# Patient Record
Sex: Male | Born: 1950 | Race: White | Hispanic: No | Marital: Married
Health system: Midwestern US, Community
[De-identification: ages and names within clinical notes are randomized; demographics above are authoritative.]

## PROBLEM LIST (undated history)

## (undated) DIAGNOSIS — R369 Urethral discharge, unspecified: Secondary | ICD-10-CM

## (undated) DIAGNOSIS — K529 Noninfective gastroenteritis and colitis, unspecified: Secondary | ICD-10-CM

## (undated) DIAGNOSIS — I509 Heart failure, unspecified: Secondary | ICD-10-CM

## (undated) DIAGNOSIS — C801 Malignant (primary) neoplasm, unspecified: Secondary | ICD-10-CM

## (undated) DIAGNOSIS — E119 Type 2 diabetes mellitus without complications: Secondary | ICD-10-CM

## (undated) DIAGNOSIS — L03031 Cellulitis of right toe: Secondary | ICD-10-CM

## (undated) DIAGNOSIS — C140 Malignant neoplasm of pharynx, unspecified: Secondary | ICD-10-CM

## (undated) DIAGNOSIS — I4892 Unspecified atrial flutter: Secondary | ICD-10-CM

## (undated) DIAGNOSIS — R360 Urethral discharge without blood: Secondary | ICD-10-CM

## (undated) HISTORY — PX: CORONARY ARTERY BYPASS GRAFT: SHX141

## (undated) HISTORY — DX: Malignant neoplasm of pharynx, unspecified: C14.0

## (undated) HISTORY — DX: Cellulitis of right toe: L03.031

## (undated) HISTORY — DX: Type 2 diabetes mellitus without complications: E11.9

## (undated) HISTORY — PX: TOOTH EXTRACTION: SUR596

## (undated) HISTORY — DX: Heart failure, unspecified: I50.9

---
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
## 1968-02-01 HISTORY — PX: APPENDECTOMY: SHX54

## 2015-02-01 DIAGNOSIS — I2510 Atherosclerotic heart disease of native coronary artery without angina pectoris: Secondary | ICD-10-CM

## 2015-02-01 HISTORY — DX: Atherosclerotic heart disease of native coronary artery without angina pectoris: I25.10

## 2017-06-13 ENCOUNTER — Ambulatory Visit: Admit: 2017-06-13 | Discharge: 2017-06-13 | Payer: MEDICARE | Attending: Family Medicine | Primary: Family Medicine

## 2017-06-13 DIAGNOSIS — I482 Chronic atrial fibrillation, unspecified: Secondary | ICD-10-CM

## 2017-06-13 NOTE — Progress Notes (Signed)
Elkhorn Wallis Mart, M.D.  Family Medicine  [address] [place] [address]  Office : [phone]  Fax : [phone]    06/13/2017    Subjective:  The patient is a 67 y.o. male  who presents to establish care.    Patient Active Problem List   Diagnosis Code   ??? Controlled type 2 diabetes mellitus without complication, without long-term current use of insulin ([place]) E11.9   ??? Chronic atrial fibrillation (HCC) I48.2   ??? Coronary artery disease involving native coronary artery of native heart without angina pectoris I25.10   ??? History of MI (myocardial infarction) I25.2   ??? History of anemia Z86.2   ??? Essential hypertension I10   ??? Muscle weakness M32.67     67 year old male with past medical history significant for diabetes, atrial fibrillation, coronary artery disease status post stent placement April 27, 2017 presents today to establish care.  Patient was in a inpatient rehab in [place] for approximately 1 month prior to moving to [place].  Patient endorses that he has been compliant on all prescribed medications and denies any adverse reactions.    Diabetes: During inpatient hospitalization records show insulin dependence requiring 16 units of basal and 3 units with meals however, patient reports that this regimen has been discontinued to continue taking glipizide, Jardiance.  Patient does not check blood sugars.  Timing of condition is constant.  Relieved with prescription medications.    Coronary artery disease: Status post 5 stent placements (patient reported) earlier this year in March.  Patient endorses consistently taking Plavix, aspirin, metoprolol.  Patient denies any chest pain, shortness of breath, palpitations.  Atrial fibrillation: Patient endorses consistently taking prescribed amiodarone, metoprolol.  Patient denies any palpitations, shortness of breath.    HTN:  Patient does report compliance with his prescribed medications.   Patient does not check BP at home, average BP is w/in goal.  Denies chest pain, shortness of breath, dyspnea on exertion, or edema; no vision changes or neurologic symptoms reported.    Weakness: Patient reports muscle weakness primarily characterize/described as early fatigue with exertion in lower extremities.  Patient still ambulating without difficulty.  Patient drove himself to office visit day without problems.  Denies any numbness/tingling/deficit or recent injury/fall/trauma.    Past Surgical History:   Procedure Laterality Date   ??? CARDIAC SURG PROCEDURE UNLIST     ??? HX APPENDECTOMY     ??? HX HERNIA REPAIR     ??? HX ORTHOPAEDIC          History reviewed. No pertinent family history.     reports that he has never smoked. He has never used smokeless tobacco. He reports that he drinks alcohol. He reports that he does not use drugs.    No Known Allergies  3/28... In rehab for on emonth s/p 5 bipas        Review of Systems:     Review of Systems   Constitutional: Positive for malaise/fatigue.   Respiratory: Negative for cough and shortness of breath.    Cardiovascular: Negative for chest pain and palpitations.   Gastrointestinal: Negative for diarrhea, nausea and vomiting.   Skin: Negative for rash.        Objective:  Visit Vitals  BP 145/90 (BP 1 Location: Left arm, BP Patient Position: Sitting)   Pulse (!) 56   Temp 95.5 ??F (35.3 ??C) (Oral)   Ht 6\' 2"  (1.88 m)   Wt 231 lb (104.8  kg)   BMI 29.66 kg/m??       Physical Exam:  Physical Exam   Constitutional: He is oriented to person, place, and time. He appears well-developed and well-nourished.   HENT:   Head: Normocephalic and atraumatic.   Eyes: Pupils are equal, round, and reactive to light. EOM are normal.   Neck: Normal range of motion.   Cardiovascular: Normal rate, regular rhythm and normal heart sounds.   Pulmonary/Chest: Effort normal and breath sounds normal. No respiratory distress.    Abdominal: Soft. Bowel sounds are normal. There is no tenderness. There is no rebound and no guarding.   Musculoskeletal: Normal range of motion.   Neurological: He is alert and oriented to person, place, and time. No cranial nerve deficit. He exhibits normal muscle tone. Coordination normal.   Skin: No rash noted.   Psychiatric: He has a normal mood and affect. His behavior is normal. Thought content normal.       ASSESSMENT/PLAN:  Diagnoses and all orders for this visit:    1. Chronic atrial fibrillation (HCC)  -     REFERRAL TO CARDIOLOGY  -     COLLECTION VENOUS BLOOD,VENIPUNCTURE    2. Coronary artery disease involving native coronary artery of native heart without angina pectoris  -     REFERRAL TO CARDIOLOGY  -     LIPID PANEL    3. Essential hypertension  -     METABOLIC PANEL, COMPREHENSIVE  -     LIPID PANEL    4. Controlled type 2 diabetes mellitus without complication, without long-term current use of insulin ([place])  Assessment & Plan:  Will assess diabetic control with A1c today.  Continue current regimen for now.  Concerning patient's reported discontinuation of insulin dosing after discharge from rehab facility.  Key Antihyperglycemic Medications             glipiZIDE (GLUCOTROL) 10 mg tablet (Taking)     empagliflozin (JARDIANCE) 25 mg tablet (Taking) Take  by mouth daily.        Other Key Diabetic Medications             atorvastatin (LIPITOR) 40 mg tablet (Taking) Take  by mouth daily.        No results found for: HBA1C, HBA1CPOC, HBA1CEXT, GLU, CREA, CREAPOC, CREATEXT, MALBUR, MCALPOCT, MCACRPOC, MALBCRRATEXT, MCACR, MCAU, MCAU2, MALBEXT, CHOL, CHOLPOCT, HDL, HDLPOC, LDLC, LDL, LDLCEXT, LDLCPOC, TRIGL, TGLPOCT, HBA1CEXT  Diabetic Foot and Eye Exam HM Status   Topic Date Due   ??? Diabetic Foot Care  05/02/1958   ??? Eye Exam  [birth year]/02/1960       Orders:  -     HEMOGLOBIN A1C W/O EAG  -     METABOLIC PANEL, COMPREHENSIVE  -     LIPID PANEL    5. History of anemia  -     CBC WITH AUTOMATED DIFF     6. History of MI (myocardial infarction)  Assessment & Plan:  Will refer to cardiology for continuing management/monitoring of coronary artery disease, chronic atrial fibrillation.    Orders:  -     REFERRAL TO CARDIOLOGY    7. Muscle weakness  Assessment & Plan:  Will refer to physical therapy for muscle strengthening/stretching/exercise.  Additionally, obtain baseline labs including assessment for anemia (history of), A1c, kidney/elect lites.      Orders:  -     REFERRAL TO PHYSICAL THERAPY      Follow-up and Dispositions    ?? Return in about  3 months (around 09/13/2017).           Youlanda Roys. Wallis Mart, MD

## 2017-06-13 NOTE — Assessment & Plan Note (Signed)
Will assess diabetic control with A1c today.  Continue current regimen for now.  Concerning patient's reported discontinuation of insulin dosing after discharge from rehab facility.  Key Antihyperglycemic Medications             glipiZIDE (GLUCOTROL) 10 mg tablet (Taking)     empagliflozin (JARDIANCE) 25 mg tablet (Taking) Take  by mouth daily.        Other Key Diabetic Medications             atorvastatin (LIPITOR) 40 mg tablet (Taking) Take  by mouth daily.        No results found for: HBA1C, HBA1CPOC, HBA1CEXT, GLU, CREA, CREAPOC, CREATEXT, MALBUR, MCALPOCT, MCACRPOC, MALBCRRATEXT, MCACR, MCAU, MCAU2, MALBEXT, CHOL, CHOLPOCT, HDL, HDLPOC, LDLC, LDL, LDLCEXT, LDLCPOC, TRIGL, TGLPOCT, HBA1CEXT  Diabetic Foot and Eye Exam HM Status   Topic Date Due   ??? Diabetic Foot Care  05/02/1958   ??? Eye Exam  [birth year]/02/1960

## 2017-06-13 NOTE — Assessment & Plan Note (Signed)
Will refer to physical therapy for muscle strengthening/stretching/exercise.  Additionally, obtain baseline labs including assessment for anemia (history of), A1c, kidney/elect lites.

## 2017-06-13 NOTE — Progress Notes (Signed)
Patient notified of the following;  A1C has come back at 7.9.  Will not be making any changes to current diabetic medications as many of your medications are new.  Will reasess in 3 months.  Patients metabolic panel, lipid panel, blood counts look reassuring/good.

## 2017-06-13 NOTE — Telephone Encounter (Signed)
Upon checking the patient out today he stated he forgot to mention to you that he has had some swelling in his right foot.

## 2017-06-13 NOTE — Assessment & Plan Note (Signed)
Will refer to cardiology for continuing management/monitoring of coronary artery disease, chronic atrial fibrillation.

## 2017-06-14 LAB — LIPID PANEL
Cholesterol, total: 119 mg/dL (ref 100–199)
HDL Cholesterol: 35 mg/dL — ABNORMAL LOW (ref >39)
LDL, calculated: 66 mg/dL (ref 0–99)
Triglyceride: 92 mg/dL (ref 0–149)
VLDL, calculated: 18 mg/dL (ref 5–40)

## 2017-06-14 LAB — METABOLIC PANEL, COMPREHENSIVE
A-G Ratio: 1.5 (ref 1.2–2.2)
ALT (SGPT): 39 IU/L (ref 0–44)
AST (SGOT): 23 IU/L (ref 0–40)
Albumin: 4.4 g/dL (ref 3.6–4.8)
Alk. phosphatase: 155 IU/L — ABNORMAL HIGH (ref 39–117)
BUN/Creatinine ratio: 18 (ref 10–24)
BUN: 22 mg/dL (ref 8–27)
Bilirubin, total: 0.5 mg/dL (ref 0.0–1.2)
CO2: 23 mmol/L (ref 20–29)
Calcium: 9.8 mg/dL (ref 8.6–10.2)
Chloride: 100 mmol/L (ref 96–106)
Creatinine: 1.24 mg/dL (ref 0.76–1.27)
GFR est AA: 68 mL/min/{1.73_m2} (ref >59)
GFR est non-AA: 59 mL/min/{1.73_m2} — ABNORMAL LOW (ref >59)
GLOBULIN, TOTAL: 2.9 g/dL (ref 1.5–4.5)
Glucose: 115 mg/dL — ABNORMAL HIGH (ref 65–99)
Potassium: 4.5 mmol/L (ref 3.5–5.2)
Protein, total: 7.3 g/dL (ref 6.0–8.5)
Sodium: 142 mmol/L (ref 134–144)

## 2017-06-14 LAB — HEMOGLOBIN A1C W/O EAG: Hemoglobin A1c: 7.9 % — ABNORMAL HIGH (ref 4.8–5.6)

## 2017-06-14 LAB — CBC WITH AUTOMATED DIFF
ABS. BASOPHILS: 0 10*3/uL (ref 0.0–0.2)
ABS. EOSINOPHILS: 0.3 10*3/uL (ref 0.0–0.4)
ABS. IMM. GRANS.: 0 10*3/uL (ref 0.0–0.1)
ABS. MONOCYTES: 0.5 10*3/uL (ref 0.1–0.9)
ABS. NEUTROPHILS: 3.5 10*3/uL (ref 1.4–7.0)
Abs Lymphocytes: 0.9 10*3/uL (ref 0.7–3.1)
BASOPHILS: 1 %
EOSINOPHILS: 6 %
HCT: 42.7 % (ref 37.5–51.0)
HGB: 13.7 g/dL (ref 13.0–17.7)
IMMATURE GRANULOCYTES: 0 %
Lymphocytes: 17 %
MCH: 28.4 pg (ref 26.6–33.0)
MCHC: 32.1 g/dL (ref 31.5–35.7)
MCV: 88 fL (ref 79–97)
MONOCYTES: 9 %
NEUTROPHILS: 67 %
PLATELET: 250 10*3/uL (ref 150–379)
RBC: 4.83 x10E6/uL (ref 4.14–5.80)
RDW: 14.5 % (ref 12.3–15.4)
WBC: 5.2 10*3/uL (ref 3.4–10.8)

## 2017-06-14 NOTE — Telephone Encounter (Signed)
Spoke with pt and he stated that the foot swelling had resolved and he no longer needed to discuss the issue. Pt was notified that if the swelling came back to call and let us know so we could schedule him an apt. Pt voiced understanding and was appreciative of call

## 2017-06-20 ENCOUNTER — Ambulatory Visit
Admit: 2017-06-20 | Discharge: 2017-06-20 | Payer: MEDICARE | Attending: Cardiovascular Disease | Primary: Family Medicine

## 2017-06-20 ENCOUNTER — Ambulatory Visit: Attending: Cardiovascular Disease | Primary: Family Medicine

## 2017-06-20 DIAGNOSIS — I252 Old myocardial infarction: Secondary | ICD-10-CM

## 2017-06-20 MED ORDER — SILDENAFIL 20 MG TAB
20 mg | ORAL_TABLET | Freq: Every day | ORAL | 11 refills | Status: AC | PRN
Start: 2017-06-20 — End: ?

## 2017-06-20 NOTE — Progress Notes (Signed)
[facility]  [address], [address]  [place] [address]  PHONE: [phone]    NAME: Jermaine Romero  DOB: 01-12-1951   HPI  Jermaine Romero is a 67 y.o. male seen for a follow up visit regarding   Referral / Consult (Chronic Atrial Fribrillation [facility]))     He is here in f/u on his CAD.  He has a history of CAD with NSTEMI earlier this year. He ended up with an NSTEMI in [place] and ended up with CABG at [facility] .  He has been doing pretty well since his CABG. He has been doing some walking for exercise.  He has been limited by finances and apparently has an outstanding traffic ticket in [place]   dates back to just prior to his surgery .         Past Medical History, Past Surgical History, Family history, Social History, and Medications were all reviewed with the patient today and updated as necessary.     Outpatient Medications Marked as Taking for the 06/20/17 encounter (Office Visit) with Fenton Foy, MD   Medication Sig Dispense Refill   ??? insulin aspart U-100 (NOVOLOG) 100 unit/mL (3 mL) inpn by SubCUTAneous route.     ??? insulin detemir U-100 (LEVEMIR FLEXTOUCH) 100 unit/mL (3 mL) inpn by SubCUTAneous route.     ??? sildenafil, antihypertensive, (REVATIO) 20 mg tablet Take 1 Tab by mouth daily as needed (ed). 1-5 tabs for erectile dysfunction 30 Tab 11   ??? glipiZIDE (GLUCOTROL) 10 mg tablet Take 10 mg by mouth daily.  0   ??? atorvastatin (LIPITOR) 40 mg tablet Take  by mouth daily.     ??? clopidogrel (PLAVIX) 75 mg tab Take 75 mg by mouth nightly.     ??? ondansetron hcl (ZOFRAN) 4 mg tablet Take 4 mg by mouth every eight (8) hours as needed for Nausea.     ??? [DISCONTINUED] metoprolol tartrate (LOPRESSOR) 25 mg tablet Take  by mouth two (2) times a day.       Patient Active Problem List    Diagnosis   ??? Tongue cancer ([place])   ??? Paroxysmal atrial fibrillation (HCC)   ??? Coronary artery disease involving native coronary artery of native heart without angina pectoris    ??? History of MI (myocardial infarction)   ??? History of anemia   ??? Essential hypertension   ??? Muscle weakness     No Known Allergies  Past Medical History:   Diagnosis Date   ??? CAD (coronary artery disease)    ??? 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 CARDIAC SURG PROCEDURE UNLIST     ??? HX APPENDECTOMY     ??? HX HERNIA REPAIR     ??? HX ORTHOPAEDIC       No family history on file.   Social History     Tobacco Use   ??? Smoking status: Never Smoker   ??? Smokeless tobacco: Never Used   Substance Use Topics   ??? Alcohol use: Yes     Frequency: Monthly or less     Drinks per session: 1 or 2     Binge frequency: Never   moio        Review of Systems   Constitutional: Negative for weight loss.   HENT: Positive for hearing loss.    Eyes: Negative for blurred vision.   Respiratory: Negative  for shortness of breath.    Cardiovascular: Negative for chest pain and palpitations.   Gastrointestinal: Negative for abdominal pain.   Genitourinary:        Erectile dysfunction     Neurological: Negative for loss of consciousness.             Wt Readings from Last 3 Encounters:   06/20/17 235 lb (106.6 kg)   06/13/17 231 lb (104.8 kg)     BP Readings from Last 3 Encounters:   06/20/17 140/84   06/13/17 145/90       Visit Vitals  BP 140/84   Pulse 63   Ht _0  (1.88 m)   Wt 235 lb (106.6 kg) Comment: with shoes   BMI 30.17 kg/m??         Physical Exam   Constitutional: He appears well-developed and well-nourished.   HENT:   Head: Normocephalic.   Neck: No JVD present. Carotid bruit is not present.   Cardiovascular: Normal rate, regular rhythm and normal heart sounds.   Pulmonary/Chest: Effort normal and breath sounds normal.   Abdominal: Soft.   Musculoskeletal: He exhibits no edema.   Neurological: He is alert.   Skin: Skin is warm and dry.   Psychiatric: He has a normal mood and affect.   Atrial  Rhythm  -First degree A-V block   P:QRS - 1:1, Abnormal P axis, H Rate 63   PRi = 262   -Left axis.    -  Nonspecific T-abnormality.    Low voltage -possible pulmonary disease.     Medical problems and test results were reviewed with the patient today.     No results found for any visits on 06/20/17.      Lab Results   Component Value Date/Time    Hemoglobin A1c 7.9 (H) 06/13/2017 09:12 AM       Lab Results   Component Value Date/Time    WBC 5.2 06/13/2017 09:12 AM    HGB 13.7 06/13/2017 09:12 AM    HCT 42.7 06/13/2017 09:12 AM    PLATELET 250 06/13/2017 09:12 AM    MCV 88 06/13/2017 09:12 AM       Lab Results   Component Value Date/Time    Sodium 142 06/13/2017 09:12 AM    Potassium 4.5 06/13/2017 09:12 AM    Chloride 100 06/13/2017 09:12 AM    CO2 23 06/13/2017 09:12 AM    Glucose 115 (H) 06/13/2017 09:12 AM    BUN 22 06/13/2017 09:12 AM    Creatinine 1.24 06/13/2017 09:12 AM    BUN/Creatinine ratio 18 06/13/2017 09:12 AM    GFR est AA 68 06/13/2017 09:12 AM    GFR est non-AA 59 (L) 06/13/2017 09:12 AM    Calcium 9.8 06/13/2017 09:12 AM       Lab Results   Component Value Date/Time    ALT (SGPT) 39 06/13/2017 09:12 AM    AST (SGOT) 23 06/13/2017 09:12 AM    Alk. phosphatase 155 (H) 06/13/2017 09:12 AM    Bilirubin, total 0.5 06/13/2017 09:12 AM       Lab Results   Component Value Date/Time    Cholesterol, total 119 06/13/2017 09:12 AM    HDL Cholesterol 35 (L) 06/13/2017 09:12 AM    LDL, calculated 66 06/13/2017 09:12 AM    VLDL, calculated 18 06/13/2017 09:12 AM    Triglyceride 92 06/13/2017 09:12 AM         ASSESSMENT and PLAN    Diagnoses and  all orders for this visit:    1. History of MI (myocardial infarction)  Comments:  still very weak from surgery   cannot afford rehab or travel to and from     2. Essential hypertension  -     AMB POC EKG ROUTINE W/ 12 LEADS, INTER & REP  -     ECHO COMPLETE STUDY; Future    3. Coronary artery disease involving native coronary artery of native heart without angina pectoris  -     ECHO COMPLETE STUDY; Future     4. Controlled type 2 diabetes mellitus without complication, without long-term current use of insulin (HCC)    5. Paroxysmal atrial fibrillation (HCC)  Comments:  post op  will stop amiodarone now 2 months out     6. Erectile dysfunction, unspecified erectile dysfunction type  -     sildenafil, antihypertensive, (REVATIO) 20 mg tablet; Take 1 Tab by mouth daily as needed (ed). 1-5 tabs for erectile dysfunction     Thank you very much for your kind referral of this pleasant patient .  Please do not hesitate to call me  [phone] with any questions or suggestions.                Follow-up and Dispositions    ?? Return in about 6 weeks (around 08/01/2017).             AHIJAH DEVERY, MD5/21/2019  4:29 PM

## 2017-06-20 NOTE — Addendum Note (Signed)
Addended by: Alycia Patten on: 06/21/2017 11:08 AM     Modules accepted: Level of Service

## 2017-06-20 NOTE — Addendum Note (Signed)
Addendum  Note by Alycia Patten at 06/20/17 1430                Author: Sueanne Margarita S  Service: --  Author Type: --       Filed: 06/21/17 1108  Encounter Date: 06/20/2017  Status: Signed          Editor: Sueanne Margarita S          Addended by: Alycia Patten on: 06/21/2017 11:08 AM    Modules accepted: Level of Service

## 2017-06-20 NOTE — Progress Notes (Signed)
Progress  Notes by Fenton Foy, MD at 06/20/17 1430                Author: Fenton Foy, MD  Service: --  Author Type: Physician       Filed: 06/20/17 1747  Encounter Date: 06/20/2017  Status: Signed          Editor: Fenton Foy, MD (Physician)                         [facility]   [address], [address]   [place] [address]   PHONE: [phone]      NAME: Jermaine Romero   DOB: 1950/06/30       HPI   Eliyohu Class is a 67 y.o.  male seen for a follow up visit regarding    Referral / Consult (Chronic Atrial Fribrillation [facility]))      He is here in f/u on his CAD.  He has a history of CAD with NSTEMI earlier this year. He ended up with an NSTEMI in [place] and ended up with CABG at [facility] .  He has been doing pretty well  since his CABG. He has been doing some walking for exercise.  He has been limited by finances and apparently has an outstanding traffic ticket in [place]    dates back to just prior to his surgery .            Past Medical History, Past Surgical History, Family history, Social History, and Medications were all reviewed with the patient today and updated as necessary.         Outpatient Medications Marked as Taking for the 06/20/17 encounter (Office Visit) with Fenton Foy, MD          Medication  Sig  Dispense  Refill           ?  insulin aspart U-100 (NOVOLOG) 100 unit/mL (3 mL) inpn  by SubCUTAneous route.         ?  insulin detemir U-100 (LEVEMIR FLEXTOUCH) 100 unit/mL (3 mL) inpn  by SubCUTAneous route.         ?  sildenafil, antihypertensive, (REVATIO) 20 mg tablet  Take 1 Tab by mouth daily as needed (ed). 1-5 tabs for erectile dysfunction  30 Tab  11     ?  glipiZIDE (GLUCOTROL) 10 mg tablet  Take 10 mg by mouth daily.    0     ?  atorvastatin (LIPITOR) 40 mg tablet  Take  by mouth daily.         ?  clopidogrel (PLAVIX) 75 mg tab  Take 75 mg by mouth nightly.         ?  ondansetron hcl (ZOFRAN) 4 mg tablet  Take 4 mg by mouth every eight (8) hours as needed  for Nausea.               ?  [DISCONTINUED] metoprolol tartrate (LOPRESSOR) 25 mg tablet  Take  by mouth two (2) times a day.              Patient Active Problem List          Diagnosis        ?  Tongue cancer ([place])     ?  Paroxysmal atrial fibrillation (HCC)     ?  Coronary artery disease involving native coronary artery of native heart without angina pectoris     ?  History of MI (myocardial infarction)     ?  History of anemia     ?  Essential hypertension        ?  Muscle weakness        No Known Allergies     Past Medical History:        Diagnosis  Date         ?  CAD (coronary artery disease)       ?  Cancer ([place])            Throat and tounge          Past Surgical History:         Procedure  Laterality  Date          ?  CARDIAC SURG PROCEDURE UNLIST         ?  HX APPENDECTOMY         ?  HX HERNIA REPAIR              ?  HX ORTHOPAEDIC            No family history on file.      Social History          Tobacco Use         ?  Smoking status:  Never Smoker     ?  Smokeless tobacco:  Never Used       Substance Use Topics         ?  Alcohol use:  Yes              Frequency:  Monthly or less         Drinks per session:  1 or 2              Binge frequency:  Never     moio            Review of Systems    Constitutional: Negative for weight loss.    HENT: Positive for hearing loss.     Eyes: Negative for blurred vision.    Respiratory: Negative for shortness of breath.     Cardiovascular: Negative for chest pain and palpitations.    Gastrointestinal: Negative for abdominal pain.    Genitourinary:         Erectile dysfunction       Neurological: Negative for loss of consciousness.                       Wt Readings from Last 3 Encounters:        06/20/17  235 lb (106.6 kg)        06/13/17  231 lb (104.8 kg)          BP Readings from Last 3 Encounters:        06/20/17  140/84        06/13/17  145/90           Visit Vitals      BP  140/84     Pulse  63     Ht  '6\' 2"'  (1.88 m)         Wt  235 lb (106.6 kg)  Comment: with  shoes        BMI  30.17 kg/m??              Physical Exam    Constitutional: He appears well-developed and well-nourished.    HENT:    Head: Normocephalic.  Neck: No JVD present. Carotid bruit is not present.    Cardiovascular: Normal rate, regular rhythm and normal heart sounds.    Pulmonary/Chest: Effort normal and breath sounds normal.    Abdominal: Soft.    Musculoskeletal: He exhibits no edema.   Neurological: He is alert.    Skin: Skin is warm and dry.   Psychiatric: He has a normal mood and affect.    Atrial  Rhythm  -First degree A-V block    P:QRS - 1:1, Abnormal P axis, H Rate 63    PRi = 262   -Left axis.     -  Nonspecific T-abnormality.     Low voltage -possible pulmonary disease.       Medical problems and test results were reviewed with the patient today.       No results found for any visits on 06/20/17.           Lab Results         Component  Value  Date/Time            Hemoglobin A1c  7.9 (H)  06/13/2017 09:12 AM             Lab Results         Component  Value  Date/Time            WBC  5.2  06/13/2017 09:12 AM       HGB  13.7  06/13/2017 09:12 AM       HCT  42.7  06/13/2017 09:12 AM       PLATELET  250  06/13/2017 09:12 AM            MCV  88  06/13/2017 09:12 AM             Lab Results         Component  Value  Date/Time            Sodium  142  06/13/2017 09:12 AM       Potassium  4.5  06/13/2017 09:12 AM       Chloride  100  06/13/2017 09:12 AM       CO2  23  06/13/2017 09:12 AM       Glucose  115 (H)  06/13/2017 09:12 AM       BUN  22  06/13/2017 09:12 AM       Creatinine  1.24  06/13/2017 09:12 AM       BUN/Creatinine ratio  18  06/13/2017 09:12 AM       GFR est AA  68  06/13/2017 09:12 AM       GFR est non-AA  59 (L)  06/13/2017 09:12 AM            Calcium  9.8  06/13/2017 09:12 AM             Lab Results         Component  Value  Date/Time            ALT (SGPT)  39  06/13/2017 09:12 AM       AST (SGOT)  23  06/13/2017 09:12 AM       Alk. phosphatase  155 (H)  06/13/2017 09:12 AM             Bilirubin, total  0.5  06/13/2017 09:12 AM             Lab Results  Component  Value  Date/Time            Cholesterol, total  119  06/13/2017 09:12 AM       HDL Cholesterol  35 (L)  06/13/2017 09:12 AM       LDL, calculated  66  06/13/2017 09:12 AM       VLDL, calculated  18  06/13/2017 09:12 AM            Triglyceride  92  06/13/2017 09:12 AM              ASSESSMENT and PLAN      Diagnoses and all orders for this visit:      1. History of MI (myocardial infarction)   Comments:   still very weak from surgery   cannot afford rehab or travel to and from       2. Essential hypertension   -     AMB POC EKG ROUTINE W/ 12 LEADS, INTER & REP   -     ECHO COMPLETE STUDY; Future      3. Coronary artery disease involving native coronary artery of native heart without angina pectoris   -     ECHO COMPLETE STUDY; Future      4. Controlled type 2 diabetes mellitus without complication, without long-term current use of insulin (HCC)      5. Paroxysmal atrial fibrillation (HCC)   Comments:   post op  will stop amiodarone now 2 months out       6. Erectile dysfunction, unspecified erectile dysfunction type   -     sildenafil, antihypertensive, (REVATIO) 20 mg tablet; Take 1 Tab by mouth daily as needed (ed). 1-5 tabs for erectile dysfunction       Thank you very much for your kind referral of this pleasant patient .  Please do not hesitate to call me  [phone] with any questions or suggestions.                               Follow-up and Dispositions      ??  Return in about 6 weeks (around 08/01/2017).                         Fenton Foy, MD   06/20/2017   4:29 PM

## 2017-07-11 ENCOUNTER — Encounter: Payer: MEDICARE | Primary: Family Medicine

## 2017-07-12 MED ORDER — CLOPIDOGREL 75 MG TAB
75 mg | ORAL_TABLET | Freq: Every evening | ORAL | 1 refills | Status: AC
Start: 2017-07-12 — End: ?

## 2017-07-12 MED ORDER — GLIPIZIDE 10 MG TAB
10 mg | ORAL_TABLET | Freq: Every day | ORAL | 1 refills | Status: DC
Start: 2017-07-12 — End: 2017-08-01

## 2017-07-12 MED ORDER — ATORVASTATIN 40 MG TAB
40 mg | ORAL_TABLET | Freq: Every day | ORAL | 1 refills | Status: AC
Start: 2017-07-12 — End: ?

## 2017-07-12 NOTE — Telephone Encounter (Signed)
Informed patient that Dr. Wallis Mart had sent in the prescriptions for the medications requested.

## 2017-07-12 NOTE — Telephone Encounter (Signed)
Patient called requesting refills on the following medications:    Atorvastatin 40mg   Glipizide 10mg   Clopidogrel 75mg .    Patient is newer to Dr. Omer Jack care and has not had these prescriptions written by him previously.    Patient has an appt scheduled with Dr. Wallis Mart on 09/14/2017.

## 2017-07-25 NOTE — Telephone Encounter (Addendum)
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 Pt has appointment to have DOT physical tomorrow.   Please fax to : [phone]

## 2017-07-25 NOTE — Telephone Encounter (Signed)
How is he doing     I need to see him back . He was weak last visit early post surgery  and needed letter saying he could not return to [place] to sit for a parking ticket court case ?

## 2017-07-26 NOTE — Telephone Encounter (Signed)
I called and explained Dr.Cebe's response. He voiced understanding and stated he forgot he had an upcoming appointment on 08/01/17. 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

## 2017-08-01 ENCOUNTER — Ambulatory Visit
Admit: 2017-08-01 | Discharge: 2017-08-01 | Payer: MEDICARE | Attending: Cardiovascular Disease | Primary: Family Medicine

## 2017-08-01 ENCOUNTER — Ambulatory Visit: Attending: Cardiovascular Disease | Primary: Family Medicine

## 2017-08-01 DIAGNOSIS — I2510 Atherosclerotic heart disease of native coronary artery without angina pectoris: Secondary | ICD-10-CM

## 2017-08-01 MED ORDER — RAMIPRIL 10 MG CAP
10 mg | ORAL_CAPSULE | Freq: Every day | ORAL | 11 refills | Status: AC
Start: 2017-08-01 — End: ?

## 2017-08-01 NOTE — Telephone Encounter (Signed)
Pt wanted to come on for appt with dr.cebe.  Said he didn't know anything about echo appt

## 2017-08-01 NOTE — Progress Notes (Signed)
[facility]  [address], [address]  [place] [address]  PHONE: [phone]    NAME: Jermaine Romero  DOB: 01/20/1951   HPI  Jermaine Romero is a 67 y.o. 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 He did not get his echo yet.  He is walking daily and doing a good bit better.  He is joining the [employer].    He is cleared to go back to work   No chest pain or dyspnea.         Past Medical History, Past Surgical History, Family history, Social History, and Medications were all reviewed with the patient today and updated as necessary.     Outpatient Medications Marked as Taking for the 08/01/17 encounter (Office Visit) with Fenton Foy, MD   Medication Sig Dispense Refill   ??? ramipril (ALTACE) 10 mg capsule Take 1 Cap by mouth daily. 30 Cap 11   ??? clopidogrel (PLAVIX) 75 mg tab Take 1 Tab by mouth nightly. 30 Tab 1   ??? atorvastatin (LIPITOR) 40 mg tablet Take 1 Tab by mouth daily. 30 Tab 1   ??? OTHER,NON-FORMULARY, Take 2 Caps by mouth daily. Indications: T5RX Testosterone Booster     ??? OTHER,NON-FORMULARY, Take 2 Caplet by mouth daily. Indications: Extra Emergency planning/management officer Advanced with Kenard Gower     ??? OTHER,NON-FORMULARY, Take 1 Cap by mouth daily. Indications: [employer] Men's Yohimbe 451 Dietary Supplement     ??? OTHER,NON-FORMULARY, Take 2 Caplet by mouth daily. Indications: Nitric Muscle Uptake Maximum Strength Formula     ??? OTHER,NON-FORMULARY, Take 2 Caps by mouth daily. Indications: Perform XT Testosterone Support     ??? OTHER,NON-FORMULARY, Take 2 Caplet by mouth daily. Indications: Super Beta Prostate P3 Advanced Prostate Support Complex     ??? sildenafil, antihypertensive, (REVATIO) 20 mg tablet Take 1 Tab by mouth daily as needed (ed). 1-5 tabs for erectile dysfunction 30 Tab 11     Patient Active Problem List    Diagnosis   ??? Other hyperlipidemia    ??? Tongue cancer ([place])     THROAT CANCER   EXCISION AND RADIATION CHEMO 2017      ??? Hx of CABG     04/18/17  [facility]  Dr Soyla Murphy   CABGx5   atretic LIMA proximal   distal LIMA free graft to LAD via SVG to circumflex pedicle SVG D1 SVG D2 via hood SVG  D1  SVG OM distal SVG LPL   EF50% lateral scar   LAA LIGATION 35MM ATRICLIP      ??? Paroxysmal atrial fibrillation ([place])   ??? Coronary artery disease involving native coronary artery of native heart without angina pectoris   ??? History of MI (myocardial infarction)   ??? History of anemia   ??? Essential hypertension   ??? Muscle weakness     No Known Allergies  Past Medical History:   Diagnosis Date   ??? CAD (coronary artery disease)    ??? Cancer ([place])     Throat and tounge     Past Surgical History:   Procedure Laterality Date   ??? CARDIAC SURG PROCEDURE UNLIST     ??? HX APPENDECTOMY     ??? HX HERNIA REPAIR     ??? HX ORTHOPAEDIC  No family history on file.   Social History     Tobacco Use   ??? Smoking status: Never Smoker   ??? Smokeless tobacco: Never Used   Substance Use Topics   ??? Alcohol use: Yes     Frequency: Monthly or less     Drinks per session: 1 or 2     Binge frequency: Never           ROS          Wt Readings from Last 3 Encounters:   08/01/17 247 lb (112 kg)   06/20/17 235 lb (106.6 kg)   06/13/17 231 lb (104.8 kg)     BP Readings from Last 3 Encounters:   08/01/17 136/90   06/20/17 140/84   06/13/17 145/90       Visit Vitals  BP 136/90   Pulse 72   Ht '6\' 2"'  (1.88 m)   Wt 247 lb (112 kg) Comment: with shoes   BMI 31.71 kg/m??         Physical Exam   Constitutional: He appears well-developed and well-nourished.   HENT:   Head: Normocephalic.   Neck: No JVD present. Carotid bruit is not present.   Cardiovascular: Normal rate, regular rhythm and normal heart sounds.   Pulmonary/Chest: Effort normal and breath sounds normal.   Abdominal: Soft.   Musculoskeletal: He exhibits no edema.   Neurological: He is alert.   Skin: Skin is warm and dry.    Psychiatric: He has a normal mood and affect.       Medical problems and test results were reviewed with the patient today.     No results found for any visits on 08/01/17.      Lab Results   Component Value Date/Time    Hemoglobin A1c 7.9 (H) 06/13/2017 09:12 AM       Lab Results   Component Value Date/Time    WBC 5.2 06/13/2017 09:12 AM    HGB 13.7 06/13/2017 09:12 AM    HCT 42.7 06/13/2017 09:12 AM    PLATELET 250 06/13/2017 09:12 AM    MCV 88 06/13/2017 09:12 AM       Lab Results   Component Value Date/Time    Sodium 142 06/13/2017 09:12 AM    Potassium 4.5 06/13/2017 09:12 AM    Chloride 100 06/13/2017 09:12 AM    CO2 23 06/13/2017 09:12 AM    Glucose 115 (H) 06/13/2017 09:12 AM    BUN 22 06/13/2017 09:12 AM    Creatinine 1.24 06/13/2017 09:12 AM    BUN/Creatinine ratio 18 06/13/2017 09:12 AM    GFR est AA 68 06/13/2017 09:12 AM    GFR est non-AA 59 (L) 06/13/2017 09:12 AM    Calcium 9.8 06/13/2017 09:12 AM       Lab Results   Component Value Date/Time    ALT (SGPT) 39 06/13/2017 09:12 AM    AST (SGOT) 23 06/13/2017 09:12 AM    Alk. phosphatase 155 (H) 06/13/2017 09:12 AM    Bilirubin, total 0.5 06/13/2017 09:12 AM       Lab Results   Component Value Date/Time    Cholesterol, total 119 06/13/2017 09:12 AM    HDL Cholesterol 35 (L) 06/13/2017 09:12 AM    LDL, calculated 66 06/13/2017 09:12 AM    VLDL, calculated 18 06/13/2017 09:12 AM    Triglyceride 92 06/13/2017 09:12 AM         ASSESSMENT and PLAN    Diagnoses and all  orders for this visit:    1. Coronary artery disease involving native coronary artery of native heart without angina pectoris  Comments:  stable with no limitations post CABG.  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    2. Essential hypertension  Comments:  will start ACE -  post op Ramipril   Orders:   -     ramipril (ALTACE) 10 mg capsule; Take 1 Cap by mouth daily.    3. 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                 TARIQ PERNELL, MD7/03/2017  11:42 AM

## 2017-08-01 NOTE — Progress Notes (Signed)
Progress  Notes by Fenton Foy, MD at 08/01/17 1100                Author: Fenton Foy, MD  Service: --  Author Type: Physician       Filed: 08/01/17 1156  Encounter Date: 08/01/2017  Status: Signed          Editor: Fenton Foy, MD (Physician)                         [facility]   [address], [address]   [place] [address]   PHONE: [phone]      NAME: Jermaine Romero   DOB: 09/16/1950       HPI   Jermaine Romero is a 67 y.o.  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 He did not get his echo yet.  He is walking daily and doing a good bit better.  He is joining the [employer].    He is cleared to go back to work    No chest pain or dyspnea.             Past Medical History, Past Surgical History, Family history, Social History, and Medications were all reviewed with the patient today and updated as necessary.         Outpatient Medications Marked as Taking for the 08/01/17 encounter (Office Visit) with Fenton Foy, MD          Medication  Sig  Dispense  Refill           ?  ramipril (ALTACE) 10 mg capsule  Take 1 Cap by mouth daily.  30 Cap  11     ?  clopidogrel (PLAVIX) 75 mg tab  Take 1 Tab by mouth nightly.  30 Tab  1     ?  atorvastatin (LIPITOR) 40 mg tablet  Take 1 Tab by mouth daily.  30 Tab  1     ?  OTHER,NON-FORMULARY,  Take 2 Caps by mouth daily. Indications: T5RX Testosterone Booster         ?  OTHER,NON-FORMULARY,  Take 2 Caplet by mouth daily. Indications: Extra Emergency planning/management officer Advanced with Kenard Gower         ?  OTHER,NON-FORMULARY,  Take 1 Cap by mouth daily. Indications: [employer] Men's Yohimbe 451 Dietary Supplement               ?  OTHER,NON-FORMULARY,  Take 2 Caplet by mouth daily. Indications: Nitric Muscle Uptake Maximum Strength Formula               ?  OTHER,NON-FORMULARY,  Take 2 Caps by mouth daily. Indications: Perform XT  Testosterone Support         ?  OTHER,NON-FORMULARY,  Take 2 Caplet by mouth daily. Indications: Super Beta Prostate P3 Advanced Prostate Support Complex               ?  sildenafil, antihypertensive, (REVATIO) 20 mg tablet  Take 1 Tab by mouth daily as needed (ed). 1-5 tabs for erectile dysfunction  30 Tab  11          Patient Active Problem List          Diagnosis        ?  Other hyperlipidemia     ?  Tongue cancer [facility])             THROAT CANCER   EXCISION AND RADIATION CHEMO 2017            ?  Hx of CABG             04/18/17  [facility]  Dr Soyla Murphy   CABGx5   atretic LIMA proximal   distal LIMA free graft to LAD via SVG to circumflex pedicle SVG D1 SVG D2  via hood SVG  D1  SVG OM distal SVG LPL    EF50% lateral scar   LAA LIGATION 35MM ATRICLIP            ?  Paroxysmal atrial fibrillation ([facility])     ?  Coronary artery disease involving native coronary artery of native heart without angina pectoris     ?  History of MI (myocardial infarction)     ?  History of anemia     ?  Essential hypertension        ?  Muscle weakness        No Known Allergies     Past Medical History:        Diagnosis  Date         ?  CAD (coronary artery disease)       ?  Cancer ([place])            Throat and tounge          Past Surgical History:         Procedure  Laterality  Date          ?  CARDIAC SURG PROCEDURE UNLIST         ?  HX APPENDECTOMY         ?  HX HERNIA REPAIR              ?  HX ORTHOPAEDIC            No family history on file.      Social History          Tobacco Use         ?  Smoking status:  Never Smoker     ?  Smokeless tobacco:  Never Used       Substance Use Topics         ?  Alcohol use:  Yes              Frequency:  Monthly or less         Drinks per session:  1 or 2              Binge frequency:  Never                 ROS                   Wt Readings from Last 3 Encounters:        08/01/17  247 lb (112 kg)     06/20/17  235 lb (106.6 kg)        06/13/17  231 lb (104.8 kg)          BP Readings from Last  3 Encounters:        08/01/17  136/90     06/20/17  140/84        06/13/17  145/90  Visit Vitals      BP  136/90     Pulse  72     Ht  6' 2" (1.88 m)         Wt  247 lb (112 kg)  Comment: with shoes        BMI  31.71 kg/m??              Physical Exam    Constitutional: He appears well-developed and well-nourished.    HENT:    Head: Normocephalic.    Neck: No JVD present. Carotid bruit is not present.    Cardiovascular: Normal rate, regular rhythm and normal heart sounds.    Pulmonary/Chest: Effort normal and breath sounds normal.    Abdominal: Soft.    Musculoskeletal: He exhibits no edema.   Neurological: He is alert.    Skin: Skin is warm and dry.   Psychiatric: He has a normal mood and affect.          Medical problems and test results were reviewed with the patient today.       No results found for any visits on 08/01/17.           Lab Results         Component  Value  Date/Time            Hemoglobin A1c  7.9 (H)  06/13/2017 09:12 AM             Lab Results         Component  Value  Date/Time            WBC  5.2  06/13/2017 09:12 AM       HGB  13.7  06/13/2017 09:12 AM       HCT  42.7  06/13/2017 09:12 AM       PLATELET  250  06/13/2017 09:12 AM            MCV  88  06/13/2017 09:12 AM             Lab Results         Component  Value  Date/Time            Sodium  142  06/13/2017 09:12 AM       Potassium  4.5  06/13/2017 09:12 AM       Chloride  100  06/13/2017 09:12 AM       CO2  23  06/13/2017 09:12 AM       Glucose  115 (H)  06/13/2017 09:12 AM       BUN  22  06/13/2017 09:12 AM       Creatinine  1.24  06/13/2017 09:12 AM       BUN/Creatinine ratio  18  06/13/2017 09:12 AM       GFR est AA  68  06/13/2017 09:12 AM       GFR est non-AA  59 (L)  06/13/2017 09:12 AM            Calcium  9.8  06/13/2017 09:12 AM             Lab Results         Component  Value  Date/Time            ALT (SGPT)  39  06/13/2017 09:12 AM       AST (SGOT)  23  06/13/2017 09:12 AM       Alk. phosphatase  155 (H)  06/13/2017 09:12  AM            Bilirubin, total  0.5  06/13/2017 09:12 AM             Lab Results         Component  Value  Date/Time            Cholesterol, total  119  06/13/2017 09:12 AM       HDL Cholesterol  35 (L)  06/13/2017 09:12 AM       LDL, calculated  66  06/13/2017 09:12 AM       VLDL, calculated  18  06/13/2017 09:12 AM            Triglyceride  92  06/13/2017 09:12 AM              ASSESSMENT and PLAN      Diagnoses and all orders for this visit:      1. Coronary artery disease involving native coronary artery of native heart without angina pectoris   Comments:   stable with no limitations post CABG.  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      2. Essential hypertension   Comments:   will start ACE -  post op Ramipril    Orders:   -     ramipril (ALTACE) 10 mg capsule; Take 1 Cap by mouth daily.      3. 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                            Fenton Foy, MD   08/01/2017   11:42 AM

## 2017-08-08 ENCOUNTER — Ambulatory Visit: Admit: 2017-08-08 | Discharge: 2017-08-08 | Payer: MEDICARE | Attending: Family Medicine | Primary: Family Medicine

## 2017-08-08 ENCOUNTER — Ambulatory Visit: Attending: Family Medicine | Primary: Family Medicine

## 2017-08-08 DIAGNOSIS — I10 Essential (primary) hypertension: Secondary | ICD-10-CM

## 2017-08-08 MED ORDER — EMPAGLIFLOZIN 25 MG TABLET
25 mg | ORAL_TABLET | Freq: Every day | ORAL | 0 refills | Status: AC
Start: 2017-08-08 — End: ?

## 2017-08-08 NOTE — Assessment & Plan Note (Signed)
Uncontrolled.  Unknown compliance this patient is not familiar with current medications.  Will call [facility] pharmacy to reconcile medications.  Additionally, recommended patient take currently prescribed ramipril.  Counseled on salt restriction and diet/exercise.

## 2017-08-08 NOTE — Assessment & Plan Note (Signed)
Will refer to ENT for continued monitoring/evaluation/management.  No alarming findings or historical endorsements noted today.  We will additionally attempt to obtain ENT records

## 2017-08-08 NOTE — Progress Notes (Signed)
[place] Wallis Mart, M.D.  Family Medicine  [address] [place] [address]  Office : [phone]  Fax : [phone]    08/08/2017  Subjective:  The patient is a 67 y.o. male who presents for a problem visit.    Chief Complaint   Patient presents with   ??? Dysphagia     Pt c/o dysphagia, has hx of throat cancer.    ??? Headache     Pt c/o headaches when coughing     Dysphasia: Patient reports over the past several weeks that he has been experiencing dysphagia characterized/described as "food getting stuck".  Patient denies any weight loss, night sweats, throat pain.  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  Timing of patient's concern is intermittent.  Denies any known exacerbating factors.  Has not done or taken anything for relief.    HTN:  Patient does report compliance with his prescribed medications.  Patient does not check BP at home, average BP is not w/in goal.  Denies chest pain, shortness of breath, dyspnea on exertion, or edema; no vision changes or neurologic symptoms reported.  Patient presents today unaware of medications being taken.  Does not know if he has been taking his blood pressure medication.    Otherwise, patient endorses compliant with current medication regimens and denies any adverse reactions.  Patient has no new concerns/complaints today.      Past Medical History:   Diagnosis Date   ??? CAD (coronary artery disease)    ??? Cancer ([place])     Throat and tounge       Social History     Socioeconomic History   ??? Marital status: MARRIED     Spouse name: Not on file   ??? Number of children: Not on file   ??? Years of education: Not on file   ??? Highest education level: Not on file   Occupational History   ??? Not on file   Social Needs   ??? Financial resource strain: Not on file   ??? Food insecurity:     Worry: Not on file     Inability: Not on file   ??? Transportation needs:      Medical: Not on file     Non-medical: Not on file   Tobacco Use   ??? Smoking status: Never Smoker   ??? Smokeless tobacco: Never Used   Substance and Sexual Activity   ??? Alcohol use: Yes     Frequency: Monthly or less     Drinks per session: 1 or 2     Binge frequency: Never   ??? Drug use: Never   ??? Sexual activity: Not on file   Lifestyle   ??? Physical activity:     Days per week: Not on file     Minutes per session: Not on file   ??? Stress: Not on file   Relationships   ??? Social connections:     Talks on phone: Not on file     Gets together: Not on file     Attends religious service: Not on file     Active member of club or organization: Not on file     Attends meetings of clubs or organizations: Not on file     Relationship status: Not on file   ??? Intimate partner violence:     Fear of current  or ex partner: Not on file     Emotionally abused: Not on file     Physically abused: Not on file     Forced sexual activity: Not on file   Other Topics Concern   ??? Not on file   Social History Narrative   ??? Not on file        No Known Allergies    Current Outpatient Medications   Medication Sig Dispense Refill   ??? glipiZIDE (GLUCOTROL) 10 mg tablet Take 10 mg by mouth daily.     ??? empagliflozin (JARDIANCE) 25 mg tablet Take 1 Tab by mouth daily. 30 Tab 0   ??? ramipril (ALTACE) 10 mg capsule Take 1 Cap by mouth daily. 30 Cap 11   ??? clopidogrel (PLAVIX) 75 mg tab Take 1 Tab by mouth nightly. 30 Tab 1   ??? atorvastatin (LIPITOR) 40 mg tablet Take 1 Tab by mouth daily. 30 Tab 1   ??? OTHER,NON-FORMULARY, Take 2 Caps by mouth daily. Indications: T5RX Testosterone Booster     ??? OTHER,NON-FORMULARY, Take 2 Caplet by mouth daily. Indications: Extra Emergency planning/management officer Advanced with Kenard Gower     ??? OTHER,NON-FORMULARY, Take 1 Cap by mouth daily. Indications: [employer] Men's Yohimbe 451 Dietary Supplement     ??? OTHER,NON-FORMULARY, Take 2 Caplet by mouth daily. Indications: Nitric Muscle Uptake Maximum Strength Formula      ??? OTHER,NON-FORMULARY, Take 2 Caps by mouth daily. Indications: Perform XT Testosterone Support     ??? OTHER,NON-FORMULARY, Take 2 Caplet by mouth daily. Indications: Super Beta Prostate P3 Advanced Prostate Support Complex     ??? sildenafil, antihypertensive, (REVATIO) 20 mg tablet Take 1 Tab by mouth daily as needed (ed). 1-5 tabs for erectile dysfunction 30 Tab 11        Review of Systems:       Review of Systems   Constitutional: Negative for malaise/fatigue and weight loss.   Respiratory: Negative for cough and shortness of breath.    Cardiovascular: Negative for chest pain and palpitations.   Gastrointestinal: Negative for abdominal pain, diarrhea, nausea and vomiting.   Skin: Negative for rash.        Objective:  Visit Vitals  BP 161/89 (BP 1 Location: Left arm, BP Patient Position: Sitting)   Pulse 75   Temp 97.1 ??F (36.2 ??C) (Oral)   Ht 6\' 2"  (1.88 m)   Wt 250 lb (113.4 kg)   SpO2 100%   BMI 32.10 kg/m??       Physical Exam:  Physical Exam   Constitutional: He is oriented to person, place, and time. He appears well-developed and well-nourished.   HENT:   Head: Normocephalic and atraumatic.   Eyes: Pupils are equal, round, and reactive to light. EOM are normal.   Neck: Normal range of motion.   Cardiovascular: Normal rate, regular rhythm and normal heart sounds.   Pulmonary/Chest: Effort normal and breath sounds normal. No respiratory distress.   Abdominal: Soft. Bowel sounds are normal. There is no tenderness. There is no rebound and no guarding.   Musculoskeletal: Normal range of motion.   Neurological: He is alert and oriented to person, place, and time.   Skin: No rash noted.   Psychiatric: He has a normal mood and affect. His behavior is normal. Thought content normal.       Assessment/Plan:  Diagnoses and all orders for this visit:    1. Essential hypertension  Assessment & Plan:  Uncontrolled.  Unknown compliance this patient is  not familiar with  current medications.  Will call [facility] pharmacy to reconcile medications.  Additionally, recommended patient take currently prescribed ramipril.  Counseled on salt restriction and diet/exercise.      2. History of throat cancer  Assessment & Plan:  Will refer to ENT for continued monitoring/evaluation/management.  No alarming findings or historical endorsements noted today.  We will additionally attempt to obtain ENT records    Orders:  -     REFERRAL TO ENT-OTOLARYNGOLOGY    3. Tongue cancer ([place])  -     REFERRAL TO ENT-OTOLARYNGOLOGY    4. Type 2 diabetes mellitus without complication, without long-term current use of insulin ([facility])  -     empagliflozin (JARDIANCE) 25 mg tablet; Take 1 Tab by mouth daily.      Follow-up and Dispositions    ?? Return in about 1 week (around 08/15/2017) for med rec (have patient bring in all meds).           Youlanda Roys. Wallis Mart, MD

## 2017-08-08 NOTE — Assessment & Plan Note (Signed)
Uncontrolled.  Unknown compliance this patient is not familiar with current medications.  Will call [facility] pharmacy to reconcile medications.  Additionally, recommended patient take currently prescribed ramipril.  Counseled on salt restriction and diet/exercise.

## 2017-08-08 NOTE — Progress Notes (Signed)
[place] Wallis Mart, M.D.  Family Medicine  [address] [place] [address]  Office : [phone]  Fax : [phone]    08/08/2017  Subjective:  The patient is a 67 y.o. male who presents for a problem visit.    Chief Complaint   Patient presents with   ??? Dysphagia     Pt c/o dysphagia, has hx of throat cancer.    ??? Headache     Pt c/o headaches when coughing     Dysphasia: Patient reports over the past several weeks that he has been experiencing dysphagia characterized/described as "food getting stuck".  Patient denies any weight loss, night sweats, throat pain.  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  Timing of patient's concern is intermittent.  Denies any known exacerbating factors.  Has not done or taken anything for relief.    HTN:  Patient does report compliance with his prescribed medications.  Patient does not check BP at home, average BP is not w/in goal.  Denies chest pain, shortness of breath, dyspnea on exertion, or edema; no vision changes or neurologic symptoms reported.  Patient presents today unaware of medications being taken.  Does not know if he has been taking his blood pressure medication.    Otherwise, patient endorses compliant with current medication regimens and denies any adverse reactions.  Patient has no new concerns/complaints today.      Past Medical History:   Diagnosis Date   ??? CAD (coronary artery disease)    ??? Cancer ([place])     Throat and tounge       Social History     Socioeconomic History   ??? Marital status: MARRIED     Spouse name: Not on file   ??? Number of children: Not on file   ??? Years of education: Not on file   ??? Highest education level: Not on file   Occupational History   ??? Not on file   Social Needs   ??? Financial resource strain: Not on file   ??? Food insecurity:     Worry: Not on file     Inability: Not on file   ??? Transportation needs:     Medical:  Not on file     Non-medical: Not on file   Tobacco Use   ??? Smoking status: Never Smoker   ??? Smokeless tobacco: Never Used   Substance and Sexual Activity   ??? Alcohol use: Yes     Frequency: Monthly or less     Drinks per session: 1 or 2     Binge frequency: Never   ??? Drug use: Never   ??? Sexual activity: Not on file   Lifestyle   ??? Physical activity:     Days per week: Not on file     Minutes per session: Not on file   ??? Stress: Not on file   Relationships   ??? Social connections:     Talks on phone: Not on file     Gets together: Not on file     Attends religious service: Not on file     Active member of club or organization: Not on file     Attends meetings of clubs or organizations: Not on file     Relationship status: Not on file   ??? Intimate partner violence:     Fear of current  or ex partner: Not on file     Emotionally abused: Not on file     Physically abused: Not on file     Forced sexual activity: Not on file   Other Topics Concern   ??? Not on file   Social History Narrative   ??? Not on file        No Known Allergies    Current Outpatient Medications   Medication Sig Dispense Refill   ??? glipiZIDE (GLUCOTROL) 10 mg tablet Take 10 mg by mouth daily.     ??? empagliflozin (JARDIANCE) 25 mg tablet Take 1 Tab by mouth daily. 30 Tab 0   ??? ramipril (ALTACE) 10 mg capsule Take 1 Cap by mouth daily. 30 Cap 11   ??? clopidogrel (PLAVIX) 75 mg tab Take 1 Tab by mouth nightly. 30 Tab 1   ??? atorvastatin (LIPITOR) 40 mg tablet Take 1 Tab by mouth daily. 30 Tab 1   ??? OTHER,NON-FORMULARY, Take 2 Caps by mouth daily. Indications: T5RX Testosterone Booster     ??? OTHER,NON-FORMULARY, Take 2 Caplet by mouth daily. Indications: Extra Emergency planning/management officer Advanced with Kenard Gower     ??? OTHER,NON-FORMULARY, Take 1 Cap by mouth daily. Indications: [employer] Men's Yohimbe 451 Dietary Supplement     ??? OTHER,NON-FORMULARY, Take 2 Caplet by mouth daily. Indications: Nitric Muscle Uptake Maximum Strength Formula     ??? OTHER,NON-FORMULARY, Take 2  Caps by mouth daily. Indications: Perform XT Testosterone Support     ??? OTHER,NON-FORMULARY, Take 2 Caplet by mouth daily. Indications: Super Beta Prostate P3 Advanced Prostate Support Complex     ??? sildenafil, antihypertensive, (REVATIO) 20 mg tablet Take 1 Tab by mouth daily as needed (ed). 1-5 tabs for erectile dysfunction 30 Tab 11        Review of Systems:       Review of Systems   Constitutional: Negative for malaise/fatigue and weight loss.   Respiratory: Negative for cough and shortness of breath.    Cardiovascular: Negative for chest pain and palpitations.   Gastrointestinal: Negative for abdominal pain, diarrhea, nausea and vomiting.   Skin: Negative for rash.        Objective:  Visit Vitals  BP 161/89 (BP 1 Location: Left arm, BP Patient Position: Sitting)   Pulse 75   Temp 97.1 ??F (36.2 ??C) (Oral)   Ht 6\' 2"  (1.88 m)   Wt 250 lb (113.4 kg)   SpO2 100%   BMI 32.10 kg/m??       Physical Exam:  Physical Exam   Constitutional: He is oriented to person, place, and time. He appears well-developed and well-nourished.   HENT:   Head: Normocephalic and atraumatic.   Eyes: Pupils are equal, round, and reactive to light. EOM are normal.   Neck: Normal range of motion.   Cardiovascular: Normal rate, regular rhythm and normal heart sounds.   Pulmonary/Chest: Effort normal and breath sounds normal. No respiratory distress.   Abdominal: Soft. Bowel sounds are normal. There is no tenderness. There is no rebound and no guarding.   Musculoskeletal: Normal range of motion.   Neurological: He is alert and oriented to person, place, and time.   Skin: No rash noted.   Psychiatric: He has a normal mood and affect. His behavior is normal. Thought content normal.       Assessment/Plan:  Diagnoses and all orders for this visit:    1. Essential hypertension  Assessment & Plan:  Uncontrolled.  Unknown compliance this patient is  not familiar with current medications.  Will call [facility] to reconcile medications.   Additionally, recommended patient take currently prescribed ramipril.  Counseled on salt restriction and diet/exercise.      2. History of throat cancer  Assessment & Plan:  Will refer to ENT for continued monitoring/evaluation/management.  No alarming findings or historical endorsements noted today.  We will additionally attempt to obtain ENT records    Orders:  -     REFERRAL TO ENT-OTOLARYNGOLOGY    3. Tongue cancer ([place])  -     REFERRAL TO ENT-OTOLARYNGOLOGY    4. Type 2 diabetes mellitus without complication, without long-term current use of insulin (HCC)  -     empagliflozin (JARDIANCE) 25 mg tablet; Take 1 Tab by mouth daily.      Follow-up and Dispositions    ?? Return in about 1 week (around 08/15/2017) for med rec (have patient bring in all meds).           Youlanda Roys. Wallis Mart, MD

## 2017-08-16 ENCOUNTER — Ambulatory Visit
Admit: 2017-08-16 | Discharge: 2017-08-16 | Payer: MEDICARE | Attending: Otolaryngology/Facial Plastic Surgery | Primary: Family Medicine

## 2017-08-16 ENCOUNTER — Ambulatory Visit: Attending: Otolaryngology/Facial Plastic Surgery | Primary: Family Medicine

## 2017-08-16 DIAGNOSIS — R1310 Dysphagia, unspecified: Secondary | ICD-10-CM

## 2017-08-16 NOTE — Progress Notes (Signed)
HPI:  Jermaine Romero is a 67 y.o. male seen today in initial consultation for New Patient (h/o tongue cancer, treatment in [place] in 2017, having diffculty swallowing , feels like something is stuck in throat, clears throat ,denies sore throat ). He comes in today w/ concern for dysphagia and the sensation of something stuck in his throat. 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 He recently moved up here from [place] and has not seen another ENT physician yet. He describes some thickened phlegm in his throat which requires freq throat clearing and he mainly has dysphagia to thicker solid foods. There is no sore throat but he has to clear his throat frequently. Has not yet had a BSS.      Past Medical History, Past Surgical History, Family history, Social History, and Medications were all reviewed with the patient today and updated as necessary.     No Known Allergies  Patient Active Problem List   Diagnosis Code   ??? Paroxysmal atrial fibrillation (HCC) I48.0   ??? Coronary artery disease involving native coronary artery of native heart without angina pectoris I25.10   ??? History of MI (myocardial infarction) I25.2   ??? History of anemia Z86.2   ??? Essential hypertension I10   ??? Muscle weakness M62.81   ??? Tongue cancer (HCC) C02.9   ??? 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 Other hyperlipidemia E78.49   ??? History of throat cancer Z85.819     Current Outpatient Medications   Medication Sig   ??? glipiZIDE (GLUCOTROL) 10 mg tablet Take 10 mg by mouth daily.   ??? empagliflozin (JARDIANCE) 25 mg tablet Take 1 Tab by mouth daily.   ??? ramipril (ALTACE) 10 mg capsule Take 1 Cap by mouth daily.   ??? clopidogrel (PLAVIX) 75 mg tab Take 1 Tab by mouth nightly.   ??? atorvastatin (LIPITOR) 40 mg tablet Take 1 Tab by mouth daily.   ??? sildenafil, antihypertensive, (REVATIO) 20 mg tablet Take 1 Tab by mouth daily as needed (ed). 1-5 tabs for erectile dysfunction    ??? OTHER,NON-FORMULARY, Take 2 Caps by mouth daily. Indications: T5RX Testosterone Booster   ??? OTHER,NON-FORMULARY, Take 2 Caplet by mouth daily. Indications: Extra Emergency planning/management officer Advanced with Kenard Gower   ??? OTHER,NON-FORMULARY, Take 1 Cap by mouth daily. Indications: [employer] Men's Yohimbe 451 Dietary Supplement   ??? OTHER,NON-FORMULARY, Take 2 Caplet by mouth daily. Indications: Nitric Muscle Uptake Maximum Strength Formula   ??? OTHER,NON-FORMULARY, Take 2 Caps by mouth daily. Indications: Perform XT Testosterone Support   ??? OTHER,NON-FORMULARY, Take 2 Caplet by mouth daily. Indications: Super Beta Prostate P3 Advanced Prostate Support Complex     No current facility-administered medications for this visit.      Past Medical History:   Diagnosis Date   ??? CAD (coronary artery disease)    ??? Cancer ([place])     Throat and tounge     Social History     Tobacco Use   ??? Smoking status: Never Smoker   ??? Smokeless tobacco: Never Used   Substance Use Topics   ??? Alcohol use: Yes     Frequency: Monthly or less     Drinks per session: 1 or 2     Binge frequency: Never     Past Surgical History:   Procedure Laterality Date   ??? CARDIAC SURG PROCEDURE UNLIST     ??? HX APPENDECTOMY     ???  HX HERNIA REPAIR     ??? HX ORTHOPAEDIC       History reviewed. No pertinent family history.     ROS:    Review of Systems   Constitutional: Negative for fever.   HENT: Positive for sore throat and trouble swallowing.    Eyes: Negative for visual disturbance.   Respiratory: Negative for shortness of breath.    Cardiovascular: Negative for chest pain.   Gastrointestinal: Negative for abdominal pain.   Musculoskeletal: Negative for back pain.   Allergic/Immunologic: Negative for environmental allergies.   Neurological: Negative for dizziness.   Hematological: Negative for adenopathy.        PHYSICAL EXAM:    Visit Vitals  BP 140/80 (BP 1 Location: Left arm, BP Patient Position: Sitting)   Ht 6\' 2"  (1.88 m)   Wt 247 lb (112 kg)   BMI 31.71 kg/m??        General: NAD, well-appearing  Neuro: No gross neuro deficits. CN's II-XII intact. No facial weakness.  Eyes: EOMI. Pupils reactive. No periorbital edema/ecchymosis. No nystagmus.  Skin: No facial erythema, rashes or concerning lesions.  Nose: No external deviations or saddling. Intranasally, septum is midline without perforations, nasal mucosa appears healthy with no erythema, mucopurulence, or polyps.  Mouth: Dentures in place- well-fitting. Moist mucus membranes, normal tongue/palate mobility, no concerning mucosal lesions. Oropharynx reveals changes c/w previous RT. Both tonsillar fossa and BOT is soft on palpation.  Ears: Normal appearing auricles, no hematomas. EACs clear with no cerumen impaction, healthy canal skin, TM's intact with no perforations or retraction pockets. No middle ear effusions.  Neck: Firm along L side from previous RT- no overlying skin changes, no palpable masses. No thyromegaly or palpable thyroid nodules. No surgical scars.  Lymphatics: No palpable cervical LAD.  Resp: No audible stridor or wheezing.  Extremities: No clubbing or cyanosis.    PROCEDURE: Flexible laryngoscopy  INDICATIONS: Dysphagia, h/o tongue SCCA  DESCRIPTION: After verbal consent was obtained and a timeout was performed, the flexible scope was advanced down one of the patient's nares into the nasopharynx. The nasal cavity and nasopharynx were clear. The scope was then turned distally down towards the oropharynx. The BOT and vallecula were clear and the epiglottis was normal in appearance. Some changes c/w previous RT along BOT. Both aryepiglottic folds were clear and there was a normal appearing glottis w/ healthy TVCs. No nodules or polyps and the cords were fully mobile. No concerning lesions seen along post-cricoid region or within either piriform sinus. The posterior pharyngeal was clear as well. The scope was then carefully removed. The patient tolerated the procedure well and there were no complications.       ASSESSMENT and PLAN      ICD-10-CM ICD-9-CM    1. Dysphagia, unspecified type R13.10 787.20 LARYNGOSCOPY,FLEX FIBER,DIAGNOSTIC   2. 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 I think most of his dysphagia is related to his previous radiation therapy. We discussed the possibility of a barium swallow to better evaluate his dysphagia, but I think we can hold off for now. RTC in 2 mo for routine cancer surveillance.    Marijo File, MD  08/16/2017

## 2017-08-16 NOTE — Progress Notes (Signed)
HPI:  Jermaine Romero is a 67 y.o. male seen today in initial consultation for New Patient (h/o tongue cancer, treatment in [place] in 2017, having diffculty swallowing , feels like something is stuck in throat, clears throat ,denies sore throat ). He comes in today w/ concern for dysphagia and the sensation of something stuck in his throat. 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 He recently moved up here from [place] and has not seen another ENT physician yet. He describes some thickened phlegm in his throat which requires freq throat clearing and he mainly has dysphagia to thicker solid foods. There is no sore throat but he has to clear his throat frequently. Has not yet had a BSS.      Past Medical History, Past Surgical History, Family history, Social History, and Medications were all reviewed with the patient today and updated as necessary.     No Known Allergies  Patient Active Problem List   Diagnosis Code   ??? Paroxysmal atrial fibrillation (HCC) I48.0   ??? Coronary artery disease involving native coronary artery of native heart without angina pectoris I25.10   ??? History of MI (myocardial infarction) I25.2   ??? History of anemia Z86.2   ??? Essential hypertension I10   ??? Muscle weakness M62.81   ??? Tongue cancer (HCC) C02.9   ??? 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 Other hyperlipidemia E78.49   ??? History of throat cancer Z85.819     Current Outpatient Medications   Medication Sig   ??? glipiZIDE (GLUCOTROL) 10 mg tablet Take 10 mg by mouth daily.   ??? empagliflozin (JARDIANCE) 25 mg tablet Take 1 Tab by mouth daily.   ??? ramipril (ALTACE) 10 mg capsule Take 1 Cap by mouth daily.   ??? clopidogrel (PLAVIX) 75 mg tab Take 1 Tab by mouth nightly.   ??? atorvastatin (LIPITOR) 40 mg tablet Take 1 Tab by mouth daily.   ??? sildenafil, antihypertensive, (REVATIO) 20 mg tablet Take 1 Tab by mouth daily as needed (ed). 1-5 tabs for erectile dysfunction   ??? OTHER,NON-FORMULARY,  Take 2 Caps by mouth daily. Indications: T5RX Testosterone Booster   ??? OTHER,NON-FORMULARY, Take 2 Caplet by mouth daily. Indications: Extra Emergency planning/management officer Advanced with Kenard Gower   ??? OTHER,NON-FORMULARY, Take 1 Cap by mouth daily. Indications: [employer] Men's Yohimbe 451 Dietary Supplement   ??? OTHER,NON-FORMULARY, Take 2 Caplet by mouth daily. Indications: Nitric Muscle Uptake Maximum Strength Formula   ??? OTHER,NON-FORMULARY, Take 2 Caps by mouth daily. Indications: Perform XT Testosterone Support   ??? OTHER,NON-FORMULARY, Take 2 Caplet by mouth daily. Indications: Super Beta Prostate P3 Advanced Prostate Support Complex     No current facility-administered medications for this visit.      Past Medical History:   Diagnosis Date   ??? CAD (coronary artery disease)    ??? Cancer ([place])     Throat and tounge     Social History     Tobacco Use   ??? Smoking status: Never Smoker   ??? Smokeless tobacco: Never Used   Substance Use Topics   ??? Alcohol use: Yes     Frequency: Monthly or less     Drinks per session: 1 or 2     Binge frequency: Never     Past Surgical History:   Procedure Laterality Date   ??? CARDIAC SURG PROCEDURE UNLIST     ??? HX APPENDECTOMY     ???  HX HERNIA REPAIR     ??? HX ORTHOPAEDIC       History reviewed. No pertinent family history.     ROS:    Review of Systems   Constitutional: Negative for fever.   HENT: Positive for sore throat and trouble swallowing.    Eyes: Negative for visual disturbance.   Respiratory: Negative for shortness of breath.    Cardiovascular: Negative for chest pain.   Gastrointestinal: Negative for abdominal pain.   Musculoskeletal: Negative for back pain.   Allergic/Immunologic: Negative for environmental allergies.   Neurological: Negative for dizziness.   Hematological: Negative for adenopathy.        PHYSICAL EXAM:    Visit Vitals  BP 140/80 (BP 1 Location: Left arm, BP Patient Position: Sitting)   Ht 6\' 2"  (1.88 m)   Wt 247 lb (112 kg)   BMI 31.71 kg/m??       General: NAD,  well-appearing  Neuro: No gross neuro deficits. CN's II-XII intact. No facial weakness.  Eyes: EOMI. Pupils reactive. No periorbital edema/ecchymosis. No nystagmus.  Skin: No facial erythema, rashes or concerning lesions.  Nose: No external deviations or saddling. Intranasally, septum is midline without perforations, nasal mucosa appears healthy with no erythema, mucopurulence, or polyps.  Mouth: Dentures in place- well-fitting. Moist mucus membranes, normal tongue/palate mobility, no concerning mucosal lesions. Oropharynx reveals changes c/w previous RT. Both tonsillar fossa and BOT is soft on palpation.  Ears: Normal appearing auricles, no hematomas. EACs clear with no cerumen impaction, healthy canal skin, TM's intact with no perforations or retraction pockets. No middle ear effusions.  Neck: Firm along L side from previous RT- no overlying skin changes, no palpable masses. No thyromegaly or palpable thyroid nodules. No surgical scars.  Lymphatics: No palpable cervical LAD.  Resp: No audible stridor or wheezing.  Extremities: No clubbing or cyanosis.    PROCEDURE: Flexible laryngoscopy  INDICATIONS: Dysphagia, h/o tongue SCCA  DESCRIPTION: After verbal consent was obtained and a timeout was performed, the flexible scope was advanced down one of the patient's nares into the nasopharynx. The nasal cavity and nasopharynx were clear. The scope was then turned distally down towards the oropharynx. The BOT and vallecula were clear and the epiglottis was normal in appearance. Some changes c/w previous RT along BOT. Both aryepiglottic folds were clear and there was a normal appearing glottis w/ healthy TVCs. No nodules or polyps and the cords were fully mobile. No concerning lesions seen along post-cricoid region or within either piriform sinus. The posterior pharyngeal was clear as well. The scope was then carefully removed. The patient tolerated the procedure well and there were no complications.      ASSESSMENT and  PLAN      ICD-10-CM ICD-9-CM    1. Dysphagia, unspecified type R13.10 787.20 LARYNGOSCOPY,FLEX FIBER,DIAGNOSTIC   2. 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 I think most of his dysphagia is related to his previous radiation therapy. We discussed the possibility of a barium swallow to better evaluate his dysphagia, but I think we can hold off for now. RTC in 2 mo for routine cancer surveillance.    Marijo File, MD  08/16/2017

## 2017-09-14 ENCOUNTER — Encounter: Attending: Family Medicine | Primary: Family Medicine

## 2017-09-29 ENCOUNTER — Encounter: Primary: Family Medicine

## 2017-10-10 NOTE — Telephone Encounter (Signed)
Pt called w/ some complaints and is requesting OV w/ Dr Colon Flattery.     Pt reports BLE edema up to knees X3 days. States swelling goes down some at night but not completely.  States that he has gained 15 lb since last OV.  Also reports that when he coughs, he feels a sharp pain in his head that lasts seconds and goes away.  Also reports BS 205 this morning. PCP prescribed Jardiance but he can't afford it. Is currently taking glipizide.     Pt denies SOB. Does not check BP.    Pt scheduled into office 10-12-17 @ 1345/MA (Dr Colon Flattery).

## 2017-10-10 NOTE — Telephone Encounter (Signed)
Patient called stated that he has been experiencing a lot swelling in his legs. He said it's been going on for about 3 days now and will not go down. Please review and follow up. Thanks

## 2017-10-12 ENCOUNTER — Ambulatory Visit
Admit: 2017-10-12 | Discharge: 2017-10-12 | Payer: MEDICARE | Attending: Cardiovascular Disease | Primary: Family Medicine

## 2017-10-12 ENCOUNTER — Ambulatory Visit: Attending: Cardiovascular Disease | Primary: Family Medicine

## 2017-10-12 DIAGNOSIS — R600 Localized edema: Secondary | ICD-10-CM

## 2017-10-12 MED ORDER — FUROSEMIDE 20 MG TAB
20 mg | ORAL_TABLET | Freq: Every day | ORAL | 11 refills | Status: AC | PRN
Start: 2017-10-12 — End: ?

## 2017-10-12 NOTE — Progress Notes (Signed)
[facility]  [address], [address]  [place] [address]  PHONE: [phone]    NAME: Jermaine Romero  DOB: 1950-04-04     HPI  Jermaine Romero is a 67 y.o. male seen for a follow up visit regarding   Follow Up Chronic Condition        He is here in f/u on his CAD post CABG .  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 No chest pain or dyspnea.  He has had trouble with the edema which is mostly venous.   He has stopped statin again.  He has had limited f/ with IM.   He is now working up around [place] and may be moving to [place].     He did not afford Jardiance and is not interested in       Past Medical History, Past Surgical History, Family history, Social History, and Medications were all reviewed with the patient today and updated as necessary.     Outpatient Medications Marked as Taking for the 10/12/17 encounter (Office Visit) with Fenton Foy, MD   Medication Sig Dispense Refill   ??? glipiZIDE (GLUCOTROL) 10 mg tablet Take 10 mg by mouth daily.     ??? OTHER,NON-FORMULARY, Take 2 Caps by mouth daily. Indications: T5RX Testosterone Booster     ??? OTHER,NON-FORMULARY, Take 2 Caplet by mouth daily. Indications: Super Beta Prostate P3 Advanced Prostate Support Complex       Patient Active Problem List    Diagnosis   ??? History of throat cancer   ??? Other hyperlipidemia   ??? Tongue cancer ([place])     THROAT CANCER   EXCISION AND RADIATION CHEMO 2017      ??? Hx of CABG     04/18/17  [facility]  Dr Soyla Murphy   CABGx5   atretic LIMA proximal   distal LIMA free graft to LAD via SVG to circumflex pedicle SVG D1 SVG D2 via hood SVG  D1  SVG OM distal SVG LPL   EF50% lateral scar   LAA LIGATION 35MM ATRICLIP      ??? Paroxysmal atrial fibrillation ([place])   ??? Coronary artery disease involving native coronary artery of native heart without angina pectoris   ??? History of MI (myocardial infarction)   ??? History of anemia   ??? Essential hypertension    ??? Muscle weakness     No Known Allergies  Past Medical History:   Diagnosis Date   ??? CAD (coronary artery disease)    ??? Cancer ([place])     Throat and tounge     Past Surgical History:   Procedure Laterality Date   ??? CARDIAC SURG PROCEDURE UNLIST     ??? HX APPENDECTOMY     ??? HX HERNIA REPAIR     ??? HX ORTHOPAEDIC       No family history on file.   Social History     Tobacco Use   ??? Smoking status: Never Smoker   ??? Smokeless tobacco: Never Used   Substance Use Topics   ??? Alcohol use: Yes     Frequency: Monthly or less     Drinks per session: 1 or 2     Binge frequency: Never           ROS            Wt Readings from Last 3 Encounters:   10/12/17 247 lb (112 kg)  08/16/17 247 lb (112 kg)   08/08/17 250 lb (113.4 kg)     BP Readings from Last 3 Encounters:   10/12/17 120/80   08/16/17 140/80   08/08/17 161/89       Visit Vitals  BP 120/80   Pulse 70   Ht '6\' 2"'  (1.88 m)   Wt 247 lb (112 kg)   BMI 31.71 kg/m??         Physical Exam   Constitutional: He appears well-developed and well-nourished.   HENT:   Head: Normocephalic.   Neck: No JVD present. Carotid bruit is not present.   Cardiovascular: Normal rate, regular rhythm and normal heart sounds.   Pulmonary/Chest: Effort normal and breath sounds normal.   Abdominal: Soft.   Musculoskeletal: He exhibits no edema.   Neurological: He is alert.   Skin: Skin is warm and dry.   Psychiatric: He has a normal mood and affect.       Medical problems and test results were reviewed with the patient today.     No results found for any visits on 10/12/17.      Lab Results   Component Value Date/Time    Hemoglobin A1c 7.9 (H) 06/13/2017 09:12 AM       Lab Results   Component Value Date/Time    WBC 5.2 06/13/2017 09:12 AM    HGB 13.7 06/13/2017 09:12 AM    HCT 42.7 06/13/2017 09:12 AM    PLATELET 250 06/13/2017 09:12 AM    MCV 88 06/13/2017 09:12 AM       Lab Results   Component Value Date/Time    Sodium 142 06/13/2017 09:12 AM    Potassium 4.5 06/13/2017 09:12 AM     Chloride 100 06/13/2017 09:12 AM    CO2 23 06/13/2017 09:12 AM    Glucose 115 (H) 06/13/2017 09:12 AM    BUN 22 06/13/2017 09:12 AM    Creatinine 1.24 06/13/2017 09:12 AM    BUN/Creatinine ratio 18 06/13/2017 09:12 AM    GFR est AA 68 06/13/2017 09:12 AM    GFR est non-AA 59 (L) 06/13/2017 09:12 AM    Calcium 9.8 06/13/2017 09:12 AM       Lab Results   Component Value Date/Time    ALT (SGPT) 39 06/13/2017 09:12 AM    AST (SGOT) 23 06/13/2017 09:12 AM    Alk. phosphatase 155 (H) 06/13/2017 09:12 AM    Bilirubin, total 0.5 06/13/2017 09:12 AM       Lab Results   Component Value Date/Time    Cholesterol, total 119 06/13/2017 09:12 AM    HDL Cholesterol 35 (L) 06/13/2017 09:12 AM    LDL, calculated 66 06/13/2017 09:12 AM    VLDL, calculated 18 06/13/2017 09:12 AM    Triglyceride 92 06/13/2017 09:12 AM         ASSESSMENT and PLAN    Diagnoses and all orders for this visit:    1. Paroxysmal atrial fibrillation [facility])  he is post op CABG in [place] with LAA ligation  . no signs or symptoms of afib .     2. Essential hypertension  blood pressure in reasonable range continue current medications low salt diet and monitor for goal in 120/80 range      3. Hx of CABG   trying to reduce risk but does not want to continue statin or add PCSK9inhibitor .      diet and exercise     4  edema  venous stasis  may try lasix prn   stockings as needed while driving     1/94/17  [facility]  Dr Soyla Murphy   CABGx5   atretic LIMA proximal   distal LIMA free graft to LAD via SVG to circumflex pedicle SVG D1 SVG D2 via hood SVG  D1  SVG OM distal SVG LPL   EF50% lateral scar   LAA LIGATION 35MM ATRICLIP                              Fenton Foy, MD  10/12/2017  1:43 PM

## 2017-10-12 NOTE — Progress Notes (Signed)
Progress  Notes by Fenton Foy, MD at 10/12/17 1345                Author: Fenton Foy, MD  Service: --  Author Type: Physician       Filed: 10/12/17 1405  Encounter Date: 10/12/2017  Status: Signed          Editor: Fenton Foy, MD (Physician)                         [facility]   [address], [address]   [place] [address]   PHONE: [phone]      NAME: Jermaine Romero   DOB: 09/16/1950       HPI   Jermaine Romero is a 67 y.o.  male seen for a follow up visit regarding    Follow Up Chronic Condition         He is here in f/u on his CAD post CABG .  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 No chest  pain or dyspnea.   He has had trouble with the edema which is mostly venous.   He has stopped statin again.  He has had limited f/ with IM.   He is now working up around [place] and may be moving to [place].     He did not afford Jardiance and is not interested in          Past Medical History, Past Surgical History, Family history, Social History, and Medications were all reviewed with the patient today and updated as necessary.         Outpatient Medications Marked as Taking for the 10/12/17 encounter (Office Visit) with Fenton Foy, MD          Medication  Sig  Dispense  Refill           ?  glipiZIDE (GLUCOTROL) 10 mg tablet  Take 10 mg by mouth daily.         ?  OTHER,NON-FORMULARY,  Take 2 Caps by mouth daily. Indications: T5RX Testosterone Booster               ?  OTHER,NON-FORMULARY,  Take 2 Caplet by mouth daily. Indications: Super Beta Prostate P3 Advanced Prostate Support Complex              Patient Active Problem List          Diagnosis        ?  History of throat cancer     ?  Other hyperlipidemia     ?  Tongue cancer [facility])             THROAT CANCER   EXCISION AND RADIATION CHEMO 2017            ?  Hx of CABG             04/18/17  [facility]  Dr Soyla Murphy   CABGx5   atretic LIMA proximal   distal LIMA free graft to LAD via SVG to  circumflex pedicle SVG D1 SVG D2  via hood SVG  D1  SVG OM distal SVG LPL    EF50% lateral scar   LAA LIGATION 35MM ATRICLIP            ?  Paroxysmal atrial fibrillation ([facility])     ?  Coronary artery disease involving native coronary artery of  native heart without angina pectoris     ?  History of MI (myocardial infarction)     ?  History of anemia     ?  Essential hypertension        ?  Muscle weakness        No Known Allergies     Past Medical History:        Diagnosis  Date         ?  CAD (coronary artery disease)       ?  Cancer ([place])            Throat and tounge          Past Surgical History:         Procedure  Laterality  Date          ?  CARDIAC SURG PROCEDURE UNLIST         ?  HX APPENDECTOMY         ?  HX HERNIA REPAIR              ?  HX ORTHOPAEDIC            No family history on file.      Social History          Tobacco Use         ?  Smoking status:  Never Smoker     ?  Smokeless tobacco:  Never Used       Substance Use Topics         ?  Alcohol use:  Yes              Frequency:  Monthly or less         Drinks per session:  1 or 2              Binge frequency:  Never                 ROS                   Wt Readings from Last 3 Encounters:        10/12/17  247 lb (112 kg)     08/16/17  247 lb (112 kg)        08/08/17  250 lb (113.4 kg)          BP Readings from Last 3 Encounters:        10/12/17  120/80     08/16/17  140/80        08/08/17  161/89           Visit Vitals      BP  120/80     Pulse  70     Ht  '6\' 2"'$  (1.88 m)     Wt  247 lb (112 kg)        BMI  31.71 kg/m??              Physical Exam    Constitutional: He appears well-developed and well-nourished.    HENT:    Head: Normocephalic.    Neck: No JVD present. Carotid bruit is not present.    Cardiovascular: Normal rate, regular rhythm and normal heart sounds.    Pulmonary/Chest: Effort normal and breath sounds normal.    Abdominal: Soft.    Musculoskeletal: He exhibits no edema.   Neurological: He is alert.    Skin: Skin is warm and dry.    Psychiatric:  He has a normal mood and affect.          Medical problems and test results were reviewed with the patient today.       No results found for any visits on 10/12/17.           Lab Results         Component  Value  Date/Time            Hemoglobin A1c  7.9 (H)  06/13/2017 09:12 AM             Lab Results         Component  Value  Date/Time            WBC  5.2  06/13/2017 09:12 AM       HGB  13.7  06/13/2017 09:12 AM       HCT  42.7  06/13/2017 09:12 AM       PLATELET  250  06/13/2017 09:12 AM            MCV  88  06/13/2017 09:12 AM             Lab Results         Component  Value  Date/Time            Sodium  142  06/13/2017 09:12 AM       Potassium  4.5  06/13/2017 09:12 AM       Chloride  100  06/13/2017 09:12 AM       CO2  23  06/13/2017 09:12 AM       Glucose  115 (H)  06/13/2017 09:12 AM       BUN  22  06/13/2017 09:12 AM       Creatinine  1.24  06/13/2017 09:12 AM       BUN/Creatinine ratio  18  06/13/2017 09:12 AM       GFR est AA  68  06/13/2017 09:12 AM       GFR est non-AA  59 (L)  06/13/2017 09:12 AM            Calcium  9.8  06/13/2017 09:12 AM             Lab Results         Component  Value  Date/Time            ALT (SGPT)  39  06/13/2017 09:12 AM       AST (SGOT)  23  06/13/2017 09:12 AM       Alk. phosphatase  155 (H)  06/13/2017 09:12 AM            Bilirubin, total  0.5  06/13/2017 09:12 AM             Lab Results         Component  Value  Date/Time            Cholesterol, total  119  06/13/2017 09:12 AM       HDL Cholesterol  35 (L)  06/13/2017 09:12 AM       LDL, calculated  66  06/13/2017 09:12 AM       VLDL, calculated  18  06/13/2017 09:12 AM            Triglyceride  92  06/13/2017 09:12 AM              ASSESSMENT and PLAN      Diagnoses and all  orders for this visit:      1. Paroxysmal atrial fibrillation [facility])  he is post op CABG in [place] with LAA ligation  . no signs or symptoms of afib .       2. Essential hypertension  blood pressure in reasonable range continue current  medications low salt diet and monitor for goal in 120/80 range         3. Hx of CABG   trying to reduce risk but does not want to continue statin or add PCSK9inhibitor .      diet and exercise       4  edema  venous stasis   may try lasix prn   stockings as needed while driving       9/37/90  [facility]  Dr Soyla Murphy   CABGx5   atretic LIMA proximal   distal LIMA free graft to LAD via SVG to circumflex pedicle SVG D1 SVG D2 via hood SVG  D1  SVG OM distal  SVG LPL    EF50% lateral scar   LAA LIGATION 35MM [place], MD   10/12/2017   1:43 PM

## 2017-11-20 ENCOUNTER — Ambulatory Visit
Admit: 2017-11-20 | Discharge: 2017-11-20 | Payer: MEDICARE | Attending: Otolaryngology/Facial Plastic Surgery | Primary: Family Medicine

## 2017-11-20 ENCOUNTER — Ambulatory Visit: Attending: Otolaryngology/Facial Plastic Surgery | Primary: Family Medicine

## 2017-11-20 DIAGNOSIS — C01 Malignant neoplasm of base of tongue: Secondary | ICD-10-CM

## 2017-11-20 NOTE — Progress Notes (Signed)
HPI:  Jermaine Romero is a 67 y.o. male seen in for Follow-up (routine cancer surveillance, h/o tongue cancer, treatment in [place] in 2017, when pt coughs he hurts in back of head, last seen 08/16/17). 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 He recently moved up here from [place] and I saw him initially back in July. Doing well since then. Denies any throat pain or change in swallowing. Still has dry mouth as expected. His only new complaint is a pain along the back of his head which occurs when he coughs- the pain only lasts for a seconds- and it has been going on for past 3 wks.    Past Medical History, Past Surgical History, Family history, Social History, and Medications were all reviewed with the patient today and updated as necessary.     No Known Allergies  Patient Active Problem List   Diagnosis Code   ??? Paroxysmal atrial fibrillation (HCC) I48.0   ??? Coronary artery disease involving native coronary artery of native heart without angina pectoris I25.10   ??? History of MI (myocardial infarction) I25.2   ??? History of anemia Z86.2   ??? Essential hypertension I10   ??? Muscle weakness M62.81   ??? Tongue cancer (HCC) C02.9   ??? 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 Other hyperlipidemia E78.49   ??? History of throat cancer Z85.819     Current Outpatient Medications   Medication Sig   ??? furosemide (LASIX) 20 mg tablet Take 1 Tab by mouth daily as needed for Pain.   ??? glipiZIDE (GLUCOTROL) 10 mg tablet Take 10 mg by mouth daily.   ??? empagliflozin (JARDIANCE) 25 mg tablet Take 1 Tab by mouth daily.   ??? ramipril (ALTACE) 10 mg capsule Take 1 Cap by mouth daily.   ??? clopidogrel (PLAVIX) 75 mg tab Take 1 Tab by mouth nightly.   ??? atorvastatin (LIPITOR) 40 mg tablet Take 1 Tab by mouth daily.   ??? sildenafil, antihypertensive, (REVATIO) 20 mg tablet Take 1 Tab by mouth daily as needed (ed). 1-5 tabs for erectile dysfunction    ??? OTHER,NON-FORMULARY, Take 2 Caps by mouth daily. Indications: T5RX Testosterone Booster   ??? OTHER,NON-FORMULARY, Take 2 Caplet by mouth daily. Indications: Extra Emergency planning/management officer Advanced with Kenard Gower   ??? OTHER,NON-FORMULARY, Take 1 Cap by mouth daily. Indications: [employer] Men's Yoh[employer] 451 Dietary Supplement   ??? OTHER,NON-FORMULARY, Take 2 Caplet by mouth daily. Indications: Nitric Muscle Uptake Maximum Strength Formula   ??? OTHER,NON-FORMULARY, Take 2 Caps by mouth daily. Indications: Perform XT Testosterone Support   ??? OTHER,NON-FORMULARY, Take 2 Caplet by mouth daily. Indications: Super Beta Prostate P3 Advanced Prostate Support Complex     No current facility-administered medications for this visit.      Past Medical History:   Diagnosis Date   ??? CAD (coronary artery disease)    ??? Cancer ([place])     Throat and tounge     Social History     Tobacco Use   ??? Smoking status: Never Smoker   ??? Smokeless tobacco: Never Used   Substance Use Topics   ??? Alcohol use: Yes     Frequency: Monthly or less     Drinks per session: 1 or 2     Binge frequency: Never     Past Surgical History:   Procedure Laterality Date   ??? CARDIAC SURG PROCEDURE UNLIST     ???  HX APPENDECTOMY     ??? HX HERNIA REPAIR     ??? HX ORTHOPAEDIC       History reviewed. No pertinent family history.     ROS:    Review of Systems   Constitutional: Negative for fever.   HENT: Negative for sore throat.    Eyes: Negative for visual disturbance.   Respiratory: Positive for cough. Negative for shortness of breath.    Cardiovascular: Negative for chest pain.   Gastrointestinal: Negative for abdominal pain.   Musculoskeletal: Negative for back pain.   Allergic/Immunologic: Negative for environmental allergies.   Neurological: Negative for dizziness.   Hematological: Negative for adenopathy.        PHYSICAL EXAM:    Visit Vitals  Ht 6\' 2"  (1.88 m)   Wt 262 lb (118.8 kg)   BMI 33.64 kg/m??     General: NAD, well-appearing   Neuro: No gross neuro deficits. CN's II-XII intact. No facial weakness.  Eyes: EOMI. Pupils reactive. No periorbital edema/ecchymosis. No nystagmus.  Skin: No facial erythema, rashes or concerning lesions.  Nose: No external deviations or saddling. Intranasally, septum is midline without perforations, nasal mucosa appears healthy with no erythema, mucopurulence, or polyps.  Mouth: Dentures in place- well-fitting. Moist mucus membranes, normal tongue/palate mobility, no concerning mucosal lesions. Oropharynx reveals changes c/w previous RT. Both tonsillar fossa and BOT is soft on palpation.  Ears: Normal appearing auricles, no hematomas. EACs clear with no cerumen impaction, healthy canal skin, TM's intact with no perforations or retraction pockets. No middle ear effusions.  Neck: Firm along L side from previous RT- no overlying skin changes, no palpable masses. No thyromegaly or palpable thyroid nodules. No surgical scars.  Lymphatics: No palpable cervical LAD.  Resp: No audible stridor or wheezing.  Extremities: No clubbing or cyanosis.  ??  PROCEDURE: Flexible laryngoscopy  INDICATIONS: Dysphagia, h/o tongue SCCA  DESCRIPTION: After verbal consent was obtained and a timeout was performed, the flexible scope was advanced down one of the patient's nares into the nasopharynx. The nasal cavity and nasopharynx were clear. The scope was then turned distally down towards the oropharynx. The BOT and vallecula were clear and the epiglottis was normal in appearance. Some changes c/w previous RT along BOT. Both aryepiglottic folds were clear and there was a normal appearing glottis w/ healthy TVCs. No nodules or polyps and the cords were fully mobile. No concerning lesions seen along post-cricoid region or within either piriform sinus. The posterior pharyngeal was clear as well. The scope was then carefully removed. The patient tolerated the procedure well and there were no complications.      ASSESSMENT and PLAN       ICD-10-CM ICD-9-CM    1. Cancer of base of tongue ([place]) C01 141.0 LARYNGOSCOPY,FLEX FIBER,DIAGNOSTIC     Doing great now over 2 yrs from completion of chemoRT for his L BOT SCCA- NED. RTC in 3 mo for routine cancer surveillance.    Marijo File, MD  11/20/2017

## 2017-11-20 NOTE — Progress Notes (Signed)
HPI:  Jermaine Romero is a 67 y.o. male seen in for Follow-up (routine cancer surveillance, h/o tongue cancer, treatment in [place] in 2017, when pt coughs he hurts in back of head, last seen 08/16/17). 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 He recently moved up here from [place] and I saw him initially back in July. Doing well since then. Denies any throat pain or change in swallowing. Still has dry mouth as expected. His only new complaint is a pain along the back of his head which occurs when he coughs- the pain only lasts for a seconds- and it has been going on for past 3 wks.    Past Medical History, Past Surgical History, Family history, Social History, and Medications were all reviewed with the patient today and updated as necessary.     No Known Allergies  Patient Active Problem List   Diagnosis Code   ??? Paroxysmal atrial fibrillation (HCC) I48.0   ??? Coronary artery disease involving native coronary artery of native heart without angina pectoris I25.10   ??? History of MI (myocardial infarction) I25.2   ??? History of anemia Z86.2   ??? Essential hypertension I10   ??? Muscle weakness M62.81   ??? Tongue cancer (HCC) C02.9   ??? 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 Other hyperlipidemia E78.49   ??? History of throat cancer Z85.819     Current Outpatient Medications   Medication Sig   ??? furosemide (LASIX) 20 mg tablet Take 1 Tab by mouth daily as needed for Pain.   ??? glipiZIDE (GLUCOTROL) 10 mg tablet Take 10 mg by mouth daily.   ??? empagliflozin (JARDIANCE) 25 mg tablet Take 1 Tab by mouth daily.   ??? ramipril (ALTACE) 10 mg capsule Take 1 Cap by mouth daily.   ??? clopidogrel (PLAVIX) 75 mg tab Take 1 Tab by mouth nightly.   ??? atorvastatin (LIPITOR) 40 mg tablet Take 1 Tab by mouth daily.   ??? sildenafil, antihypertensive, (REVATIO) 20 mg tablet Take 1 Tab by mouth daily as needed (ed). 1-5 tabs for erectile dysfunction   ??? OTHER,NON-FORMULARY, Take 2 Caps by mouth daily.  Indications: T5RX Testosterone Booster   ??? OTHER,NON-FORMULARY, Take 2 Caplet by mouth daily. Indications: Extra Emergency planning/management officer Advanced with Kenard Gower   ??? OTHER,NON-FORMULARY, Take 1 Cap by mouth daily. Indications: [employer] Men's Yohimbe 451 Dietary Supplement   ??? OTHER,NON-FORMULARY, Take 2 Caplet by mouth daily. Indications: Nitric Muscle Uptake Maximum Strength Formula   ??? OTHER,NON-FORMULARY, Take 2 Caps by mouth daily. Indications: Perform XT Testosterone Support   ??? OTHER,NON-FORMULARY, Take 2 Caplet by mouth daily. Indications: Super Beta Prostate P3 Advanced Prostate Support Complex     No current facility-administered medications for this visit.      Past Medical History:   Diagnosis Date   ??? CAD (coronary artery disease)    ??? Cancer ([place])     Throat and tounge     Social History     Tobacco Use   ??? Smoking status: Never Smoker   ??? Smokeless tobacco: Never Used   Substance Use Topics   ??? Alcohol use: Yes     Frequency: Monthly or less     Drinks per session: 1 or 2     Binge frequency: Never     Past Surgical History:   Procedure Laterality Date   ??? CARDIAC SURG PROCEDURE UNLIST     ???  HX APPENDECTOMY     ??? HX HERNIA REPAIR     ??? HX ORTHOPAEDIC       History reviewed. No pertinent family history.     ROS:    Review of Systems   Constitutional: Negative for fever.   HENT: Negative for sore throat.    Eyes: Negative for visual disturbance.   Respiratory: Positive for cough. Negative for shortness of breath.    Cardiovascular: Negative for chest pain.   Gastrointestinal: Negative for abdominal pain.   Musculoskeletal: Negative for back pain.   Allergic/Immunologic: Negative for environmental allergies.   Neurological: Negative for dizziness.   Hematological: Negative for adenopathy.        PHYSICAL EXAM:    Visit Vitals  Ht 6\' 2"  (1.88 m)   Wt 262 lb (118.8 kg)   BMI 33.64 kg/m??     General: NAD, well-appearing  Neuro: No gross neuro deficits. CN's II-XII intact. No facial weakness.  Eyes: EOMI. Pupils  reactive. No periorbital edema/ecchymosis. No nystagmus.  Skin: No facial erythema, rashes or concerning lesions.  Nose: No external deviations or saddling. Intranasally, septum is midline without perforations, nasal mucosa appears healthy with no erythema, mucopurulence, or polyps.  Mouth: Dentures in place- well-fitting. Moist mucus membranes, normal tongue/palate mobility, no concerning mucosal lesions. Oropharynx reveals changes c/w previous RT. Both tonsillar fossa and BOT is soft on palpation.  Ears: Normal appearing auricles, no hematomas. EACs clear with no cerumen impaction, healthy canal skin, TM's intact with no perforations or retraction pockets. No middle ear effusions.  Neck: Firm along L side from previous RT- no overlying skin changes, no palpable masses. No thyromegaly or palpable thyroid nodules. No surgical scars.  Lymphatics: No palpable cervical LAD.  Resp: No audible stridor or wheezing.  Extremities: No clubbing or cyanosis.  ??  PROCEDURE: Flexible laryngoscopy  INDICATIONS: Dysphagia, h/o tongue SCCA  DESCRIPTION: After verbal consent was obtained and a timeout was performed, the flexible scope was advanced down one of the patient's nares into the nasopharynx. The nasal cavity and nasopharynx were clear. The scope was then turned distally down towards the oropharynx. The BOT and vallecula were clear and the epiglottis was normal in appearance. Some changes c/w previous RT along BOT. Both aryepiglottic folds were clear and there was a normal appearing glottis w/ healthy TVCs. No nodules or polyps and the cords were fully mobile. No concerning lesions seen along post-cricoid region or within either piriform sinus. The posterior pharyngeal was clear as well. The scope was then carefully removed. The patient tolerated the procedure well and there were no complications.      ASSESSMENT and PLAN      ICD-10-CM ICD-9-CM    1. Cancer of base of tongue ([place]) C01 141.0 LARYNGOSCOPY,FLEX  FIBER,DIAGNOSTIC     Doing great now over 2 yrs from completion of chemoRT for his L BOT SCCA- NED. RTC in 3 mo for routine cancer surveillance.    Marijo File, MD  11/20/2017

## 2018-01-02 MED ORDER — BLOOD SUGAR DIAGNOSTIC TEST STRIPS
ORAL_STRIP | 5 refills | Status: AC
Start: 2018-01-02 — End: ?

## 2018-01-02 NOTE — Telephone Encounter (Signed)
Left VM.

## 2018-01-02 NOTE — Telephone Encounter (Signed)
Pt is calling asking for Dr Colon Flattery  To call in a rx for   One touch ultra 2 test strip and lances.any question please call pt

## 2018-01-02 NOTE — Telephone Encounter (Signed)
Requested Prescriptions     Signed Prescriptions Disp Refills   ??? glucose blood VI test strips (ASCENSIA AUTODISC VI, ONE TOUCH ULTRA TEST VI) strip 30 Strip 5     Sig: by Does Not Apply route See Admin Instructions.     Authorizing Provider: Fenton Foy     Ordering User: Kizzie Fantasia, Matthan Sledge    patient stated he currently does not have a family doctor, would like a month worth sent in until he finds one. I notified patient that the next refill needs to be done at his family doctors. Verbalized understanding.

## 2018-01-09 ENCOUNTER — Telehealth

## 2018-01-09 NOTE — Telephone Encounter (Signed)
Patient called back and said ASAP please   Fax# [phone]

## 2018-01-09 NOTE — Telephone Encounter (Signed)
Needs a referral to a urologist in [place]. Dr Donna Christen [facility]  Phone: [phone]

## 2018-01-09 NOTE — Telephone Encounter (Signed)
OK to refer for GU complaint

## 2018-01-10 NOTE — Telephone Encounter (Signed)
Gave to medicial records to fax referral and records.  01/10/18

## 2018-02-20 ENCOUNTER — Encounter: Attending: Otolaryngology/Facial Plastic Surgery | Primary: Family Medicine

## 2018-09-04 ENCOUNTER — Encounter: Payer: MEDICARE | Attending: Otolaryngology/Facial Plastic Surgery | Primary: Family Medicine

## 2018-09-10 ENCOUNTER — Ambulatory Visit
Admit: 2018-09-10 | Discharge: 2018-09-10 | Payer: MEDICARE | Attending: Otolaryngology/Facial Plastic Surgery | Primary: Family Medicine

## 2018-09-10 ENCOUNTER — Ambulatory Visit: Attending: Otolaryngology/Facial Plastic Surgery | Primary: Family Medicine

## 2018-09-10 DIAGNOSIS — C01 Malignant neoplasm of base of tongue: Secondary | ICD-10-CM

## 2018-09-10 NOTE — Progress Notes (Signed)
HPI:  Jermaine Romero is a 68 y.o. male seen in follow-up for Follow-up (Last visit 11/20/2017.  Swelling in throat for the last week or two.  Hard time swallowing.  Pain on a level 2 scale/  When she turns his head to the right it feels tight.). 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 I last saw him back in Oct '19. Today, he reports some increased swelling and even tightness along L side of neck- esp when turning his head to the side. There is no sore throat but there has been a change in his swallowing as well- mainly w/ solid foods. There is no blood in his saliva or hemoptysis. He has noted a change in his stool w/ melena as well- he is getting set up for a colonoscopy.      Past Medical History, Past Surgical History, Family history, Social History, and Medications were all reviewed with the patient today and updated as necessary.     No Known Allergies  Patient Active Problem List   Diagnosis Code   ??? Paroxysmal atrial fibrillation (HCC) I48.0   ??? Coronary artery disease involving native coronary artery of native heart without angina pectoris I25.10   ??? History of MI (myocardial infarction) I25.2   ??? History of anemia Z86.2   ??? Essential hypertension I10   ??? Muscle weakness M62.81   ??? Tongue cancer (HCC) C02.9   ??? 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 Other hyperlipidemia E78.49   ??? History of throat cancer Z85.819     Current Outpatient Medications   Medication Sig   ??? furosemide (LASIX) 20 mg tablet Take 1 Tab by mouth daily as needed for Pain.   ??? glucose blood VI test strips (ASCENSIA AUTODISC VI, ONE TOUCH ULTRA TEST VI) strip by Does Not Apply route See Admin Instructions.   ??? glipiZIDE (GLUCOTROL) 10 mg tablet Take 10 mg by mouth daily.   ??? empagliflozin (JARDIANCE) 25 mg tablet Take 1 Tab by mouth daily.   ??? ramipril (ALTACE) 10 mg capsule Take 1 Cap by mouth daily.    ??? clopidogrel (PLAVIX) 75 mg tab Take 1 Tab by mouth nightly.   ??? atorvastatin (LIPITOR) 40 mg tablet Take 1 Tab by mouth daily.   ??? OTHER,NON-FORMULARY, Take 2 Caps by mouth daily. Indications: T5RX Testosterone Booster   ??? OTHER,NON-FORMULARY, Take 2 Caplet by mouth daily. Indications: Extra Emergency planning/management officer Advanced with Kenard Gower   ??? OTHER,NON-FORMULARY, Take 1 Cap by mouth daily. Indications: [employer] Men's Yohimbe 451 Dietary Supplement   ??? OTHER,NON-FORMULARY, Take 2 Caplet by mouth daily. Indications: Nitric Muscle Uptake Maximum Strength Formula   ??? OTHER,NON-FORMULARY, Take 2 Caps by mouth daily. Indications: Perform XT Testosterone Support   ??? OTHER,NON-FORMULARY, Take 2 Caplet by mouth daily. Indications: Super Beta Prostate P3 Advanced Prostate Support Complex   ??? sildenafil, antihypertensive, (REVATIO) 20 mg tablet Take 1 Tab by mouth daily as needed (ed). 1-5 tabs for erectile dysfunction     No current facility-administered medications for this visit.      Past Medical History:   Diagnosis Date   ??? CAD (coronary artery disease)    ??? Cancer ([place])     Throat and tounge     Social History     Tobacco Use   ??? Smoking status: Never Smoker   ??? Smokeless  tobacco: Never Used   Substance Use Topics   ??? Alcohol use: Yes     Frequency: Monthly or less     Drinks per session: 1 or 2     Binge frequency: Never     Past Surgical History:   Procedure Laterality Date   ??? CARDIAC SURG PROCEDURE UNLIST     ??? HX APPENDECTOMY     ??? HX HERNIA REPAIR     ??? HX ORTHOPAEDIC       History reviewed. No pertinent family history.     ROS:    Review of Systems   Constitutional: Negative for activity change.   HENT: Negative for postnasal drip and rhinorrhea.         Difficulty Swallowing, Neck tightness, Throat Swelling   Eyes: Negative for discharge.   Respiratory: Negative for apnea.    Cardiovascular: Negative for chest pain.   Gastrointestinal: Negative for abdominal distention.    Endocrine: Negative for cold intolerance.   Genitourinary: Negative for difficulty urinating.   Musculoskeletal: Negative for arthralgias.   Skin: Negative for color change.   Allergic/Immunologic: Negative for environmental allergies.   Neurological: Negative for dizziness.   Hematological: Negative for adenopathy.   Psychiatric/Behavioral: Negative for agitation.        PHYSICAL EXAM:    Visit Vitals  Resp 18   Ht 6\' 2"  (1.88 m)   Wt 262 lb (118.8 kg)   BMI 33.64 kg/m??     General: NAD, well-appearing  Neuro: No gross neuro deficits. CN's II-XII intact. No facial weakness.  Eyes: EOMI. Pupils reactive. No periorbital edema/ecchymosis. No nystagmus.  Skin: No facial erythema, rashes or concerning lesions.  Nose: No external deviations or saddling. Intranasally, septum is midline without perforations, nasal mucosa appears healthy with no erythema, mucopurulence, or polyps.  Mouth:??Dentures in place- well-fitting.??Moist mucus membranes, normal tongue/palate mobility, no concerning mucosal lesions. Oropharynx??reveals changes c/w previous RT. Both tonsillar fossa and BOT is soft on palpation.  Ears: Normal appearing auricles, no hematomas. EACs clear with no cerumen impaction, healthy canal skin, TM's intact with no perforations or retraction pockets. No middle ear effusions.  Neck:??Firm along L side from previous RT- no overlying skin changes, no palpable masses.??No thyromegaly or palpable thyroid nodules. No surgical scars.  Lymphatics: No palpable cervical LAD.  Resp: No audible stridor or wheezing.  Extremities: No clubbing or cyanosis.  ??  PROCEDURE: Flexible laryngoscopy  INDICATIONS:??Dysphagia, h/o tongue SCCA  DESCRIPTION: After verbal consent was obtained and a timeout was performed, the flexible scope was advanced down one of the patient's nares into the nasopharynx. The nasal cavity and nasopharynx were clear. The scope was then turned distally down towards the oropharynx. The BOT and  vallecula were clear and the epiglottis was normal in appearance.??Some changes c/w previous RT along BOT.??Both aryepiglottic folds were clear and there was a normal appearing glottis w/ healthy TVCs. No nodules or polyps and the cords were fully mobile. No concerning lesions seen along post-cricoid region or within either piriform sinus. The posterior pharyngeal was clear as well. The scope was then carefully removed. The patient tolerated the procedure well and there were no complications.  ??    ASSESSMENT and PLAN      ICD-10-CM ICD-9-CM    1. Carcinoma of base of tongue (HCC)  C01 141.0 LARYNGOSCOPY,FLEX FIBER,DIAGNOSTIC      CT NECK SOFT TISSUE W CONT     I did not palpate any obvious masses in his neck but there is  significant fibrosis related to his previous RT and his laryngoscopy was clear w/ no obvious mucosal lesions. I ordered a CT neck today that he will try and get closer to home (he lives in [place]) and I can call him w/ results.     Marijo File, MD  09/10/2018

## 2018-09-10 NOTE — Progress Notes (Signed)
HPI:  Jermaine Romero is a 68 y.o. male seen in follow-up for Follow-up (Last visit 11/20/2017.  Swelling in throat for the last week or two.  Hard time swallowing.  Pain on a level 2 scale/  When she turns his head to the right it feels tight.). 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 I last saw him back in Oct '19. Today, he reports some increased swelling and even tightness along L side of neck- esp when turning his head to the side. There is no sore throat but there has been a change in his swallowing as well- mainly w/ solid foods. There is no blood in his saliva or hemoptysis. He has noted a change in his stool w/ melena as well- he is getting set up for a colonoscopy.      Past Medical History, Past Surgical History, Family history, Social History, and Medications were all reviewed with the patient today and updated as necessary.     No Known Allergies  Patient Active Problem List   Diagnosis Code   ??? Paroxysmal atrial fibrillation (HCC) I48.0   ??? Coronary artery disease involving native coronary artery of native heart without angina pectoris I25.10   ??? History of MI (myocardial infarction) I25.2   ??? History of anemia Z86.2   ??? Essential hypertension I10   ??? Muscle weakness M62.81   ??? Tongue cancer (HCC) C02.9   ??? 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 Other hyperlipidemia E78.49   ??? History of throat cancer Z85.819     Current Outpatient Medications   Medication Sig   ??? furosemide (LASIX) 20 mg tablet Take 1 Tab by mouth daily as needed for Pain.   ??? glucose blood VI test strips (ASCENSIA AUTODISC VI, ONE TOUCH ULTRA TEST VI) strip by Does Not Apply route See Admin Instructions.   ??? glipiZIDE (GLUCOTROL) 10 mg tablet Take 10 mg by mouth daily.   ??? empagliflozin (JARDIANCE) 25 mg tablet Take 1 Tab by mouth daily.   ??? ramipril (ALTACE) 10 mg capsule Take 1 Cap by mouth daily.   ??? clopidogrel (PLAVIX) 75  mg tab Take 1 Tab by mouth nightly.   ??? atorvastatin (LIPITOR) 40 mg tablet Take 1 Tab by mouth daily.   ??? OTHER,NON-FORMULARY, Take 2 Caps by mouth daily. Indications: T5RX Testosterone Booster   ??? OTHER,NON-FORMULARY, Take 2 Caplet by mouth daily. Indications: Extra Emergency planning/management officer Advanced with Kenard Gower   ??? OTHER,NON-FORMULARY, Take 1 Cap by mouth daily. Indications: [employer] Men's Yohimbe 451 Dietary Supplement   ??? OTHER,NON-FORMULARY, Take 2 Caplet by mouth daily. Indications: Nitric Muscle Uptake Maximum Strength Formula   ??? OTHER,NON-FORMULARY, Take 2 Caps by mouth daily. Indications: Perform XT Testosterone Support   ??? OTHER,NON-FORMULARY, Take 2 Caplet by mouth daily. Indications: Super Beta Prostate P3 Advanced Prostate Support Complex   ??? sildenafil, antihypertensive, (REVATIO) 20 mg tablet Take 1 Tab by mouth daily as needed (ed). 1-5 tabs for erectile dysfunction     No current facility-administered medications for this visit.      Past Medical History:   Diagnosis Date   ??? CAD (coronary artery disease)    ??? Cancer ([place])     Throat and tounge     Social History     Tobacco Use   ??? Smoking status: Never Smoker   ??? Smokeless  tobacco: Never Used   Substance Use Topics   ??? Alcohol use: Yes     Frequency: Monthly or less     Drinks per session: 1 or 2     Binge frequency: Never     Past Surgical History:   Procedure Laterality Date   ??? CARDIAC SURG PROCEDURE UNLIST     ??? HX APPENDECTOMY     ??? HX HERNIA REPAIR     ??? HX ORTHOPAEDIC       History reviewed. No pertinent family history.     ROS:    Review of Systems   Constitutional: Negative for activity change.   HENT: Negative for postnasal drip and rhinorrhea.         Difficulty Swallowing, Neck tightness, Throat Swelling   Eyes: Negative for discharge.   Respiratory: Negative for apnea.    Cardiovascular: Negative for chest pain.   Gastrointestinal: Negative for abdominal distention.   Endocrine: Negative for cold intolerance.   Genitourinary: Negative  for difficulty urinating.   Musculoskeletal: Negative for arthralgias.   Skin: Negative for color change.   Allergic/Immunologic: Negative for environmental allergies.   Neurological: Negative for dizziness.   Hematological: Negative for adenopathy.   Psychiatric/Behavioral: Negative for agitation.        PHYSICAL EXAM:    Visit Vitals  Resp 18   Ht 6\' 2"  (1.88 m)   Wt 262 lb (118.8 kg)   BMI 33.64 kg/m??     General: NAD, well-appearing  Neuro: No gross neuro deficits. CN's II-XII intact. No facial weakness.  Eyes: EOMI. Pupils reactive. No periorbital edema/ecchymosis. No nystagmus.  Skin: No facial erythema, rashes or concerning lesions.  Nose: No external deviations or saddling. Intranasally, septum is midline without perforations, nasal mucosa appears healthy with no erythema, mucopurulence, or polyps.  Mouth:??Dentures in place- well-fitting.??Moist mucus membranes, normal tongue/palate mobility, no concerning mucosal lesions. Oropharynx??reveals changes c/w previous RT. Both tonsillar fossa and BOT is soft on palpation.  Ears: Normal appearing auricles, no hematomas. EACs clear with no cerumen impaction, healthy canal skin, TM's intact with no perforations or retraction pockets. No middle ear effusions.  Neck:??Firm along L side from previous RT- no overlying skin changes, no palpable masses.??No thyromegaly or palpable thyroid nodules. No surgical scars.  Lymphatics: No palpable cervical LAD.  Resp: No audible stridor or wheezing.  Extremities: No clubbing or cyanosis.  ??  PROCEDURE: Flexible laryngoscopy  INDICATIONS:??Dysphagia, h/o tongue SCCA  DESCRIPTION: After verbal consent was obtained and a timeout was performed, the flexible scope was advanced down one of the patient's nares into the nasopharynx. The nasal cavity and nasopharynx were clear. The scope was then turned distally down towards the oropharynx. The BOT and vallecula were clear and the epiglottis was normal in appearance.??Some changes c/w previous  RT along BOT.??Both aryepiglottic folds were clear and there was a normal appearing glottis w/ healthy TVCs. No nodules or polyps and the cords were fully mobile. No concerning lesions seen along post-cricoid region or within either piriform sinus. The posterior pharyngeal was clear as well. The scope was then carefully removed. The patient tolerated the procedure well and there were no complications.  ??    ASSESSMENT and PLAN      ICD-10-CM ICD-9-CM    1. Carcinoma of base of tongue (HCC)  C01 141.0 LARYNGOSCOPY,FLEX FIBER,DIAGNOSTIC      CT NECK SOFT TISSUE W CONT     I did not palpate any obvious masses in his neck but there is  significant fibrosis related to his previous RT and his laryngoscopy was clear w/ no obvious mucosal lesions. I ordered a CT neck today that he will try and get closer to home (he lives in [place]) and I can call him w/ results.     Marijo File, MD  09/10/2018

## 2018-12-31 DIAGNOSIS — K625 Hemorrhage of anus and rectum: Secondary | ICD-10-CM | POA: Diagnosis not present

## 2018-12-31 DIAGNOSIS — L03031 Cellulitis of right toe: Secondary | ICD-10-CM | POA: Diagnosis not present

## 2018-12-31 DIAGNOSIS — R197 Diarrhea, unspecified: Secondary | ICD-10-CM | POA: Diagnosis not present

## 2019-01-07 ENCOUNTER — Encounter: Payer: Self-pay | Admitting: Gastroenterology

## 2019-02-05 DIAGNOSIS — H5213 Myopia, bilateral: Secondary | ICD-10-CM | POA: Diagnosis not present

## 2019-02-12 ENCOUNTER — Other Ambulatory Visit (INDEPENDENT_AMBULATORY_CARE_PROVIDER_SITE_OTHER): Payer: Medicare HMO

## 2019-02-12 ENCOUNTER — Other Ambulatory Visit: Payer: Self-pay

## 2019-02-12 ENCOUNTER — Ambulatory Visit (INDEPENDENT_AMBULATORY_CARE_PROVIDER_SITE_OTHER): Payer: Medicare HMO | Admitting: Gastroenterology

## 2019-02-12 ENCOUNTER — Encounter: Payer: Self-pay | Admitting: Gastroenterology

## 2019-02-12 VITALS — BP 132/82 | Temp 98.3°F | Ht 74.0 in | Wt 255.6 lb

## 2019-02-12 DIAGNOSIS — Z01818 Encounter for other preprocedural examination: Secondary | ICD-10-CM | POA: Diagnosis not present

## 2019-02-12 DIAGNOSIS — K921 Melena: Secondary | ICD-10-CM

## 2019-02-12 DIAGNOSIS — K529 Noninfective gastroenteritis and colitis, unspecified: Secondary | ICD-10-CM | POA: Diagnosis not present

## 2019-02-12 LAB — COMPREHENSIVE METABOLIC PANEL
ALT: 27 U/L (ref 0–53)
AST: 27 U/L (ref 0–37)
Albumin: 4.2 g/dL (ref 3.5–5.2)
Alkaline Phosphatase: 122 U/L — ABNORMAL HIGH (ref 39–117)
BUN: 21 mg/dL (ref 6–23)
CO2: 30 mEq/L (ref 19–32)
Calcium: 9.6 mg/dL (ref 8.4–10.5)
Chloride: 100 mEq/L (ref 96–112)
Creatinine, Ser: 1.18 mg/dL (ref 0.40–1.50)
GFR: 60.89 mL/min (ref >60.00)
Glucose, Bld: 257 mg/dL — ABNORMAL HIGH (ref 70–99)
Potassium: 4.1 mEq/L (ref 3.5–5.1)
Sodium: 137 mEq/L (ref 135–145)
Total Bilirubin: 0.8 mg/dL (ref 0.2–1.2)
Total Protein: 7.5 g/dL (ref 6.0–8.3)

## 2019-02-12 LAB — CBC WITH DIFFERENTIAL/PLATELET
Basophils Absolute: 0 10*3/uL (ref 0.0–0.1)
Basophils Relative: 0.9 % (ref 0.0–3.0)
Eosinophils Absolute: 0.3 10*3/uL (ref 0.0–0.7)
Eosinophils Relative: 5.1 % — ABNORMAL HIGH (ref 0.0–5.0)
HCT: 40.5 % (ref 39.0–52.0)
Hemoglobin: 13.1 g/dL (ref 13.0–17.0)
Lymphocytes Relative: 14.3 % (ref 12.0–46.0)
Lymphs Abs: 0.7 10*3/uL (ref 0.7–4.0)
MCHC: 32.5 g/dL (ref 30.0–36.0)
MCV: 83.1 fl (ref 78.0–100.0)
Monocytes Absolute: 0.5 10*3/uL (ref 0.1–1.0)
Monocytes Relative: 10.7 % (ref 3.0–12.0)
Neutro Abs: 3.5 10*3/uL (ref 1.4–7.7)
Neutrophils Relative %: 69 % (ref 43.0–77.0)
Platelets: 214 10*3/uL (ref 150.0–400.0)
RBC: 4.87 Mil/uL (ref 4.22–5.81)
RDW: 14.4 % (ref 11.5–15.5)
WBC: 5 10*3/uL (ref 4.0–10.5)

## 2019-02-12 LAB — FERRITIN: Ferritin: 52 ng/mL (ref 22.0–322.0)

## 2019-02-12 LAB — B12 AND FOLATE PANEL
Folate: 12.6 ng/mL (ref >5.9)
Vitamin B-12: 788 pg/mL (ref 211–911)

## 2019-02-12 LAB — IBC PANEL
Iron: 79 ug/dL (ref 42–165)
Saturation Ratios: 28.2 % (ref 20.0–50.0)
Transferrin: 200 mg/dL — ABNORMAL LOW (ref 212.0–360.0)

## 2019-02-12 MED ORDER — OMEPRAZOLE 40 MG PO CPDR: 40.0000 mg | DELAYED_RELEASE_CAPSULE | Freq: Every day | ORAL | 1 refills | Status: DC

## 2019-02-12 MED ORDER — SUPREP BOWEL PREP KIT 17.5-3.13-1.6 GM/177ML PO SOLN: 1.0000 | ORAL | 0 refills | Status: DC

## 2019-02-12 NOTE — Patient Instructions (Addendum)
If you are age 71 or older, your body mass index should be between 23-30. Your Body mass index is 32.82 kg/m. If this is out of the aforementioned range listed, please consider follow up with your Primary Care Provider.  If you are age 51 or younger, your body mass index should be between 19-25. Your Body mass index is 32.82 kg/m. If this is out of the aformentioned range listed, please consider follow up with your Primary Care Provider.   Your provider has requested that you go to the basement level for lab work before leaving today. Press "B" on the elevator. The lab is located at the first door on the left as you exit the elevator.  You have been scheduled for an endoscopy and colonoscopy. Please follow the written instructions given to you at your visit today. Please pick up your prep supplies at the pharmacy within the next 1-3 days. If you use inhalers (even only as needed), please bring them with you on the day of your procedure.  We have sent the following medications to your pharmacy for you to pick up at your convenience:   START: omeprazole 40mg  one tablet daily.  Due to recent changes in healthcare laws, you may see the results of your imaging and laboratory studies on MyChart before your provider has had a chance to review them.  We understand that in some cases there may be results that are confusing or concerning to you. Not all laboratory results come back in the same time frame and the provider may be waiting for multiple results in order to interpret others.  Please give [place] 48 hours in order for your provider to thoroughly review all the results before contacting the office for clarification of your results.   It was a pleasure to see you today!  Dr. Loletha Carrow

## 2019-02-12 NOTE — Progress Notes (Signed)
[facility] Consult Note:  History: Clarence Andrews. 02/12/2019  Referring provider: [place], [place]  Reason for consult/chief complaint: Rectal Bleeding (with black tarry stools, patient has never had a colonoscopy) and Diarrhea   Subjective  HPI: Visit 12/31/18 with referring - 2 wks bloody diarrhea and mucus.  Hx "colitis".  Rx with cipro/flagyl.  FOBT positive that day.  No labs done. Poor health literacy, no prior primary care or cardiology records.  He says that diarrhea resolved, he thinks it may have been some bad sausage he ate.  However, for the last 2 to 3 weeks he has had black tarry stool nearly every day.  He does not recall having had something like that in the past.  Stools are intermittently loose.  He denies abdominal pain, nausea, vomiting, dysphagia, early satiety anorexia or weight loss. He has not established with primary care since moving to this area from [facility] last year.  Tells me that sometime last year his last PCP told him he was anemic and should take iron.  The above-noted provider was in urgent care he saw in [place].  He had a heart bypass several years ago in [place], and says his last cardiology visit was perhaps 9 months ago.  He does not take aspirin, and says he does not recall being told to do so.  He does not take Advil or other NSAIDs, no Goody powder or BC powder. He has had no prior colonoscopy. Denies exertional chest pain or dyspnea  ROS:  Review of Systems  Constitutional: Negative for appetite change and unexpected weight change.  HENT: Negative for mouth sores and voice change.   Eyes: Negative for pain and redness.  Respiratory: Negative for cough and shortness of breath.   Cardiovascular: Negative for chest pain and palpitations.  Genitourinary: Negative for dysuria and hematuria.  Musculoskeletal: Positive for arthralgias. Negative for myalgias.  Skin: Negative for pallor  and rash.  Neurological: Negative for weakness and headaches.  Hematological: Negative for adenopathy.  Intermittent use of ranitidine over the years for heartburn   Past Medical History: Past Medical History:  Diagnosis Date  . Cancer ([place])   . Cardiac arrhythmia due to congenital heart disease   . CHF (congestive heart failure) ([place])   . Colitis   . Diabetes ([place])   . Paronychia of second toe of right foot   . Throat cancer [facility])    Reports that his heart bypass was done in [place], and his surgery for tongue/throat cancer was done in [place] as well perhaps 3 to 5 years ago.   Past Surgical History: Past Surgical History:  Procedure Laterality Date  . APPENDECTOMY  1970  . CORONARY ARTERY BYPASS GRAFT     5 vessel   . TOOTH EXTRACTION       Family History: Family History  Problem Relation Age of Onset  . Breast cancer Mother   . Colon polyps Father   . Heart disease Father   No known family history of colorectal cancer or gastric cancer  Social History: Social History   Socioeconomic History  . Marital status: Single    Spouse name: Not on file  . Number of children: Not on file  . Years of education: Not on file  . Highest education level: Not on file  Occupational History  . Not on file  Tobacco Use  . Smoking status: Never Smoker  . Smokeless tobacco: Never Used  Substance and Sexual  Activity  . Alcohol use: Not Currently  . Drug use: Never  . Sexual activity: Not on file  Other Topics Concern  . Not on file  Social History Narrative  . Not on file   Social Determinants of Health   Financial Resource Strain:   . Difficulty of Paying Living Expenses: Not on file  Food Insecurity:   . Worried About Charity fundraiser in the Last Year: Not on file  . Ran Out of Food in the Last Year: Not on file  Transportation Needs:   . Lack of Transportation (Medical): Not on file  . Lack of Transportation (Non-Medical): Not on file  Physical Activity:     . Days of Exercise per Week: Not on file  . Minutes of Exercise per Session: Not on file  Stress:   . Feeling of Stress : Not on file  Social Connections:   . Frequency of Communication with Friends and Family: Not on file  . Frequency of Social Gatherings with Friends and Family: Not on file  . Attends Religious Services: Not on file  . Active Member of Clubs or Organizations: Not on file  . Attends Archivist Meetings: Not on file  . Marital Status: Not on file   Lives alone, no family in the area  Allergies: No Known Allergies  Outpatient Meds: Current Outpatient Medications  Medication Sig Dispense Refill  . ferrous sulfate 325 (65 FE) MG EC tablet Take 325 mg by mouth daily with breakfast.    . glipiZIDE (GLUCOTROL) 10 MG tablet Take 10 mg by mouth daily before breakfast.    . Na Sulfate-K Sulfate-Mg Sulf (SUPREP BOWEL PREP KIT) 17.5-3.13-1.6 GM/177ML SOLN Take 1 kit by mouth as directed. 324 mL 0  . omeprazole (PRILOSEC) 40 MG capsule Take 1 capsule (40 mg total) by mouth daily. 30 capsule 1   No current facility-administered medications for this visit.   OTC iron tablet once daily, unknown dose   ___________________________________________________________________ Objective   Exam:  BP 132/82 (BP Location: Left Arm)   Temp 98.3 F (36.8 C)   Ht _0  (1.88 m)   Wt 255 lb 9.6 oz (115.9 kg)   BMI 32.82 kg/m    General: Pleasant, conversational, no acute distress  Eyes: sclera anicteric, no redness  ENT: oral mucosa moist without lesions, no cervical or supraclavicular lymphadenopathy  CV: RRR without murmur, S1/S2, no JVD, + bilateral mild peripheral edema and venous stasis changes  Resp: clear to auscultation bilaterally, normal RR and effort noted  GI: soft, no tenderness, with active bowel sounds. No guarding or palpable organomegaly noted.  Rectus diastasis  Skin; warm and dry, no rash or jaundice noted.  Neuro: awake, alert and oriented x  3. Normal gross motor function and fluent speech Rectal: Scant gritty black stool, heme positive, no palpable internal lesions, no perianal disease seen  Data:  No data for review  Assessment: Encounter Diagnoses  Name Primary?  . Melena Yes  . Chronic diarrhea   . Encounter for other preprocedural examination     Recent description of melena, heme positive on exam.  No chronic medical care or recent work-up. Intermittent diarrhea of unclear cause.  Plan:  Lab today for CBC, CMP, iron studies, B12 and folate.  With those results, we can then make further recommendations on dosing of the iron tablets. He needs an EGD and colonoscopy to evaluate source of GI blood loss.  Procedures were described along with risks and benefits  and he was agreeable.  However, he has no family in the area and does not know if he would have anybody to bring him for the procedure.  We gave him information regarding the [facility] care partner service, and I strongly recommended he use them in order to have his procedures done this month.  In case this is an upper digestive source of GI blood loss, I prescribed omeprazole 40 mg once daily and recommended he start it today.  (Total time 60 minutes, over half spent face-to-face with patient.) Thank you for the courtesy of this consult.  Please call me with any questions or concerns.  Nelida Meuse III  CC: Referring provider noted above

## 2019-03-05 ENCOUNTER — Ambulatory Visit (INDEPENDENT_AMBULATORY_CARE_PROVIDER_SITE_OTHER): Payer: Medicare HMO

## 2019-03-05 ENCOUNTER — Other Ambulatory Visit: Payer: Self-pay | Admitting: Gastroenterology

## 2019-03-05 DIAGNOSIS — Z1159 Encounter for screening for other viral diseases: Secondary | ICD-10-CM | POA: Diagnosis not present

## 2019-03-06 LAB — SARS CORONAVIRUS 2 (TAT 6-24 HRS): SARS Coronavirus 2: NEGATIVE

## 2019-03-07 ENCOUNTER — Encounter: Payer: Self-pay | Admitting: Gastroenterology

## 2019-03-07 ENCOUNTER — Ambulatory Visit: Payer: Medicare HMO | Admitting: Gastroenterology

## 2019-03-07 ENCOUNTER — Ambulatory Visit (AMBULATORY_SURGERY_CENTER): Payer: Medicare HMO | Admitting: Gastroenterology

## 2019-03-07 ENCOUNTER — Other Ambulatory Visit: Payer: Self-pay

## 2019-03-07 VITALS — BP 134/71 | HR 88 | Temp 97.1°F | Resp 14 | Ht 74.0 in | Wt 255.0 lb

## 2019-03-07 DIAGNOSIS — K921 Melena: Secondary | ICD-10-CM | POA: Diagnosis not present

## 2019-03-07 DIAGNOSIS — D125 Benign neoplasm of sigmoid colon: Secondary | ICD-10-CM

## 2019-03-07 DIAGNOSIS — R6889 Other general symptoms and signs: Secondary | ICD-10-CM | POA: Diagnosis not present

## 2019-03-07 DIAGNOSIS — C20 Malignant neoplasm of rectum: Secondary | ICD-10-CM

## 2019-03-07 DIAGNOSIS — E119 Type 2 diabetes mellitus without complications: Secondary | ICD-10-CM | POA: Diagnosis not present

## 2019-03-07 DIAGNOSIS — K219 Gastro-esophageal reflux disease without esophagitis: Secondary | ICD-10-CM | POA: Diagnosis not present

## 2019-03-07 DIAGNOSIS — K625 Hemorrhage of anus and rectum: Secondary | ICD-10-CM | POA: Diagnosis not present

## 2019-03-07 DIAGNOSIS — K529 Noninfective gastroenteritis and colitis, unspecified: Secondary | ICD-10-CM

## 2019-03-07 DIAGNOSIS — K6289 Other specified diseases of anus and rectum: Secondary | ICD-10-CM

## 2019-03-07 MED ORDER — SODIUM CHLORIDE 0.9 % IV SOLN: 500.0000 mL | Freq: Once | INTRAVENOUS | Status: DC

## 2019-03-07 NOTE — Op Note (Signed)
[facility] Patient Name: Clarence Andrews Procedure Date: 03/07/2019 3:03 PM MRN: [id number] Endoscopist: Mallie Mussel L. Loletha Carrow , MD Age: 69 Referring MD:  Date of Birth: Dec 07, 1950 Gender: Male Account #: [id number] Procedure:                Colonoscopy Indications:              Melena, Change in bowel habits Medicines:                Monitored Anesthesia Care Procedure:                Pre-Anesthesia Assessment:                           - Prior to the procedure, a History and Physical                            was performed, and patient medications and                            allergies were reviewed. The patient's tolerance of                            previous anesthesia was also reviewed. The risks                            and benefits of the procedure and the sedation                            options and risks were discussed with the patient.                            All questions were answered, and informed consent                            was obtained. Prior Anticoagulants: The patient has                            taken no previous anticoagulant or antiplatelet                            agents. ASA Grade Assessment: III - A patient with                            severe systemic disease. After reviewing the risks                            and benefits, the patient was deemed in                            satisfactory condition to undergo the procedure.                           After obtaining informed consent, the colonoscope  was passed under direct vision. Throughout the                            procedure, the patient's blood pressure, pulse, and                            oxygen saturations were monitored continuously. The                            Endoscope was introduced through the anus and                            advanced to the the descending colon. The                            colonoscopy was performed with difficulty  due to                            poor prep and obstructing rectal mass. The patient                            tolerated the procedure well. The quality of the                            bowel preparation was poor. The rectum and                            descending colon (extent of insertion) were                            photographed. Scope In: Scope Out: Findings:                 The perianal and digital rectal examinations were                            normal.                           A fungating, infiltrative and ulcerated partially                            (nearly completely) obstructing mass was found in                            the proximal rectum. The mass was circumferential.                            The mass measured approximately six cm in length                            (16-21 cm from anal verge). Area was tattooed with                            an injection of 2  mL of Spot - two injectionsof 0.5                            ml each about 5-7 cm proximal to the mass and two                            more 3-5 cm distal to the mass.                           The adult colonoscope was unable to pass the                            obstructing mass, and the adult EGD scope (20mm                            diameter) was barely able to pass with gentle                            steady scope pressure. This scope could only be                            advanced to the descending colon. Biopsies were                            taken with a cold forceps for histology.                           A 10 mm polyp was found in the sigmoid colon. The                            polyp was pedunculated. It was not removed (as it                            will be included in eventual surgical resection                            specimen). The proximal tattoos are just proximal                            to this polyp. Complications:            No immediate  complications. Estimated Blood Loss:     Estimated blood loss was minimal. 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 Tattooed. Biopsied.                           - One 10 mm polyp in the sigmoid colon. Recommendation:           - Patient has a contact number available for                            emergencies. The signs and symptoms of potential                            delayed complications were discussed with the                            patient. Return to normal activities tomorrow.                            Written discharge instructions were provided to the                            patient.                           - Low fiber diet.                           - Miralax 1 capful (17 grams) in 8 ounces of water                            by mouth twice daily to keep stool soft.                           - Perform a CT scan (computed tomography) of chest                            with contrast, abdomen with contrast and pelvis                            with contrast at appointment to be scheduled. Eura Radabaugh L. Loletha Carrow, MD 03/07/2019 3:55:59 PM This report has been signed electronically.

## 2019-03-07 NOTE — Progress Notes (Signed)
Pt given contrast and instructions for CT, explained to pt to have the paper nearby when they call to schedule the CT, pt state he will get miralax eventhough his check has not come in for ss income.  No samples were available at the office.

## 2019-03-07 NOTE — Progress Notes (Signed)
This patient arrived for his EGD/colonoscopy without a proper care partner.  Had been given [facility] service contact information at recent office visit. He had arranged a Artist through [place], individuals who did not stay and could not be reached after arrival to ensure they would provide comprehensive service bringing him into his home afterwards.  Procedure canceled, and offer made to re-schedule at a later date when adequate care partner available.  He was upset and declined to reschedule, was unhappy about the bowel prep experience and claiming he would not do that again.   Offer left open to him to reschedule if he changes his mind.

## 2019-03-07 NOTE — Progress Notes (Signed)
Called to room to assist during endoscopic procedure.  Patient ID and intended procedure confirmed with present staff. Received instructions for my participation in the procedure from the performing physician.  

## 2019-03-07 NOTE — Progress Notes (Signed)
I spoke with patient upon arrival for his procedure.  Arrived via [place] [facility] transport. Explained to him our policy that a care partner is to be on the premises for the duration of his appointment.  He states that he has no one that can come with him.  He called [employer] to see if they can stay with him for appointment but they tell him they are unable to provide that service.  Pt very upset and states that he had a terrible night with the prep and does not wish to reschedule.  Dr. Loletha Carrow spoke with patient and options were discussed but patient still decided to decline rescheduling procedure.

## 2019-03-07 NOTE — Progress Notes (Signed)
Temp taken by JB VS taken by DT 

## 2019-03-07 NOTE — Patient Instructions (Signed)
Low fiber diet  Start Miralax 1 scoop twice daily in 8 ounces of water  CT scan to be scheduled  YOU HAD AN ENDOSCOPIC PROCEDURE TODAY AT [facility]:   Refer to the procedure report that was given to you for any specific questions about what was found during the examination.  If the procedure report does not answer your questions, please call your gastroenterologist to clarify.  If you requested that your care partner not be given the details of your procedure findings, then the procedure report has been included in a sealed envelope for you to review at your convenience later.  YOU SHOULD EXPECT: Some feelings of bloating in the abdomen. Passage of more gas than usual.  Walking can help get rid of the air that was put into your GI tract during the procedure and reduce the bloating. If you had a lower endoscopy (such as a colonoscopy or flexible sigmoidoscopy) you may notice spotting of blood in your stool or on the toilet paper. If you underwent a bowel prep for your procedure, you may not have a normal bowel movement for a few days.  Please Note:  You might notice some irritation and congestion in your nose or some drainage.  This is from the oxygen used during your procedure.  There is no need for concern and it should clear up in a day or so.  SYMPTOMS TO REPORT IMMEDIATELY:   Following lower endoscopy (colonoscopy or flexible sigmoidoscopy):  Excessive amounts of blood in the stool  Significant tenderness or worsening of abdominal pains  Swelling of the abdomen that is new, acute  Fever of 100F or higher   Following upper endoscopy (EGD)  Vomiting of blood or coffee ground material  New chest pain or pain under the shoulder blades  Painful or persistently difficult swallowing  New shortness of breath  Fever of 100F or higher  Black, tarry-looking stools  For urgent or emergent issues, a gastroenterologist can be reached at any hour by calling [phone]  [phone].   DIET:  We do recommend a small meal at first, but then you may proceed to your regular diet.  Drink plenty of fluids but you should avoid alcoholic beverages for 24 hours.  ACTIVITY:  You should plan to take it easy for the rest of today and you should NOT DRIVE or use heavy machinery until tomorrow (because of the sedation medicines used during the test).    FOLLOW UP: Our staff will call the number listed on your records 48-72 hours following your procedure to check on you and address any questions or concerns that you may have regarding the information given to you following your procedure. If we do not reach you, we will leave a message.  We will attempt to reach you two times.  During this call, we will ask if you have developed any symptoms of COVID 19. If you develop any symptoms (ie: fever, flu-like symptoms, shortness of breath, cough etc.) before then, please call [phone].  If you test positive for Covid 19 in the 2 weeks post procedure, please call and report this information to [place].    If any biopsies were taken you will be contacted by phone or by letter within the next 1-3 weeks.  Please call us at [phone] if you have not heard about the biopsies in 3 weeks.    SIGNATURES/CONFIDENTIALITY: You and/or your care partner have signed paperwork which will be entered into your electronic medical record.  These signatures attest to the fact that that the information above on your After Visit Summary has been reviewed and is understood.  Full responsibility of the confidentiality of this discharge information lies with you and/or your care-partner.

## 2019-03-07 NOTE — Progress Notes (Signed)
Report to PACU, RN, vss, BBS= Clear.  

## 2019-03-08 ENCOUNTER — Other Ambulatory Visit: Payer: Self-pay | Admitting: *Deleted

## 2019-03-08 ENCOUNTER — Telehealth: Payer: Self-pay | Admitting: *Deleted

## 2019-03-08 DIAGNOSIS — K6289 Other specified diseases of anus and rectum: Secondary | ICD-10-CM

## 2019-03-08 DIAGNOSIS — C20 Malignant neoplasm of rectum: Secondary | ICD-10-CM

## 2019-03-08 NOTE — Telephone Encounter (Signed)
-----   Message from Doran Stabler, MD sent at 03/07/2019  5:37 PM EST ----- Malignant-appearing rectal mass found on sigmoidoscopy today. Biopsies sent rush. I am out of the office 2/5, so if there is a pathology call, please ask DOD to address and inform patient.  Patient aware this mass appears likely malignant.  Please arrange CBC, CMP, CEA and CT scan chest,abdomen and pelvis with oral and IV contrast for next week. Patient was given CT oral contrast in [place].  If malignancy confirmed on biopsy, please sent urgent referral to both oncology and general surgery.  The tumor is almost completely obstructing, and will need surgical resection soon.  - HD

## 2019-03-08 NOTE — Telephone Encounter (Signed)
Labs have been placed for the CBC, CMP and CEA. Patient aware and will be in on Monday.   CT chest/abd/pelvis scheduled 03/15/19 at 1:30 pm. Patient aware of instructions which were verbally given over the phone and sent via [facility].   Per Dr. Loletha Carrow, will await biopsy results prior to sending referrals to oncology and general surgery.

## 2019-03-08 NOTE — Telephone Encounter (Signed)
Per pathology report, confirmed adenocarcinoma. States on pathology report, Dr. Loletha Carrow was notified of the results.   Urgent referral sent to [facility] and urgent surgical referral sent to [facility].   Referral coordinator contact via staff message about the urgent referral. Will follow up Monday to ensure the patient has been scheduled.

## 2019-03-11 ENCOUNTER — Telehealth: Payer: Self-pay

## 2019-03-11 NOTE — Telephone Encounter (Signed)
Dr. Loletha Carrow, please see my staff message.   This patient received your call and VM message. Please return call.

## 2019-03-11 NOTE — Telephone Encounter (Addendum)
Patient notified of results. [name] had a lot of question in which I unfortunately could not answer. He expected the results but was still shocked and saddened.    Also, confirmed appt has been scheduled with [facility] on 03/18/19 at 1:45 pm with Dr. Dema Severin.

## 2019-03-11 NOTE — Telephone Encounter (Signed)
Noted  

## 2019-03-11 NOTE — Telephone Encounter (Signed)
Thank you.  I will attempt to reach him as soon as I am able to answer his questions.

## 2019-03-11 NOTE — Telephone Encounter (Signed)
I tried him just now and he did not answer.  As I am currently attending to several urgent matters on inpatients while managing the hospital consult service, it will be difficult for me to continue trying during the day today, and I would like him to have the news.  Please attempt to reach this patient today and give him the biopsy result.  Given the long conversation he and I had after his procedure, he should be expecting this result.  And if insurance authorization will not allow earlier CT scan scheduling, then I certainly understand.  We will do the best we can.

## 2019-03-11 NOTE — Telephone Encounter (Signed)
  Follow up Call-  Call back number 03/07/2019  Post procedure Call Back phone  # [phone]  Permission to leave phone message Yes     Patient questions:  Do you have a fever, pain , or abdominal swelling? No. Pain Score  0 *  Have you tolerated food without any problems? Yes.    Have you been able to return to your normal activities? Yes.    Do you have any questions about your discharge instructions: Diet   No. Medications  No. Follow up visit  No.  Do you have questions or concerns about your Care? No.  Actions: * If pain score is 4 or above: No action needed, pain <4.    1. Have you developed a fever since your procedure? No  2.   Have you had an respiratory symptoms (SOB or cough) since your procedure? No  3.   Have you tested positive for COVID 19 since your procedure? No  4.   Have you had any family members/close contacts diagnosed with the COVID 19 since your procedure? No   If yes to any of these questions please route to Joylene Damondre, RN and Alphonsa Gin, RN.

## 2019-03-11 NOTE — Telephone Encounter (Signed)
Clarence Andrews,  This patient's colon biopsy result came in on Friday, though I was out of the office that day.  It appears that the labs and CT scan have been ordered and consults placed to oncology.  It just was not clear to me whether the patient had been reached by phone to give him the final biopsy results.  I was unable to reach him this morning, and left him a voicemail only to ask him to call you sometime today to confirm biopsy results on the current plan.  I am at the hospital on consult service all week, and just want to make sure this is attended to in a timely fashion.  Also, Dr. Burr Medico of oncology asked if it would be possible to move this patient's CT scan up to earlier day this week.  It is currently scheduled for Friday the 12th.  - HD

## 2019-03-11 NOTE — Op Note (Signed)
[place] Patient Name: Clarence Andrews Procedure Date: 03/07/2019 3:04 PM MRN: [id number] Endoscopist: [place] Loletha Carrow , MD Age: 69 Referring MD:  Date of Birth: 1950-03-02 Gender: Male Account #: [id number] Procedure:                Upper GI endoscopy Indications:              [place] Medicines:                Monitored Anesthesia Care Procedure:                Pre-Anesthesia Assessment:                           - Prior to the procedure, a History and Physical                            was performed, and patient medications and                            allergies were reviewed. The patient's tolerance of                            previous anesthesia was also reviewed. The risks                            and benefits of the procedure and the sedation                            options and risks were discussed with the patient.                            All questions were answered, and informed consent                            was obtained. Prior Anticoagulants: The patient has                            taken no previous anticoagulant or antiplatelet                            agents. ASA Grade Assessment: III - A patient with                            severe systemic disease. After reviewing the risks                            and benefits, the patient was deemed in                            satisfactory condition to undergo the procedure.                           After obtaining informed consent, the endoscope was  passed under direct vision. Throughout the                            procedure, the patient's blood pressure, pulse, and                            oxygen saturations were monitored continuously. The                            Endoscope was introduced through the mouth, and                            advanced to the second part of duodenum. The upper                            GI endoscopy was accomplished without difficulty.                             The patient tolerated the procedure well. Scope In: Scope Out: Findings:                 The esophagus was normal.                           The stomach was normal.                           The cardia and gastric fundus were normal on                            retroflexion.                           The examined duodenum was normal. Complications:            No immediate complications. Estimated Blood Loss:     Estimated blood loss: none. Impression:               - Normal esophagus.                           - Normal stomach.                           - Normal examined duodenum.                           - No specimens collected. Recommendation:           - Patient has a contact number available for                            emergencies. The signs and symptoms of potential                            delayed complications were discussed with the  patient. Return to normal activities tomorrow.                            Written discharge instructions were provided to the                            patient.                           - Resume previous diet.                           - Continue present medications.                           - See the other procedure note for documentation of                            additional recommendations. [name] Loletha Carrow, MD 03/07/2019 3:44:29 PM This report has been signed electronically.

## 2019-03-11 NOTE — Telephone Encounter (Signed)
Patient returning call to [facility] per Dr Loletha Carrow voicemail

## 2019-03-11 NOTE — Telephone Encounter (Signed)
I spoke with him and answered all questions. Understands plans and ready to proceed.

## 2019-03-13 ENCOUNTER — Other Ambulatory Visit (INDEPENDENT_AMBULATORY_CARE_PROVIDER_SITE_OTHER): Payer: Medicare HMO

## 2019-03-13 DIAGNOSIS — K6289 Other specified diseases of anus and rectum: Secondary | ICD-10-CM

## 2019-03-13 DIAGNOSIS — C186 Malignant neoplasm of descending colon: Secondary | ICD-10-CM | POA: Diagnosis not present

## 2019-03-13 LAB — COMPREHENSIVE METABOLIC PANEL
ALT: 26 U/L (ref 0–53)
AST: 28 U/L (ref 0–37)
Albumin: 4.3 g/dL (ref 3.5–5.2)
Alkaline Phosphatase: 119 U/L — ABNORMAL HIGH (ref 39–117)
BUN: 16 mg/dL (ref 6–23)
CO2: 31 mEq/L (ref 19–32)
Calcium: 9.8 mg/dL (ref 8.4–10.5)
Chloride: 97 mEq/L (ref 96–112)
Creatinine, Ser: 1.24 mg/dL (ref 0.40–1.50)
GFR: 57.49 mL/min — ABNORMAL LOW (ref >60.00)
Glucose, Bld: 160 mg/dL — ABNORMAL HIGH (ref 70–99)
Potassium: 3.6 mEq/L (ref 3.5–5.1)
Sodium: 135 mEq/L (ref 135–145)
Total Bilirubin: 0.6 mg/dL (ref 0.2–1.2)
Total Protein: 8.1 g/dL (ref 6.0–8.3)

## 2019-03-13 LAB — CBC WITH DIFFERENTIAL/PLATELET
Basophils Absolute: 0 10*3/uL (ref 0.0–0.1)
Basophils Relative: 0.8 % (ref 0.0–3.0)
Eosinophils Absolute: 0.2 10*3/uL (ref 0.0–0.7)
Eosinophils Relative: 3.6 % (ref 0.0–5.0)
HCT: 41.4 % (ref 39.0–52.0)
Hemoglobin: 13.5 g/dL (ref 13.0–17.0)
Lymphocytes Relative: 14.6 % (ref 12.0–46.0)
Lymphs Abs: 0.9 10*3/uL (ref 0.7–4.0)
MCHC: 32.6 g/dL (ref 30.0–36.0)
MCV: 81.7 fl (ref 78.0–100.0)
Monocytes Absolute: 0.7 10*3/uL (ref 0.1–1.0)
Monocytes Relative: 11.4 % (ref 3.0–12.0)
Neutro Abs: 4.3 10*3/uL (ref 1.4–7.7)
Neutrophils Relative %: 69.6 % (ref 43.0–77.0)
Platelets: 294 10*3/uL (ref 150.0–400.0)
RBC: 5.06 Mil/uL (ref 4.22–5.81)
RDW: 14.2 % (ref 11.5–15.5)
WBC: 6.2 10*3/uL (ref 4.0–10.5)

## 2019-03-14 LAB — CEA: CEA: 783.3 ng/mL — ABNORMAL HIGH

## 2019-03-15 ENCOUNTER — Other Ambulatory Visit: Payer: Self-pay

## 2019-03-15 ENCOUNTER — Ambulatory Visit (HOSPITAL_COMMUNITY)
Admission: RE | Admit: 2019-03-15 | Discharge: 2019-03-15 | Disposition: A | Discharge status: Home / Self Care | Payer: Medicare HMO | Source: Ambulatory Visit | Attending: Gastroenterology | Admitting: Gastroenterology

## 2019-03-15 DIAGNOSIS — C787 Secondary malignant neoplasm of liver and intrahepatic bile duct: Secondary | ICD-10-CM | POA: Insufficient documentation

## 2019-03-15 DIAGNOSIS — K6289 Other specified diseases of anus and rectum: Secondary | ICD-10-CM | POA: Insufficient documentation

## 2019-03-15 DIAGNOSIS — I700 Atherosclerosis of aorta: Secondary | ICD-10-CM | POA: Insufficient documentation

## 2019-03-15 DIAGNOSIS — C20 Malignant neoplasm of rectum: Secondary | ICD-10-CM | POA: Diagnosis not present

## 2019-03-15 DIAGNOSIS — R590 Localized enlarged lymph nodes: Secondary | ICD-10-CM | POA: Insufficient documentation

## 2019-03-15 MED ORDER — IOHEXOL 300 MG/ML  SOLN
100.0000 mL | Freq: Once | INTRAMUSCULAR | Status: AC | PRN
  Administered 2019-03-15: 100 mL via INTRAVENOUS

## 2019-03-15 MED ORDER — SODIUM CHLORIDE (PF) 0.9 % IJ SOLN
INTRAMUSCULAR | Status: AC
  Filled 2019-03-15: qty 50

## 2019-03-18 DIAGNOSIS — C20 Malignant neoplasm of rectum: Secondary | ICD-10-CM | POA: Diagnosis not present

## 2019-03-18 DIAGNOSIS — C787 Secondary malignant neoplasm of liver and intrahepatic bile duct: Secondary | ICD-10-CM | POA: Diagnosis not present

## 2019-03-19 ENCOUNTER — Encounter (HOSPITAL_COMMUNITY): Payer: Self-pay | Admitting: Radiology

## 2019-03-19 ENCOUNTER — Inpatient Hospital Stay: Payer: Medicare HMO | Attending: Oncology | Admitting: Oncology

## 2019-03-19 ENCOUNTER — Other Ambulatory Visit: Payer: Self-pay

## 2019-03-19 VITALS — BP 150/79 | HR 88 | Temp 98.2°F | Resp 17 | Ht 74.0 in | Wt 257.2 lb

## 2019-03-19 DIAGNOSIS — C19 Malignant neoplasm of rectosigmoid junction: Secondary | ICD-10-CM | POA: Diagnosis not present

## 2019-03-19 DIAGNOSIS — R970 Elevated carcinoembryonic antigen [CEA]: Secondary | ICD-10-CM | POA: Diagnosis not present

## 2019-03-19 DIAGNOSIS — Z8042 Family history of malignant neoplasm of prostate: Secondary | ICD-10-CM

## 2019-03-19 DIAGNOSIS — I252 Old myocardial infarction: Secondary | ICD-10-CM

## 2019-03-19 DIAGNOSIS — Z923 Personal history of irradiation: Secondary | ICD-10-CM

## 2019-03-19 DIAGNOSIS — Z85810 Personal history of malignant neoplasm of tongue: Secondary | ICD-10-CM

## 2019-03-19 DIAGNOSIS — Z9221 Personal history of antineoplastic chemotherapy: Secondary | ICD-10-CM

## 2019-03-19 DIAGNOSIS — C787 Secondary malignant neoplasm of liver and intrahepatic bile duct: Secondary | ICD-10-CM | POA: Diagnosis not present

## 2019-03-19 DIAGNOSIS — E119 Type 2 diabetes mellitus without complications: Secondary | ICD-10-CM

## 2019-03-19 DIAGNOSIS — Z803 Family history of malignant neoplasm of breast: Secondary | ICD-10-CM | POA: Diagnosis not present

## 2019-03-19 NOTE — Progress Notes (Signed)
Introduced myself as Teacher, English as a foreign language, he was given my direct phone number, along with information on Symptom Management Clinic, Support Groups and Advanced Directives.

## 2019-03-19 NOTE — Progress Notes (Signed)
[name] Male, 69 y.o., 01/03/1951 MRN:  [id number] Phone:  [phone] (H) PCP:  Patient, No Pcp Per Coverage:  [employer] Medicare/[employer] Medicare Hmo  RE: US Liver Biopsy Received: Today Message Contents  Greggory Keen, MD  Jillyn Hidden; P Ir Procedure Requests  Ok for [place] bx liver mets See CT CAP 03/15/19   TS       Previous Messages   ----- Message -----  From: Garth Bigness D  Sent: 03/19/2019  3:11 PM EST  To: Ir Procedure Requests  Subject: US Liver Biopsy                  Procedure: US Liver Biopsy   Reason:  Malignant neoplasm of sigmoid colon, colon cancer   History: CT in computer   Provider:  Ladell Pier   Provider Contact: [phone]

## 2019-03-19 NOTE — Progress Notes (Addendum)
[facility] New Patient Consult   Requesting MD: Doran Stabler, Md [address] [place],  [place] [address]   Clarence Andrews. 69 y.o.  1950-07-14    Reason for Consult: Colorectal cancer   HPI: Clarence Andrews reports rectal urgency beginning in May 2020.  He saw his physician in [facility] and reports a rectal exam and "scan" were negative.  He developed a few weeks of diarrhea and dark stools and was referred to Dr. Loletha Carrow on 02/12/2019.  He was taken to a colonoscopy on 03/07/2019.  Fungating and partially obstructing mass was found in the proximal rectum.  The mass was noted at 16-21 cm from the anal verge.  The area was tattooed and the mass was biopsied.  The mass was passed with the adult endoscope and was advanced to the descending colon.  A 10 mm polyp was noted in the sigmoid colon.  The polyp was not removed.  An upper endoscopy the same today was unremarkable. The pathology from the rectal biopsy returned as adenocarcinoma.  Clarence Andrews underwent CTs of the chest, abdomen, and pelvis on 03/15/2019.  Numerous hepatic metastases were noted.  Wall thickening of the lower sigmoid colon with surrounding interstitial changes.  Moderate lower rectal wall thickening with luminal narrowing.  Small lymph nodes at the sigmoid mesocolon concerning for metastases.  Small right-sided iliac nodes are suspicious.  A 10.5 mm node between the duodenum and pancreas is felt to be a metastatic lesion.  He saw Dr. Dema Severin to discuss surgical options.  He has been referred to consider systemic therapy.  Past Medical History:  Diagnosis Date  .  Coronary artery disease-myocardial infarction  2017  . Cardiac arrhythmia due to congenital heart disease   . CHF (congestive heart failure) ([place])   . Colitis   . Diabetes ([place])   . Paronychia of second toe of right foot   . 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 APPENDECTOMY  1970  . CORONARY ARTERY BYPASS GRAFT   2017   5 vessel   . TOOTH EXTRACTION      .  Total odontectomy prior to treatment of head neck cancer  Medications: Reviewed  Allergies: No Known Allergies  Family history: His mother died of metastatic breast cancer.  2 maternal uncles had prostate cancer.  Social History:   He lives alone in [place].  He works as an Therapist, sports.  He does not use cigarettes.  He reports rare alcohol use.  No risk factor for HIV or hepatitis.  No known transfusion history.  ROS:   Positives include: Arthritis of the third finger bilaterally, occasional pain at the left lower back following an injury as a teenager mild numbness of the fingertips, rectal urgency, diarrhea  A complete ROS was otherwise negative.  Physical Exam:  Blood pressure (!) 150/79, pulse 88, temperature 98.2 F (36.8 C), temperature source Temporal, resp. rate 17, height 6\' 2"  (1.88 m), weight 257 lb 3.2 oz (116.7 kg), SpO2 100 %.  HEENT: Oral cavity without visible mass edentulous, radiation change at the bilateral neck Lungs: End inspiratory rales at the right upper posterior chest, no respiratory distress Cardiac: Regular rate and rhythm Abdomen: Nontender, no mass, no hepatosplenomegaly  Vascular: No leg edema, chronic stasis change at the lower leg bilaterally Lymph nodes: No cervical, supraclavicular, axillary, or inguinal  nodes Neurologic: Alert and oriented, the motor exam appears intact in the upper and lower extremities bilaterally Skin: Mild erythema at the lower pretibial area bilaterally Musculoskeletal: No spine tenderness   LAB:  CBC  Lab Results  Component Value Date   WBC 6.2 03/13/2019   HGB 13.5 03/13/2019   HCT 41.4 03/13/2019   MCV 81.7 03/13/2019   PLT 294.0 03/13/2019   NEUTROABS 4.3 03/13/2019        CMP  Lab Results  Component Value Date    NA 135 03/13/2019   K 3.6 03/13/2019   CL 97 03/13/2019   CO2 31 03/13/2019   GLUCOSE 160 (H) 03/13/2019   BUN 16 03/13/2019   CREATININE 1.24 03/13/2019   CALCIUM 9.8 03/13/2019   PROT 8.1 03/13/2019   ALBUMIN 4.3 03/13/2019   AST 28 03/13/2019   ALT 26 03/13/2019   ALKPHOS 119 (H) 03/13/2019   BILITOT 0.6 03/13/2019   CEA on 03/13/2019: 783   Imaging:  CT images from 03/15/2019-reviewed   Assessment/Plan:   1. Colorectal cancer-adenocarcinoma on biopsy of a high "rectal "mass 03/07/2019  Colonoscopy 03/07/2019-nearly completely obstructing mass beginning at 16 cm from the anal verge, sigmoid colon polyp  CTs 03/15/2019-wall thickening of the lower sigmoid colon, low rectal mass with significant luminal narrowing, diffuse liver metastases, borderline enlarged sigmoid mesocolon, iliac, and upper abdominal lymph nodes  Elevated CEA 2. 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 Diabetes 4. Coronary artery disease, status post coronary artery bypass surgery in 2017   Disposition:   Clarence Andrews has been diagnosed with colorectal cancer.  He appears to have a primary tumor in the high rectum/lower sigmoid colon.  The CT on 03/15/2019 is consistent with metastatic disease involving the right.  I discussed the diagnosis, prognosis, with Clarence Andrews.  He understands no therapy will be curative.  I recommend a diagnostic liver biopsy to confirm the diagnosis of metastatic colorectal cancer and obtain tissue for molecular testing.  I think it is unlikely the liver lesions are related to his history of head neck cancer, but this is possible.  His case will be presented at the GI tumor conference on 03/20/2019.  My initial impression is to recommend systemic chemotherapy with FOLFOX.  He may be a candidate for palliative resection of the primary tumor and hepatic directed therapy in the  future.  Clarence Andrews lives in [place].  He indicated that he would like to have cancer care here.  We discussed the need for a Port-A-Cath and the plan for chemotherapy every 2 weeks.  He will return for an office visit after the liver biopsy.  Betsy Coder, MD  03/19/2019, 2:05 PM

## 2019-03-20 ENCOUNTER — Other Ambulatory Visit: Payer: Self-pay

## 2019-03-20 NOTE — Progress Notes (Signed)
Recommendation obtain liver biopsy to confirm metastasis and [facility].

## 2019-03-28 ENCOUNTER — Other Ambulatory Visit: Payer: Self-pay | Admitting: Radiology

## 2019-03-29 ENCOUNTER — Other Ambulatory Visit: Payer: Self-pay

## 2019-03-29 ENCOUNTER — Ambulatory Visit (HOSPITAL_COMMUNITY)
Admission: RE | Admit: 2019-03-29 | Discharge: 2019-03-29 | Disposition: A | Discharge status: Home / Self Care | Payer: Medicare HMO | Source: Ambulatory Visit | Attending: Oncology | Admitting: Oncology

## 2019-03-29 ENCOUNTER — Encounter (INDEPENDENT_AMBULATORY_CARE_PROVIDER_SITE_OTHER): Payer: Self-pay

## 2019-03-29 ENCOUNTER — Encounter (HOSPITAL_COMMUNITY): Payer: Self-pay

## 2019-03-29 DIAGNOSIS — K769 Liver disease, unspecified: Secondary | ICD-10-CM | POA: Diagnosis not present

## 2019-03-29 DIAGNOSIS — Z79899 Other long term (current) drug therapy: Secondary | ICD-10-CM | POA: Insufficient documentation

## 2019-03-29 DIAGNOSIS — C801 Malignant (primary) neoplasm, unspecified: Secondary | ICD-10-CM | POA: Diagnosis not present

## 2019-03-29 DIAGNOSIS — C787 Secondary malignant neoplasm of liver and intrahepatic bile duct: Secondary | ICD-10-CM | POA: Diagnosis not present

## 2019-03-29 DIAGNOSIS — C187 Malignant neoplasm of sigmoid colon: Secondary | ICD-10-CM | POA: Diagnosis not present

## 2019-03-29 DIAGNOSIS — I509 Heart failure, unspecified: Secondary | ICD-10-CM | POA: Insufficient documentation

## 2019-03-29 DIAGNOSIS — R160 Hepatomegaly, not elsewhere classified: Secondary | ICD-10-CM | POA: Diagnosis not present

## 2019-03-29 DIAGNOSIS — E118 Type 2 diabetes mellitus with unspecified complications: Secondary | ICD-10-CM | POA: Insufficient documentation

## 2019-03-29 DIAGNOSIS — Z7984 Long term (current) use of oral hypoglycemic drugs: Secondary | ICD-10-CM | POA: Insufficient documentation

## 2019-03-29 DIAGNOSIS — Z8589 Personal history of malignant neoplasm of other organs and systems: Secondary | ICD-10-CM | POA: Insufficient documentation

## 2019-03-29 DIAGNOSIS — R6889 Other general symptoms and signs: Secondary | ICD-10-CM | POA: Diagnosis not present

## 2019-03-29 DIAGNOSIS — C20 Malignant neoplasm of rectum: Secondary | ICD-10-CM | POA: Diagnosis not present

## 2019-03-29 LAB — CBC
HCT: 36.5 % — ABNORMAL LOW (ref 39.0–52.0)
Hemoglobin: 11.4 g/dL — ABNORMAL LOW (ref 13.0–17.0)
MCH: 26.7 pg (ref 26.0–34.0)
MCHC: 31.2 g/dL (ref 30.0–36.0)
MCV: 85.5 fL (ref 80.0–100.0)
Platelets: 290 10*3/uL (ref 150–400)
RBC: 4.27 MIL/uL (ref 4.22–5.81)
RDW: 12.8 % (ref 11.5–15.5)
WBC: 6 10*3/uL (ref 4.0–10.5)
nRBC: 0 % (ref 0.0–0.2)

## 2019-03-29 LAB — PROTIME-INR
INR: 1.2 (ref 0.8–1.2)
Prothrombin Time: 14.9 seconds (ref 11.4–15.2)

## 2019-03-29 LAB — GLUCOSE, CAPILLARY: Glucose-Capillary: 258 mg/dL — ABNORMAL HIGH (ref 70–99)

## 2019-03-29 LAB — APTT: aPTT: 32 seconds (ref 24–36)

## 2019-03-29 MED ORDER — MIDAZOLAM HCL 2 MG/2ML IJ SOLN
INTRAMUSCULAR | Status: AC
  Filled 2019-03-29: qty 2

## 2019-03-29 MED ORDER — SODIUM CHLORIDE 0.9 % IV SOLN: INTRAVENOUS | Status: DC

## 2019-03-29 MED ORDER — LIDOCAINE HCL (PF) 1 % IJ SOLN
INTRAMUSCULAR | Status: AC | PRN
  Administered 2019-03-29: 10 mL via INTRADERMAL

## 2019-03-29 MED ORDER — FENTANYL CITRATE (PF) 100 MCG/2ML IJ SOLN
INTRAMUSCULAR | Status: AC | PRN
  Administered 2019-03-29: 50 ug via INTRAVENOUS

## 2019-03-29 MED ORDER — GELATIN ABSORBABLE 12-7 MM EX MISC
CUTANEOUS | Status: AC
  Filled 2019-03-29: qty 1

## 2019-03-29 MED ORDER — MIDAZOLAM HCL 2 MG/2ML IJ SOLN
INTRAMUSCULAR | Status: AC | PRN
  Administered 2019-03-29: 2 mg via INTRAVENOUS

## 2019-03-29 MED ORDER — LIDOCAINE HCL 1 % IJ SOLN
INTRAMUSCULAR | Status: AC
  Filled 2019-03-29: qty 20

## 2019-03-29 MED ORDER — FENTANYL CITRATE (PF) 100 MCG/2ML IJ SOLN
INTRAMUSCULAR | Status: AC
  Filled 2019-03-29: qty 2

## 2019-03-29 NOTE — Procedures (Signed)
Interventional Radiology Procedure Note  Procedure: US guided liver biopsy  Complications: None  Estimated Blood Loss: None  Recommendations: - DC home after bedrest  Signed,  Criselda Peaches, MD

## 2019-03-29 NOTE — Discharge Instructions (Addendum)
For any questions or concerns call [phone]; for after hours call [phone] and ask for on call MD   Liver Biopsy  The liver is a large organ in the upper right side of the abdomen. A liver biopsy is a procedure in which a tissue sample is taken from the liver and examined under a microscope. There are three types of liver biopsies:  Percutaneous. A needle is used to remove a sample through an incision in your abdomen.  Laparoscopic. Several incisions are made in the abdomen. A sample is removed with the help of a tiny camera.  Transjugular. An incision is made in your neck in the area of the jugular vein. A sample is removed through a small flexible tube that is passed down the blood vessel and into your liver. Tell a health care provider about:  Any allergies you have.  All medicines you are taking, including vitamins, herbs, eye drops, creams, and over-the-counter medicines.  Any problems you or family members have had with anesthetic medicines.  Any blood disorders you have.  Any surgeries you have had.  Any medical conditions you have.  Whether you are pregnant or may be pregnant. What are the risks? Generally, this is a safe procedure. However, problems can occur and include:  Bleeding.  Infection.  Bruising.  Pain.  Injury to nearby organs or tissues, such as nerves, gallbladder, liver, or lungs. What happens before the procedure? Eating and drinking restrictions  You may be asked not to drink or eat for 6-8 hours before the liver biopsy. You may be allowed to eat a light breakfast. Talk to your health care provider about when you should stop eating and drinking. Medicines Ask your health care provider about:  Changing or stopping your regular medicines. This is especially important if you are taking diabetes medicines or blood thinners.  Taking medicines such as aspirin and ibuprofen. These medicines can thin your blood. Do not take these medicines  unless your health care provider tells you to take them.  Taking over-the-counter medicines, vitamins, herbs, and supplements. General instructions  Do not use any products that contain nicotine or tobacco, such as cigarettes and e-cigarettes. If you need help quitting, ask your health care provider.  Plan to have someone take you home from the hospital or clinic.  Plan to have a responsible adult care for you for at least 24 hours after you leave the hospital or clinic. This is important.  You may have blood or urine tests.  Ask your health care provider what steps will be taken to prevent infection. These may include: ? Removing hair at the surgery site. ? Washing skin with a germ-killing soap. ? Taking antibiotic medicine. What happens during the procedure?  An IV will be inserted into one of your veins. ? You will be given one or more of the following: ? A medicine to help you relax (sedative). ? A medicine to numb the area (local anesthetic). ? A medicine to make you fall asleep (general anesthetic).  Your health care provider will use one of the following procedures to remove samples from your liver. These procedures may vary among health care providers and hospitals. Percutaneous liver biopsy  You will lie on your back, with your right hand over your head.  A health care provider will locate your liver by tapping and pressing on the right side of your abdomen, or by using an ultrasound or CT scan.  A local anesthetic will be used to numb an  area at the bottom of your last right rib.  A small incision will be made in the numbed area.  A biopsy needle will be inserted into the incision.  Several samples of liver tissue will be taken. You will be asked to hold your breath as each sample is taken.  The incision will be closed with stitches (sutures).  A bandage (dressing) may be placed over the incision. Laparoscopic liver biopsy  You will lie on your back.  Several  small incisions will be made in your abdomen.  Your health care provider will pass a tiny camera through one incision. The camera will allow the liver to be viewed on a TV monitor in the operating room.  Tools will be passed through the other incision or incisions.  Samples of the liver will be removed using the tools.  The incisions will be closed with stitches (sutures).  A bandage (dressing) may be placed over the incisions. Transjugular liver biopsy  You will lie on your back on an X-ray table, with your head turned to your left.  An area on your neck, just over your jugular vein, will be numbed.  An incision will be made in the numbed area.  A tiny tube will be inserted through the incision. The tube will be passed into the jugular vein to a blood vessel in the liver called the hepatic vein.  A dye will be injected through the tube.  X-rays will be taken. The dye will make the blood vessels in the liver light up on the X-rays.  The biopsy needle will be placed through the tube until it reaches the liver.  Samples of liver tissue will be taken with the biopsy needle.  The needle and the tube will be removed.  The incision will be closed with stitches (sutures).  A bandage (dressing) may be placed over the incision. What happens after the procedure?  Your blood pressure, heart rate, breathing rate, and blood oxygen level will be monitored until you leave the hospital or clinic.  You will be asked to rest quietly for 2-4 hours or longer.  You will be closely monitored for bleeding from the biopsy site.  You may be allowed to go home when the medicines have worn off and you can walk, drink, eat, and use the bathroom. Summary  A liver biopsy is a procedure in which a tissue sample is taken from the liver and examined under a microscope.  This is a safe procedure, but problems can occur, including bleeding, infection, pain, or injury to nearby organs or tissues.  Ask  your health care provider about changing or stopping your regular medicines.  Plan to have someone take you home from the hospital or clinic and to be with you for 24 hours after the procedure. This information is not intended to replace advice given to you by your health care provider. Make sure you discuss any questions you have with your health care provider. Document Revised: 01/27/2017 Document Reviewed: 01/27/2017 Elsevier Patient Education  2020 [place]. Liver Biopsy, Care After These instructions give you information on caring for yourself after your procedure. Your doctor may also give you more specific instructions. Call your doctor if you have any problems or questions after your procedure. What can I expect after the procedure? After the procedure, it is common to have:  Pain and soreness where the biopsy was done.  Bruising around the area where the biopsy was done.  Sleepiness and be tired for  a few days. Follow these instructions at home: Medicines  Take over-the-counter and prescription medicines only as told by your doctor.  If you were prescribed an antibiotic medicine, take it as told by your doctor. Do not stop taking the antibiotic even if you start to feel better.  Do not take medicines such as aspirin and ibuprofen. These medicines can thin your blood. Do not take these medicines unless your doctor tells you to take them.  If you are taking prescription pain medicine, take actions to prevent or treat constipation. Your doctor may recommend that you: ? Drink enough fluid to keep your pee (urine) clear or pale yellow. ? Take over-the-counter or prescription medicines. ? Eat foods that are high in fiber, such as fresh fruits and vegetables, whole grains, and beans. ? Limit foods that are high in fat and processed sugars, such as fried and sweet foods. Caring for your cut  Follow instructions from your doctor about how to take care of your cuts from surgery  (incisions). Make sure you: ? Wash your hands with soap and water before you change your bandage (dressing). If you cannot use soap and water, use hand sanitizer. ? Change your bandage as told by your doctor. ? Leave stitches (sutures), skin glue, or skin tape (adhesive) strips in place. They may need to stay in place for 2 weeks or longer. If tape strips get loose and curl up, you may trim the loose edges. Do not remove tape strips completely unless your doctor says it is okay.  Check your cuts every day for signs of infection. Check for: ? Redness, swelling, or more pain. ? Fluid or blood. ? Pus or a bad smell. ? Warmth.  Do not take baths, swim, or use a hot tub until your doctor says it is okay to do so. Activity   Rest at home for 1-2 days or as told by your doctor. ? Avoid sitting for a long time without moving. Get up to take short walks every 1-2 hours.  Return to your normal activities as told by your doctor. Ask what activities are safe for you.  Do not do these things in the first 24 hours: ? Drive. ? Use machinery. ? Take a bath or shower.  Do not lift more than 10 pounds (4.5 kg) or play contact sports for the first 2 weeks. General instructions   Do not drink alcohol in the first week after the procedure.  Have someone stay with you for at least 24 hours after the procedure.  Get your test results. Ask your doctor or the department that is doing the test: ? When will my results be ready? ? How will I get my results? ? What are my treatment options? ? What other tests do I need? ? What are my next steps?  Keep all follow-up visits as told by your doctor. This is important. Contact a doctor if:  A cut bleeds and leaves more than just a small spot of blood.  A cut is red, puffs up (swells), or hurts more than before.  Fluid or something else comes from a cut.  A cut smells bad.  You have a fever or chills. Get help right away if:  You have swelling,  bloating, or pain in your belly (abdomen).  You get dizzy or faint.  You have a rash.  You feel sick to your stomach (nauseous) or throw up (vomit).  You have trouble breathing, feel short of breath, or feel  faint.  Your chest hurts.  You have problems talking or seeing.  You have trouble with your balance or moving your arms or legs. Summary  After the procedure, it is common to have pain, soreness, bruising, and tiredness.  Your doctor will tell you how to take care of yourself at home. Change your bandage, take your medicines, and limit your activities as told by your doctor.  Call your doctor if you have symptoms of infection. Get help right away if your belly swells, your cut bleeds a lot, or you have trouble talking or breathing. This information is not intended to replace advice given to you by your health care provider. Make sure you discuss any questions you have with your health care provider. Document Revised: 01/27/2017 Document Reviewed: 01/27/2017 Elsevier Patient Education  [place]. Moderate Conscious Sedation, Adult, Care After These instructions provide you with information about caring for yourself after your procedure. Your health care provider may also give you more specific instructions. Your treatment has been planned according to current medical practices, but problems sometimes occur. Call your health care provider if you have any problems or questions after your procedure. What can I expect after the procedure? After your procedure, it is common:  To feel sleepy for several hours.  To feel clumsy and have poor balance for several hours.  To have poor judgment for several hours.  To vomit if you eat too soon. Follow these instructions at home: For at least 24 hours after the procedure:   Do not: ? Participate in activities where you could fall or become injured. ? Drive. ? Use heavy machinery. ? Drink alcohol. ? Take sleeping pills or  medicines that cause drowsiness. ? Make important decisions or sign legal documents. ? Take care of children on your own.  Rest. Eating and drinking  Follow the diet recommended by your health care provider.  If you vomit: ? Drink water, juice, or soup when you can drink without vomiting. ? Make sure you have little or no nausea before eating solid foods. General instructions  Have a responsible adult stay with you until you are awake and alert.  Take over-the-counter and prescription medicines only as told by your health care provider.  If you smoke, do not smoke without supervision.  Keep all follow-up visits as told by your health care provider. This is important. Contact a health care provider if:  You keep feeling nauseous or you keep vomiting.  You feel light-headed.  You develop a rash.  You have a fever. Get help right away if:  You have trouble breathing. This information is not intended to replace advice given to you by your health care provider. Make sure you discuss any questions you have with your health care provider. Document Revised: 12/30/2016 Document Reviewed: 05/09/2015 Elsevier Patient Education  2020 [employer].

## 2019-03-29 NOTE — H&P (Signed)
Chief Complaint: Patient was seen in consultation today for liver biopsy at the request of Clarence Andrews  Referring Physician(s): Ladell Pier  Supervising Physician: Jacqulynn Cadet  Patient Status: [facility] - Out-pt  History of Present Illness: Clarence Andrews. is a 69 y.o. male found to have multiple liver lesions presumed to be mets from a primary colon mass. He is referred for liver biopsy. PMHx, meds, labs, imaging, allergies reviewed. Feels well, no recent fevers, chills, illness. Has been NPO today as directed.   Past Medical History:  Diagnosis Date  . Cancer ([place])   . Cardiac arrhythmia due to congenital heart disease   . CHF (congestive heart failure) ([place])   . Colitis   . Diabetes ([place])   . Paronychia of second toe of right foot   . Throat cancer [facility])     Past Surgical History:  Procedure Laterality Date  . APPENDECTOMY  1970  . CORONARY ARTERY BYPASS GRAFT     5 vessel   . TOOTH EXTRACTION      Allergies: Patient has no known allergies.  Medications:  Current Outpatient Medications:  .  Cholecalciferol (D3-1000 PO), Take by mouth., Disp: , Rfl:  .  Chromium-Cinnamon [id number] MCG-MG CAPS, Take 2,000 mg by mouth daily., Disp: , Rfl:  .  ferrous sulfate 325 (65 FE) MG EC tablet, Take 325 mg by mouth daily with breakfast., Disp: , Rfl:  .  glipiZIDE (GLUCOTROL) 10 MG tablet, Take 10 mg by mouth daily before breakfast., Disp: , Rfl:  .  magnesium oxide (MAG-OX) 400 MG tablet, Take 400 mg by mouth daily. Takes 2 tabs daily, Disp: , Rfl:  .  Multiple Vitamin (MULTIVITAMIN) tablet, Take 1 tablet by mouth daily., Disp: , Rfl:  .  omeprazole (PRILOSEC) 40 MG capsule, Take 40 mg by mouth daily., Disp: , Rfl:  .  polyethylene glycol powder (GLYCOLAX/MIRALAX) 17 GM/SCOOP powder, Take 17 g by mouth 2 (two) times daily. With 8 ounces of water, Disp: , Rfl:  .  Turmeric 500 MG CAPS, Take 2,000 mg by mouth daily. Taking 2 - 1000 mg tabs daily, Disp: , Rfl:    .  UNABLE TO FIND, Med Name: Fungus Clear one daily, Disp: , Rfl:  .  UNABLE TO FIND, Med Name: Mag Blue 3 tabs daily, Disp: , Rfl:  .  vitamin Andrews-12 (CYANOCOBALAMIN) 500 MCG tablet, Take 500 mcg by mouth daily., Disp: , Rfl:   Current Facility-Administered Medications:  .  0.9 %  sodium chloride infusion, , Intravenous, Continuous, Luciel Brickman, PA-C    Family History  Problem Relation Age of Onset  . Breast cancer Mother   . Colon polyps Father   . Heart disease Father   . Colon cancer Neg Hx   . Esophageal cancer Neg Hx   . Rectal cancer Neg Hx   . Stomach cancer Neg Hx     Social History   Socioeconomic History  . Marital status: Single    Spouse name: Not on file  . Number of children: Not on file  . Years of education: Not on file  . Highest education level: Not on file  Occupational History  . Not on file  Tobacco Use  . Smoking status: Never Smoker  . Smokeless tobacco: Never Used  Substance and Sexual Activity  . Alcohol use: Not Currently  . Drug use: Never  . Sexual activity: Not on file  Other Topics Concern  . Not on file  Social History Narrative  .  Not on file   Social Determinants of Health   Financial Resource Strain:   . Difficulty of Paying Living Expenses: Not on file  Food Insecurity:   . Worried About Charity fundraiser in the Last Year: Not on file  . Ran Out of Food in the Last Year: Not on file  Transportation Needs:   . Lack of Transportation (Medical): Not on file  . Lack of Transportation (Non-Medical): Not on file  Physical Activity:   . Days of Exercise per Week: Not on file  . Minutes of Exercise per Session: Not on file  Stress:   . Feeling of Stress : Not on file  Social Connections:   . Frequency of Communication with Friends and Family: Not on file  . Frequency of Social Gatherings with Friends and Family: Not on file  . Attends Religious Services: Not on file  . Active Member of Clubs or Organizations: Not on file  .  Attends Archivist Meetings: Not on file  . Marital Status: Not on file     Review of Systems: A 12 point ROS discussed and pertinent positives are indicated in the HPI above.  All other systems are negative.  Review of Systems  Vital Signs: T:98.7, HR: 95, BP: 146/77  Physical Exam Constitutional:      Appearance: Normal appearance.  HENT:     Mouth/Throat:     Mouth: Mucous membranes are moist.     Pharynx: Oropharynx is clear.  Cardiovascular:     Rate and Rhythm: Normal rate and regular rhythm.     Heart sounds: Normal heart sounds.  Pulmonary:     Effort: Pulmonary effort is normal. No respiratory distress.     Breath sounds: Normal breath sounds.  Abdominal:     General: Abdomen is flat. There is no distension.     Palpations: Abdomen is soft.  Skin:    General: Skin is warm and dry.  Neurological:     General: No focal deficit present.     Mental Status: He is alert and oriented to person, place, and time.  Psychiatric:        Mood and Affect: Mood normal.        Thought Content: Thought content normal.        Judgment: Judgment normal.      Imaging: CT CHEST W CONTRAST  Result Date: 03/15/2019 CLINICAL DATA:  Malignant appearing rectal mass found on sigmoidoscopy 03/07/2019 EXAM: CT CHEST, ABDOMEN, AND PELVIS WITH CONTRAST TECHNIQUE: Multidetector CT imaging of the chest, abdomen and pelvis was performed following the standard protocol during bolus administration of intravenous contrast. CONTRAST:  162mL OMNIPAQUE IOHEXOL 300 MG/ML  SOLN COMPARISON:  None. FINDINGS: CT CHEST FINDINGS Cardiovascular: The heart is normal in size. No pericardial effusion. Mild fusiform enlargement of the ascending aorta with maximum measurement of 4.2 cm. No dissection. No aortic calcifications. There are three-vessel coronary artery calcifications and evidence of prior coronary artery bypass surgery. The pulmonary arteries appear normal. Mediastinum/Nodes: No mediastinal or  hilar mass or adenopathy. Small scattered lymph nodes are noted. Calcified left hilar nodes are noted. Left atrial appendage closure device is noted. The esophagus is grossly normal. There is a small hiatal hernia. Lungs/Pleura: No worrisome pulmonary nodules to suggest pulmonary metastatic disease. Patchy E airspace opacity in the right middle lobe is likely an area of inflammation or atelectasis. No pleural effusions. Musculoskeletal: No chest wall mass, supraclavicular or axillary adenopathy. The thyroid gland appears normal. The  bony thorax is intact. Moderate osteoporosis and degenerative changes involving the thoracic spine. No worrisome bone lesions. CT ABDOMEN PELVIS FINDINGS Hepatobiliary: Unfortunately, there are numerous hepatic metastatic lesions. 4.7 cm lesion in segment 4A. 3.8 cm lesion in segment 6/7 (image 55/3) 4.0 cm lesion in segment 6 (image 68/3). 2 cm lesion in the caudate lobe (image 53/3). 2 cm lesion in segment 3 (image 53/3). Other smaller lesions are identified in both lobes. The gallbladder is unremarkable. No intra or extrahepatic biliary dilatation. Pancreas: No mass, inflammation or ductal dilatation. Prominent fatty change involving the pancreatic head in particular. Spleen: Normal size. No focal lesions. Adrenals/Urinary Tract: The adrenal glands are unremarkable. Bilateral renal cysts but no worrisome renal lesions or hydronephrosis. The bladder is unremarkable. Stomach/Bowel: The stomach, duodenum and small bowel are unremarkable. No acute inflammatory changes, mass lesions or obstructive findings. The terminal ileum is normal. Moderate lower rectal wall thickening with significant luminal narrowing. Mild asymmetric wall thickening along the right side of the rectum slightly more proximally. Apparent wall thickening of the lower sigmoid colon with some surrounding interstitial changes. Could not exclude tumor but recommend correlation with recent colonoscopy. The remainder of the  colon is unremarkable. Do not see any other lesions. There is a moderate amount of stool throughout the colon Vascular/Lymphatic: The aorta and branch vessels are patent. The major venous structures are patent. Small lymph nodes are noted along the sigmoid mesocolon worrisome for metastatic adenopathy. There are also small right-sided iliac chain nodes which are suspicious. Small scattered retroperitoneal lymph nodes are indeterminate. 8 mm gastrohepatic ligament node is suspicious. 10.5 mm lymph node between the duodenum and the pancreas on image 66/3 is likely metastatic. Reproductive: The prostate gland is mildly enlarged. The seminal vesicles appear normal. Other: No inguinal adenopathy. Small left inguinal hernia containing fat. Musculoskeletal: No significant bony findings. IMPRESSION: 1. Low rectal mass with significant luminal narrowing. 2. Apparent wall thickening of the lower sigmoid colon with some surrounding interstitial changes. Could not exclude tumor but recommend correlation with recent colonoscopy. 3. Diffuse hepatic metastatic disease. 4. Borderline enlarged sigmoid mesocolon, iliac chain and upper abdominal lymph nodes. 5. No findings for pulmonary metastatic disease. 6. Mild fusiform enlargement of the ascending aorta with maximum measurement of 4.2 cm. Recommend annual imaging followup by CTA or MRA. This recommendation follows 2010 ACCF/AHA/AATS/ACR/ASA/SCA/SCAI/SIR/STS/SVM Guidelines for the Diagnosis and Management of Patients with Thoracic Aortic Disease. Circulation. 2010; [id number]. Aortic aneurysm NOS (ICD10-I71.9) Aortic Atherosclerosis (ICD10-I70.0). Electronically Signed   By: Marijo Sanes M.D.   On: 03/15/2019 15:29   CT ABDOMEN PELVIS W CONTRAST  Result Date: 03/15/2019 CLINICAL DATA:  Malignant appearing rectal mass found on sigmoidoscopy 03/07/2019 EXAM: CT CHEST, ABDOMEN, AND PELVIS WITH CONTRAST TECHNIQUE: Multidetector CT imaging of the chest, abdomen and pelvis was  performed following the standard protocol during bolus administration of intravenous contrast. CONTRAST:  111mL OMNIPAQUE IOHEXOL 300 MG/ML  SOLN COMPARISON:  None. FINDINGS: CT CHEST FINDINGS Cardiovascular: The heart is normal in size. No pericardial effusion. Mild fusiform enlargement of the ascending aorta with maximum measurement of 4.2 cm. No dissection. No aortic calcifications. There are three-vessel coronary artery calcifications and evidence of prior coronary artery bypass surgery. The pulmonary arteries appear normal. Mediastinum/Nodes: No mediastinal or hilar mass or adenopathy. Small scattered lymph nodes are noted. Calcified left hilar nodes are noted. Left atrial appendage closure device is noted. The esophagus is grossly normal. There is a small hiatal hernia. Lungs/Pleura: No worrisome pulmonary nodules to suggest pulmonary  metastatic disease. Patchy E airspace opacity in the right middle lobe is likely an area of inflammation or atelectasis. No pleural effusions. Musculoskeletal: No chest wall mass, supraclavicular or axillary adenopathy. The thyroid gland appears normal. The bony thorax is intact. Moderate osteoporosis and degenerative changes involving the thoracic spine. No worrisome bone lesions. CT ABDOMEN PELVIS FINDINGS Hepatobiliary: Unfortunately, there are numerous hepatic metastatic lesions. 4.7 cm lesion in segment 4A. 3.8 cm lesion in segment 6/7 (image 55/3) 4.0 cm lesion in segment 6 (image 68/3). 2 cm lesion in the caudate lobe (image 53/3). 2 cm lesion in segment 3 (image 53/3). Other smaller lesions are identified in both lobes. The gallbladder is unremarkable. No intra or extrahepatic biliary dilatation. Pancreas: No mass, inflammation or ductal dilatation. Prominent fatty change involving the pancreatic head in particular. Spleen: Normal size. No focal lesions. Adrenals/Urinary Tract: The adrenal glands are unremarkable. Bilateral renal cysts but no worrisome renal lesions or  hydronephrosis. The bladder is unremarkable. Stomach/Bowel: The stomach, duodenum and small bowel are unremarkable. No acute inflammatory changes, mass lesions or obstructive findings. The terminal ileum is normal. Moderate lower rectal wall thickening with significant luminal narrowing. Mild asymmetric wall thickening along the right side of the rectum slightly more proximally. Apparent wall thickening of the lower sigmoid colon with some surrounding interstitial changes. Could not exclude tumor but recommend correlation with recent colonoscopy. The remainder of the colon is unremarkable. Do not see any other lesions. There is a moderate amount of stool throughout the colon Vascular/Lymphatic: The aorta and branch vessels are patent. The major venous structures are patent. Small lymph nodes are noted along the sigmoid mesocolon worrisome for metastatic adenopathy. There are also small right-sided iliac chain nodes which are suspicious. Small scattered retroperitoneal lymph nodes are indeterminate. 8 mm gastrohepatic ligament node is suspicious. 10.5 mm lymph node between the duodenum and the pancreas on image 66/3 is likely metastatic. Reproductive: The prostate gland is mildly enlarged. The seminal vesicles appear normal. Other: No inguinal adenopathy. Small left inguinal hernia containing fat. Musculoskeletal: No significant bony findings. IMPRESSION: 1. Low rectal mass with significant luminal narrowing. 2. Apparent wall thickening of the lower sigmoid colon with some surrounding interstitial changes. Could not exclude tumor but recommend correlation with recent colonoscopy. 3. Diffuse hepatic metastatic disease. 4. Borderline enlarged sigmoid mesocolon, iliac chain and upper abdominal lymph nodes. 5. No findings for pulmonary metastatic disease. 6. Mild fusiform enlargement of the ascending aorta with maximum measurement of 4.2 cm. Recommend annual imaging followup by CTA or MRA. This recommendation follows  2010 ACCF/AHA/AATS/ACR/ASA/SCA/SCAI/SIR/STS/SVM Guidelines for the Diagnosis and Management of Patients with Thoracic Aortic Disease. Circulation. 2010; [id number]. Aortic aneurysm NOS (ICD10-I71.9) Aortic Atherosclerosis (ICD10-I70.0). Electronically Signed   By: Marijo Sanes M.D.   On: 03/15/2019 15:29    Labs:  CBC: Recent Labs    02/12/19 1134 03/13/19 1445 03/29/19 1140  WBC 5.0 6.2 6.0  HGB 13.1 13.5 11.4*  HCT 40.5 41.4 36.5*  PLT 214.0 294.0 290    COAGS: No results for input(s): INR, APTT in the last 8760 hours.  BMP: Recent Labs    02/12/19 1134 03/13/19 1445  NA 137 135  K 4.1 3.6  CL 100 97  CO2 30 31  GLUCOSE 257* 160*  BUN 21 16  CALCIUM 9.6 9.8  CREATININE 1.18 1.24    LIVER FUNCTION TESTS: Recent Labs    02/12/19 1134 03/13/19 1445  BILITOT 0.8 0.6  AST 27 28  ALT 27 26  ALKPHOS  122* 119*  PROT 7.5 8.1  ALBUMIN 4.2 4.3    TUMOR MARKERS: Recent Labs    03/13/19 1445  CEA 783.3*    Assessment and Plan: Liver lesions in setting of colon mass For US guided liver lesion biopsy Labs pending Risks and benefits of liver biopsy was discussed with the patient and/or patient's family including, but not limited to bleeding, infection, damage to adjacent structures or low yield requiring additional tests.  All of the questions were answered and there is agreement to proceed.  Consent signed and in chart.    Thank you for this interesting consult.  I greatly enjoyed meeting Yoseph Buchanan. and look forward to participating in their care.  A copy of this report was sent to the requesting provider on this date.  Electronically Signed: Ascencion Dike, PA-C 03/29/2019, 12:06 PM   I spent a total of 20 minutes in face to face in clinical consultation, greater than 50% of which was counseling/coordinating care for liver biopsy

## 2019-04-02 ENCOUNTER — Encounter: Payer: Self-pay | Admitting: Nurse Practitioner

## 2019-04-02 ENCOUNTER — Telehealth: Payer: Self-pay | Admitting: *Deleted

## 2019-04-02 ENCOUNTER — Other Ambulatory Visit: Payer: Self-pay

## 2019-04-02 ENCOUNTER — Other Ambulatory Visit: Payer: Self-pay | Admitting: Radiology

## 2019-04-02 ENCOUNTER — Encounter: Payer: Self-pay | Admitting: *Deleted

## 2019-04-02 ENCOUNTER — Telehealth: Payer: Self-pay | Admitting: Oncology

## 2019-04-02 ENCOUNTER — Inpatient Hospital Stay: Payer: Medicare HMO | Attending: Oncology | Admitting: Nurse Practitioner

## 2019-04-02 VITALS — BP 143/91 | HR 84 | Temp 98.5°F | Resp 18 | Ht 74.0 in | Wt 249.1 lb

## 2019-04-02 DIAGNOSIS — D701 Agranulocytosis secondary to cancer chemotherapy: Secondary | ICD-10-CM | POA: Insufficient documentation

## 2019-04-02 DIAGNOSIS — R197 Diarrhea, unspecified: Secondary | ICD-10-CM | POA: Insufficient documentation

## 2019-04-02 DIAGNOSIS — C186 Malignant neoplasm of descending colon: Secondary | ICD-10-CM | POA: Diagnosis not present

## 2019-04-02 DIAGNOSIS — C19 Malignant neoplasm of rectosigmoid junction: Secondary | ICD-10-CM | POA: Diagnosis not present

## 2019-04-02 DIAGNOSIS — R531 Weakness: Secondary | ICD-10-CM | POA: Insufficient documentation

## 2019-04-02 DIAGNOSIS — C187 Malignant neoplasm of sigmoid colon: Secondary | ICD-10-CM

## 2019-04-02 DIAGNOSIS — Z5111 Encounter for antineoplastic chemotherapy: Secondary | ICD-10-CM | POA: Diagnosis not present

## 2019-04-02 DIAGNOSIS — C787 Secondary malignant neoplasm of liver and intrahepatic bile duct: Secondary | ICD-10-CM | POA: Diagnosis not present

## 2019-04-02 DIAGNOSIS — Z923 Personal history of irradiation: Secondary | ICD-10-CM | POA: Insufficient documentation

## 2019-04-02 DIAGNOSIS — E119 Type 2 diabetes mellitus without complications: Secondary | ICD-10-CM | POA: Insufficient documentation

## 2019-04-02 DIAGNOSIS — Z85810 Personal history of malignant neoplasm of tongue: Secondary | ICD-10-CM | POA: Insufficient documentation

## 2019-04-02 DIAGNOSIS — Z7189 Other specified counseling: Secondary | ICD-10-CM | POA: Insufficient documentation

## 2019-04-02 DIAGNOSIS — T451X5A Adverse effect of antineoplastic and immunosuppressive drugs, initial encounter: Secondary | ICD-10-CM | POA: Diagnosis not present

## 2019-04-02 DIAGNOSIS — I2510 Atherosclerotic heart disease of native coronary artery without angina pectoris: Secondary | ICD-10-CM | POA: Diagnosis not present

## 2019-04-02 MED ORDER — LIDOCAINE-PRILOCAINE 2.5-2.5 % EX CREA: TOPICAL_CREAM | CUTANEOUS | 2 refills | Status: DC

## 2019-04-02 MED ORDER — PROCHLORPERAZINE MALEATE 10 MG PO TABS: 10.0000 mg | ORAL_TABLET | Freq: Four times a day (QID) | ORAL | 2 refills | Status: DC | PRN

## 2019-04-02 NOTE — Progress Notes (Signed)
Left voice message for patient regarding appointment for port placement this Thursday 3/4 at 12:00 to arrive at [facility] by 11:30, NPO past 7 am and he must have a driver.  I asked him to return my call to confirm he received this message.

## 2019-04-02 NOTE — Telephone Encounter (Signed)
Per Ned Card, NP, contacted pathology for MSI testing. Foundation one testing not able to be done until restaging is resulted

## 2019-04-02 NOTE — Progress Notes (Signed)
[place] Work  Clinical Social Work received referral from medical oncology for financial concerns.  CSW met with patient in [place] office at [facility] to offer support and assess for needs.  Patient stated he was  Currently receiving social security and unemployment.  Patient stated we has not received his unemployment this month because "they say I owe them $300".  Patient stated he has attempted to contact unemployment multiple times, but has not been able to connect with anyone or have his questions answered.  Patient stated once he receives his social security for this month he can pay the $300.  CSW encouraged patient to continue calling and to check his online account.  Patient expressed financial concerns this month due to not receiving his unemployment and multiple doctors appointments.  CSW provided patient with a $25 gas card to assist with transportation needs.  CSW encouraged patient to contact CSW if he has any additional questions or concerns.  Johnnye Lana, MSW, LCSW, OSW-C Clinical Social Worker [facility] [phone]

## 2019-04-02 NOTE — Progress Notes (Addendum)
[facility] OFFICE PROGRESS NOTE   Diagnosis: Colorectal cancer  INTERVAL HISTORY:   Clarence Andrews returns as scheduled.  He underwent biopsy of a liver lesion on 03/29/2019.  Pathology showed metastatic adenocarcinoma.  He reports bowels moving regularly.  He denies constipation.  He does note stools are smaller than typical.  He continues MiraLAX.  He denies abdominal pain.  No rectal bleeding.  No nausea or vomiting.  He describes his appetite as "okay".  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  Objective:  Vital signs in last 24 hours:  Blood pressure (!) 143/91, pulse 84, temperature 98.5 F (36.9 C), temperature source Oral, resp. rate 18, height 6\' 2"  (1.88 m), weight 249 lb 1.6 oz (113 kg), SpO2 100 %.    GI: Abdomen soft and nontender.  No hepatomegaly. Vascular: No leg edema.  Chronic stasis change bilaterally. Neuro: Alert and oriented.   Lab Results:  Lab Results  Component Value Date   WBC 6.0 03/29/2019   HGB 11.4 (L) 03/29/2019   HCT 36.5 (L) 03/29/2019   MCV 85.5 03/29/2019   PLT 290 03/29/2019   NEUTROABS 4.3 03/13/2019    Imaging:  No results found.  Medications: I have reviewed the patient's current medications.  Assessment/Plan: 1. 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 Colonoscopy 03/07/2019-nearly completely obstructing mass beginning at 16 cm from the anal verge, sigmoid colon polyp ? CTs 03/15/2019-wall thickening of the lower sigmoid colon, low rectal mass with significant luminal narrowing, diffuse liver metastases, borderline enlarged sigmoid mesocolon, iliac, and upper abdominal lymph nodes ? Elevated CEA ? Biopsy liver lesion 03/29/2019-metastatic adenocarcinoma to liver consistent with clinical impression of colorectal primary. 2. 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 Diabetes 4. Coronary artery disease, status post coronary artery bypass surgery in 2017   Disposition: Clarence Andrews appears stable.  He underwent biopsy of a liver lesion on 03/29/2019.  Pathology confirms metastatic adenocarcinoma consistent with colorectal primary.  We reviewed the biopsy result with him at today's visit.  He understands that no therapy will be curative.  Dr. Gearldine Shown recommendation is to proceed with FOLFOX chemotherapy.  We reviewed potential toxicities associated with chemotherapy including bone marrow toxicity, nausea, hair loss, allergic reaction.  We discussed potential toxicities associated 5-fluorouracil including mouth sores, diarrhea, hand-foot syndrome, skin rash, skin hyperpigmentation, increased sensitivity to sun.  We reviewed potential toxicities associated with oxaliplatin including cold sensitivity, peripheral neuropathy, acute laryngopharyngeal dysesthesia and more rare occurrences such as diplopia, ataxia, incontinence and jaw pain.  He agrees to proceed.  He will attend a chemotherapy education class.  He understands a Port-A-Cath is required for this regimen.  We made a referral to Dr. Dema Severin.  Prescriptions were sent to his pharmacy for Compazine and EMLA cream.  We will request foundation 1 testing on the liver biopsy.  He will return for cycle 1 FOLFOX on 04/11/2019.  We will see him in follow-up prior to cycle 2 on 04/25/2019.  He will contact the office in the interim with any problems.  Patient seen with Dr. [name].    Ned Card ANP/GNP-BC   04/02/2019  10:36 AM This was a shared visit with Ned Card.  The liver biopsy confirmed metastatic colon cancer.  We discussed the prognosis and treatment options with Clarence Andrews.  He understands no therapy will be curative.  I recommend FOLFOX chemotherapy.  The liver  biopsy will be submitted for Foundation 1 testing.  We reviewed potential toxicities associated with the FOLFOX regimen including  the chance of neuropathy.  He agrees to proceed.  We will asked Dr. Dema Severin to place a Port-A-Cath.  The plan is to begin FOLFOX on 04/11/2019.  Julieanne Manson, MD

## 2019-04-02 NOTE — Telephone Encounter (Signed)
Scheduled per los. Gave patient avs and calendar

## 2019-04-02 NOTE — Progress Notes (Signed)
START ON PATHWAY REGIMEN - Colorectal     A cycle is every 14 days:     Oxaliplatin      Leucovorin      Fluorouracil      Fluorouracil   **Always confirm dose/schedule in your pharmacy ordering system**  Patient Characteristics: Distant Metastases, Nonsurgical Candidate, KRAS/NRAS Mutation Positive/Unknown (BRAF V600 Wild-Type/Unknown), Standard Cytotoxic Therapy, First Line Standard Cytotoxic Therapy, Bevacizumab Ineligible, PS = 0,1 Tumor Location: Colon Therapeutic Status: Distant Metastases Microsatellite/Mismatch Repair Status: Unknown BRAF Mutation Status: Awaiting Test Results KRAS/NRAS Mutation Status: Awaiting Test Results Standard Cytotoxic Line of Therapy: First Line Standard Cytotoxic Therapy ECOG Performance Status: 0 Bevacizumab Eligibility: Ineligible Intent of Therapy: Non-Curative / Palliative Intent, Discussed with Patient

## 2019-04-03 ENCOUNTER — Encounter: Payer: Self-pay | Admitting: *Deleted

## 2019-04-03 NOTE — Progress Notes (Signed)
Email request to [facility]ology for Foundation One Testing on case #[id number] dated 03/29/19 at request of Dr. [name]. 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

## 2019-04-04 ENCOUNTER — Other Ambulatory Visit: Payer: Self-pay | Admitting: Oncology

## 2019-04-04 ENCOUNTER — Ambulatory Visit (HOSPITAL_COMMUNITY)
Admission: RE | Admit: 2019-04-04 | Discharge: 2019-04-04 | Disposition: A | Discharge status: Home / Self Care | Payer: Medicare HMO | Source: Ambulatory Visit | Attending: Nurse Practitioner | Admitting: Nurse Practitioner

## 2019-04-04 ENCOUNTER — Other Ambulatory Visit: Payer: Self-pay

## 2019-04-04 ENCOUNTER — Encounter (HOSPITAL_COMMUNITY): Payer: Self-pay

## 2019-04-04 ENCOUNTER — Ambulatory Visit (HOSPITAL_COMMUNITY)
Admission: RE | Admit: 2019-04-04 | Discharge: 2019-04-04 | Disposition: A | Discharge status: Home / Self Care | Payer: Medicare HMO | Source: Ambulatory Visit | Attending: Oncology | Admitting: Oncology

## 2019-04-04 DIAGNOSIS — I509 Heart failure, unspecified: Secondary | ICD-10-CM | POA: Diagnosis not present

## 2019-04-04 DIAGNOSIS — C189 Malignant neoplasm of colon, unspecified: Secondary | ICD-10-CM | POA: Diagnosis not present

## 2019-04-04 DIAGNOSIS — C787 Secondary malignant neoplasm of liver and intrahepatic bile duct: Secondary | ICD-10-CM | POA: Diagnosis not present

## 2019-04-04 DIAGNOSIS — R6889 Other general symptoms and signs: Secondary | ICD-10-CM | POA: Diagnosis not present

## 2019-04-04 DIAGNOSIS — Z5111 Encounter for antineoplastic chemotherapy: Secondary | ICD-10-CM | POA: Diagnosis not present

## 2019-04-04 DIAGNOSIS — E119 Type 2 diabetes mellitus without complications: Secondary | ICD-10-CM | POA: Insufficient documentation

## 2019-04-04 DIAGNOSIS — C187 Malignant neoplasm of sigmoid colon: Secondary | ICD-10-CM | POA: Insufficient documentation

## 2019-04-04 DIAGNOSIS — Z7984 Long term (current) use of oral hypoglycemic drugs: Secondary | ICD-10-CM | POA: Insufficient documentation

## 2019-04-04 DIAGNOSIS — Z79899 Other long term (current) drug therapy: Secondary | ICD-10-CM | POA: Insufficient documentation

## 2019-04-04 HISTORY — PX: IR IMAGING GUIDED PORT INSERTION: IMG5740

## 2019-04-04 LAB — CBC WITH DIFFERENTIAL/PLATELET
Abs Immature Granulocytes: 0.01 10*3/uL (ref 0.00–0.07)
Basophils Absolute: 0 10*3/uL (ref 0.0–0.1)
Basophils Relative: 1 %
Eosinophils Absolute: 0.1 10*3/uL (ref 0.0–0.5)
Eosinophils Relative: 2 %
HCT: 41.3 % (ref 39.0–52.0)
Hemoglobin: 12.8 g/dL — ABNORMAL LOW (ref 13.0–17.0)
Immature Granulocytes: 0 %
Lymphocytes Relative: 12 %
Lymphs Abs: 0.5 10*3/uL — ABNORMAL LOW (ref 0.7–4.0)
MCH: 26.6 pg (ref 26.0–34.0)
MCHC: 31 g/dL (ref 30.0–36.0)
MCV: 85.9 fL (ref 80.0–100.0)
Monocytes Absolute: 0.6 10*3/uL (ref 0.1–1.0)
Monocytes Relative: 14 %
Neutro Abs: 3 10*3/uL (ref 1.7–7.7)
Neutrophils Relative %: 71 %
Platelets: 235 10*3/uL (ref 150–400)
RBC: 4.81 MIL/uL (ref 4.22–5.81)
RDW: 12.8 % (ref 11.5–15.5)
WBC: 4.3 10*3/uL (ref 4.0–10.5)
nRBC: 0 % (ref 0.0–0.2)

## 2019-04-04 LAB — GLUCOSE, CAPILLARY: Glucose-Capillary: 126 mg/dL — ABNORMAL HIGH (ref 70–99)

## 2019-04-04 MED ORDER — LIDOCAINE HCL 1 % IJ SOLN
INTRAMUSCULAR | Status: AC
  Administered 2019-04-04: 10 mL via INTRADERMAL
  Filled 2019-04-04: qty 20

## 2019-04-04 MED ORDER — LIDOCAINE HCL 1 % IJ SOLN: 10.0000 mL | Freq: Once | INTRAMUSCULAR | Status: AC

## 2019-04-04 MED ORDER — FENTANYL CITRATE (PF) 100 MCG/2ML IJ SOLN
INTRAMUSCULAR | Status: AC | PRN
  Administered 2019-04-04: 50 ug via INTRAVENOUS

## 2019-04-04 MED ORDER — HEPARIN SOD (PORK) LOCK FLUSH 100 UNIT/ML IV SOLN
500.0000 [IU] | Freq: Once | INTRAVENOUS | Status: AC
  Administered 2019-04-04: 500 [IU] via INTRAVENOUS

## 2019-04-04 MED ORDER — FENTANYL CITRATE (PF) 100 MCG/2ML IJ SOLN
INTRAMUSCULAR | Status: AC
  Filled 2019-04-04: qty 2

## 2019-04-04 MED ORDER — CEFAZOLIN SODIUM-DEXTROSE 2-4 GM/100ML-% IV SOLN
2.0000 g | INTRAVENOUS | Status: AC
  Administered 2019-04-04: 2 g via INTRAVENOUS
  Filled 2019-04-04: qty 100

## 2019-04-04 MED ORDER — SODIUM CHLORIDE 0.9 % IV SOLN: INTRAVENOUS | Status: DC

## 2019-04-04 MED ORDER — HEPARIN SOD (PORK) LOCK FLUSH 100 UNIT/ML IV SOLN
INTRAVENOUS | Status: AC
  Filled 2019-04-04: qty 5

## 2019-04-04 MED ORDER — LIDOCAINE-EPINEPHRINE (PF) 2 %-1:200000 IJ SOLN
10.0000 mL | Freq: Once | INTRAMUSCULAR | Status: AC
  Administered 2019-04-04: 10 mL

## 2019-04-04 MED ORDER — LIDOCAINE-EPINEPHRINE 1 %-1:100000 IJ SOLN
INTRAMUSCULAR | Status: AC
  Filled 2019-04-04: qty 1

## 2019-04-04 NOTE — Discharge Instructions (Signed)
Urgent Needs on call IR MD  [phone]  Wound May remove dressing in 24 to 48 hours and shower.  Keep site clean and dry and replace bandaid as needed Do not submerge in tub until site has healed. Do not use EMLA cream until site is healed.  (may use ice until site healed)  After therapy completed, your Provider should have appointments set up for monthly port flushes.  Implanted Port Insertion, Care After This sheet gives you information about how to care for yourself after your procedure. Your health care provider may also give you more specific instructions. If you have problems or questions, contact your health care provider. What can I expect after the procedure? After the procedure, it is common to have:  Discomfort at the port insertion site.  Bruising on the skin over the port. This should improve over 3-4 days. Follow these instructions at home: [facility] care  After your port is placed, you will get a manufacturer's information card. The card has information about your port. Keep this card with you at all times.  Take care of the port as told by your health care provider. Ask your health care provider if you or a family member can get training for taking care of the port at home. A home health care nurse may also take care of the port.  Make sure to remember what type of port you have. Incision care   Follow instructions from your health care provider about how to take care of your port insertion site. Make sure you: ? Wash your hands with soap and water before and after you change your bandage (dressing). If soap and water are not available, use hand sanitizer. ? Change your dressing as told by your health care provider. ? Leave stitches (sutures), skin glue, or adhesive strips in place. These skin closures may need to stay in place for 2 weeks or longer. If adhesive strip edges start to loosen and curl up, you may trim the loose edges. Do not remove adhesive strips completely  unless your health care provider tells you to do that.  Check your port insertion site every day for signs of infection. Check for: ? Redness, swelling, or pain. ? Fluid or blood. ? Warmth. ? Pus or a bad smell. Activity  Return to your normal activities as told by your health care provider. Ask your health care provider what activities are safe for you.  Do not lift anything that is heavier than 10 lb (4.5 kg), or the limit that you are told, until your health care provider says that it is safe. General instructions  Take over-the-counter and prescription medicines only as told by your health care provider.  Do not take baths, swim, or use a hot tub until your health care provider approves. Ask your health care provider if you may take showers. You may only be allowed to take sponge baths.  Do not drive for 24 hours if you were given a sedative during your procedure.  Wear a medical alert bracelet in case of an emergency. This will tell any health care providers that you have a port.  Keep all follow-up visits as told by your health care provider. This is important. Contact a health care provider if:  You cannot flush your port with saline as directed, or you cannot draw blood from the port.  You have a fever or chills.  You have redness, swelling, or pain around your port insertion site.  You have fluid  or blood coming from your port insertion site.  Your port insertion site feels warm to the touch.  You have pus or a bad smell coming from the port insertion site. Get help right away if:  You have chest pain or shortness of breath.  You have bleeding from your port that you cannot control. Summary  Take care of the port as told by your health care provider. Keep the manufacturer's information card with you at all times.  Change your dressing as told by your health care provider.  Contact a health care provider if you have a fever or chills or if you have redness,  swelling, or pain around your port insertion site.  Keep all follow-up visits as told by your health care provider. This information is not intended to replace advice given to you by your health care provider. Make sure you discuss any questions you have with your health care provider. Document Revised: 08/15/2017 Document Reviewed: 08/15/2017 Elsevier Patient Education  [place].

## 2019-04-04 NOTE — Procedures (Signed)
Interventional Radiology Procedure:   Indications: Colon cancer  Procedure: Port placement  Findings: Right jugular port, tip at SVC/RA junction  Complications: None     EBL: Minimal, less than 10 ml  Plan: Discharge in one hour.  Keep port site and incisions dry for at least 24 hours.     Zadiel Leyh R. Junko Ohagan, MD  Pager: [phone]    

## 2019-04-04 NOTE — H&P (Signed)
Chief Complaint: Patient was seen in consultation today for colorectal cancer/Port-a-cath placement.  Referring Physician(s): Owens Shark  Supervising Physician: Markus Daft  Patient Status: [facility] - Out-pt  History of Present Illness: Clarence Sabins. is a 69 y.o. 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 He is known to IR- he underwent an image-guided liver lesion biopsy 03/29/2019 by Dr. Laurence Ferrari, results revealing metastatic adenocarcinoma, presumed colorectal cancer as primary. His cancer is managed by Ned Card, NP. He has tentative plans to begin systemic chemotherapy for management.  IR requested by Ned Card, NP for possible image-guided Port-a-cath placement. Patient awake and alert laying in bed with no complaints at this time. Denies fever, chills, chest pain, dyspnea, abdominal pain, or headache.   Past Medical History:  Diagnosis Date  . Cancer ([place])   . Cardiac arrhythmia due to congenital heart disease   . CHF (congestive heart failure) ([place])   . Colitis   . Diabetes ([place])   . Paronychia of second toe of right foot   . Throat cancer [facility])     Past Surgical History:  Procedure Laterality Date  . APPENDECTOMY  1970  . CORONARY ARTERY BYPASS GRAFT     5 vessel   . TOOTH EXTRACTION      Allergies: Patient has no known allergies.  Medications: Prior to Admission medications   Medication Sig Start Date End Date Taking? Authorizing Provider  Cholecalciferol (D3-1000 PO) Take by mouth.    [provider]  Chromium-Cinnamon [phone] MCG-MG CAPS Take 2,000 mg by mouth daily.    [provider]  ferrous sulfate 325 (65 FE) MG EC tablet Take 325 mg by mouth daily with breakfast.    [provider]  glipiZIDE (GLUCOTROL) 10 MG tablet Take 10 mg by mouth daily before breakfast.    [provider]    lidocaine-prilocaine (EMLA) cream Apply to port site 1-2 hours prior to use 04/02/19   Owens Shark, NP  magnesium oxide (MAG-OX) 400 MG tablet Take 400 mg by mouth daily. Takes 2 tabs daily    [provider]  Multiple Vitamin (MULTIVITAMIN) tablet Take 1 tablet by mouth daily.    [provider]  omeprazole (PRILOSEC) 40 MG capsule Take 40 mg by mouth daily.    [provider]  polyethylene glycol powder (GLYCOLAX/MIRALAX) 17 GM/SCOOP powder Take 17 g by mouth 2 (two) times daily. With 8 ounces of water    [provider]  prochlorperazine (COMPAZINE) 10 MG tablet Take 1 tablet (10 mg total) by mouth every 6 (six) hours as needed for nausea or vomiting. 04/02/19   Owens Shark, NP  Turmeric 500 MG CAPS Take 2,000 mg by mouth daily. Taking 2 - 1000 mg tabs daily    [provider]  UNABLE TO FIND Med Name: Fungus Clear one daily    [provider]  UNABLE TO FIND Med Name: Mag Blue 3 tabs daily    [provider]  vitamin B-12 (CYANOCOBALAMIN) 500 MCG tablet Take 500 mcg by mouth daily.    [provider]     Family History  Problem Relation Age of Onset  . Breast cancer Mother   . Colon polyps Father   . Heart disease Father   . Colon cancer Neg Hx   . Esophageal cancer Neg Hx   . Rectal cancer Neg Hx   . Stomach  cancer Neg Hx     Social History   Socioeconomic History  . Marital status: Single    Spouse name: Not on file  . Number of children: Not on file  . Years of education: Not on file  . Highest education level: Not on file  Occupational History  . Not on file  Tobacco Use  . Smoking status: Never Smoker  . Smokeless tobacco: Never Used  Substance and Sexual Activity  . Alcohol use: Not Currently  . Drug use: Never  . Sexual activity: Not on file  Other Topics Concern  . Not on file  Social History Narrative  . Not on file   Social Determinants of Health   Financial Resource Strain:   .  Difficulty of Paying Living Expenses: Not on file  Food Insecurity:   . Worried About Charity fundraiser in the Last Year: Not on file  . Ran Out of Food in the Last Year: Not on file  Transportation Needs:   . Lack of Transportation (Medical): Not on file  . Lack of Transportation (Non-Medical): Not on file  Physical Activity:   . Days of Exercise per Week: Not on file  . Minutes of Exercise per Session: Not on file  Stress:   . Feeling of Stress : Not on file  Social Connections:   . Frequency of Communication with Friends and Family: Not on file  . Frequency of Social Gatherings with Friends and Family: Not on file  . Attends Religious Services: Not on file  . Active Member of Clubs or Organizations: Not on file  . Attends Archivist Meetings: Not on file  . Marital Status: Not on file     Review of Systems: A 12 point ROS discussed and pertinent positives are indicated in the HPI above.  All other systems are negative.  Review of Systems  Constitutional: Negative for chills and fever.  Respiratory: Negative for shortness of breath and wheezing.   Cardiovascular: Negative for chest pain and palpitations.  Gastrointestinal: Negative for abdominal pain.  Neurological: Negative for headaches.  Psychiatric/Behavioral: Negative for behavioral problems and confusion.    Vital Signs: There were no vitals taken for this visit.  Physical Exam Vitals and nursing note reviewed.  Constitutional:      General: He is not in acute distress.    Appearance: Normal appearance.  Cardiovascular:     Rate and Rhythm: Normal rate and regular rhythm.     Heart sounds: Normal heart sounds. No murmur.  Pulmonary:     Effort: Pulmonary effort is normal. No respiratory distress.     Breath sounds: Normal breath sounds. No wheezing.  Skin:    General: Skin is warm and dry.  Neurological:     Mental Status: He is alert and oriented to person, place, and time.  Psychiatric:         Mood and Affect: Mood normal.        Behavior: Behavior normal.      MD Evaluation Airway: WNL Heart: WNL Abdomen: WNL Chest/ Lungs: WNL ASA  Classification: 3 Mallampati/Airway Score: One   Imaging: CT CHEST W CONTRAST  Result Date: 03/15/2019 CLINICAL DATA:  Malignant appearing rectal mass found on sigmoidoscopy 03/07/2019 EXAM: CT CHEST, ABDOMEN, AND PELVIS WITH CONTRAST TECHNIQUE: Multidetector CT imaging of the chest, abdomen and pelvis was performed following the standard protocol during bolus administration of intravenous contrast. CONTRAST:  197mL OMNIPAQUE IOHEXOL 300 MG/ML  SOLN COMPARISON:  None. FINDINGS:  CT CHEST FINDINGS Cardiovascular: The heart is normal in size. No pericardial effusion. Mild fusiform enlargement of the ascending aorta with maximum measurement of 4.2 cm. No dissection. No aortic calcifications. There are three-vessel coronary artery calcifications and evidence of prior coronary artery bypass surgery. The pulmonary arteries appear normal. Mediastinum/Nodes: No mediastinal or hilar mass or adenopathy. Small scattered lymph nodes are noted. Calcified left hilar nodes are noted. Left atrial appendage closure device is noted. The esophagus is grossly normal. There is a small hiatal hernia. Lungs/Pleura: No worrisome pulmonary nodules to suggest pulmonary metastatic disease. Patchy E airspace opacity in the right middle lobe is likely an area of inflammation or atelectasis. No pleural effusions. Musculoskeletal: No chest wall mass, supraclavicular or axillary adenopathy. The thyroid gland appears normal. The bony thorax is intact. Moderate osteoporosis and degenerative changes involving the thoracic spine. No worrisome bone lesions. CT ABDOMEN PELVIS FINDINGS Hepatobiliary: Unfortunately, there are numerous hepatic metastatic lesions. 4.7 cm lesion in segment 4A. 3.8 cm lesion in segment 6/7 (image 55/3) 4.0 cm lesion in segment 6 (image 68/3). 2 cm lesion in the caudate  lobe (image 53/3). 2 cm lesion in segment 3 (image 53/3). Other smaller lesions are identified in both lobes. The gallbladder is unremarkable. No intra or extrahepatic biliary dilatation. Pancreas: No mass, inflammation or ductal dilatation. Prominent fatty change involving the pancreatic head in particular. Spleen: Normal size. No focal lesions. Adrenals/Urinary Tract: The adrenal glands are unremarkable. Bilateral renal cysts but no worrisome renal lesions or hydronephrosis. The bladder is unremarkable. Stomach/Bowel: The stomach, duodenum and small bowel are unremarkable. No acute inflammatory changes, mass lesions or obstructive findings. The terminal ileum is normal. Moderate lower rectal wall thickening with significant luminal narrowing. Mild asymmetric wall thickening along the right side of the rectum slightly more proximally. Apparent wall thickening of the lower sigmoid colon with some surrounding interstitial changes. Could not exclude tumor but recommend correlation with recent colonoscopy. The remainder of the colon is unremarkable. Do not see any other lesions. There is a moderate amount of stool throughout the colon Vascular/Lymphatic: The aorta and branch vessels are patent. The major venous structures are patent. Small lymph nodes are noted along the sigmoid mesocolon worrisome for metastatic adenopathy. There are also small right-sided iliac chain nodes which are suspicious. Small scattered retroperitoneal lymph nodes are indeterminate. 8 mm gastrohepatic ligament node is suspicious. 10.5 mm lymph node between the duodenum and the pancreas on image 66/3 is likely metastatic. Reproductive: The prostate gland is mildly enlarged. The seminal vesicles appear normal. Other: No inguinal adenopathy. Small left inguinal hernia containing fat. Musculoskeletal: No significant bony findings. IMPRESSION: 1. Low rectal mass with significant luminal narrowing. 2. Apparent wall thickening of the lower sigmoid  colon with some surrounding interstitial changes. Could not exclude tumor but recommend correlation with recent colonoscopy. 3. Diffuse hepatic metastatic disease. 4. Borderline enlarged sigmoid mesocolon, iliac chain and upper abdominal lymph nodes. 5. No findings for pulmonary metastatic disease. 6. Mild fusiform enlargement of the ascending aorta with maximum measurement of 4.2 cm. Recommend annual imaging followup by CTA or MRA. This recommendation follows 2010 ACCF/AHA/AATS/ACR/ASA/SCA/SCAI/SIR/STS/SVM Guidelines for the Diagnosis and Management of Patients with Thoracic Aortic Disease. Circulation. 2010; [id number]. Aortic aneurysm NOS (ICD10-I71.9) Aortic Atherosclerosis (ICD10-I70.0). Electronically Signed   By: Marijo Sanes M.D.   On: 03/15/2019 15:29   CT ABDOMEN PELVIS W CONTRAST  Result Date: 03/15/2019 CLINICAL DATA:  Malignant appearing rectal mass found on sigmoidoscopy 03/07/2019 EXAM: CT CHEST, ABDOMEN, AND PELVIS  WITH CONTRAST TECHNIQUE: Multidetector CT imaging of the chest, abdomen and pelvis was performed following the standard protocol during bolus administration of intravenous contrast. CONTRAST:  15mL OMNIPAQUE IOHEXOL 300 MG/ML  SOLN COMPARISON:  None. FINDINGS: CT CHEST FINDINGS Cardiovascular: The heart is normal in size. No pericardial effusion. Mild fusiform enlargement of the ascending aorta with maximum measurement of 4.2 cm. No dissection. No aortic calcifications. There are three-vessel coronary artery calcifications and evidence of prior coronary artery bypass surgery. The pulmonary arteries appear normal. Mediastinum/Nodes: No mediastinal or hilar mass or adenopathy. Small scattered lymph nodes are noted. Calcified left hilar nodes are noted. Left atrial appendage closure device is noted. The esophagus is grossly normal. There is a small hiatal hernia. Lungs/Pleura: No worrisome pulmonary nodules to suggest pulmonary metastatic disease. Patchy E airspace opacity in the  right middle lobe is likely an area of inflammation or atelectasis. No pleural effusions. Musculoskeletal: No chest wall mass, supraclavicular or axillary adenopathy. The thyroid gland appears normal. The bony thorax is intact. Moderate osteoporosis and degenerative changes involving the thoracic spine. No worrisome bone lesions. CT ABDOMEN PELVIS FINDINGS Hepatobiliary: Unfortunately, there are numerous hepatic metastatic lesions. 4.7 cm lesion in segment 4A. 3.8 cm lesion in segment 6/7 (image 55/3) 4.0 cm lesion in segment 6 (image 68/3). 2 cm lesion in the caudate lobe (image 53/3). 2 cm lesion in segment 3 (image 53/3). Other smaller lesions are identified in both lobes. The gallbladder is unremarkable. No intra or extrahepatic biliary dilatation. Pancreas: No mass, inflammation or ductal dilatation. Prominent fatty change involving the pancreatic head in particular. Spleen: Normal size. No focal lesions. Adrenals/Urinary Tract: The adrenal glands are unremarkable. Bilateral renal cysts but no worrisome renal lesions or hydronephrosis. The bladder is unremarkable. Stomach/Bowel: The stomach, duodenum and small bowel are unremarkable. No acute inflammatory changes, mass lesions or obstructive findings. The terminal ileum is normal. Moderate lower rectal wall thickening with significant luminal narrowing. Mild asymmetric wall thickening along the right side of the rectum slightly more proximally. Apparent wall thickening of the lower sigmoid colon with some surrounding interstitial changes. Could not exclude tumor but recommend correlation with recent colonoscopy. The remainder of the colon is unremarkable. Do not see any other lesions. There is a moderate amount of stool throughout the colon Vascular/Lymphatic: The aorta and branch vessels are patent. The major venous structures are patent. Small lymph nodes are noted along the sigmoid mesocolon worrisome for metastatic adenopathy. There are also small  right-sided iliac chain nodes which are suspicious. Small scattered retroperitoneal lymph nodes are indeterminate. 8 mm gastrohepatic ligament node is suspicious. 10.5 mm lymph node between the duodenum and the pancreas on image 66/3 is likely metastatic. Reproductive: The prostate gland is mildly enlarged. The seminal vesicles appear normal. Other: No inguinal adenopathy. Small left inguinal hernia containing fat. Musculoskeletal: No significant bony findings. IMPRESSION: 1. Low rectal mass with significant luminal narrowing. 2. Apparent wall thickening of the lower sigmoid colon with some surrounding interstitial changes. Could not exclude tumor but recommend correlation with recent colonoscopy. 3. Diffuse hepatic metastatic disease. 4. Borderline enlarged sigmoid mesocolon, iliac chain and upper abdominal lymph nodes. 5. No findings for pulmonary metastatic disease. 6. Mild fusiform enlargement of the ascending aorta with maximum measurement of 4.2 cm. Recommend annual imaging followup by CTA or MRA. This recommendation follows 2010 ACCF/AHA/AATS/ACR/ASA/SCA/SCAI/SIR/STS/SVM Guidelines for the Diagnosis and Management of Patients with Thoracic Aortic Disease. Circulation. 2010; [id number]. Aortic aneurysm NOS (ICD10-I71.9) Aortic Atherosclerosis (ICD10-I70.0). Electronically Signed   By:  Marijo Sanes M.D.   On: 03/15/2019 15:29   US BIOPSY (LIVER)  Result Date: 03/29/2019 INDICATION: 69 year old male with a malignant rectal mass and multifocal hepatic lesions concerning for metastatic disease. He presents for ultrasound-guided core biopsy to confirm tissue diagnosis. EXAM: ULTRASOUND BIOPSY CORE LIVER MEDICATIONS: None. ANESTHESIA/SEDATION: Moderate (conscious) sedation was employed during this procedure. A total of Versed 2 mg and Fentanyl 50 mcg was administered intravenously. Moderate Sedation Time: 10 minutes. The patient's level of consciousness and vital signs were monitored continuously by  radiology nursing throughout the procedure under my direct supervision. FLUOROSCOPY TIME:  None COMPLICATIONS: None immediate. PROCEDURE: Informed written consent was obtained from the patient after a thorough discussion of the procedural risks, benefits and alternatives. All questions were addressed. Maximal Sterile Barrier Technique was utilized including caps, mask, sterile gowns, sterile gloves, sterile drape, hand hygiene and skin antiseptic. A timeout was performed prior to the initiation of the procedure. The liver was interrogated with ultrasound. The lesion in hepatic segment 6 was successfully identified. A suitable skin entry site was selected and marked. The overlying skin was sterilely prepped and draped in the standard fashion with chlorhexidine skin prep. Local anesthesia was attained by infiltration with 1% lidocaine. A small dermatotomy was made. Under real-time ultrasound guidance, a 17 gauge introducer needle was advanced through the liver and positioned at the margin of the mass. Multiple 18 gauge core biopsies were then coaxially obtained using the bio Pince automated biopsy device. Biopsy specimens were placed in formalin and delivered to pathology for further analysis. As the introducer needle was removed, the biopsy tract was embolized with a Gel-Foam slurry. Post biopsy ultrasound imaging demonstrates no evidence of immediate complication. IMPRESSION: Ultrasound-guided core biopsy of liver lesion. Electronically Signed   By: Jacqulynn Cadet M.D.   On: 03/29/2019 15:45    Labs:  CBC: Recent Labs    02/12/19 1134 03/13/19 1445 03/29/19 1140  WBC 5.0 6.2 6.0  HGB 13.1 13.5 11.4*  HCT 40.5 41.4 36.5*  PLT 214.0 294.0 290    COAGS: Recent Labs    03/29/19 1140  INR 1.2  APTT 32    BMP: Recent Labs    02/12/19 1134 03/13/19 1445  NA 137 135  K 4.1 3.6  CL 100 97  CO2 30 31  GLUCOSE 257* 160*  BUN 21 16  CALCIUM 9.6 9.8  CREATININE 1.18 1.24    LIVER  FUNCTION TESTS: Recent Labs    02/12/19 1134 03/13/19 1445  BILITOT 0.8 0.6  AST 27 28  ALT 27 26  ALKPHOS 122* 119*  PROT 7.5 8.1  ALBUMIN 4.2 4.3    TUMOR MARKERS: Recent Labs    03/13/19 1445  CEA 783.3*    Assessment and Plan:  Colorectal cancer with tentative plans to begin systemic chemotherapy for management. Plan for image-guided Port-a-cath placement today in IR. Patient is NPO. Afebrile. He does not take blood thinners.  Risks and benefits of image-guided Port-a-catheter placement were discussed with the patient including, but not limited to bleeding, infection, pneumothorax, or fibrin sheath development and need for additional procedures. All of the patient's questions were answered, patient is agreeable to proceed. Consent signed and in chart.   Thank you for this interesting consult.  I greatly enjoyed meeting Clarence Andrews. and look forward to participating in their care.  A copy of this report was sent to the requesting provider on this date.  Electronically Signed: Earley Abide, PA-C 04/04/2019, 12:27 PM  I spent a total of 25 Minutes in face to face in clinical consultation, greater than 50% of which was counseling/coordinating care for colorectal cancer/Port-a-cath placement.

## 2019-04-04 NOTE — Sedation Documentation (Signed)
Pt unsedated due to transportation issues

## 2019-04-05 LAB — SURGICAL PATHOLOGY

## 2019-04-07 ENCOUNTER — Other Ambulatory Visit: Payer: Self-pay | Admitting: Oncology

## 2019-04-08 ENCOUNTER — Inpatient Hospital Stay: Payer: Medicare HMO

## 2019-04-11 ENCOUNTER — Inpatient Hospital Stay: Payer: Medicare HMO

## 2019-04-11 ENCOUNTER — Other Ambulatory Visit: Payer: Self-pay

## 2019-04-11 ENCOUNTER — Telehealth: Payer: Self-pay

## 2019-04-11 VITALS — BP 139/74 | HR 72 | Temp 98.6°F | Resp 19 | Ht 74.0 in | Wt 246.0 lb

## 2019-04-11 DIAGNOSIS — C187 Malignant neoplasm of sigmoid colon: Secondary | ICD-10-CM | POA: Diagnosis not present

## 2019-04-11 DIAGNOSIS — R531 Weakness: Secondary | ICD-10-CM | POA: Diagnosis not present

## 2019-04-11 DIAGNOSIS — R6889 Other general symptoms and signs: Secondary | ICD-10-CM | POA: Diagnosis not present

## 2019-04-11 DIAGNOSIS — Z95828 Presence of other vascular implants and grafts: Secondary | ICD-10-CM

## 2019-04-11 DIAGNOSIS — R197 Diarrhea, unspecified: Secondary | ICD-10-CM | POA: Diagnosis not present

## 2019-04-11 DIAGNOSIS — Z5111 Encounter for antineoplastic chemotherapy: Secondary | ICD-10-CM | POA: Diagnosis not present

## 2019-04-11 DIAGNOSIS — C186 Malignant neoplasm of descending colon: Secondary | ICD-10-CM

## 2019-04-11 DIAGNOSIS — T451X5A Adverse effect of antineoplastic and immunosuppressive drugs, initial encounter: Secondary | ICD-10-CM | POA: Diagnosis not present

## 2019-04-11 DIAGNOSIS — E119 Type 2 diabetes mellitus without complications: Secondary | ICD-10-CM | POA: Diagnosis not present

## 2019-04-11 DIAGNOSIS — D701 Agranulocytosis secondary to cancer chemotherapy: Secondary | ICD-10-CM | POA: Diagnosis not present

## 2019-04-11 DIAGNOSIS — C787 Secondary malignant neoplasm of liver and intrahepatic bile duct: Secondary | ICD-10-CM | POA: Diagnosis not present

## 2019-04-11 DIAGNOSIS — C19 Malignant neoplasm of rectosigmoid junction: Secondary | ICD-10-CM | POA: Diagnosis not present

## 2019-04-11 DIAGNOSIS — Z85810 Personal history of malignant neoplasm of tongue: Secondary | ICD-10-CM | POA: Diagnosis not present

## 2019-04-11 LAB — CBC WITH DIFFERENTIAL (CANCER CENTER ONLY)
Abs Immature Granulocytes: 0.02 10*3/uL (ref 0.00–0.07)
Basophils Absolute: 0 10*3/uL (ref 0.0–0.1)
Basophils Relative: 0 %
Eosinophils Absolute: 0.1 10*3/uL (ref 0.0–0.5)
Eosinophils Relative: 2 %
HCT: 33.7 % — ABNORMAL LOW (ref 39.0–52.0)
Hemoglobin: 10.7 g/dL — ABNORMAL LOW (ref 13.0–17.0)
Immature Granulocytes: 1 %
Lymphocytes Relative: 15 %
Lymphs Abs: 0.6 10*3/uL — ABNORMAL LOW (ref 0.7–4.0)
MCH: 26.4 pg (ref 26.0–34.0)
MCHC: 31.8 g/dL (ref 30.0–36.0)
MCV: 83 fL (ref 80.0–100.0)
Monocytes Absolute: 0.5 10*3/uL (ref 0.1–1.0)
Monocytes Relative: 11 %
Neutro Abs: 2.9 10*3/uL (ref 1.7–7.7)
Neutrophils Relative %: 71 %
Platelet Count: 156 10*3/uL (ref 150–400)
RBC: 4.06 MIL/uL — ABNORMAL LOW (ref 4.22–5.81)
RDW: 12.8 % (ref 11.5–15.5)
WBC Count: 4.1 10*3/uL (ref 4.0–10.5)
nRBC: 0 % (ref 0.0–0.2)

## 2019-04-11 LAB — CMP (CANCER CENTER ONLY)
ALT: 14 U/L (ref 0–44)
AST: 23 U/L (ref 15–41)
Albumin: 3 g/dL — ABNORMAL LOW (ref 3.5–5.0)
Alkaline Phosphatase: 111 U/L (ref 38–126)
Anion gap: 6 (ref 5–15)
BUN: 21 mg/dL (ref 8–23)
CO2: 28 mmol/L (ref 22–32)
Calcium: 8 mg/dL — ABNORMAL LOW (ref 8.9–10.3)
Chloride: 100 mmol/L (ref 98–111)
Creatinine: 1.31 mg/dL — ABNORMAL HIGH (ref 0.61–1.24)
GFR, Est AFR Am: >60 mL/min (ref >60)
GFR, Estimated: 55 mL/min — ABNORMAL LOW (ref >60)
Glucose, Bld: 255 mg/dL — ABNORMAL HIGH (ref 70–99)
Potassium: 4.1 mmol/L (ref 3.5–5.1)
Sodium: 134 mmol/L — ABNORMAL LOW (ref 135–145)
Total Bilirubin: 0.4 mg/dL (ref 0.3–1.2)
Total Protein: 6.3 g/dL — ABNORMAL LOW (ref 6.5–8.1)

## 2019-04-11 LAB — CEA (IN HOUSE-CHCC): CEA (CHCC-In House): 4.74 ng/mL (ref 0.00–5.00)

## 2019-04-11 LAB — GLUCOSE, CAPILLARY: Glucose-Capillary: 304 mg/dL — ABNORMAL HIGH (ref 70–99)

## 2019-04-11 MED ORDER — SODIUM CHLORIDE 0.9 % IV SOLN: 10.0000 mg | Freq: Once | INTRAVENOUS | Status: DC

## 2019-04-11 MED ORDER — SODIUM CHLORIDE 0.9% FLUSH
10.0000 mL | INTRAVENOUS | Status: DC | PRN
  Administered 2019-04-11: 10 mL via INTRAVENOUS
  Filled 2019-04-11: qty 10

## 2019-04-11 MED ORDER — PALONOSETRON HCL INJECTION 0.25 MG/5ML
0.2500 mg | Freq: Once | INTRAVENOUS | Status: AC
  Administered 2019-04-11: 0.25 mg via INTRAVENOUS

## 2019-04-11 MED ORDER — INSULIN REGULAR HUMAN 100 UNIT/ML IJ SOLN
INTRAMUSCULAR | Status: AC
  Filled 2019-04-11: qty 1

## 2019-04-11 MED ORDER — SODIUM CHLORIDE 0.9 % IV SOLN
2400.0000 mg/m2 | INTRAVENOUS | Status: DC
  Administered 2019-04-11: 5850 mg via INTRAVENOUS
  Filled 2019-04-11: qty 117

## 2019-04-11 MED ORDER — DEXAMETHASONE SODIUM PHOSPHATE 10 MG/ML IJ SOLN
INTRAMUSCULAR | Status: AC
  Filled 2019-04-11: qty 1

## 2019-04-11 MED ORDER — OXALIPLATIN CHEMO INJECTION 100 MG/20ML
83.0000 mg/m2 | Freq: Once | INTRAVENOUS | Status: AC
  Administered 2019-04-11: 200 mg via INTRAVENOUS
  Filled 2019-04-11: qty 40

## 2019-04-11 MED ORDER — HEPARIN SOD (PORK) LOCK FLUSH 100 UNIT/ML IV SOLN
500.0000 [IU] | Freq: Once | INTRAVENOUS | Status: DC
  Filled 2019-04-11: qty 5

## 2019-04-11 MED ORDER — INSULIN REGULAR HUMAN 100 UNIT/ML IJ SOLN
10.0000 [IU] | Freq: Once | INTRAMUSCULAR | Status: AC
  Administered 2019-04-11: 10 [IU] via SUBCUTANEOUS

## 2019-04-11 MED ORDER — LEUCOVORIN CALCIUM INJECTION 350 MG
400.0000 mg/m2 | Freq: Once | INTRAVENOUS | Status: AC
  Administered 2019-04-11: 972 mg via INTRAVENOUS
  Filled 2019-04-11: qty 48.6

## 2019-04-11 MED ORDER — PALONOSETRON HCL INJECTION 0.25 MG/5ML
INTRAVENOUS | Status: AC
  Filled 2019-04-11: qty 5

## 2019-04-11 MED ORDER — FLUOROURACIL CHEMO INJECTION 2.5 GM/50ML
400.0000 mg/m2 | Freq: Once | INTRAVENOUS | Status: AC
  Administered 2019-04-11: 950 mg via INTRAVENOUS
  Filled 2019-04-11: qty 19

## 2019-04-11 MED ORDER — DEXTROSE 5 % IV SOLN
Freq: Once | INTRAVENOUS | Status: AC
  Filled 2019-04-11: qty 250

## 2019-04-11 MED ORDER — DEXAMETHASONE SODIUM PHOSPHATE 10 MG/ML IJ SOLN
10.0000 mg | Freq: Once | INTRAMUSCULAR | Status: AC
  Administered 2019-04-11: 10 mg via INTRAVENOUS

## 2019-04-11 NOTE — Progress Notes (Signed)
Saw patient in infusion area for first treatment of Folfox.  He seems to be doing well.  He brought in living will and I explained that we unfortunately are unable to notarize and witness it here because we no longer have the resources.  I suggested he get it notarized at his bank and bring it back in next time for [place] to scan to his chart.  He verbalized an understanding.

## 2019-04-11 NOTE — Patient Instructions (Signed)
**Please follow up with your PCP about your blood sugars and avoid simple sugars/sweets**  COVID-19 Vaccine Information can be found at: [email or web address] For questions related to vaccine distribution or appointments, please email [email or web address] or call [phone].   [facility] Discharge Instructions for Patients Receiving Chemotherapy  Today you received the following chemotherapy agents: Oxaliplatin (Eloxatin), Leucovorin, Fluorouracil (Adrucil, 5-FU)  To help prevent nausea and vomiting after your treatment, we encourage you to take your nausea medication as directed by your provider.    If you develop nausea and vomiting that is not controlled by your nausea medication, call the clinic.   BELOW ARE SYMPTOMS THAT SHOULD BE REPORTED IMMEDIATELY:  *FEVER GREATER THAN 100.5 F  *CHILLS WITH OR WITHOUT FEVER  NAUSEA AND VOMITING THAT IS NOT CONTROLLED WITH YOUR NAUSEA MEDICATION  *UNUSUAL SHORTNESS OF BREATH  *UNUSUAL BRUISING OR BLEEDING  TENDERNESS IN MOUTH AND THROAT WITH OR WITHOUT PRESENCE OF ULCERS  *URINARY PROBLEMS  *BOWEL PROBLEMS  UNUSUAL RASH Items with * indicate a potential emergency and should be followed up as soon as possible.  Feel free to call the clinic should you have any questions or concerns. The clinic phone number is [phone].  Please show the [employer] at check-in to the Emergency Department and triage nurse.  Oxaliplatin Injection What is this medicine? OXALIPLATIN (ox AL i PLA tin) is a chemotherapy drug. It targets fast dividing cells, like cancer cells, and causes these cells to die. This medicine is used to treat cancers of the colon and rectum, and many other cancers. This medicine may be used for other purposes; ask your health care provider or pharmacist if you have questions. COMMON BRAND NAME(S): Eloxatin What should I tell my health care  provider before I take this medicine? They need to know if you have any of these conditions:  heart disease  history of irregular heartbeat  liver disease  low blood counts, like white cells, platelets, or red blood cells  lung or breathing disease, like asthma  take medicines that treat or prevent blood clots  tingling of the fingers or toes, or other nerve disorder  an unusual or allergic reaction to oxaliplatin, other chemotherapy, other medicines, foods, dyes, or preservatives  pregnant or trying to get pregnant  breast-feeding How should I use this medicine? This drug is given as an infusion into a vein. It is administered in a hospital or clinic by a specially trained health care professional. Talk to your pediatrician regarding the use of this medicine in children. Special care may be needed. Overdosage: If you think you have taken too much of this medicine contact a poison control center or emergency room at once. NOTE: This medicine is only for you. Do not share this medicine with others. What if I miss a dose? It is important not to miss a dose. Call your doctor or health care professional if you are unable to keep an appointment. What may interact with this medicine? Do not take this medicine with any of the following medications:  cisapride  dronedarone  pimozide  thioridazine This medicine may also interact with the following medications:  aspirin and aspirin-like medicines  certain medicines that treat or prevent blood clots like warfarin, apixaban, dabigatran, and rivaroxaban  cisplatin  cyclosporine  diuretics  medicines for infection like acyclovir, adefovir, amphotericin B, bacitracin, cidofovir, foscarnet, ganciclovir, gentamicin, pentamidine, vancomycin  NSAIDs, medicines for pain and inflammation, like ibuprofen or naproxen  other medicines that prolong the  QT interval (an abnormal heart rhythm)  pamidronate  zoledronic acid This list  may not describe all possible interactions. Give your health care provider a list of all the medicines, herbs, non-prescription drugs, or dietary supplements you use. Also tell them if you smoke, drink alcohol, or use illegal drugs. Some items may interact with your medicine. What should I watch for while using this medicine? Your condition will be monitored carefully while you are receiving this medicine. You may need blood work done while you are taking this medicine. This medicine may make you feel generally unwell. This is not uncommon as chemotherapy can affect healthy cells as well as cancer cells. Report any side effects. Continue your course of treatment even though you feel ill unless your healthcare professional tells you to stop. This medicine can make you more sensitive to cold. Do not drink cold drinks or use ice. Cover exposed skin before coming in contact with cold temperatures or cold objects. When out in cold weather wear warm clothing and cover your mouth and nose to warm the air that goes into your lungs. Tell your doctor if you get sensitive to the cold. Do not become pregnant while taking this medicine or for 9 months after stopping it. Women should inform their health care professional if they wish to become pregnant or think they might be pregnant. Men should not father a child while taking this medicine and for 6 months after stopping it. There is potential for serious side effects to an unborn child. Talk to your health care professional for more information. Do not breast-feed a child while taking this medicine or for 3 months after stopping it. This medicine has caused ovarian failure in some women. This medicine may make it more difficult to get pregnant. Talk to your health care professional if you are concerned about your fertility. This medicine has caused decreased sperm counts in some men. This may make it more difficult to father a child. Talk to your health care  professional if you are concerned about your fertility. This medicine may increase your risk of getting an infection. Call your health care professional for advice if you get a fever, chills, or sore throat, or other symptoms of a cold or flu. Do not treat yourself. Try to avoid being around people who are sick. Avoid taking medicines that contain aspirin, acetaminophen, ibuprofen, naproxen, or ketoprofen unless instructed by your health care professional. These medicines may hide a fever. Be careful brushing or flossing your teeth or using a toothpick because you may get an infection or bleed more easily. If you have any dental work done, tell your dentist you are receiving this medicine. What side effects may I notice from receiving this medicine? Side effects that you should report to your doctor or health care professional as soon as possible:  allergic reactions like skin rash, itching or hives, swelling of the face, lips, or tongue  breathing problems  cough  low blood counts - this medicine may decrease the number of white blood cells, red blood cells, and platelets. You may be at increased risk for infections and bleeding  nausea, vomiting  pain, redness, or irritation at site where injected  pain, tingling, numbness in the hands or feet  signs and symptoms of bleeding such as bloody or black, tarry stools; red or dark brown urine; spitting up blood or brown material that looks like coffee grounds; red spots on the skin; unusual bruising or bleeding from the eyes, gums,  or nose  signs and symptoms of a dangerous change in heartbeat or heart rhythm like chest pain; dizziness; fast, irregular heartbeat; palpitations; feeling faint or lightheaded; falls  signs and symptoms of infection like fever; chills; cough; sore throat; pain or trouble passing urine  signs and symptoms of liver injury like dark yellow or brown urine; general ill feeling or flu-like symptoms; light-colored stools;  loss of appetite; nausea; right upper belly pain; unusually weak or tired; yellowing of the eyes or skin  signs and symptoms of low red blood cells or anemia such as unusually weak or tired; feeling faint or lightheaded; falls  signs and symptoms of muscle injury like dark urine; trouble passing urine or change in the amount of urine; unusually weak or tired; muscle pain; back pain Side effects that usually do not require medical attention (report to your doctor or health care professional if they continue or are bothersome):  changes in taste  diarrhea  gas  hair loss  loss of appetite  mouth sores This list may not describe all possible side effects. Call your doctor for medical advice about side effects. You may report side effects to FDA at 1-800-FDA-1088. Where should I keep my medicine? This drug is given in a hospital or clinic and will not be stored at home. NOTE: This sheet is a summary. It may not cover all possible information. If you have questions about this medicine, talk to your doctor, pharmacist, or health care provider.  2020 Elsevier/Gold Standard (2018-06-06 12:20:35)  Leucovorin injection What is this medicine? LEUCOVORIN (loo koe VOR in) is used to prevent or treat the harmful effects of some medicines. This medicine is used to treat anemia caused by a low amount of folic acid in the body. It is also used with 5-fluorouracil (5-FU) to treat colon cancer. This medicine may be used for other purposes; ask your health care provider or pharmacist if you have questions. What should I tell my health care provider before I take this medicine? They need to know if you have any of these conditions:  anemia from low levels of vitamin B-12 in the blood  an unusual or allergic reaction to leucovorin, folic acid, other medicines, foods, dyes, or preservatives  pregnant or trying to get pregnant  breast-feeding How should I use this medicine? This medicine is for  injection into a muscle or into a vein. It is given by a health care professional in a hospital or clinic setting. Talk to your pediatrician regarding the use of this medicine in children. Special care may be needed. Overdosage: If you think you have taken too much of this medicine contact a poison control center or emergency room at once. NOTE: This medicine is only for you. Do not share this medicine with others. What if I miss a dose? This does not apply. What may interact with this medicine?  capecitabine  fluorouracil  phenobarbital  phenytoin  primidone  trimethoprim-sulfamethoxazole This list may not describe all possible interactions. Give your health care provider a list of all the medicines, herbs, non-prescription drugs, or dietary supplements you use. Also tell them if you smoke, drink alcohol, or use illegal drugs. Some items may interact with your medicine. What should I watch for while using this medicine? Your condition will be monitored carefully while you are receiving this medicine. This medicine may increase the side effects of 5-fluorouracil, 5-FU. Tell your doctor or health care professional if you have diarrhea or mouth sores that do not  get better or that get worse. What side effects may I notice from receiving this medicine? Side effects that you should report to your doctor or health care professional as soon as possible:  allergic reactions like skin rash, itching or hives, swelling of the face, lips, or tongue  breathing problems  fever, infection  mouth sores  unusual bleeding or bruising  unusually weak or tired Side effects that usually do not require medical attention (report to your doctor or health care professional if they continue or are bothersome):  constipation or diarrhea  loss of appetite  nausea, vomiting This list may not describe all possible side effects. Call your doctor for medical advice about side effects. You may report  side effects to FDA at 1-800-FDA-1088. Where should I keep my medicine? This drug is given in a hospital or clinic and will not be stored at home. NOTE: This sheet is a summary. It may not cover all possible information. If you have questions about this medicine, talk to your doctor, pharmacist, or health care provider.  2020 Elsevier/Gold Standard (2007-07-24 16:50:29)  Fluorouracil, 5-FU injection What is this medicine? FLUOROURACIL, 5-FU (flure oh YOOR a sil) is a chemotherapy drug. It slows the growth of cancer cells. This medicine is used to treat many types of cancer like breast cancer, colon or rectal cancer, pancreatic cancer, and stomach cancer. This medicine may be used for other purposes; ask your health care provider or pharmacist if you have questions. COMMON BRAND NAME(S): Adrucil What should I tell my health care provider before I take this medicine? They need to know if you have any of these conditions:  blood disorders  dihydropyrimidine dehydrogenase (DPD) deficiency  infection (especially a virus infection such as chickenpox, cold sores, or herpes)  kidney disease  liver disease  malnourished, poor nutrition  recent or ongoing radiation therapy  an unusual or allergic reaction to fluorouracil, other chemotherapy, other medicines, foods, dyes, or preservatives  pregnant or trying to get pregnant  breast-feeding How should I use this medicine? This drug is given as an infusion or injection into a vein. It is administered in a hospital or clinic by a specially trained health care professional. Talk to your pediatrician regarding the use of this medicine in children. Special care may be needed. Overdosage: If you think you have taken too much of this medicine contact a poison control center or emergency room at once. NOTE: This medicine is only for you. Do not share this medicine with others. What if I miss a dose? It is important not to miss your dose. Call  your doctor or health care professional if you are unable to keep an appointment. What may interact with this medicine?  allopurinol  cimetidine  dapsone  digoxin  hydroxyurea  leucovorin  levamisole  medicines for seizures like ethotoin, fosphenytoin, phenytoin  medicines to increase blood counts like filgrastim, pegfilgrastim, sargramostim  medicines that treat or prevent blood clots like warfarin, enoxaparin, and dalteparin  methotrexate  metronidazole  pyrimethamine  some other chemotherapy drugs like busulfan, cisplatin, estramustine, vinblastine  trimethoprim  trimetrexate  vaccines Talk to your doctor or health care professional before taking any of these medicines:  acetaminophen  aspirin  ibuprofen  ketoprofen  naproxen This list may not describe all possible interactions. Give your health care provider a list of all the medicines, herbs, non-prescription drugs, or dietary supplements you use. Also tell them if you smoke, drink alcohol, or use illegal drugs. Some items may interact  with your medicine. What should I watch for while using this medicine? Visit your doctor for checks on your progress. This drug may make you feel generally unwell. This is not uncommon, as chemotherapy can affect healthy cells as well as cancer cells. Report any side effects. Continue your course of treatment even though you feel ill unless your doctor tells you to stop. In some cases, you may be given additional medicines to help with side effects. Follow all directions for their use. Call your doctor or health care professional for advice if you get a fever, chills or sore throat, or other symptoms of a cold or flu. Do not treat yourself. This drug decreases your body's ability to fight infections. Try to avoid being around people who are sick. This medicine may increase your risk to bruise or bleed. Call your doctor or health care professional if you notice any unusual  bleeding. Be careful brushing and flossing your teeth or using a toothpick because you may get an infection or bleed more easily. If you have any dental work done, tell your dentist you are receiving this medicine. Avoid taking products that contain aspirin, acetaminophen, ibuprofen, naproxen, or ketoprofen unless instructed by your doctor. These medicines may hide a fever. Do not become pregnant while taking this medicine. Women should inform their doctor if they wish to become pregnant or think they might be pregnant. There is a potential for serious side effects to an unborn child. Talk to your health care professional or pharmacist for more information. Do not breast-feed an infant while taking this medicine. Men should inform their doctor if they wish to father a child. This medicine may lower sperm counts. Do not treat diarrhea with over the counter products. Contact your doctor if you have diarrhea that lasts more than 2 days or if it is severe and watery. This medicine can make you more sensitive to the sun. Keep out of the sun. If you cannot avoid being in the sun, wear protective clothing and use sunscreen. Do not use sun lamps or tanning beds/booths. What side effects may I notice from receiving this medicine? Side effects that you should report to your doctor or health care professional as soon as possible:  allergic reactions like skin rash, itching or hives, swelling of the face, lips, or tongue  low blood counts - this medicine may decrease the number of white blood cells, red blood cells and platelets. You may be at increased risk for infections and bleeding.  signs of infection - fever or chills, cough, sore throat, pain or difficulty passing urine  signs of decreased platelets or bleeding - bruising, pinpoint red spots on the skin, black, tarry stools, blood in the urine  signs of decreased red blood cells - unusually weak or tired, fainting spells, lightheadedness  breathing  problems  changes in vision  chest pain  mouth sores  nausea and vomiting  pain, swelling, redness at site where injected  pain, tingling, numbness in the hands or feet  redness, swelling, or sores on hands or feet  stomach pain  unusual bleeding Side effects that usually do not require medical attention (report to your doctor or health care professional if they continue or are bothersome):  changes in finger or toe nails  diarrhea  dry or itchy skin  hair loss  headache  loss of appetite  sensitivity of eyes to the light  stomach upset  unusually teary eyes This list may not describe all possible side effects.  Call your doctor for medical advice about side effects. You may report side effects to FDA at 1-800-FDA-1088. Where should I keep my medicine? This drug is given in a hospital or clinic and will not be stored at home. NOTE: This sheet is a summary. It may not cover all possible information. If you have questions about this medicine, talk to your doctor, pharmacist, or health care provider.  2020 Elsevier/Gold Standard (2007-05-23 13:53:16)  Coronavirus (COVID-19) Are you at risk?  Are you at risk for the Coronavirus (COVID-19)?  To be considered HIGH RISK for Coronavirus (COVID-19), you have to meet the following criteria:  . Traveled to [place], [place], [place], [place] or [place]; or in the [place] to [place], [place], [place], or [place]; and have fever, cough, and shortness of breath within the last 2 weeks of travel OR . Been in close contact with a person diagnosed with COVID-19 within the last 2 weeks and have fever, cough, and shortness of breath . IF YOU DO NOT MEET THESE CRITERIA, YOU ARE CONSIDERED LOW RISK FOR COVID-19.  What to do if you are HIGH RISK for COVID-19?  Marland Kitchen If you are having a medical emergency, call 911. . Seek medical care right away. Before you go to a doctor's office, urgent care or emergency department, call  ahead and tell them about your recent travel, contact with someone diagnosed with COVID-19, and your symptoms. You should receive instructions from your physician's office regarding next steps of care.  . When you arrive at healthcare provider, tell the healthcare staff immediately you have returned from visiting [place], [place], [place], [place] or [place]; or traveled in the [place] to [place], [place], [place], or [place]; in the last two weeks or you have been in close contact with a person diagnosed with COVID-19 in the last 2 weeks.   . Tell the health care staff about your symptoms: fever, cough and shortness of breath. . After you have been seen by a medical provider, you will be either: o Tested for (COVID-19) and discharged home on quarantine except to seek medical care if symptoms worsen, and asked to  - Stay home and avoid contact with others until you get your results (4-5 days)  - Avoid travel on public transportation if possible (such as bus, train, or airplane) or o Sent to the Emergency Department by EMS for evaluation, COVID-19 testing, and possible admission depending on your condition and test results.  What to do if you are LOW RISK for COVID-19?  Reduce your risk of any infection by using the same precautions used for avoiding the common cold or flu:  Marland Kitchen Wash your hands often with soap and warm water for at least 20 seconds.  If soap and water are not readily available, use an alcohol-based hand sanitizer with at least 60% alcohol.  . If coughing or sneezing, cover your mouth and nose by coughing or sneezing into the elbow areas of your shirt or coat, into a tissue or into your sleeve (not your hands). . Avoid shaking hands with others and consider head nods or verbal greetings only. . Avoid touching your eyes, nose, or mouth with unwashed hands.  . Avoid close contact with people who are sick. . Avoid places or events with large numbers of people in one location,  like concerts or sporting events. . Carefully consider travel plans you have or are making. . If you are planning any travel outside or  inside the [place], visit the CDC's Travelers' Health webpage for the latest health notices. . If you have some symptoms but not all symptoms, continue to monitor at home and seek medical attention if your symptoms worsen. . If you are having a medical emergency, call 911.   [place] / e-Visit: [email or web address]         [facility]: [facility]: [id number]                   [facility]: [phone]

## 2019-04-11 NOTE — Telephone Encounter (Signed)
  Called patient this morning due to him being late to his appointments. Patient stated that he was running late because of his transportation service always picking him up late even when he tells them what time his appointments are. Emailed Drucie Ip to look into this.   Patient: Clarence Andrews Birthdate: 04/19/1950 MRN: [id number] Phone #: [phone]

## 2019-04-12 ENCOUNTER — Telehealth: Payer: Self-pay | Admitting: *Deleted

## 2019-04-12 NOTE — Telephone Encounter (Signed)
Called to report he has appointment for 04/26/19 at 1:45 to see PA at office in [place].

## 2019-04-12 NOTE — Telephone Encounter (Signed)
Called patient to see if he was able to make an appt with a new PCP in [place]. RN gave him phone number to call. Unable to reach patient,left him a message.   Per Dr [name] request, RN to check glucose on Saturday to make patient aware of glucose level. At this time, he is NOT checking it at home.

## 2019-04-13 ENCOUNTER — Inpatient Hospital Stay: Payer: Medicare HMO

## 2019-04-13 ENCOUNTER — Other Ambulatory Visit: Payer: Self-pay

## 2019-04-13 VITALS — BP 100/73 | HR 113 | Temp 100.2°F | Resp 22

## 2019-04-13 DIAGNOSIS — D701 Agranulocytosis secondary to cancer chemotherapy: Secondary | ICD-10-CM | POA: Diagnosis not present

## 2019-04-13 DIAGNOSIS — R531 Weakness: Secondary | ICD-10-CM | POA: Diagnosis not present

## 2019-04-13 DIAGNOSIS — C19 Malignant neoplasm of rectosigmoid junction: Secondary | ICD-10-CM | POA: Diagnosis not present

## 2019-04-13 DIAGNOSIS — C787 Secondary malignant neoplasm of liver and intrahepatic bile duct: Secondary | ICD-10-CM | POA: Diagnosis not present

## 2019-04-13 DIAGNOSIS — E119 Type 2 diabetes mellitus without complications: Secondary | ICD-10-CM | POA: Diagnosis not present

## 2019-04-13 DIAGNOSIS — T451X5A Adverse effect of antineoplastic and immunosuppressive drugs, initial encounter: Secondary | ICD-10-CM | POA: Diagnosis not present

## 2019-04-13 DIAGNOSIS — R197 Diarrhea, unspecified: Secondary | ICD-10-CM | POA: Diagnosis not present

## 2019-04-13 DIAGNOSIS — Z5111 Encounter for antineoplastic chemotherapy: Secondary | ICD-10-CM | POA: Diagnosis not present

## 2019-04-13 DIAGNOSIS — C186 Malignant neoplasm of descending colon: Secondary | ICD-10-CM

## 2019-04-13 DIAGNOSIS — Z85810 Personal history of malignant neoplasm of tongue: Secondary | ICD-10-CM | POA: Diagnosis not present

## 2019-04-13 DIAGNOSIS — C187 Malignant neoplasm of sigmoid colon: Secondary | ICD-10-CM

## 2019-04-13 MED ORDER — SODIUM CHLORIDE 0.9% FLUSH
10.0000 mL | INTRAVENOUS | Status: DC | PRN
  Administered 2019-04-13: 10 mL
  Filled 2019-04-13: qty 10

## 2019-04-13 MED ORDER — HEPARIN SOD (PORK) LOCK FLUSH 100 UNIT/ML IV SOLN
500.0000 [IU] | Freq: Once | INTRAVENOUS | Status: AC | PRN
  Administered 2019-04-13: 500 [IU]
  Filled 2019-04-13: qty 5

## 2019-04-13 NOTE — Patient Instructions (Signed)

## 2019-04-13 NOTE — Progress Notes (Signed)
CBG 134 

## 2019-04-15 DIAGNOSIS — C186 Malignant neoplasm of descending colon: Secondary | ICD-10-CM | POA: Diagnosis not present

## 2019-04-15 LAB — GLUCOSE, CAPILLARY: Glucose-Capillary: 134 mg/dL — ABNORMAL HIGH (ref 70–99)

## 2019-04-17 ENCOUNTER — Encounter (HOSPITAL_COMMUNITY): Payer: Self-pay | Admitting: Oncology

## 2019-04-21 ENCOUNTER — Other Ambulatory Visit: Payer: Self-pay | Admitting: Oncology

## 2019-04-24 ENCOUNTER — Encounter: Payer: Self-pay | Admitting: Oncology

## 2019-04-24 NOTE — Progress Notes (Signed)
Called pt to introduce myself as his Arboriculturist.  Unfortunately there aren't any foundations offering copay assistance for his Dx and the type of ins he has.  I offered the [employer], went over what it covers and gave him the income requirement.  Pt would like to apply so he will bring proof of income on 04/25/19.  Once approved I will give him an expense sheet and my card for any questions or concerns he may have in the future.

## 2019-04-25 ENCOUNTER — Inpatient Hospital Stay: Payer: Medicare HMO

## 2019-04-25 ENCOUNTER — Encounter: Payer: Self-pay | Admitting: Oncology

## 2019-04-25 ENCOUNTER — Other Ambulatory Visit: Payer: Self-pay

## 2019-04-25 ENCOUNTER — Inpatient Hospital Stay (HOSPITAL_BASED_OUTPATIENT_CLINIC_OR_DEPARTMENT_OTHER): Payer: Medicare HMO | Admitting: Oncology

## 2019-04-25 VITALS — BP 118/68 | HR 80 | Temp 99.1°F | Resp 18 | Ht 74.0 in | Wt 240.4 lb

## 2019-04-25 DIAGNOSIS — C186 Malignant neoplasm of descending colon: Secondary | ICD-10-CM

## 2019-04-25 DIAGNOSIS — I483 Typical atrial flutter: Secondary | ICD-10-CM | POA: Diagnosis not present

## 2019-04-25 DIAGNOSIS — D509 Iron deficiency anemia, unspecified: Secondary | ICD-10-CM | POA: Diagnosis present

## 2019-04-25 DIAGNOSIS — C189 Malignant neoplasm of colon, unspecified: Secondary | ICD-10-CM | POA: Diagnosis not present

## 2019-04-25 DIAGNOSIS — E860 Dehydration: Secondary | ICD-10-CM | POA: Diagnosis present

## 2019-04-25 DIAGNOSIS — R9431 Abnormal electrocardiogram [ECG] [EKG]: Secondary | ICD-10-CM | POA: Diagnosis not present

## 2019-04-25 DIAGNOSIS — Z95828 Presence of other vascular implants and grafts: Secondary | ICD-10-CM

## 2019-04-25 DIAGNOSIS — E1165 Type 2 diabetes mellitus with hyperglycemia: Secondary | ICD-10-CM | POA: Diagnosis present

## 2019-04-25 DIAGNOSIS — E119 Type 2 diabetes mellitus without complications: Secondary | ICD-10-CM | POA: Diagnosis not present

## 2019-04-25 DIAGNOSIS — C187 Malignant neoplasm of sigmoid colon: Secondary | ICD-10-CM | POA: Diagnosis not present

## 2019-04-25 DIAGNOSIS — J1282 Pneumonia due to coronavirus disease 2019: Secondary | ICD-10-CM | POA: Diagnosis present

## 2019-04-25 DIAGNOSIS — C19 Malignant neoplasm of rectosigmoid junction: Secondary | ICD-10-CM | POA: Diagnosis present

## 2019-04-25 DIAGNOSIS — U071 COVID-19: Secondary | ICD-10-CM | POA: Diagnosis present

## 2019-04-25 DIAGNOSIS — I5023 Acute on chronic systolic (congestive) heart failure: Secondary | ICD-10-CM | POA: Diagnosis present

## 2019-04-25 DIAGNOSIS — R0989 Other specified symptoms and signs involving the circulatory and respiratory systems: Secondary | ICD-10-CM | POA: Diagnosis not present

## 2019-04-25 DIAGNOSIS — S199XXA Unspecified injury of neck, initial encounter: Secondary | ICD-10-CM | POA: Diagnosis not present

## 2019-04-25 DIAGNOSIS — I4891 Unspecified atrial fibrillation: Secondary | ICD-10-CM | POA: Diagnosis present

## 2019-04-25 DIAGNOSIS — C787 Secondary malignant neoplasm of liver and intrahepatic bile duct: Secondary | ICD-10-CM | POA: Diagnosis present

## 2019-04-25 DIAGNOSIS — I2510 Atherosclerotic heart disease of native coronary artery without angina pectoris: Secondary | ICD-10-CM | POA: Diagnosis present

## 2019-04-25 DIAGNOSIS — J189 Pneumonia, unspecified organism: Secondary | ICD-10-CM | POA: Diagnosis not present

## 2019-04-25 DIAGNOSIS — N179 Acute kidney failure, unspecified: Secondary | ICD-10-CM | POA: Diagnosis present

## 2019-04-25 DIAGNOSIS — D709 Neutropenia, unspecified: Secondary | ICD-10-CM | POA: Diagnosis present

## 2019-04-25 DIAGNOSIS — E1122 Type 2 diabetes mellitus with diabetic chronic kidney disease: Secondary | ICD-10-CM | POA: Diagnosis present

## 2019-04-25 DIAGNOSIS — K521 Toxic gastroenteritis and colitis: Secondary | ICD-10-CM | POA: Diagnosis present

## 2019-04-25 DIAGNOSIS — A0472 Enterocolitis due to Clostridium difficile, not specified as recurrent: Secondary | ICD-10-CM | POA: Diagnosis present

## 2019-04-25 DIAGNOSIS — I10 Essential (primary) hypertension: Secondary | ICD-10-CM | POA: Diagnosis not present

## 2019-04-25 DIAGNOSIS — M6281 Muscle weakness (generalized): Secondary | ICD-10-CM | POA: Diagnosis not present

## 2019-04-25 DIAGNOSIS — I484 Atypical atrial flutter: Secondary | ICD-10-CM | POA: Diagnosis not present

## 2019-04-25 DIAGNOSIS — T451X5A Adverse effect of antineoplastic and immunosuppressive drugs, initial encounter: Secondary | ICD-10-CM | POA: Diagnosis present

## 2019-04-25 DIAGNOSIS — I712 Thoracic aortic aneurysm, without rupture: Secondary | ICD-10-CM | POA: Diagnosis present

## 2019-04-25 DIAGNOSIS — Z85048 Personal history of other malignant neoplasm of rectum, rectosigmoid junction, and anus: Secondary | ICD-10-CM | POA: Diagnosis not present

## 2019-04-25 DIAGNOSIS — N182 Chronic kidney disease, stage 2 (mild): Secondary | ICD-10-CM | POA: Diagnosis present

## 2019-04-25 DIAGNOSIS — T380X5A Adverse effect of glucocorticoids and synthetic analogues, initial encounter: Secondary | ICD-10-CM | POA: Diagnosis present

## 2019-04-25 DIAGNOSIS — I4430 Unspecified atrioventricular block: Secondary | ICD-10-CM | POA: Diagnosis not present

## 2019-04-25 DIAGNOSIS — R6889 Other general symptoms and signs: Secondary | ICD-10-CM | POA: Diagnosis not present

## 2019-04-25 DIAGNOSIS — R000 Tachycardia, unspecified: Secondary | ICD-10-CM | POA: Diagnosis present

## 2019-04-25 DIAGNOSIS — I4892 Unspecified atrial flutter: Secondary | ICD-10-CM | POA: Diagnosis present

## 2019-04-25 LAB — CBC WITH DIFFERENTIAL (CANCER CENTER ONLY)
Abs Immature Granulocytes: 0.01 10*3/uL (ref 0.00–0.07)
Basophils Absolute: 0 10*3/uL (ref 0.0–0.1)
Basophils Relative: 0 %
Eosinophils Absolute: 0.1 10*3/uL (ref 0.0–0.5)
Eosinophils Relative: 5 %
HCT: 29.1 % — ABNORMAL LOW (ref 39.0–52.0)
Hemoglobin: 9.4 g/dL — ABNORMAL LOW (ref 13.0–17.0)
Immature Granulocytes: 1 %
Lymphocytes Relative: 26 %
Lymphs Abs: 0.4 10*3/uL — ABNORMAL LOW (ref 0.7–4.0)
MCH: 26.2 pg (ref 26.0–34.0)
MCHC: 32.3 g/dL (ref 30.0–36.0)
MCV: 81.1 fL (ref 80.0–100.0)
Monocytes Absolute: 0.7 10*3/uL (ref 0.1–1.0)
Monocytes Relative: 49 %
Neutro Abs: 0.3 10*3/uL — CL (ref 1.7–7.7)
Neutrophils Relative %: 19 %
Platelet Count: 221 10*3/uL (ref 150–400)
RBC: 3.59 MIL/uL — ABNORMAL LOW (ref 4.22–5.81)
RDW: 12.6 % (ref 11.5–15.5)
WBC Count: 1.4 10*3/uL — ABNORMAL LOW (ref 4.0–10.5)
nRBC: 0 % (ref 0.0–0.2)

## 2019-04-25 LAB — CMP (CANCER CENTER ONLY)
ALT: 38 U/L (ref 0–44)
AST: 38 U/L (ref 15–41)
Albumin: 2.5 g/dL — ABNORMAL LOW (ref 3.5–5.0)
Alkaline Phosphatase: 100 U/L (ref 38–126)
Anion gap: 10 (ref 5–15)
BUN: 20 mg/dL (ref 8–23)
CO2: 27 mmol/L (ref 22–32)
Calcium: 8.6 mg/dL — ABNORMAL LOW (ref 8.9–10.3)
Chloride: 101 mmol/L (ref 98–111)
Creatinine: 1.25 mg/dL — ABNORMAL HIGH (ref 0.61–1.24)
GFR, Est AFR Am: >60 mL/min (ref >60)
GFR, Estimated: 58 mL/min — ABNORMAL LOW (ref >60)
Glucose, Bld: 88 mg/dL (ref 70–99)
Potassium: 3.6 mmol/L (ref 3.5–5.1)
Sodium: 138 mmol/L (ref 135–145)
Total Bilirubin: 0.6 mg/dL (ref 0.3–1.2)
Total Protein: 6.5 g/dL (ref 6.5–8.1)

## 2019-04-25 MED ORDER — SODIUM CHLORIDE 0.9 % IV SOLN
Freq: Once | INTRAVENOUS | Status: AC
  Filled 2019-04-25: qty 250

## 2019-04-25 MED ORDER — LEVOFLOXACIN 500 MG PO TABS: 500.0000 mg | ORAL_TABLET | Freq: Every day | ORAL | 0 refills | Status: DC

## 2019-04-25 MED ORDER — HEPARIN SOD (PORK) LOCK FLUSH 100 UNIT/ML IV SOLN
500.0000 [IU] | Freq: Once | INTRAVENOUS | Status: AC
  Administered 2019-04-25: 500 [IU] via INTRAVENOUS
  Filled 2019-04-25: qty 5

## 2019-04-25 MED ORDER — SODIUM CHLORIDE 0.9% FLUSH
10.0000 mL | INTRAVENOUS | Status: DC | PRN
  Administered 2019-04-25: 10 mL via INTRAVENOUS
  Filled 2019-04-25: qty 10

## 2019-04-25 NOTE — Patient Instructions (Signed)

## 2019-04-25 NOTE — Progress Notes (Signed)
Pt is approved for the $1000 [employer] grant.  

## 2019-04-25 NOTE — Progress Notes (Signed)
[facility] OFFICE PROGRESS NOTE   Diagnosis: Colon cancer  INTERVAL HISTORY:   [name] completed cycle 1 FOLFOX on 04/11/2019.  No nausea or vomiting.  The mouth was sore following chemotherapy.  This has resolved.  He had cold sensitivity for approximately 5 days following chemotherapy.  No other neuropathy symptoms.  He did not sleep well last night.  He feels weak today.  He reports diarrhea 5-7 times per day since beginning chemotherapy.  He is taking MiraLAX twice daily.  The diarrhea is a low volume.  The stool is sometimes formed.  Objective:  Vital signs in last 24 hours:  Blood pressure 118/68, pulse 80, temperature 99.1 F (37.3 C), temperature source Temporal, resp. rate 18, height 6' 2"  (1.88 m), weight 240 lb 6.4 oz (109 kg), SpO2 100 %.    HEENT: No thrush or ulcers Resp: Lungs clear bilaterally Cardio: Regular rate and rhythm GI: Soft and nontender, no hepatomegaly Vascular: Trace ankle edema bilaterally  Skin: Palms without erythema  Portacath/PICC-without erythema  Lab Results:  Lab Results  Component Value Date   WBC 1.4 (L) 04/25/2019   HGB 9.4 (L) 04/25/2019   HCT 29.1 (L) 04/25/2019   MCV 81.1 04/25/2019   PLT 221 04/25/2019   NEUTROABS 0.3 (LL) 04/25/2019    CMP  Lab Results  Component Value Date   NA 138 04/25/2019   K 3.6 04/25/2019   CL 101 04/25/2019   CO2 27 04/25/2019   GLUCOSE 88 04/25/2019   BUN 20 04/25/2019   CREATININE 1.25 (H) 04/25/2019   CALCIUM 8.6 (L) 04/25/2019   PROT 6.5 04/25/2019   ALBUMIN 2.5 (L) 04/25/2019   AST 38 04/25/2019   ALT 38 04/25/2019   ALKPHOS 100 04/25/2019   BILITOT 0.6 04/25/2019   GFRNONAA 58 (L) 04/25/2019   GFRAA >60 04/25/2019    Lab Results  Component Value Date   CEA1 4.74 04/11/2019   Yes Medications: I have reviewed the patient's current medications.   Assessment/Plan: 1. 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 Colonoscopy  03/07/2019-nearly completely obstructing mass beginning at 16 cm from the anal verge, sigmoid colon polyp ? CTs 03/15/2019-wall thickening of the lower sigmoid colon, low rectal mass with significant luminal narrowing, diffuse liver metastases, borderline enlarged sigmoid mesocolon, iliac, and upper abdominal lymph nodes ? Elevated CEA ? Biopsy liver lesion 03/29/2019-metastatic adenocarcinoma to liver consistent with clinical impression of colorectal primary.  MSS, tumor mutation burden-3, K-ras G12D, PIK3CA mutations 2. 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 Diabetes 4. Coronary artery disease, status post coronary artery bypass surgery in 2017    Disposition: [name] completed cycle 1 FOLFOX on 04/11/2019.  He had diarrhea following chemotherapy.  The diarrhea persists.  He has been taking MiraLAX twice daily.  We recommended he hold the MiraLAX until the diarrhea resolves and then resume MiraLAX once daily.  He is tolerating liquids and solids.  He has severe neutropenia.  He will begin Levaquin prophylaxis.  He knows to call for a fever or symptoms of an infection.  Chemotherapy will be held today.  G-CSF will be added with the next cycle.  We reviewed potential toxicities associated with G-CSF including the chance of a rash, splenic rupture, and bone pain.  He agrees to proceed.  We will dose reduce the 5-fluorouracil infusion and eliminate the 5-FU bolus with cycle 2.  [name]  will receive IV fluids today.  He will return for an office visit and cycle 2 chemotherapy in 1 week.  Betsy Coder, MD  04/25/2019  2:01 PM

## 2019-04-25 NOTE — Patient Instructions (Signed)

## 2019-04-26 NOTE — Progress Notes (Signed)
Pharmacist Chemotherapy Monitoring - Follow Up Assessment    I verify that I have reviewed each item in the below checklist:  . Regimen for the patient is scheduled for the appropriate day and plan matches scheduled date. Marland Kitchen Appropriate non-routine labs are ordered dependent on drug ordered. . If applicable, additional medications reviewed and ordered per protocol based on lifetime cumulative doses and/or treatment regimen.   Plan for follow-up and/or issues identified: Yes . I-vent associated with next due treatment: Yes . MD and/or nursing notified: No   Kennith Center, Pharm.D., CPP 04/26/2019@5 :12 PM

## 2019-04-27 ENCOUNTER — Other Ambulatory Visit: Payer: Self-pay

## 2019-04-27 ENCOUNTER — Inpatient Hospital Stay: Payer: Medicare HMO

## 2019-04-27 DIAGNOSIS — Z79899 Other long term (current) drug therapy: Secondary | ICD-10-CM

## 2019-04-27 DIAGNOSIS — C787 Secondary malignant neoplasm of liver and intrahepatic bile duct: Secondary | ICD-10-CM | POA: Diagnosis present

## 2019-04-27 DIAGNOSIS — N182 Chronic kidney disease, stage 2 (mild): Secondary | ICD-10-CM | POA: Diagnosis present

## 2019-04-27 DIAGNOSIS — I4892 Unspecified atrial flutter: Secondary | ICD-10-CM | POA: Diagnosis present

## 2019-04-27 DIAGNOSIS — Z951 Presence of aortocoronary bypass graft: Secondary | ICD-10-CM

## 2019-04-27 DIAGNOSIS — J189 Pneumonia, unspecified organism: Secondary | ICD-10-CM | POA: Diagnosis not present

## 2019-04-27 DIAGNOSIS — E1122 Type 2 diabetes mellitus with diabetic chronic kidney disease: Secondary | ICD-10-CM | POA: Diagnosis present

## 2019-04-27 DIAGNOSIS — D709 Neutropenia, unspecified: Secondary | ICD-10-CM | POA: Diagnosis present

## 2019-04-27 DIAGNOSIS — Z85048 Personal history of other malignant neoplasm of rectum, rectosigmoid junction, and anus: Secondary | ICD-10-CM

## 2019-04-27 DIAGNOSIS — R000 Tachycardia, unspecified: Secondary | ICD-10-CM | POA: Diagnosis present

## 2019-04-27 DIAGNOSIS — T451X5A Adverse effect of antineoplastic and immunosuppressive drugs, initial encounter: Secondary | ICD-10-CM | POA: Diagnosis present

## 2019-04-27 DIAGNOSIS — K521 Toxic gastroenteritis and colitis: Secondary | ICD-10-CM | POA: Diagnosis present

## 2019-04-27 DIAGNOSIS — U071 COVID-19: Principal | ICD-10-CM | POA: Diagnosis present

## 2019-04-27 DIAGNOSIS — A0472 Enterocolitis due to Clostridium difficile, not specified as recurrent: Secondary | ICD-10-CM | POA: Diagnosis present

## 2019-04-27 DIAGNOSIS — Z85810 Personal history of malignant neoplasm of tongue: Secondary | ICD-10-CM

## 2019-04-27 DIAGNOSIS — Z803 Family history of malignant neoplasm of breast: Secondary | ICD-10-CM

## 2019-04-27 DIAGNOSIS — C189 Malignant neoplasm of colon, unspecified: Secondary | ICD-10-CM | POA: Diagnosis not present

## 2019-04-27 DIAGNOSIS — I2510 Atherosclerotic heart disease of native coronary artery without angina pectoris: Secondary | ICD-10-CM | POA: Diagnosis present

## 2019-04-27 DIAGNOSIS — Z8249 Family history of ischemic heart disease and other diseases of the circulatory system: Secondary | ICD-10-CM

## 2019-04-27 DIAGNOSIS — D509 Iron deficiency anemia, unspecified: Secondary | ICD-10-CM | POA: Diagnosis present

## 2019-04-27 DIAGNOSIS — I4430 Unspecified atrioventricular block: Secondary | ICD-10-CM | POA: Diagnosis not present

## 2019-04-27 DIAGNOSIS — I5023 Acute on chronic systolic (congestive) heart failure: Secondary | ICD-10-CM | POA: Diagnosis present

## 2019-04-27 DIAGNOSIS — Z7984 Long term (current) use of oral hypoglycemic drugs: Secondary | ICD-10-CM

## 2019-04-27 DIAGNOSIS — T380X5A Adverse effect of glucocorticoids and synthetic analogues, initial encounter: Secondary | ICD-10-CM | POA: Diagnosis present

## 2019-04-27 DIAGNOSIS — E1165 Type 2 diabetes mellitus with hyperglycemia: Secondary | ICD-10-CM | POA: Diagnosis present

## 2019-04-27 DIAGNOSIS — I712 Thoracic aortic aneurysm, without rupture: Secondary | ICD-10-CM | POA: Diagnosis present

## 2019-04-27 DIAGNOSIS — Z8371 Family history of colonic polyps: Secondary | ICD-10-CM

## 2019-04-27 DIAGNOSIS — C19 Malignant neoplasm of rectosigmoid junction: Secondary | ICD-10-CM | POA: Diagnosis present

## 2019-04-27 DIAGNOSIS — I4891 Unspecified atrial fibrillation: Secondary | ICD-10-CM | POA: Diagnosis present

## 2019-04-27 DIAGNOSIS — N179 Acute kidney failure, unspecified: Secondary | ICD-10-CM | POA: Diagnosis present

## 2019-04-27 DIAGNOSIS — E860 Dehydration: Secondary | ICD-10-CM | POA: Diagnosis present

## 2019-04-27 DIAGNOSIS — J1282 Pneumonia due to coronavirus disease 2019: Secondary | ICD-10-CM | POA: Diagnosis present

## 2019-04-28 ENCOUNTER — Inpatient Hospital Stay (HOSPITAL_COMMUNITY): Payer: Medicare HMO

## 2019-04-28 ENCOUNTER — Inpatient Hospital Stay (HOSPITAL_COMMUNITY)
Admission: EM | Admit: 2019-04-28 | Discharge: 2019-05-04 | DRG: 177 | Disposition: A | Discharge status: Home / Self Care | Payer: Medicare HMO | Attending: Internal Medicine | Admitting: Internal Medicine

## 2019-04-28 ENCOUNTER — Encounter (HOSPITAL_COMMUNITY): Payer: Self-pay

## 2019-04-28 ENCOUNTER — Emergency Department (HOSPITAL_COMMUNITY): Payer: Medicare HMO

## 2019-04-28 ENCOUNTER — Other Ambulatory Visit: Payer: Self-pay

## 2019-04-28 DIAGNOSIS — R9431 Abnormal electrocardiogram [ECG] [EKG]: Secondary | ICD-10-CM

## 2019-04-28 DIAGNOSIS — I5023 Acute on chronic systolic (congestive) heart failure: Secondary | ICD-10-CM

## 2019-04-28 DIAGNOSIS — E119 Type 2 diabetes mellitus without complications: Secondary | ICD-10-CM | POA: Diagnosis not present

## 2019-04-28 DIAGNOSIS — I484 Atypical atrial flutter: Secondary | ICD-10-CM | POA: Diagnosis not present

## 2019-04-28 DIAGNOSIS — R000 Tachycardia, unspecified: Secondary | ICD-10-CM

## 2019-04-28 DIAGNOSIS — I2510 Atherosclerotic heart disease of native coronary artery without angina pectoris: Secondary | ICD-10-CM | POA: Diagnosis present

## 2019-04-28 DIAGNOSIS — C189 Malignant neoplasm of colon, unspecified: Secondary | ICD-10-CM | POA: Diagnosis not present

## 2019-04-28 DIAGNOSIS — I712 Thoracic aortic aneurysm, without rupture: Secondary | ICD-10-CM | POA: Diagnosis present

## 2019-04-28 DIAGNOSIS — U071 COVID-19: Secondary | ICD-10-CM | POA: Diagnosis present

## 2019-04-28 DIAGNOSIS — D509 Iron deficiency anemia, unspecified: Secondary | ICD-10-CM | POA: Diagnosis present

## 2019-04-28 DIAGNOSIS — E860 Dehydration: Secondary | ICD-10-CM | POA: Diagnosis present

## 2019-04-28 DIAGNOSIS — I10 Essential (primary) hypertension: Secondary | ICD-10-CM | POA: Diagnosis not present

## 2019-04-28 DIAGNOSIS — I4892 Unspecified atrial flutter: Secondary | ICD-10-CM

## 2019-04-28 DIAGNOSIS — J189 Pneumonia, unspecified organism: Secondary | ICD-10-CM

## 2019-04-28 DIAGNOSIS — Z85048 Personal history of other malignant neoplasm of rectum, rectosigmoid junction, and anus: Secondary | ICD-10-CM | POA: Diagnosis not present

## 2019-04-28 DIAGNOSIS — D709 Neutropenia, unspecified: Secondary | ICD-10-CM | POA: Diagnosis present

## 2019-04-28 DIAGNOSIS — C19 Malignant neoplasm of rectosigmoid junction: Secondary | ICD-10-CM | POA: Diagnosis present

## 2019-04-28 DIAGNOSIS — I4891 Unspecified atrial fibrillation: Secondary | ICD-10-CM | POA: Diagnosis present

## 2019-04-28 DIAGNOSIS — E1122 Type 2 diabetes mellitus with diabetic chronic kidney disease: Secondary | ICD-10-CM | POA: Diagnosis present

## 2019-04-28 DIAGNOSIS — N179 Acute kidney failure, unspecified: Secondary | ICD-10-CM | POA: Diagnosis present

## 2019-04-28 DIAGNOSIS — K521 Toxic gastroenteritis and colitis: Secondary | ICD-10-CM | POA: Diagnosis present

## 2019-04-28 DIAGNOSIS — I483 Typical atrial flutter: Secondary | ICD-10-CM | POA: Diagnosis not present

## 2019-04-28 DIAGNOSIS — C186 Malignant neoplasm of descending colon: Secondary | ICD-10-CM | POA: Diagnosis not present

## 2019-04-28 DIAGNOSIS — J1282 Pneumonia due to coronavirus disease 2019: Secondary | ICD-10-CM | POA: Diagnosis present

## 2019-04-28 DIAGNOSIS — T451X5A Adverse effect of antineoplastic and immunosuppressive drugs, initial encounter: Secondary | ICD-10-CM | POA: Diagnosis present

## 2019-04-28 DIAGNOSIS — C787 Secondary malignant neoplasm of liver and intrahepatic bile duct: Secondary | ICD-10-CM | POA: Diagnosis present

## 2019-04-28 DIAGNOSIS — J188 Other pneumonia, unspecified organism: Secondary | ICD-10-CM | POA: Diagnosis present

## 2019-04-28 DIAGNOSIS — T380X5A Adverse effect of glucocorticoids and synthetic analogues, initial encounter: Secondary | ICD-10-CM | POA: Diagnosis present

## 2019-04-28 DIAGNOSIS — I4430 Unspecified atrioventricular block: Secondary | ICD-10-CM | POA: Diagnosis not present

## 2019-04-28 DIAGNOSIS — C187 Malignant neoplasm of sigmoid colon: Secondary | ICD-10-CM | POA: Diagnosis present

## 2019-04-28 DIAGNOSIS — A0472 Enterocolitis due to Clostridium difficile, not specified as recurrent: Secondary | ICD-10-CM | POA: Diagnosis present

## 2019-04-28 DIAGNOSIS — M6281 Muscle weakness (generalized): Secondary | ICD-10-CM | POA: Diagnosis not present

## 2019-04-28 DIAGNOSIS — N182 Chronic kidney disease, stage 2 (mild): Secondary | ICD-10-CM | POA: Diagnosis present

## 2019-04-28 DIAGNOSIS — E1165 Type 2 diabetes mellitus with hyperglycemia: Secondary | ICD-10-CM | POA: Diagnosis present

## 2019-04-28 HISTORY — DX: Pneumonia, unspecified organism: J18.9

## 2019-04-28 LAB — CBC WITH DIFFERENTIAL/PLATELET
Abs Immature Granulocytes: 0.18 10*3/uL — ABNORMAL HIGH (ref 0.00–0.07)
Basophils Absolute: 0 10*3/uL (ref 0.0–0.1)
Basophils Relative: 1 %
Eosinophils Absolute: 0.1 10*3/uL (ref 0.0–0.5)
Eosinophils Relative: 2 %
HCT: 33.8 % — ABNORMAL LOW (ref 39.0–52.0)
Hemoglobin: 10.6 g/dL — ABNORMAL LOW (ref 13.0–17.0)
Immature Granulocytes: 5 %
Lymphocytes Relative: 21 %
Lymphs Abs: 0.8 10*3/uL (ref 0.7–4.0)
MCH: 26.4 pg (ref 26.0–34.0)
MCHC: 31.4 g/dL (ref 30.0–36.0)
MCV: 84.3 fL (ref 80.0–100.0)
Monocytes Absolute: 1.1 10*3/uL — ABNORMAL HIGH (ref 0.1–1.0)
Monocytes Relative: 28 %
Neutro Abs: 1.7 10*3/uL (ref 1.7–7.7)
Neutrophils Relative %: 43 %
Platelets: 349 10*3/uL (ref 150–400)
RBC: 4.01 MIL/uL — ABNORMAL LOW (ref 4.22–5.81)
RDW: 13.1 % (ref 11.5–15.5)
WBC: 3.9 10*3/uL — ABNORMAL LOW (ref 4.0–10.5)
nRBC: 0 % (ref 0.0–0.2)

## 2019-04-28 LAB — COMPREHENSIVE METABOLIC PANEL
ALT: 35 U/L (ref 0–44)
AST: 33 U/L (ref 15–41)
Albumin: 2.7 g/dL — ABNORMAL LOW (ref 3.5–5.0)
Alkaline Phosphatase: 96 U/L (ref 38–126)
Anion gap: 10 (ref 5–15)
BUN: 21 mg/dL (ref 8–23)
CO2: 28 mmol/L (ref 22–32)
Calcium: 8.7 mg/dL — ABNORMAL LOW (ref 8.9–10.3)
Chloride: 98 mmol/L (ref 98–111)
Creatinine, Ser: 1.45 mg/dL — ABNORMAL HIGH (ref 0.61–1.24)
GFR calc Af Amer: 56 mL/min — ABNORMAL LOW (ref >60)
GFR calc non Af Amer: 48 mL/min — ABNORMAL LOW (ref >60)
Glucose, Bld: 181 mg/dL — ABNORMAL HIGH (ref 70–99)
Potassium: 3.5 mmol/L (ref 3.5–5.1)
Sodium: 136 mmol/L (ref 135–145)
Total Bilirubin: 0.8 mg/dL (ref 0.3–1.2)
Total Protein: 7.2 g/dL (ref 6.5–8.1)

## 2019-04-28 LAB — SARS CORONAVIRUS 2 (TAT 6-24 HRS): SARS Coronavirus 2: POSITIVE — AB

## 2019-04-28 LAB — URINALYSIS, ROUTINE W REFLEX MICROSCOPIC
Bilirubin Urine: NEGATIVE
Glucose, UA: 50 mg/dL — AB
Hgb urine dipstick: NEGATIVE
Ketones, ur: NEGATIVE mg/dL
Leukocytes,Ua: NEGATIVE
Nitrite: NEGATIVE
Protein, ur: NEGATIVE mg/dL
Specific Gravity, Urine: 1.006 (ref 1.005–1.030)
pH: 6 (ref 5.0–8.0)

## 2019-04-28 LAB — C DIFFICILE QUICK SCREEN W PCR REFLEX
C Diff antigen: POSITIVE — AB
C Diff toxin: NEGATIVE

## 2019-04-28 LAB — LACTIC ACID, PLASMA: Lactic Acid, Venous: 1.4 mmol/L (ref 0.5–1.9)

## 2019-04-28 LAB — GLUCOSE, CAPILLARY: Glucose-Capillary: 172 mg/dL — ABNORMAL HIGH (ref 70–99)

## 2019-04-28 LAB — CBG MONITORING, ED
Glucose-Capillary: 100 mg/dL — ABNORMAL HIGH (ref 70–99)
Glucose-Capillary: 189 mg/dL — ABNORMAL HIGH (ref 70–99)
Glucose-Capillary: 220 mg/dL — ABNORMAL HIGH (ref 70–99)
Glucose-Capillary: 213 mg/dL — ABNORMAL HIGH (ref 70–99)

## 2019-04-28 LAB — ECHOCARDIOGRAM COMPLETE
Height: 74 in
Weight: 3840 oz

## 2019-04-28 LAB — BRAIN NATRIURETIC PEPTIDE: B Natriuretic Peptide: 261.4 pg/mL — ABNORMAL HIGH (ref 0.0–100.0)

## 2019-04-28 MED ORDER — IOHEXOL 350 MG/ML SOLN
100.0000 mL | Freq: Once | INTRAVENOUS | Status: AC | PRN
  Administered 2019-04-28: 100 mL via INTRAVENOUS

## 2019-04-28 MED ORDER — VANCOMYCIN HCL IN DEXTROSE 1-5 GM/200ML-% IV SOLN
1000.0000 mg | Freq: Two times a day (BID) | INTRAVENOUS | Status: DC
  Administered 2019-04-28 – 2019-04-29 (×2): 1000 mg via INTRAVENOUS
  Filled 2019-04-28 (×2): qty 200

## 2019-04-28 MED ORDER — LOPERAMIDE HCL 2 MG PO CAPS
2.0000 mg | ORAL_CAPSULE | ORAL | Status: DC | PRN
  Administered 2019-04-28: 2 mg via ORAL
  Filled 2019-04-28: qty 1

## 2019-04-28 MED ORDER — SODIUM CHLORIDE 0.9% FLUSH
10.0000 mL | INTRAVENOUS | Status: DC | PRN
  Administered 2019-05-01: 10 mL

## 2019-04-28 MED ORDER — SODIUM CHLORIDE 0.9 % IV SOLN
2.0000 g | Freq: Once | INTRAVENOUS | Status: AC
  Administered 2019-04-28: 2 g via INTRAVENOUS
  Filled 2019-04-28: qty 2

## 2019-04-28 MED ORDER — LACTATED RINGERS IV SOLN: INTRAVENOUS | Status: DC

## 2019-04-28 MED ORDER — SODIUM CHLORIDE 0.9 % IV BOLUS
500.0000 mL | Freq: Once | INTRAVENOUS | Status: AC
  Administered 2019-04-28: 500 mL via INTRAVENOUS

## 2019-04-28 MED ORDER — METOPROLOL TARTRATE 25 MG PO TABS
25.0000 mg | ORAL_TABLET | Freq: Two times a day (BID) | ORAL | Status: DC
  Administered 2019-04-28 – 2019-04-29 (×3): 25 mg via ORAL
  Filled 2019-04-28 (×3): qty 1

## 2019-04-28 MED ORDER — SODIUM CHLORIDE (PF) 0.9 % IJ SOLN
INTRAMUSCULAR | Status: AC
  Filled 2019-04-28: qty 50

## 2019-04-28 MED ORDER — INSULIN ASPART 100 UNIT/ML ~~LOC~~ SOLN
0.0000 [IU] | Freq: Every day | SUBCUTANEOUS | Status: DC
  Administered 2019-04-29: 3 [IU] via SUBCUTANEOUS
  Administered 2019-04-30: 2 [IU] via SUBCUTANEOUS
  Administered 2019-05-01: 5 [IU] via SUBCUTANEOUS
  Administered 2019-05-02 – 2019-05-03 (×2): 4 [IU] via SUBCUTANEOUS
  Filled 2019-04-28: qty 0.05

## 2019-04-28 MED ORDER — VANCOMYCIN HCL 2000 MG/400ML IV SOLN
2000.0000 mg | Freq: Once | INTRAVENOUS | Status: AC
  Administered 2019-04-28: 2000 mg via INTRAVENOUS
  Filled 2019-04-28: qty 400

## 2019-04-28 MED ORDER — LEVALBUTEROL TARTRATE 45 MCG/ACT IN AERO: 1.0000 | INHALATION_SPRAY | Freq: Three times a day (TID) | RESPIRATORY_TRACT | Status: DC | PRN

## 2019-04-28 MED ORDER — SODIUM CHLORIDE 0.9 % IV SOLN: 2.0000 g | INTRAVENOUS | Status: DC

## 2019-04-28 MED ORDER — METOPROLOL TARTRATE 25 MG PO TABS: 12.5000 mg | ORAL_TABLET | Freq: Two times a day (BID) | ORAL | Status: DC

## 2019-04-28 MED ORDER — VITAMIN B-12 1000 MCG PO TABS
500.0000 ug | ORAL_TABLET | Freq: Every day | ORAL | Status: DC
  Administered 2019-04-28 – 2019-05-04 (×7): 500 ug via ORAL
  Filled 2019-04-28 (×7): qty 1

## 2019-04-28 MED ORDER — FERROUS SULFATE 325 (65 FE) MG PO TABS
325.0000 mg | ORAL_TABLET | Freq: Every day | ORAL | Status: DC
  Administered 2019-04-28 – 2019-05-04 (×7): 325 mg via ORAL
  Filled 2019-04-28 (×7): qty 1

## 2019-04-28 MED ORDER — DILTIAZEM LOAD VIA INFUSION
10.0000 mg | Freq: Once | INTRAVENOUS | Status: AC
  Administered 2019-04-28: 10 mg via INTRAVENOUS
  Filled 2019-04-28: qty 10

## 2019-04-28 MED ORDER — ENOXAPARIN SODIUM 40 MG/0.4ML ~~LOC~~ SOLN
40.0000 mg | SUBCUTANEOUS | Status: DC
  Administered 2019-04-28 – 2019-04-29 (×2): 40 mg via SUBCUTANEOUS
  Filled 2019-04-28 (×2): qty 0.4

## 2019-04-28 MED ORDER — ASCORBIC ACID 500 MG PO TABS
500.0000 mg | ORAL_TABLET | Freq: Every day | ORAL | Status: DC
  Administered 2019-04-28 – 2019-05-04 (×7): 500 mg via ORAL
  Filled 2019-04-28 (×7): qty 1

## 2019-04-28 MED ORDER — ACETAMINOPHEN 650 MG RE SUPP: 650.0000 mg | Freq: Four times a day (QID) | RECTAL | Status: DC | PRN

## 2019-04-28 MED ORDER — ZINC SULFATE 220 (50 ZN) MG PO CAPS
220.0000 mg | ORAL_CAPSULE | Freq: Every day | ORAL | Status: DC
  Administered 2019-04-28 – 2019-05-04 (×7): 220 mg via ORAL
  Filled 2019-04-28 (×7): qty 1

## 2019-04-28 MED ORDER — DILTIAZEM HCL-DEXTROSE 125-5 MG/125ML-% IV SOLN (PREMIX)
5.0000 mg/h | INTRAVENOUS | Status: DC
  Administered 2019-04-28: 5 mg/h via INTRAVENOUS
  Filled 2019-04-28: qty 125

## 2019-04-28 MED ORDER — METOPROLOL TARTRATE 5 MG/5ML IV SOLN
2.5000 mg | Freq: Four times a day (QID) | INTRAVENOUS | Status: DC | PRN
  Administered 2019-04-28 – 2019-05-02 (×5): 2.5 mg via INTRAVENOUS
  Filled 2019-04-28 (×5): qty 5

## 2019-04-28 MED ORDER — SODIUM CHLORIDE 0.9 % IV BOLUS
1000.0000 mL | Freq: Once | INTRAVENOUS | Status: AC
  Administered 2019-04-28: 1000 mL via INTRAVENOUS

## 2019-04-28 MED ORDER — SODIUM CHLORIDE 0.9 % IV SOLN
2.0000 g | Freq: Three times a day (TID) | INTRAVENOUS | Status: DC
  Administered 2019-04-28 – 2019-04-29 (×2): 2 g via INTRAVENOUS
  Filled 2019-04-28 (×3): qty 2

## 2019-04-28 MED ORDER — DEXAMETHASONE 4 MG PO TABS
6.0000 mg | ORAL_TABLET | Freq: Every day | ORAL | Status: DC
  Administered 2019-04-28 – 2019-05-04 (×7): 6 mg via ORAL
  Filled 2019-04-28 (×3): qty 2
  Filled 2019-04-28: qty 1
  Filled 2019-04-28 (×3): qty 2

## 2019-04-28 MED ORDER — CHLORHEXIDINE GLUCONATE CLOTH 2 % EX PADS
6.0000 | MEDICATED_PAD | Freq: Every day | CUTANEOUS | Status: DC
  Administered 2019-04-29 – 2019-05-03 (×5): 6 via TOPICAL

## 2019-04-28 MED ORDER — SODIUM CHLORIDE 0.9 % IV BOLUS
250.0000 mL | Freq: Once | INTRAVENOUS | Status: AC
  Administered 2019-04-28: 250 mL via INTRAVENOUS

## 2019-04-28 MED ORDER — OXYCODONE HCL 5 MG PO TABS
5.0000 mg | ORAL_TABLET | Freq: Four times a day (QID) | ORAL | Status: DC | PRN
  Administered 2019-04-28 – 2019-04-29 (×2): 5 mg via ORAL
  Filled 2019-04-28 (×2): qty 1

## 2019-04-28 MED ORDER — INSULIN ASPART 100 UNIT/ML ~~LOC~~ SOLN
0.0000 [IU] | Freq: Three times a day (TID) | SUBCUTANEOUS | Status: DC
  Administered 2019-04-28: 3 [IU] via SUBCUTANEOUS
  Administered 2019-04-28: 2 [IU] via SUBCUTANEOUS
  Administered 2019-04-29: 7 [IU] via SUBCUTANEOUS
  Filled 2019-04-28: qty 0.09

## 2019-04-28 MED ORDER — ACETAMINOPHEN 325 MG PO TABS
650.0000 mg | ORAL_TABLET | Freq: Four times a day (QID) | ORAL | Status: DC | PRN
  Administered 2019-04-28: 650 mg via ORAL
  Filled 2019-04-28: qty 2

## 2019-04-28 MED ORDER — POTASSIUM CHLORIDE CRYS ER 20 MEQ PO TBCR
40.0000 meq | EXTENDED_RELEASE_TABLET | Freq: Every day | ORAL | Status: AC
  Administered 2019-04-28 – 2019-04-30 (×3): 40 meq via ORAL
  Filled 2019-04-28 (×3): qty 2

## 2019-04-28 MED ORDER — MAGNESIUM OXIDE 400 MG PO TABS
800.0000 mg | ORAL_TABLET | Freq: Every day | ORAL | Status: DC
  Administered 2019-04-28: 800 mg via ORAL
  Filled 2019-04-28 (×2): qty 2

## 2019-04-28 MED ORDER — VITAMIN D 25 MCG (1000 UNIT) PO TABS
1000.0000 [IU] | ORAL_TABLET | Freq: Every day | ORAL | Status: DC
  Administered 2019-04-28 – 2019-05-04 (×7): 1000 [IU] via ORAL
  Filled 2019-04-28 (×7): qty 1

## 2019-04-28 NOTE — ED Notes (Signed)
Echocardiogram clicked off by mistake; order still pending. RN advised. [employer]

## 2019-04-28 NOTE — Progress Notes (Signed)
Updated the patient' son [name] via phone.  All questions answered to his satisfaction.  Please update family with new findings.

## 2019-04-28 NOTE — Progress Notes (Signed)
  Echocardiogram 2D Echocardiogram has been performed.  Clarence Andrews 04/28/2019, 12:18 PM

## 2019-04-28 NOTE — Progress Notes (Signed)
IV consult placed to connect IVFs to port a cath. Bedside RN unable to take phone call at this time. Spoke with Agricultural consultant to inform her that any RN can connect to [facility] that is already accessed, but only IV Team can disconnect IVFs per policy.  Randol Kern, RN VAST
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
## 2019-04-28 NOTE — Progress Notes (Signed)
Updated the patient's son Mr. Cliford Dabish via phone.  All questions answered.

## 2019-04-28 NOTE — Consult Note (Signed)
Cardiology Consultation:   Patient ID: Clarence Andrews. MRN: [id number]; DOB: [birth year]  Admit date: 04/28/2019 Date of Consult: 04/28/2019  Primary Care Provider: Patient, No Pcp Per Primary Cardiologist:   New, Christopherjohn Schiele  Primary Electrophysiologist:  None    Patient Profile:   Clarence Gean. is a 69 y.o. male with a hx of  CAD, CABG who is being seen today for the evaluation of  Atrial fib   at the request of  Dr. Nevada Crane. Marland Kitchen  History of Present Illness:   Clarence Andrews is a 69 year old gentleman with history of coronary artery disease and coronary artery bypass grafting.  He is from [place] and had his cardiac work done there.  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  He has a new diagnosis of colorectal cancer and is getting chemotherapy.  He has a history of type 2 diabetes mellitus and obesity.  He complains of generalized weakness and persistent diarrhea.  These episodes started more than 2 weeks ago after he started chemo.  He received IV fluid and then oncologist office.  He had neutropenia 3 days ago with a neutrophil count of 300. Chest x-ray suggestive of multifocal pneumonia.  Chest CT angiogram reveals no evidence of pulmonary embolus.  He is in a regular tachycardia at 140.  He has what appears to be sinus beats after PVCs.  I performed carotid sinus massage and he transiently slowed to 110 and then sped back up after a brief time.  This rules out reentrant supraventricular tachycardia.  He is not having any chest pain or shortness of breath.   Past Medical History:  Diagnosis Date  . Cancer ([place])   . Cardiac arrhythmia due to congenital heart disease   . CHF (congestive heart failure) ([place])   . Colitis   . Diabetes ([place])   . Paronychia of second toe of right foot   . Throat cancer [facility])     Past Surgical History:  Procedure Laterality Date  . APPENDECTOMY  1970  . CORONARY ARTERY BYPASS GRAFT     5  vessel   . IR IMAGING GUIDED PORT INSERTION  04/04/2019  . TOOTH EXTRACTION       Home Medications:  Prior to Admission medications   Medication Sig Start Date End Date Taking? Authorizing Provider  Cholecalciferol (D3-1000 PO) Take 1,000 Units by mouth daily.    Yes [provider]  Chromium-Cinnamon [phone] MCG-MG CAPS Take 2,000 mg by mouth daily.   Yes [provider]  ferrous sulfate 325 (65 FE) MG EC tablet Take 325 mg by mouth daily with breakfast.   Yes [provider]  glipiZIDE (GLUCOTROL) 10 MG tablet Take 10 mg by mouth daily before breakfast.   Yes [provider]  levofloxacin (LEVAQUIN) 500 MG tablet Take 1 tablet (500 mg total) by mouth daily for 7 days. 04/25/19 05/02/19 Yes Ladell Pier, MD  magnesium oxide (MAG-OX) 400 MG tablet Take 800 mg by mouth daily.    Yes [provider]  Multiple Vitamin (MULTIVITAMIN) tablet Take 1 tablet by mouth daily.   Yes [provider]  Turmeric 500 MG CAPS Take 2,000 mg by mouth daily. Taking 2 - 1000 mg tabs daily   Yes [provider]  UNABLE TO FIND Take 1 tablet by mouth daily. Med Name: Fungus Clear one daily   Yes [provider]  UNABLE TO FIND Take 3 tablets by mouth  daily. Med Name: Mag Blue 3 tabs daily   Yes [provider]  vitamin B-12 (CYANOCOBALAMIN) 500 MCG tablet Take 500 mcg by mouth daily.   Yes [provider]  lidocaine-prilocaine (EMLA) cream Apply to port site 1-2 hours prior to use 04/02/19   [place] Shark, NP  prochlorperazine (COMPAZINE) 10 MG tablet Take 1 tablet (10 mg total) by mouth every 6 (six) hours as needed for nausea or vomiting. 04/02/19   [place] Shark, NP    Inpatient Medications: Scheduled Meds: . cholecalciferol  1,000 Units Oral Daily  . enoxaparin (LOVENOX) injection  40 mg Subcutaneous Q24H  . ferrous sulfate  325 mg Oral Q breakfast  . insulin aspart  0-5 Units Subcutaneous QHS  . insulin aspart  0-9  Units Subcutaneous TID WC  . magnesium oxide  800 mg Oral Daily  . potassium chloride  40 mEq Oral Daily  . sodium chloride (PF)      . vitamin B-12  500 mcg Oral Daily   Continuous Infusions: . ceFEPime (MAXIPIME) IV    . diltiazem (CARDIZEM) infusion 5 mg/hr (04/28/19 1006)  . lactated ringers 75 mL/hr at 04/28/19 1038  . vancomycin     PRN Meds: acetaminophen **OR** acetaminophen, metoprolol tartrate  Allergies:   No Known Allergies  Social History:   Social History   Socioeconomic History  . Marital status: Single    Spouse name: Not on file  . Number of children: Not on file  . Years of education: Not on file  . Highest education level: Not on file  Occupational History  . Not on file  Tobacco Use  . Smoking status: Never Smoker  . Smokeless tobacco: Never Used  Substance and Sexual Activity  . Alcohol use: Not Currently  . Drug use: Never  . Sexual activity: Not on file  Other Topics Concern  . Not on file  Social History Narrative  . Not on file   Social Determinants of Health   Financial Resource Strain:   . Difficulty of Paying Living Expenses:   Food Insecurity:   . Worried About Charity fundraiser in the Last Year:   . Arboriculturist in the Last Year:   Transportation Needs:   . Film/video editor (Medical):   Marland Kitchen Lack of Transportation (Non-Medical):   Physical Activity:   . Days of Exercise per Week:   . Minutes of Exercise per Session:   Stress:   . Feeling of Stress :   Social Connections:   . Frequency of Communication with Friends and Family:   . Frequency of Social Gatherings with Friends and Family:   . Attends Religious Services:   . Active Member of Clubs or Organizations:   . Attends Archivist Meetings:   Marland Kitchen Marital Status:   Intimate Partner Violence:   . Fear of Current or Ex-Partner:   . Emotionally Abused:   Marland Kitchen Physically Abused:   . Sexually Abused:     Family History:    Family History  Problem Relation  Age of Onset  . Breast cancer Mother   . Colon polyps Father   . Heart disease Father   . Colon cancer Neg Hx   . Esophageal cancer Neg Hx   . Rectal cancer Neg Hx   . Stomach cancer Neg Hx      ROS:  Please see the history of present illness.   All other ROS reviewed and negative.  Physical Exam/Data:   Vitals:   04/28/19 1018 04/28/19 1019 04/28/19 1022 04/28/19 1038  BP: 107/84  107/84 117/89  Pulse:  88 (!) 142 (!) 142  Resp: (!) 9 18 12 10   Temp:      TempSrc:      SpO2:  (!) 86% 100% 97%  Weight:      Height:        Intake/Output Summary (Last 24 hours) at 04/28/2019 1125 Last data filed at 04/28/2019 0858 Gross per 24 hour  Intake 2271.1 ml  Output --  Net 2271.1 ml   Last 3 Weights 04/28/2019 04/25/2019 04/11/2019  Weight (lbs) 240 lb 240 lb 6.4 oz 246 lb  Weight (kg) 108.863 kg 109.045 kg 111.585 kg     Body mass index is 30.81 kg/m.  General:    Elderly male,  NAD  HEENT: normal Lymph: no adenopathy Neck: no JVD Endocrine:  No thryomegaly Vascular: No carotid bruits; FA pulses 2+ bilaterally without bruits  Cardiac:  RR , tachycardic  Lungs:  clear to auscultation bilaterally, no wheezing, rhonchi or rales  Abd: soft, nontender, no hepatomegaly  Ext: no edema Musculoskeletal:  No deformities, BUE and BLE strength normal and equal Skin: warm and dry  Neuro:  CNs 2-12 intact, no focal abnormalities noted Psych:  Normal affect   EKG:  The EKG was personally reviewed and demonstrates:  Sinus tach.  No ST or T wave changes  Telemetry:  Telemetry was personally reviewed and demonstrates:  Sinus tach.   With carotid sinus massage she slowed from 140s down to 110 and then gradually sped back up.  He had occasional premature ventricular contractions.  Relevant CV Studies: Echo is being done currently.  Laboratory Data:  High Sensitivity Troponin:  No results for input(s): TROPONINIHS in the last 720 hours.   Chemistry Recent Labs  Lab 04/25/19 1145  04/28/19 0134  NA 138 136  K 3.6 3.5  CL 101 98  CO2 27 28  GLUCOSE 88 181*  BUN 20 21  CREATININE 1.25* 1.45*  CALCIUM 8.6* 8.7*  GFRNONAA 58* 48*  GFRAA >60 56*  ANIONGAP 10 10    Recent Labs  Lab 04/25/19 1145 04/28/19 0134  PROT 6.5 7.2  ALBUMIN 2.5* 2.7*  AST 38 33  ALT 38 35  ALKPHOS 100 96  BILITOT 0.6 0.8   Hematology Recent Labs  Lab 04/25/19 1145 04/28/19 0134  WBC 1.4* 3.9*  RBC 3.59* 4.01*  HGB 9.4* 10.6*  HCT 29.1* 33.8*  MCV 81.1 84.3  MCH 26.2 26.4  MCHC 32.3 31.4  RDW 12.6 13.1  PLT 221 349   BNP Recent Labs  Lab 04/28/19 0134  BNP 261.4*    DDimer No results for input(s): DDIMER in the last 168 hours.   Radiology/Studies:  CT Angio Chest PE W and/or Wo Contrast  Result Date: 04/28/2019 CLINICAL DATA:  Neutropenic, history of colon cancer EXAM: CT ANGIOGRAPHY CHEST WITH CONTRAST TECHNIQUE: Multidetector CT imaging of the chest was performed using the standard protocol during bolus administration of intravenous contrast. Multiplanar CT image reconstructions and MIPs were obtained to evaluate the vascular anatomy. CONTRAST:  132mL OMNIPAQUE IOHEXOL 350 MG/ML SOLN COMPARISON:  03/15/2019 FINDINGS: Cardiovascular: Satisfactory opacification of the pulmonary arteries to the segmental level. No evidence of pulmonary embolism. Normal heart size. No pericardial effusion. Prior CABG. Ascending thoracic aortic aneurysm measuring 4.5 cm of the level of the right main pulmonary artery. Mediastinum/Nodes: No enlarged mediastinal, hilar, or axillary lymph nodes. Thyroid  gland, trachea, and esophagus demonstrate no significant findings. Lungs/Pleura: No pleural effusion or pneumothorax. Patchy areas of peripheral nodular airspace disease in the left upper lobe, bilateral lower lobes and to a lesser extent right middle lobe concerning for multilobar pneumonia. Upper Abdomen: Numerous hypodense hepatic masses with the largest in segment 6 measuring 4.1 cm most  consistent with metastatic disease. No acute upper abdominal abnormality. Musculoskeletal: No acute osseous abnormality. No aggressive osseous lesion. Anterior bridging osteophytes of the midthoracic spine as can be seen with diffuse idiopathic skeletal hyperostosis. Review of the MIP images confirms the above findings. IMPRESSION: 1. No evidence of pulmonary embolus. 2. Patchy areas of peripheral nodular airspace disease in the left upper lobe, bilateral lower lobes and to a lesser extent right middle lobe most concerning for multilobar pneumonia. 3. Numerous hypodense hepatic masses with the largest in segment 6 measuring 4.1 cm most consistent with metastatic disease. 4. Ascending thoracic aortic aneurysm measuring 4.5 cm. Ascending thoracic aortic aneurysm. Recommend semi-annual imaging followup by CTA or MRA and referral to cardiothoracic surgery if not already obtained. This recommendation follows 2010 ACCF/AHA/AATS/ACR/ASA/SCA/SCAI/SIR/STS/SVM Guidelines for the Diagnosis and Management of Patients With Thoracic Aortic Disease. Circulation. 2010; [id number]. Aortic aneurysm NOS (ICD10-I71.9) Electronically Signed   By: Kathreen Devoid   On: 04/28/2019 05:02   DG Chest Port 1 View  Result Date: 04/28/2019 CLINICAL DATA:  Neutropenic and week EXAM: PORTABLE CHEST 1 VIEW COMPARISON:  None. FINDINGS: The heart size and mediastinal contours are within normal limits. A right-sided MediPort catheter seen with the tip at the superior cavoatrial junction. There is mild prominence of the central pulmonary vasculature. No large airspace consolidation or pleural effusion. IMPRESSION: Pulmonary vascular congestion. Electronically Signed   By: Prudencio Pair M.D.   On: 04/28/2019 02:26    Assessment and Plan:   1. Sinus tachycardia: We were called for evaluation of his atrial fibrillation but I do not think this is atrial fibrillation.  It is difficult to see because it is going so fast but it is a fairly regular  tachycardia at 140 beats a minute it has not responded to Cardizem drip.  He slowed gradually to 110 beats minute with carotid sinus massage and then sped back up.  I think this is all related to his chemotherapy-induced  Enteritis and should improve with time.  We are getting an echocardiogram today for further evaluation of his LV function.  We will be able to tell whether he is in A. fib or sinus rhythm with the echo.  We will discontinue the Cardizem drip and start him on metoprolol 25 mg twice a day.  I am inclined also give him 1 dose of IV metoprolol if he does not slow down fairly soon.  He is in no distress.  2.  Neutropenia: The patient was neutropenic several days ago.  His white blood cell count is up slightly today.  If he remains sick and oncology consultation may be beneficial.  3.  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  Further plans per the medical team.   For questions or updates, please contact [place] Please consult [email or web address] for contact info under     Signed, Mertie Moores, MD  04/28/2019 11:25 AM

## 2019-04-28 NOTE — ED Notes (Signed)
Patient given lunch tray.

## 2019-04-28 NOTE — Progress Notes (Signed)
A consult was received from an ED physician for vanc/cefepime per pharmacy dosing.  The patient's profile has been reviewed for ht/wt/allergies/indication/available labs.   A one time order has been placed for vanc 2g and cefepime 2g.  Further antibiotics/pharmacy consults should be ordered by admitting physician if indicated.                       Thank you, Kara Mead 04/28/2019  5:25 AM

## 2019-04-28 NOTE — ED Notes (Signed)
Pt sitting up on the side of the bed. Denies any needs. Will continue to monitor.

## 2019-04-28 NOTE — Progress Notes (Signed)
Pharmacy Antibiotic Note  Clarence Andrews. is a 69 y.o. male admitted on 04/28/2019 with pneumonia.  Pharmacy has been consulted for HCAP dosing.  Plan:  Vancomycin 2000 mg IV x1 given in ED, then vancomycin 1000 mg IV q12h   Adjust cefepime to cefepime 2 gr IV q8h   Monitor clinical course, renal function, cultures as available   Height: 6\' 2"  (188 cm) Weight: 240 lb (108.9 kg) IBW/kg (Calculated) : 82.2  Temp (24hrs), Avg:98.3 F (36.8 C), Min:98.3 F (36.8 C), Max:98.3 F (36.8 C)  Recent Labs  Lab 04/25/19 1145 04/28/19 0134  WBC 1.4* 3.9*  CREATININE 1.25* 1.45*  LATICACIDVEN  --  1.4    Estimated Creatinine Clearance: 62.3 mL/min (A) (by C-G formula based on SCr of 1.45 mg/dL (H)).    No Known Allergies  Antimicrobials this admission: 3/28 vancomycin >>  3/28 cefepime >>   Dose adjustments this admission:   Microbiology results: 3/28 BCx: NGTD 3/28 UCx:   MRSA PCR:  C.diff PCR:  GI pathogen PCR:   Thank you for allowing pharmacy to be a part of this patient's care.   Royetta Asal, PharmD, BCPS 04/28/2019 9:22 AM

## 2019-04-28 NOTE — ED Provider Notes (Signed)
[facility] DEPT Provider Note   CSN: [id number] Arrival date & time: 04/27/19  2349     History Chief Complaint  Patient presents with  . Abnormal Lab    Cyris Lecher. is a 68 y.o. male.  The history is provided by the patient and medical records.  Abnormal Lab   69 year old male with history of CHF, diabetes, history of throat cancer, new diagnosis of colorectal cancer in February 2021, presenting to the ED with neutropenia and generalized weakness.  Patient just completed cycle 1 of FOLFOX on 04/11/2019.  He had a lot of diarrhea during this.  He was scheduled to start cycle 2 on 04/25/2019, however had severe neutropenia with WBC count of 1.4 and cycle 2 was delayed 1 week.  He was given IV fluids in office due to generalized weakness which he states helped, was also started on Levaquin prophylactically.  States he is continuously been drinking water at home but still feels dehydrated and very weak.  He is still able to get up and get around, take care of himself on a daily basis.  He does live alone and has not had any sick contacts or noted Covid exposures.  He denies any specific complaints such as cough, abdominal pain, or urinary symptoms.   He has not had any noted fevers-- has been checking every hour.  Past Medical History:  Diagnosis Date  . Cancer ([place])   . Cardiac arrhythmia due to congenital heart disease   . CHF (congestive heart failure) ([place])   . Colitis   . Diabetes ([place])   . Paronychia of second toe of right foot   . Throat cancer [facility])     Patient Active Problem List   Diagnosis Date Noted  . Goals of care, counseling/discussion 04/02/2019  . Cancer of left colon ([place]) 04/02/2019  . 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[place]) 04/02/2019    Past Surgical History:  Procedure Laterality Date  . APPENDECTOMY  1970  . CORONARY ARTERY BYPASS GRAFT     5 vessel   . IR IMAGING GUIDED PORT INSERTION  04/04/2019  . TOOTH  EXTRACTION         Family History  Problem Relation Age of Onset  . Breast cancer Mother   . Colon polyps Father   . Heart disease Father   . Colon cancer Neg Hx   . Esophageal cancer Neg Hx   . Rectal cancer Neg Hx   . Stomach cancer Neg Hx     Social History   Tobacco Use  . Smoking status: Never Smoker  . Smokeless tobacco: Never Used  Substance Use Topics  . Alcohol use: Not Currently  . Drug use: Never    Home Medications Prior to Admission medications   Medication Sig Start Date End Date Taking? Authorizing Provider  Cholecalciferol (D3-1000 PO) Take by mouth.    [provider]  Chromium-Cinnamon [phone] MCG-MG CAPS Take 2,000 mg by mouth daily.    [provider]  ferrous sulfate 325 (65 FE) MG EC tablet Take 325 mg by mouth daily with breakfast.    [provider]  glipiZIDE (GLUCOTROL) 10 MG tablet Take 10 mg by mouth daily before breakfast.    [provider]  levofloxacin (LEVAQUIN) 500 MG tablet Take 1 tablet (500 mg total) by mouth daily for 7 days. 04/25/19 05/02/19  Ladell Pier, MD  lidocaine-prilocaine (EMLA) cream Apply to port site 1-2 hours prior to use 04/02/19  Owens Shark, NP  magnesium oxide (MAG-OX) 400 MG tablet Take 400 mg by mouth daily. Takes 2 tabs daily    [provider]  Multiple Vitamin (MULTIVITAMIN) tablet Take 1 tablet by mouth daily.    [provider]  omeprazole (PRILOSEC) 40 MG capsule Take 40 mg by mouth daily.    [provider]  polyethylene glycol powder (GLYCOLAX/MIRALAX) 17 GM/SCOOP powder Take 17 g by mouth 2 (two) times daily. With 8 ounces of water    [provider]  prochlorperazine (COMPAZINE) 10 MG tablet Take 1 tablet (10 mg total) by mouth every 6 (six) hours as needed for nausea or vomiting. Patient not taking: Reported on 04/25/2019 04/02/19   Owens Shark, NP  Turmeric 500 MG CAPS Take 2,000 mg by mouth daily. Taking 2 - 1000 mg tabs daily     [provider]  UNABLE TO FIND Med Name: Fungus Clear one daily    [provider]  UNABLE TO FIND Med Name: Mag Blue 3 tabs daily    [provider]  vitamin B-12 (CYANOCOBALAMIN) 500 MCG tablet Take 500 mcg by mouth daily.    [provider]    Allergies    Patient has no known allergies.  Review of Systems   Review of Systems  Neurological: Positive for weakness.  All other systems reviewed and are negative.   Physical Exam Updated Vital Signs BP (!) 128/101   Pulse (!) 136   Temp 98.3 F (36.8 C) (Oral)   Resp 16   Ht 6\' 2"  (1.88 m)   Wt 108.9 kg   SpO2 100%   BMI 30.81 kg/m   Physical Exam Vitals and nursing note reviewed.  Constitutional:      Appearance: He is well-developed.     Comments: Elderly, appears weak but non-toxic  HENT:     Head: Normocephalic and atraumatic.  Eyes:     Conjunctiva/sclera: Conjunctivae normal.     Pupils: Pupils are equal, round, and reactive to light.  Cardiovascular:     Rate and Rhythm: Regular rhythm. Tachycardia present.     Heart sounds: Normal heart sounds.     Comments: Tachy in 130's during exam Pulmonary:     Effort: Pulmonary effort is normal.     Breath sounds: Normal breath sounds. No stridor. No wheezing.  Abdominal:     General: Bowel sounds are normal.     Palpations: Abdomen is soft.  Musculoskeletal:        General: Normal range of motion.     Cervical back: Normal range of motion.  Skin:    General: Skin is warm and dry.  Neurological:     Mental Status: He is alert and oriented to person, place, and time.     ED Results / Procedures / Treatments   Labs (all labs ordered are listed, but only abnormal results are displayed) Labs Reviewed  CBC WITH DIFFERENTIAL/PLATELET - Abnormal; Notable for the following components:      Result Value   WBC 3.9 (*)    RBC 4.01 (*)    Hemoglobin 10.6 (*)    HCT 33.8 (*)    Monocytes Absolute 1.1 (*)    Abs Immature  Granulocytes 0.18 (*)    All other components within normal limits  URINALYSIS, ROUTINE W REFLEX MICROSCOPIC - Abnormal; Notable for the following components:   Glucose, UA 50 (*)    All other components within normal limits  COMPREHENSIVE METABOLIC PANEL -  Abnormal; Notable for the following components:   Glucose, Bld 181 (*)    Creatinine, Ser 1.45 (*)    Calcium 8.7 (*)    Albumin 2.7 (*)    GFR calc non Af Amer 48 (*)    GFR calc Af Amer 56 (*)    All other components within normal limits  CULTURE, BLOOD (ROUTINE X 2)  CULTURE, BLOOD (ROUTINE X 2)  URINE CULTURE  SARS CORONAVIRUS 2 (TAT 6-24 HRS)  LACTIC ACID, PLASMA    EKG EKG Interpretation  Date/Time:  Sunday April 28 2019 03:46:37 EDT Ventricular Rate:  135 PR Interval:    QRS Duration: 99 QT Interval:  347 QTC Calculation: 521 R Axis:   -44 Text Interpretation: Sinus or ectopic atrial tachycardia Left axis deviation Borderline low voltage, extremity leads Prolonged QT interval No prior ecg for comparison Confirmed by Veryl Speak [id number]) on 04/28/2019 3:53:04 AM   Radiology CT Angio Chest PE W and/or Wo Contrast  Result Date: 04/28/2019 CLINICAL DATA:  Neutropenic, history of colon cancer EXAM: CT ANGIOGRAPHY CHEST WITH CONTRAST TECHNIQUE: Multidetector CT imaging of the chest was performed using the standard protocol during bolus administration of intravenous contrast. Multiplanar CT image reconstructions and MIPs were obtained to evaluate the vascular anatomy. CONTRAST:  168mL OMNIPAQUE IOHEXOL 350 MG/ML SOLN COMPARISON:  03/15/2019 FINDINGS: Cardiovascular: Satisfactory opacification of the pulmonary arteries to the segmental level. No evidence of pulmonary embolism. Normal heart size. No pericardial effusion. Prior CABG. Ascending thoracic aortic aneurysm measuring 4.5 cm of the level of the right main pulmonary artery. Mediastinum/Nodes: No enlarged mediastinal, hilar, or axillary lymph nodes. Thyroid gland, trachea,  and esophagus demonstrate no significant findings. Lungs/Pleura: No pleural effusion or pneumothorax. Patchy areas of peripheral nodular airspace disease in the left upper lobe, bilateral lower lobes and to a lesser extent right middle lobe concerning for multilobar pneumonia. Upper Abdomen: Numerous hypodense hepatic masses with the largest in segment 6 measuring 4.1 cm most consistent with metastatic disease. No acute upper abdominal abnormality. Musculoskeletal: No acute osseous abnormality. No aggressive osseous lesion. Anterior bridging osteophytes of the midthoracic spine as can be seen with diffuse idiopathic skeletal hyperostosis. Review of the MIP images confirms the above findings. IMPRESSION: 1. No evidence of pulmonary embolus. 2. Patchy areas of peripheral nodular airspace disease in the left upper lobe, bilateral lower lobes and to a lesser extent right middle lobe most concerning for multilobar pneumonia. 3. Numerous hypodense hepatic masses with the largest in segment 6 measuring 4.1 cm most consistent with metastatic disease. 4. Ascending thoracic aortic aneurysm measuring 4.5 cm. Ascending thoracic aortic aneurysm. Recommend semi-annual imaging followup by CTA or MRA and referral to cardiothoracic surgery if not already obtained. This recommendation follows 2010 ACCF/AHA/AATS/ACR/ASA/SCA/SCAI/SIR/STS/SVM Guidelines for the Diagnosis and Management of Patients With Thoracic Aortic Disease. Circulation. 2010; [id number]. Aortic aneurysm NOS (ICD10-I71.9) Electronically Signed   By: Kathreen Devoid   On: 04/28/2019 05:02   DG Chest Port 1 View  Result Date: 04/28/2019 CLINICAL DATA:  Neutropenic and week EXAM: PORTABLE CHEST 1 VIEW COMPARISON:  None. FINDINGS: The heart size and mediastinal contours are within normal limits. A right-sided MediPort catheter seen with the tip at the superior cavoatrial junction. There is mild prominence of the central pulmonary vasculature. No large airspace  consolidation or pleural effusion. IMPRESSION: Pulmonary vascular congestion. Electronically Signed   By: Prudencio Pair M.D.   On: 04/28/2019 02:26    Procedures Procedures (including critical care time)  Medications Ordered in ED  Medications  sodium chloride (PF) 0.9 % injection (has no administration in time range)  vancomycin (VANCOREADY) IVPB 2000 mg/400 mL (has no administration in time range)  ceFEPIme (MAXIPIME) 2 g in sodium chloride 0.9 % 100 mL IVPB (2 g Intravenous New Bag/Given (Non-Interop) 04/28/19 0544)  sodium chloride 0.9 % bolus 1,000 mL (1,000 mLs Intravenous New Bag/Given 04/28/19 0221)  iohexol (OMNIPAQUE) 350 MG/ML injection 100 mL (100 mLs Intravenous Contrast Given 04/28/19 0433)    ED Course  I have reviewed the triage vital signs and the nursing notes.  Pertinent labs & imaging results that were available during my care of the patient were reviewed by me and considered in my medical decision making (see chart for details).    MDM Rules/Calculators/A&P  69 y.o. M here with generalized weakness.  Newly diagnosed colorectal cancer s/p first round of FOLFOX.  Was due to start second round 04/25/19 which got delayed due to neutropenia.  States he just feels "wiped out".  Denies any specific pain or symptoms, specifically no fever/chills, cough, urinary symptoms.  He is afebrile, non-toxic here but does appear clinically weak and is tachycardic.  BP stable.  Will obtain labs, CXR, UA.  Given IVF, states this helped a lot the other day.  Labs grossly reassuring, WBC count has improved to 3.9 today (from 1.4 on 3/25).  Slight increase in SrCr compared with prior.  CXR clear.  UA without signs of infection.  After liter of fluids, HR still consistently in the 130's.  Given his history of active cancer, will obtain CTA chest to assess for PE.  CTA without findings of PE, does have patchy infiltrates concerning for multilobar pneumonia.  Tachycardia remains unchanged.  At this  point, he has already been on prophylactic Levaquin for neutropenia and still with development of pneumonia so will transition to vanc/cefepime and admit for ongoing care.  Discussed with hospitalist, Dr. Marlowe Sax-- will admit for ongoing care.  Final Clinical Impression(s) / ED Diagnoses Final diagnoses:  Multifocal pneumonia  Tachycardia  Malignant neoplasm of colon, unspecified part of colon [facility])    Rx / [place] Orders ED Discharge Orders    None       Larene Pickett, PA-C 04/28/19 0602    Veryl Speak, MD 04/28/19 [phone]

## 2019-04-28 NOTE — ED Notes (Addendum)
Pt standing up in the room. He accidentally urinated on the floor. Cleaned pt up and assisted back to bed. Warm blankets given. Will continue to monitor

## 2019-04-28 NOTE — ED Triage Notes (Signed)
Pt is neutropenic and feeling weak. Recently dx'd with colon cancer.

## 2019-04-28 NOTE — ED Notes (Signed)
Pt up to bedside commode. Will collect stool sample

## 2019-04-28 NOTE — ED Notes (Signed)
Pt lying in bed awake and talking. Full monitor on. Denies any needs. Will continue to monitor.

## 2019-04-28 NOTE — ED Notes (Signed)
Pt up to the bedside commode. Monitor placed. Pt denies any needs.

## 2019-04-28 NOTE — Progress Notes (Signed)
Positive COVID-19 test.  Airborne and enteric precautions in place.  + Diarrhea  O2 sat 99-100%  Will get inflammatory markers and trend.

## 2019-04-28 NOTE — ED Notes (Signed)
Patient lying comfortably in bed, denies any feelings of SOB or Flutter in Chest. Patient given 2.5 PRN metoprolol for HR= 141

## 2019-04-28 NOTE — Progress Notes (Signed)
In ST with HR in the 140's.  Asymptomatic.  Denies chest pain, palpitations or dyspnea.  Positive orthosthatic vital signs, on gentle IV fluid Hydration.  Has received IV fluid boluses in the ED.  Ordered IV diltiazem bolus 10 mg once and initiated diltiazem drip.  Goal HR 65-105.  Cardiology paged for consult, Dr. Marisue Ivan.

## 2019-04-28 NOTE — ED Notes (Signed)
Pt up to bedside commode, patient urinated all over floor. Upon entry patient speak with TRH and on bedside commode. NO distress, HR elevated >140s

## 2019-04-29 ENCOUNTER — Telehealth: Payer: Self-pay | Admitting: Oncology

## 2019-04-29 ENCOUNTER — Encounter (HOSPITAL_COMMUNITY): Payer: Self-pay | Admitting: Internal Medicine

## 2019-04-29 ENCOUNTER — Other Ambulatory Visit: Payer: Self-pay

## 2019-04-29 DIAGNOSIS — N179 Acute kidney failure, unspecified: Secondary | ICD-10-CM

## 2019-04-29 DIAGNOSIS — A0472 Enterocolitis due to Clostridium difficile, not specified as recurrent: Secondary | ICD-10-CM

## 2019-04-29 DIAGNOSIS — J1282 Pneumonia due to coronavirus disease 2019: Secondary | ICD-10-CM

## 2019-04-29 DIAGNOSIS — U071 COVID-19: Secondary | ICD-10-CM

## 2019-04-29 DIAGNOSIS — E119 Type 2 diabetes mellitus without complications: Secondary | ICD-10-CM

## 2019-04-29 DIAGNOSIS — C186 Malignant neoplasm of descending colon: Secondary | ICD-10-CM

## 2019-04-29 DIAGNOSIS — R000 Tachycardia, unspecified: Secondary | ICD-10-CM | POA: Diagnosis present

## 2019-04-29 DIAGNOSIS — C189 Malignant neoplasm of colon, unspecified: Secondary | ICD-10-CM

## 2019-04-29 HISTORY — DX: COVID-19: U07.1

## 2019-04-29 HISTORY — DX: Pneumonia due to coronavirus disease 2019: J12.82

## 2019-04-29 HISTORY — DX: Enterocolitis due to Clostridium difficile, not specified as recurrent: A04.72

## 2019-04-29 LAB — COMPREHENSIVE METABOLIC PANEL
ALT: 28 U/L (ref 0–44)
AST: 25 U/L (ref 15–41)
Albumin: 2.6 g/dL — ABNORMAL LOW (ref 3.5–5.0)
Alkaline Phosphatase: 88 U/L (ref 38–126)
Anion gap: 10 (ref 5–15)
BUN: 20 mg/dL (ref 8–23)
CO2: 25 mmol/L (ref 22–32)
Calcium: 8.7 mg/dL — ABNORMAL LOW (ref 8.9–10.3)
Chloride: 100 mmol/L (ref 98–111)
Creatinine, Ser: 1.22 mg/dL (ref 0.61–1.24)
GFR calc Af Amer: >60 mL/min (ref >60)
GFR calc non Af Amer: 60 mL/min — ABNORMAL LOW (ref >60)
Glucose, Bld: 298 mg/dL — ABNORMAL HIGH (ref 70–99)
Potassium: 4.5 mmol/L (ref 3.5–5.1)
Sodium: 135 mmol/L (ref 135–145)
Total Bilirubin: 0.6 mg/dL (ref 0.3–1.2)
Total Protein: 6.6 g/dL (ref 6.5–8.1)

## 2019-04-29 LAB — HEMOGLOBIN A1C
Hgb A1c MFr Bld: 9.7 % — ABNORMAL HIGH (ref 4.8–5.6)
Hgb A1c MFr Bld: 9.8 % — ABNORMAL HIGH (ref 4.8–5.6)
Mean Plasma Glucose: 231.69 mg/dL
Mean Plasma Glucose: 234.56 mg/dL

## 2019-04-29 LAB — URINE CULTURE

## 2019-04-29 LAB — TSH: TSH: 0.76 u[IU]/mL (ref 0.350–4.500)

## 2019-04-29 LAB — LIPID PANEL
Cholesterol: 144 mg/dL (ref 0–200)
HDL: 25 mg/dL — ABNORMAL LOW (ref >40)
LDL Cholesterol: 100 mg/dL — ABNORMAL HIGH (ref 0–99)
Total CHOL/HDL Ratio: 5.8 RATIO
Triglycerides: 93 mg/dL (ref <150)
VLDL: 19 mg/dL (ref 0–40)

## 2019-04-29 LAB — CBC WITH DIFFERENTIAL/PLATELET
Abs Immature Granulocytes: 0.13 10*3/uL — ABNORMAL HIGH (ref 0.00–0.07)
Basophils Absolute: 0 10*3/uL (ref 0.0–0.1)
Basophils Relative: 0 %
Eosinophils Absolute: 0 10*3/uL (ref 0.0–0.5)
Eosinophils Relative: 0 %
HCT: 32.3 % — ABNORMAL LOW (ref 39.0–52.0)
Hemoglobin: 10 g/dL — ABNORMAL LOW (ref 13.0–17.0)
Immature Granulocytes: 3 %
Lymphocytes Relative: 8 %
Lymphs Abs: 0.4 10*3/uL — ABNORMAL LOW (ref 0.7–4.0)
MCH: 25.8 pg — ABNORMAL LOW (ref 26.0–34.0)
MCHC: 31 g/dL (ref 30.0–36.0)
MCV: 83.5 fL (ref 80.0–100.0)
Monocytes Absolute: 0.5 10*3/uL (ref 0.1–1.0)
Monocytes Relative: 10 %
Neutro Abs: 3.9 10*3/uL (ref 1.7–7.7)
Neutrophils Relative %: 79 %
Platelets: 357 10*3/uL (ref 150–400)
RBC: 3.87 MIL/uL — ABNORMAL LOW (ref 4.22–5.81)
RDW: 13.3 % (ref 11.5–15.5)
WBC: 4.9 10*3/uL (ref 4.0–10.5)
nRBC: 0 % (ref 0.0–0.2)

## 2019-04-29 LAB — FERRITIN: Ferritin: 373 ng/mL — ABNORMAL HIGH (ref 24–336)

## 2019-04-29 LAB — PROCALCITONIN: Procalcitonin: <0.1 ng/mL

## 2019-04-29 LAB — MRSA PCR SCREENING: MRSA by PCR: NEGATIVE

## 2019-04-29 LAB — LACTATE DEHYDROGENASE: LDH: 159 U/L (ref 98–192)

## 2019-04-29 LAB — CLOSTRIDIUM DIFFICILE BY PCR, REFLEXED: Toxigenic C. Difficile by PCR: POSITIVE — AB

## 2019-04-29 LAB — MAGNESIUM: Magnesium: 1.3 mg/dL — ABNORMAL LOW (ref 1.7–2.4)

## 2019-04-29 LAB — GLUCOSE, CAPILLARY
Glucose-Capillary: 316 mg/dL — ABNORMAL HIGH (ref 70–99)
Glucose-Capillary: 331 mg/dL — ABNORMAL HIGH (ref 70–99)
Glucose-Capillary: 318 mg/dL — ABNORMAL HIGH (ref 70–99)
Glucose-Capillary: 240 mg/dL — ABNORMAL HIGH (ref 70–99)
Glucose-Capillary: 268 mg/dL — ABNORMAL HIGH (ref 70–99)

## 2019-04-29 LAB — C-REACTIVE PROTEIN: CRP: 8.5 mg/dL — ABNORMAL HIGH (ref <1.0)

## 2019-04-29 LAB — D-DIMER, QUANTITATIVE: D-Dimer, Quant: 0.57 ug/mL-FEU — ABNORMAL HIGH (ref 0.00–0.50)

## 2019-04-29 LAB — FIBRINOGEN: Fibrinogen: 323 mg/dL (ref 210–475)

## 2019-04-29 MED ORDER — SODIUM CHLORIDE 0.9 % IV SOLN
100.0000 mg | Freq: Once | INTRAVENOUS | Status: AC
  Administered 2019-04-29: 100 mg via INTRAVENOUS
  Filled 2019-04-29: qty 20

## 2019-04-29 MED ORDER — METOPROLOL TARTRATE 5 MG/5ML IV SOLN
2.5000 mg | INTRAVENOUS | Status: AC | PRN
  Administered 2019-04-29: 2.5 mg via INTRAVENOUS
  Filled 2019-04-29: qty 5

## 2019-04-29 MED ORDER — VANCOMYCIN 50 MG/ML ORAL SOLUTION
125.0000 mg | Freq: Four times a day (QID) | ORAL | Status: DC
  Administered 2019-04-29 – 2019-05-04 (×22): 125 mg via ORAL
  Filled 2019-04-29 (×25): qty 2.5

## 2019-04-29 MED ORDER — MAGNESIUM SULFATE 50 % IJ SOLN
3.0000 g | Freq: Once | INTRAVENOUS | Status: AC
  Administered 2019-04-29: 3 g via INTRAVENOUS
  Filled 2019-04-29: qty 6

## 2019-04-29 MED ORDER — MAGNESIUM SULFATE 2 GM/50ML IV SOLN
2.0000 g | Freq: Once | INTRAVENOUS | Status: AC
  Administered 2019-04-29: 2 g via INTRAVENOUS
  Filled 2019-04-29: qty 50

## 2019-04-29 MED ORDER — SODIUM CHLORIDE 0.9 % IV SOLN
100.0000 mg | Freq: Every day | INTRAVENOUS | Status: AC
  Administered 2019-04-30 – 2019-05-03 (×4): 100 mg via INTRAVENOUS
  Filled 2019-04-29 (×4): qty 20

## 2019-04-29 MED ORDER — INSULIN GLARGINE 100 UNIT/ML ~~LOC~~ SOLN
9.0000 [IU] | Freq: Every day | SUBCUTANEOUS | Status: DC
  Administered 2019-04-29: 9 [IU] via SUBCUTANEOUS
  Filled 2019-04-29 (×2): qty 0.09

## 2019-04-29 MED ORDER — METOPROLOL TARTRATE 50 MG PO TABS
50.0000 mg | ORAL_TABLET | Freq: Two times a day (BID) | ORAL | Status: DC
  Administered 2019-04-29 – 2019-04-30 (×2): 50 mg via ORAL
  Filled 2019-04-29 (×2): qty 1

## 2019-04-29 MED ORDER — INSULIN ASPART 100 UNIT/ML ~~LOC~~ SOLN
0.0000 [IU] | Freq: Three times a day (TID) | SUBCUTANEOUS | Status: DC
  Administered 2019-04-29 (×2): 15 [IU] via SUBCUTANEOUS
  Administered 2019-04-30: 11 [IU] via SUBCUTANEOUS
  Administered 2019-04-30: 7 [IU] via SUBCUTANEOUS
  Administered 2019-04-30 – 2019-05-01 (×2): 11 [IU] via SUBCUTANEOUS
  Administered 2019-05-01: 15 [IU] via SUBCUTANEOUS
  Administered 2019-05-02: 11 [IU] via SUBCUTANEOUS
  Administered 2019-05-02 (×2): 15 [IU] via SUBCUTANEOUS
  Administered 2019-05-03: 4 [IU] via SUBCUTANEOUS
  Administered 2019-05-03: 11 [IU] via SUBCUTANEOUS
  Administered 2019-05-03: 7 [IU] via SUBCUTANEOUS
  Administered 2019-05-04: 4 [IU] via SUBCUTANEOUS
  Administered 2019-05-04: 3 [IU] via SUBCUTANEOUS
  Administered 2019-05-04: 4 [IU] via SUBCUTANEOUS

## 2019-04-29 MED ORDER — INSULIN ASPART 100 UNIT/ML ~~LOC~~ SOLN
3.0000 [IU] | Freq: Three times a day (TID) | SUBCUTANEOUS | Status: DC
  Administered 2019-04-29 (×2): 3 [IU] via SUBCUTANEOUS

## 2019-04-29 MED ORDER — METOPROLOL TARTRATE 25 MG PO TABS
25.0000 mg | ORAL_TABLET | Freq: Once | ORAL | Status: AC
  Administered 2019-04-29: 25 mg via ORAL
  Filled 2019-04-29: qty 1

## 2019-04-29 MED ORDER — SODIUM CHLORIDE 0.9 % IV SOLN: INTRAVENOUS | Status: DC

## 2019-04-29 NOTE — Progress Notes (Addendum)
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 Date/Time   COLORURINE YELLOW 04/28/2019 0134   APPEARANCEUR CLEAR 04/28/2019 0134   LABSPEC 1.006 04/28/2019 0134   PHURINE 6.0 04/28/2019 0134   GLUCOSEU 50 (A) 04/28/2019 0134   HGBUR NEGATIVE 04/28/2019 0134   BILIRUBINUR NEGATIVE 04/28/2019 0134   KETONESUR NEGATIVE 04/28/2019 0134   PROTEINUR NEGATIVE 04/28/2019 0134   NITRITE NEGATIVE 04/28/2019 0134   LEUKOCYTESUR NEGATIVE 04/28/2019 0134     Mona Ayars M.D. [facility] 04/29/2019, 10:36 AM   Call night coverage person covering after 7pm

## 2019-04-29 NOTE — Progress Notes (Signed)
OT Cancellation Note  Patient Details Name: Clarence Andrews. MRN: [id number] DOB: [birth year]   Cancelled Treatment:    Reason Eval/Treat Not Completed: Medical issues which prohibited therapy; spoke with PT who's spoken with RN, pt with elevated HR this AM, will follow up for OT eval as able.  Lou Cal, OT Acute Rehabilitation Services Pager [phone] Office [phone]   Raymondo Band 04/29/2019, 11:49 AM

## 2019-04-29 NOTE — Telephone Encounter (Signed)
Scheduled per los. Called and spoke with patient. Patient in hospital. Tested positive for Covid. Informed MD, cancelled appts

## 2019-04-29 NOTE — Plan of Care (Signed)
  Problem: Education: Goal: Knowledge of risk factors and measures for prevention of condition will improve Outcome: Progressing   Problem: Coping: Goal: Psychosocial and spiritual needs will be supported Outcome: Progressing   Problem: Respiratory: Goal: Will maintain a patent airway Outcome: Progressing   

## 2019-04-29 NOTE — Progress Notes (Signed)
Spoke with Sharlet Salina NP regarding patients HR. Patient sustaining 120-140 ST. Per cardiology note HR suspected to improve once covid has improved. Per Sharlet Salina NP HR okay to be tachy but to page again if HR maintains 150's or higher. Will continue to monitor patient.
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
## 2019-04-29 NOTE — Progress Notes (Signed)
PT Cancellation Note  Patient Details Name: Clarence Andrews. MRN: [id number] DOB: 1950-05-20   Cancelled Treatment:    Reason Eval/Treat Not Completed: Medical issues which prohibited therapy RN requests to hold PT at this time due to elevated HR.   Tildon Silveria,KATHrine E 04/29/2019, 10:13 AM Arlyce Dice, DPT Acute Rehabilitation Services Office: [phone]

## 2019-04-30 ENCOUNTER — Other Ambulatory Visit: Payer: Self-pay

## 2019-04-30 ENCOUNTER — Encounter: Payer: Self-pay | Admitting: *Deleted

## 2019-04-30 DIAGNOSIS — J1282 Pneumonia due to coronavirus disease 2019: Secondary | ICD-10-CM

## 2019-04-30 DIAGNOSIS — U071 COVID-19: Principal | ICD-10-CM

## 2019-04-30 DIAGNOSIS — I484 Atypical atrial flutter: Secondary | ICD-10-CM

## 2019-04-30 LAB — CBC WITH DIFFERENTIAL/PLATELET
Abs Immature Granulocytes: 0.22 10*3/uL — ABNORMAL HIGH (ref 0.00–0.07)
Basophils Absolute: 0 10*3/uL (ref 0.0–0.1)
Basophils Relative: 0 %
Eosinophils Absolute: 0 10*3/uL (ref 0.0–0.5)
Eosinophils Relative: 0 %
HCT: 34 % — ABNORMAL LOW (ref 39.0–52.0)
Hemoglobin: 10.7 g/dL — ABNORMAL LOW (ref 13.0–17.0)
Immature Granulocytes: 2 %
Lymphocytes Relative: 7 %
Lymphs Abs: 0.7 10*3/uL (ref 0.7–4.0)
MCH: 26.2 pg (ref 26.0–34.0)
MCHC: 31.5 g/dL (ref 30.0–36.0)
MCV: 83.1 fL (ref 80.0–100.0)
Monocytes Absolute: 1.3 10*3/uL — ABNORMAL HIGH (ref 0.1–1.0)
Monocytes Relative: 13 %
Neutro Abs: 7.8 10*3/uL — ABNORMAL HIGH (ref 1.7–7.7)
Neutrophils Relative %: 78 %
Platelets: 451 10*3/uL — ABNORMAL HIGH (ref 150–400)
RBC: 4.09 MIL/uL — ABNORMAL LOW (ref 4.22–5.81)
RDW: 13.4 % (ref 11.5–15.5)
WBC: 10.1 10*3/uL (ref 4.0–10.5)
nRBC: 0.2 % (ref 0.0–0.2)

## 2019-04-30 LAB — GI PATHOGEN PANEL BY PCR, STOOL

## 2019-04-30 LAB — LACTATE DEHYDROGENASE: LDH: 148 U/L (ref 98–192)

## 2019-04-30 LAB — GLUCOSE, CAPILLARY
Glucose-Capillary: 215 mg/dL — ABNORMAL HIGH (ref 70–99)
Glucose-Capillary: 255 mg/dL — ABNORMAL HIGH (ref 70–99)
Glucose-Capillary: 268 mg/dL — ABNORMAL HIGH (ref 70–99)
Glucose-Capillary: 226 mg/dL — ABNORMAL HIGH (ref 70–99)

## 2019-04-30 LAB — FERRITIN: Ferritin: 356 ng/mL — ABNORMAL HIGH (ref 24–336)

## 2019-04-30 LAB — FIBRINOGEN: Fibrinogen: 612 mg/dL — ABNORMAL HIGH (ref 210–475)

## 2019-04-30 LAB — C-REACTIVE PROTEIN: CRP: 6.5 mg/dL — ABNORMAL HIGH (ref <1.0)

## 2019-04-30 LAB — D-DIMER, QUANTITATIVE: D-Dimer, Quant: 0.99 ug/mL-FEU — ABNORMAL HIGH (ref 0.00–0.50)

## 2019-04-30 MED ORDER — METOPROLOL TARTRATE 50 MG PO TABS
75.0000 mg | ORAL_TABLET | Freq: Two times a day (BID) | ORAL | Status: DC
  Administered 2019-05-01 – 2019-05-02 (×3): 75 mg via ORAL
  Filled 2019-04-30 (×3): qty 1

## 2019-04-30 MED ORDER — DILTIAZEM LOAD VIA INFUSION
10.0000 mg | Freq: Once | INTRAVENOUS | Status: AC
  Administered 2019-04-30: 10 mg via INTRAVENOUS
  Filled 2019-04-30: qty 10

## 2019-04-30 MED ORDER — DILTIAZEM HCL 30 MG PO TABS: 30.0000 mg | ORAL_TABLET | Freq: Two times a day (BID) | ORAL | Status: DC

## 2019-04-30 MED ORDER — APIXABAN 5 MG PO TABS
5.0000 mg | ORAL_TABLET | Freq: Two times a day (BID) | ORAL | Status: DC
  Administered 2019-04-30 – 2019-05-04 (×9): 5 mg via ORAL
  Filled 2019-04-30 (×9): qty 1

## 2019-04-30 MED ORDER — METOPROLOL TARTRATE 25 MG PO TABS
25.0000 mg | ORAL_TABLET | Freq: Once | ORAL | Status: AC
  Administered 2019-04-30: 25 mg via ORAL
  Filled 2019-04-30: qty 1

## 2019-04-30 MED ORDER — INSULIN ASPART 100 UNIT/ML ~~LOC~~ SOLN
4.0000 [IU] | Freq: Three times a day (TID) | SUBCUTANEOUS | Status: DC
  Administered 2019-04-30 – 2019-05-01 (×5): 4 [IU] via SUBCUTANEOUS

## 2019-04-30 MED ORDER — INSULIN GLARGINE 100 UNIT/ML ~~LOC~~ SOLN
14.0000 [IU] | Freq: Every day | SUBCUTANEOUS | Status: DC
  Administered 2019-04-30: 14 [IU] via SUBCUTANEOUS
  Filled 2019-04-30 (×2): qty 0.14

## 2019-04-30 MED ORDER — DILTIAZEM HCL-DEXTROSE 125-5 MG/125ML-% IV SOLN (PREMIX)
5.0000 mg/h | INTRAVENOUS | Status: DC
  Administered 2019-04-30: 7.5 mg/h via INTRAVENOUS
  Administered 2019-04-30: 5 mg/h via INTRAVENOUS
  Filled 2019-04-30: qty 125
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
## 2019-04-30 NOTE — Progress Notes (Signed)
Received record request from [facility] to help justify need for testing. Information needed for insurance company to approve the testing. Faxed office notes from 2/16, 3/02, and 3/25 (2021) and pathway documentation to [phone]

## 2019-04-30 NOTE — Progress Notes (Signed)
Started patient on Cardizem drip. HR decreased from 141 to 96 after 1 hour.

## 2019-04-30 NOTE — Progress Notes (Signed)
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 Signed   By: Markus Daft M.D.   On: 04/04/2019 15:10    Lab Data:  CBC: Recent Labs  Lab 04/25/19 1145 04/28/19 0134 04/29/19 0500 04/30/19 0850  WBC 1.4* 3.9* 4.9 10.1  NEUTROABS 0.3* 1.7 3.9 7.8*  HGB 9.4* 10.6* 10.0* 10.7*  HCT 29.1* 33.8* 32.3* 34.0*  MCV 81.1 84.3 83.5 83.1  PLT 221 349 357 [id number]*   Basic Metabolic Panel: Recent Labs  Lab 04/25/19 1145 04/28/19 0134 04/29/19 0500  NA 138 136 135  K 3.6 3.5 4.5  CL 101 98 100  CO2 27 28 25   GLUCOSE 88 181* 298*  BUN 20 21 20   CREATININE 1.25* 1.45* 1.22  CALCIUM 8.6* 8.7* 8.7*  MG  --   --  1.3*   GFR: Estimated Creatinine Clearance: 73.9  mL/min (by C-G formula based on SCr of 1.22 mg/dL). Liver Function Tests: Recent Labs  Lab 04/25/19 1145 04/28/19 0134 04/29/19 0500  AST 38 33 25  ALT 38 35 28  ALKPHOS 100 96 88  BILITOT 0.6 0.8 0.6  PROT 6.5 7.2 6.6  ALBUMIN 2.5* 2.7* 2.6*   No results for input(s): LIPASE, AMYLASE in the last 168 hours. No results for input(s): AMMONIA in the last 168 hours. Coagulation Profile: No results for input(s): INR, PROTIME in the last 168 hours. Cardiac Enzymes: No results for input(s): CKTOTAL, CKMB, CKMBINDEX, TROPONINI in the last 168 hours. BNP (last 3 results) No results for input(s): PROBNP in the last 8760 hours. HbA1C: Recent Labs    04/29/19 0500  HGBA1C 9.8*  9.7*   CBG: Recent Labs  Lab 04/29/19 1722 04/29/19 2159 04/29/19 2328 04/30/19 0808 04/30/19 1058  GLUCAP 318* 240* 268* 215* 255*   Lipid Profile: Recent Labs    04/29/19 0500  CHOL 144  HDL 25*  LDLCALC 100*  TRIG 93  CHOLHDL 5.8   Thyroid Function Tests: Recent Labs    04/29/19 0500  TSH 0.760   Anemia Panel: Recent Labs    04/29/19 0500 04/30/19 0230  FERRITIN 373* 356*   Urine analysis:    Component Value Date/Time   COLORURINE YELLOW 04/28/2019 0134   APPEARANCEUR CLEAR 04/28/2019 0134   LABSPEC 1.006 04/28/2019 0134   PHURINE 6.0 04/28/2019 0134   GLUCOSEU 50 (A) 04/28/2019 0134   HGBUR NEGATIVE 04/28/2019  [place] 04/28/2019 0134   KETONESUR NEGATIVE 04/28/2019 0134   PROTEINUR NEGATIVE 04/28/2019 0134   NITRITE NEGATIVE 04/28/2019 0134   LEUKOCYTESUR NEGATIVE 04/28/2019 0134     Lyfe Reihl M.D. [facility] 04/30/2019, 2:11 PM   Call night coverage person covering after 7pm

## 2019-04-30 NOTE — Progress Notes (Signed)
PT Cancellation Note  Patient Details Name: Clarence Andrews. MRN: [id number] DOB: [birth year]   Cancelled Treatment:    Reason Eval/Treat Not Completed: Medical issues which prohibited therapy Pt seen by cardiology and diagnosed with new atrial flutter.  Adjustments to meds being made.  Will check back as schedule permits.   Zinia Innocent,KATHrine E 04/30/2019, 12:00 PM Arlyce Dice, DPT Acute Rehabilitation Services Office: [phone]

## 2019-04-30 NOTE — Progress Notes (Signed)
OT Cancellation Note  Patient Details Name: Clarence Andrews. MRN: [id number] DOB: 1950/10/30   Cancelled Treatment:    Reason Eval/Treat Not Completed: Medical issues which prohibited therapy; RN cleared for OT session however when attempted noted pt currently/continuing to have resting HR 140-141. Will hold OT eval and follow up as able.  Lou Cal, OT Acute Rehabilitation Services Pager [phone] Office [phone]   Raymondo Band 04/30/2019, 2:46 PM

## 2019-05-01 DIAGNOSIS — E119 Type 2 diabetes mellitus without complications: Secondary | ICD-10-CM

## 2019-05-01 DIAGNOSIS — C787 Secondary malignant neoplasm of liver and intrahepatic bile duct: Secondary | ICD-10-CM

## 2019-05-01 DIAGNOSIS — C189 Malignant neoplasm of colon, unspecified: Secondary | ICD-10-CM

## 2019-05-01 DIAGNOSIS — I10 Essential (primary) hypertension: Secondary | ICD-10-CM

## 2019-05-01 DIAGNOSIS — I4892 Unspecified atrial flutter: Secondary | ICD-10-CM

## 2019-05-01 LAB — CBC WITH DIFFERENTIAL/PLATELET
Abs Immature Granulocytes: 0.62 10*3/uL — ABNORMAL HIGH (ref 0.00–0.07)
Basophils Absolute: 0.1 10*3/uL (ref 0.0–0.1)
Basophils Relative: 0 %
Eosinophils Absolute: 0 10*3/uL (ref 0.0–0.5)
Eosinophils Relative: 0 %
HCT: 35 % — ABNORMAL LOW (ref 39.0–52.0)
Hemoglobin: 11 g/dL — ABNORMAL LOW (ref 13.0–17.0)
Immature Granulocytes: 5 %
Lymphocytes Relative: 7 %
Lymphs Abs: 0.8 10*3/uL (ref 0.7–4.0)
MCH: 26.2 pg (ref 26.0–34.0)
MCHC: 31.4 g/dL (ref 30.0–36.0)
MCV: 83.3 fL (ref 80.0–100.0)
Monocytes Absolute: 1.4 10*3/uL — ABNORMAL HIGH (ref 0.1–1.0)
Monocytes Relative: 12 %
Neutro Abs: 9.1 10*3/uL — ABNORMAL HIGH (ref 1.7–7.7)
Neutrophils Relative %: 76 %
Platelets: 477 10*3/uL — ABNORMAL HIGH (ref 150–400)
RBC: 4.2 MIL/uL — ABNORMAL LOW (ref 4.22–5.81)
RDW: 13.8 % (ref 11.5–15.5)
WBC: 12 10*3/uL — ABNORMAL HIGH (ref 4.0–10.5)
nRBC: 0.3 % — ABNORMAL HIGH (ref 0.0–0.2)

## 2019-05-01 LAB — GLUCOSE, CAPILLARY
Glucose-Capillary: 264 mg/dL — ABNORMAL HIGH (ref 70–99)
Glucose-Capillary: 343 mg/dL — ABNORMAL HIGH (ref 70–99)
Glucose-Capillary: 475 mg/dL — ABNORMAL HIGH (ref 70–99)
Glucose-Capillary: 413 mg/dL — ABNORMAL HIGH (ref 70–99)
Glucose-Capillary: 402 mg/dL — ABNORMAL HIGH (ref 70–99)

## 2019-05-01 LAB — D-DIMER, QUANTITATIVE: D-Dimer, Quant: 0.82 ug/mL-FEU — ABNORMAL HIGH (ref 0.00–0.50)

## 2019-05-01 LAB — BASIC METABOLIC PANEL
Anion gap: 7 (ref 5–15)
BUN: 35 mg/dL — ABNORMAL HIGH (ref 8–23)
CO2: 29 mmol/L (ref 22–32)
Calcium: 9 mg/dL (ref 8.9–10.3)
Chloride: 99 mmol/L (ref 98–111)
Creatinine, Ser: 1.24 mg/dL (ref 0.61–1.24)
GFR calc Af Amer: >60 mL/min (ref >60)
GFR calc non Af Amer: 59 mL/min — ABNORMAL LOW (ref >60)
Glucose, Bld: 300 mg/dL — ABNORMAL HIGH (ref 70–99)
Potassium: 4.6 mmol/L (ref 3.5–5.1)
Sodium: 135 mmol/L (ref 135–145)

## 2019-05-01 LAB — FERRITIN: Ferritin: 323 ng/mL (ref 24–336)

## 2019-05-01 LAB — C-REACTIVE PROTEIN: CRP: 2.5 mg/dL — ABNORMAL HIGH (ref <1.0)

## 2019-05-01 MED ORDER — LOSARTAN POTASSIUM 50 MG PO TABS
50.0000 mg | ORAL_TABLET | Freq: Every day | ORAL | Status: DC
  Filled 2019-05-01: qty 1

## 2019-05-01 MED ORDER — DILTIAZEM HCL-DEXTROSE 125-5 MG/125ML-% IV SOLN (PREMIX)
5.0000 mg/h | INTRAVENOUS | Status: DC
  Administered 2019-05-01: 15 mg/h via INTRAVENOUS
  Administered 2019-05-01: 7.5 mg/h via INTRAVENOUS
  Filled 2019-05-01 (×5): qty 125

## 2019-05-01 MED ORDER — DIGOXIN 125 MCG PO TABS: 0.2500 mg | ORAL_TABLET | Freq: Every day | ORAL | Status: DC

## 2019-05-01 MED ORDER — INSULIN ASPART 100 UNIT/ML ~~LOC~~ SOLN
30.0000 [IU] | Freq: Once | SUBCUTANEOUS | Status: AC
  Administered 2019-05-01: 30 [IU] via SUBCUTANEOUS

## 2019-05-01 MED ORDER — INSULIN ASPART 100 UNIT/ML ~~LOC~~ SOLN: 6.0000 [IU] | Freq: Three times a day (TID) | SUBCUTANEOUS | Status: DC

## 2019-05-01 MED ORDER — LEVALBUTEROL TARTRATE 45 MCG/ACT IN AERO
2.0000 | INHALATION_SPRAY | Freq: Three times a day (TID) | RESPIRATORY_TRACT | Status: DC
  Administered 2019-05-02 – 2019-05-04 (×7): 2 via RESPIRATORY_TRACT
  Filled 2019-05-01: qty 15

## 2019-05-01 MED ORDER — LEVALBUTEROL TARTRATE 45 MCG/ACT IN AERO
2.0000 | INHALATION_SPRAY | Freq: Three times a day (TID) | RESPIRATORY_TRACT | Status: DC
  Administered 2019-05-01 (×2): 2 via RESPIRATORY_TRACT
  Filled 2019-05-01: qty 15

## 2019-05-01 MED ORDER — MOMETASONE FURO-FORMOTEROL FUM 100-5 MCG/ACT IN AERO
2.0000 | INHALATION_SPRAY | Freq: Two times a day (BID) | RESPIRATORY_TRACT | Status: DC
  Administered 2019-05-01 – 2019-05-04 (×7): 2 via RESPIRATORY_TRACT
  Filled 2019-05-01: qty 8.8

## 2019-05-01 MED ORDER — INSULIN GLARGINE 100 UNIT/ML ~~LOC~~ SOLN
18.0000 [IU] | Freq: Every day | SUBCUTANEOUS | Status: DC
  Administered 2019-05-01: 18 [IU] via SUBCUTANEOUS
  Filled 2019-05-01 (×2): qty 0.18

## 2019-05-01 MED ORDER — IPRATROPIUM BROMIDE HFA 17 MCG/ACT IN AERS
2.0000 | INHALATION_SPRAY | Freq: Three times a day (TID) | RESPIRATORY_TRACT | Status: DC
  Administered 2019-05-02 – 2019-05-04 (×7): 2 via RESPIRATORY_TRACT
  Filled 2019-05-01: qty 12.9

## 2019-05-01 MED ORDER — DIGOXIN 125 MCG PO TABS
0.1250 mg | ORAL_TABLET | Freq: Every day | ORAL | Status: DC
  Administered 2019-05-02 – 2019-05-04 (×3): 0.125 mg via ORAL
  Filled 2019-05-01 (×4): qty 1

## 2019-05-01 MED ORDER — IPRATROPIUM BROMIDE HFA 17 MCG/ACT IN AERS
2.0000 | INHALATION_SPRAY | Freq: Three times a day (TID) | RESPIRATORY_TRACT | Status: DC
  Administered 2019-05-01 (×2): 2 via RESPIRATORY_TRACT
  Filled 2019-05-01: qty 12.9

## 2019-05-01 MED ORDER — DIGOXIN 125 MCG PO TABS
0.2500 mg | ORAL_TABLET | Freq: Four times a day (QID) | ORAL | Status: AC
  Administered 2019-05-01 (×3): 0.25 mg via ORAL
  Filled 2019-05-01 (×3): qty 2

## 2019-05-01 NOTE — Care Management Important Message (Signed)
Important Message  Patient Details IM Letter given to Evette Cristal SW Case Manager to present to the Patient Name: Clarence Andrews. MRN: [id number] Date of Birth: [birth year]   Medicare Important Message Given:  Yes     Kerin Salen 05/01/2019, 11:40 AM

## 2019-05-01 NOTE — Evaluation (Signed)
Physical Therapy Evaluation Patient Details Name: Clarence Andrews. MRN: [id number] DOB: 1950/02/13 Today's Date: 05/01/2019   History of Present Illness  69 year old male with history of throat, tongue cancer diagnosed more than 5 years ago, oncologist in [place], [place], CABG 3 years ago, cardiologist in [place], newly, diagnosed colorectal cancer with ongoing chemotherapy, iron deficiency anemia, diabetes mellitus type 2 presented to ED with generalized weakness. admitted with multi-focal pna d/t covid 19  Clinical Impression  Pt admitted with above diagnosis.  Pt amb in room ~ 50', with IV pole/without pole. Gait stability improved with in room distance.  SpO2=97-100% on RA   Pt currently with functional limitations due to the deficits listed below (see PT Problem List). Pt will benefit from skilled PT to increase their independence and safety with mobility to allow discharge to the venue listed below.       Follow Up Recommendations No PT follow up    Equipment Recommendations  None recommended by PT    Recommendations for Other Services       Precautions / Restrictions Precautions Precautions: Fall Restrictions Weight Bearing Restrictions: No      Mobility  Bed Mobility Overal bed mobility: Needs Assistance Bed Mobility: Sit to Supine       Sit to supine: Min assist;Min guard   General bed mobility comments: assist with LEs onto bed, incr time  Transfers Overall transfer level: Needs assistance Equipment used: None Transfers: Sit to/from Stand Sit to Stand: Min guard         General transfer comment: for safety, incr time  Ambulation/Gait Ambulation/Gait assistance: Min guard;Min assist Gait Distance (Feet): 50 Feet(in room) Assistive device: IV Pole;None Gait Pattern/deviations: Step-through pattern;Decreased stride length Gait velocity: decr   General Gait Details: 27' with IV pole, 25' without IV pole, unsteady  initially however improved with time and  distance.  Stairs            Wheelchair Mobility    Modified Rankin (Stroke Patients Only)       Balance Overall balance assessment: Needs assistance Sitting-balance support: Feet supported;No upper extremity supported Sitting balance-Leahy Scale: Good     Standing balance support: No upper extremity supported;During functional activity Standing balance-Leahy Scale: Fair Standing balance comment: able to amb short distance in room without device/UE support. fair to good although not tested to moderate challenges                             Pertinent Vitals/Pain Pain Assessment: No/denies pain    Home Living Family/patient expects to be discharged to:: Private residence Living Arrangements: Alone           Home Layout: One level Home Equipment: None      Prior Function Level of Independence: Independent               Hand Dominance        Extremity/Trunk Assessment   Upper Extremity Assessment Upper Extremity Assessment: Overall WFL for tasks assessed    Lower Extremity Assessment Lower Extremity Assessment: Overall WFL for tasks assessed       Communication   Communication: No difficulties  Cognition Arousal/Alertness: Awake/alert Behavior During Therapy: WFL for tasks assessed/performed;Flat affect Overall Cognitive Status: Impaired/Different from baseline Area of Impairment: Following commands                       Following Commands: Follows one step commands with increased time  General Comments      Exercises     Assessment/Plan    PT Assessment Patient needs continued PT services  PT Problem List Decreased activity tolerance;Decreased knowledge of use of DME;Decreased mobility;Decreased balance       PT Treatment Interventions Therapeutic exercise;Gait training;Balance training;Functional mobility training;Patient/family education    PT Goals (Current goals can be found in the Care  Plan section)  Acute Rehab PT Goals Patient Stated Goal: home soon PT Goal Formulation: With patient Time For Goal Achievement: 05/07/19 Potential to Achieve Goals: Good    Frequency Min 3X/week   Barriers to discharge        Co-evaluation               AM-PAC PT "6 Clicks" Mobility  Outcome Measure Help needed turning from your back to your side while in a flat bed without using bedrails?: A Little Help needed moving from lying on your back to sitting on the side of a flat bed without using bedrails?: None Help needed moving to and from a bed to a chair (including a wheelchair)?: A Little Help needed standing up from a chair using your arms (e.g., wheelchair or bedside chair)?: A Little Help needed to walk in hospital room?: A Little Help needed climbing 3-5 steps with a railing? : A Little 6 Click Score: 19    End of Session   Activity Tolerance: Patient tolerated treatment well Patient left: in bed;with call bell/phone within reach;with bed alarm set Nurse Communication: Mobility status PT Visit Diagnosis: Difficulty in walking, not elsewhere classified (R26.2)    Time: [id number] PT Time Calculation (min) (ACUTE ONLY): 29 min   Charges:   PT Evaluation $PT Eval Low Complexity: 1 Low PT Treatments $Gait Training: 8-22 mins        Baxter Flattery, PT   Acute Rehab Dept [facility]): [id number]   05/01/2019   [facility] 05/01/2019, 1:44 PM

## 2019-05-01 NOTE — Evaluation (Signed)
Occupational Therapy Evaluation Patient Details Name: Clarence Andrews. MRN: [id number] DOB: 16-Mar-1950 Today's Date: 05/01/2019    History of Present Illness 69 year old male with history of throat, tongue cancer diagnosed more than 5 years ago, oncologist in [place], [place], CABG 3 years ago, cardiologist in [place], newly, diagnosed colorectal cancer with ongoing chemotherapy, iron deficiency anemia, diabetes mellitus type 2 presented to ED with generalized weakness. admitted with multi-focal pna d/t covid 19   Clinical Impression   This 69 y/o male presents with the above. PTA pt living alone, reports was independent with ADL and functional mobility. Pt completing room level mobility pushing iv pole at minguard assist level today, performing toileting and standing grooming ADL with minguard assist, requiring minguard- minA for LB ADL. Pt with mild unsteadiness but with no overt LOB noted; requires increased time/effort for transitions during mobility tasks. Pt on RA with SpO2 >92%, HR maintaining in the 70s-80s throughout. Pt reports he will only have minimal assistance intermittently at time of discharge. He will benefit from continued acute OT services, given current deficits and decreased caregiver support recommend follow up [facility] services after discharge to maximize overall safety and independence with ADL and mobility after return home. Will follow.     Follow Up Recommendations  Home health OT;Supervision - Intermittent    Equipment Recommendations  Tub/shower seat           Precautions / Restrictions Precautions Precautions: Fall Restrictions Weight Bearing Restrictions: No      Mobility Bed Mobility Overal bed mobility: Needs Assistance Bed Mobility: Supine to Sit;Sit to Supine     Supine to sit: Min guard Sit to supine: Min guard   General bed mobility comments: pt requiring notable increased time/effort to transition to EOB, increased time to bring LEs onto bed when  returning to supine  Transfers Overall transfer level: Needs assistance Equipment used: None Transfers: Sit to/from Stand Sit to Stand: Min guard         General transfer comment: for safety, incr time    Balance Overall balance assessment: Needs assistance Sitting-balance support: Feet supported;No upper extremity supported Sitting balance-Leahy Scale: Good     Standing balance support: No upper extremity supported;Single extremity supported;During functional activity Standing balance-Leahy Scale: Fair Standing balance comment: pt appears more stable with at least single UE support                           ADL either performed or assessed with clinical judgement   ADL Overall ADL's : Needs assistance/impaired Eating/Feeding: Modified independent;Sitting   Grooming: Min guard;Wash/dry hands;Standing Grooming Details (indicate cue type and reason): cues to locate grooming items  Upper Body Bathing: Set up;Sitting   Lower Body Bathing: Minimal assistance;Sit to/from stand   Upper Body Dressing : Set up;Sitting   Lower Body Dressing: Minimal assistance;Sit to/from stand   Toilet Transfer: Min guard;Supervision/safety;Ambulation;Grab bars;Regular Museum/gallery exhibitions officer and Hygiene: Min guard;Sit to/from stand Toileting - Clothing Manipulation Details (indicate cue type and reason): pt performing pericare (with encouragement to perform on his own) and clothing management     Functional mobility during ADLs: Min guard;Supervision/safety(pushing IV pole)                    Pertinent Vitals/Pain Pain Assessment: No/denies pain     Hand Dominance     Extremity/Trunk Assessment Upper Extremity Assessment Upper Extremity Assessment: Overall WFL for tasks assessed   Lower Extremity Assessment  Lower Extremity Assessment: Defer to PT evaluation   Cervical / Trunk Assessment Cervical / Trunk Assessment: Kyphotic   Communication  Communication Communication: No difficulties   Cognition Arousal/Alertness: Awake/alert Behavior During Therapy: WFL for tasks assessed/performed;Flat affect Overall Cognitive Status: Impaired/Different from baseline Area of Impairment: Problem solving;Following commands                       Following Commands: Follows one step commands with increased time     Problem Solving: Slow processing     General Comments  HR maintainining in the 70s-80s during activity, SpO2 >90% on RA    Exercises     Shoulder Instructions      Home Living Family/patient expects to be discharged to:: Private residence Living Arrangements: Alone Available Help at Discharge: Friend(s);Available PRN/intermittently(sounds like very minimal assist available)         Home Layout: One level     Bathroom Shower/Tub: Occupational psychologist: Standard     Home Equipment: None          Prior Functioning/Environment Level of Independence: Independent                 OT Problem List: Decreased strength;Decreased activity tolerance;Impaired balance (sitting and/or standing);Decreased knowledge of use of DME or AE;Cardiopulmonary status limiting activity      OT Treatment/Interventions: Self-care/ADL training;Therapeutic exercise;Energy conservation;DME and/or AE instruction;Therapeutic activities;Patient/family education;Balance training    OT Goals(Current goals can be found in the care plan section) Acute Rehab OT Goals Patient Stated Goal: home soon OT Goal Formulation: With patient Time For Goal Achievement: 05/15/19 Potential to Achieve Goals: Good  OT Frequency: Min 2X/week   Barriers to D/C:            Co-evaluation              AM-PAC OT "6 Clicks" Daily Activity     Outcome Measure Help from another person eating meals?: None Help from another person taking care of personal grooming?: None Help from another person toileting, which includes using  toliet, bedpan, or urinal?: A Little Help from another person bathing (including washing, rinsing, drying)?: A Little Help from another person to put on and taking off regular upper body clothing?: None Help from another person to put on and taking off regular lower body clothing?: A Little 6 Click Score: 21   End of Session Nurse Communication: Mobility status  Activity Tolerance: Patient tolerated treatment well Patient left: in bed;with call bell/phone within reach;with bed alarm set  OT Visit Diagnosis: Muscle weakness (generalized) (M62.81);Unsteadiness on feet (R26.81)                Time: [id number] OT Time Calculation (min): 29 min Charges:  OT General Charges $OT Visit: 1 Visit OT Evaluation $OT Eval Moderate Complexity: 1 Mod OT Treatments $Self Care/Home Management : 8-22 mins  Lou Cal, OT Acute Rehabilitation Services Pager [phone] Office [place] 05/01/2019, 4:45 PM

## 2019-05-01 NOTE — TOC Benefit Eligibility Note (Signed)
Transition of Care [facility]) Benefit Eligibility Note    Patient Details  Name: Clarence Andrews. MRN: [id number] Date of Birth: 09/22/1950   Medication/Dose: Eliquis 5 mg 2 x day for 30 days  Covered?: Yes  Tier: 3 Drug  Prescription Coverage Preferred Pharmacy: local  Spoke with Person/Company/Phone Number:: [employer] [phone]  Co-Pay: $45.00  Prior Approval: No  Deductible: Met       Kerin Salen Phone Number: 05/01/2019, 2:26 PM

## 2019-05-01 NOTE — Progress Notes (Addendum)
Patient's heart rate fluctuates between SR and aflutter. On tele appears SR, 12 lead EKG obtained and shows aflutter in 70s. Cardizem gtt continues to run at this time. Ardith Dark, NP made aware of situation, see orders.

## 2019-05-01 NOTE — TOC Benefit Eligibility Note (Signed)
Transition of Care [facility]) Benefit Eligibility Note    Patient Details  Name: Clarence Andrews. MRN: [id number] Date of Birth: 1950-04-21   Medication/Dose: Eliquis 5 mg 2 x day for 30 days  Covered?: Yes  Tier: 3 Drug  Prescription Coverage Preferred Pharmacy: local  Spoke with Person/Company/Phone Number:: [employer] [phone]  Co-Pay: $45.00  Prior Approval: No  Deductible: Met       Kerin Salen Phone Number: 05/01/2019, 2:27 PM
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
## 2019-05-01 NOTE — Discharge Instructions (Signed)

## 2019-05-02 ENCOUNTER — Ambulatory Visit: Payer: Medicare HMO

## 2019-05-02 ENCOUNTER — Other Ambulatory Visit: Payer: Medicare HMO

## 2019-05-02 ENCOUNTER — Ambulatory Visit: Payer: Medicare HMO | Admitting: Nurse Practitioner

## 2019-05-02 LAB — CBC WITH DIFFERENTIAL/PLATELET
Abs Immature Granulocytes: 0.65 10*3/uL — ABNORMAL HIGH (ref 0.00–0.07)
Basophils Absolute: 0.1 10*3/uL (ref 0.0–0.1)
Basophils Relative: 0 %
Eosinophils Absolute: 0 10*3/uL (ref 0.0–0.5)
Eosinophils Relative: 0 %
HCT: 34.2 % — ABNORMAL LOW (ref 39.0–52.0)
Hemoglobin: 10.5 g/dL — ABNORMAL LOW (ref 13.0–17.0)
Immature Granulocytes: 5 %
Lymphocytes Relative: 5 %
Lymphs Abs: 0.6 10*3/uL — ABNORMAL LOW (ref 0.7–4.0)
MCH: 25.7 pg — ABNORMAL LOW (ref 26.0–34.0)
MCHC: 30.7 g/dL (ref 30.0–36.0)
MCV: 83.8 fL (ref 80.0–100.0)
Monocytes Absolute: 1.1 10*3/uL — ABNORMAL HIGH (ref 0.1–1.0)
Monocytes Relative: 9 %
Neutro Abs: 9.6 10*3/uL — ABNORMAL HIGH (ref 1.7–7.7)
Neutrophils Relative %: 81 %
Platelets: 469 10*3/uL — ABNORMAL HIGH (ref 150–400)
RBC: 4.08 MIL/uL — ABNORMAL LOW (ref 4.22–5.81)
RDW: 13.9 % (ref 11.5–15.5)
WBC: 12 10*3/uL — ABNORMAL HIGH (ref 4.0–10.5)
nRBC: 0.6 % — ABNORMAL HIGH (ref 0.0–0.2)

## 2019-05-02 LAB — COMPREHENSIVE METABOLIC PANEL
ALT: 23 U/L (ref 0–44)
AST: 22 U/L (ref 15–41)
Albumin: 2.5 g/dL — ABNORMAL LOW (ref 3.5–5.0)
Alkaline Phosphatase: 84 U/L (ref 38–126)
Anion gap: 9 (ref 5–15)
BUN: 45 mg/dL — ABNORMAL HIGH (ref 8–23)
CO2: 25 mmol/L (ref 22–32)
Calcium: 8.8 mg/dL — ABNORMAL LOW (ref 8.9–10.3)
Chloride: 98 mmol/L (ref 98–111)
Creatinine, Ser: 1.4 mg/dL — ABNORMAL HIGH (ref 0.61–1.24)
GFR calc Af Amer: 58 mL/min — ABNORMAL LOW (ref >60)
GFR calc non Af Amer: 50 mL/min — ABNORMAL LOW (ref >60)
Glucose, Bld: 367 mg/dL — ABNORMAL HIGH (ref 70–99)
Potassium: 4.9 mmol/L (ref 3.5–5.1)
Sodium: 132 mmol/L — ABNORMAL LOW (ref 135–145)
Total Bilirubin: 0.4 mg/dL (ref 0.3–1.2)
Total Protein: 6.2 g/dL — ABNORMAL LOW (ref 6.5–8.1)

## 2019-05-02 LAB — GLUCOSE, CAPILLARY
Glucose-Capillary: 310 mg/dL — ABNORMAL HIGH (ref 70–99)
Glucose-Capillary: 307 mg/dL — ABNORMAL HIGH (ref 70–99)
Glucose-Capillary: 286 mg/dL — ABNORMAL HIGH (ref 70–99)
Glucose-Capillary: 315 mg/dL — ABNORMAL HIGH (ref 70–99)

## 2019-05-02 LAB — FERRITIN: Ferritin: 291 ng/mL (ref 24–336)

## 2019-05-02 LAB — MAGNESIUM: Magnesium: 1.7 mg/dL (ref 1.7–2.4)

## 2019-05-02 LAB — D-DIMER, QUANTITATIVE: D-Dimer, Quant: 0.64 ug/mL-FEU — ABNORMAL HIGH (ref 0.00–0.50)

## 2019-05-02 LAB — C-REACTIVE PROTEIN: CRP: 1.6 mg/dL — ABNORMAL HIGH (ref <1.0)

## 2019-05-02 LAB — PHOSPHORUS: Phosphorus: 2.6 mg/dL (ref 2.5–4.6)

## 2019-05-02 MED ORDER — VANCOMYCIN HCL 125 MG PO CAPS: 125.0000 mg | ORAL_CAPSULE | Freq: Four times a day (QID) | ORAL | 0 refills | Status: DC

## 2019-05-02 MED ORDER — INSULIN GLARGINE 100 UNIT/ML ~~LOC~~ SOLN
24.0000 [IU] | Freq: Every day | SUBCUTANEOUS | Status: DC
  Administered 2019-05-02: 24 [IU] via SUBCUTANEOUS
  Filled 2019-05-02 (×2): qty 0.24

## 2019-05-02 MED ORDER — METOPROLOL TARTRATE 25 MG PO TABS
25.0000 mg | ORAL_TABLET | Freq: Once | ORAL | Status: AC
  Administered 2019-05-02: 25 mg via ORAL
  Filled 2019-05-02: qty 1

## 2019-05-02 MED ORDER — METOPROLOL TARTRATE 50 MG PO TABS
100.0000 mg | ORAL_TABLET | Freq: Two times a day (BID) | ORAL | Status: DC
  Administered 2019-05-02 – 2019-05-04 (×4): 100 mg via ORAL
  Filled 2019-05-02 (×4): qty 2

## 2019-05-02 MED ORDER — MAGNESIUM SULFATE 4 GM/100ML IV SOLN
4.0000 g | Freq: Once | INTRAVENOUS | Status: AC
  Administered 2019-05-02: 4 g via INTRAVENOUS
  Filled 2019-05-02: qty 100

## 2019-05-02 MED ORDER — SACCHAROMYCES BOULARDII 250 MG PO CAPS
250.0000 mg | ORAL_CAPSULE | Freq: Two times a day (BID) | ORAL | Status: DC
  Administered 2019-05-02 – 2019-05-04 (×5): 250 mg via ORAL
  Filled 2019-05-02 (×5): qty 1

## 2019-05-02 MED ORDER — VANCOMYCIN HCL 125 MG PO CAPS: 125.0000 mg | ORAL_CAPSULE | Freq: Four times a day (QID) | ORAL | 0 refills | Status: AC

## 2019-05-02 MED ORDER — INSULIN ASPART 100 UNIT/ML ~~LOC~~ SOLN
10.0000 [IU] | Freq: Three times a day (TID) | SUBCUTANEOUS | Status: DC
  Administered 2019-05-02 – 2019-05-04 (×8): 10 [IU] via SUBCUTANEOUS

## 2019-05-02 NOTE — Progress Notes (Signed)
Paged Ardith Dark, NP regarding rectal temp 96.8

## 2019-05-02 NOTE — Progress Notes (Signed)
5 warm blankets from warmer placed on patient.

## 2019-05-02 NOTE — Progress Notes (Addendum)
Antimicrobial Stewardship - Medication Coverage for Vancomycin caps  Patient on vancomycin for C. Difficile.  Plan to discharge on vancomycin 125mg  caps to complete course.  Prescription sent to [facility] in [place] with permission of Dr Grandville Silos.  The prescription was declined by insurance (required prior [place]).  Prior Auth was completed with [employer].  Called back to [facility] to state medication approved by [facility].  Informed cost would be $90.50 for #20 caps.  Called patient and informed him of situation and cost of medication.  I was able to find good rx coupon at same [facility] for ~$64 and patient felt that would help.  I placed coupon and prescription in his chart for discharge (prescription also already sent electronically).  Patient is aware.  Doreene Eland, PharmD, BCPS.   Work Cell: [phone] 05/02/2019 3:53 PM
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
## 2019-05-02 NOTE — Progress Notes (Signed)
Progress Note  Patient Name: Clarence Andrews. Date of Encounter: 05/02/2019  Primary Cardiologist:  Kwabena Strutz   Subjective   69 yo male,  Hx of CAD ,  Admitted with diarrhea. Found to have covid. Has remained tachycardic   We started him on a Cardizem drip 2 days ago.  Last night his heart rate slowed down into the 40s.  The drip was discontinued and through the day his heart rates up into the 90s and 100 range.  He is currently on metoprolol and digoxin.  He seems to be gradually improving from a Covid pneumonia standpoint.    Inpatient Medications    Scheduled Meds: . apixaban  5 mg Oral BID  . vitamin C  500 mg Oral Daily  . Chlorhexidine Gluconate Cloth  6 each Topical Daily  . cholecalciferol  1,000 Units Oral Daily  . dexamethasone  6 mg Oral Daily  . digoxin  0.125 mg Oral Daily  . ferrous sulfate  325 mg Oral Q breakfast  . insulin aspart  0-20 Units Subcutaneous TID WC  . insulin aspart  0-5 Units Subcutaneous QHS  . insulin aspart  10 Units Subcutaneous TID WC  . insulin glargine  24 Units Subcutaneous Daily  . ipratropium  2 puff Inhalation TID  . levalbuterol  2 puff Inhalation TID  . metoprolol tartrate  100 mg Oral BID  . metoprolol tartrate  25 mg Oral Once  . mometasone-formoterol  2 puff Inhalation BID  . saccharomyces boulardii  250 mg Oral BID  . vancomycin  125 mg Oral QID  . vitamin B-12  500 mcg Oral Daily  . zinc sulfate  220 mg Oral Daily   Continuous Infusions: . remdesivir 100 mg in NS 100 mL 100 mg (05/02/19 0906)   PRN Meds: acetaminophen **OR** acetaminophen, levalbuterol, metoprolol tartrate, oxyCODONE, sodium chloride flush   Vital Signs    Vitals:   05/01/19 2326 05/02/19 0522 05/02/19 0843 05/02/19 1224  BP:  127/83 136/90 136/71  Pulse: 68 69 71 96  Resp:  20  20  Temp:  (!) 96.8 F (36 C) (!) 97.5 F (36.4 C) 97.9 F (36.6 C)  TempSrc:   Oral   SpO2:  100% 100% 99%  Weight:      Height:        Intake/Output Summary  (Last 24 hours) at 05/02/2019 1311 Last data filed at 05/02/2019 0900 Gross per 24 hour  Intake 240 ml  Output 1875 ml  Net -1635 ml   Filed Weights   04/29/19 0511 04/30/19 0500 05/01/19 0500  Weight: 111.5 kg 108.4 kg 110.5 kg   Last Weight  Most recent update: 05/01/2019  5:31 AM   Weight  110.5 kg (243 lb 9.7 oz)           Weight change:    Telemetry  Atrial flutter with a variable AV block.  Heart rate ranges from 90-1 10.  ECG    Atrial flutter with rapid ventricular response personally Reviewed  Physical Exam   Physical Exam: Blood pressure 136/71, pulse 96, temperature 97.9 F (36.6 C), resp. rate 20, height 6\' 2"  (1.88 m), weight 110.5 kg, SpO2 99 %.  GEN:  Well nourished, well developed in no acute distress HEENT: Normal NECK: No JVD; No carotid bruits LYMPHATICS: No lymphadenopathy CARDIAC: irreg. Irreg.  RESPIRATORY:  Clear to auscultation without rales, wheezing or rhonchi  ABDOMEN: Soft, non-tender, non-distended MUSCULOSKELETAL:  No edema; No deformity  SKIN: Warm and dry NEUROLOGIC:  Alert and oriented x 3   Labs    Hematology Recent Labs  Lab 04/30/19 0850 05/01/19 0508 05/02/19 0159  WBC 10.1 12.0* 12.0*  RBC 4.09* 4.20* 4.08*  HGB 10.7* 11.0* 10.5*  HCT 34.0* 35.0* 34.2*  MCV 83.1 83.3 83.8  MCH 26.2 26.2 25.7*  MCHC 31.5 31.4 30.7  RDW 13.4 13.8 13.9  PLT 451* 477* 469*    Chemistry Recent Labs  Lab 04/28/19 0134 04/28/19 0134 04/29/19 0500 05/01/19 0508 05/02/19 0159  NA 136   < > 135 135 132*  K 3.5   < > 4.5 4.6 4.9  CL 98   < > 100 99 98  CO2 28   < > 25 29 25   GLUCOSE 181*   < > 298* 300* 367*  BUN 21   < > 20 35* 45*  CREATININE 1.45*   < > 1.22 1.24 1.40*  CALCIUM 8.7*   < > 8.7* 9.0 8.8*  PROT 7.2  --  6.6  --  6.2*  ALBUMIN 2.7*  --  2.6*  --  2.5*  AST 33  --  25  --  22  ALT 35  --  28  --  23  ALKPHOS 96  --  88  --  84  BILITOT 0.8  --  0.6  --  0.4  GFRNONAA 48*   < > 60* 59* 50*  GFRAA 56*   < > >60  >60 58*  ANIONGAP 10   < > 10 7 9    < > = values in this interval not displayed.     High Sensitivity Troponin:  No results for input(s): TROPONINIHS in the last 720 hours.    BNP Recent Labs  Lab 04/28/19 0134  BNP 261.4*     DDimer  Recent Labs  Lab 04/30/19 0230 05/01/19 0508 05/02/19 0159  DDIMER 0.99* 0.82* 0.64*     Radiology    No results found.   Cardiac Studies   ECHO:  04/28/2019 1. Difficult to assess LV systolic function given tachycardia and poor  windows, but appears mildly to moderately reduced. Left ventricular  ejection fraction, by estimation, is 40 to 45%. Would consider repeat TTE  with contrast once heart rate better  controlled. There is moderate left ventricular hypertrophy. Left  ventricular diastolic parameters are indeterminate.  2. Right ventricle is poorly visualized, but function appears moderately  reduced  3. The mitral valve is normal in structure. No evidence of mitral valve  regurgitation.  4. The aortic valve is tricuspid. Aortic valve regurgitation is not  visualized. No aortic stenosis is present.  5. Aortic dilatation noted. 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  6. The inferior vena cava is dilated in size with <50% respiratory  variability, suggesting right atrial pressure of 15 mmHg.   Patient Profile     69 y.o. 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  Assessment & Plan    1. Atrial flutter:   Continue Eliquis 5 mg twice a day.  We will increase the metoprolol to 100 mg twice a day.  We have added digoxin 0.125 mg a day.  We will continue with rate controlling medications as well as anticoagulation.  Anticipate that he will be going home in  the next several days.  We  will see him in the office and make arrangements for cardioversion and further work-up after he is over Covid pneumonia.   4. Chronic systolic CHF Some of his mild LV dysfunction may be due to elevated heart rate.  His heart function should improve with rate control.  Left ventricular systolic function is mildly depressed.  We will continue further work-up after he is over Covid.  3. Ascending thoracic aortic aneurysm We will need to repeat his echocardiogram in approximately 6 months.   Mertie Moores, MD  05/02/2019 1:11 PM    [facility] [place],  [employer] [place], [place]  [address] Phone: [phone]; Fax: [phone]

## 2019-05-03 ENCOUNTER — Other Ambulatory Visit: Payer: Self-pay | Admitting: *Deleted

## 2019-05-03 ENCOUNTER — Telehealth: Payer: Self-pay | Admitting: Oncology

## 2019-05-03 LAB — CBC WITH DIFFERENTIAL/PLATELET
Abs Immature Granulocytes: 1.07 10*3/uL — ABNORMAL HIGH (ref 0.00–0.07)
Basophils Absolute: 0.1 10*3/uL (ref 0.0–0.1)
Basophils Relative: 1 %
Eosinophils Absolute: 0 10*3/uL (ref 0.0–0.5)
Eosinophils Relative: 0 %
HCT: 38 % — ABNORMAL LOW (ref 39.0–52.0)
Hemoglobin: 12 g/dL — ABNORMAL LOW (ref 13.0–17.0)
Immature Granulocytes: 6 %
Lymphocytes Relative: 5 %
Lymphs Abs: 0.9 10*3/uL (ref 0.7–4.0)
MCH: 26.5 pg (ref 26.0–34.0)
MCHC: 31.6 g/dL (ref 30.0–36.0)
MCV: 83.9 fL (ref 80.0–100.0)
Monocytes Absolute: 1.6 10*3/uL — ABNORMAL HIGH (ref 0.1–1.0)
Monocytes Relative: 8 %
Neutro Abs: 15.5 10*3/uL — ABNORMAL HIGH (ref 1.7–7.7)
Neutrophils Relative %: 80 %
Platelets: 489 10*3/uL — ABNORMAL HIGH (ref 150–400)
RBC: 4.53 MIL/uL (ref 4.22–5.81)
RDW: 14.3 % (ref 11.5–15.5)
WBC: 19.2 10*3/uL — ABNORMAL HIGH (ref 4.0–10.5)
nRBC: 0.7 % — ABNORMAL HIGH (ref 0.0–0.2)

## 2019-05-03 LAB — C-REACTIVE PROTEIN: CRP: 1.4 mg/dL — ABNORMAL HIGH (ref <1.0)

## 2019-05-03 LAB — COMPREHENSIVE METABOLIC PANEL
ALT: 27 U/L (ref 0–44)
AST: 28 U/L (ref 15–41)
Albumin: 2.7 g/dL — ABNORMAL LOW (ref 3.5–5.0)
Alkaline Phosphatase: 97 U/L (ref 38–126)
Anion gap: 9 (ref 5–15)
BUN: 44 mg/dL — ABNORMAL HIGH (ref 8–23)
CO2: 28 mmol/L (ref 22–32)
Calcium: 9.1 mg/dL (ref 8.9–10.3)
Chloride: 98 mmol/L (ref 98–111)
Creatinine, Ser: 1.15 mg/dL (ref 0.61–1.24)
GFR calc Af Amer: >60 mL/min (ref >60)
GFR calc non Af Amer: >60 mL/min (ref >60)
Glucose, Bld: 209 mg/dL — ABNORMAL HIGH (ref 70–99)
Potassium: 4.6 mmol/L (ref 3.5–5.1)
Sodium: 135 mmol/L (ref 135–145)
Total Bilirubin: 0.7 mg/dL (ref 0.3–1.2)
Total Protein: 6.7 g/dL (ref 6.5–8.1)

## 2019-05-03 LAB — CULTURE, BLOOD (ROUTINE X 2)
Culture: NO GROWTH
Culture: NO GROWTH
Special Requests: ADEQUATE
Special Requests: ADEQUATE

## 2019-05-03 LAB — MAGNESIUM: Magnesium: 2.7 mg/dL — ABNORMAL HIGH (ref 1.7–2.4)

## 2019-05-03 LAB — FERRITIN: Ferritin: 297 ng/mL (ref 24–336)

## 2019-05-03 LAB — D-DIMER, QUANTITATIVE: D-Dimer, Quant: 0.47 ug/mL-FEU (ref 0.00–0.50)

## 2019-05-03 LAB — GLUCOSE, CAPILLARY
Glucose-Capillary: 184 mg/dL — ABNORMAL HIGH (ref 70–99)
Glucose-Capillary: 250 mg/dL — ABNORMAL HIGH (ref 70–99)
Glucose-Capillary: 259 mg/dL — ABNORMAL HIGH (ref 70–99)
Glucose-Capillary: 317 mg/dL — ABNORMAL HIGH (ref 70–99)

## 2019-05-03 MED ORDER — INSULIN GLARGINE 100 UNIT/ML ~~LOC~~ SOLN
28.0000 [IU] | Freq: Every day | SUBCUTANEOUS | Status: DC
  Administered 2019-05-03 – 2019-05-04 (×2): 28 [IU] via SUBCUTANEOUS
  Filled 2019-05-03 (×2): qty 0.28

## 2019-05-03 MED ORDER — INSULIN GLARGINE 100 UNIT/ML ~~LOC~~ SOLN: 26.0000 [IU] | Freq: Every day | SUBCUTANEOUS | Status: DC

## 2019-05-03 NOTE — Telephone Encounter (Signed)
Scheduled appt per 4/2 sch message - unable to reach pt . Left message with apt date and time
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
## 2019-05-03 NOTE — Progress Notes (Addendum)
Progress Note  Patient Name: Clarence Andrews. Date of Encounter: 05/03/2019  Primary Cardiologist:  Kienan Doublin   Subjective   69 yo male,  Hx of CAD ,  Admitted with diarrhea. Found to have covid. Has remained tachycardic   Ramiel remains in atrial flutter.  He is now on Eliquis.  His heart rate is better controlled. He is currently on Eliquis 5 mg twice a day, Lanoxin 0.125 mg a day, metoprolol 100 mg twice a day.  His heart rate seems to be fairly well controlled.  He may need some diltiazem for better control if he becomes tachycardic.  He is been found to have C. difficile colitis and is now on oral vancomycin. This is day 5 of his Covid treatments.  Inpatient Medications    Scheduled Meds: . apixaban  5 mg Oral BID  . vitamin C  500 mg Oral Daily  . Chlorhexidine Gluconate Cloth  6 each Topical Daily  . cholecalciferol  1,000 Units Oral Daily  . dexamethasone  6 mg Oral Daily  . digoxin  0.125 mg Oral Daily  . ferrous sulfate  325 mg Oral Q breakfast  . insulin aspart  0-20 Units Subcutaneous TID WC  . insulin aspart  0-5 Units Subcutaneous QHS  . insulin aspart  10 Units Subcutaneous TID WC  . insulin glargine  28 Units Subcutaneous Daily  . ipratropium  2 puff Inhalation TID  . levalbuterol  2 puff Inhalation TID  . metoprolol tartrate  100 mg Oral BID  . mometasone-formoterol  2 puff Inhalation BID  . saccharomyces boulardii  250 mg Oral BID  . vancomycin  125 mg Oral QID  . vitamin B-12  500 mcg Oral Daily  . zinc sulfate  220 mg Oral Daily   Continuous Infusions: . remdesivir 100 mg in NS 100 mL Stopped (05/02/19 0945)   PRN Meds: acetaminophen **OR** acetaminophen, levalbuterol, metoprolol tartrate, oxyCODONE, sodium chloride flush   Vital Signs    Vitals:   05/02/19 1339 05/02/19 2203 05/02/19 2229 05/03/19 0458  BP: 109/74 (!) 127/94  (!) 136/99  Pulse: (!) 107 (!) 143 95 71  Resp:  15  16  Temp:  98.7 F (37.1 C)  97.7 F (36.5 C)  TempSrc:  Oral   Oral  SpO2:  98%  99%  Weight:      Height:        Intake/Output Summary (Last 24 hours) at 05/03/2019 0833 Last data filed at 05/03/2019 0458 Gross per 24 hour  Intake 480 ml  Output 3650 ml  Net -3170 ml   Filed Weights   04/29/19 0511 04/30/19 0500 05/01/19 0500  Weight: 111.5 kg 108.4 kg 110.5 kg   Last Weight  Most recent update: 05/01/2019  5:31 AM   Weight  110.5 kg (243 lb 9.7 oz)           Weight change:    Telemetry  Atrial flutter with a variable AV block.  HR around 90-100   ECG    Atrial flutter with rapid ventricular response personally Reviewed  Physical Exam   Physical Exam: Blood pressure (!) 136/99, pulse 71, temperature 97.7 F (36.5 C), temperature source Oral, resp. rate 16, height 6\' 2"  (1.88 m), weight 110.5 kg, SpO2 99 %.   Labs    Hematology Recent Labs  Lab 05/01/19 0508 05/02/19 0159 05/03/19 0440  WBC 12.0* 12.0* 19.2*  RBC 4.20* 4.08* 4.53  HGB 11.0* 10.5* 12.0*  HCT 35.0* 34.2* 38.0*  MCV 83.3 83.8 83.9  MCH 26.2 25.7* 26.5  MCHC 31.4 30.7 31.6  RDW 13.8 13.9 14.3  PLT 477* 469* 489*    Chemistry Recent Labs  Lab 04/29/19 0500 04/29/19 0500 05/01/19 0508 05/02/19 0159 05/03/19 0440  NA 135   < > 135 132* 135  K 4.5   < > 4.6 4.9 4.6  CL 100   < > 99 98 98  CO2 25   < > 29 25 28   GLUCOSE 298*   < > 300* 367* 209*  BUN 20   < > 35* 45* 44*  CREATININE 1.22   < > 1.24 1.40* 1.15  CALCIUM 8.7*   < > 9.0 8.8* 9.1  PROT 6.6  --   --  6.2* 6.7  ALBUMIN 2.6*  --   --  2.5* 2.7*  AST 25  --   --  22 28  ALT 28  --   --  23 27  ALKPHOS 88  --   --  84 97  BILITOT 0.6  --   --  0.4 0.7  GFRNONAA 60*   < > 59* 50* >60  GFRAA >60   < > >60 58* >60  ANIONGAP 10   < > 7 9 9    < > = values in this interval not displayed.     High Sensitivity Troponin:  No results for input(s): TROPONINIHS in the last 720 hours.    BNP Recent Labs  Lab 04/28/19 0134  BNP 261.4*     DDimer  Recent Labs  Lab 05/01/19 0508  05/02/19 0159 05/03/19 0440  DDIMER 0.82* 0.64* 0.47     Radiology    No results found.   Cardiac Studies   ECHO:  04/28/2019 1. Difficult to assess LV systolic function given tachycardia and poor  windows, but appears mildly to moderately reduced. Left ventricular  ejection fraction, by estimation, is 40 to 45%. Would consider repeat TTE  with contrast once heart rate better  controlled. There is moderate left ventricular hypertrophy. Left  ventricular diastolic parameters are indeterminate.  2. Right ventricle is poorly visualized, but function appears moderately  reduced  3. The mitral valve is normal in structure. No evidence of mitral valve  regurgitation.  4. The aortic valve is tricuspid. Aortic valve regurgitation is not  visualized. No aortic stenosis is present.  5. Aortic dilatation noted. 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  6. The inferior vena cava is dilated in size with <50% respiratory  variability, suggesting right atrial pressure of 15 mmHg.   Patient Profile     69 y.o. 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  Assessment & Plan    1. Atrial flutter:   Continue Eliquis 5 mg twice a day.  We will increase the metoprolol to 100 mg twice a day.  We have added digoxin 0.125 mg a day.  Heart rate seems to be improving.  Continue current medications.  We can add low-dose diltiazem if needed.   4. Chronic systolic CHF He has mildly depressed LV function.  This may be due to a chronically elevated heart rate.  We will continue with current medications and will continue to address this as an outpatient.  3. Ascending thoracic aortic aneurysm  He has a small to moderate sized ascending thoracic aortic  aneurysm.  We will repeat an echocardiogram in 6 months.  At some point we will get CT angiogram of the chest as well.  Mertie Moores, MD  05/03/2019 8:33 AM    [place],  [place], [place]  [address] Phone: [phone]; Fax: [phone]

## 2019-05-03 NOTE — Progress Notes (Signed)
Physical Therapy Treatment Patient Details Name: Clarence Andrews. MRN: [id number] DOB: 1950-04-08 Today's Date: 05/03/2019    History of Present Illness 69 year old male with history of throat, tongue cancer diagnosed more than 5 years ago, oncologist in [place], [place], CABG 3 years ago, cardiologist in [place], newly, diagnosed colorectal cancer with ongoing chemotherapy, iron deficiency anemia, diabetes mellitus type 2 presented to ED with generalized weakness. admitted with multi-focal pna d/t covid 19    PT Comments    Limited session today d/t pt incontinent of stool in the bed. Pt amb short distance in room then wanted to eat lunch. VSS. Sats.92% on RA. Pt is reliant on RW today for amb, unsteady without. May benefit from HHPT as well as RW. Continue to follow in acute setting.    Follow Up Recommendations  Supervision - Intermittent;Home health PT     Equipment Recommendations  Rolling walker with 5" wheels    Recommendations for Other Services       Precautions / Restrictions Precautions Precautions: Fall Restrictions Weight Bearing Restrictions: No    Mobility  Bed Mobility Overal bed mobility: Needs Assistance Bed Mobility: Rolling;Sidelying to Sit Rolling: Min guard Sidelying to sit: Supervision;HOB elevated       General bed mobility comments: incr time, cues for task completion  Transfers Overall transfer level: Needs assistance Equipment used: Rolling walker (2 wheeled) Transfers: Sit to/from Stand Sit to Stand: Supervision         General transfer comment: for safety, incr time, cues for hand placement. pt incontinent of stool in bed,  stoo dfor peri-care.   Ambulation/Gait Ambulation/Gait assistance: Supervision;Min guard Gait Distance (Feet): 15 Feet Assistive device: Rolling walker (2 wheeled) Gait Pattern/deviations: Step-through pattern;Decreased stride length Gait velocity: decr   General Gait Details: cues for RW position and safety,  unsteady without UE support, no LOB   Stairs             Wheelchair Mobility    Modified Rankin (Stroke Patients Only)       Balance Overall balance assessment: Needs assistance   Sitting balance-Leahy Scale: Good     Standing balance support: Single extremity supported;Bilateral upper extremity supported;During functional activity Standing balance-Leahy Scale: Fair Standing balance comment: pt reliant on UE support today for extended standing time needed for peri-care                            Cognition Arousal/Alertness: Awake/alert Behavior During Therapy: [facility] for tasks assessed/performed;Flat affect Overall Cognitive Status: Impaired/Different from baseline                         Following Commands: Follows one step commands with increased time     Problem Solving: Slow processing        Exercises      General Comments        Pertinent Vitals/Pain Pain Assessment: No/denies pain    Home Living                      Prior Function            PT Goals (current goals can now be found in the care plan section) Acute Rehab PT Goals Patient Stated Goal: home soon PT Goal Formulation: With patient Time For Goal Achievement: 05/07/19 Potential to Achieve Goals: Good Progress towards PT goals: Progressing toward goals    Frequency    Min 3X/week  PT Plan Discharge plan needs to be updated    Co-evaluation              AM-PAC PT "6 Clicks" Mobility   Outcome Measure  Help needed turning from your back to your side while in a flat bed without using bedrails?: None Help needed moving from lying on your back to sitting on the side of a flat bed without using bedrails?: A Little Help needed moving to and from a bed to a chair (including a wheelchair)?: A Little Help needed standing up from a chair using your arms (e.g., wheelchair or bedside chair)?: A Little Help needed to walk in hospital room?: A  Little Help needed climbing 3-5 steps with a railing? : A Little 6 Click Score: 19    End of Session   Activity Tolerance: Patient tolerated treatment well Patient left: in chair;with call bell/phone within reach;with chair alarm set Nurse Communication: Mobility status PT Visit Diagnosis: Difficulty in walking, not elsewhere classified (R26.2)     Time: 1240-1303 PT Time Calculation (min) (ACUTE ONLY): 23 min  Charges:  $Gait Training: 8-22 mins $Therapeutic Activity: 8-22 mins                     Baxter Flattery, PT   Acute Rehab Dept [facility]): [id number]   05/03/2019    [facility] 05/03/2019, 1:19 PM

## 2019-05-03 NOTE — Progress Notes (Signed)
Per Dr. [name]: Schedule office visit sherrill or lisa and [facility] 05/20/2019, port/labs, CBC and c-Met  Order sent to scheduling and lab orders entered.

## 2019-05-04 DIAGNOSIS — C187 Malignant neoplasm of sigmoid colon: Secondary | ICD-10-CM

## 2019-05-04 DIAGNOSIS — I5023 Acute on chronic systolic (congestive) heart failure: Secondary | ICD-10-CM

## 2019-05-04 DIAGNOSIS — I483 Typical atrial flutter: Secondary | ICD-10-CM

## 2019-05-04 DIAGNOSIS — M6281 Muscle weakness (generalized): Secondary | ICD-10-CM | POA: Diagnosis not present

## 2019-05-04 LAB — CBC WITH DIFFERENTIAL/PLATELET
Abs Immature Granulocytes: 1.03 10*3/uL — ABNORMAL HIGH (ref 0.00–0.07)
Basophils Absolute: 0.1 10*3/uL (ref 0.0–0.1)
Basophils Relative: 1 %
Eosinophils Absolute: 0 10*3/uL (ref 0.0–0.5)
Eosinophils Relative: 0 %
HCT: 38.5 % — ABNORMAL LOW (ref 39.0–52.0)
Hemoglobin: 12 g/dL — ABNORMAL LOW (ref 13.0–17.0)
Immature Granulocytes: 7 %
Lymphocytes Relative: 5 %
Lymphs Abs: 0.8 10*3/uL (ref 0.7–4.0)
MCH: 26.1 pg (ref 26.0–34.0)
MCHC: 31.2 g/dL (ref 30.0–36.0)
MCV: 83.7 fL (ref 80.0–100.0)
Monocytes Absolute: 1.4 10*3/uL — ABNORMAL HIGH (ref 0.1–1.0)
Monocytes Relative: 9 %
Neutro Abs: 12.7 10*3/uL — ABNORMAL HIGH (ref 1.7–7.7)
Neutrophils Relative %: 78 %
Platelets: 458 10*3/uL — ABNORMAL HIGH (ref 150–400)
RBC: 4.6 MIL/uL (ref 4.22–5.81)
RDW: 14.3 % (ref 11.5–15.5)
WBC: 16 10*3/uL — ABNORMAL HIGH (ref 4.0–10.5)
nRBC: 0.8 % — ABNORMAL HIGH (ref 0.0–0.2)

## 2019-05-04 LAB — FERRITIN: Ferritin: 277 ng/mL (ref 24–336)

## 2019-05-04 LAB — COMPREHENSIVE METABOLIC PANEL
ALT: 27 U/L (ref 0–44)
AST: 23 U/L (ref 15–41)
Albumin: 2.6 g/dL — ABNORMAL LOW (ref 3.5–5.0)
Alkaline Phosphatase: 100 U/L (ref 38–126)
Anion gap: 8 (ref 5–15)
BUN: 44 mg/dL — ABNORMAL HIGH (ref 8–23)
CO2: 27 mmol/L (ref 22–32)
Calcium: 8.8 mg/dL — ABNORMAL LOW (ref 8.9–10.3)
Chloride: 101 mmol/L (ref 98–111)
Creatinine, Ser: 1.04 mg/dL (ref 0.61–1.24)
GFR calc Af Amer: >60 mL/min (ref >60)
GFR calc non Af Amer: >60 mL/min (ref >60)
Glucose, Bld: 164 mg/dL — ABNORMAL HIGH (ref 70–99)
Potassium: 4.2 mmol/L (ref 3.5–5.1)
Sodium: 136 mmol/L (ref 135–145)
Total Bilirubin: 0.5 mg/dL (ref 0.3–1.2)
Total Protein: 6.5 g/dL (ref 6.5–8.1)

## 2019-05-04 LAB — GLUCOSE, CAPILLARY
Glucose-Capillary: 144 mg/dL — ABNORMAL HIGH (ref 70–99)
Glucose-Capillary: 189 mg/dL — ABNORMAL HIGH (ref 70–99)

## 2019-05-04 LAB — C-REACTIVE PROTEIN: CRP: 1.3 mg/dL — ABNORMAL HIGH (ref <1.0)

## 2019-05-04 LAB — MAGNESIUM: Magnesium: 2 mg/dL (ref 1.7–2.4)

## 2019-05-04 LAB — D-DIMER, QUANTITATIVE: D-Dimer, Quant: 0.46 ug/mL-FEU (ref 0.00–0.50)

## 2019-05-04 MED ORDER — ASCORBIC ACID 500 MG PO TABS: 500.0000 mg | ORAL_TABLET | Freq: Every day | ORAL | Status: DC

## 2019-05-04 MED ORDER — LEVALBUTEROL TARTRATE 45 MCG/ACT IN AERO: 2.0000 | INHALATION_SPRAY | Freq: Four times a day (QID) | RESPIRATORY_TRACT | 0 refills | Status: DC | PRN

## 2019-05-04 MED ORDER — APIXABAN 5 MG PO TABS: 5.0000 mg | ORAL_TABLET | Freq: Two times a day (BID) | ORAL | 1 refills | Status: DC

## 2019-05-04 MED ORDER — DIGOXIN 125 MCG PO TABS: 0.1250 mg | ORAL_TABLET | Freq: Every day | ORAL | 1 refills | Status: DC

## 2019-05-04 MED ORDER — METOPROLOL TARTRATE 100 MG PO TABS: 100.0000 mg | ORAL_TABLET | Freq: Two times a day (BID) | ORAL | 1 refills | Status: DC

## 2019-05-04 MED ORDER — HEPARIN SOD (PORK) LOCK FLUSH 100 UNIT/ML IV SOLN
500.0000 [IU] | INTRAVENOUS | Status: AC | PRN
  Administered 2019-05-04: 500 [IU]

## 2019-05-04 MED ORDER — SACCHAROMYCES BOULARDII 250 MG PO CAPS: 250.0000 mg | ORAL_CAPSULE | Freq: Two times a day (BID) | ORAL | 0 refills | Status: AC

## 2019-05-04 MED ORDER — LEVALBUTEROL TARTRATE 45 MCG/ACT IN AERO: 2.0000 | INHALATION_SPRAY | Freq: Four times a day (QID) | RESPIRATORY_TRACT | Status: DC | PRN

## 2019-05-04 NOTE — Discharge Summary (Signed)
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           BP:           123/94 mmHg Patient Gender: M                  HR:           142 bpm. Exam Location:  Inpatient Procedure: 2D Echo Indications:    abnormal ECG  History:  Patient has no prior history of Echocardiogram examinations.                 CHF, Prior CABG; Risk Factors:Diabetes. 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  Sonographer:    Jannett Celestine RDCS (AE) Referring Phys: [id number] [place]  Sonographer Comments: elevated HR (see comments) IMPRESSIONS  1. Difficult to assess LV systolic function given tachycardia and poor windows, but appears mildly to moderately reduced. Left ventricular ejection fraction, by estimation, is 40 to 45%. Would consider repeat TTE with contrast once heart rate better controlled. There is moderate left ventricular  hypertrophy. Left ventricular diastolic parameters are indeterminate.  2. Right ventricle is poorly visualized, but function appears moderately reduced  3. The mitral valve is normal in structure. No evidence of mitral valve regurgitation.  4. The aortic valve is tricuspid. Aortic valve regurgitation is not visualized. No aortic stenosis is present.  5. Aortic dilatation noted. 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  6. The inferior vena cava is dilated in size with <50% respiratory variability, suggesting right atrial pressure of 15 mmHg. FINDINGS  Left Ventricle: Left ventricular ejection fraction, by estimation, is 40 to 45%. The left ventricle has mildly decreased function. The left ventricle demonstrates global hypokinesis. The left ventricular internal cavity size was small. There is moderate  left ventricular hypertrophy. Left ventricular diastolic parameters are indeterminate. Right Ventricle: The right ventricular size is not well visualized. Right vetricular wall thickness was not assessed. Right ventricular systolic function is moderately reduced. Left Atrium: Left atrial size was not well visualized. Right Atrium: Right atrial size was not well visualized. Pericardium: Trivial pericardial effusion is present. Mitral Valve: The mitral valve is normal in structure. No evidence of mitral valve regurgitation. Tricuspid Valve: The tricuspid valve is normal in structure. Tricuspid valve regurgitation is trivial. Aortic Valve: The aortic valve is tricuspid. Aortic valve regurgitation is not visualized. No aortic stenosis is present. Pulmonic Valve: The pulmonic valve was not well visualized. Pulmonic valve regurgitation is not visualized. Aorta: Aortic dilatation noted. 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 Venous: The inferior vena cava is dilated in size with less than 50% respiratory variability, suggesting right atrial pressure of 15 mmHg. IAS/Shunts: The  interatrial septum was not well visualized.  LEFT VENTRICLE PLAX 2D LVIDd:         3.70 cm LVIDs:         2.70 cm LV PW:         1.40 cm LV IVS:        1.20 cm  LEFT ATRIUM         Index LA diam:    4.00 cm 1.70 cm/m  AORTIC VALVE LVOT Vmax:   66.60 cm/s LVOT Vmean:  44.200 cm/s LVOT VTI:    0.130 m  AORTA Ao Root diam: 3.50 cm  SHUNTS Systemic VTI: 0.13 m Oswaldo Milian MD Electronically signed by Oswaldo Milian MD Signature Date/Time: 04/28/2019/2:51:27 PM    Final    IR IMAGING GUIDED PORT INSERTION  Result Date: 04/04/2019 INDICATION: 69 year old with colon cancer and liver metastasis. Port-A-Cath needed for chemotherapy. EXAM: FLUOROSCOPIC AND ULTRASOUND GUIDED PLACEMENT OF A SUBCUTANEOUS PORT COMPARISON:  None. MEDICATIONS: Ancef 2 g; The antibiotic was administered within an appropriate time interval prior to skin puncture. ANESTHESIA/SEDATION: Fentanyl 50 mcg FLUOROSCOPY TIME:  12 seconds, 4 mGy  COMPLICATIONS: None immediate. PROCEDURE: The procedure, risks, benefits, and alternatives were explained to the patient. Questions regarding the procedure were encouraged and answered. The patient understands and consents to the procedure. Patient was placed supine on the interventional table. Ultrasound confirmed a patent right internal jugular vein. Ultrasound image was saved for documentation. The right chest and neck were cleaned with a skin antiseptic and a sterile drape was placed. Maximal barrier sterile technique was utilized including caps, mask, sterile gowns, sterile gloves, sterile drape, hand hygiene and skin antiseptic. The right neck was anesthetized with 1% lidocaine. Small incision was made in the right neck with a blade. Micropuncture set was placed in the right internal jugular vein with ultrasound guidance. The micropuncture wire was used for measurement purposes. The right chest was anesthetized with 1% lidocaine with epinephrine. #15 blade was used to make an incision and a  subcutaneous port pocket was formed. Mitchell was assembled. Subcutaneous tunnel was formed with a stiff tunneling device. The port catheter was brought through the subcutaneous tunnel. The port was placed in the subcutaneous pocket. The micropuncture set was exchanged for a peel-away sheath. The catheter was placed through the peel-away sheath and the tip was positioned at the SVC and right atrium junction. Catheter placement was confirmed with fluoroscopy. The port was accessed and flushed with heparinized saline. The port pocket was closed using two layers of absorbable sutures and Dermabond. The vein skin site was closed using a single layer of absorbable suture and Dermabond. Sterile dressings were applied. Patient tolerated the procedure well without an immediate complication. Ultrasound and fluoroscopic images were taken and saved for this procedure. IMPRESSION: Placement of a subcutaneous port device. Catheter tip at the SVC and right atrium junction. Electronically Signed   By: Markus Daft M.D.   On: 04/04/2019 15:10    Microbiology: Recent Results (from the past 240 hour(s))  Urine culture     Status: Abnormal   Collection Time: 04/28/19  1:34 AM   Specimen: Urine, Clean Catch  Result Value Ref Range Status   Specimen Description   Final    URINE, CLEAN CATCH Performed at [facility], [place] [address] [place], [place] [address]    Special Requests   Final    NONE Performed at [facility], [place] [address] [place], [place] [address]    Culture MULTIPLE SPECIES PRESENT, SUGGEST RECOLLECTION (A)  Final   Report Status 04/29/2019 FINAL  Final  Blood culture (routine x 2)     Status: None   Collection Time: 04/28/19  2:14 AM   Specimen: BLOOD LEFT HAND  Result Value Ref Range Status   Specimen Description   Final    BLOOD LEFT HAND Performed at [place] [address] [place], [place] [address]    Special  Requests   Final    BOTTLES DRAWN AEROBIC AND ANAEROBIC Blood Culture adequate volume Performed at [place] [address] [place], [place] [address]    Culture   Final    NO GROWTH 5 DAYS Performed at [facility], [place] [address] [place], [place] [address]    Report Status 05/03/2019 FINAL  Final  Blood culture (routine x 2)     Status: None   Collection Time: 04/28/19  2:14 AM   Specimen: BLOOD  Result Value Ref Range Status   Specimen Description   Final    BLOOD PORTA CATH Performed at [facility], [place] [address] [place], [place] [address]    Special Requests   Final    BOTTLES DRAWN AEROBIC AND ANAEROBIC Blood Culture adequate volume Performed at [place] [address], [place], [place] [address]    Culture   Final    NO GROWTH 5 DAYS Performed at [facility], [place] [address], [place], [place] [address]    Report Status 05/03/2019 FINAL  Final  SARS CORONAVIRUS 2 (TAT 6-24 HRS) Nasopharyngeal Nasopharyngeal Swab     Status: Abnormal   Collection Time: 04/28/19  5:10 AM   Specimen: Nasopharyngeal Swab  Result Value Ref Range Status   SARS Coronavirus 2 POSITIVE (A) NEGATIVE Final    Comment: RESULT CALLED TO, READ BACK BY AND VERIFIED WITH: AMANDA VALBU RN.@1715  ON 3.28.21 BY TCALDWELL MT. (NOTE) SARS-CoV-2 target nucleic acids are DETECTED. The SARS-CoV-2 RNA is generally detectable in upper and lower respiratory specimens during the acute phase of infection. Positive results are indicative of the presence of SARS-CoV-2 RNA. Clinical correlation with patient history and other diagnostic information is  necessary to determine patient infection status. Positive results do not rule out bacterial infection or co-infection with other viruses.  The expected result is Negative. Fact Sheet for Patients: SugarRoll.be Fact Sheet for Healthcare  Providers: https://www.woods-mathews.com/ This test is not yet approved or cleared by the [place] FDA and  has been authorized for detection and/or diagnosis of SARS-CoV-2 by FDA under an Emergency Use Authorization (EUA). This EUA will remain  in effect (meaning this test can be  used) for the duration of the COVID-19 declaration under Section 564(b)(1) of the Act, 21 U.S.C. section 360bbb-3(b)(1), unless the authorization is terminated or revoked sooner. Performed at [facility], [place] [address], [place], [place] [address]   C Difficile Quick Screen w PCR reflex     Status: Abnormal   Collection Time: 04/28/19  8:18 AM   Specimen: STOOL  Result Value Ref Range Status   C Diff antigen POSITIVE (A) NEGATIVE Final   C Diff toxin NEGATIVE NEGATIVE Final   C Diff interpretation Results are indeterminate. See PCR results.  Final    Comment: Performed at [facility], [place] [address] [place], [place] [address]  C. Diff by PCR, Reflexed     Status: Abnormal   Collection Time: 04/28/19  8:18 AM  Result Value Ref Range Status   Toxigenic C. Difficile by PCR POSITIVE (A) NEGATIVE Final    Comment: Positive for toxigenic C. difficile with little to no toxin production. Only treat if clinical presentation suggests symptomatic illness. Performed at [facility], [place] [address], [place], [place] [address]   GI pathogen panel by PCR, stool     Status: None   Collection Time: 04/28/19  8:37 AM   Specimen: Stool  Result Value Ref Range Status   Plesiomonas shigelloides NOT DETECTED NOT DETECTED Final   Yersinia enterocolitica NOT DETECTED NOT DETECTED Final   Vibrio NOT DETECTED NOT DETECTED Final   Enteropathogenic E coli NOT DETECTED NOT DETECTED Final   E coli (ETEC) LT/ST NOT DETECTED NOT DETECTED Final   E coli [id number] by PCR Not applicable NOT DETECTED Final   Cryptosporidium by PCR NOT DETECTED NOT DETECTED Final   Entamoeba histolytica NOT  DETECTED NOT DETECTED Final   Adenovirus F 40/41 NOT DETECTED NOT DETECTED Final   Norovirus GI/GII NOT DETECTED NOT DETECTED Final   Sapovirus NOT DETECTED NOT DETECTED  Final    Comment: (NOTE) Performed At: [facility] [place], [place] [id number] Rush Farmer MD [id number]    Vibrio cholerae NOT DETECTED NOT DETECTED Final   Campylobacter by PCR NOT DETECTED NOT DETECTED Final   Salmonella by PCR NOT DETECTED NOT DETECTED Final   E coli (STEC) NOT DETECTED NOT DETECTED Final   Enteroaggregative E coli NOT DETECTED NOT DETECTED Final   Shigella by PCR NOT DETECTED NOT DETECTED Final   Cyclospora cayetanensis NOT DETECTED NOT DETECTED Final   Astrovirus NOT DETECTED NOT DETECTED Final   G lamblia by PCR NOT DETECTED NOT DETECTED Final   Rotavirus A by PCR NOT DETECTED NOT DETECTED Final  MRSA PCR Screening     Status: None   Collection Time: 04/29/19  5:00 AM   Specimen: Nasal Mucosa; Nasopharyngeal  Result Value Ref Range Status   MRSA by PCR NEGATIVE NEGATIVE Final    Comment:        The GeneXpert MRSA Assay (FDA approved for NASAL specimens only), is one component of a comprehensive MRSA colonization surveillance program. It is not intended to diagnose MRSA infection nor to guide or monitor treatment for MRSA infections. Performed at [facility], [place] [address], [place], [place] [address]      Labs: Basic Metabolic Panel: Recent Labs  Lab 04/29/19 0500 05/01/19 0508 05/02/19 0159 05/03/19 0440 05/04/19 0422  NA 135 135 132* 135 136  K 4.5 4.6 4.9 4.6 4.2  CL 100 99 98 98 101  CO2 25 29 25 28 27   GLUCOSE 298* 300* 367* 209* 164*  BUN 20 35* 45* 44* 44*  CREATININE 1.22 1.24 1.40* 1.15 1.04  CALCIUM 8.7* 9.0 8.8* 9.1 8.8*  MG 1.3*  --  1.7 2.7* 2.0  PHOS  --   --  2.6  --   --    Liver Function Tests: Recent Labs  Lab 04/28/19 0134 04/29/19 0500 05/02/19 0159 05/03/19 0440 05/04/19 0422  AST 33 25 22  28 23   ALT 35 28 23 27 27   ALKPHOS 96 88 84 97 100  BILITOT 0.8 0.6 0.4 0.7 0.5  PROT 7.2 6.6 6.2* 6.7 6.5  ALBUMIN 2.7* 2.6* 2.5* 2.7* 2.6*   No results for input(s): LIPASE, AMYLASE in the last 168 hours. No results for input(s): AMMONIA in the last 168 hours. CBC: Recent Labs  Lab 04/30/19 0850 05/01/19 0508 05/02/19 0159 05/03/19 0440 05/04/19 0422  WBC 10.1 12.0* 12.0* 19.2* 16.0*  NEUTROABS 7.8* 9.1* 9.6* 15.5* 12.7*  HGB 10.7* 11.0* 10.5* 12.0* 12.0*  HCT 34.0* 35.0* 34.2* 38.0* 38.5*  MCV 83.1 83.3 83.8 83.9 83.7  PLT 451* 477* 469* 489* 458*   Cardiac Enzymes: No results for input(s): CKTOTAL, CKMB, CKMBINDEX, TROPONINI in the last 168 hours. BNP: BNP (last 3 results) Recent Labs    04/28/19 0134  BNP 261.4*    ProBNP (last 3 results) No results for input(s): PROBNP in the last 8760 hours.  CBG: Recent Labs  Lab 05/03/19 1236 05/03/19 1746 05/03/19 2125 05/04/19 0754 05/04/19 1139  GLUCAP 250* 259* 317* 144* 189*       Signed:  Irine Seal MD.  [facility] 05/04/2019, 2:26 PM

## 2019-05-04 NOTE — Progress Notes (Signed)
Chart reviewed.  Patient being treated for Covid pneumonia, C. difficile colitis and is undergoing chemotherapy for colon cancer that is metastatic.  He is being treated for atrial flutter from a cardiology standpoint.  I have reviewed his telemetry and his heart rate is now controlled.  Would continue apixaban 5 mg twice daily, present dose of metoprolol and digoxin.  Once he recovers from present illness we will plan follow-up with cardiology and arrange outpatient cardioversion.  We will see again Monday.  Please call over the weekend with questions. Kirk Ruths

## 2019-05-04 NOTE — Progress Notes (Signed)
Patient discharged home as ordered. Rolling walker and BSC delivered prior to D/C. Pt able to ambulate around his room with some weakness, but could steady himself. Pt drove himself home in vehicle parked at ED entrance. RN attempted to call pt's son, but number in chart was not functional. D/C paperwork explained to pt who verbalized understanding. Pt assisted to his vehicle by W/C with assist from nursing staff. Belongings and equipment loaded in to vehicle by staff.

## 2019-05-04 NOTE — TOC Progression Note (Signed)
Transition of Care (TOC) - Progression Note    Patient Details  Name: Chelsey Alix. MRN: [id number] Date of Birth: 11/26/1950  Transition of Care [facility]) CM/SW Contact  Joaquin Courts, RN Phone Number: 05/04/2019, 2:10 PM  Clinical Narrative:   [facility] to provide [facility] services. [employer] to deliver rolling walker and 3in1 to bedside.    Expected Discharge Plan: [place] Barriers to Discharge: No Barriers Identified  Expected Discharge Plan and Services Expected Discharge Plan: [place]   Discharge Planning Services: CM Consult Post Acute Care Choice: [place] arrangements for the past 2 months: Single Family Home Expected Discharge Date: 05/04/19               DME Arranged: Tub bench, Walker rolling DME Agency: [employer] Date DME Agency Contacted: 05/04/19 Time DME Agency Contacted: [phone] Representative spoke with at DME Agency: [place]: PT, OT [place] Agency: [facility] [employer] Date [place]: 05/04/19 Time [place]: 1410 Representative spoke with at [place]: [place] ([place]) Interventions    Readmission Risk Interventions No flowsheet data found.

## 2019-05-07 ENCOUNTER — Other Ambulatory Visit: Payer: Self-pay | Admitting: *Deleted

## 2019-05-07 NOTE — Patient Outreach (Addendum)
[facility]) Care Management  05/07/2019  Clarence Andrews. [birth year] [id number]   EMMI General Discharge Red Flag Alert    Day : #1 Date: 05/06/19 Red Alert Reason  Who reached Patient  Questions about discharge papers? Yes  Know who to call about changes in condition? No  Other questions/problems? Yes    Subjective: Outreach attempt #1 Outreach  call to patient unsuccessful no answer able to leave a HIPAA compliant message for return call.  Plan Will send [facility] unsuccessful outreach letter and plan return call within the next 4 business days.  Update  1600 Received incoming call from patient explained reason for the call and HIPAA information verified x 2 identifiers. Patient discussed recent hospital admission at [facility] states that this is not a good time to talk and requested that I return call on tomorrow.   Joylene Draft, RN, BSN  [place] Management Coordinator  [phone]- Mobile [phone]- Toll Free Main Office

## 2019-05-08 ENCOUNTER — Other Ambulatory Visit: Payer: Self-pay | Admitting: *Deleted
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
## 2019-05-09 ENCOUNTER — Ambulatory Visit: Payer: Medicare HMO | Admitting: *Deleted

## 2019-05-12 ENCOUNTER — Other Ambulatory Visit: Payer: Self-pay | Admitting: Oncology

## 2019-05-12 DIAGNOSIS — C187 Malignant neoplasm of sigmoid colon: Secondary | ICD-10-CM | POA: Diagnosis not present

## 2019-05-14 NOTE — Progress Notes (Signed)
Pharmacist Chemotherapy Monitoring - Follow Up Assessment    I verify that I have reviewed each item in the below checklist:  . Regimen for the patient is scheduled for the appropriate day and plan matches scheduled date. [place] Kitchen Appropriate non-routine labs are ordered dependent on drug ordered. . If applicable, additional medications reviewed and ordered per protocol based on lifetime cumulative doses and/or treatment regimen.   Plan for follow-up and/or issues identified: No . I-vent associated with next due treatment: Yes . MD and/or nursing notified: No  Adelina Mings 05/14/2019 8:54 AM

## 2019-05-15 ENCOUNTER — Other Ambulatory Visit: Payer: Self-pay | Admitting: *Deleted

## 2019-05-15 NOTE — Patient Outreach (Signed)
[facility]) Care Management  05/15/2019  Avontae Bruni. Sep 30, 1950 [id number]   EMMI Red Flag Discharge Red Flag Follow up call    Day : #1 Date: 05/06/19 Red Alert Reason : Questions about discharge papers ? - yes, Know who to call about changes in condition: No   Subjective  Unsuccessful outreach call for follow up on EMMI red flag alert, after initial outreach and question addressed.    Plan If no return call on today will plan return call in the next 4 business days.    Joylene Draft, RN, BSN  [place] Management Coordinator  [phone]- Mobile [phone]- Toll Free Main Office

## 2019-05-16 ENCOUNTER — Telehealth: Payer: Self-pay | Admitting: *Deleted

## 2019-05-16 NOTE — Telephone Encounter (Signed)
Left message requesting call to tell him the time of his appointment on 05/20/19. Also reports having more right knee pain due to frequently getting up and down due to diarrhea. The rolling walker he has now is too large to negotiate in his house trailer. Asking about getting one with no wheels possibly.

## 2019-05-16 NOTE — Telephone Encounter (Signed)
Patient left VM asking what time his appointment on 05/20/19 is? Also having trouble walking around due to knee pain from sitting/standing so much from diarrhea. The walker his has with wheels is too wide for his trailer to use. Asking for one without wheels. Attempted to return call and there was no answer or voice mail.

## 2019-05-17 NOTE — Telephone Encounter (Signed)
Reports the walker w/wheels can't get through the walkway in trailer very well. Wants a standard folding walker without wheels--will order this on 05/20/19 at appointment time to provide documentation in note for insurance. Asking what meds he needs to be taking? Instructed him to bring all his meds to the appointment on Monday and we will decide what is important and what is not. He also is requesting all his meds be ordered through [facility] for mail delivery. Reports  continued diarrhea, but not taking anything. He has the Equate anti-diarrhea med at home. Encouraged him to take #2 qid prn till diarrhea improves.

## 2019-05-20 ENCOUNTER — Other Ambulatory Visit: Payer: Self-pay

## 2019-05-20 ENCOUNTER — Telehealth: Payer: Self-pay | Admitting: Internal Medicine

## 2019-05-20 ENCOUNTER — Other Ambulatory Visit: Payer: Self-pay | Admitting: Oncology

## 2019-05-20 ENCOUNTER — Inpatient Hospital Stay: Payer: Medicare HMO

## 2019-05-20 ENCOUNTER — Encounter: Payer: Self-pay | Admitting: *Deleted

## 2019-05-20 ENCOUNTER — Inpatient Hospital Stay: Payer: Medicare HMO | Attending: Oncology

## 2019-05-20 ENCOUNTER — Other Ambulatory Visit: Payer: Self-pay | Admitting: *Deleted

## 2019-05-20 ENCOUNTER — Inpatient Hospital Stay (HOSPITAL_BASED_OUTPATIENT_CLINIC_OR_DEPARTMENT_OTHER): Payer: Medicare HMO | Admitting: Oncology

## 2019-05-20 VITALS — BP 120/88 | HR 125 | Temp 99.1°F | Resp 17 | Ht 74.0 in | Wt 239.7 lb

## 2019-05-20 DIAGNOSIS — Z5111 Encounter for antineoplastic chemotherapy: Secondary | ICD-10-CM | POA: Insufficient documentation

## 2019-05-20 DIAGNOSIS — C186 Malignant neoplasm of descending colon: Secondary | ICD-10-CM

## 2019-05-20 DIAGNOSIS — I2510 Atherosclerotic heart disease of native coronary artery without angina pectoris: Secondary | ICD-10-CM | POA: Insufficient documentation

## 2019-05-20 DIAGNOSIS — Z951 Presence of aortocoronary bypass graft: Secondary | ICD-10-CM | POA: Diagnosis not present

## 2019-05-20 DIAGNOSIS — Z85810 Personal history of malignant neoplasm of tongue: Secondary | ICD-10-CM | POA: Insufficient documentation

## 2019-05-20 DIAGNOSIS — C187 Malignant neoplasm of sigmoid colon: Secondary | ICD-10-CM

## 2019-05-20 DIAGNOSIS — R6889 Other general symptoms and signs: Secondary | ICD-10-CM | POA: Diagnosis not present

## 2019-05-20 DIAGNOSIS — Z9221 Personal history of antineoplastic chemotherapy: Secondary | ICD-10-CM | POA: Insufficient documentation

## 2019-05-20 DIAGNOSIS — C787 Secondary malignant neoplasm of liver and intrahepatic bile duct: Secondary | ICD-10-CM | POA: Insufficient documentation

## 2019-05-20 DIAGNOSIS — E119 Type 2 diabetes mellitus without complications: Secondary | ICD-10-CM | POA: Insufficient documentation

## 2019-05-20 DIAGNOSIS — C19 Malignant neoplasm of rectosigmoid junction: Secondary | ICD-10-CM | POA: Diagnosis not present

## 2019-05-20 DIAGNOSIS — Z79899 Other long term (current) drug therapy: Secondary | ICD-10-CM | POA: Diagnosis not present

## 2019-05-20 DIAGNOSIS — I4892 Unspecified atrial flutter: Secondary | ICD-10-CM | POA: Insufficient documentation

## 2019-05-20 DIAGNOSIS — Z8616 Personal history of COVID-19: Secondary | ICD-10-CM | POA: Insufficient documentation

## 2019-05-20 LAB — CMP (CANCER CENTER ONLY)
ALT: 33 U/L (ref 0–44)
AST: 31 U/L (ref 15–41)
Albumin: 2.3 g/dL — ABNORMAL LOW (ref 3.5–5.0)
Alkaline Phosphatase: 127 U/L — ABNORMAL HIGH (ref 38–126)
Anion gap: 10 (ref 5–15)
BUN: 18 mg/dL (ref 8–23)
CO2: 24 mmol/L (ref 22–32)
Calcium: 8.5 mg/dL — ABNORMAL LOW (ref 8.9–10.3)
Chloride: 103 mmol/L (ref 98–111)
Creatinine: 1.1 mg/dL (ref 0.61–1.24)
GFR, Est AFR Am: >60 mL/min (ref >60)
GFR, Estimated: >60 mL/min (ref >60)
Glucose, Bld: 144 mg/dL — ABNORMAL HIGH (ref 70–99)
Potassium: 4.3 mmol/L (ref 3.5–5.1)
Sodium: 137 mmol/L (ref 135–145)
Total Bilirubin: 0.7 mg/dL (ref 0.3–1.2)
Total Protein: 6.8 g/dL (ref 6.5–8.1)

## 2019-05-20 LAB — CBC WITH DIFFERENTIAL (CANCER CENTER ONLY)
Abs Immature Granulocytes: 0.02 10*3/uL (ref 0.00–0.07)
Basophils Absolute: 0 10*3/uL (ref 0.0–0.1)
Basophils Relative: 1 %
Eosinophils Absolute: 0.5 10*3/uL (ref 0.0–0.5)
Eosinophils Relative: 9 %
HCT: 28.9 % — ABNORMAL LOW (ref 39.0–52.0)
Hemoglobin: 9 g/dL — ABNORMAL LOW (ref 13.0–17.0)
Immature Granulocytes: 0 %
Lymphocytes Relative: 10 %
Lymphs Abs: 0.6 10*3/uL — ABNORMAL LOW (ref 0.7–4.0)
MCH: 26.9 pg (ref 26.0–34.0)
MCHC: 31.1 g/dL (ref 30.0–36.0)
MCV: 86.3 fL (ref 80.0–100.0)
Monocytes Absolute: 0.5 10*3/uL (ref 0.1–1.0)
Monocytes Relative: 9 %
Neutro Abs: 4.5 10*3/uL (ref 1.7–7.7)
Neutrophils Relative %: 71 %
Platelet Count: 212 10*3/uL (ref 150–400)
RBC: 3.35 MIL/uL — ABNORMAL LOW (ref 4.22–5.81)
RDW: 16.2 % — ABNORMAL HIGH (ref 11.5–15.5)
WBC Count: 6.3 10*3/uL (ref 4.0–10.5)
nRBC: 0 % (ref 0.0–0.2)

## 2019-05-20 LAB — MAGNESIUM: Magnesium: 1.3 mg/dL — CL (ref 1.7–2.4)

## 2019-05-20 MED ORDER — HEPARIN SOD (PORK) LOCK FLUSH 100 UNIT/ML IV SOLN
500.0000 [IU] | Freq: Once | INTRAVENOUS | Status: AC
  Administered 2019-05-20: 500 [IU] via INTRAVENOUS
  Filled 2019-05-20: qty 5

## 2019-05-20 MED ORDER — SODIUM CHLORIDE 0.9% FLUSH
10.0000 mL | INTRAVENOUS | Status: DC | PRN
  Administered 2019-05-20: 10 mL via INTRAVENOUS
  Filled 2019-05-20: qty 10

## 2019-05-20 MED ORDER — DILTIAZEM HCL ER COATED BEADS 120 MG PO CP24: 120.0000 mg | ORAL_CAPSULE | Freq: Every day | ORAL | 0 refills | Status: DC

## 2019-05-20 MED ORDER — SODIUM CHLORIDE 0.9 % IV SOLN
Freq: Once | INTRAVENOUS | Status: AC
  Filled 2019-05-20: qty 1000

## 2019-05-20 MED ORDER — VANCOMYCIN HCL 125 MG PO CAPS: ORAL_CAPSULE | ORAL | 0 refills | Status: DC

## 2019-05-20 NOTE — Progress Notes (Signed)
12:30--apical pulse at 120 with several episodes of skipped beats.
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
## 2019-05-20 NOTE — Progress Notes (Addendum)
[place] OFFICE PROGRESS NOTE   Diagnosis: Colon cancer  INTERVAL HISTORY:   [name] was last seen at the cancer center on 04/25/2019.  He completed 1 cycle of FOLFOX on 04/11/2019.  He developed diarrhea after beginning chemotherapy.  He had neutropenia when he was seen on 04/25/2019.  [name] was admitted on 04/28/2019 with persistent diarrhea and pneumonia.  He was diagnosed with COVID-19 infection.  He was treated with supportive care and Decadron.  He was also diagnosed with C. difficile colitis and completed 10 days of vancomycin.  [name] had atrial flutter in the hospital and was treated with a Cardizem drip and converted to metoprolol on discharge.  He began Eliquis anticoagulation.  He is scheduled for outpatient follow-up with cardiology.  [name] reports a good appetite and adequate fluid/food intake.  He continues to have frequent loose stool.  He was discharged in the hospital 05/04/2019.  He has intermittent rectal bleeding.  Objective:  Vital signs in last 24 hours:  Blood pressure 120/88, pulse (!) 125, temperature 99.1 F (37.3 C), temperature source Temporal, resp. rate 17, height 6' 2"  (1.88 m), SpO2 100 %.    HEENT: No thrush or ulcers Resp: Lungs clear bilaterally Cardio: Regular, tachycardia GI: Nontender, no hepatosplenomegaly Vascular: Stasis change with trace edema at the lower leg bilaterally     Portacath/PICC-without erythema  Lab Results:  Lab Results  Component Value Date   WBC 6.3 05/20/2019   HGB 9.0 (L) 05/20/2019   HCT 28.9 (L) 05/20/2019   MCV 86.3 05/20/2019   PLT 212 05/20/2019   NEUTROABS 4.5 05/20/2019    CMP  Lab Results  Component Value Date   NA 137 05/20/2019   K 4.3 05/20/2019   CL 103 05/20/2019   CO2 24 05/20/2019   GLUCOSE 144 (H) 05/20/2019   BUN 18 05/20/2019   CREATININE 1.10 05/20/2019   CALCIUM 8.5 (L) 05/20/2019   PROT 6.8 05/20/2019   ALBUMIN 2.3 (L) 05/20/2019   AST 31  05/20/2019   ALT 33 05/20/2019   ALKPHOS 127 (H) 05/20/2019   BILITOT 0.7 05/20/2019   GFRNONAA >60 05/20/2019   GFRAA >60 05/20/2019    Lab Results  Component Value Date   CEA1 4.74 04/11/2019     Medications: I have reviewed the patient's current medications.   Assessment/Plan: 1. 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 Colonoscopy 03/07/2019-nearly completely obstructing mass beginning at 16 cm from the anal verge, sigmoid colon polyp ? CTs 03/15/2019-wall thickening of the lower sigmoid colon, low rectal mass with significant luminal narrowing, diffuse liver metastases, borderline enlarged sigmoid mesocolon, iliac, and upper abdominal lymph nodes ? Elevated CEA ? Biopsy liver lesion 03/29/2019-metastatic adenocarcinoma to liver consistent with clinical impression of colorectal primary.  MSS, tumor mutation burden-3, K-ras G12D, PIK3CA mutations 2. 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 Diabetes 4. Coronary artery disease, status post coronary artery bypass surgery in 2017 5. Neutropenia following cycle 1 FOLFOX 6. Admission 04/28/2019 with COVID-19 infection 7. C. difficile colitis 04/28/2019 8. Atrial flutter during hospital admission March/April 2021-treated with a Cardizem drip, digoxin, and Eliquis anticoagulation.  Converted to metoprolol at discharge.      Disposition: [name] has metastatic colon cancer.  He completed 1 cycle of FOLFOX.  He then developed severe neutropenia, COVID-19 infection, C. difficile colitis, and atrial flutter. The white count has recovered, but  he has persistent diarrhea/bleeding.  He also appears to have rapid atrial flutter today.  Cycle 2 FOLFOX will be held today.  He will receive intravenous fluids and magnesium supplementation.  I will contact cardiology regarding management of the atrial  flutter.  The frequent stool output may be related to persistent infection with C. difficile or the rectal/sigmoid tumor.  [name] will be scheduled for an office visit and cycle 2 FOLFOX next week.  He needs a standard lightweight folding walker for ambulation.  Betsy Coder, MD  05/20/2019  12:36 PM I discussed the case with Dr. Harrington Challenger.  She agrees with holding anticoagulation therapy in the setting of rectal bleeding, a rectal tumor, and anemia.  Eliquis will be discontinued.  Cardizem CD will be added for rate control.  He has scheduled follow-up in the cardiology office later this week.

## 2019-05-20 NOTE — Progress Notes (Signed)
IVF with magnesium rate increased per Cristy Friedlander, RN per MD [name].

## 2019-05-20 NOTE — Patient Instructions (Signed)

## 2019-05-20 NOTE — Progress Notes (Signed)
Per Dr. [name]: May increase IVF rate to 350 cc/hr.

## 2019-05-20 NOTE — Progress Notes (Signed)
Faxed order for standard lightweight folding walker to [facility] w/demographics, insurance info and office note from today. # [phone]

## 2019-05-20 NOTE — Progress Notes (Signed)
CRITICAL VALUE STICKER  CRITICAL VALUE: Mg+ 1.3  RECEIVER (on-site recipient of call): Manuela Schwartz, [place] NOTIFIED: 05/20/19 @ 1207  MESSENGER (representative from lab): Lurlean Horns, RN  MD NOTIFIED: DR. [name]  TIME OF NOTIFICATION:1208  RESPONSE: IV Mg+ today

## 2019-05-20 NOTE — Patient Outreach (Signed)
[facility]) Care Management  05/20/2019  Clarence Andrews. Feb 07, 1950 [id number]    EMMI Red Flag Discharge Red Flag Follow up call    Day : #1 Date: 05/06/19 Red Alert Reason : Questions about discharge papers ? - yes, Know who to call about changes in condition: No   Subjective : Outreach call to patient, he states that he is currently still at oncology center with his chemotherapy. He request to call him back in a few days.    Plan Will plan return call in the next 4 business days to follow up on EMMI red flag.     Joylene Draft, RN, BSN  [place] Management Coordinator  [phone]- Mobile [phone]- Toll Free Main Office

## 2019-05-20 NOTE — Progress Notes (Signed)
Received critical value of Magnesium 1.3 from lab.  Otho Bellows, RN at Dr. Gearldine Shown desk to let her know the value. Gardiner Rhyme, RN

## 2019-05-20 NOTE — Patient Instructions (Addendum)
Per Cardiology: Stop your Eliquis and start Cardizem CD 120 mg daily to get your heart rate down.  New Medication: Vancomycin 125 mg capsule. Take #1 four times daily x 10 days, then Take #1 twice daily x 7 days, then Take #1 daily x 7 days, then Take #1 every 3 days x 4 weeks, then stop.  Hypomagnesemia Hypomagnesemia is a condition in which the level of magnesium in the blood is low. Magnesium is a mineral that is found in many foods. It is used in many different processes in the body. Hypomagnesemia can affect every organ in the body. In severe cases, it can cause life-threatening problems. What are the causes? This condition may be caused by:  Not getting enough magnesium in your diet.  Malnutrition.  Problems with absorbing magnesium from the intestines.  Dehydration.  Alcohol abuse.  Vomiting.  Severe or chronic diarrhea.  Some medicines, including medicines that make you urinate more (diuretics).  Certain diseases, such as kidney disease, diabetes, celiac disease, and overactive thyroid. What are the signs or symptoms? Symptoms of this condition include:  Loss of appetite.  Nausea and vomiting.  Involuntary shaking or trembling of a body part (tremor).  Muscle weakness.  Tingling in the arms and legs.  Sudden tightening of muscles (muscle spasms).  Confusion.  Psychiatric issues, such as depression, irritability, or psychosis.  A feeling of fluttering of the heart.  Seizures. These symptoms are more severe if magnesium levels drop suddenly. How is this diagnosed? This condition may be diagnosed based on:  Your symptoms and medical history.  A physical exam.  Blood and urine tests. How is this treated? Treatment depends on the cause and the severity of the condition. It may be treated with:  A magnesium supplement. This can be taken in pill form. If the condition is severe, magnesium is usually given through an IV.  Changes to your diet. You may  be directed to eat foods that have a lot of magnesium, such as green leafy vegetables, peas, beans, and nuts.  Stopping any intake of alcohol. Follow these instructions at home:      Make sure that your diet includes foods with magnesium. Foods that have a lot of magnesium in them include: ? Green leafy vegetables, such as spinach and broccoli. ? Beans and peas. ? Nuts and seeds, such as almonds and sunflower seeds. ? Whole grains, such as whole grain bread and fortified cereals.  Take magnesium supplements if your health care provider tells you to do that. Take them as directed.  Take over-the-counter and prescription medicines only as told by your health care provider.  Have your magnesium levels monitored as told by your health care provider.  When you are active, drink fluids that contain electrolytes.  Avoid drinking alcohol.  Keep all follow-up visits as told by your health care provider. This is important. Contact a health care provider if:  You get worse instead of better.  Your symptoms return. Get help right away if you:  Develop severe muscle weakness.  Have trouble breathing.  Feel that your heart is racing. Summary  Hypomagnesemia is a condition in which the level of magnesium in the blood is low.  Hypomagnesemia can affect every organ in the body.  Treatment may include eating more foods that contain magnesium, taking magnesium supplements, and not drinking alcohol.  Have your magnesium levels monitored as told by your health care provider. This information is not intended to replace advice given to you by  your health care provider. Make sure you discuss any questions you have with your health care provider. Document Revised: 12/30/2016 Document Reviewed: 12/19/2016 Elsevier Patient Education  2020 [employer].

## 2019-05-21 ENCOUNTER — Telehealth: Payer: Self-pay | Admitting: *Deleted

## 2019-05-21 NOTE — Telephone Encounter (Signed)
Patient called answering service asking what the Eliquis pills look like that MD said to discontinue. His pills are all in a pill box loose and needs to know how to identify the medication. Called him and he reports he called his pharmacy and got this straightened out. He asked to cancelt he order for the walker. He went to a thrift store and found a walker and elevated commode seat both for $25, so no longer needs the walker. Momence to cancel the order.

## 2019-05-22 ENCOUNTER — Other Ambulatory Visit: Payer: Self-pay | Admitting: *Deleted

## 2019-05-22 ENCOUNTER — Inpatient Hospital Stay: Payer: Medicare HMO

## 2019-05-22 NOTE — Patient Outreach (Signed)
[facility]) Care Management  05/22/2019  Doroteo Sydney. 1950/03/14 [id number]   EMMI Red Flag Discharge Red Flag Follow up call    Day : #1 Date: 05/06/19 Red Alert Reason : Questions about discharge papers ? - yes, Know who to call about changes in condition: No   Subjective Unsuccessful outreach call to patient, no answer mailbox is full unable to leave a message.   Plan Will plan return call attempt in the next 4 business days, if no return call for 4th call attempt follow up.    Joylene Draft, RN, BSN  [place] Management Coordinator  [phone]- Mobile [phone]- Toll Free Main Office

## 2019-05-23 ENCOUNTER — Telehealth: Payer: Self-pay | Admitting: *Deleted

## 2019-05-23 NOTE — Telephone Encounter (Addendum)
Left VM asking if Dr. [name] would be willing to complete form for him to obtain placard for handicap parking? Just got his [place] driving license since moving here from [place] and needs to do this now. Informed him we can provide the form on 4/27, but suggested he ask Dr. Cathie Olden at his appointment there tomorrow at 10:15 am

## 2019-05-24 ENCOUNTER — Ambulatory Visit (INDEPENDENT_AMBULATORY_CARE_PROVIDER_SITE_OTHER): Payer: Medicare HMO | Admitting: Cardiovascular Disease

## 2019-05-24 ENCOUNTER — Other Ambulatory Visit: Payer: Self-pay

## 2019-05-24 ENCOUNTER — Encounter: Payer: Self-pay | Admitting: Cardiovascular Disease

## 2019-05-24 VITALS — BP 110/52 | HR 48 | Ht 74.0 in | Wt 247.0 lb

## 2019-05-24 DIAGNOSIS — I483 Typical atrial flutter: Secondary | ICD-10-CM | POA: Diagnosis not present

## 2019-05-24 NOTE — Patient Instructions (Addendum)
Medication Instructions:  Your physician has recommended you make the following change in your medication:  STOP Diltiazem (Cardizem)  *If you need a refill on your cardiac medications before your next appointment, please call your pharmacy*   Lab Work: None Ordered If you have labs (blood work) drawn today and your tests are completely normal, you will receive your results only by: Marland Kitchen MyChart Message (if you have MyChart) OR . A paper copy in the mail If you have any lab test that is abnormal or we need to change your treatment, we will call you to review the results.   Testing/Procedures: None Ordered   Follow-Up: At [facility], you and your health needs are our priority.  As part of our continuing mission to provide you with exceptional heart care, we have created designated Provider Care Teams.  These Care Teams include your primary Cardiologist (physician) and Advanced Practice Providers (APPs -  Physician Assistants and Nurse Practitioners) who all work together to provide you with the care you need, when you need it.  We recommend signing up for the patient portal called "MyChart".  Sign up information is provided on this After Visit Summary.  MyChart is used to connect with patients for Virtual Visits (Telemedicine).  Patients are able to view lab/test results, encounter notes, upcoming appointments, etc.  Non-urgent messages can be sent to your provider as well.   To learn more about what you can do with MyChart, go to [email or web address].    Your next appointment:   3 week(s) on Wed. May 19 at 12:15  The format for your next appointment:   In Person  Provider:   Richardson Dopp, PA-C   Other Instructions Handicap Placard application completed and given to patient

## 2019-05-24 NOTE — Progress Notes (Signed)
Cardiology Office Note:    Date:  05/24/2019   ID:  Clarence Nip., DOB 05-16-1950, MRN [id number]  PCP:  Patient, No Pcp Per  Cardiologist:   Pecolia Marando  Electrophysiologist:  None   Referring MD: No ref. provider found   Chief Complaint  Patient presents with  . Atrial Flutter    History of Present Illness:    Clarence Besse. is a 69 y.o. male with a hx of coronary artery disease, coronary artery bypass grafting Medical at [facility] several weeks ago.  He presented with profuse diarrhea and rapid atrial flutter.  Was diagnosed with Covid a day later. He has a history of throat and tongue cancer. We initially started him on Eliquis but he was seen by Dr. [name]  and was deemed to be too high risk of bleeding so the Eliquis was discontinued.  Is very weak.   Still has diarrhea.  Is being treated with PO vanco  For C.diff colitis.    Was seen on oncology.   HR was found to be in the 120s.  Diltiazem slow release 120 mg a day was added to his metoprolol, digoxin .    recentl labs show low albumin,  Low mag level,  LFTs are ok  WBC is ok   HR is slow     Past Medical History:  Diagnosis Date  . Cancer ([place])   . CHF (congestive heart failure) ([place])   . Colitis   . COVID-19 04/29/2019  . Diabetes ([place])   . Paronychia of second toe of right foot   . Throat cancer [facility])     Past Surgical History:  Procedure Laterality Date  . APPENDECTOMY  1970  . CORONARY ARTERY BYPASS GRAFT     5 vessel   . IR IMAGING GUIDED PORT INSERTION  04/04/2019  . TOOTH EXTRACTION      Current Medications: Current Meds  Medication Sig  . ascorbic acid (VITAMIN C) 500 MG tablet Take 1 tablet (500 mg total) by mouth daily.  . Cholecalciferol (D3-1000 PO) Take 1,000 Units by mouth daily.   . Chromium-Cinnamon [phone] MCG-MG CAPS Take 2,000 mg by mouth daily.  . digoxin (LANOXIN) 0.125 MG tablet Take 1 tablet (0.125 mg total) by mouth daily.  . ferrous sulfate 325 (65 FE) MG  EC tablet Take 325 mg by mouth daily with breakfast.  . glipiZIDE (GLUCOTROL) 10 MG tablet Take 10 mg by mouth daily before breakfast.  . levalbuterol (XOPENEX HFA) 45 MCG/ACT inhaler Inhale 2 puffs into the lungs every 6 (six) hours as needed for wheezing.  . lidocaine-prilocaine (EMLA) cream Apply to port site 1-2 hours prior to use  . loperamide (IMODIUM) 2 MG capsule Take 1-2 capsules by mouth 4 (four) times daily as needed.  . magnesium oxide (MAG-OX) 400 MG tablet Take 800 mg by mouth daily.   . metoprolol tartrate (LOPRESSOR) 100 MG tablet Take 1 tablet (100 mg total) by mouth 2 (two) times daily.  . Multiple Vitamin (MULTIVITAMIN) tablet Take 1 tablet by mouth daily.  . prochlorperazine (COMPAZINE) 10 MG tablet Take 1 tablet (10 mg total) by mouth every 6 (six) hours as needed for nausea or vomiting.  . Turmeric 500 MG CAPS Take 2,000 mg by mouth daily. Taking 2 - 1000 mg tabs daily  . UNABLE TO FIND Take 1 tablet by mouth daily. Med Name: Fungus Clear one daily  . UNABLE TO FIND Take 3 tablets by mouth daily. Med Name: [facility]  3 tabs daily  . vancomycin (VANCOCIN HCL) 125 MG capsule Take one four times daily x 10 days, then twice daily x 7 days, then one daily x 7 days, then one every 3 days x 4 weeks, then stop  . vitamin B-12 (CYANOCOBALAMIN) 500 MCG tablet Take 500 mcg by mouth daily.  . [DISCONTINUED] diltiazem (CARDIZEM CD) 120 MG 24 hr capsule Take 1 capsule (120 mg total) by mouth daily.     Allergies:   Patient has no known allergies.   Social History   Socioeconomic History  . Marital status: Single    Spouse name: Not on file  . Number of children: Not on file  . Years of education: Not on file  . Highest education level: Not on file  Occupational History  . Not on file  Tobacco Use  . Smoking status: Never Smoker  . Smokeless tobacco: Never Used  Substance and Sexual Activity  . Alcohol use: Not Currently  . Drug use: Never  . Sexual activity: Yes  Other  Topics Concern  . Not on file  Social History Narrative  . Not on file   Social Determinants of Health   Financial Resource Strain:   . Difficulty of Paying Living Expenses:   Food Insecurity:   . Worried About Charity fundraiser in the Last Year:   . Arboriculturist in the Last Year:   Transportation Needs:   . Film/video editor (Medical):   Marland Kitchen Lack of Transportation (Non-Medical):   Physical Activity:   . Days of Exercise per Week:   . Minutes of Exercise per Session:   Stress:   . Feeling of Stress :   Social Connections:   . Frequency of Communication with Friends and Family:   . Frequency of Social Gatherings with Friends and Family:   . Attends Religious Services:   . Active Member of Clubs or Organizations:   . Attends Archivist Meetings:   Marland Kitchen Marital Status:      Family History: The patient's family history includes Breast cancer in his mother; Colon polyps in his father; Heart disease in his father. There is no history of Colon cancer, Esophageal cancer, Rectal cancer, or Stomach cancer.  ROS:   Please see the history of present illness.     All other systems reviewed and are negative.  EKGs/Labs/Other Studies Reviewed:    The following studies were reviewed today:   EKG:    Recent Labs: 04/28/2019: B Natriuretic Peptide 261.4 04/29/2019: TSH 0.760 05/20/2019: ALT 33; BUN 18; Creatinine 1.10; Hemoglobin 9.0; Magnesium 1.3; Platelet Count 212; Potassium 4.3; Sodium 137  Recent Lipid Panel    Component Value Date/Time   CHOL 144 04/29/2019 0500   TRIG 93 04/29/2019 0500   HDL 25 (L) 04/29/2019 0500   CHOLHDL 5.8 04/29/2019 0500   VLDL 19 04/29/2019 0500   LDLCALC 100 (H) 04/29/2019 0500    Physical Exam:    VS:  BP (!) 110/52   Pulse (!) 48   Ht 6\' 2"  (1.88 m)   Wt 247 lb (112 kg)   BMI 31.71 kg/m     Wt Readings from Last 3 Encounters:  05/24/19 247 lb (112 kg)  05/20/19 239 lb 11.2 oz (108.7 kg)  05/04/19 236 lb 1.6 oz (107.1  kg)     GEN:  Well nourished, well developed in no acute distress HEENT: Normal NECK: No JVD; No carotid bruits LYMPHATICS: No lymphadenopathy CARDIAC: RRR, no murmurs,  rubs, gallops RESPIRATORY:  Clear to auscultation without rales, wheezing or rhonchi  ABDOMEN: Soft, non-tender, non-distended MUSCULOSKELETAL:  No edema; No deformity  SKIN: Warm and dry NEUROLOGIC:  Alert and oriented x 3 PSYCHIATRIC:  Normal affect   ASSESSMENT:    No diagnosis found. PLAN:    In order of problems listed above:  1. Atrial flutter: Ty is seen for follow-up for his atrial flutter.  His heart rate is very slow.  We will discontinue the diltiazem.  He has been seen by Dr. Julieanne Manson and was determined to be at high risk for bleeding and is now no longer on anticoagulation.  We will continue rate control for now.  2.  Chronic diarrhea: He still has diarrhea.  He had C. difficile colitis last month in the hospital.  He may need to have some additional vancomycin.  I will defer to Dr. [name] for this.    Medication Adjustments/Labs and Tests Ordered: Current medicines are reviewed at length with the patient today.  Concerns regarding medicines are outlined above.  No orders of the defined types were placed in this encounter.  No orders of the defined types were placed in this encounter.   Patient Instructions  Medication Instructions:  Your physician has recommended you make the following change in your medication:  STOP Diltiazem (Cardizem)  *If you need a refill on your cardiac medications before your next appointment, please call your pharmacy*   Lab Work: None Ordered If you have labs (blood work) drawn today and your tests are completely normal, you will receive your results only by: Marland Kitchen MyChart Message (if you have MyChart) OR . A paper copy in the mail If you have any lab test that is abnormal or we need to change your treatment, we will call you to review the results.    Testing/Procedures: None Ordered   Follow-Up: At [facility], you and your health needs are our priority.  As part of our continuing mission to provide you with exceptional heart care, we have created designated Provider Care Teams.  These Care Teams include your primary Cardiologist (physician) and Advanced Practice Providers (APPs -  Physician Assistants and Nurse Practitioners) who all work together to provide you with the care you need, when you need it.  We recommend signing up for the patient portal called "MyChart".  Sign up information is provided on this After Visit Summary.  MyChart is used to connect with patients for Virtual Visits (Telemedicine).  Patients are able to view lab/test results, encounter notes, upcoming appointments, etc.  Non-urgent messages can be sent to your provider as well.   To learn more about what you can do with MyChart, go to [email or web address].    Your next appointment:   3 week(s) on Wed. May 19 at 12:15  The format for your next appointment:   In Person  Provider:   Richardson Dopp, PA-C   Other Instructions Handicap Placard application completed and given to patient     Signed, Mertie Moores, MD  05/24/2019 5:17 PM    [place]

## 2019-05-27 ENCOUNTER — Telehealth: Payer: Self-pay | Admitting: Oncology

## 2019-05-27 ENCOUNTER — Other Ambulatory Visit: Payer: Self-pay | Admitting: *Deleted

## 2019-05-27 ENCOUNTER — Telehealth: Payer: Self-pay | Admitting: *Deleted

## 2019-05-27 NOTE — Progress Notes (Signed)
Pharmacist Chemotherapy Monitoring - Follow Up Assessment    I verify that I have reviewed each item in the below checklist:  . Regimen for the patient is scheduled for the appropriate day and plan matches scheduled date. Marland Kitchen Appropriate non-routine labs are ordered dependent on drug ordered. . If applicable, additional medications reviewed and ordered per protocol based on lifetime cumulative doses and/or treatment regimen.   Plan for follow-up and/or issues identified: No . I-vent associated with next due treatment: No . MD and/or nursing notified: No  Delane Ginger 05/27/2019 10:07 AM

## 2019-05-27 NOTE — Telephone Encounter (Signed)
Called and spoke with patient. Confirmed 4/27 appt

## 2019-05-27 NOTE — Patient Outreach (Signed)
[facility]) Care Management  05/27/2019  Clarence Andrews. 1950/02/15 [id number]   EMMI Red Flag Discharge Red Flag Follow up call    Day : #1 Date: 05/06/19 Red Alert Reason : Questions about discharge papers ? - yes, Know who to call about changes in condition: No   Subjective Unsuccessful outreach call to patient, no answer mailbox is full unable to leave a message.   Plan Will close case as has been greater than 10 days since initial EMMI successful outreach and unsuccessful follow up  Call    Joylene Draft, RN, BSN  [place] Management Coordinator  [phone]- Mobile [phone]- [place]

## 2019-05-27 NOTE — Telephone Encounter (Signed)
Pharmacist reports over weekend he took all his vancomycin out of the blister pack and put the diltiazem in the vancomycin bottom and on Sunday accidentally took diltiazem qid. Noted on the office note it was d/c'd by cardiology Friday. Attempted to reach patient to f/u with no answer or voice mail. Staff message to Dr. Acie Fredrickson with update.

## 2019-05-28 ENCOUNTER — Inpatient Hospital Stay: Payer: Medicare HMO

## 2019-05-28 ENCOUNTER — Other Ambulatory Visit: Payer: Self-pay

## 2019-05-28 ENCOUNTER — Inpatient Hospital Stay (HOSPITAL_BASED_OUTPATIENT_CLINIC_OR_DEPARTMENT_OTHER): Payer: Medicare HMO | Admitting: Nurse Practitioner

## 2019-05-28 ENCOUNTER — Encounter: Payer: Self-pay | Admitting: Nurse Practitioner

## 2019-05-28 VITALS — BP 119/69 | HR 60 | Temp 98.7°F | Resp 20 | Ht 74.0 in | Wt 240.1 lb

## 2019-05-28 DIAGNOSIS — C187 Malignant neoplasm of sigmoid colon: Secondary | ICD-10-CM

## 2019-05-28 DIAGNOSIS — R6889 Other general symptoms and signs: Secondary | ICD-10-CM | POA: Diagnosis not present

## 2019-05-28 DIAGNOSIS — Z5111 Encounter for antineoplastic chemotherapy: Secondary | ICD-10-CM | POA: Diagnosis not present

## 2019-05-28 DIAGNOSIS — I2510 Atherosclerotic heart disease of native coronary artery without angina pectoris: Secondary | ICD-10-CM | POA: Diagnosis not present

## 2019-05-28 DIAGNOSIS — C19 Malignant neoplasm of rectosigmoid junction: Secondary | ICD-10-CM | POA: Diagnosis not present

## 2019-05-28 DIAGNOSIS — I4892 Unspecified atrial flutter: Secondary | ICD-10-CM | POA: Diagnosis not present

## 2019-05-28 DIAGNOSIS — E119 Type 2 diabetes mellitus without complications: Secondary | ICD-10-CM | POA: Diagnosis not present

## 2019-05-28 DIAGNOSIS — Z951 Presence of aortocoronary bypass graft: Secondary | ICD-10-CM | POA: Diagnosis not present

## 2019-05-28 DIAGNOSIS — C186 Malignant neoplasm of descending colon: Secondary | ICD-10-CM

## 2019-05-28 DIAGNOSIS — C787 Secondary malignant neoplasm of liver and intrahepatic bile duct: Secondary | ICD-10-CM | POA: Diagnosis not present

## 2019-05-28 DIAGNOSIS — Z95828 Presence of other vascular implants and grafts: Secondary | ICD-10-CM

## 2019-05-28 DIAGNOSIS — Z85810 Personal history of malignant neoplasm of tongue: Secondary | ICD-10-CM | POA: Diagnosis not present

## 2019-05-28 DIAGNOSIS — Z8616 Personal history of COVID-19: Secondary | ICD-10-CM | POA: Diagnosis not present

## 2019-05-28 LAB — CBC WITH DIFFERENTIAL (CANCER CENTER ONLY)
Abs Immature Granulocytes: 0.02 10*3/uL (ref 0.00–0.07)
Basophils Absolute: 0 10*3/uL (ref 0.0–0.1)
Basophils Relative: 0 %
Eosinophils Absolute: 0.2 10*3/uL (ref 0.0–0.5)
Eosinophils Relative: 3 %
HCT: 29.9 % — ABNORMAL LOW (ref 39.0–52.0)
Hemoglobin: 9.3 g/dL — ABNORMAL LOW (ref 13.0–17.0)
Immature Granulocytes: 0 %
Lymphocytes Relative: 15 %
Lymphs Abs: 1 10*3/uL (ref 0.7–4.0)
MCH: 26.6 pg (ref 26.0–34.0)
MCHC: 31.1 g/dL (ref 30.0–36.0)
MCV: 85.7 fL (ref 80.0–100.0)
Monocytes Absolute: 0.6 10*3/uL (ref 0.1–1.0)
Monocytes Relative: 9 %
Neutro Abs: 4.9 10*3/uL (ref 1.7–7.7)
Neutrophils Relative %: 73 %
Platelet Count: 242 10*3/uL (ref 150–400)
RBC: 3.49 MIL/uL — ABNORMAL LOW (ref 4.22–5.81)
RDW: 16 % — ABNORMAL HIGH (ref 11.5–15.5)
WBC Count: 6.7 10*3/uL (ref 4.0–10.5)
nRBC: 0 % (ref 0.0–0.2)

## 2019-05-28 LAB — CMP (CANCER CENTER ONLY)
ALT: 17 U/L (ref 0–44)
AST: 22 U/L (ref 15–41)
Albumin: 2.4 g/dL — ABNORMAL LOW (ref 3.5–5.0)
Alkaline Phosphatase: 119 U/L (ref 38–126)
Anion gap: 11 (ref 5–15)
BUN: 21 mg/dL (ref 8–23)
CO2: 27 mmol/L (ref 22–32)
Calcium: 9 mg/dL (ref 8.9–10.3)
Chloride: 101 mmol/L (ref 98–111)
Creatinine: 1.22 mg/dL (ref 0.61–1.24)
GFR, Est AFR Am: >60 mL/min (ref >60)
GFR, Estimated: 59 mL/min — ABNORMAL LOW (ref >60)
Glucose, Bld: 127 mg/dL — ABNORMAL HIGH (ref 70–99)
Potassium: 4.2 mmol/L (ref 3.5–5.1)
Sodium: 139 mmol/L (ref 135–145)
Total Bilirubin: 0.6 mg/dL (ref 0.3–1.2)
Total Protein: 7 g/dL (ref 6.5–8.1)

## 2019-05-28 LAB — MAGNESIUM: Magnesium: 1.7 mg/dL (ref 1.7–2.4)

## 2019-05-28 LAB — SAMPLE TO BLOOD BANK

## 2019-05-28 MED ORDER — OXALIPLATIN CHEMO INJECTION 100 MG/20ML
83.0000 mg/m2 | Freq: Once | INTRAVENOUS | Status: AC
  Administered 2019-05-28: 200 mg via INTRAVENOUS
  Filled 2019-05-28: qty 40

## 2019-05-28 MED ORDER — DEXTROSE 5 % IV SOLN
Freq: Once | INTRAVENOUS | Status: AC
  Filled 2019-05-28: qty 250

## 2019-05-28 MED ORDER — PALONOSETRON HCL INJECTION 0.25 MG/5ML
0.2500 mg | Freq: Once | INTRAVENOUS | Status: AC
  Administered 2019-05-28: 0.25 mg via INTRAVENOUS

## 2019-05-28 MED ORDER — SODIUM CHLORIDE 0.9 % IV SOLN
2000.0000 mg/m2 | INTRAVENOUS | Status: DC
  Administered 2019-05-28: 4850 mg via INTRAVENOUS
  Filled 2019-05-28: qty 97

## 2019-05-28 MED ORDER — LEUCOVORIN CALCIUM INJECTION 350 MG
400.0000 mg/m2 | Freq: Once | INTRAVENOUS | Status: AC
  Administered 2019-05-28: 972 mg via INTRAVENOUS
  Filled 2019-05-28: qty 48.6

## 2019-05-28 MED ORDER — SODIUM CHLORIDE 0.9 % IV SOLN
10.0000 mg | Freq: Once | INTRAVENOUS | Status: AC
  Administered 2019-05-28: 10 mg via INTRAVENOUS
  Filled 2019-05-28: qty 10
  Filled 2019-05-28: qty 1

## 2019-05-28 MED ORDER — PALONOSETRON HCL INJECTION 0.25 MG/5ML
INTRAVENOUS | Status: AC
  Filled 2019-05-28: qty 5

## 2019-05-28 MED ORDER — SODIUM CHLORIDE 0.9% FLUSH
10.0000 mL | INTRAVENOUS | Status: DC | PRN
  Administered 2019-05-28: 10 mL via INTRAVENOUS
  Filled 2019-05-28: qty 10

## 2019-05-28 NOTE — Progress Notes (Addendum)
[place] OFFICE PROGRESS NOTE   Diagnosis: Colon cancer  INTERVAL HISTORY:   Clarence Andrews returns as scheduled.  He completed cycle 1 FOLFOX 04/11/2019.  He subsequently developed severe neutropenia, COVID-19 infection, C. difficile colitis and atrial flutter.  He is seen today prior to proceeding with cycle 2 FOLFOX.  He continues to have small-volume frequent loose stools, estimates 4 to 5/day; associated urgency.  He continues the vancomycin taper.  No fever.  No abdominal pain.  No bleeding with bowel movements.  He denies nausea/vomiting.  Overall good appetite and good fluid intake.  Objective:  Vital signs in last 24 hours:  Blood pressure 119/69, pulse 60, temperature 98.7 F (37.1 C), temperature source Temporal, resp. rate 20, height 6' 2"  (1.88 m), weight 240 lb 1.6 oz (108.9 kg), SpO2 100 %.    HEENT: No thrush or ulcers.  Mucous membranes appear moist. Resp: Lungs clear bilaterally. Cardio: Regular with intermittent grouped beats. GI: Abdomen soft and nontender.  No hepatomegaly. Vascular: Trace edema at the lower legs/ankles bilaterally. Skin: Palms without erythema. Port-A-Cath without erythema.   Lab Results:  Lab Results  Component Value Date   WBC 6.7 05/28/2019   HGB 9.3 (L) 05/28/2019   HCT 29.9 (L) 05/28/2019   MCV 85.7 05/28/2019   PLT 242 05/28/2019   NEUTROABS 4.9 05/28/2019    Imaging:  No results found.  Medications: I have reviewed the patient's current medications.  Assessment/Plan: 1. 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 Colonoscopy 03/07/2019-nearly completely obstructing mass beginning at 16 cm from the anal verge, sigmoid colon polyp ? CTs 03/15/2019-wall thickening of the lower sigmoid colon, low rectal mass with significant luminal narrowing, diffuse liver metastases, borderline enlarged sigmoid mesocolon, iliac, and upper abdominal lymph nodes ? Elevated CEA ? Biopsy liver  lesion 03/29/2019-metastatic adenocarcinoma to liver consistent with clinical impression of colorectal primary.MSS, tumor mutation burden-3, K-ras G12D, PIK3CA mutations ? Cycle 1 FOLFOX 04/11/2019 ? Cycle 2 FOLFOX 05/28/2019 2. 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 Diabetes 4. Coronary artery disease, status post coronary artery bypass surgery in 2017 5. Neutropenia following cycle 1 FOLFOX 6. Admission 04/28/2019 with COVID-19 infection 7. C. difficile colitis 04/28/2019 8. Atrial flutter during hospital admission March/April 2021-treated with a Cardizem drip, digoxin, and Eliquis anticoagulation.  Converted to metoprolol at discharge.  Disposition: Clarence Andrews appears stable.  He completed cycle 1 FOLFOX 04/11/2019.  Further chemotherapy on hold due to multiple complications including neutropenia, COVID-19 infection, C. difficile colitis and atrial flutter.  Plan to proceed with cycle 2 FOLFOX today as scheduled.  5-FU bolus will be eliminated and continuous infusion dose decreased.  He will receive Udenyca on the day of pump discontinuation.  We reviewed potential toxicities associated with Udenyca including bone pain, rash, splenic rupture.  He agrees to proceed.  He has persistent diarrhea.  At this point the diarrhea is unlikely to be related to chemotherapy.  The diarrhea may be due to the rectal tumor.  He will continue the vancomycin taper with Imodium as needed.  He will return for a follow-up appointment in approximately 1 week.  He will contact the office in the interim with any problems.  Patient seen with Dr. [name].    Ned Card ANP/GNP-BC   05/28/2019  10:48 AM This was a shared visit with Ned Card.  Clarence Andrews was interviewed and examined.  His performance status  appears improved today.  He does not appear dehydrated.  He reports frequent small-volume loose  stools.  This may be related to the rectosigmoid tumor.  He will continue a prolonged vancomycin taper.  He will contact us for increased diarrhea.  The 5-FU bolus will be discontinued with this cycle of chemotherapy.  The infusional 5-FU will be dose reduced.  Julieanne Manson, MD

## 2019-05-28 NOTE — Patient Instructions (Signed)
[facility] Discharge Instructions for Patients Receiving Chemotherapy  Today you received the following chemotherapy agents Oxaliplatin, Leucovorin, 5FU  To help prevent nausea and vomiting after your treatment, we encourage you to take your nausea medication as directed   If you develop nausea and vomiting that is not controlled by your nausea medication, call the clinic.   BELOW ARE SYMPTOMS THAT SHOULD BE REPORTED IMMEDIATELY:  *FEVER GREATER THAN 100.5 F  *CHILLS WITH OR WITHOUT FEVER  NAUSEA AND VOMITING THAT IS NOT CONTROLLED WITH YOUR NAUSEA MEDICATION  *UNUSUAL SHORTNESS OF BREATH  *UNUSUAL BRUISING OR BLEEDING  TENDERNESS IN MOUTH AND THROAT WITH OR WITHOUT PRESENCE OF ULCERS  *URINARY PROBLEMS  *BOWEL PROBLEMS  UNUSUAL RASH Items with * indicate a potential emergency and should be followed up as soon as possible.  Feel free to call the clinic should you have any questions or concerns. The clinic phone number is [phone].  Please show the CHEMO ALERT CARD at check-in to the Emergency Department and triage nurse.   

## 2019-05-30 ENCOUNTER — Inpatient Hospital Stay: Payer: Medicare HMO

## 2019-05-30 ENCOUNTER — Other Ambulatory Visit: Payer: Self-pay

## 2019-05-30 VITALS — BP 122/72 | HR 62 | Temp 98.2°F | Resp 18

## 2019-05-30 DIAGNOSIS — I2510 Atherosclerotic heart disease of native coronary artery without angina pectoris: Secondary | ICD-10-CM | POA: Diagnosis not present

## 2019-05-30 DIAGNOSIS — Z8616 Personal history of COVID-19: Secondary | ICD-10-CM | POA: Diagnosis not present

## 2019-05-30 DIAGNOSIS — C187 Malignant neoplasm of sigmoid colon: Secondary | ICD-10-CM

## 2019-05-30 DIAGNOSIS — Z951 Presence of aortocoronary bypass graft: Secondary | ICD-10-CM | POA: Diagnosis not present

## 2019-05-30 DIAGNOSIS — R6889 Other general symptoms and signs: Secondary | ICD-10-CM | POA: Diagnosis not present

## 2019-05-30 DIAGNOSIS — C19 Malignant neoplasm of rectosigmoid junction: Secondary | ICD-10-CM | POA: Diagnosis not present

## 2019-05-30 DIAGNOSIS — C787 Secondary malignant neoplasm of liver and intrahepatic bile duct: Secondary | ICD-10-CM | POA: Diagnosis not present

## 2019-05-30 DIAGNOSIS — C186 Malignant neoplasm of descending colon: Secondary | ICD-10-CM

## 2019-05-30 DIAGNOSIS — I4892 Unspecified atrial flutter: Secondary | ICD-10-CM | POA: Diagnosis not present

## 2019-05-30 DIAGNOSIS — E119 Type 2 diabetes mellitus without complications: Secondary | ICD-10-CM | POA: Diagnosis not present

## 2019-05-30 DIAGNOSIS — Z5111 Encounter for antineoplastic chemotherapy: Secondary | ICD-10-CM | POA: Diagnosis not present

## 2019-05-30 DIAGNOSIS — Z85810 Personal history of malignant neoplasm of tongue: Secondary | ICD-10-CM | POA: Diagnosis not present

## 2019-05-30 MED ORDER — PEGFILGRASTIM-CBQV 6 MG/0.6ML ~~LOC~~ SOSY
6.0000 mg | PREFILLED_SYRINGE | Freq: Once | SUBCUTANEOUS | Status: AC
  Administered 2019-05-30: 6 mg via SUBCUTANEOUS

## 2019-05-30 MED ORDER — PEGFILGRASTIM-CBQV 6 MG/0.6ML ~~LOC~~ SOSY
PREFILLED_SYRINGE | SUBCUTANEOUS | Status: AC
  Filled 2019-05-30: qty 0.6

## 2019-05-30 MED ORDER — HEPARIN SOD (PORK) LOCK FLUSH 100 UNIT/ML IV SOLN
500.0000 [IU] | Freq: Once | INTRAVENOUS | Status: AC | PRN
  Administered 2019-05-30: 500 [IU]
  Filled 2019-05-30: qty 5

## 2019-05-30 MED ORDER — SODIUM CHLORIDE 0.9% FLUSH
10.0000 mL | INTRAVENOUS | Status: DC | PRN
  Administered 2019-05-30: 10 mL
  Filled 2019-05-30: qty 10

## 2019-05-30 NOTE — Patient Instructions (Signed)

## 2019-05-31 ENCOUNTER — Telehealth: Payer: Self-pay | Admitting: Oncology

## 2019-05-31 NOTE — Telephone Encounter (Signed)
Scheduled per los. Called, not able to leave msg. Mailed printout for later appts

## 2019-06-03 ENCOUNTER — Telehealth: Payer: Self-pay | Admitting: *Deleted

## 2019-06-03 ENCOUNTER — Inpatient Hospital Stay: Payer: Medicare HMO | Attending: Oncology | Admitting: Nurse Practitioner

## 2019-06-03 DIAGNOSIS — I4891 Unspecified atrial fibrillation: Secondary | ICD-10-CM | POA: Insufficient documentation

## 2019-06-03 DIAGNOSIS — Z5111 Encounter for antineoplastic chemotherapy: Secondary | ICD-10-CM | POA: Insufficient documentation

## 2019-06-03 DIAGNOSIS — D701 Agranulocytosis secondary to cancer chemotherapy: Secondary | ICD-10-CM | POA: Insufficient documentation

## 2019-06-03 DIAGNOSIS — C19 Malignant neoplasm of rectosigmoid junction: Secondary | ICD-10-CM | POA: Insufficient documentation

## 2019-06-03 DIAGNOSIS — I2510 Atherosclerotic heart disease of native coronary artery without angina pectoris: Secondary | ICD-10-CM | POA: Insufficient documentation

## 2019-06-03 DIAGNOSIS — E119 Type 2 diabetes mellitus without complications: Secondary | ICD-10-CM | POA: Insufficient documentation

## 2019-06-03 DIAGNOSIS — Z5189 Encounter for other specified aftercare: Secondary | ICD-10-CM | POA: Insufficient documentation

## 2019-06-03 DIAGNOSIS — Z85810 Personal history of malignant neoplasm of tongue: Secondary | ICD-10-CM | POA: Insufficient documentation

## 2019-06-03 DIAGNOSIS — Z79899 Other long term (current) drug therapy: Secondary | ICD-10-CM | POA: Insufficient documentation

## 2019-06-03 DIAGNOSIS — Z7984 Long term (current) use of oral hypoglycemic drugs: Secondary | ICD-10-CM | POA: Insufficient documentation

## 2019-06-03 DIAGNOSIS — C787 Secondary malignant neoplasm of liver and intrahepatic bile duct: Secondary | ICD-10-CM | POA: Insufficient documentation

## 2019-06-03 DIAGNOSIS — Z8616 Personal history of COVID-19: Secondary | ICD-10-CM | POA: Insufficient documentation

## 2019-06-03 DIAGNOSIS — R000 Tachycardia, unspecified: Secondary | ICD-10-CM | POA: Insufficient documentation

## 2019-06-03 NOTE — Telephone Encounter (Signed)
Called pt to f/u about missing appt this morning. There was no answer and unable to leave voice message.

## 2019-06-05 NOTE — Progress Notes (Signed)
Pharmacist Chemotherapy Monitoring - Follow Up Assessment    I verify that I have reviewed each item in the below checklist:  . Regimen for the patient is scheduled for the appropriate day and plan matches scheduled date. Marland Kitchen Appropriate non-routine labs are ordered dependent on drug ordered. . If applicable, additional medications reviewed and ordered per protocol based on lifetime cumulative doses and/or treatment regimen.   Plan for follow-up and/or issues identified: No . I-vent associated with next due treatment: No . MD and/or nursing notified: No  Beatriz Quintela K 06/05/2019 11:21 AM

## 2019-06-09 ENCOUNTER — Other Ambulatory Visit: Payer: Self-pay | Admitting: Oncology

## 2019-06-11 ENCOUNTER — Inpatient Hospital Stay: Payer: Medicare HMO

## 2019-06-11 ENCOUNTER — Other Ambulatory Visit: Payer: Self-pay

## 2019-06-11 ENCOUNTER — Encounter: Payer: Self-pay | Admitting: *Deleted

## 2019-06-11 ENCOUNTER — Encounter: Payer: Self-pay | Admitting: Nurse Practitioner

## 2019-06-11 ENCOUNTER — Inpatient Hospital Stay: Payer: Medicare HMO | Admitting: Nurse Practitioner

## 2019-06-11 VITALS — HR 97

## 2019-06-11 VITALS — BP 132/93 | HR 104 | Temp 98.5°F | Resp 17 | Ht 74.0 in | Wt 227.5 lb

## 2019-06-11 DIAGNOSIS — Z8616 Personal history of COVID-19: Secondary | ICD-10-CM | POA: Diagnosis not present

## 2019-06-11 DIAGNOSIS — Z5111 Encounter for antineoplastic chemotherapy: Secondary | ICD-10-CM | POA: Diagnosis not present

## 2019-06-11 DIAGNOSIS — C187 Malignant neoplasm of sigmoid colon: Secondary | ICD-10-CM

## 2019-06-11 DIAGNOSIS — D701 Agranulocytosis secondary to cancer chemotherapy: Secondary | ICD-10-CM | POA: Diagnosis not present

## 2019-06-11 DIAGNOSIS — C787 Secondary malignant neoplasm of liver and intrahepatic bile duct: Secondary | ICD-10-CM | POA: Diagnosis not present

## 2019-06-11 DIAGNOSIS — I4891 Unspecified atrial fibrillation: Secondary | ICD-10-CM | POA: Diagnosis not present

## 2019-06-11 DIAGNOSIS — C186 Malignant neoplasm of descending colon: Secondary | ICD-10-CM

## 2019-06-11 DIAGNOSIS — E119 Type 2 diabetes mellitus without complications: Secondary | ICD-10-CM | POA: Diagnosis not present

## 2019-06-11 DIAGNOSIS — Z79899 Other long term (current) drug therapy: Secondary | ICD-10-CM | POA: Diagnosis not present

## 2019-06-11 DIAGNOSIS — C19 Malignant neoplasm of rectosigmoid junction: Secondary | ICD-10-CM | POA: Diagnosis not present

## 2019-06-11 DIAGNOSIS — Z95828 Presence of other vascular implants and grafts: Secondary | ICD-10-CM

## 2019-06-11 DIAGNOSIS — Z5189 Encounter for other specified aftercare: Secondary | ICD-10-CM | POA: Diagnosis not present

## 2019-06-11 DIAGNOSIS — Z7984 Long term (current) use of oral hypoglycemic drugs: Secondary | ICD-10-CM | POA: Diagnosis not present

## 2019-06-11 DIAGNOSIS — R6889 Other general symptoms and signs: Secondary | ICD-10-CM | POA: Diagnosis not present

## 2019-06-11 DIAGNOSIS — R000 Tachycardia, unspecified: Secondary | ICD-10-CM | POA: Diagnosis not present

## 2019-06-11 DIAGNOSIS — I2510 Atherosclerotic heart disease of native coronary artery without angina pectoris: Secondary | ICD-10-CM | POA: Diagnosis not present

## 2019-06-11 DIAGNOSIS — Z85810 Personal history of malignant neoplasm of tongue: Secondary | ICD-10-CM | POA: Diagnosis not present

## 2019-06-11 LAB — CBC WITH DIFFERENTIAL (CANCER CENTER ONLY)
Abs Immature Granulocytes: 0.03 10*3/uL (ref 0.00–0.07)
Basophils Absolute: 0 10*3/uL (ref 0.0–0.1)
Basophils Relative: 1 %
Eosinophils Absolute: 0.1 10*3/uL (ref 0.0–0.5)
Eosinophils Relative: 3 %
HCT: 30.2 % — ABNORMAL LOW (ref 39.0–52.0)
Hemoglobin: 9.2 g/dL — ABNORMAL LOW (ref 13.0–17.0)
Immature Granulocytes: 1 %
Lymphocytes Relative: 28 %
Lymphs Abs: 1 10*3/uL (ref 0.7–4.0)
MCH: 27.1 pg (ref 26.0–34.0)
MCHC: 30.5 g/dL (ref 30.0–36.0)
MCV: 88.8 fL (ref 80.0–100.0)
Monocytes Absolute: 0.7 10*3/uL (ref 0.1–1.0)
Monocytes Relative: 20 %
Neutro Abs: 1.7 10*3/uL (ref 1.7–7.7)
Neutrophils Relative %: 47 %
Platelet Count: 181 10*3/uL (ref 150–400)
RBC: 3.4 MIL/uL — ABNORMAL LOW (ref 4.22–5.81)
RDW: 16.9 % — ABNORMAL HIGH (ref 11.5–15.5)
WBC Count: 3.5 10*3/uL — ABNORMAL LOW (ref 4.0–10.5)
nRBC: 0 % (ref 0.0–0.2)

## 2019-06-11 LAB — CMP (CANCER CENTER ONLY)
ALT: 15 U/L (ref 0–44)
AST: 30 U/L (ref 15–41)
Albumin: 2.7 g/dL — ABNORMAL LOW (ref 3.5–5.0)
Alkaline Phosphatase: 123 U/L (ref 38–126)
Anion gap: 9 (ref 5–15)
BUN: 18 mg/dL (ref 8–23)
CO2: 26 mmol/L (ref 22–32)
Calcium: 8.9 mg/dL (ref 8.9–10.3)
Chloride: 102 mmol/L (ref 98–111)
Creatinine: 1.42 mg/dL — ABNORMAL HIGH (ref 0.61–1.24)
GFR, Est AFR Am: 57 mL/min — ABNORMAL LOW (ref >60)
GFR, Estimated: 49 mL/min — ABNORMAL LOW (ref >60)
Glucose, Bld: 244 mg/dL — ABNORMAL HIGH (ref 70–99)
Potassium: 4.1 mmol/L (ref 3.5–5.1)
Sodium: 137 mmol/L (ref 135–145)
Total Bilirubin: 0.4 mg/dL (ref 0.3–1.2)
Total Protein: 6.7 g/dL (ref 6.5–8.1)

## 2019-06-11 LAB — CEA (IN HOUSE-CHCC): CEA (CHCC-In House): 324.78 ng/mL — ABNORMAL HIGH (ref 0.00–5.00)

## 2019-06-11 MED ORDER — PALONOSETRON HCL INJECTION 0.25 MG/5ML
INTRAVENOUS | Status: AC
  Filled 2019-06-11: qty 5

## 2019-06-11 MED ORDER — SODIUM CHLORIDE 0.9 % IV SOLN
2000.0000 mg/m2 | INTRAVENOUS | Status: DC
  Administered 2019-06-11: 4850 mg via INTRAVENOUS
  Filled 2019-06-11: qty 97

## 2019-06-11 MED ORDER — PALONOSETRON HCL INJECTION 0.25 MG/5ML
0.2500 mg | Freq: Once | INTRAVENOUS | Status: AC
  Administered 2019-06-11: 0.25 mg via INTRAVENOUS

## 2019-06-11 MED ORDER — DEXTROSE 5 % IV SOLN
Freq: Once | INTRAVENOUS | Status: AC
  Filled 2019-06-11: qty 250

## 2019-06-11 MED ORDER — SODIUM CHLORIDE 0.9% FLUSH
10.0000 mL | INTRAVENOUS | Status: DC | PRN
  Administered 2019-06-11: 10 mL
  Filled 2019-06-11: qty 10

## 2019-06-11 MED ORDER — LEUCOVORIN CALCIUM INJECTION 350 MG
400.0000 mg/m2 | Freq: Once | INTRAVENOUS | Status: AC
  Administered 2019-06-11: 972 mg via INTRAVENOUS
  Filled 2019-06-11: qty 48.6

## 2019-06-11 MED ORDER — OXALIPLATIN CHEMO INJECTION 100 MG/20ML
83.0000 mg/m2 | Freq: Once | INTRAVENOUS | Status: AC
  Administered 2019-06-11: 200 mg via INTRAVENOUS
  Filled 2019-06-11: qty 40

## 2019-06-11 MED ORDER — SODIUM CHLORIDE 0.9 % IV SOLN
10.0000 mg | Freq: Once | INTRAVENOUS | Status: AC
  Administered 2019-06-11: 13:00:00 10 mg via INTRAVENOUS
  Filled 2019-06-11: qty 10

## 2019-06-11 NOTE — Progress Notes (Addendum)
[place] OFFICE PROGRESS NOTE   Diagnosis:  Colon cancer  INTERVAL HISTORY:   [name] returns as scheduled.  He completed cycle 2 FOLFOX 05/28/2019.  5-FU was dose adjusted and he received Udenyca on the day of pump discontinuation.  He continues to have diarrhea, up to 6 loose stools a day.  He feels the diarrhea is improving.  He was not aware he could take Imodium.  No nausea or vomiting.  No mouth sores.  No hand or foot pain or redness.  He has occasional "left kidney" pain.  He has lost weight since his last visit.  He reports he is hungry but does not have money to buy food.  His refrigerator is not working.  Objective:  Vital signs in last 24 hours:  Blood pressure (!) 132/93, pulse (!) 104, temperature 98.5 F (36.9 C), temperature source Temporal, resp. rate 17, height 6' 2"  (1.88 m), weight 227 lb 8 oz (103.2 kg), SpO2 100 %.    HEENT: No thrush or ulcers.  Mucous membranes appear moist. GI: Abdomen soft and nontender.  No hepatomegaly. Vascular: No leg edema.  Skin: Palms without erythema.  Skin turgor intact. Port-A-Cath without erythema.   Lab Results:  Lab Results  Component Value Date   WBC 3.5 (L) 06/11/2019   HGB 9.2 (L) 06/11/2019   HCT 30.2 (L) 06/11/2019   MCV 88.8 06/11/2019   PLT 181 06/11/2019   NEUTROABS 1.7 06/11/2019    Imaging:  No results found.  Medications: I have reviewed the patient's current medications.  Assessment/Plan: 1. 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 Colonoscopy 03/07/2019-nearly completely obstructing mass beginning at 16 cm from the anal verge, sigmoid colon polyp ? CTs 03/15/2019-wall thickening of the lower sigmoid colon, low rectal mass with significant luminal narrowing, diffuse liver metastases, borderline enlarged sigmoid mesocolon, iliac, and upper abdominal lymph nodes ? Elevated CEA ? Biopsy liver lesion 03/29/2019-metastatic adenocarcinoma to liver  consistent with clinical impression of colorectal primary.MSS, tumor mutation burden-3, K-ras G12D, PIK3CA mutations ? Cycle 1 FOLFOX 04/11/2019 ? Cycle 2 FOLFOX 05/28/2019  ? Cycle 3 FOLFOX 06/11/2019 2. 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 Diabetes 4. Coronary artery disease, status post coronary artery bypass surgery in 2017 5. Neutropenia following cycle 1 FOLFOX 6. Admission 04/28/2019 with COVID-19 infection 7. C. difficile colitis 04/28/2019 8. Atrial flutter during hospital admission March/April 2021-treated with a Cardizem drip, digoxin, and Eliquis anticoagulation. Converted to metoprolol at discharge.  Disposition: [name] appears stable.  He has completed 2 cycles of FOLFOX.  Plan to proceed with cycle 3 today as scheduled.  We reviewed the CBC and chemistry panel from today.  Labs adequate to proceed with treatment.  He will again receive Udenyca on the day of pump discontinuation.  Creatinine is elevated.  He may be mildly dehydrated.  He will receive a liter of IV fluids on the day of pump discontinuation.  We will ask the [place] worker to meet with him on an urgent basis while in the office today to see if arrangements can be made for food assistance.  He will take Imodium as needed for the diarrhea.  He continues the prolonged vancomycin taper.  He will return for lab, follow-up, cycle 4 FOLFOX in 2 weeks.  He will contact the office in the interim with any problems.  Ned Card ANP/GNP-BC   06/11/2019  11:11 AM  This was a shared with he continues to have diarrhea, but this has improved.  He continues a prolonged vancomycin taper.  It is unclear whether the diarrhea is related to C. difficile infection, chemotherapy, or another etiology.  He relates weight loss to lack of food.  I doubt the diarrhea is related to chemotherapy.  He will complete a  third cycle of FOLFOX today with G-CSF support.  We will asked the social worker to see him for food assistance.  Julieanne Manson, MD

## 2019-06-11 NOTE — Progress Notes (Signed)
[facility] Work  Clinical Social Work received referral from medical oncology for food insecurities.  CSW met with patient in the infusion room to offer support and assess for needs.  Patient stated his refrigerator had broken and he was low on food.  Patient stated he was told his unemployment was going to stop in June, and he had planned to use that income to replace his refrigerator.  Patient reported he is still receiving his social security; which "covers all of my bills".  Patient stated he had been eating at a restaurant close to his home.  Patient stated he had a microwave, oven and air fryer.  CSW offered patient a box/bag of food from the [employer].  This is food that would not have to be refrigerated.  Patient was appreciative and will contact CSW with additional needs or concerns.  Food box/bag were left at the front desk.  Patient knows to pick up after his treatment.    Johnnye Lana, MSW, LCSW, OSW-C Clinical Social Worker [facility] [phone]

## 2019-06-11 NOTE — Patient Instructions (Signed)
[facility] Discharge Instructions for Patients Receiving Chemotherapy  Today you received the following chemotherapy agents Oxaliplatin, Leucovorin, 5FU  To help prevent nausea and vomiting after your treatment, we encourage you to take your nausea medication as directed   If you develop nausea and vomiting that is not controlled by your nausea medication, call the clinic.   BELOW ARE SYMPTOMS THAT SHOULD BE REPORTED IMMEDIATELY:  *FEVER GREATER THAN 100.5 F  *CHILLS WITH OR WITHOUT FEVER  NAUSEA AND VOMITING THAT IS NOT CONTROLLED WITH YOUR NAUSEA MEDICATION  *UNUSUAL SHORTNESS OF BREATH  *UNUSUAL BRUISING OR BLEEDING  TENDERNESS IN MOUTH AND THROAT WITH OR WITHOUT PRESENCE OF ULCERS  *URINARY PROBLEMS  *BOWEL PROBLEMS  UNUSUAL RASH Items with * indicate a potential emergency and should be followed up as soon as possible.  Feel free to call the clinic should you have any questions or concerns. The clinic phone number is [phone].  Please show the CHEMO ALERT CARD at check-in to the Emergency Department and triage nurse.   

## 2019-06-11 NOTE — Patient Instructions (Signed)

## 2019-06-13 ENCOUNTER — Emergency Department (HOSPITAL_COMMUNITY)
Admission: EM | Admit: 2019-06-13 | Discharge: 2019-06-13 | Disposition: A | Discharge status: Home / Self Care | Payer: Medicare HMO | Attending: Emergency Medicine | Admitting: Emergency Medicine

## 2019-06-13 ENCOUNTER — Other Ambulatory Visit: Payer: Self-pay

## 2019-06-13 ENCOUNTER — Other Ambulatory Visit: Payer: Self-pay | Admitting: Nurse Practitioner

## 2019-06-13 ENCOUNTER — Encounter (HOSPITAL_COMMUNITY): Payer: Self-pay

## 2019-06-13 ENCOUNTER — Inpatient Hospital Stay: Payer: Medicare HMO

## 2019-06-13 ENCOUNTER — Encounter: Payer: Self-pay | Admitting: Nurse Practitioner

## 2019-06-13 ENCOUNTER — Inpatient Hospital Stay (HOSPITAL_BASED_OUTPATIENT_CLINIC_OR_DEPARTMENT_OTHER): Payer: Medicare HMO | Admitting: Nurse Practitioner

## 2019-06-13 ENCOUNTER — Telehealth: Payer: Self-pay | Admitting: Oncology

## 2019-06-13 VITALS — BP 128/90 | HR 138 | Temp 98.7°F | Resp 18

## 2019-06-13 VITALS — BP 142/80 | HR 132 | Temp 98.7°F | Resp 20

## 2019-06-13 DIAGNOSIS — C187 Malignant neoplasm of sigmoid colon: Secondary | ICD-10-CM

## 2019-06-13 DIAGNOSIS — Z7984 Long term (current) use of oral hypoglycemic drugs: Secondary | ICD-10-CM | POA: Diagnosis not present

## 2019-06-13 DIAGNOSIS — Z79899 Other long term (current) drug therapy: Secondary | ICD-10-CM | POA: Diagnosis not present

## 2019-06-13 DIAGNOSIS — I5023 Acute on chronic systolic (congestive) heart failure: Secondary | ICD-10-CM | POA: Diagnosis not present

## 2019-06-13 DIAGNOSIS — C186 Malignant neoplasm of descending colon: Secondary | ICD-10-CM

## 2019-06-13 DIAGNOSIS — E119 Type 2 diabetes mellitus without complications: Secondary | ICD-10-CM | POA: Diagnosis not present

## 2019-06-13 DIAGNOSIS — I4891 Unspecified atrial fibrillation: Secondary | ICD-10-CM | POA: Insufficient documentation

## 2019-06-13 DIAGNOSIS — R6889 Other general symptoms and signs: Secondary | ICD-10-CM | POA: Diagnosis not present

## 2019-06-13 DIAGNOSIS — Z8616 Personal history of COVID-19: Secondary | ICD-10-CM | POA: Insufficient documentation

## 2019-06-13 MED ORDER — METOPROLOL TARTRATE 25 MG PO TABS
100.0000 mg | ORAL_TABLET | Freq: Once | ORAL | Status: AC
  Administered 2019-06-13: 100 mg via ORAL
  Filled 2019-06-13: qty 4

## 2019-06-13 MED ORDER — SODIUM CHLORIDE 0.9% FLUSH
10.0000 mL | INTRAVENOUS | Status: DC | PRN
  Administered 2019-06-13: 10 mL
  Filled 2019-06-13: qty 10

## 2019-06-13 MED ORDER — PEGFILGRASTIM-CBQV 6 MG/0.6ML ~~LOC~~ SOSY
6.0000 mg | PREFILLED_SYRINGE | Freq: Once | SUBCUTANEOUS | Status: AC
  Administered 2019-06-13: 6 mg via SUBCUTANEOUS

## 2019-06-13 MED ORDER — HEPARIN SOD (PORK) LOCK FLUSH 100 UNIT/ML IV SOLN
500.0000 [IU] | Freq: Once | INTRAVENOUS | Status: AC | PRN
  Administered 2019-06-13: 500 [IU]
  Filled 2019-06-13: qty 5

## 2019-06-13 MED ORDER — PEGFILGRASTIM-CBQV 6 MG/0.6ML ~~LOC~~ SOSY
PREFILLED_SYRINGE | SUBCUTANEOUS | Status: AC
  Filled 2019-06-13: qty 0.6

## 2019-06-13 NOTE — Discharge Instructions (Addendum)
Your heart rate improved after taking your usual dose of metoprolol.  Make sure that you take it twice a day as prescribed.  You can take the second dose tonight around midnight, then resume usual morning and evening doses, tomorrow.

## 2019-06-13 NOTE — ED Notes (Signed)
Pt tolerating PO fluids with no N/V.

## 2019-06-13 NOTE — ED Triage Notes (Signed)
Pt is coming from the cancer center today. Pt came in to have his pump d/c'ed. While getting vitals nurses noticed that his HR was very high (132 bpm). While doing an EKG they noticed the pt was in Afib, pt has a hx of afib, and extensive hx of cardiac problems. Hx of colon cancer.

## 2019-06-13 NOTE — Telephone Encounter (Signed)
Scheduled per los. Called and left msg. Mailed printout  °

## 2019-06-13 NOTE — Progress Notes (Addendum)
  [place] OFFICE PROGRESS NOTE   Diagnosis: Colon cancer  INTERVAL HISTORY:   Mr. Maltos is seen today in an unscheduled visit.  He is here today for pump discontinuation.  He is noted to have a high heart rate.  He feels well.  No shortness of breath.  No chest pain.  No cough or fever.  Diarrhea is better since beginning Imodium.  Objective:  Vital signs in last 24 hours:  Blood pressure (!) 142/80, pulse (!) 132, temperature 98.7 F (37.1 C), resp. rate 20, SpO2 100 %.    Resp: Lungs clear bilaterally. Cardio: Irregular, tachycardic. GI: No hepatomegaly. Vascular: No leg edema.   Lab Results:  Lab Results  Component Value Date   WBC 3.5 (L) 06/11/2019   HGB 9.2 (L) 06/11/2019   HCT 30.2 (L) 06/11/2019   MCV 88.8 06/11/2019   PLT 181 06/11/2019   NEUTROABS 1.7 06/11/2019    Imaging:  No results found.  Medications: I have reviewed the patient's current medications.  Assessment/Plan: 1. 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 Colonoscopy 03/07/2019-nearly completely obstructing mass beginning at 16 cm from the anal verge, sigmoid colon polyp ? CTs 03/15/2019-wall thickening of the lower sigmoid colon, low rectal mass with significant luminal narrowing, diffuse liver metastases, borderline enlarged sigmoid mesocolon, iliac, and upper abdominal lymph nodes ? Elevated CEA ? 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 Cycle 1 FOLFOX 04/11/2019 ? Cycle 2 FOLFOX 05/28/2019  ? Cycle 3 FOLFOX 06/11/2019 2. 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 Diabetes 4. Coronary artery disease, status post coronary artery bypass surgery in 2017 5. Neutropenia following cycle 1  FOLFOX 6. Admission 04/28/2019 with COVID-19 infection 7. C. difficile colitis 04/28/2019 8. Atrial flutter during hospital admission March/April 2021-treated with a Cardizem drip, digoxin, and Eliquis anticoagulation. Converted to metoprolol at discharge.  Disposition: Mr. Lagrange appears stable.  He completed cycle 3 FOLFOX beginning 06/11/2019, here today for pump discontinuation.  Noted to have significant tachycardia.  He appears to be in rapid atrial fibrillation.  We are referring him to the emergency department.  Patient seen with Dr. [name].    Ned Card ANP/GNP-BC   06/13/2019  4:26 PM This was a shared visit with Ned Card.  Mr. Hustead was interviewed and examined.  He is now day 3 following FOLFOX.  He was here today for the pump disconnect and was noted to have tachycardia.  He has a variably irregular rhythm.  I suspect he has intermittent A. fib and SVT.  He reports feeling the best that he has in months.  We will refer him to the emergency room for management of the tachycardia.  Julieanne Manson, MD
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
## 2019-06-13 NOTE — ED Notes (Signed)
Per cancer center, had his chemo pump removed today- went into A-FIB, HR 120's-takes digoxin and metoprolol-ran out of Metoprolol yesterday-sending to ED for eval

## 2019-06-13 NOTE — Progress Notes (Signed)
@   26 Transported patient via W/C to ER-Recess B station. Report provided to RN.

## 2019-06-13 NOTE — Patient Instructions (Signed)

## 2019-06-14 ENCOUNTER — Telehealth: Payer: Self-pay

## 2019-06-14 NOTE — Telephone Encounter (Signed)
Spoke with pharmacy and directed them to pt's cardiologist for his cardiac prescriptions per Ned Card, NP.

## 2019-06-15 ENCOUNTER — Inpatient Hospital Stay: Payer: Medicare HMO

## 2019-06-18 NOTE — Progress Notes (Signed)
Cardiology Office Note:    Date:  06/19/2019   ID:  Clarence Nip., DOB [birth year], MRN [id number]  PCP:  Patient, No Pcp Per  Cardiologist:  Mertie Moores, MD   Electrophysiologist:  None   Referring MD: No ref. provider found   Chief Complaint:  Follow-up (AFib)    Patient Profile:    Clarence Phaneuf. is a 69 y.o. male with:   Coronary artery disease   S/p CABG in 2017 ([place])  Atrial Fib/Flutter  High risk for bleeding, no anticoagulation (ongoing rectal bleeding due to CA)  Chronic systolic CHF  Low EF in setting of AF with RVR - ? Tachy mediated   Diabetes mellitus    Thoracic Aortic Aneurysm   CT 04/2019: Asc Ao 4.5 cm   Hx of COVID-19  Throat/tongue CA  Metastatic Colorectal CA  1 cycle of FOLFOX >> developed severe neutropenia, COVID-19, CDiff colitis & AFlutter   C Diff colitis   Prior CV studies: Echocardiogram 04/28/2019 EF 40-45, mod LVH, mod reduced RVSF, ASc Ao 41 mm  History of Present Illness:    Clarence Andrews has metastatic colon CA.  After his 1st round of chemotherapy, he developed severe neutropenia, AFlutter with RVR, COVID-19 and C Diff colitis.  He was deemed a poor candidate for anticoagulation due to ongoing rectal bleeding.  He was last seen by Dr. Acie Fredrickson 05/24/2019.  His HR was in the 40s and his Diltiazem was DC'd.  He was seen in the ED 06/13/2019 for a rapid HR.  He was out of his Metoprolol.  His HR improved after being given Metoprolol in the ED.   He returns for follow up.  He is here alone.  He notes that he is feeling well.  His HR was in the 140s when he ran out of Metoprolol.  He is not having chest pain, significant shortness of breath, syncope, orthopnea, leg swelling.     Past Medical History:  Diagnosis Date  . Cancer ([place])   . CHF (congestive heart failure) ([place])   . Colitis   . COVID-19 04/29/2019  . Diabetes ([place])   . Paronychia of second toe of right foot   . Throat cancer ([place])     Current  Medications: Current Meds  Medication Sig  . Cholecalciferol (D3-1000 PO) Take 1,000 Units by mouth daily.   . Chromium-Cinnamon [phone] MCG-MG CAPS Take 2,000 mg by mouth daily.  . digoxin (LANOXIN) 0.125 MG tablet Take 1 tablet (0.125 mg total) by mouth daily.  . ferrous sulfate 325 (65 FE) MG EC tablet Take 325 mg by mouth daily with breakfast.  . glipiZIDE (GLUCOTROL) 10 MG tablet Take 10 mg by mouth daily before breakfast.  . levalbuterol (XOPENEX HFA) 45 MCG/ACT inhaler Inhale 2 puffs into the lungs every 6 (six) hours as needed for wheezing.  Marland Kitchen loperamide (IMODIUM) 2 MG capsule Take 1 capsule by mouth 4 (four) times daily as needed for diarrhea or loose stools.   . Magnesium 300 MG CAPS Take 600 mg by mouth daily.  . magnesium oxide (MAG-OX) 400 MG tablet Take 800 mg by mouth daily.   . metoprolol tartrate (LOPRESSOR) 100 MG tablet Take 1 tablet (100 mg total) by mouth 2 (two) times daily.  . Multiple Vitamin (MULTIVITAMIN) tablet Take 1 tablet by mouth daily.  . Turmeric 500 MG CAPS Take 2,000 mg by mouth daily. Taking 2 - 1000 mg tabs daily  . vancomycin (VANCOCIN HCL) 125 MG capsule Take one four times  daily x 10 days, then twice daily x 7 days, then one daily x 7 days, then one every 3 days x 4 weeks, then stop  . vitamin B-12 (CYANOCOBALAMIN) 500 MCG tablet Take 500 mcg by mouth daily.     Allergies:   Patient has no known allergies.   Social History   Tobacco Use  . Smoking status: Never Smoker  . Smokeless tobacco: Never Used  Substance Use Topics  . Alcohol use: Not Currently  . Drug use: Never     Family Hx: The patient's family history includes Breast cancer in his mother; Colon polyps in his father; Heart disease in his father. There is no history of Colon cancer, Esophageal cancer, Rectal cancer, or Stomach cancer.  ROS   EKGs/Labs/Other Test Reviewed:    EKG:  EKG is   ordered today.  The ekg ordered today demonstrates atrial fibrillation, HR 103, inf Q  waves, septal Q waves, QTc 408, no change from prior tracing.    Recent Labs: 04/28/2019: B Natriuretic Peptide 261.4 04/29/2019: TSH 0.760 05/28/2019: Magnesium 1.7 06/11/2019: ALT 15; BUN 18; Creatinine 1.42; Hemoglobin 9.2; Platelet Count 181; Potassium 4.1; Sodium 137   Recent Lipid Panel Lab Results  Component Value Date/Time   CHOL 144 04/29/2019 05:00 AM   TRIG 93 04/29/2019 05:00 AM   HDL 25 (L) 04/29/2019 05:00 AM   CHOLHDL 5.8 04/29/2019 05:00 AM   LDLCALC 100 (H) 04/29/2019 05:00 AM    Physical Exam:    VS:  BP 100/60   Pulse (!) 103   Ht 6\' 2"  (1.88 m)   Wt 230 lb (104.3 kg)   SpO2 97%   BMI 29.53 kg/m     Wt Readings from Last 3 Encounters:  06/19/19 230 lb (104.3 kg)  06/13/19 227 lb 8 oz (103.2 kg)  06/11/19 227 lb 8 oz (103.2 kg)     Constitutional:      Appearance: Healthy appearance. Not in distress.  Neck:     Thyroid: No thyromegaly.     Vascular: JVD normal.  Pulmonary:     Effort: Pulmonary effort is normal.     Breath sounds: No wheezing. No rales.  Cardiovascular:     Tachycardia present. Irregularly irregular rhythm. Normal S1. Normal S2.     Murmurs: There is no murmur.  Edema:    Peripheral edema absent.  Abdominal:     Palpations: Abdomen is soft. There is no hepatomegaly.  Skin:    General: Skin is warm and dry.  Neurological:     General: No focal deficit present.     Mental Status: Alert and oriented to person, place and time.     Cranial Nerves: Cranial nerves are intact.       ASSESSMENT & PLAN:    1. Persistent atrial fibrillation ([place]) He is not a candidate for anticoagulation due to bleeding risk.  HR 103 today.  HR control is fair.  I would like to see it < 100.  His BP is somewhat low.  He is not symptomatic with this.  His HR was too slow with Diltiazem.  I will continue his current dose of Metoprolol and Digoxin.  I will get a 24 hour Holter to get his Avg HR.  If his Avg HR is > 100, consider changing Metoprolol to 125  mg twice daily.  FU 6 weeks.   2. Chronic systolic CHF (congestive heart failure) (HCC) EF 45-50 by echocardiogram in 04/2019.  Question if this is due  to tachycardia.  Volume status appears stable.  His BP will not tolerate the addition of an ACE inhibitor or angiotensin receptor blocker.  Continue beta-blocker.    3. Thoracic aortic aneurysm without rupture (HCC) 4.5 cm by recent CT.  Consider repeat CT in 6-12 mos.   4. 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[place]) Continue follow up with oncology.  He remains on chemotherapy.    5. Coronary artery disease involving native coronary artery of native heart without angina pectoris Hx of CABG in 201 in [place].  He is not having angina.  He is off ASA due to bleeding risk.      Dispo:  Return in about 6 weeks (around 07/31/2019) for Routine Follow Up, w/ Dr. Acie Fredrickson, or Richardson Dopp, PA-C, in person.   Medication Adjustments/Labs and Tests Ordered: Current medicines are reviewed at length with the patient today.  Concerns regarding medicines are outlined above.  Tests Ordered: Orders Placed This Encounter  Procedures  . HOLTER MONITOR - 24 HOUR  . EKG 12-Lead   Medication Changes: No orders of the defined types were placed in this encounter.   Signed, Richardson Dopp, PA-C  06/19/2019 1:02 PM    [facility] [address], [place], [place]  [address] Phone: [phone]; Fax: [phone]

## 2019-06-19 ENCOUNTER — Encounter: Payer: Self-pay | Admitting: Physician Assistant

## 2019-06-19 ENCOUNTER — Other Ambulatory Visit: Payer: Self-pay

## 2019-06-19 ENCOUNTER — Ambulatory Visit: Payer: Medicare HMO | Admitting: Physician Assistant

## 2019-06-19 VITALS — BP 100/60 | HR 103 | Ht 74.0 in | Wt 230.0 lb

## 2019-06-19 DIAGNOSIS — I4819 Other persistent atrial fibrillation: Secondary | ICD-10-CM | POA: Diagnosis not present

## 2019-06-19 DIAGNOSIS — C187 Malignant neoplasm of sigmoid colon: Secondary | ICD-10-CM

## 2019-06-19 DIAGNOSIS — I5022 Chronic systolic (congestive) heart failure: Secondary | ICD-10-CM | POA: Diagnosis not present

## 2019-06-19 DIAGNOSIS — I2510 Atherosclerotic heart disease of native coronary artery without angina pectoris: Secondary | ICD-10-CM

## 2019-06-19 DIAGNOSIS — I712 Thoracic aortic aneurysm, without rupture, unspecified: Secondary | ICD-10-CM

## 2019-06-19 NOTE — Patient Instructions (Addendum)
Medication Instructions:   Your physician recommends that you continue on your current medications as directed. Please refer to the Current Medication list given to you today.  *If you need a refill on your cardiac medications before your next appointment, please call your pharmacy*  Lab Work:  None ordered today  Testing/Procedures:  Your physician has recommended that you wear a holter monitor. Holter monitors are medical devices that record the heart's electrical activity. Doctors most often use these monitors to diagnose arrhythmias. Arrhythmias are problems with the speed or rhythm of the heartbeat. The monitor is a small, portable device. You can wear one while you do your normal daily activities. This is usually used to diagnose what is causing palpitations/syncope (passing out).  Follow-Up:  On 08/02/19 a t 11:20AM with Mertie Moores, MD

## 2019-06-19 NOTE — Progress Notes (Signed)
Pharmacist Chemotherapy Monitoring - Follow Up Assessment    I verify that I have reviewed each item in the below checklist:  . Regimen for the patient is scheduled for the appropriate day and plan matches scheduled date. Marland Kitchen Appropriate non-routine labs are ordered dependent on drug ordered. . If applicable, additional medications reviewed and ordered per protocol based on lifetime cumulative doses and/or treatment regimen.   Plan for follow-up and/or issues identified: No . I-vent associated with next due treatment: No . MD and/or nursing notified: No  Jaeleen Inzunza D 06/19/2019 3:55 PM

## 2019-06-20 ENCOUNTER — Telehealth: Payer: Self-pay | Admitting: Physician Assistant

## 2019-06-20 NOTE — Telephone Encounter (Signed)
Please call patient. Reviewed with Dr. Acie Fredrickson. We want to start him on a lower dose of the Diltiazem.  He was on Diltiazem CD 120 mg once daily previously but had to stop due to low HR. We also need to have him follow his BP.   PLAN:  1. Start Diltiazem 30 mg twice daily (every 12 hours) 2. Wear 24 hr monitor as planned 3. If he does not have a BP cuff, please provide him with one of ours 4. Monitor BP - 3-4 times a week at least >> bring list of BPs to next appt. 5. Continue current dose of metoprolol and digoxin.  Richardson Dopp, PA-C    06/20/2019 9:31 AM

## 2019-06-20 NOTE — Telephone Encounter (Signed)
I called and left patient a message to call back. 

## 2019-06-21 MED ORDER — DILTIAZEM HCL 30 MG PO TABS
30.0000 mg | ORAL_TABLET | Freq: Two times a day (BID) | ORAL | 3 refills | Status: DC
Start: 2019-06-21 — End: 2019-07-24

## 2019-06-21 NOTE — Telephone Encounter (Signed)
I called and spoke with patient, he is aware of recommendations per Nicki Reaper [place]/Dr. Nahser. He will monitor blood pressure and wear heart rate monitor. Diltiazem 30 mg prescription sent to pharmacy. Patient will bring blood pressure readings to next office visit.

## 2019-06-23 ENCOUNTER — Other Ambulatory Visit: Payer: Self-pay | Admitting: Oncology

## 2019-06-24 NOTE — Progress Notes (Addendum)
[place]   [phone] [phone]   Clinic Follow up Note   Patient Care Team: Patient, No Pcp Per as PCP - General (General Practice) Nahser, Wonda Cheng, MD as PCP - Cardiology (Cardiology) Jonnie Finner, RN as Oncology Nurse Navigator 06/25/2019  CHIEF COMPLAINT: F/u colon cancer    INTERVAL HISTORY: Clarence Andrews returns for f/u and treatment as scheduled. He received 3rd cycle FOLFOX on 06/11/19. He was seen 06/13/19 with pump d/c and was found to be in Afib with RVR. He was sent to the ED where he was given metoprolol and stabilized. He had not taken his beta blocker that morning because he ran out. He continues digoxin. He was seen by cardiology as an outpatient and wore 24 hour holter monitor. He was instructed to start diltiazem 30 mg BID and continue metoprolol and digoxin, but he has not started diltiazem yet, he is under impression to start that when he gets another holter monitor which he has not received yet.   Today, he feels the best he has since diagnosis. Appetite and energy are adequate. He is out of bed all day. Cold sensitivity lasts 3 days, no significant residual neuropathy. Diarrhea stopped with imodium. Denies n/v/c or abdominal pain. Denies mucositis, fever, chills, cough, chest pain, palpitations, dizziness, edema.  MEDICAL HISTORY:  Past Medical History:  Diagnosis Date  . Cancer ([place])   . CHF (congestive heart failure) ([place])   . Colitis   . COVID-19 04/29/2019  . Diabetes ([place])   . Paronychia of second toe of right foot   . Throat cancer [facility])     SURGICAL HISTORY: Past Surgical History:  Procedure Laterality Date  . APPENDECTOMY  1970  . CORONARY ARTERY BYPASS GRAFT     5 vessel   . IR IMAGING GUIDED PORT INSERTION  04/04/2019  . TOOTH EXTRACTION      I have reviewed the social history and family history with the patient and they are unchanged from previous note.  ALLERGIES:  has No Known  Allergies.  MEDICATIONS:  Current Outpatient Medications  Medication Sig Dispense Refill  . Cholecalciferol (D3-1000 PO) Take 1,000 Units by mouth daily.     . Chromium-Cinnamon [phone] MCG-MG CAPS Take 2,000 mg by mouth daily.    . digoxin (LANOXIN) 0.125 MG tablet Take 1 tablet (0.125 mg total) by mouth daily. 30 tablet 1  . ferrous sulfate 325 (65 FE) MG EC tablet Take 325 mg by mouth daily with breakfast.    . glipiZIDE (GLUCOTROL) 10 MG tablet Take 10 mg by mouth daily before breakfast.    . levalbuterol (XOPENEX HFA) 45 MCG/ACT inhaler Inhale 2 puffs into the lungs every 6 (six) hours as needed for wheezing. 1 Inhaler 0  . loperamide (IMODIUM) 2 MG capsule Take 1 capsule by mouth 4 (four) times daily as needed for diarrhea or loose stools.     . Magnesium 300 MG CAPS Take 600 mg by mouth daily.    . magnesium oxide (MAG-OX) 400 MG tablet Take 800 mg by mouth daily.     . metoprolol tartrate (LOPRESSOR) 100 MG tablet Take 1 tablet (100 mg total) by mouth 2 (two) times daily. 60 tablet 1  . Multiple Vitamin (MULTIVITAMIN) tablet Take 1 tablet by mouth daily.    . prochlorperazine (COMPAZINE) 10 MG tablet Take 1 tablet (10 mg total) by mouth every 6 (six) hours as needed for nausea or vomiting. 30 tablet 2  . Turmeric 500 MG  CAPS Take 2,000 mg by mouth daily. Taking 2 - 1000 mg tabs daily    . vancomycin (VANCOCIN HCL) 125 MG capsule Take one four times daily x 10 days, then twice daily x 7 days, then one daily x 7 days, then one every 3 days x 4 weeks, then stop 71 capsule 0  . vitamin B-12 (CYANOCOBALAMIN) 500 MCG tablet Take 500 mcg by mouth daily.    Marland Kitchen ascorbic acid (VITAMIN C) 500 MG tablet Take 1 tablet (500 mg total) by mouth daily. (Patient not taking: Reported on 06/13/2019)    . diltiazem (CARDIZEM) 30 MG tablet Take 1 tablet (30 mg total) by mouth 2 (two) times daily. (Patient not taking: Reported on 06/25/2019) 60 tablet 3  . lidocaine-prilocaine (EMLA) cream Apply to port site  1-2 hours prior to use (Patient not taking: Reported on 06/13/2019) 30 g 2   No current facility-administered medications for this visit.   Facility-Administered Medications Ordered in Other Visits  Medication Dose Route Frequency Provider Last Rate Last Admin  . fluorouracil (ADRUCIL) 4,850 mg in sodium chloride 0.9 % 53 mL chemo infusion  2,000 mg/m2 (Treatment Plan Recorded) Intravenous 1 day or 1 dose Betsy Coder B, MD      . heparin lock flush 100 unit/mL  500 Units Intracatheter Once PRN Ladell Pier, MD      . leucovorin 972 mg in dextrose 5 % 250 mL infusion  400 mg/m2 (Treatment Plan Recorded) Intravenous Once Ladell Pier, MD      . oxaliplatin (ELOXATIN) 200 mg in dextrose 5 % 500 mL chemo infusion  83 mg/m2 (Treatment Plan Recorded) Intravenous Once Ladell Pier, MD      . sodium chloride flush (NS) 0.9 % injection 10 mL  10 mL Intracatheter PRN Ladell Pier, MD        PHYSICAL EXAMINATION: ECOG PERFORMANCE STATUS: 1 - Symptomatic but completely ambulatory  Vitals:   06/25/19 1157 06/25/19 1210  BP: (!) 117/94 125/78  Pulse: (!) 120   Resp:    Temp:  98.6 F (37 C)  SpO2:     Filed Weights   06/25/19 1155  Weight: 229 lb (103.9 kg)    GENERAL:alert, no distress and comfortable SKIN: no rash  EYES: sclera clear LUNGS: clear with normal breathing effort HEART: irregular rate & rhythm NEURO: alert & oriented x 3 with fluent speech, normal gait PAC without erythema Limited exam for COVID19 precautions and patient had no complaints   LABORATORY DATA:  I have reviewed the data as listed CBC Latest Ref Rng & Units 06/25/2019 06/11/2019 05/28/2019  WBC 4.0 - 10.5 K/uL 9.4 3.5(L) 6.7  Hemoglobin 13.0 - 17.0 g/dL 9.4(L) 9.2(L) 9.3(L)  Hematocrit 39.0 - 52.0 % 30.9(L) 30.2(L) 29.9(L)  Platelets 150 - 400 K/uL 126(L) 181 242     CMP Latest Ref Rng & Units 06/25/2019 06/11/2019 05/28/2019  Glucose 70 - 99 mg/dL 151(H) 244(H) 127(H)  BUN 8 - 23 mg/dL _0 Creatinine 0.61 - 1.24 mg/dL 1.42(H) 1.42(H) 1.22  Sodium 135 - 145 mmol/L 140 137 139  Potassium 3.5 - 5.1 mmol/L 4.4 4.1 4.2  Chloride 98 - 111 mmol/L 102 102 101  CO2 22 - 32 mmol/L _1 Calcium 8.9 - 10.3 mg/dL 9.1 8.9 9.0  Total Protein 6.5 - 8.1 g/dL 6.7 6.7 7.0  Total Bilirubin 0.3 - 1.2 mg/dL 0.5 0.4 0.6  Alkaline Phos 38 - 126 U/L 150(H) 123 119  AST 15 - 41 U/L _0 ALT 0 - 44 U/L _1 RADIOGRAPHIC STUDIES: I have personally reviewed the radiological images as listed and agreed with the findings in the report. No results found.   ASSESSMENT & PLAN:   1. 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 Colonoscopy 03/07/2019-nearly completely obstructing mass beginning at 16 cm from the anal verge, sigmoid colon polyp ? CTs 03/15/2019-wall thickening of the lower sigmoid colon, low rectal mass with significant luminal narrowing, diffuse liver metastases, borderline enlarged sigmoid mesocolon, iliac, and upper abdominal lymph nodes ? Elevated CEA ? 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 Cycle 1 FOLFOX 04/11/2019 ? Cycle 2 FOLFOX 05/28/2019 (Udenyca added) ? Cycle 3 FOLFOX 06/11/2019 ? Cycle 4 FOLFOX 06/25/2019  2. 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 Diabetes 4. Coronary artery disease, status post coronary artery bypass surgery in 2017 5. Neutropenia following cycle 1 FOLFOX 6. Admission 04/28/2019 with COVID-19 infection 7. C. difficile colitis 04/28/2019 8. Atrial flutter during hospital admission March/April 2021-treated with a Cardizem drip, digoxin, and Eliquis anticoagulation. Converted to metoprolol at discharge.  Disposition:  Mr. Campas appears stable. He  completed 3 cycles of FOLFOX. He tolerates well with mild cold sensitivity. His diarrhea resolved. He is able to recover well. CBC and CMP reviewed, PLT 126K. He has mild renal insufficiency Scr 1.42 which is stable. The CEA decreased from last cycle. Physical exam shows him to be in Afib with an apical heart reate of 118. He is stable. We will follow up with Dr. Julious Payer office regarding his holter monitor and medication regimen.   He will proceed with cycle 4 FOLFOX with GCSF support. He will return for f/u and cycle 5 in 2 weeks. The plan was reviewed with Dr. [name] today.     Orders Placed This Encounter  Procedures  . CBC with Differential (Cancer Center Only)    Standing Status:   Future    Standing Expiration Date:   06/24/2020  . CMP ([facility] only)    Standing Status:   Future    Standing Expiration Date:   06/24/2020  . CEA (IN HOUSE-[facility])    Standing Status:   Future    Standing Expiration Date:   06/24/2020   All questions were answered. The patient knows to call the clinic with any problems, questions or concerns. No barriers to learning were detected.     Clarence Feeling, NP 06/25/19

## 2019-06-25 ENCOUNTER — Telehealth: Payer: Self-pay

## 2019-06-25 ENCOUNTER — Inpatient Hospital Stay: Payer: Medicare HMO | Admitting: Nurse Practitioner

## 2019-06-25 ENCOUNTER — Telehealth: Payer: Self-pay | Admitting: Physician Assistant

## 2019-06-25 ENCOUNTER — Other Ambulatory Visit: Payer: Self-pay

## 2019-06-25 ENCOUNTER — Encounter: Payer: Self-pay | Admitting: Nurse Practitioner

## 2019-06-25 ENCOUNTER — Inpatient Hospital Stay: Payer: Medicare HMO

## 2019-06-25 VITALS — BP 125/78 | HR 120 | Temp 98.6°F | Resp 18 | Ht 74.0 in | Wt 229.0 lb

## 2019-06-25 DIAGNOSIS — Z5189 Encounter for other specified aftercare: Secondary | ICD-10-CM | POA: Diagnosis not present

## 2019-06-25 DIAGNOSIS — C186 Malignant neoplasm of descending colon: Secondary | ICD-10-CM

## 2019-06-25 DIAGNOSIS — R000 Tachycardia, unspecified: Secondary | ICD-10-CM | POA: Diagnosis not present

## 2019-06-25 DIAGNOSIS — I2510 Atherosclerotic heart disease of native coronary artery without angina pectoris: Secondary | ICD-10-CM | POA: Diagnosis not present

## 2019-06-25 DIAGNOSIS — C187 Malignant neoplasm of sigmoid colon: Secondary | ICD-10-CM

## 2019-06-25 DIAGNOSIS — Z95828 Presence of other vascular implants and grafts: Secondary | ICD-10-CM

## 2019-06-25 DIAGNOSIS — I4891 Unspecified atrial fibrillation: Secondary | ICD-10-CM | POA: Diagnosis not present

## 2019-06-25 DIAGNOSIS — Z5111 Encounter for antineoplastic chemotherapy: Secondary | ICD-10-CM | POA: Diagnosis not present

## 2019-06-25 DIAGNOSIS — E119 Type 2 diabetes mellitus without complications: Secondary | ICD-10-CM | POA: Diagnosis not present

## 2019-06-25 DIAGNOSIS — C19 Malignant neoplasm of rectosigmoid junction: Secondary | ICD-10-CM | POA: Diagnosis not present

## 2019-06-25 DIAGNOSIS — C787 Secondary malignant neoplasm of liver and intrahepatic bile duct: Secondary | ICD-10-CM | POA: Diagnosis not present

## 2019-06-25 DIAGNOSIS — D701 Agranulocytosis secondary to cancer chemotherapy: Secondary | ICD-10-CM | POA: Diagnosis not present

## 2019-06-25 LAB — CBC WITH DIFFERENTIAL (CANCER CENTER ONLY)
Abs Immature Granulocytes: 0.26 10*3/uL — ABNORMAL HIGH (ref 0.00–0.07)
Basophils Absolute: 0 10*3/uL (ref 0.0–0.1)
Basophils Relative: 0 %
Eosinophils Absolute: 0.1 10*3/uL (ref 0.0–0.5)
Eosinophils Relative: 1 %
HCT: 30.9 % — ABNORMAL LOW (ref 39.0–52.0)
Hemoglobin: 9.4 g/dL — ABNORMAL LOW (ref 13.0–17.0)
Immature Granulocytes: 3 %
Lymphocytes Relative: 15 %
Lymphs Abs: 1.4 10*3/uL (ref 0.7–4.0)
MCH: 27.4 pg (ref 26.0–34.0)
MCHC: 30.4 g/dL (ref 30.0–36.0)
MCV: 90.1 fL (ref 80.0–100.0)
Monocytes Absolute: 1.3 10*3/uL — ABNORMAL HIGH (ref 0.1–1.0)
Monocytes Relative: 14 %
Neutro Abs: 6.3 10*3/uL (ref 1.7–7.7)
Neutrophils Relative %: 67 %
Platelet Count: 126 10*3/uL — ABNORMAL LOW (ref 150–400)
RBC: 3.43 MIL/uL — ABNORMAL LOW (ref 4.22–5.81)
RDW: 18.6 % — ABNORMAL HIGH (ref 11.5–15.5)
WBC Count: 9.4 10*3/uL (ref 4.0–10.5)
nRBC: 0.5 % — ABNORMAL HIGH (ref 0.0–0.2)

## 2019-06-25 LAB — CMP (CANCER CENTER ONLY)
ALT: 11 U/L (ref 0–44)
AST: 20 U/L (ref 15–41)
Albumin: 3 g/dL — ABNORMAL LOW (ref 3.5–5.0)
Alkaline Phosphatase: 150 U/L — ABNORMAL HIGH (ref 38–126)
Anion gap: 7 (ref 5–15)
BUN: 18 mg/dL (ref 8–23)
CO2: 31 mmol/L (ref 22–32)
Calcium: 9.1 mg/dL (ref 8.9–10.3)
Chloride: 102 mmol/L (ref 98–111)
Creatinine: 1.42 mg/dL — ABNORMAL HIGH (ref 0.61–1.24)
GFR, Est AFR Am: 57 mL/min — ABNORMAL LOW (ref >60)
GFR, Estimated: 49 mL/min — ABNORMAL LOW (ref >60)
Glucose, Bld: 151 mg/dL — ABNORMAL HIGH (ref 70–99)
Potassium: 4.4 mmol/L (ref 3.5–5.1)
Sodium: 140 mmol/L (ref 135–145)
Total Bilirubin: 0.5 mg/dL (ref 0.3–1.2)
Total Protein: 6.7 g/dL (ref 6.5–8.1)

## 2019-06-25 LAB — CEA (IN HOUSE-CHCC): CEA (CHCC-In House): 234.9 ng/mL — ABNORMAL HIGH (ref 0.00–5.00)

## 2019-06-25 MED ORDER — LEUCOVORIN CALCIUM INJECTION 350 MG
400.0000 mg/m2 | Freq: Once | INTRAVENOUS | Status: AC
  Administered 2019-06-25: 972 mg via INTRAVENOUS
  Filled 2019-06-25: qty 48.6

## 2019-06-25 MED ORDER — SODIUM CHLORIDE 0.9% FLUSH
10.0000 mL | INTRAVENOUS | Status: DC | PRN
  Administered 2019-06-25: 10 mL via INTRAVENOUS
  Filled 2019-06-25: qty 10

## 2019-06-25 MED ORDER — SODIUM CHLORIDE 0.9 % IV SOLN
2000.0000 mg/m2 | INTRAVENOUS | Status: DC
  Administered 2019-06-25: 4850 mg via INTRAVENOUS
  Filled 2019-06-25: qty 97

## 2019-06-25 MED ORDER — PALONOSETRON HCL INJECTION 0.25 MG/5ML
0.2500 mg | Freq: Once | INTRAVENOUS | Status: AC
  Administered 2019-06-25: 0.25 mg via INTRAVENOUS

## 2019-06-25 MED ORDER — SODIUM CHLORIDE 0.9 % IV SOLN
10.0000 mg | Freq: Once | INTRAVENOUS | Status: AC
  Administered 2019-06-25: 10 mg via INTRAVENOUS
  Filled 2019-06-25: qty 10

## 2019-06-25 MED ORDER — PALONOSETRON HCL INJECTION 0.25 MG/5ML
INTRAVENOUS | Status: AC
  Filled 2019-06-25: qty 5

## 2019-06-25 MED ORDER — SODIUM CHLORIDE 0.9% FLUSH
10.0000 mL | INTRAVENOUS | Status: DC | PRN
  Filled 2019-06-25: qty 10

## 2019-06-25 MED ORDER — DEXTROSE 5 % IV SOLN
Freq: Once | INTRAVENOUS | Status: AC
  Filled 2019-06-25: qty 250

## 2019-06-25 MED ORDER — HEPARIN SOD (PORK) LOCK FLUSH 100 UNIT/ML IV SOLN
500.0000 [IU] | Freq: Once | INTRAVENOUS | Status: DC | PRN
  Filled 2019-06-25: qty 5

## 2019-06-25 MED ORDER — OXALIPLATIN CHEMO INJECTION 100 MG/20ML
83.0000 mg/m2 | Freq: Once | INTRAVENOUS | Status: AC
  Administered 2019-06-25: 200 mg via INTRAVENOUS
  Filled 2019-06-25: qty 40

## 2019-06-25 NOTE — Patient Instructions (Signed)
[facility] Discharge Instructions for Patients Receiving Chemotherapy  Today you received the following chemotherapy agents Oxaliplatin, Leucovorin, 5FU  To help prevent nausea and vomiting after your treatment, we encourage you to take your nausea medication as directed   If you develop nausea and vomiting that is not controlled by your nausea medication, call the clinic.   BELOW ARE SYMPTOMS THAT SHOULD BE REPORTED IMMEDIATELY:  *FEVER GREATER THAN 100.5 F  *CHILLS WITH OR WITHOUT FEVER  NAUSEA AND VOMITING THAT IS NOT CONTROLLED WITH YOUR NAUSEA MEDICATION  *UNUSUAL SHORTNESS OF BREATH  *UNUSUAL BRUISING OR BLEEDING  TENDERNESS IN MOUTH AND THROAT WITH OR WITHOUT PRESENCE OF ULCERS  *URINARY PROBLEMS  *BOWEL PROBLEMS  UNUSUAL RASH Items with * indicate a potential emergency and should be followed up as soon as possible.  Feel free to call the clinic should you have any questions or concerns. The clinic phone number is [phone].  Please show the CHEMO ALERT CARD at check-in to the Emergency Department and triage nurse.   

## 2019-06-25 NOTE — Telephone Encounter (Signed)
I called and spoke with patient, he is aware that he is supposed to start Diltiazem 30 mg twice a day. Patient verbalized understanding and will start medication.

## 2019-06-25 NOTE — Patient Instructions (Signed)

## 2019-06-25 NOTE — Telephone Encounter (Signed)
Pt c/o medication issue:  1. Name of Medication: diltiazem (CARDIZEM) 30 MG tablet  2. How are you currently taking this medication (dosage and times per day)?   3. Are you having a reaction (difficulty breathing--STAT)?   4. What is your medication issue? Jasmine from [facility] is calling stating the patient is wanting to know if Richardson Dopp wanted him to hold diltiazem or begin taking it. Please advise.

## 2019-06-25 NOTE — Telephone Encounter (Signed)
See phone note from 06/20/2019. Start Diltiazem 30 mg twice daily. Richardson Dopp, PA-C    06/25/2019 4:10 PM

## 2019-06-25 NOTE — Telephone Encounter (Signed)
Nutrition Assessment   Reason for Assessment:  Patient identified on Malnutrition Screening report for weight loss and poor appetite.    ASSESSMENT:  69 year old male with colorectal cancer 03/07/19.  Past medical history of tongue cancer in 2016, CAD, DM, c-diff 04/28/19, aflutter.  Patient receiving folfox.    Spoke with patient via phone to introduce self and service at [facility].  Patient reports that his appetite has improved.  Was having issues with diarrhea which is now improved.  Feeling better.  Reports typically has bagel with eggs or egg with ham and cheese. Lunch is usually a sandwich. Supper goes to [employer] and gets meat and several sides.    Denies any nutrition impact symptoms.   Medications: reviewed   Labs: reviewed   Anthropometrics:   Height: 74 inches Weight: 229 lb UBW: 240 lb 4/27 BMI: 29  5% weight loss in the last month   NUTRITION DIAGNOSIS: Inadequate oral intake related to cancer related treatment side effects as evidenced by 5% weight loss   INTERVENTION:  Encouraged patient to continue eating well-balanced diet including good sources of protein.  Patient instructed to reach out to RD if intake changes.     Next Visit: no follow-up planned  Ruther Ephraim B. Zenia Resides, [place], [place] Registered Dietitian [phone] (pager)

## 2019-06-25 NOTE — Progress Notes (Signed)
Ok to treat w/ HR = 118 - 120 bpm per Cira Rue, NP.  Kennith Center, Pharm.D., CPP 06/25/2019@1 :13 PM

## 2019-06-25 NOTE — Telephone Encounter (Signed)
TC per Cira Rue NP to  Dopp, [place] cardiology [phone]) to clarify if he wants him to begin diltiazem BID. patient thinks he is not supposed to start until he starts holter monitoring which he has not received yet. he is still on metoprolol and digoxin. HR 118 apical in clinic today. Left message with nurse who said she would ask Kathlen Mody, [place] and call me back [phone]. Waiting for call back

## 2019-06-26 ENCOUNTER — Telehealth: Payer: Self-pay

## 2019-06-26 NOTE — Telephone Encounter (Signed)
Received Voicemail from weaver PA's office about patient Diltiazem medication. They stated that they would be reaching out to the patient about this medication. Made Cira Rue NP aware of update.

## 2019-06-27 ENCOUNTER — Inpatient Hospital Stay: Payer: Medicare HMO

## 2019-06-27 VITALS — BP 104/64 | HR 93 | Temp 98.6°F | Resp 18

## 2019-06-27 DIAGNOSIS — C19 Malignant neoplasm of rectosigmoid junction: Secondary | ICD-10-CM | POA: Diagnosis not present

## 2019-06-27 DIAGNOSIS — E119 Type 2 diabetes mellitus without complications: Secondary | ICD-10-CM | POA: Diagnosis not present

## 2019-06-27 DIAGNOSIS — D701 Agranulocytosis secondary to cancer chemotherapy: Secondary | ICD-10-CM | POA: Diagnosis not present

## 2019-06-27 DIAGNOSIS — Z5189 Encounter for other specified aftercare: Secondary | ICD-10-CM | POA: Diagnosis not present

## 2019-06-27 DIAGNOSIS — I2510 Atherosclerotic heart disease of native coronary artery without angina pectoris: Secondary | ICD-10-CM | POA: Diagnosis not present

## 2019-06-27 DIAGNOSIS — C787 Secondary malignant neoplasm of liver and intrahepatic bile duct: Secondary | ICD-10-CM | POA: Diagnosis not present

## 2019-06-27 DIAGNOSIS — Z5111 Encounter for antineoplastic chemotherapy: Secondary | ICD-10-CM | POA: Diagnosis not present

## 2019-06-27 DIAGNOSIS — R000 Tachycardia, unspecified: Secondary | ICD-10-CM | POA: Diagnosis not present

## 2019-06-27 DIAGNOSIS — C187 Malignant neoplasm of sigmoid colon: Secondary | ICD-10-CM

## 2019-06-27 DIAGNOSIS — C186 Malignant neoplasm of descending colon: Secondary | ICD-10-CM

## 2019-06-27 DIAGNOSIS — I4891 Unspecified atrial fibrillation: Secondary | ICD-10-CM | POA: Diagnosis not present

## 2019-06-27 MED ORDER — HEPARIN SOD (PORK) LOCK FLUSH 100 UNIT/ML IV SOLN
500.0000 [IU] | Freq: Once | INTRAVENOUS | Status: AC | PRN
  Administered 2019-06-27: 500 [IU]
  Filled 2019-06-27: qty 5

## 2019-06-27 MED ORDER — SODIUM CHLORIDE 0.9% FLUSH
10.0000 mL | INTRAVENOUS | Status: DC | PRN
  Administered 2019-06-27: 10 mL
  Filled 2019-06-27: qty 10

## 2019-06-27 MED ORDER — PEGFILGRASTIM-CBQV 6 MG/0.6ML ~~LOC~~ SOSY
6.0000 mg | PREFILLED_SYRINGE | Freq: Once | SUBCUTANEOUS | Status: AC
  Administered 2019-06-27: 6 mg via SUBCUTANEOUS

## 2019-07-02 ENCOUNTER — Other Ambulatory Visit: Payer: Self-pay | Admitting: Oncology

## 2019-07-08 MED FILL — Dexamethasone Sodium Phosphate Inj 100 MG/10ML: INTRAMUSCULAR | Qty: 1 | Status: AC

## 2019-07-09 ENCOUNTER — Emergency Department (HOSPITAL_COMMUNITY): Payer: Medicare HMO

## 2019-07-09 ENCOUNTER — Encounter: Payer: Self-pay | Admitting: Oncology

## 2019-07-09 ENCOUNTER — Telehealth: Payer: Self-pay | Admitting: Cardiovascular Disease

## 2019-07-09 ENCOUNTER — Inpatient Hospital Stay (HOSPITAL_COMMUNITY)
Admission: EM | Admit: 2019-07-09 | Discharge: 2019-07-24 | DRG: 309 | Disposition: A | Discharge status: Home / Self Care | Payer: Medicare HMO | Attending: Family Medicine | Admitting: Family Medicine

## 2019-07-09 ENCOUNTER — Other Ambulatory Visit: Payer: Self-pay

## 2019-07-09 ENCOUNTER — Inpatient Hospital Stay: Payer: Medicare HMO

## 2019-07-09 ENCOUNTER — Encounter (HOSPITAL_COMMUNITY): Payer: Self-pay | Admitting: *Deleted

## 2019-07-09 ENCOUNTER — Inpatient Hospital Stay: Payer: Medicare HMO | Attending: Oncology | Admitting: Oncology

## 2019-07-09 VITALS — BP 98/77 | HR 130 | Temp 97.5°F | Resp 20 | Ht 74.0 in | Wt 224.6 lb

## 2019-07-09 DIAGNOSIS — C787 Secondary malignant neoplasm of liver and intrahepatic bile duct: Secondary | ICD-10-CM | POA: Diagnosis present

## 2019-07-09 DIAGNOSIS — I2510 Atherosclerotic heart disease of native coronary artery without angina pectoris: Secondary | ICD-10-CM

## 2019-07-09 DIAGNOSIS — Z8701 Personal history of pneumonia (recurrent): Secondary | ICD-10-CM | POA: Diagnosis not present

## 2019-07-09 DIAGNOSIS — K649 Unspecified hemorrhoids: Secondary | ICD-10-CM | POA: Diagnosis present

## 2019-07-09 DIAGNOSIS — Z8249 Family history of ischemic heart disease and other diseases of the circulatory system: Secondary | ICD-10-CM

## 2019-07-09 DIAGNOSIS — I4819 Other persistent atrial fibrillation: Principal | ICD-10-CM | POA: Diagnosis present

## 2019-07-09 DIAGNOSIS — U071 COVID-19: Secondary | ICD-10-CM | POA: Diagnosis not present

## 2019-07-09 DIAGNOSIS — Z803 Family history of malignant neoplasm of breast: Secondary | ICD-10-CM | POA: Diagnosis not present

## 2019-07-09 DIAGNOSIS — Z8616 Personal history of COVID-19: Secondary | ICD-10-CM | POA: Diagnosis not present

## 2019-07-09 DIAGNOSIS — N182 Chronic kidney disease, stage 2 (mild): Secondary | ICD-10-CM | POA: Diagnosis present

## 2019-07-09 DIAGNOSIS — D638 Anemia in other chronic diseases classified elsewhere: Secondary | ICD-10-CM | POA: Diagnosis present

## 2019-07-09 DIAGNOSIS — Z20822 Contact with and (suspected) exposure to covid-19: Secondary | ICD-10-CM | POA: Diagnosis not present

## 2019-07-09 DIAGNOSIS — E46 Unspecified protein-calorie malnutrition: Secondary | ICD-10-CM | POA: Diagnosis present

## 2019-07-09 DIAGNOSIS — Z7901 Long term (current) use of anticoagulants: Secondary | ICD-10-CM

## 2019-07-09 DIAGNOSIS — Z433 Encounter for attention to colostomy: Secondary | ICD-10-CM | POA: Diagnosis not present

## 2019-07-09 DIAGNOSIS — I4891 Unspecified atrial fibrillation: Secondary | ICD-10-CM | POA: Diagnosis not present

## 2019-07-09 DIAGNOSIS — D709 Neutropenia, unspecified: Secondary | ICD-10-CM | POA: Diagnosis present

## 2019-07-09 DIAGNOSIS — C187 Malignant neoplasm of sigmoid colon: Secondary | ICD-10-CM

## 2019-07-09 DIAGNOSIS — C186 Malignant neoplasm of descending colon: Secondary | ICD-10-CM | POA: Diagnosis not present

## 2019-07-09 DIAGNOSIS — K922 Gastrointestinal hemorrhage, unspecified: Secondary | ICD-10-CM | POA: Diagnosis present

## 2019-07-09 DIAGNOSIS — C188 Malignant neoplasm of overlapping sites of colon: Secondary | ICD-10-CM | POA: Diagnosis not present

## 2019-07-09 DIAGNOSIS — C189 Malignant neoplasm of colon, unspecified: Secondary | ICD-10-CM | POA: Diagnosis not present

## 2019-07-09 DIAGNOSIS — Z8371 Family history of colonic polyps: Secondary | ICD-10-CM

## 2019-07-09 DIAGNOSIS — Z85810 Personal history of malignant neoplasm of tongue: Secondary | ICD-10-CM

## 2019-07-09 DIAGNOSIS — Z9221 Personal history of antineoplastic chemotherapy: Secondary | ICD-10-CM | POA: Diagnosis not present

## 2019-07-09 DIAGNOSIS — K5939 Other megacolon: Secondary | ICD-10-CM | POA: Diagnosis not present

## 2019-07-09 DIAGNOSIS — I712 Thoracic aortic aneurysm, without rupture: Secondary | ICD-10-CM | POA: Diagnosis present

## 2019-07-09 DIAGNOSIS — R000 Tachycardia, unspecified: Secondary | ICD-10-CM | POA: Diagnosis not present

## 2019-07-09 DIAGNOSIS — Z951 Presence of aortocoronary bypass graft: Secondary | ICD-10-CM

## 2019-07-09 DIAGNOSIS — N179 Acute kidney failure, unspecified: Secondary | ICD-10-CM | POA: Diagnosis not present

## 2019-07-09 DIAGNOSIS — E861 Hypovolemia: Secondary | ICD-10-CM | POA: Diagnosis not present

## 2019-07-09 DIAGNOSIS — W57XXXA Bitten or stung by nonvenomous insect and other nonvenomous arthropods, initial encounter: Secondary | ICD-10-CM | POA: Diagnosis not present

## 2019-07-09 DIAGNOSIS — C20 Malignant neoplasm of rectum: Secondary | ICD-10-CM | POA: Diagnosis not present

## 2019-07-09 DIAGNOSIS — I5042 Chronic combined systolic (congestive) and diastolic (congestive) heart failure: Secondary | ICD-10-CM | POA: Diagnosis present

## 2019-07-09 DIAGNOSIS — R531 Weakness: Secondary | ICD-10-CM | POA: Diagnosis not present

## 2019-07-09 DIAGNOSIS — I5022 Chronic systolic (congestive) heart failure: Secondary | ICD-10-CM | POA: Diagnosis not present

## 2019-07-09 DIAGNOSIS — K56691 Other complete intestinal obstruction: Secondary | ICD-10-CM | POA: Diagnosis not present

## 2019-07-09 DIAGNOSIS — E1122 Type 2 diabetes mellitus with diabetic chronic kidney disease: Secondary | ICD-10-CM | POA: Diagnosis present

## 2019-07-09 DIAGNOSIS — E876 Hypokalemia: Secondary | ICD-10-CM | POA: Diagnosis not present

## 2019-07-09 DIAGNOSIS — I25810 Atherosclerosis of coronary artery bypass graft(s) without angina pectoris: Secondary | ICD-10-CM | POA: Diagnosis present

## 2019-07-09 DIAGNOSIS — I951 Orthostatic hypotension: Secondary | ICD-10-CM | POA: Diagnosis not present

## 2019-07-09 DIAGNOSIS — Z4682 Encounter for fitting and adjustment of non-vascular catheter: Secondary | ICD-10-CM | POA: Diagnosis not present

## 2019-07-09 DIAGNOSIS — J1282 Pneumonia due to coronavirus disease 2019: Secondary | ICD-10-CM | POA: Diagnosis not present

## 2019-07-09 DIAGNOSIS — E119 Type 2 diabetes mellitus without complications: Secondary | ICD-10-CM

## 2019-07-09 DIAGNOSIS — R109 Unspecified abdominal pain: Secondary | ICD-10-CM | POA: Diagnosis not present

## 2019-07-09 DIAGNOSIS — K56699 Other intestinal obstruction unspecified as to partial versus complete obstruction: Secondary | ICD-10-CM | POA: Diagnosis present

## 2019-07-09 DIAGNOSIS — I4892 Unspecified atrial flutter: Secondary | ICD-10-CM | POA: Diagnosis not present

## 2019-07-09 DIAGNOSIS — K56609 Unspecified intestinal obstruction, unspecified as to partial versus complete obstruction: Secondary | ICD-10-CM

## 2019-07-09 DIAGNOSIS — Z4659 Encounter for fitting and adjustment of other gastrointestinal appliance and device: Secondary | ICD-10-CM

## 2019-07-09 DIAGNOSIS — Z79899 Other long term (current) drug therapy: Secondary | ICD-10-CM | POA: Diagnosis not present

## 2019-07-09 DIAGNOSIS — K5669 Other partial intestinal obstruction: Secondary | ICD-10-CM | POA: Diagnosis not present

## 2019-07-09 LAB — CBC WITH DIFFERENTIAL (CANCER CENTER ONLY)
Abs Immature Granulocytes: 0.1 10*3/uL — ABNORMAL HIGH (ref 0.00–0.07)
Basophils Absolute: 0.1 10*3/uL (ref 0.0–0.1)
Basophils Relative: 1 %
Eosinophils Absolute: 0 10*3/uL (ref 0.0–0.5)
Eosinophils Relative: 0 %
HCT: 31.7 % — ABNORMAL LOW (ref 39.0–52.0)
Hemoglobin: 10.1 g/dL — ABNORMAL LOW (ref 13.0–17.0)
Immature Granulocytes: 1 %
Lymphocytes Relative: 7 %
Lymphs Abs: 0.6 10*3/uL — ABNORMAL LOW (ref 0.7–4.0)
MCH: 28.6 pg (ref 26.0–34.0)
MCHC: 31.9 g/dL (ref 30.0–36.0)
MCV: 89.8 fL (ref 80.0–100.0)
Monocytes Absolute: 1.5 10*3/uL — ABNORMAL HIGH (ref 0.1–1.0)
Monocytes Relative: 18 %
Neutro Abs: 6.2 10*3/uL (ref 1.7–7.7)
Neutrophils Relative %: 73 %
Platelet Count: 168 10*3/uL (ref 150–400)
RBC: 3.53 MIL/uL — ABNORMAL LOW (ref 4.22–5.81)
RDW: 20.2 % — ABNORMAL HIGH (ref 11.5–15.5)
WBC Count: 8.5 10*3/uL (ref 4.0–10.5)
nRBC: 0.5 % — ABNORMAL HIGH (ref 0.0–0.2)

## 2019-07-09 LAB — CMP (CANCER CENTER ONLY)
ALT: 13 U/L (ref 0–44)
AST: 23 U/L (ref 15–41)
Albumin: 3 g/dL — ABNORMAL LOW (ref 3.5–5.0)
Alkaline Phosphatase: 189 U/L — ABNORMAL HIGH (ref 38–126)
Anion gap: 13 (ref 5–15)
BUN: 29 mg/dL — ABNORMAL HIGH (ref 8–23)
CO2: 22 mmol/L (ref 22–32)
Calcium: 9 mg/dL (ref 8.9–10.3)
Chloride: 104 mmol/L (ref 98–111)
Creatinine: 1.39 mg/dL — ABNORMAL HIGH (ref 0.61–1.24)
GFR, Est AFR Am: 59 mL/min — ABNORMAL LOW (ref >60)
GFR, Estimated: 51 mL/min — ABNORMAL LOW (ref >60)
Glucose, Bld: 211 mg/dL — ABNORMAL HIGH (ref 70–99)
Potassium: 5 mmol/L (ref 3.5–5.1)
Sodium: 139 mmol/L (ref 135–145)
Total Bilirubin: 0.7 mg/dL (ref 0.3–1.2)
Total Protein: 6.7 g/dL (ref 6.5–8.1)

## 2019-07-09 LAB — MRSA PCR SCREENING: MRSA by PCR: NEGATIVE

## 2019-07-09 LAB — SARS CORONAVIRUS 2 BY RT PCR (HOSPITAL ORDER, PERFORMED IN ~~LOC~~ HOSPITAL LAB): SARS Coronavirus 2: NEGATIVE

## 2019-07-09 LAB — CEA (IN HOUSE-CHCC): CEA (CHCC-In House): 94.26 ng/mL — ABNORMAL HIGH (ref 0.00–5.00)

## 2019-07-09 LAB — DIGOXIN LEVEL: Digoxin Level: 0.6 ng/mL — ABNORMAL LOW (ref 0.8–2.0)

## 2019-07-09 LAB — GLUCOSE, CAPILLARY: Glucose-Capillary: 130 mg/dL — ABNORMAL HIGH (ref 70–99)

## 2019-07-09 LAB — TSH: TSH: 1.684 u[IU]/mL (ref 0.350–4.500)

## 2019-07-09 LAB — HEMOGLOBIN A1C
Hgb A1c MFr Bld: 7.8 % — ABNORMAL HIGH (ref 4.8–5.6)
Mean Plasma Glucose: 177.16 mg/dL

## 2019-07-09 LAB — CBG MONITORING, ED: Glucose-Capillary: 196 mg/dL — ABNORMAL HIGH (ref 70–99)

## 2019-07-09 MED ORDER — LEVALBUTEROL TARTRATE 45 MCG/ACT IN AERO: 2.0000 | INHALATION_SPRAY | Freq: Four times a day (QID) | RESPIRATORY_TRACT | Status: DC | PRN

## 2019-07-09 MED ORDER — INSULIN ASPART 100 UNIT/ML ~~LOC~~ SOLN
0.0000 [IU] | Freq: Every day | SUBCUTANEOUS | Status: DC
  Administered 2019-07-15 – 2019-07-22 (×5): 2 [IU] via SUBCUTANEOUS
  Filled 2019-07-09: qty 0.05

## 2019-07-09 MED ORDER — CHLORHEXIDINE GLUCONATE CLOTH 2 % EX PADS
6.0000 | MEDICATED_PAD | Freq: Every day | CUTANEOUS | Status: DC
  Administered 2019-07-10 – 2019-07-24 (×12): 6 via TOPICAL

## 2019-07-09 MED ORDER — PANTOPRAZOLE SODIUM 40 MG PO TBEC
40.0000 mg | DELAYED_RELEASE_TABLET | Freq: Every day | ORAL | Status: DC
  Administered 2019-07-09: 40 mg via ORAL
  Filled 2019-07-09: qty 1

## 2019-07-09 MED ORDER — SENNOSIDES-DOCUSATE SODIUM 8.6-50 MG PO TABS
2.0000 | ORAL_TABLET | Freq: Two times a day (BID) | ORAL | Status: DC
  Administered 2019-07-09: 2 via ORAL
  Filled 2019-07-09: qty 2

## 2019-07-09 MED ORDER — DILTIAZEM HCL-DEXTROSE 125-5 MG/125ML-% IV SOLN (PREMIX)
5.0000 mg/h | INTRAVENOUS | Status: DC
  Administered 2019-07-09: 5 mg/h via INTRAVENOUS
  Administered 2019-07-10: 15 mg/h via INTRAVENOUS
  Filled 2019-07-09 (×2): qty 125

## 2019-07-09 MED ORDER — LEVALBUTEROL HCL 0.63 MG/3ML IN NEBU: 0.6300 mg | INHALATION_SOLUTION | Freq: Four times a day (QID) | RESPIRATORY_TRACT | Status: DC | PRN

## 2019-07-09 MED ORDER — INSULIN ASPART 100 UNIT/ML ~~LOC~~ SOLN
0.0000 [IU] | Freq: Three times a day (TID) | SUBCUTANEOUS | Status: DC
  Administered 2019-07-10 (×3): 5 [IU] via SUBCUTANEOUS
  Administered 2019-07-13: 3 [IU] via SUBCUTANEOUS
  Administered 2019-07-13 (×2): 2 [IU] via SUBCUTANEOUS
  Administered 2019-07-14: 5 [IU] via SUBCUTANEOUS
  Administered 2019-07-14: 8 [IU] via SUBCUTANEOUS
  Administered 2019-07-14: 3 [IU] via SUBCUTANEOUS
  Administered 2019-07-15 (×3): 5 [IU] via SUBCUTANEOUS
  Administered 2019-07-16: 2 [IU] via SUBCUTANEOUS
  Administered 2019-07-16 (×2): 3 [IU] via SUBCUTANEOUS
  Administered 2019-07-17: 5 [IU] via SUBCUTANEOUS
  Administered 2019-07-17 (×2): 3 [IU] via SUBCUTANEOUS
  Administered 2019-07-18: 5 [IU] via SUBCUTANEOUS
  Administered 2019-07-18: 3 [IU] via SUBCUTANEOUS
  Administered 2019-07-18 – 2019-07-19 (×2): 5 [IU] via SUBCUTANEOUS
  Administered 2019-07-19: 3 [IU] via SUBCUTANEOUS
  Administered 2019-07-19: 5 [IU] via SUBCUTANEOUS
  Administered 2019-07-20: 8 [IU] via SUBCUTANEOUS
  Administered 2019-07-20 (×2): 5 [IU] via SUBCUTANEOUS
  Administered 2019-07-21: 3 [IU] via SUBCUTANEOUS
  Administered 2019-07-21 (×2): 5 [IU] via SUBCUTANEOUS
  Administered 2019-07-22 (×2): 3 [IU] via SUBCUTANEOUS
  Administered 2019-07-22: 5 [IU] via SUBCUTANEOUS
  Administered 2019-07-23 (×2): 3 [IU] via SUBCUTANEOUS
  Administered 2019-07-24 (×2): 2 [IU] via SUBCUTANEOUS

## 2019-07-09 MED ORDER — DILTIAZEM HCL 30 MG PO TABS
30.0000 mg | ORAL_TABLET | Freq: Two times a day (BID) | ORAL | Status: DC
  Filled 2019-07-09: qty 1

## 2019-07-09 MED ORDER — DILTIAZEM LOAD VIA INFUSION
10.0000 mg | Freq: Once | INTRAVENOUS | Status: AC
  Administered 2019-07-09: 10 mg via INTRAVENOUS
  Filled 2019-07-09: qty 10

## 2019-07-09 MED ORDER — POLYETHYLENE GLYCOL 3350 17 G PO PACK: 17.0000 g | PACK | Freq: Every day | ORAL | Status: DC | PRN

## 2019-07-09 MED ORDER — MORPHINE SULFATE (PF) 2 MG/ML IV SOLN
2.0000 mg | INTRAVENOUS | Status: AC | PRN
  Administered 2019-07-09 – 2019-07-10 (×2): 2 mg via INTRAVENOUS
  Filled 2019-07-09 (×2): qty 1

## 2019-07-09 MED ORDER — METOPROLOL TARTRATE 25 MG PO TABS: 100.0000 mg | ORAL_TABLET | Freq: Two times a day (BID) | ORAL | Status: DC

## 2019-07-09 MED ORDER — ONDANSETRON HCL 4 MG PO TABS: 4.0000 mg | ORAL_TABLET | Freq: Four times a day (QID) | ORAL | Status: DC | PRN

## 2019-07-09 MED ORDER — ALUM & MAG HYDROXIDE-SIMETH 200-200-20 MG/5ML PO SUSP
30.0000 mL | ORAL | Status: DC | PRN
  Administered 2019-07-09: 30 mL via ORAL
  Filled 2019-07-09 (×2): qty 30

## 2019-07-09 MED ORDER — FERROUS SULFATE 325 (65 FE) MG PO TABS: 325.0000 mg | ORAL_TABLET | Freq: Every day | ORAL | Status: DC

## 2019-07-09 MED ORDER — METOPROLOL TARTRATE 25 MG PO TABS
100.0000 mg | ORAL_TABLET | Freq: Two times a day (BID) | ORAL | Status: DC
  Administered 2019-07-09: 100 mg via ORAL
  Filled 2019-07-09 (×2): qty 4

## 2019-07-09 MED ORDER — DIGOXIN 125 MCG PO TABS: 0.1250 mg | ORAL_TABLET | Freq: Every day | ORAL | Status: DC

## 2019-07-09 MED ORDER — APIXABAN 5 MG PO TABS: 5.0000 mg | ORAL_TABLET | Freq: Two times a day (BID) | ORAL | Status: DC

## 2019-07-09 MED ORDER — DILTIAZEM HCL 30 MG PO TABS: 30.0000 mg | ORAL_TABLET | Freq: Two times a day (BID) | ORAL | Status: DC

## 2019-07-09 MED ORDER — ONDANSETRON HCL 4 MG/2ML IJ SOLN
4.0000 mg | Freq: Four times a day (QID) | INTRAMUSCULAR | Status: DC | PRN
  Administered 2019-07-09 – 2019-07-10 (×2): 4 mg via INTRAVENOUS
  Filled 2019-07-09 (×3): qty 2

## 2019-07-09 MED ORDER — CYANOCOBALAMIN 500 MCG PO TABS
500.0000 ug | ORAL_TABLET | Freq: Every day | ORAL | Status: DC
  Filled 2019-07-09 (×2): qty 1

## 2019-07-09 MED ORDER — VITAMIN D3 25 MCG (1000 UNIT) PO TABS: 1000.0000 [IU] | ORAL_TABLET | Freq: Every day | ORAL | Status: DC

## 2019-07-09 NOTE — ED Provider Notes (Signed)
Medical screening examination/treatment/procedure(s) were conducted as a shared visit with non-physician practitioner(s) and myself.  I personally evaluated the patient during the encounter.    ED ECG REPORT   Date: 07/09/2019  Rate: 104  Rhythm: sinus tachycardia  QRS Axis: normal  Intervals: normal  ST/T Wave abnormalities: normal  Conduction Disutrbances:first-degree A-V block   Narrative Interpretation:   Old EKG Reviewed: changes noted  I have personally reviewed the EKG tracing and agree with the computerized printout as noted.  69 year old male sent from cancer center due to atrial flutter.  Cardiology has been consulted and plan is to admit for further management   Lacretia Leigh, MD 07/09/19 1423

## 2019-07-09 NOTE — Telephone Encounter (Signed)
New Message   Verdis Frederickson a PA from the ED Is calling to speak with Dr Acie Fredrickson   Please call her back

## 2019-07-09 NOTE — ED Provider Notes (Addendum)
3:21 PM signout from [place] PA-C at shift change.  Patient currently awaiting disposition decision.  History of colon cancer, here from his oncology appointment where he was found to be in rapid atrial flutter despite taking calcium channel blocker and beta-blocker this morning.  Hospitalist and cardiology consulted.  BP (!) 132/96    Pulse (!) 126    Temp (!) 97.4 F (36.3 C) (Oral)    Resp 18    SpO2 98%    4:55 PM I evaluated Mr. O'Donnell at bedside. HR continues to be 130.  He looks well and is in no distress.  Lung sounds are clear.  IV Cardizem has been ordered by cardiology.  Dr. Sedonia Small involved.  Awaiting final decision on who will be primary admit team for patient.  BP (!) 120/92    Pulse (!) 130    Temp (!) 97.4 F (36.3 C) (Oral)    Resp (!) 21    SpO2 98%   5:51 PM I spoke with Dr. Reesa Chew, [facility], who will admit.   BP 119/82    Pulse 97    Temp (!) 97.4 F (36.3 C) (Oral)    Resp 13    SpO2 98%      Carlisle Cater, PA-C 07/09/19 1751    Maudie Flakes, MD 07/09/19 2342

## 2019-07-09 NOTE — ED Notes (Signed)
Primary RN has been tied up in other pt's rooms, leading to delay in report being called

## 2019-07-09 NOTE — ED Notes (Signed)
Urinal at bedside.  

## 2019-07-09 NOTE — ED Notes (Signed)
ED Provider at bedside. 

## 2019-07-09 NOTE — Telephone Encounter (Signed)
I have talked with Clarence Andrews in the emergency room. I think the patient needs to be admitted for rate control.  If he keeps a heart rate of 130 for any length of time he could develop congestive heart failure. He is previously been well controlled on oral diltiazem and metoprolol but now for some reason has persistent heart rate of 130. I suggest we use IV diltiazem to help slow his heart rate and then titrate up his oral diltiazem and/or titrate his metoprolol. We will need to make sure that he does not have any underlying infections.  He has known colon cancer.  He also has had some constipation recently.    Mertie Moores, MD  07/09/2019 3:08 PM    [facility] [place],  [address], [place]  [address] Phone: [phone]; Fax: [phone]

## 2019-07-09 NOTE — Progress Notes (Signed)
@   12:15 Transferred patient to ER room #4 in stable condition for management of abnormal ECG after Dr. [name] spoke with Dr. Cathie Olden. Assisted to room stretcher and placed call bell and cell phone in reach. Report given to [facility], RN and provided direct # in case further information is needed.

## 2019-07-09 NOTE — Telephone Encounter (Signed)
Dr. To Dr. Call

## 2019-07-09 NOTE — Consult Note (Addendum)
Cardiology Consultation:   Patient ID: Clarence Andrews. MRN: [id number]; DOB: 09-07-1950  Admit date: 07/09/2019 Date of Consult: 07/09/2019  Primary Care Provider: Patient, No Pcp Per Primary Cardiologist: Mertie Moores, MD  Primary Electrophysiologist:  None    Patient Profile:   Clarence Andrews. is a 69 y.o. male with a hx of ASCAD s/p CABG in 2017, PAF, chronic systolic CHF, DM, Thoracic aortic aneurysm, throat/tongue CA and metastatic colorectal CA who is being seen today for the evaluation of atrial fibrillation with RVR at the request of Dr. [name].  History of Present Illness:   Clarence Andrews is a 69yo male with a hx of ASCAD s/p remote CABG in [place] in 2017.  He has a hx of PAF/flutter but is not anticoagulated due to ongoing rectal bleeding from his CA.  He has chronic systolic CHF with an EF of 40-45% by echo 04/2019 and moderate LVH.  He also has moderately reduced RVF and small ascending aortic aneurysm measuring 81mm by echo.   He was hospitalized with COVID 19 PNA  in April and for over a week.  He was tachycardic at that time up to 140bpm.  He was started on IV Cardizem gtt but then developed HRs in the 40's and it was stopped and placed on BB and dig.  HR was controlled upon discharge on 05/04/2019.  He was also started on Eliquis 5mg  BID at that time for his afib.  Plan was for DCCV after being on anticoagulation for 3-4 weeks.   He was seen back by Dr. Acie Fredrickson post hospitalization and had been seen by Dr. [name] and was determined to be at high risk for bleeding and is now no longer on anticoagulation with rate control planned going forward. HR was controlled at that [place].  He was seen by Richardson Dopp, PA last month and was still tachycardic at 103bpm.  He had been on metoprolol  and dig but due to persistently elevated HR he was started on PO short acting Cardizem but did not start.   A 24 hour Holter was ordered to assess average heart rate which has not been done    Today he presented to oncology for cycle 5 of chemo after completing another cycle of FOLFOX on 2/87/8676 without complications except constipation.  He complained today of feeling weak but no CP or SOB.  He was noted in clinic to have a HR of 130bpm and BP 102/61mmHg and was sent to the ER for further evaluation where he was found to be in atrial flutter with RVR.  Cardiology is now asked to consult to help with management of rapid aflutter.   The patient denies any chest pain or pressure, SOB, PND, orthopnea, LE edema, dizziness or syncope.  His main complaints are constipation and weakness.  He is unaware that his heart is fast.   Past Medical History:  Diagnosis Date  . Cancer ([place])   . CHF (congestive heart failure) ([place])   . Colitis   . COVID-19 04/29/2019  . Diabetes ([place])   . Paronychia of second toe of right foot   . Throat cancer [facility])     Past Surgical History:  Procedure Laterality Date  . APPENDECTOMY  1970  . CORONARY ARTERY BYPASS GRAFT     5 vessel   . IR IMAGING GUIDED PORT INSERTION  04/04/2019  . TOOTH EXTRACTION       Home Medications:  Prior to Admission medications   Medication Sig Start Date End  Date Taking? Authorizing Provider  ascorbic acid (VITAMIN C) 500 MG tablet Take 1 tablet (500 mg total) by mouth daily. 05/05/19  Yes Eugenie Filler, MD  Cholecalciferol (D3-1000 PO) Take 1,000 Units by mouth daily.    Yes [provider]  Chromium-Cinnamon [phone] MCG-MG CAPS Take 2,000 mg by mouth daily.   Yes [provider]  digoxin (LANOXIN) 0.125 MG tablet Take 1 tablet (0.125 mg total) by mouth daily. 05/05/19  Yes Eugenie Filler, MD  ELIQUIS 5 MG TABS tablet Take 5 mg by mouth 2 (two) times daily. 06/03/19  Yes [provider]  ferrous sulfate 325 (65 FE) MG EC tablet Take 325 mg by mouth daily with breakfast.   Yes [provider]  glipiZIDE (GLUCOTROL) 10 MG tablet Take 10 mg by mouth daily before breakfast.   Yes  [provider]  levalbuterol (XOPENEX HFA) 45 MCG/ACT inhaler Inhale 2 puffs into the lungs every 6 (six) hours as needed for wheezing. 05/04/19  Yes Eugenie Filler, MD  loperamide (IMODIUM) 2 MG capsule Take 1 capsule by mouth 4 (four) times daily as needed for diarrhea or loose stools.    Yes [provider]  magnesium oxide (MAG-OX) 400 MG tablet Take 800 mg by mouth daily.    Yes [provider]  metoprolol tartrate (LOPRESSOR) 100 MG tablet Take 1 tablet (100 mg total) by mouth 2 (two) times daily. 05/04/19  Yes Eugenie Filler, MD  Turmeric 500 MG CAPS Take 2,000 mg by mouth daily. Taking 2 - 1000 mg tabs daily   Yes [provider]  vitamin B-12 (CYANOCOBALAMIN) 500 MCG tablet Take 500 mcg by mouth daily.   Yes [provider]  diltiazem (CARDIZEM) 30 MG tablet Take 1 tablet (30 mg total) by mouth 2 (two) times daily. Patient not taking: Reported on 06/25/2019 06/21/19   Richardson Dopp T, PA-C  lidocaine-prilocaine (EMLA) cream Apply to port site 1-2 hours prior to use Patient not taking: Reported on 06/13/2019 04/02/19   Owens Shark, NP  prochlorperazine (COMPAZINE) 10 MG tablet Take 1 tablet (10 mg total) by mouth every 6 (six) hours as needed for nausea or vomiting. Patient not taking: Reported on 07/09/2019 04/02/19   Owens Shark, NP    Inpatient Medications: Scheduled Meds:  Continuous Infusions:  PRN Meds:   Allergies:   No Known Allergies  Social History:   Social History   Socioeconomic History  . Marital status: Single    Spouse name: Not on file  . Number of children: Not on file  . Years of education: Not on file  . Highest education level: Not on file  Occupational History  . Not on file  Tobacco Use  . Smoking status: Never Smoker  . Smokeless tobacco: Never Used  Substance and Sexual Activity  . Alcohol use: Not Currently  . Drug use: Never  . Sexual activity: Yes  Other Topics Concern  . Not on file   Social History Narrative  . Not on file   Social Determinants of Health   Financial Resource Strain:   . Difficulty of Paying Living Expenses:   Food Insecurity:   . Worried About Charity fundraiser in the Last Year:   . Arboriculturist in the Last Year:   Transportation Needs:   . Film/video editor (Medical):   Marland Kitchen Lack of Transportation (Non-Medical):   Physical Activity:   . Days of Exercise per Week:   .  Minutes of Exercise per Session:   Stress:   . Feeling of Stress :   Social Connections:   . Frequency of Communication with Friends and Family:   . Frequency of Social Gatherings with Friends and Family:   . Attends Religious Services:   . Active Member of Clubs or Organizations:   . Attends Archivist Meetings:   Marland Kitchen Marital Status:   Intimate Partner Violence:   . Fear of Current or Ex-Partner:   . Emotionally Abused:   Marland Kitchen Physically Abused:   . Sexually Abused:     Family History:    Family History  Problem Relation Age of Onset  . Breast cancer Mother   . Colon polyps Father   . Heart disease Father   . Colon cancer Neg Hx   . Esophageal cancer Neg Hx   . Rectal cancer Neg Hx   . Stomach cancer Neg Hx      ROS:  Please see the history of present illness.   All other ROS reviewed and negative.     Physical Exam/Data:   Vitals:   07/09/19 1252 07/09/19 1253 07/09/19 1400  BP: (!) 114/96  (!) 125/92  Pulse: 95  (!) 104  Resp: 17  19  Temp:  (!) 97.4 F (36.3 C)   TempSrc:  Oral   SpO2: 98%  93%   No intake or output data in the 24 hours ending 07/09/19 1422 Last 3 Weights 07/09/2019 06/25/2019 06/19/2019  Weight (lbs) 224 lb 9.6 oz 229 lb 230 lb  Weight (kg) 101.878 kg 103.874 kg 104.327 kg     There is no height or weight on file to calculate BMI.  General:  Well nourished, well developed, in no acute distress HEENT: normal Lymph: no adenopathy Neck: no JVD Endocrine:  No thryomegaly Vascular: No carotid bruits; FA pulses 2+  bilaterally without bruits  Cardiac:  normal S1, S2; regular and tachycardic; no murmur  Lungs:  clear to auscultation bilaterally, no wheezing, rhonchi or rales  Abd: soft, nontender, no hepatomegaly  Ext: no edema Musculoskeletal:  No deformities, BUE and BLE strength normal and equal Skin: warm and dry  Neuro:  CNs 2-12 intact, no focal abnormalities noted Psych:  Normal affect   EKG:  The EKG was personally reviewed and demonstrates: Atypical Atrial flutter with RVR at 130bpm with anterior and inferior infarcts and no ST changes Telemetry:  Telemetry was personally reviewed and demonstrates:  Atrial flutter with RVR  Relevant CV Studies: 2D echo 04/2019 IMPRESSIONS   1. Difficult to assess LV systolic function given tachycardia and poor  windows, but appears mildly to moderately reduced. Left ventricular  ejection fraction, by estimation, is 40 to 45%. Would consider repeat TTE  with contrast once heart rate better  controlled. There is moderate left ventricular hypertrophy. Left  ventricular diastolic parameters are indeterminate.  2. Right ventricle is poorly visualized, but function appears moderately  reduced  3. The mitral valve is normal in structure. No evidence of mitral valve  regurgitation.  4. The aortic valve is tricuspid. Aortic valve regurgitation is not  visualized. No aortic stenosis is present.  5. Aortic dilatation noted. 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  6. The inferior vena cava is dilated in size with <50% respiratory  variability, suggesting right atrial pressure of 15 mmHg.    Laboratory Data:  High Sensitivity Troponin:  No results for input(s): TROPONINIHS in the last 720 hours.   Chemistry  Recent Labs  Lab 07/09/19 0938  NA 139  K 5.0  CL 104  CO2 22  GLUCOSE 211*  BUN 29*  CREATININE 1.39*  CALCIUM 9.0  GFRNONAA 51*  GFRAA 59*  ANIONGAP 13    Recent Labs  Lab 07/09/19 0938  PROT 6.7  ALBUMIN 3.0*   AST 23  ALT 13  ALKPHOS 189*  BILITOT 0.7   Hematology Recent Labs  Lab 07/09/19 0938  WBC 8.5  RBC 3.53*  HGB 10.1*  HCT 31.7*  MCV 89.8  MCH 28.6  MCHC 31.9  RDW 20.2*  PLT 168   BNPNo results for input(s): BNP, PROBNP in the last 168 hours.  DDimer No results for input(s): DDIMER in the last 168 hours.   Radiology/Studies:  No results found.       Assessment and Plan:   1. Persistent atrial flutter/fibrillation now with RVR -he has a hx of atrial fibrillation but not had not been chronically anticoagulated despite a CHADS2VASC score of 4 due to ongoing GI bleeding from colorectal CA -he was started on Eliquis 5mg  BID in early April prior to d/c home after hospitalization for COVID but subsequently stopped by Dr. [name] due to to high bleeding risk -now admitted to ER with aflutter with HR currently in the 130's -continue Lopressor 100mg  BID and dig 0.125mg  daily -during his COVID 19 admission he was in aflutter with RVR and placed on IV Cardizem gtt but then had bradycardia and it was stopped.  -will try starting on IV Cardizem gtt as BP is good and if BP or HR drops then will have to stop -check Dig level -of note I spoke with Dr. [name] and he says that patient is very high risk for ongoing rectal bleeding >>the hospitalist started Eliquis despite this being stopped in April>>I will discontinue the Eliquis and place on SCD while in hospital  2.  ASCAD -s/p remote CABG -denies any anginal sx -continue Lopressor 100mg  BID -currently not on statin therapy -no ASA due to bleeding issues with colon  3.  Chronic systolic CHF -EF 01-75% on echo 04/2019 -? Tachy mediated -does not appear volume overloaded on exam today -he has not tolerated ACE or ARB in the past due to soft BP  4.  Malignant neoplasm of sigmoid colon -per oncology -S/P 4 cycles of FOLFOX  For questions or updates, please contact [place] Please consult [email or web address] for contact info  under   Signed, Fransico Him, MD  07/09/2019 2:22 PM

## 2019-07-09 NOTE — Progress Notes (Signed)
[facility] OFFICE PROGRESS NOTE   Diagnosis: Colon cancer  INTERVAL HISTORY:   Clarence Andrews completed another cycle of FOLFOX on 06/25/2019.  No nausea/vomiting, mouth sores, or diarrhea.  He reports constipation for the past 4 days.  The constipation is not relieved with MiraLAX.  No bleeding.  He feels "weak ".  No chest pain.  No cold sensitivity or peripheral numbness  Objective:  Vital signs in last 24 hours:  Blood pressure 102/79, pulse (!) 130, temperature (!) 97.5 F (36.4 C), temperature source Temporal, resp. rate 18, height _0  (1.88 m), weight 224 lb 9.6 oz (101.9 kg), SpO2 100 %.    HEENT: The mucous membranes are moist, no thrush or ulcers Resp: Lungs clear bilaterally Cardio: Regular rhythm, tachycardia GI: Soft and nontender, no hepatomegaly Vascular: No leg edema  Skin: Palms without erythema  Portacath/PICC-without erythema  Lab Results:  Lab Results  Component Value Date   WBC 8.5 07/09/2019   HGB 10.1 (L) 07/09/2019   HCT 31.7 (L) 07/09/2019   MCV 89.8 07/09/2019   PLT 168 07/09/2019   NEUTROABS 6.2 07/09/2019    CMP  Lab Results  Component Value Date   NA 139 07/09/2019   K 5.0 07/09/2019   CL 104 07/09/2019   CO2 22 07/09/2019   GLUCOSE 211 (H) 07/09/2019   BUN 29 (H) 07/09/2019   CREATININE 1.39 (H) 07/09/2019   CALCIUM 9.0 07/09/2019   PROT 6.7 07/09/2019   ALBUMIN 3.0 (L) 07/09/2019   AST 23 07/09/2019   ALT 13 07/09/2019   ALKPHOS 189 (H) 07/09/2019   BILITOT 0.7 07/09/2019   GFRNONAA 51 (L) 07/09/2019   GFRAA 59 (L) 07/09/2019    Lab Results  Component Value Date   CEA1 94.26 (H) 07/09/2019     Medications: I have reviewed the patient's current medications.   Assessment/Plan: 1. 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 Colonoscopy 03/07/2019-nearly completely obstructing mass beginning at 16 cm from the anal verge, sigmoid colon polyp ? CTs 03/15/2019-wall  thickening of the lower sigmoid colon, low rectal mass with significant luminal narrowing, diffuse liver metastases, borderline enlarged sigmoid mesocolon, iliac, and upper abdominal lymph nodes ? Elevated CEA ? 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 Cycle 1 FOLFOX 04/11/2019 ? Cycle 2 FOLFOX 05/28/2019  ? Cycle 3 FOLFOX 06/11/2019 ? Cycle 4 FOLFOX 06/25/2019 2. 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 Diabetes 4. Coronary artery disease, status post coronary artery bypass surgery in 2017 5. Neutropenia following cycle 1 FOLFOX 6. Admission 04/28/2019 with COVID-19 infection 7. C. difficile colitis 04/28/2019 8. Atrial flutter during hospital admission March/April 2021-treated with a Cardizem drip, digoxin, and Eliquis anticoagulation. Converted to metoprolol at discharge.   Disposition: Clarence Andrews has completed 4 cycles of FOLFOX.  He has tolerated the chemotherapy well.  The CEA is lower.  The plan was to complete a restaging CT evaluation after cycle 5.  He presents today for cycle 5 chemotherapy.  He again has tachycardia.  I reviewed the EKG with Dr. Acie Fredrickson.  Clarence Andrews appears to be in rapid atrial flutter.  He states that he has taken his metoprolol and diltiazem today.  Clarence Andrews will be referred to the emergency room for management of the tachycardia.  We will treat his constipation while in the  hospital.  We will consider proceeding with a restaging CT abdomen/pelvis depending on his status over the next few days.  Cycle 5 FOLFOX will be rescheduled.  Betsy Coder, MD  07/09/2019  11:14 AM

## 2019-07-09 NOTE — H&P (Signed)
History and Physical    Clarence Nip. KWI:[id number] DOB: [birth year] DOA: 07/09/2019  PCP: Patient, No Pcp Per Patient coming from: Oncology office  Chief Complaint: A. fib with RVR  HPI: Clarence Andrews. is a 69 y.o. male with medical history significant of CAD status post CABG in 2017 in [place], paroxysmal A. fib/flutter not on anticoagulation due to rectal bleeding, chronic systolic CHF, EF 24%, Covid back in April 2683 complicated by tachycardia, constipation, adenocarcinoma colorectal cancer with metastases on active chemo, DM2, recent C. difficile colitis in March 2021 sent to the hospital from oncology office for A. fib with RVR.  Patient states he took his regular medications this morning and presented to cancer center for his chemotherapy.  He was noted to be in A. fib with RVR.  Patient was asymptomatic but given his rate in 130s he was sent to the hospital for further care and management.  In the ER patient's heart rate was noted to be in 120s-130s, A. fib with RVR.  Cardiology team was consulted and he was started on Cardizem drip.  Per recent outpatient documentation, he was additionally supposed to start Cardizem 30 mg twice daily but has not started this yet as he was under the impression he was supposed to get Holter monitor.   Review of Systems: As per HPI otherwise 10 point review of systems negative.  Review of Systems Otherwise negative except as per HPI, including: General: Denies fever, chills, night sweats or unintended weight loss. Resp: Denies cough, wheezing, shortness of breath. Cardiac: Denies chest pain, palpitations, orthopnea, paroxysmal nocturnal dyspnea. GI: Denies abdominal pain, nausea, vomiting, diarrhea or constipation GU: Denies dysuria, frequency, hesitancy or incontinence MS: Denies muscle aches, joint pain or swelling Neuro: Denies headache, neurologic deficits (focal weakness, numbness, tingling), abnormal gait Psych: Denies anxiety,  depression, SI/HI/AVH Skin: Denies new rashes or lesions ID: Denies sick contacts, exotic exposures, travel  Past Medical History:  Diagnosis Date  . Cancer ([place])   . CHF (congestive heart failure) ([place])   . Colitis   . COVID-19 04/29/2019  . Diabetes ([place])   . Paronychia of second toe of right foot   . Throat cancer [facility])     Past Surgical History:  Procedure Laterality Date  . APPENDECTOMY  1970  . CORONARY ARTERY BYPASS GRAFT     5 vessel   . IR IMAGING GUIDED PORT INSERTION  04/04/2019  . TOOTH EXTRACTION      SOCIAL HISTORY:  reports that he has never smoked. He has never used smokeless tobacco. He reports previous alcohol use. He reports that he does not use drugs.  No Known Allergies  FAMILY HISTORY: Family History  Problem Relation Age of Onset  . Breast cancer Mother   . Colon polyps Father   . Heart disease Father   . Colon cancer Neg Hx   . Esophageal cancer Neg Hx   . Rectal cancer Neg Hx   . Stomach cancer Neg Hx      Prior to Admission medications   Medication Sig Start Date End Date Taking? Authorizing Provider  ascorbic acid (VITAMIN C) 500 MG tablet Take 1 tablet (500 mg total) by mouth daily. 05/05/19  Yes Eugenie Filler, MD  Cholecalciferol (D3-1000 PO) Take 1,000 Units by mouth daily.    Yes [provider]  Chromium-Cinnamon [phone] MCG-MG CAPS Take 2,000 mg by mouth daily.   Yes [provider]  digoxin (LANOXIN) 0.125 MG tablet Take 1 tablet (0.125 mg total)  by mouth daily. 05/05/19  Yes Eugenie Filler, MD  ELIQUIS 5 MG TABS tablet Take 5 mg by mouth 2 (two) times daily. 06/03/19  Yes [provider]  ferrous sulfate 325 (65 FE) MG EC tablet Take 325 mg by mouth daily with breakfast.   Yes [provider]  glipiZIDE (GLUCOTROL) 10 MG tablet Take 10 mg by mouth daily before breakfast.   Yes [provider]  levalbuterol (XOPENEX HFA) 45 MCG/ACT inhaler Inhale 2 puffs into the lungs every 6 (six)  hours as needed for wheezing. 05/04/19  Yes Eugenie Filler, MD  loperamide (IMODIUM) 2 MG capsule Take 1 capsule by mouth 4 (four) times daily as needed for diarrhea or loose stools.    Yes [provider]  magnesium oxide (MAG-OX) 400 MG tablet Take 800 mg by mouth daily.    Yes [provider]  metoprolol tartrate (LOPRESSOR) 100 MG tablet Take 1 tablet (100 mg total) by mouth 2 (two) times daily. 05/04/19  Yes Eugenie Filler, MD  Turmeric 500 MG CAPS Take 2,000 mg by mouth daily. Taking 2 - 1000 mg tabs daily   Yes [provider]  vitamin B-12 (CYANOCOBALAMIN) 500 MCG tablet Take 500 mcg by mouth daily.   Yes [provider]  diltiazem (CARDIZEM) 30 MG tablet Take 1 tablet (30 mg total) by mouth 2 (two) times daily. Patient not taking: Reported on 06/25/2019 06/21/19   Richardson Dopp T, PA-C  lidocaine-prilocaine (EMLA) cream Apply to port site 1-2 hours prior to use Patient not taking: Reported on 06/13/2019 04/02/19   Owens Shark, NP  prochlorperazine (COMPAZINE) 10 MG tablet Take 1 tablet (10 mg total) by mouth every 6 (six) hours as needed for nausea or vomiting. Patient not taking: Reported on 07/09/2019 04/02/19   Owens Shark, NP    Physical Exam: Vitals:   07/09/19 1445 07/09/19 1630 07/09/19 1730 07/09/19 1745  BP: (!) 132/96 (!) 120/92 119/82 111/72  Pulse: (!) 126 (!) 130 97   Resp: 18 (!) 21 13 19   Temp:      TempSrc:      SpO2: 98% 98% 98%       Constitutional: NAD, calm, comfortable Eyes: PERRL, lids and conjunctivae normal ENMT: Mucous membranes are moist. Posterior pharynx clear of any exudate or lesions.Normal dentition.  Neck: normal, supple, no masses, no thyromegaly Respiratory: clear to auscultation bilaterally, no wheezing, no crackles. Normal respiratory effort. No accessory muscle use.  Cardiovascular: Irregularly irregular rhythm.  No lower extremity edema. Abdomen: no tenderness, no masses palpated. No  hepatosplenomegaly. Bowel sounds positive.  Musculoskeletal: no clubbing / cyanosis. No joint deformity upper and lower extremities. Good ROM, no contractures. Normal muscle tone.  Skin: no rashes, lesions, ulcers. No induration Neurologic: CN 2-12 grossly intact. Sensation intact, DTR normal. Strength 5/5 in all 4.  Psychiatric: Normal judgment and insight. Alert and oriented x 3. Normal mood.  Right chest wall Chemo-Port noted   Labs on Admission: I have personally reviewed following labs and imaging studies  CBC: Recent Labs  Lab 07/09/19 0938  WBC 8.5  NEUTROABS 6.2  HGB 10.1*  HCT 31.7*  MCV 89.8  PLT 401   Basic Metabolic Panel: Recent Labs  Lab 07/09/19 0938  NA 139  K 5.0  CL 104  CO2 22  GLUCOSE 211*  BUN 29*  CREATININE 1.39*  CALCIUM 9.0   GFR: Estimated Creatinine Clearance: 62.1 mL/min (A) (by C-G formula based on SCr of 1.39  mg/dL (H)). Liver Function Tests: Recent Labs  Lab 07/09/19 0938  AST 23  ALT 13  ALKPHOS 189*  BILITOT 0.7  PROT 6.7  ALBUMIN 3.0*   No results for input(s): LIPASE, AMYLASE in the last 168 hours. No results for input(s): AMMONIA in the last 168 hours. Coagulation Profile: No results for input(s): INR, PROTIME in the last 168 hours. Cardiac Enzymes: No results for input(s): CKTOTAL, CKMB, CKMBINDEX, TROPONINI in the last 168 hours. BNP (last 3 results) No results for input(s): PROBNP in the last 8760 hours. HbA1C: No results for input(s): HGBA1C in the last 72 hours. CBG: Recent Labs  Lab 07/09/19 1424  GLUCAP 196*   Lipid Profile: No results for input(s): CHOL, HDL, LDLCALC, TRIG, CHOLHDL, LDLDIRECT in the last 72 hours. Thyroid Function Tests: No results for input(s): TSH, T4TOTAL, FREET4, T3FREE, THYROIDAB in the last 72 hours. Anemia Panel: No results for input(s): VITAMINB12, FOLATE, FERRITIN, TIBC, IRON, RETICCTPCT in the last 72 hours. Urine analysis:    Component Value Date/Time   COLORURINE YELLOW  04/28/2019 0134   APPEARANCEUR CLEAR 04/28/2019 0134   LABSPEC 1.006 04/28/2019 0134   PHURINE 6.0 04/28/2019 0134   GLUCOSEU 50 (A) 04/28/2019 0134   HGBUR NEGATIVE 04/28/2019 0134   BILIRUBINUR NEGATIVE 04/28/2019 0134   KETONESUR NEGATIVE 04/28/2019 0134   PROTEINUR NEGATIVE 04/28/2019 0134   NITRITE NEGATIVE 04/28/2019 0134   LEUKOCYTESUR NEGATIVE 04/28/2019 0134   Sepsis Labs: !!!!!!!!!!!!!!!!!!!!!!!!!!!!!!!!!!!!!!!!!!!! @LABRCNTIP (procalcitonin:4,lacticidven:4) ) Recent Results (from the past 240 hour(s))  SARS Coronavirus 2 by RT PCR (hospital order, performed in [place] hospital lab) Nasopharyngeal Nasopharyngeal Swab     Status: None   Collection Time: 07/09/19 12:44 PM   Specimen: Nasopharyngeal Swab  Result Value Ref Range Status   SARS Coronavirus 2 NEGATIVE NEGATIVE Final    Comment: (NOTE) SARS-CoV-2 target nucleic acids are NOT DETECTED. The SARS-CoV-2 RNA is generally detectable in upper and lower respiratory specimens during the acute phase of infection. The lowest concentration of SARS-CoV-2 viral copies this assay can detect is 250 copies / mL. A negative result does not preclude SARS-CoV-2 infection and should not be used as the sole basis for treatment or other patient management decisions.  A negative result may occur with improper specimen collection / handling, submission of specimen other than nasopharyngeal swab, presence of viral mutation(s) within the areas targeted by this assay, and inadequate number of viral copies (<250 copies / mL). A negative result must be combined with clinical observations, patient history, and epidemiological information. Fact Sheet for Patients:   StrictlyIdeas.no Fact Sheet for Healthcare Providers: BankingDealers.co.za This test is not yet approved or cleared  by the [place] FDA and has been authorized for detection and/or diagnosis of SARS-CoV-2 by FDA under an  Emergency Use Authorization (EUA).  This EUA will remain in effect (meaning this test can be used) for the duration of the COVID-19 declaration under Section 564(b)(1) of the Act, 21 U.S.C. section 360bbb-3(b)(1), unless the authorization is terminated or revoked sooner. Performed at [facility], [place] [address], [place], [place] [address]      Radiological Exams on Admission: DG Chest 1 View  Result Date: 07/09/2019 CLINICAL DATA:  Tachycardia and diffuse abdominal pain. EXAM: CHEST  1 VIEW COMPARISON:  04/28/2019 FINDINGS: Previous median sternotomy. Power port inserted from a right jugular approach with tip at the SVC RA junction. Heart size is upper limits of normal with some left ventricular prominence. No acute mediastinal finding. The pulmonary vascularity  is normal. The lungs are clear. No infiltrate, collapse or effusion. IMPRESSION: No active disease. Electronically Signed   By: Nelson Chimes M.D.   On: 07/09/2019 14:21   DG Abdomen 1 View  Result Date: 07/09/2019 CLINICAL DATA:  Tachycardia.  Diffuse abdominal pain. EXAM: ABDOMEN - 1 VIEW COMPARISON:  None. FINDINGS: There is gas throughout small and large bowel most consistent with ileus. No focal dilatation to suggest obstruction. No free air. No abnormal calcifications. Ordinary degenerative changes affect the spine. IMPRESSION: Possible ileus pattern. No evidence of frank obstruction or focal lesion. Electronically Signed   By: Nelson Chimes M.D.   On: 07/09/2019 14:22     All images have been reviewed by me personally.  EKG: A. fib with RVR  Assessment/Plan Active Problems:   Atrial fibrillation with RVR (HCC)    Persistent atrial fibrillation with RVR, CHADsVASC 4  -Currently patient is on Cardizem drip.  Uptitrate as necessary -Lopressor 100 mg twice daily, digoxin. -Check digoxin levels -Per cardiology-patient's Eliquis was stopped due to rectal bleeding but patient tells me he took his Eliquis this  morning.  Also oncology note from today mentions patient is supposed to be on Eliquis.  This needs to be clarified between cardiology and oncology service.  Constipation -Bowel regimen.  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  Management per oncology team.  Fifth cycle will be rescheduled.  Congestive heart failure with reduced ejection fraction, 40%, class II -Echocardiogram 04/2019-EF 45%.  Appears to be euvolemic.  Continue Lopressor.  Previously has not tolerated ARB/ACE inhibitor  History of coronary artery disease status post CABG -Not on aspirin due to bleeding issues, does not appear to be on statin either.  Continue Lopressor.  Currently chest pain-free  Diabetes mellitus type 2, A1C 04/2019 - 9.8 -Continue glipizide.  Insulin sliding scale and Accu-Chek.  CKD stage II -From baseline creatinine of 1.2.  DVT prophylaxis: SCDs, anticoagulation needs will need to be clarified Code Status: Full code Family Communication: None Consults called: Cardiology Admission status: Inpatient admission for atrial fibrillation with RVR currently on Cardizem drip which will need to be titrated  Status is: Inpatient  Remains inpatient appropriate because:Hemodynamically unstable   Dispo: The patient is from: Home              Anticipated d/c is to: Home              Anticipated d/c date is: 2 days              Patient currently is not medically stable to d/c.  Patient is currently in atrial fibrillation with RVR requiring Cardizem drip titration.  Will need to adjust p.o. medications as appropriate.       Time Spent: 65 minutes.  >50% of the time was devoted to discussing the patients care, assessment, plan and disposition with  other care givers along with counseling the patient about the risks and benefits of treatment.    Clarence Andrews Arsenio Loader MD [facility]  If 7PM-7AM, please contact night-coverage   07/09/2019, 6:15 PM

## 2019-07-09 NOTE — ED Notes (Signed)
Coming from cancer center-states patient is in A flutter-history of colon cancer-cardiology is following patient-states he needs to be admitted for rate control-cardiologist will put in note-able to provide info regarding patient history etc

## 2019-07-09 NOTE — ED Triage Notes (Signed)
Transported from [facility] today-- history of colon cancer, was headed to cancer center today for treatment and staff performed an EKG that showed possible A-flutter with a  HR of 130. Patient presents with no complaints other than no bowel movement x 4 days. Other VSS.
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
## 2019-07-10 ENCOUNTER — Inpatient Hospital Stay (HOSPITAL_COMMUNITY): Payer: Medicare HMO

## 2019-07-10 DIAGNOSIS — I5022 Chronic systolic (congestive) heart failure: Secondary | ICD-10-CM

## 2019-07-10 DIAGNOSIS — K56609 Unspecified intestinal obstruction, unspecified as to partial versus complete obstruction: Secondary | ICD-10-CM

## 2019-07-10 DIAGNOSIS — C187 Malignant neoplasm of sigmoid colon: Secondary | ICD-10-CM

## 2019-07-10 DIAGNOSIS — I4891 Unspecified atrial fibrillation: Secondary | ICD-10-CM

## 2019-07-10 DIAGNOSIS — E119 Type 2 diabetes mellitus without complications: Secondary | ICD-10-CM

## 2019-07-10 LAB — GLUCOSE, CAPILLARY
Glucose-Capillary: 204 mg/dL — ABNORMAL HIGH (ref 70–99)
Glucose-Capillary: 234 mg/dL — ABNORMAL HIGH (ref 70–99)
Glucose-Capillary: 201 mg/dL — ABNORMAL HIGH (ref 70–99)
Glucose-Capillary: 131 mg/dL — ABNORMAL HIGH (ref 70–99)

## 2019-07-10 LAB — MAGNESIUM: Magnesium: 2.2 mg/dL (ref 1.7–2.4)

## 2019-07-10 LAB — COMPREHENSIVE METABOLIC PANEL
ALT: 14 U/L (ref 0–44)
AST: 18 U/L (ref 15–41)
Albumin: 3.2 g/dL — ABNORMAL LOW (ref 3.5–5.0)
Alkaline Phosphatase: 146 U/L — ABNORMAL HIGH (ref 38–126)
Anion gap: 10 (ref 5–15)
BUN: 37 mg/dL — ABNORMAL HIGH (ref 8–23)
CO2: 27 mmol/L (ref 22–32)
Calcium: 8.6 mg/dL — ABNORMAL LOW (ref 8.9–10.3)
Chloride: 100 mmol/L (ref 98–111)
Creatinine, Ser: 1.18 mg/dL (ref 0.61–1.24)
GFR calc Af Amer: >60 mL/min (ref >60)
GFR calc non Af Amer: >60 mL/min (ref >60)
Glucose, Bld: 181 mg/dL — ABNORMAL HIGH (ref 70–99)
Potassium: 4.2 mmol/L (ref 3.5–5.1)
Sodium: 137 mmol/L (ref 135–145)
Total Bilirubin: 0.9 mg/dL (ref 0.3–1.2)
Total Protein: 6.5 g/dL (ref 6.5–8.1)

## 2019-07-10 LAB — CBC
HCT: 31.8 % — ABNORMAL LOW (ref 39.0–52.0)
Hemoglobin: 9.9 g/dL — ABNORMAL LOW (ref 13.0–17.0)
MCH: 28.8 pg (ref 26.0–34.0)
MCHC: 31.1 g/dL (ref 30.0–36.0)
MCV: 92.4 fL (ref 80.0–100.0)
Platelets: 158 10*3/uL (ref 150–400)
RBC: 3.44 MIL/uL — ABNORMAL LOW (ref 4.22–5.81)
RDW: 20.3 % — ABNORMAL HIGH (ref 11.5–15.5)
WBC: 8.6 10*3/uL (ref 4.0–10.5)
nRBC: 0.3 % — ABNORMAL HIGH (ref 0.0–0.2)

## 2019-07-10 MED ORDER — SODIUM CHLORIDE 0.9% FLUSH
10.0000 mL | INTRAVENOUS | Status: DC | PRN
  Administered 2019-07-19 – 2019-07-24 (×4): 10 mL

## 2019-07-10 MED ORDER — PROMETHAZINE HCL 25 MG/ML IJ SOLN
12.5000 mg | Freq: Once | INTRAMUSCULAR | Status: AC
  Administered 2019-07-10: 12.5 mg via INTRAVENOUS
  Filled 2019-07-10: qty 1

## 2019-07-10 MED ORDER — DIGOXIN 0.25 MG/ML IJ SOLN
0.1250 mg | Freq: Every day | INTRAMUSCULAR | Status: DC
  Administered 2019-07-10 – 2019-07-17 (×8): 0.125 mg via INTRAVENOUS
  Filled 2019-07-10 (×8): qty 2

## 2019-07-10 MED ORDER — DILTIAZEM HCL-DEXTROSE 125-5 MG/125ML-% IV SOLN (PREMIX)
5.0000 mg/h | INTRAVENOUS | Status: DC
  Administered 2019-07-10 (×2): 15 mg/h via INTRAVENOUS
  Administered 2019-07-11: 10 mg/h via INTRAVENOUS
  Administered 2019-07-12: 5 mg/h via INTRAVENOUS
  Administered 2019-07-13: 10 mg/h via INTRAVENOUS
  Administered 2019-07-13: 7.5 mg/h via INTRAVENOUS
  Administered 2019-07-14: 15 mg/h via INTRAVENOUS
  Administered 2019-07-15: 10 mg/h via INTRAVENOUS
  Administered 2019-07-15 – 2019-07-16 (×2): 7.5 mg/h via INTRAVENOUS
  Administered 2019-07-17: 5 mg/h via INTRAVENOUS
  Filled 2019-07-10 (×11): qty 125

## 2019-07-10 MED ORDER — PHENOL 1.4 % MT LIQD
1.0000 | OROMUCOSAL | Status: DC | PRN
  Filled 2019-07-10: qty 177

## 2019-07-10 MED ORDER — IOHEXOL 9 MG/ML PO SOLN
ORAL | Status: AC
  Filled 2019-07-10: qty 1000

## 2019-07-10 MED ORDER — SODIUM CHLORIDE (PF) 0.9 % IJ SOLN
INTRAMUSCULAR | Status: AC
  Administered 2019-07-10: 10 mL
  Filled 2019-07-10: qty 50

## 2019-07-10 MED ORDER — AMIODARONE LOAD VIA INFUSION
150.0000 mg | Freq: Once | INTRAVENOUS | Status: AC
  Administered 2019-07-10: 150 mg via INTRAVENOUS
  Filled 2019-07-10: qty 83.34

## 2019-07-10 MED ORDER — SODIUM CHLORIDE 0.9% FLUSH
10.0000 mL | Freq: Two times a day (BID) | INTRAVENOUS | Status: DC
  Administered 2019-07-10 – 2019-07-14 (×7): 10 mL
  Administered 2019-07-14: 20 mL
  Administered 2019-07-15: 10 mL
  Administered 2019-07-15: 20 mL
  Administered 2019-07-16 – 2019-07-23 (×9): 10 mL

## 2019-07-10 MED ORDER — IOHEXOL 9 MG/ML PO SOLN: 1000.0000 mL | ORAL | Status: AC

## 2019-07-10 MED ORDER — AMIODARONE HCL IN DEXTROSE 360-4.14 MG/200ML-% IV SOLN
60.0000 mg/h | INTRAVENOUS | Status: DC
  Administered 2019-07-10: 60 mg/h via INTRAVENOUS
  Filled 2019-07-10: qty 200

## 2019-07-10 MED ORDER — METOPROLOL TARTRATE 5 MG/5ML IV SOLN
5.0000 mg | Freq: Four times a day (QID) | INTRAVENOUS | Status: DC
  Administered 2019-07-10 – 2019-07-15 (×21): 5 mg via INTRAVENOUS
  Filled 2019-07-10 (×21): qty 5

## 2019-07-10 MED ORDER — AMIODARONE HCL IN DEXTROSE 360-4.14 MG/200ML-% IV SOLN
30.0000 mg/h | INTRAVENOUS | Status: DC
  Filled 2019-07-10: qty 200

## 2019-07-10 MED ORDER — IOHEXOL 300 MG/ML  SOLN
100.0000 mL | Freq: Once | INTRAMUSCULAR | Status: AC | PRN
  Administered 2019-07-10: 100 mL via INTRAVENOUS

## 2019-07-10 MED ORDER — SODIUM CHLORIDE 0.9 % IV SOLN: INTRAVENOUS | Status: DC

## 2019-07-10 NOTE — Progress Notes (Signed)
This RN has received bedside report from previous RN.   Pt appears to be alert and oriented. He is complaining of general abdominal pain. Abdomen is round and tight, hyperactive sounds. Pain is 8/10 according to the patient. HR maintains in 110's on Cardizem gtt.   MD made aware of pain. Morphine and CT scan have been ordered. Pt proceeded to vomit. Morphine and Zofran administered STAT. Pt symptoms decrease. HR increased momentarily to 130s. Will wait to hear from CT imaging to coordinate with them.

## 2019-07-10 NOTE — Progress Notes (Signed)
Patient still experiencing episodes of N/V. Patient states he feels too nauseous to take PO meds. Patient currently prescribed Lopressor 100mg  BID and digoxin 0.125 daily by cardiology. Dr. Radford Pax with cardiology informed of patient N/V and CT Abd results. Order given to switch patient back to Cardizem IV from Amiodarone, discontinue PO lopressor and digoxin & switch patient to IV Lopressor 5mg  every 6 hours and IV digoxin 0.125mg  IV daily.

## 2019-07-10 NOTE — Progress Notes (Addendum)
Progress Note  Patient Name: Clarence Andrews. Date of Encounter: 07/10/2019  Primary Cardiologist: Mertie Moores, MD   Subjective   Remains in afib with RVR in the 130's.  Had problems with nausea and constipation.  Currently in CT  Inpatient Medications    Scheduled Meds: . sodium chloride (PF)      . Chlorhexidine Gluconate Cloth  6 each Topical Q0600  . cholecalciferol  1,000 Units Oral Daily  . digoxin  0.125 mg Oral Daily  . insulin aspart  0-15 Units Subcutaneous TID WC  . insulin aspart  0-5 Units Subcutaneous QHS  . iohexol      . metoprolol tartrate  100 mg Oral BID  . pantoprazole  40 mg Oral Daily  . senna-docusate  2 tablet Oral BID  . vitamin B-12  500 mcg Oral Daily   Continuous Infusions: . sodium chloride 75 mL/hr at 07/10/19 0609  . diltiazem (CARDIZEM) infusion 15 mg/hr (07/10/19 0737)   PRN Meds: alum & mag hydroxide-simeth, levalbuterol, ondansetron **OR** ondansetron (ZOFRAN) IV, polyethylene glycol   Vital Signs    Vitals:   07/10/19 0745 07/10/19 0800 07/10/19 0815 07/10/19 0900  BP: 124/85 116/81 115/81 112/84  Pulse: (!) 137 (!) 136 (!) 137 (!) 132  Resp: (!) 23 14 (!) 23 (!) 23  Temp:  98.1 F (36.7 C)    TempSrc:  Oral    SpO2: 93% 93% 92% 96%    Intake/Output Summary (Last 24 hours) at 07/10/2019 1008 Last data filed at 07/10/2019 0643 Gross per 24 hour  Intake 73.96 ml  Output 190 ml  Net -116.04 ml   There were no vitals filed for this visit.  Telemetry    Atrial fibrillation with RVR at 137bpm - Personally Reviewed  ECG    No new EKG to review - Personally Reviewed  Physical Exam   Cannot exam as he is in [place]  Lab 07/09/19 0938 07/10/19 0346  NA 139 137  K 5.0 4.2  CL 104 100  CO2 22 27  GLUCOSE 211* 181*  BUN 29* 37*  CREATININE 1.39* 1.18  CALCIUM 9.0 8.6*  PROT 6.7 6.5  ALBUMIN 3.0* 3.2*  AST 23 18  ALT 13 14  ALKPHOS 189* 146*  BILITOT 0.7 0.9  GFRNONAA 51*  >60  GFRAA 59* >60  ANIONGAP 13 10     Hematology Recent Labs  Lab 07/09/19 0938 07/10/19 0346  WBC 8.5 8.6  RBC 3.53* 3.44*  HGB 10.1* 9.9*  HCT 31.7* 31.8*  MCV 89.8 92.4  MCH 28.6 28.8  MCHC 31.9 31.1  RDW 20.2* 20.3*  PLT 168 158    Cardiac EnzymesNo results for input(s): TROPONINI in the last 168 hours. No results for input(s): TROPIPOC in the last 168 hours.   BNPNo results for input(s): BNP, PROBNP in the last 168 hours.   DDimer No results for input(s): DDIMER in the last 168 hours.   Radiology    DG Chest 1 View  Result Date: 07/09/2019 CLINICAL DATA:  Tachycardia and diffuse abdominal pain. EXAM: CHEST  1 VIEW COMPARISON:  04/28/2019 FINDINGS: Previous median sternotomy. Power port inserted from a right jugular approach with tip at the SVC RA junction. Heart size is upper limits of normal with some left ventricular prominence. No acute mediastinal finding. The pulmonary vascularity is normal. The lungs are clear. No infiltrate, collapse or effusion. IMPRESSION: No active disease. Electronically Signed   By: Elta Guadeloupe  Shogry M.D.   On: 07/09/2019 14:21   DG Abdomen 1 View  Result Date: 07/09/2019 CLINICAL DATA:  Tachycardia.  Diffuse abdominal pain. EXAM: ABDOMEN - 1 VIEW COMPARISON:  None. FINDINGS: There is gas throughout small and large bowel most consistent with ileus. No focal dilatation to suggest obstruction. No free air. No abnormal calcifications. Ordinary degenerative changes affect the spine. IMPRESSION: Possible ileus pattern. No evidence of frank obstruction or focal lesion. Electronically Signed   By: Nelson Chimes M.D.   On: 07/09/2019 14:22    Cardiac Studies   2D echo 04/28/2019 IMPRESSIONS   1. Difficult to assess LV systolic function given tachycardia and poor  windows, but appears mildly to moderately reduced. Left ventricular  ejection fraction, by estimation, is 40 to 45%. Would consider repeat TTE  with contrast once heart rate better   controlled. There is moderate left ventricular hypertrophy. Left  ventricular diastolic parameters are indeterminate.  2. Right ventricle is poorly visualized, but function appears moderately  reduced  3. The mitral valve is normal in structure. No evidence of mitral valve  regurgitation.  4. The aortic valve is tricuspid. Aortic valve regurgitation is not  visualized. No aortic stenosis is present.  5. Aortic dilatation noted. 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  6. The inferior vena cava is dilated in size with <50% respiratory  variability, suggesting right atrial pressure of 15 mmHg.   Patient Profile     69 y.o. male with a hx of ASCAD s/p CABG in 2017, PAF, chronic systolic CHF, DM, Thoracic aortic aneurysm, throat/tongue CA and metastatic colorectal CA who is being seen today for the evaluation of atrial fibrillation with RVR at the request of Dr. [name].   Assessment & Plan    1. Persistent atrial flutter/fibrillation now with RVR -he has a hx of atrial fibrillation but not had not been chronically anticoagulated despite a CHADS2VASC score of 4 due to ongoing GI bleeding from colorectal CA -he was started on Eliquis 5mg  BID in early April prior to d/c home after hospitalization for COVID but subsequently stopped by Dr. [name] due to to high bleeding risk -now admitted to ER with aflutter with HR currently in the 130's -continue Lopressor 100mg  BID and dig 0.125mg  daily -during his COVID 19 admission he was in aflutter with RVR and placed on IV Cardizem gtt but then had bradycardia and it was stopped.  -started on  IV Cardizem gtt yesterday but has not made any difference in HR -dig level ok at 0.6. -will stop IV Cardizem gtt and change to IV Amio to try to get HR controlled -of note I spoke with Dr. [name] and he says that patient is very high risk for ongoing rectal bleeding >>apparently the patient was still taking his Eliquis at home.   This has been stopped after conversation with Dr. [name] in regards to high risk of bleeding -Dr. [name] stated in notes today that potentially could try Coumadin but I am concerned that his INRs may be very erratic due to issues with nausea and may have higher bleeding risk on Coumadin if INRs are difficult to maintain in therapeutic range if he cannot maintain steady diet.  DOACs have lower bleeding risk than Coumadin but there is no reversal agent for Eliquis which would be the safest. -Will keep off anticoagulation for now since I think the risk of bleeding outweighs the benefits with known metastatic dz and chronic anemia  2.  ASCAD -  s/p remote CABG -denies any anginal sx -continue Lopressor 100mg  BID -currently not on statin therapy -no ASA due to bleeding issues with colon  3.  Chronic systolic CHF -EF 03-00% on echo 04/2019 -? Tachy mediated -does not appear volume overloaded on exam today -he has not tolerated ACE or ARB in the past due to soft BP  4.  Malignant neoplasm of sigmoid colon -per oncology -S/P 4 cycles of FOLFOX       For questions or updates, please contact [place] Please consult [email or web address] for contact info under Cardiology/STEMI.      Signed, Fransico Him, MD  07/10/2019, 10:08 AM

## 2019-07-10 NOTE — Consult Note (Addendum)
Referring Provider:  Dr. Lonny Prude, [facility] Primary Care Physician:  Patient, No Pcp Per Primary Gastroenterologist:  Dr. Loletha Carrow  Reason for Consultation:  Sigmoid colon obstruction, ? stent  HPI: Clarence Andrews. is a 69 y.o. male with medical history significant of CAD status post CABG in 2017 in [place], paroxysmal A. fib/flutter, chronic systolic CHF EF 81%, OFBPZ-02 + in April 2021, adenocarcinoma colorectal cancer with metastases on active chemo, DM2, recent C. difficile colitis in March 2021.  He was sent to the hospital from oncology office for A. fib with RVR when he arrived for his chemotherapy.  He was noted to be in A. fib with RVR.  Patient was asymptomatic but given his rate in 130s he was sent to the hospital for further care and management.  Overnight he started complaining of generalized abdominal pain and distention.  Began having episodes of emesis.  CT scan was performed and subsequently showed the following:  IMPRESSION: 1. 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 2. Multifocal hepatic metastatic disease which is stable or slightly improved from February 2021.  He has known metastatic adenocarcinoma of the colon to the liver and is on FOLFOX chemotherapy per Dr. [name].  Colonoscopy 03/2019 incomplete to descending colon - Preparation of the colon was poor (due to obstructing rectal mass). - Likely malignant partially obstructing tumor in the proximal rectum. Tattooed. Biopsied. - One 10 mm polyp in the sigmoid colon.  He says that he has not had a BM in 4-5 days.  Prior to that he was having diarrhea for 3 weeks and then prior to that he was using Miralax and having thin stools.  Has passed minimal to no flatus recently.  Feels much better now that NG tube is in today.   Past Medical History:  Diagnosis Date  . Cancer ([place])   . CHF (congestive heart failure) ([place])   .  Colitis   . COVID-19 04/29/2019  . Diabetes ([place])   . Paronychia of second toe of right foot   . Throat cancer [facility])     Past Surgical History:  Procedure Laterality Date  . APPENDECTOMY  1970  . CORONARY ARTERY BYPASS GRAFT     5 vessel   . IR IMAGING GUIDED PORT INSERTION  04/04/2019  . TOOTH EXTRACTION      Prior to Admission medications   Medication Sig Start Date End Date Taking? Authorizing Provider  ascorbic acid (VITAMIN C) 500 MG tablet Take 1 tablet (500 mg total) by mouth daily. 05/05/19  Yes Eugenie Filler, MD  Cholecalciferol (D3-1000 PO) Take 1,000 Units by mouth daily.    Yes [provider]  Chromium-Cinnamon [phone] MCG-MG CAPS Take 2,000 mg by mouth daily.   Yes [provider]  digoxin (LANOXIN) 0.125 MG tablet Take 1 tablet (0.125 mg total) by mouth daily. 05/05/19  Yes Eugenie Filler, MD  ELIQUIS 5 MG TABS tablet Take 5 mg by mouth 2 (two) times daily. 06/03/19  Yes [provider]  ferrous sulfate 325 (65 FE) MG EC tablet Take 325 mg by mouth daily with breakfast.   Yes [provider]  glipiZIDE (GLUCOTROL) 10 MG tablet Take 10 mg by mouth daily before breakfast.   Yes [provider]  levalbuterol (XOPENEX HFA) 45 MCG/ACT inhaler Inhale 2 puffs into the lungs every 6 (six) hours as needed for wheezing. 05/04/19  Yes Eugenie Filler, MD  loperamide (IMODIUM) 2 MG capsule Take 1 capsule by mouth 4 (four) times daily as needed for diarrhea or loose stools.    Yes [provider]  magnesium oxide (MAG-OX) 400 MG tablet Take 800 mg by mouth daily.    Yes [provider]  metoprolol tartrate (LOPRESSOR) 100 MG tablet Take 1 tablet (100 mg total) by mouth 2 (two) times daily. 05/04/19  Yes Eugenie Filler, MD  Turmeric 500 MG CAPS Take 2,000 mg by mouth daily. Taking 2 - 1000 mg tabs daily   Yes [provider]  vitamin B-12 (CYANOCOBALAMIN) 500 MCG tablet Take 500 mcg by mouth daily.   Yes  [provider]  diltiazem (CARDIZEM) 30 MG tablet Take 1 tablet (30 mg total) by mouth 2 (two) times daily. Patient not taking: Reported on 06/25/2019 06/21/19   Richardson Dopp T, PA-C  lidocaine-prilocaine (EMLA) cream Apply to port site 1-2 hours prior to use Patient not taking: Reported on 06/13/2019 04/02/19   Owens Shark, NP  prochlorperazine (COMPAZINE) 10 MG tablet Take 1 tablet (10 mg total) by mouth every 6 (six) hours as needed for nausea or vomiting. Patient not taking: Reported on 07/09/2019 04/02/19   Owens Shark, NP    Current Facility-Administered Medications  Medication Dose Route Frequency Provider Last Rate Last Admin  . 0.9 %  sodium chloride infusion   Intravenous Continuous Rise Patience, MD 75 mL/hr at 07/10/19 1100 Rate Verify at 07/10/19 1100  . alum & mag hydroxide-simeth (MAALOX/MYLANTA) 200-200-20 MG/5ML suspension 30 mL  30 mL Oral Q4H PRN Amin, Ankit Chirag, MD   30 mL at 07/09/19 1958  . Chlorhexidine Gluconate Cloth 2 % PADS 6 each  6 each Topical Q0600 Damita Lack, MD   6 each at 07/10/19 0430  . cholecalciferol (VITAMIN D) tablet 1,000 Units  1,000 Units Oral Daily Amin, Ankit Chirag, MD      . digoxin (LANOXIN) 0.25 MG/ML injection 0.125 mg  0.125 mg Intravenous Daily Turner, Traci R, MD   0.125 mg at 07/10/19 1308  . diltiazem (CARDIZEM) 125 mg in dextrose 5% 125 mL (1 mg/mL) infusion  5-15 mg/hr Intravenous Titrated Sueanne Margarita, MD 15 mL/hr at 07/10/19 1306 15 mg/hr at 07/10/19 1306  . insulin aspart (novoLOG) injection 0-15 Units  0-15 Units Subcutaneous TID WC Amin, Ankit Chirag, MD   5 Units at 07/10/19 1139  . insulin aspart (novoLOG) injection 0-5 Units  0-5 Units Subcutaneous QHS Amin, Ankit Chirag, MD      . iohexol (OMNIPAQUE) 9 MG/ML oral solution           . levalbuterol (XOPENEX) nebulizer solution 0.63 mg  0.63 mg Nebulization Q6H PRN Lacretia Leigh, MD      . metoprolol tartrate (LOPRESSOR) injection 5 mg  5 mg Intravenous  Q6H Turner, Traci R, MD   5 mg at 07/10/19 1310  . ondansetron (ZOFRAN) tablet 4 mg  4 mg Oral Q6H PRN Amin, Ankit Chirag, MD       Or  . ondansetron (ZOFRAN) injection 4 mg  4 mg Intravenous Q6H PRN Amin, Ankit Chirag, MD   4 mg at 07/10/19 0547  . pantoprazole (PROTONIX) EC tablet 40 mg  40 mg Oral Daily Rise Patience, MD   40 mg at 07/09/19 2113  . polyethylene glycol (MIRALAX / GLYCOLAX) packet 17 g  17 g Oral Daily PRN Amin, Ankit Chirag, MD      . senna-docusate (Senokot-S) tablet 2 tablet  2 tablet Oral BID Damita Lack, MD   2 tablet at 07/09/19 2109  . vitamin B-12 (CYANOCOBALAMIN) tablet 500 mcg  500 mcg Oral Daily Damita Lack, MD        Allergies as of 07/09/2019  . (No Known Allergies)    Family History  Problem Relation Age of Onset  . Breast cancer Mother   . Colon polyps Father   . Heart disease Father   . Colon cancer Neg Hx   . Esophageal cancer Neg Hx   . Rectal cancer Neg Hx   . Stomach cancer Neg Hx     Social History   Socioeconomic History  . Marital status: Single    Spouse name: Not on file  . Number of children: Not on file  . Years of education: Not on file  . Highest education level: Not on file  Occupational History  . Not on file  Tobacco Use  . Smoking status: Never Smoker  . Smokeless tobacco: Never Used  Substance and Sexual Activity  . Alcohol use: Not Currently  . Drug use: Never  . Sexual activity: Yes  Other Topics Concern  . Not on file  Social History Narrative  . Not on file   Social Determinants of Health   Financial Resource Strain:   . Difficulty of Paying Living Expenses:   Food Insecurity:   . Worried About Charity fundraiser in the Last Year:   . Arboriculturist in the Last Year:   Transportation Needs:   . Film/video editor (Medical):   Marland Kitchen Lack of Transportation (Non-Medical):   Physical Activity:   . Days of Exercise per Week:   . Minutes of Exercise per Session:   Stress:   . Feeling  of Stress :   Social Connections:   . Frequency of Communication with Friends and Family:   . Frequency of Social Gatherings with Friends and Family:   . Attends Religious Services:   . Active Member of Clubs or Organizations:   . Attends Archivist Meetings:   Marland Kitchen Marital Status:   Intimate Partner Violence:   . Fear of Current or Ex-Partner:   . Emotionally Abused:   Marland Kitchen Physically Abused:   . Sexually Abused:    Review of Systems: ROS is O/W negative except as mentioned in HPI.  Physical Exam: Vital signs in last 24 hours: Temp:  [97.4 F (36.3 C)-98.1 F (36.7 C)] 98.1 F (36.7 C) (06/09 1200) Pulse Rate:  [51-137] 65 (06/09 1400) Resp:  [12-27] 25 (06/09 1400) BP: (94-146)/(59-102) 121/84 (06/09 1400) SpO2:  [92 %-100 %] 95 % (06/09 1400) Last BM Date: 07/05/19 General:  Alert, Well-developed, well-nourished, pleasant and cooperative in NAD Head:  Normocephalic and atraumatic. Eyes:  Sclera clear, no icterus.  Conjunctiva pink. Ears:  Normal auditory acuity. Mouth:  No deformity or lesions.   Lungs:  Clear throughout to auscultation.  No wheezes, crackles, or rhonchi.  Heart:  Irregularly irregular. Abdomen:  Soft, non-distended.  BS present, but quiet.  Non-tender.      Msk:  Symmetrical without gross deformities. Pulses:  Normal pulses noted. Extremities:  Without clubbing or edema. Neurologic:  Alert and oriented x 4;  grossly normal neurologically. Skin:  Intact without significant lesions or rashes. Psych:  Alert and cooperative. Normal mood and affect.  Intake/Output from previous day: 06/08 0701 - 06/09 0700 In: 74 [I.V.:74] Out: 190 [Urine:40; Emesis/NG output:150] Intake/Output this shift: Total I/O In: 633.2 [  I.V.:633.2] Out: -   Lab Results: Recent Labs    07/09/19 0938 07/10/19 0346  WBC 8.5 8.6  HGB 10.1* 9.9*  HCT 31.7* 31.8*  PLT 168 158   BMET Recent Labs    07/09/19 0938 07/10/19 0346  NA 139 137  K 5.0 4.2  CL 104 100   CO2 22 27  GLUCOSE 211* 181*  BUN 29* 37*  CREATININE 1.39* 1.18  CALCIUM 9.0 8.6*   LFT Recent Labs    07/10/19 0346  PROT 6.5  ALBUMIN 3.2*  AST 18  ALT 14  ALKPHOS 146*  BILITOT 0.9   Studies/Results: DG Chest 1 View  Result Date: 07/09/2019 CLINICAL DATA:  Tachycardia and diffuse abdominal pain. EXAM: CHEST  1 VIEW COMPARISON:  04/28/2019 FINDINGS: Previous median sternotomy. Power port inserted from a right jugular approach with tip at the SVC RA junction. Heart size is upper limits of normal with some left ventricular prominence. No acute mediastinal finding. The pulmonary vascularity is normal. The lungs are clear. No infiltrate, collapse or effusion. IMPRESSION: No active disease. Electronically Signed   By: Nelson Chimes M.D.   On: 07/09/2019 14:21   DG Abdomen 1 View  Result Date: 07/09/2019 CLINICAL DATA:  Tachycardia.  Diffuse abdominal pain. EXAM: ABDOMEN - 1 VIEW COMPARISON:  None. FINDINGS: There is gas throughout small and large bowel most consistent with ileus. No focal dilatation to suggest obstruction. No free air. No abnormal calcifications. Ordinary degenerative changes affect the spine. IMPRESSION: Possible ileus pattern. No evidence of frank obstruction or focal lesion. Electronically Signed   By: Nelson Chimes M.D.   On: 07/09/2019 14:22   CT ABDOMEN PELVIS W CONTRAST  Result Date: 07/10/2019 CLINICAL DATA:  Rectal tumor. Abdominal distention. No bowel movement for days. EXAM: CT ABDOMEN AND PELVIS WITH CONTRAST TECHNIQUE: Multidetector CT imaging of the abdomen and pelvis was performed using the standard protocol following bolus administration of intravenous contrast. CONTRAST:  181mL OMNIPAQUE IOHEXOL 300 MG/ML  SOLN COMPARISON:  03/15/2019 FINDINGS: Lower chest: Atelectasis and patchy airspace opacity in the lower lungs. Atherosclerosis and postoperative heart. Hepatobiliary: Multifocal hepatic metastatic disease with ill-defined masses measuring up to 4 cm in the  posterior right liver, roughly 5 mm smaller than the before. Evidence of biliary obstruction or stone. Pancreas: Diffuse fatty atrophy with some preservation of density at the tail. There could be a cyst at the uncinate process, incidental in this setting. Spleen: Unremarkable. Adrenals/Urinary Tract: Negative adrenals. No hydronephrosis or stone. Simple bilateral renal cysts. Unremarkable bladder. Stomach/Bowel: Known sigmoid mass with severe luminal stenosis. The adjacent wall thickening is less thickened, but there is still spiculated density extending through the serosa into the mesentery. Above this narrowing there is generalized gas and fluid dilated bowel. Vascular/Lymphatic: No acute vascular finding. Decrease in size of IMA lymph nodes. Reproductive:No pathologic findings. Other: No ascites or pneumoperitoneum. Musculoskeletal: No acute abnormalities. IMPRESSION: 1. 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 2. Multifocal hepatic metastatic disease which is stable or slightly improved from February 2021. Electronically Signed   By: Monte Fantasia M.D.   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 Colonoscopy in February showed nearly completely obstructing mass beginning at 16 cm from the anal verge. On FOLFOX chemotherapy per Dr. [name], completed cycle 4 on  Jun 25, 2019 and supposed to receive cycle 5 today was held due to A. fib with RVR. *A. fib with RVR:  Apparently was still taking Eliquis at home, last dose ? 6/8. *C. difficile colitis in March 2021 *Coronary artery disease status post CABG in 2017 *Type 2 diabetes mellitus with hemoglobin A1c 9.8 in March 2021 * History of head neck cancer treated in 2016 in [place] with surgery, chemo,  radiation  PLAN: -Surgery is following. -Dr. Fuller Plan to review images/reports and discuss with colleagues to see if this is amenable to stenting.  Laban Emperor. Zehr  07/10/2019, 3:03 PM     Attending Physician Note   I have taken a history, examined the patient and reviewed the chart. I agree with the Advanced Practitioner's note, impression and recommendations.  Metastatic colon adenocarcinoma to the liver with obstructing proximal rectal mass Currently receiving FOLFOX chemotherapy Afib with RVR on Eliquis  * Continue bowel rest and NG suction * Hold Eliquis * Review with advanced endoscopy colleagues to determine if he is a candidate for colorectal stent placement - typically used as a bridge to surgery or palliation with incurable colorectal cancer  * Consider colostomy/diverting colostomy for mgmt   Lucio Edward, MD [facility] Gastroenterology

## 2019-07-10 NOTE — Progress Notes (Signed)
Pt having emesis episode again, HR shot up to 130s sustaining. Cardizem gtt increased accordingly. MD called for additional antiemetic. Phenergan ordered. Nausea decreased slightly.   This RN vocalized concerns for CT scan with contrast if pt continues to vomit. Pt at risk for aspiration due to sudden and multiple emesis episodes. MD aware. If patient begins to feel better, oral contrast will be started. CT was called, per CT patient should be scanned by morning time.

## 2019-07-10 NOTE — Progress Notes (Signed)
MD called regarding low urine output. Bladder scan reads 25 cc. Pt at risk for dehydration due to multiple emesis episodes. MD gave verbal order for IV NS @ 75 cc/hr.

## 2019-07-10 NOTE — Progress Notes (Signed)
PROGRESS NOTE    Clarence Andrews.  AOZ:[id number] DOB: [birth year] DOA: 07/09/2019 PCP: Patient, No Pcp Per   Brief Narrative: Clarence Dilone. is a 69 y.o. male with a history of CAD s/p CABG, persistent atrial fibrillation/flutter not on anticoagulation secondary to rectal bleeding, chronic systolic heart failure, previous COVID-19 infection, metastatic colon cancer. Patient presented from his oncology office secondary to atrial fibrillation with RVR. While admitted he developed significant nausea and vomiting with CT evidence of obstruction of his sigmoid from is colonic mass.   Assessment & Plan:   Principal Problem:   Atrial fibrillation with RVR ([place]) Active Problems:   Cancer of left colon (HCC)   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HCC)   Type 2 diabetes mellitus without complication (HCC)   CAD (coronary artery disease)   Persistent atrial flutter/fibrillation with RVR Patient with a history of atrial fibrillation as an outpatient on metoprolol 100 mg BID and digoxin 0.125 mg daily. Patient with persistent rates in the 130s while on Cardizem drip. Unable to administer oral medications secondary to NPO status for below problem. Not currently on anticoagulation secondary to rectal bleeding; per H&P, patient took Eliquis the morning of admission (6/8). -Cardiology recommendations:   Abdominal pain Emesis Patient with no bowel movement in 5 days. No prior history of obstruction but has had abdominal surgery in the past. History of adenocarcinoma of the colon as mentioned below. Abdominal x-ray (6/8) significant for possible ileus. CT abdomen/pelvis (6/9) obtained and significant for severe/obstructing malignant stricture at sigmoid colon. General surgery and GI consulted on 6/9. -General surgery recommendations: NG tube placement, GI consult for stent placement -GI recommendations: pending  Adenocarcinoma of the colon Metastatic adenocarcinoma to liver Recently diagnosed.  Currently follows with medical oncology, Dr. [name] and is on active chemotherapy treatment.  Chronic systolic heart failure EF of 40-45%. Patient is euvolemic on admission. On metoprolol (Lopressor) as an outpatient. Patient is not on diuretic therapy. Rales noted on exam but chest x-ray without evidence of effusion or edema.  -Watch fluid status; daily weights and strict in/out  History of CABG CAD Patient was previously on aspirin which was discontinued secondary to rectal bleeding.  Diabetes mellitus, type 2 Patient is on glipizide as an outpatient. Last hemoglobin A1C of 7.8%.  -Continue SSI  CKD stage II Baseline creatinine of 1.2.   DVT prophylaxis: SCDs Code Status:   Code Status: Full Code Family Communication: None at bedside Disposition Plan: Discharge date unknown at this time. Pending continued clinical workup/management of bowel obstruction and afib with RVR. Anticipate discharge home.   Consultants:   Cardiology (6/8)  General surgery (6/9)  Gastroenterology (6/9)  Procedures:   None  Antimicrobials:  None    Subjective: Emesis/nausea overnight. Feels palpitations. No significant abdominal pain.  Objective: Vitals:   07/10/19 0730 07/10/19 0745 07/10/19 0800 07/10/19 0815  BP: 94/70 124/85 116/81 115/81  Pulse: (!) 136 (!) 137 (!) 136 (!) 137  Resp: (!) 24 (!) 23 14 (!) 23  Temp:      TempSrc:      SpO2: 92% 93% 93% 92%    Intake/Output Summary (Last 24 hours) at 07/10/2019 0823 Last data filed at 07/10/2019 9629 Gross per 24 hour  Intake 73.96 ml  Output 190 ml  Net -116.04 ml   There were no vitals filed for this visit.  Examination:  General exam: Appears calm and comfortable Respiratory system: Moderate rales at LLL with some mild rales on RLL. No wheezing. Respiratory effort  normal. Cardiovascular system: S1 & S2 heard, Tachycardia with irregular rhythm. No murmurs, rubs, gallops or clicks. Gastrointestinal system: Abdomen is  distended, soft and nontender. Decreased bowel sounds heard. Central nervous system: Alert and oriented. No focal neurological deficits. Musculoskeletal: No calf tenderness Skin: No cyanosis. No rashes Psychiatry: Judgement and insight appear normal. Mood & affect appropriate.     Data Reviewed: I have personally reviewed following labs and imaging studies  CBC Lab Results  Component Value Date   WBC 8.6 07/10/2019   RBC 3.44 (L) 07/10/2019   HGB 9.9 (L) 07/10/2019   HCT 31.8 (L) 07/10/2019   MCV 92.4 07/10/2019   MCH 28.8 07/10/2019   PLT 158 07/10/2019   MCHC 31.1 07/10/2019   RDW 20.3 (H) 07/10/2019   LYMPHSABS 0.6 (L) 07/09/2019   MONOABS 1.5 (H) 07/09/2019   EOSABS 0.0 07/09/2019   BASOSABS 0.1 00/86/7619     Last metabolic panel Lab Results  Component Value Date   NA 137 07/10/2019   K 4.2 07/10/2019   CL 100 07/10/2019   CO2 27 07/10/2019   BUN 37 (H) 07/10/2019   CREATININE 1.18 07/10/2019   GLUCOSE 181 (H) 07/10/2019   GFRNONAA >60 07/10/2019   GFRAA >60 07/10/2019   CALCIUM 8.6 (L) 07/10/2019   PHOS 2.6 05/02/2019   PROT 6.5 07/10/2019   ALBUMIN 3.2 (L) 07/10/2019   BILITOT 0.9 07/10/2019   ALKPHOS 146 (H) 07/10/2019   AST 18 07/10/2019   ALT 14 07/10/2019   ANIONGAP 10 07/10/2019    CBG (last 3)  Recent Labs    07/09/19 1424 07/09/19 2107 07/10/19 0759  GLUCAP 196* 130* 204*     GFR: Estimated Creatinine Clearance: 73.2 mL/min (by C-G formula based on SCr of 1.18 mg/dL).  Coagulation Profile: No results for input(s): INR, PROTIME in the last 168 hours.  Recent Results (from the past 240 hour(s))  SARS Coronavirus 2 by RT PCR (hospital order, performed in [facility] hospital lab) Nasopharyngeal Nasopharyngeal Swab     Status: None   Collection Time: 07/09/19 12:44 PM   Specimen: Nasopharyngeal Swab  Result Value Ref Range Status   SARS Coronavirus 2 NEGATIVE NEGATIVE Final    Comment: (NOTE) SARS-CoV-2 target nucleic acids are NOT  DETECTED. The SARS-CoV-2 RNA is generally detectable in upper and lower respiratory specimens during the acute phase of infection. The lowest concentration of SARS-CoV-2 viral copies this assay can detect is 250 copies / mL. A negative result does not preclude SARS-CoV-2 infection and should not be used as the sole basis for treatment or other patient management decisions.  A negative result may occur with improper specimen collection / handling, submission of specimen other than nasopharyngeal swab, presence of viral mutation(s) within the areas targeted by this assay, and inadequate number of viral copies (<250 copies / mL). A negative result must be combined with clinical observations, patient history, and epidemiological information. Fact Sheet for Patients:   StrictlyIdeas.no Fact Sheet for Healthcare Providers: BankingDealers.co.za This test is not yet approved or cleared  by the [place] FDA and has been authorized for detection and/or diagnosis of SARS-CoV-2 by FDA under an Emergency Use Authorization (EUA).  This EUA will remain in effect (meaning this test can be used) for the duration of the COVID-19 declaration under Section 564(b)(1) of the Act, 21 U.S.C. section 360bbb-3(b)(1), unless the authorization is terminated or revoked sooner. Performed at [facility], [place] [address], [place], [place] [address]   MRSA PCR  Screening     Status: None   Collection Time: 07/09/19  7:53 PM   Specimen: Nasal Mucosa; Nasopharyngeal  Result Value Ref Range Status   MRSA by PCR NEGATIVE NEGATIVE Final    Comment:        The GeneXpert MRSA Assay (FDA approved for NASAL specimens only), is one component of a comprehensive MRSA colonization surveillance program. It is not intended to diagnose MRSA infection nor to guide or monitor treatment for MRSA infections. Performed at [facility], [place]  [address], [place], [place] [address]         Radiology Studies: DG Chest 1 View  Result Date: 07/09/2019 CLINICAL DATA:  Tachycardia and diffuse abdominal pain. EXAM: CHEST  1 VIEW COMPARISON:  04/28/2019 FINDINGS: Previous median sternotomy. Power port inserted from a right jugular approach with tip at the SVC RA junction. Heart size is upper limits of normal with some left ventricular prominence. No acute mediastinal finding. The pulmonary vascularity is normal. The lungs are clear. No infiltrate, collapse or effusion. IMPRESSION: No active disease. Electronically Signed   By: Nelson Chimes M.D.   On: 07/09/2019 14:21   DG Abdomen 1 View  Result Date: 07/09/2019 CLINICAL DATA:  Tachycardia.  Diffuse abdominal pain. EXAM: ABDOMEN - 1 VIEW COMPARISON:  None. FINDINGS: There is gas throughout small and large bowel most consistent with ileus. No focal dilatation to suggest obstruction. No free air. No abnormal calcifications. Ordinary degenerative changes affect the spine. IMPRESSION: Possible ileus pattern. No evidence of frank obstruction or focal lesion. Electronically Signed   By: Nelson Chimes M.D.   On: 07/09/2019 14:22        Scheduled Meds: . Chlorhexidine Gluconate Cloth  6 each Topical Q0600  . cholecalciferol  1,000 Units Oral Daily  . digoxin  0.125 mg Oral Daily  . ferrous sulfate  325 mg Oral Q breakfast  . insulin aspart  0-15 Units Subcutaneous TID WC  . insulin aspart  0-5 Units Subcutaneous QHS  . iohexol      . metoprolol tartrate  100 mg Oral BID  . pantoprazole  40 mg Oral Daily  . senna-docusate  2 tablet Oral BID  . vitamin B-12  500 mcg Oral Daily   Continuous Infusions: . sodium chloride 75 mL/hr at 07/10/19 0609  . diltiazem (CARDIZEM) infusion 15 mg/hr (07/10/19 0737)     LOS: 1 day     Cordelia Poche, MD [facility] 07/10/2019, 8:23 AM  If 7PM-7AM, please contact night-coverage [email or web address]

## 2019-07-10 NOTE — Consult Note (Signed)
[facility] Surgery Consult Note  Chen Saadeh. 1950-03-16  [id number].    Requesting MD: Lonny Prude, MD Chief Complaint/Reason for Consult: malignant sigmoid stricture with resulting LBO HPI:  [name] is a 69 y/o M with a PMH head and neck cancer, DM2, CAD s/p CABG 2017, CHFrEF (40%), a.fib/a.flutter, and metastatic adenocarcinoma to the liver who was admitted to [facility] long hospital from the cancer center where he was found to be in a.fib with RVR. In the hospital his abdomen was noted to be distended and he developed nausea and vomiting. A CT scan was ordered showing significant stricture at the level of the sigmoid colon and CCS has been asked to evaluate.   Today the patient tells me that about a month ago he was having daily stools that were small in caliber and non-bloody. About 3 weeks ago he started having non-bloody diarrhea. He says that he has not had a BM in 5 days and he is now passing very little flatus. He reports one week of central, intermittent abdominal pain. Since his hospital admission he has developed nausea and recurrent emesis. He denies hematochezia or hematemesis.   PSH: appendectomy, G-tube   ROS: Review of Systems  All other systems reviewed and are negative.   Family History  Problem Relation Age of Onset  . Breast cancer Mother   . Colon polyps Father   . Heart disease Father   . Colon cancer Neg Hx   . Esophageal cancer Neg Hx   . Rectal cancer Neg Hx   . Stomach cancer Neg Hx     Past Medical History:  Diagnosis Date  . Cancer ([place])   . CHF (congestive heart failure) ([place])   . Colitis   . COVID-19 04/29/2019  . Diabetes ([place])   . Paronychia of second toe of right foot   . Throat cancer [facility])     Past Surgical History:  Procedure Laterality Date  . APPENDECTOMY  1970  . CORONARY ARTERY BYPASS GRAFT     5 vessel   . IR IMAGING GUIDED PORT INSERTION  04/04/2019  . TOOTH EXTRACTION      Social History:  reports that he has  never smoked. He has never used smokeless tobacco. He reports previous alcohol use. He reports that he does not use drugs.  Allergies: No Known Allergies  Medications Prior to Admission  Medication Sig Dispense Refill  . ascorbic acid (VITAMIN C) 500 MG tablet Take 1 tablet (500 mg total) by mouth daily.    . Cholecalciferol (D3-1000 PO) Take 1,000 Units by mouth daily.     . Chromium-Cinnamon [phone] MCG-MG CAPS Take 2,000 mg by mouth daily.    . digoxin (LANOXIN) 0.125 MG tablet Take 1 tablet (0.125 mg total) by mouth daily. 30 tablet 1  . ELIQUIS 5 MG TABS tablet Take 5 mg by mouth 2 (two) times daily.    . ferrous sulfate 325 (65 FE) MG EC tablet Take 325 mg by mouth daily with breakfast.    . glipiZIDE (GLUCOTROL) 10 MG tablet Take 10 mg by mouth daily before breakfast.    . levalbuterol (XOPENEX HFA) 45 MCG/ACT inhaler Inhale 2 puffs into the lungs every 6 (six) hours as needed for wheezing. 1 Inhaler 0  . loperamide (IMODIUM) 2 MG capsule Take 1 capsule by mouth 4 (four) times daily as needed for diarrhea or loose stools.     . magnesium oxide (MAG-OX) 400 MG tablet Take 800 mg by mouth daily.     Marland Kitchen  metoprolol tartrate (LOPRESSOR) 100 MG tablet Take 1 tablet (100 mg total) by mouth 2 (two) times daily. 60 tablet 1  . Turmeric 500 MG CAPS Take 2,000 mg by mouth daily. Taking 2 - 1000 mg tabs daily    . vitamin B-12 (CYANOCOBALAMIN) 500 MCG tablet Take 500 mcg by mouth daily.    Marland Kitchen diltiazem (CARDIZEM) 30 MG tablet Take 1 tablet (30 mg total) by mouth 2 (two) times daily. (Patient not taking: Reported on 06/25/2019) 60 tablet 3  . lidocaine-prilocaine (EMLA) cream Apply to port site 1-2 hours prior to use (Patient not taking: Reported on 06/13/2019) 30 g 2  . prochlorperazine (COMPAZINE) 10 MG tablet Take 1 tablet (10 mg total) by mouth every 6 (six) hours as needed for nausea or vomiting. (Patient not taking: Reported on 07/09/2019) 30 tablet 2    Blood pressure 108/71, pulse (!) 125,  temperature 98.1 F (36.7 C), temperature source Oral, resp. rate (!) 24, SpO2 96 %. Physical Exam: Constitutional: NAD; conversant; no deformities Eyes: Moist conjunctiva; no lid lag; anicteric; PERRL Neck: Trachea midline; no thyromegaly Lungs: Normal respiratory effort; no tactile fremitus CV: HR 92 and irregular; no palpable thrills; no pitting edema GI: Abd soft, protuberant and tympanic, mild TTP without guarding or peritonitis, previous epigastric incision and G-tube site noted.  MSK: symmetrical; no clubbing/cyanosis Psychiatric: Appropriate affect; alert and oriented x3 Lymphatic: No palpable cervical or axillary lymphadenopathy  Results for orders placed or performed during the hospital encounter of 07/09/19 (from the past 48 hour(s))  SARS Coronavirus 2 by RT PCR (hospital order, performed in [facility] hospital lab) Nasopharyngeal Nasopharyngeal Swab     Status: None   Collection Time: 07/09/19 12:44 PM   Specimen: Nasopharyngeal Swab  Result Value Ref Range   SARS Coronavirus 2 NEGATIVE NEGATIVE    Comment: (NOTE) SARS-CoV-2 target nucleic acids are NOT DETECTED. The SARS-CoV-2 RNA is generally detectable in upper and lower respiratory specimens during the acute phase of infection. The lowest concentration of SARS-CoV-2 viral copies this assay can detect is 250 copies / mL. A negative result does not preclude SARS-CoV-2 infection and should not be used as the sole basis for treatment or other patient management decisions.  A negative result may occur with improper specimen collection / handling, submission of specimen other than nasopharyngeal swab, presence of viral mutation(s) within the areas targeted by this assay, and inadequate number of viral copies (<250 copies / mL). A negative result must be combined with clinical observations, patient history, and epidemiological information. Fact Sheet for Patients:   StrictlyIdeas.no Fact Sheet for  Healthcare Providers: BankingDealers.co.za This test is not yet approved or cleared  by the [place] FDA and has been authorized for detection and/or diagnosis of SARS-CoV-2 by FDA under an Emergency Use Authorization (EUA).  This EUA will remain in effect (meaning this test can be used) for the duration of the COVID-19 declaration under Section 564(b)(1) of the Act, 21 U.S.C. section 360bbb-3(b)(1), unless the authorization is terminated or revoked sooner. Performed at [facility], [place] [address], [place], [place] [address]   POC CBG, ED     Status: Abnormal   Collection Time: 07/09/19  2:24 PM  Result Value Ref Range   Glucose-Capillary 196 (H) 70 - 99 mg/dL    Comment: Glucose reference range applies only to samples taken after fasting for at least 8 hours.  MRSA PCR Screening     Status: None   Collection Time: 07/09/19  7:53  PM   Specimen: Nasal Mucosa; Nasopharyngeal  Result Value Ref Range   MRSA by PCR NEGATIVE NEGATIVE    Comment:        The GeneXpert MRSA Assay (FDA approved for NASAL specimens only), is one component of a comprehensive MRSA colonization surveillance program. It is not intended to diagnose MRSA infection nor to guide or monitor treatment for MRSA infections. Performed at [facility], [place] [address] [place], [place] [address]   Digoxin level     Status: Abnormal   Collection Time: 07/09/19  9:00 PM  Result Value Ref Range   Digoxin Level 0.6 (L) 0.8 - 2.0 ng/mL    Comment: Performed at [facility], [place] [address], [place], [place] [address]  TSH     Status: None   Collection Time: 07/09/19  9:00 PM  Result Value Ref Range   TSH 1.684 0.350 - 4.500 uIU/mL    Comment: Performed by a 3rd Generation assay with a functional sensitivity of <=0.01 uIU/mL. Performed at [facility], [place] [address] [place], [place] [address]   Hemoglobin A1c      Status: Abnormal   Collection Time: 07/09/19  9:00 PM  Result Value Ref Range   Hgb A1c MFr Bld 7.8 (H) 4.8 - 5.6 %    Comment: (NOTE) Pre diabetes:          5.7%-6.4% Diabetes:              >6.4% Glycemic control for   <7.0% adults with diabetes    Mean Plasma Glucose 177.16 mg/dL    Comment: Performed at [facility], [place] [address] [place], [place] [address]  Glucose, capillary     Status: Abnormal   Collection Time: 07/09/19  9:07 PM  Result Value Ref Range   Glucose-Capillary 130 (H) 70 - 99 mg/dL    Comment: Glucose reference range applies only to samples taken after fasting for at least 8 hours.  Comprehensive metabolic panel     Status: Abnormal   Collection Time: 07/10/19  3:46 AM  Result Value Ref Range   Sodium 137 135 - 145 mmol/L   Potassium 4.2 3.5 - 5.1 mmol/L   Chloride 100 98 - 111 mmol/L   CO2 27 22 - 32 mmol/L   Glucose, Bld 181 (H) 70 - 99 mg/dL    Comment: Glucose reference range applies only to samples taken after fasting for at least 8 hours.   BUN 37 (H) 8 - 23 mg/dL   Creatinine, Ser 1.18 0.61 - 1.24 mg/dL   Calcium 8.6 (L) 8.9 - 10.3 mg/dL   Total Protein 6.5 6.5 - 8.1 g/dL   Albumin 3.2 (L) 3.5 - 5.0 g/dL   AST 18 15 - 41 U/L   ALT 14 0 - 44 U/L   Alkaline Phosphatase 146 (H) 38 - 126 U/L   Total Bilirubin 0.9 0.3 - 1.2 mg/dL   GFR calc non Af Amer >60 >60 mL/min   GFR calc Af Amer >60 >60 mL/min   Anion gap 10 5 - 15    Comment: Performed at [facility], [place] [address], [place], [place] [address]  CBC     Status: Abnormal   Collection Time: 07/10/19  3:46 AM  Result Value Ref Range   WBC 8.6 4.0 - 10.5 K/uL   RBC 3.44 (L) 4.22 - 5.81 MIL/uL   Hemoglobin 9.9 (L) 13.0 - 17.0 g/dL   HCT 31.8 (  L) 39.0 - 52.0 %   MCV 92.4 80.0 - 100.0 fL   MCH 28.8 26.0 - 34.0 pg   MCHC 31.1 30.0 - 36.0 g/dL   RDW 20.3 (H) 11.5 - 15.5 %   Platelets 158 150 - 400 K/uL   nRBC 0.3 (H) 0.0 - 0.2 %    Comment: Performed at [facility], [place] [address], [place], [place] [address]  Magnesium     Status: None   Collection Time: 07/10/19  3:46 AM  Result Value Ref Range   Magnesium 2.2 1.7 - 2.4 mg/dL    Comment: Performed at [facility], [place] [address], [place], [place] [address]  Glucose, capillary     Status: Abnormal   Collection Time: 07/10/19  7:59 AM  Result Value Ref Range   Glucose-Capillary 204 (H) 70 - 99 mg/dL    Comment: Glucose reference range applies only to samples taken after fasting for at least 8 hours.   Comment 1 Notify RN    Comment 2 Document in Chart   Glucose, capillary     Status: Abnormal   Collection Time: 07/10/19 11:26 AM  Result Value Ref Range   Glucose-Capillary 234 (H) 70 - 99 mg/dL    Comment: Glucose reference range applies only to samples taken after fasting for at least 8 hours.   Comment 1 Notify RN    Comment 2 Document in Chart    DG Chest 1 View  Result Date: 07/09/2019 CLINICAL DATA:  Tachycardia and diffuse abdominal pain. EXAM: CHEST  1 VIEW COMPARISON:  04/28/2019 FINDINGS: Previous median sternotomy. Power port inserted from a right jugular approach with tip at the SVC RA junction. Heart size is upper limits of normal with some left ventricular prominence. No acute mediastinal finding. The pulmonary vascularity is normal. The lungs are clear. No infiltrate, collapse or effusion. IMPRESSION: No active disease. Electronically Signed   By: Nelson Chimes M.D.   On: 07/09/2019 14:21   DG Abdomen 1 View  Result Date: 07/09/2019 CLINICAL DATA:  Tachycardia.  Diffuse abdominal pain. EXAM: ABDOMEN - 1 VIEW COMPARISON:  None. FINDINGS: There is gas throughout small and large bowel most consistent with ileus. No focal dilatation to suggest obstruction. No free air. No abnormal calcifications. Ordinary degenerative changes affect the spine. IMPRESSION: Possible ileus pattern. No evidence of frank obstruction or focal lesion. Electronically Signed   By:  Nelson Chimes M.D.   On: 07/09/2019 14:22   CT ABDOMEN PELVIS W CONTRAST  Result Date: 07/10/2019 CLINICAL DATA:  Rectal tumor. Abdominal distention. No bowel movement for days. EXAM: CT ABDOMEN AND PELVIS WITH CONTRAST TECHNIQUE: Multidetector CT imaging of the abdomen and pelvis was performed using the standard protocol following bolus administration of intravenous contrast. CONTRAST:  14mL OMNIPAQUE IOHEXOL 300 MG/ML  SOLN COMPARISON:  03/15/2019 FINDINGS: Lower chest: Atelectasis and patchy airspace opacity in the lower lungs. Atherosclerosis and postoperative heart. Hepatobiliary: Multifocal hepatic metastatic disease with ill-defined masses measuring up to 4 cm in the posterior right liver, roughly 5 mm smaller than the before. Evidence of biliary obstruction or stone. Pancreas: Diffuse fatty atrophy with some preservation of density at the tail. There could be a cyst at the uncinate process, incidental in this setting. Spleen: Unremarkable. Adrenals/Urinary Tract: Negative adrenals. No hydronephrosis or stone. Simple bilateral renal cysts. Unremarkable bladder. Stomach/Bowel: Known sigmoid mass with severe luminal stenosis. The adjacent wall thickening is less thickened, but there is still spiculated density extending through the  serosa into the mesentery. Above this narrowing there is generalized gas and fluid dilated bowel. Vascular/Lymphatic: No acute vascular finding. Decrease in size of IMA lymph nodes. Reproductive:No pathologic findings. Other: No ascites or pneumoperitoneum. Musculoskeletal: No acute abnormalities. IMPRESSION: 1. 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 2. Multifocal hepatic metastatic disease which is stable or slightly improved from February 2021. Electronically Signed   By: Monte Fantasia M.D.   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   Metastatic adenocarcinoma of the colon to liver LBO 2/2 malignant stricture of the sigmoid colon - Colonoscopy 03/07/2019 by Dr. Loletha Carrow; nearly completely obstructing mass beginning at 16 cm from the anal verge, sigmoid colon polyp - liver Bx 03/29/19 w/ metastatic adenocarcinoma  - on FOLFOX chemo per Dr. [name], completed cycle 4 06/25/19, supposed to receive cycle 5 today but it was held due to a.fib with RVR  - patient with multiple medical problems outlined above, currently on chemotherapy, recommend GI consult for consideration of colonic stent placement.  - CCS will follow   Jill Alexanders, [facility] 07/10/2019, 1:00 PM

## 2019-07-10 NOTE — Progress Notes (Signed)
IP PROGRESS NOTE  Subjective:   Clarence Andrews was admitted yesterday with tachycardia and constipation.  He continues to have tachycardia despite a diltiazem infusion.  He complains of nausea this morning.  No bowel movement.  He reports passing flatus yesterday.  Objective: Vital signs in last 24 hours: Blood pressure 115/81, pulse (!) 137, temperature (!) 97.5 F (36.4 C), temperature source Oral, resp. rate (!) 23, SpO2 92 %.  Intake/Output from previous day: 06/08 0701 - 06/09 0700 In: 74 [I.V.:74] Out: 190 [Urine:40; Emesis/NG output:150]  Physical Exam:  HEENT: No thrush or ulcers Lungs: Bilateral expiratory rhonchi, no respiratory distress Cardiac: Regular rhythm, tachycardia Abdomen: Distended, no mass, nontender Extremities: No leg edema   Portacath/PICC-without erythema  Lab Results: Recent Labs    07/09/19 0938 07/10/19 0346  WBC 8.5 8.6  HGB 10.1* 9.9*  HCT 31.7* 31.8*  PLT 168 158    BMET Recent Labs    07/09/19 0938 07/10/19 0346  NA 139 137  K 5.0 4.2  CL 104 100  CO2 22 27  GLUCOSE 211* 181*  BUN 29* 37*  CREATININE 1.39* 1.18  CALCIUM 9.0 8.6*    Lab Results  Component Value Date   CEA1 94.26 (H) 07/09/2019    Studies/Results: DG Chest 1 View  Result Date: 07/09/2019 CLINICAL DATA:  Tachycardia and diffuse abdominal pain. EXAM: CHEST  1 VIEW COMPARISON:  04/28/2019 FINDINGS: Previous median sternotomy. Power port inserted from a right jugular approach with tip at the SVC RA junction. Heart size is upper limits of normal with some left ventricular prominence. No acute mediastinal finding. The pulmonary vascularity is normal. The lungs are clear. No infiltrate, collapse or effusion. IMPRESSION: No active disease. Electronically Signed   By: Mark  Shogry M.D.   On: 07/09/2019 14:21   DG Abdomen 1 View  Result Date: 07/09/2019 CLINICAL DATA:  Tachycardia.  Diffuse abdominal pain. EXAM: ABDOMEN - 1 VIEW COMPARISON:  None. FINDINGS: There is  gas throughout small and large bowel most consistent with ileus. No focal dilatation to suggest obstruction. No free air. No abnormal calcifications. Ordinary degenerative changes affect the spine. IMPRESSION: Possible ileus pattern. No evidence of frank obstruction or focal lesion. Electronically Signed   By: Mark  Shogry M.D.   On: 07/09/2019 14:22    Medications: I have reviewed the patient's current medications.  Assessment/Plan: 1. 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 Colonoscopy 03/07/2019-nearly completely obstructing mass beginning at 16 cm from the anal verge, sigmoid colon polyp ? CTs 03/15/2019-wall thickening of the lower sigmoid colon, low rectal mass with significant luminal narrowing, diffuse liver metastases, borderline enlarged sigmoid mesocolon, iliac, and upper abdominal lymph nodes ? Elevated CEA ? 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 Cycle 1 FOLFOX 04/11/2019 ? Cycle 2 FOLFOX 05/28/2019 ? Cycle 3 FOLFOX 06/11/2019 ? Cycle 4 FOLFOX 06/25/2019 2. 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 Diabetes 4. Coronary artery disease, status post coronary artery bypass surgery in 2017 5. Neutropenia following cycle 1 FOLFOX 6. Admission 04/28/2019 with COVID-19 infection 7. C. difficile colitis 04/28/2019 8. Atrial flutter during hospital admission March/April 2021-treated with a Cardizem drip, digoxin, and Eliquis anticoagulation. Converted to metoprolol at discharge.   9. Admission 07/09/2019 with rapid atrial fibrillation/flutter    10.  Constipation-ileus?,  Early obstruction related to the high   rectal tumor?  [name] is admitted with rapid atrial fibrillation/flutter.  He is being followed by  cardiology.  Anticoagulation has been on hold secondary to the rectal tumor with a history of bleeding.  He is at high risk for bleeding if placed on a direct oral anticoagulant, but we could consider Coumadin anticoagulation if cardiology feels anticoagulation is needed.  He has completed 4 cycles of FOLFOX.  The CEA is lower.  We had planned a restaging CT after cycle 5.  I agree with proceeding with a CT now to assess the liver tumor burden and for evidence of a colonic obstruction.  Nausea may be related to the rapid atrial fibrillation, ileus, or another GI process.  I will hold the oral iron as this may be contributing to nausea and constipation.  I will continue following Mr. Mahrt in the hospital and outpatient follow-up will be scheduled at the Cancer center.   LOS: 1 day   Betsy Coder, MD   07/10/2019, 8:26 AM

## 2019-07-10 NOTE — Progress Notes (Signed)
  Amiodarone Drug - Drug Interaction Consult Note Amiodarone is metabolized by the cytochrome P450 system and therefore has the potential to cause many drug interactions. Amiodarone has an average plasma half-life of 50 days (range 20 to 100 days).   There is potential for drug interactions to occur several weeks or months after stopping treatment and the onset of drug interactions may be slow after initiating amiodarone.    [x]  Beta blockers: increased risk of bradycardia, AV block and myocardial depression. Sotalol - avoid concomitant use. [x]  Digoxin dose should be halved when amiodarone is started. 6/8 dig level 0.6 on 0.125 mg po qday   Thank [place] Ouch  07/10/2019 10:32 AM

## 2019-07-11 ENCOUNTER — Inpatient Hospital Stay: Payer: Medicare HMO

## 2019-07-11 DIAGNOSIS — K56609 Unspecified intestinal obstruction, unspecified as to partial versus complete obstruction: Secondary | ICD-10-CM

## 2019-07-11 LAB — GLUCOSE, CAPILLARY
Glucose-Capillary: 108 mg/dL — ABNORMAL HIGH (ref 70–99)
Glucose-Capillary: 115 mg/dL — ABNORMAL HIGH (ref 70–99)
Glucose-Capillary: 72 mg/dL (ref 70–99)
Glucose-Capillary: 94 mg/dL (ref 70–99)

## 2019-07-11 LAB — CBC
HCT: 28.1 % — ABNORMAL LOW (ref 39.0–52.0)
Hemoglobin: 8.6 g/dL — ABNORMAL LOW (ref 13.0–17.0)
MCH: 28.9 pg (ref 26.0–34.0)
MCHC: 30.6 g/dL (ref 30.0–36.0)
MCV: 94.3 fL (ref 80.0–100.0)
Platelets: 183 10*3/uL (ref 150–400)
RBC: 2.98 MIL/uL — ABNORMAL LOW (ref 4.22–5.81)
RDW: 20.8 % — ABNORMAL HIGH (ref 11.5–15.5)
WBC: 8.8 10*3/uL (ref 4.0–10.5)
nRBC: 0.2 % (ref 0.0–0.2)

## 2019-07-11 LAB — COMPREHENSIVE METABOLIC PANEL
ALT: 11 U/L (ref 0–44)
AST: 16 U/L (ref 15–41)
Albumin: 2.8 g/dL — ABNORMAL LOW (ref 3.5–5.0)
Alkaline Phosphatase: 124 U/L (ref 38–126)
Anion gap: 11 (ref 5–15)
BUN: 44 mg/dL — ABNORMAL HIGH (ref 8–23)
CO2: 28 mmol/L (ref 22–32)
Calcium: 8.4 mg/dL — ABNORMAL LOW (ref 8.9–10.3)
Chloride: 100 mmol/L (ref 98–111)
Creatinine, Ser: 1.5 mg/dL — ABNORMAL HIGH (ref 0.61–1.24)
GFR calc Af Amer: 54 mL/min — ABNORMAL LOW (ref >60)
GFR calc non Af Amer: 46 mL/min — ABNORMAL LOW (ref >60)
Glucose, Bld: 125 mg/dL — ABNORMAL HIGH (ref 70–99)
Potassium: 3.9 mmol/L (ref 3.5–5.1)
Sodium: 139 mmol/L (ref 135–145)
Total Bilirubin: 0.6 mg/dL (ref 0.3–1.2)
Total Protein: 5.7 g/dL — ABNORMAL LOW (ref 6.5–8.1)

## 2019-07-11 LAB — PREALBUMIN: Prealbumin: 8.5 mg/dL — ABNORMAL LOW (ref 18–38)

## 2019-07-11 LAB — MAGNESIUM: Magnesium: 2.2 mg/dL (ref 1.7–2.4)

## 2019-07-11 MED ORDER — DEXTROSE-NACL 5-0.9 % IV SOLN: INTRAVENOUS | Status: AC

## 2019-07-11 MED ORDER — PANTOPRAZOLE SODIUM 40 MG IV SOLR
40.0000 mg | Freq: Every day | INTRAVENOUS | Status: DC
  Administered 2019-07-11 – 2019-07-20 (×10): 40 mg via INTRAVENOUS
  Filled 2019-07-11 (×10): qty 40

## 2019-07-11 NOTE — H&P (View-Only) (Signed)
[facility] Progress Note  CC:  Sigmoid colon cancer causing obstruction, ? Stent placement  Subjective:  Feels ok.  Passing a little bit of flatus but no BM.  NGT still in place and uncomfortable.  No abdominal pain.  Objective:  Vital signs in last 24 hours: Temp:  [98 F (36.7 C)-99.5 F (37.5 C)] 98 F (36.7 C) (06/10 0800) Pulse Rate:  [63-136] 65 (06/10 0900) Resp:  [9-27] 16 (06/10 0900) BP: (88-146)/(47-90) 116/52 (06/10 0800) SpO2:  [79 %-100 %] 92 % (06/10 0900) Weight:  [99 kg-99.5 kg] 99.5 kg (06/10 0500) Last BM Date: 07/05/19 General:  Alert, Well-developed, in NAD Heart:  Irregularly irregular. Pulm:  CTAB.  No increased WOB. Abdomen:  Soft, non-distended.  BS present, but quiet.  Non-tender. Extremities:  Without edema. Neurologic:  Alert and oriented x 4;  grossly normal neurologically. Psych:  Alert and cooperative. Normal mood and affect.  Intake/Output from previous day: 06/09 0701 - 06/10 0700 In: 2674.7 [P.O.:710; I.V.:1964.7] Out: 1800 [Urine:300; Emesis/NG output:1500]  Lab Results: Recent Labs    07/09/19 0938 07/10/19 0346 07/11/19 0445  WBC 8.5 8.6 8.8  HGB 10.1* 9.9* 8.6*  HCT 31.7* 31.8* 28.1*  PLT 168 158 183   BMET Recent Labs    07/09/19 0938 07/10/19 0346 07/11/19 0445  NA 139 137 139  K 5.0 4.2 3.9  CL 104 100 100  CO2 22 27 28   GLUCOSE 211* 181* 125*  BUN 29* 37* 44*  CREATININE 1.39* 1.18 1.50*  CALCIUM 9.0 8.6* 8.4*   LFT Recent Labs    07/11/19 0445  PROT 5.7*  ALBUMIN 2.8*  AST 16  ALT 11  ALKPHOS 124  BILITOT 0.6   DG Chest 1 View  Result Date: 07/09/2019 CLINICAL DATA:  Tachycardia and diffuse abdominal pain. EXAM: CHEST  1 VIEW COMPARISON:  04/28/2019 FINDINGS: Previous median sternotomy. Power port inserted from a right jugular approach with tip at the SVC RA junction. Heart size is upper limits of normal with some left ventricular prominence. No acute mediastinal finding. The  pulmonary vascularity is normal. The lungs are clear. No infiltrate, collapse or effusion. IMPRESSION: No active disease. Electronically Signed   By: Nelson Chimes M.D.   On: 07/09/2019 14:21   DG Abd 1 View  Result Date: 07/10/2019 CLINICAL DATA:  Nasogastric tube placement. EXAM: ABDOMEN - 1 VIEW COMPARISON:  July 09, 2019 (1:53 p.m.) FINDINGS: Multiple sternal wires and sternal fixation plates and screws are seen. A nasogastric tube is seen with its distal tip overlying the body of the stomach. Numerous dilated small bowel loops are again seen throughout the abdomen and pelvis. No radio-opaque calculi or other significant radiographic abnormality are seen. IMPRESSION: Nasogastric tube positioning, as described above. Electronically Signed   By: Virgina Norfolk M.D.   On: 07/10/2019 17:56   DG Abdomen 1 View  Result Date: 07/09/2019 CLINICAL DATA:  Tachycardia.  Diffuse abdominal pain. EXAM: ABDOMEN - 1 VIEW COMPARISON:  None. FINDINGS: There is gas throughout small and large bowel most consistent with ileus. No focal dilatation to suggest obstruction. No free air. No abnormal calcifications. Ordinary degenerative changes affect the spine. IMPRESSION: Possible ileus pattern. No evidence of frank obstruction or focal lesion. Electronically Signed   By: Nelson Chimes M.D.   On: 07/09/2019 14:22   CT ABDOMEN PELVIS W CONTRAST  Result Date: 07/10/2019 CLINICAL DATA:  Rectal tumor. Abdominal distention. No bowel movement for days. EXAM: CT ABDOMEN AND PELVIS WITH  CONTRAST TECHNIQUE: Multidetector CT imaging of the abdomen and pelvis was performed using the standard protocol following bolus administration of intravenous contrast. CONTRAST:  178mL OMNIPAQUE IOHEXOL 300 MG/ML  SOLN COMPARISON:  03/15/2019 FINDINGS: Lower chest: Atelectasis and patchy airspace opacity in the lower lungs. Atherosclerosis and postoperative heart. Hepatobiliary: Multifocal hepatic metastatic disease with ill-defined masses measuring  up to 4 cm in the posterior right liver, roughly 5 mm smaller than the before. Evidence of biliary obstruction or stone. Pancreas: Diffuse fatty atrophy with some preservation of density at the tail. There could be a cyst at the uncinate process, incidental in this setting. Spleen: Unremarkable. Adrenals/Urinary Tract: Negative adrenals. No hydronephrosis or stone. Simple bilateral renal cysts. Unremarkable bladder. Stomach/Bowel: Known sigmoid mass with severe luminal stenosis. The adjacent wall thickening is less thickened, but there is still spiculated density extending through the serosa into the mesentery. Above this narrowing there is generalized gas and fluid dilated bowel. Vascular/Lymphatic: No acute vascular finding. Decrease in size of IMA lymph nodes. Reproductive:No pathologic findings. Other: No ascites or pneumoperitoneum. Musculoskeletal: No acute abnormalities. IMPRESSION: 1. 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 2. Multifocal hepatic metastatic disease which is stable or slightly improved from February 2021. Electronically Signed   By: Monte Fantasia M.D.   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 Colonoscopy in February showed nearly completely obstructing mass beginning at 16 cm from the anal verge. On FOLFOX chemotherapy per Dr. [name], completed cycle 4 on Jun 25, 2019 and supposed to receive cycle 5 today was held due to A. fib with RVR. *A. fib with RVR:  Apparently was still taking Eliquis at home, last dose ? 6/8. *C. difficile colitis in March 2021 *Coronary artery disease status post CABG in 2017 *Type 2 diabetes mellitus with hemoglobin A1c 9.8 in March 2021 * History of head neck cancer treated in 2016 in [place] with surgery,  chemo, radiation  -Continue NGT and bowel rest. -Discussing possibility for colonic stent placement for tomorrow, 6/11, if endo/anesthesia able. -Consider diverting colostomy as well.  Surgery following.   LOS: 2 days   Laban Emperor. Zehr  07/11/2019, 9:02 AM     Attending Physician Note   I have taken an interval history, reviewed the chart and examined the patient. I agree with the Advanced Practitioner's note, impression and recommendations.   Discussed potential for colorectal stent placement with Dr. Rush Landmark who is available to assist. Plan to evaluate with flex sigmoidoscopy tomorrow at 3:30 pm to make a determination about stent feasibility. Will proceed with stent placement at that time if reasonable. The risks (including bleeding, perforation, infection, missed lesions, medication reactions and possible hospitalization or surgery if complications occur), benefits, and alternatives to flex sigmoidoscopy with possible biopsy and possible stent placement were discussed with the patient and they consent to proceed. Pt understands the risks of stent placement included a higher risk of perforation, bleeding, stent migration, and other unanticipated problems. Pt understands colorectal stents are best used as a temporary bridge to surgical mgmt.   Lucio Edward, MD [facility]

## 2019-07-11 NOTE — Progress Notes (Signed)
PROGRESS NOTE    Clarence Andrews.  RXV:[id number] DOB: Sep 22, 1950 DOA: 07/09/2019 PCP: Patient, No Pcp Per   Brief Narrative: Clarence Nutting. is a 69 y.o. male with a history of CAD s/p CABG, persistent atrial fibrillation/flutter not on anticoagulation secondary to rectal bleeding, chronic systolic heart failure, previous COVID-19 infection, metastatic colon cancer. Patient presented from his oncology office secondary to atrial fibrillation with RVR. While admitted he developed significant nausea and vomiting with CT evidence of obstruction of his sigmoid from is colonic mass.   Assessment & Plan:   Principal Problem:   Atrial fibrillation with RVR ([place]) Active Problems:   Cancer of left colon (HCC)   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HCC)   Type 2 diabetes mellitus without complication (HCC)   CAD (coronary artery disease)   Chronic systolic heart failure (HCC)   Persistent atrial flutter/fibrillation with RVR Patient with a history of atrial fibrillation as an outpatient on metoprolol 100 mg BID and digoxin 0.125 mg daily. Patient's heart rate better controlled on regimen of Cardizem drip titrated to 15 mg/hr, digoxin 0.25 mg IV daily, metoprolol 5 mg IV q6 hours -Cardiology recommendations: Cardizem, digoxin, metoprolol; pending today  Large bowel obstruction Patient with no bowel movement in 5 days prior to admission. No prior history of obstruction but has had abdominal surgery in the past. History of adenocarcinoma of the colon as mentioned below. Abdominal x-ray (6/8) significant for possible ileus. CT abdomen/pelvis (6/9) obtained and significant for severe/obstructing malignant stricture at sigmoid colon. General surgery and GI consulted on 6/9. Patient feels better after NG tube placement. -General surgery recommendations: NG tube placement, GI consult for stent placement -GI recommendations: Considering stent placement  Adenocarcinoma of the colon Metastatic  adenocarcinoma to liver Recently diagnosed. Currently follows with medical oncology, Dr. [name] and is on active chemotherapy treatment.  Chronic systolic heart failure EF of 40-45%. Patient is euvolemic on admission. On metoprolol (Lopressor) as an outpatient. Patient is not on diuretic therapy. Rales noted on exam but chest x-ray without evidence of effusion or edema.  -Watch fluid status; daily weights and strict in/out  History of CABG CAD Patient was previously on aspirin which was discontinued secondary to rectal bleeding.  Diabetes mellitus, type 2 Patient is on glipizide as an outpatient. Last hemoglobin A1C of 7.8%.  -Continue SSI  CKD stage II Baseline creatinine of 1.2. Trended up. -Watch BMP daily   DVT prophylaxis: SCDs Code Status:   Code Status: Full Code Family Communication: None at bedside Disposition Plan: Discharge date unknown at this time. Pending continued clinical workup/management of bowel obstruction and afib with RVR. Anticipate discharge home.   Consultants:   Cardiology (6/8)  General surgery (6/9)  Gastroenterology (6/9)  Procedures:   None  Antimicrobials:  None    Subjective: Emesis and nausea improved since NG tube placed. No other concerns.  Objective: Vitals:   07/11/19 0500 07/11/19 0700 07/11/19 0800 07/11/19 0900  BP: (!) 111/56 (!) 121/57 (!) 116/52   Pulse: 65 64 66 65  Resp: 15 20 (!) 23 16  Temp:   98 F (36.7 C)   TempSrc:   Oral   SpO2: 94% 95% 92% 92%  Weight: 99.5 kg     Height:        Intake/Output Summary (Last 24 hours) at 07/11/2019 0942 Last data filed at 07/11/2019 0500 Gross per 24 hour  Intake 2420.55 ml  Output 1800 ml  Net 620.55 ml   Filed Weights   07/10/19  1700 07/11/19 0500  Weight: 99 kg 99.5 kg    Examination:  General exam: Appears calm and comfortable Respiratory system: Rales. Respiratory effort normal. Cardiovascular system: S1 & S2 heard, Irregular rhythm with normal rate. No  murmurs, rubs, gallops or clicks. Gastrointestinal system: Abdomen is distended, soft and nontender. Normal bowel sounds heard. Central nervous system: Alert and oriented. No focal neurological deficits. Musculoskeletal: No edema. No calf tenderness Skin: No cyanosis. No rashes Psychiatry: Judgement and insight appear normal. Mood & affect appropriate.     Data Reviewed: I have personally reviewed following labs and imaging studies  CBC Lab Results  Component Value Date   WBC 8.8 07/11/2019   RBC 2.98 (L) 07/11/2019   HGB 8.6 (L) 07/11/2019   HCT 28.1 (L) 07/11/2019   MCV 94.3 07/11/2019   MCH 28.9 07/11/2019   PLT 183 07/11/2019   MCHC 30.6 07/11/2019   RDW 20.8 (H) 07/11/2019   LYMPHSABS 0.6 (L) 07/09/2019   MONOABS 1.5 (H) 07/09/2019   EOSABS 0.0 07/09/2019   BASOSABS 0.1 62/69/4854     Last metabolic panel Lab Results  Component Value Date   NA 139 07/11/2019   K 3.9 07/11/2019   CL 100 07/11/2019   CO2 28 07/11/2019   BUN 44 (H) 07/11/2019   CREATININE 1.50 (H) 07/11/2019   GLUCOSE 125 (H) 07/11/2019   GFRNONAA 46 (L) 07/11/2019   GFRAA 54 (L) 07/11/2019   CALCIUM 8.4 (L) 07/11/2019   PHOS 2.6 05/02/2019   PROT 5.7 (L) 07/11/2019   ALBUMIN 2.8 (L) 07/11/2019   BILITOT 0.6 07/11/2019   ALKPHOS 124 07/11/2019   AST 16 07/11/2019   ALT 11 07/11/2019   ANIONGAP 11 07/11/2019    CBG (last 3)  Recent Labs    07/10/19 1645 07/10/19 2241 07/11/19 0820  GLUCAP 201* 131* 115*     GFR: Estimated Creatinine Clearance: 56.9 mL/min (A) (by C-G formula based on SCr of 1.5 mg/dL (H)).  Coagulation Profile: No results for input(s): INR, PROTIME in the last 168 hours.  Recent Results (from the past 240 hour(s))  SARS Coronavirus 2 by RT PCR (hospital order, performed in [facility] hospital lab) Nasopharyngeal Nasopharyngeal Swab     Status: None   Collection Time: 07/09/19 12:44 PM   Specimen: Nasopharyngeal Swab  Result Value Ref Range Status   SARS  Coronavirus 2 NEGATIVE NEGATIVE Final    Comment: (NOTE) SARS-CoV-2 target nucleic acids are NOT DETECTED. The SARS-CoV-2 RNA is generally detectable in upper and lower respiratory specimens during the acute phase of infection. The lowest concentration of SARS-CoV-2 viral copies this assay can detect is 250 copies / mL. A negative result does not preclude SARS-CoV-2 infection and should not be used as the sole basis for treatment or other patient management decisions.  A negative result may occur with improper specimen collection / handling, submission of specimen other than nasopharyngeal swab, presence of viral mutation(s) within the areas targeted by this assay, and inadequate number of viral copies (<250 copies / mL). A negative result must be combined with clinical observations, patient history, and epidemiological information. Fact Sheet for Patients:   StrictlyIdeas.no Fact Sheet for Healthcare Providers: BankingDealers.co.za This test is not yet approved or cleared  by the [place] FDA and has been authorized for detection and/or diagnosis of SARS-CoV-2 by FDA under an Emergency Use Authorization (EUA).  This EUA will remain in effect (meaning this test can be used) for the duration of the COVID-19 declaration under Section  564(b)(1) of the Act, 21 U.S.C. section 360bbb-3(b)(1), unless the authorization is terminated or revoked sooner. Performed at [facility], [place] [address] [place], [place] [address]   MRSA PCR Screening     Status: None   Collection Time: 07/09/19  7:53 PM   Specimen: Nasal Mucosa; Nasopharyngeal  Result Value Ref Range Status   MRSA by PCR NEGATIVE NEGATIVE Final    Comment:        The GeneXpert MRSA Assay (FDA approved for NASAL specimens only), is one component of a comprehensive MRSA colonization surveillance program. It is not intended to diagnose MRSA infection nor to  guide or monitor treatment for MRSA infections. Performed at [facility], [place] [address], [place], [place] [address]         Radiology Studies: DG Chest 1 View  Result Date: 07/09/2019 CLINICAL DATA:  Tachycardia and diffuse abdominal pain. EXAM: CHEST  1 VIEW COMPARISON:  04/28/2019 FINDINGS: Previous median sternotomy. Power port inserted from a right jugular approach with tip at the SVC RA junction. Heart size is upper limits of normal with some left ventricular prominence. No acute mediastinal finding. The pulmonary vascularity is normal. The lungs are clear. No infiltrate, collapse or effusion. IMPRESSION: No active disease. Electronically Signed   By: Nelson Chimes M.D.   On: 07/09/2019 14:21   DG Abd 1 View  Result Date: 07/10/2019 CLINICAL DATA:  Nasogastric tube placement. EXAM: ABDOMEN - 1 VIEW COMPARISON:  July 09, 2019 (1:53 p.m.) FINDINGS: Multiple sternal wires and sternal fixation plates and screws are seen. A nasogastric tube is seen with its distal tip overlying the body of the stomach. Numerous dilated small bowel loops are again seen throughout the abdomen and pelvis. No radio-opaque calculi or other significant radiographic abnormality are seen. IMPRESSION: Nasogastric tube positioning, as described above. Electronically Signed   By: Virgina Norfolk M.D.   On: 07/10/2019 17:56   DG Abdomen 1 View  Result Date: 07/09/2019 CLINICAL DATA:  Tachycardia.  Diffuse abdominal pain. EXAM: ABDOMEN - 1 VIEW COMPARISON:  None. FINDINGS: There is gas throughout small and large bowel most consistent with ileus. No focal dilatation to suggest obstruction. No free air. No abnormal calcifications. Ordinary degenerative changes affect the spine. IMPRESSION: Possible ileus pattern. No evidence of frank obstruction or focal lesion. Electronically Signed   By: Nelson Chimes M.D.   On: 07/09/2019 14:22   CT ABDOMEN PELVIS W CONTRAST  Result Date: 07/10/2019 CLINICAL DATA:   Rectal tumor. Abdominal distention. No bowel movement for days. EXAM: CT ABDOMEN AND PELVIS WITH CONTRAST TECHNIQUE: Multidetector CT imaging of the abdomen and pelvis was performed using the standard protocol following bolus administration of intravenous contrast. CONTRAST:  166mL OMNIPAQUE IOHEXOL 300 MG/ML  SOLN COMPARISON:  03/15/2019 FINDINGS: Lower chest: Atelectasis and patchy airspace opacity in the lower lungs. Atherosclerosis and postoperative heart. Hepatobiliary: Multifocal hepatic metastatic disease with ill-defined masses measuring up to 4 cm in the posterior right liver, roughly 5 mm smaller than the before. Evidence of biliary obstruction or stone. Pancreas: Diffuse fatty atrophy with some preservation of density at the tail. There could be a cyst at the uncinate process, incidental in this setting. Spleen: Unremarkable. Adrenals/Urinary Tract: Negative adrenals. No hydronephrosis or stone. Simple bilateral renal cysts. Unremarkable bladder. Stomach/Bowel: Known sigmoid mass with severe luminal stenosis. The adjacent wall thickening is less thickened, but there is still spiculated density extending through the serosa into the mesentery. Above this narrowing there is generalized gas  and fluid dilated bowel. Vascular/Lymphatic: No acute vascular finding. Decrease in size of IMA lymph nodes. Reproductive:No pathologic findings. Other: No ascites or pneumoperitoneum. Musculoskeletal: No acute abnormalities. IMPRESSION: 1. 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 2. Multifocal hepatic metastatic disease which is stable or slightly improved from February 2021. Electronically Signed   By: Monte Fantasia M.D.   On: 07/10/2019 11:32        Scheduled Meds: . Chlorhexidine Gluconate Cloth  6 each Topical Q0600  . cholecalciferol  1,000 Units Oral Daily  . digoxin  0.125 mg Intravenous Daily  .  insulin aspart  0-15 Units Subcutaneous TID WC  . insulin aspart  0-5 Units Subcutaneous QHS  . metoprolol tartrate  5 mg Intravenous Q6H  . pantoprazole  40 mg Oral Daily  . senna-docusate  2 tablet Oral BID  . sodium chloride flush  10-40 mL Intracatheter Q12H  . vitamin B-12  500 mcg Oral Daily   Continuous Infusions: . sodium chloride 75 mL/hr at 07/11/19 0400  . diltiazem (CARDIZEM) infusion 10 mg/hr (07/11/19 0458)     LOS: 2 days     Cordelia Poche, MD [facility] 07/11/2019, 9:42 AM  If 7PM-7AM, please contact night-coverage [email or web address]

## 2019-07-11 NOTE — Progress Notes (Addendum)
[facility] Progress Note     Subjective: CC:  Denies abdominal pain, states NG tube helped his pain and nausea. Some mild throat discomfort from tube. Reports some flatus yesterday afternoon, no BM.   NG 1500 cc/24h Objective: Vital signs in last 24 hours: Temp:  [98 F (36.7 C)-99.5 F (37.5 C)] 98 F (36.7 C) (06/10 0800) Pulse Rate:  [63-136] 65 (06/10 0900) Resp:  [9-27] 16 (06/10 0900) BP: (88-146)/(47-90) 116/52 (06/10 0800) SpO2:  [79 %-100 %] 92 % (06/10 0900) Weight:  [99 kg-99.5 kg] 99.5 kg (06/10 0500) Last BM Date: 07/05/19  Intake/Output from previous day: 06/09 0701 - 06/10 0700 In: 2674.7 [P.O.:710; I.V.:1964.7] Out: 1800 [Urine:300; Emesis/NG output:1500] Intake/Output this shift: No intake/output data recorded.  PE: Gen:  Alert, NAD, pleasant, chronically ill appearing ENT: NGT R nare  Card:  Regular rate and rhythm, pedal pulses 2+ BL Pulm:  Normal effort, clear to auscultation bilaterally Abd: Soft, non-tender, mild distention, no guarding, previous epigastric incision and G-tube site noted.  Skin: warm and dry, no rashes  Psych: A&Ox3   Lab Results:  Recent Labs    07/10/19 0346 07/11/19 0445  WBC 8.6 8.8  HGB 9.9* 8.6*  HCT 31.8* 28.1*  PLT 158 183   BMET Recent Labs    07/10/19 0346 07/11/19 0445  NA 137 139  K 4.2 3.9  CL 100 100  CO2 27 28  GLUCOSE 181* 125*  BUN 37* 44*  CREATININE 1.18 1.50*  CALCIUM 8.6* 8.4*   PT/INR No results for input(s): LABPROT, INR in the last 72 hours. CMP     Component Value Date/Time   NA 139 07/11/2019 0445   K 3.9 07/11/2019 0445   CL 100 07/11/2019 0445   CO2 28 07/11/2019 0445   GLUCOSE 125 (H) 07/11/2019 0445   BUN 44 (H) 07/11/2019 0445   CREATININE 1.50 (H) 07/11/2019 0445   CREATININE 1.39 (H) 07/09/2019 0938   CALCIUM 8.4 (L) 07/11/2019 0445   PROT 5.7 (L) 07/11/2019 0445   ALBUMIN 2.8 (L) 07/11/2019 0445   AST 16 07/11/2019 0445   AST 23 07/09/2019 0938   ALT  11 07/11/2019 0445   ALT 13 07/09/2019 0938   ALKPHOS 124 07/11/2019 0445   BILITOT 0.6 07/11/2019 0445   BILITOT 0.7 07/09/2019 0938   GFRNONAA 46 (L) 07/11/2019 0445   GFRNONAA 51 (L) 07/09/2019 0938   GFRAA 54 (L) 07/11/2019 0445   GFRAA 59 (L) 07/09/2019 0938   Lipase  No results found for: LIPASE     Studies/Results: DG Chest 1 View  Result Date: 07/09/2019 CLINICAL DATA:  Tachycardia and diffuse abdominal pain. EXAM: CHEST  1 VIEW COMPARISON:  04/28/2019 FINDINGS: Previous median sternotomy. Power port inserted from a right jugular approach with tip at the SVC RA junction. Heart size is upper limits of normal with some left ventricular prominence. No acute mediastinal finding. The pulmonary vascularity is normal. The lungs are clear. No infiltrate, collapse or effusion. IMPRESSION: No active disease. Electronically Signed   By: Nelson Chimes M.D.   On: 07/09/2019 14:21   DG Abd 1 View  Result Date: 07/10/2019 CLINICAL DATA:  Nasogastric tube placement. EXAM: ABDOMEN - 1 VIEW COMPARISON:  July 09, 2019 (1:53 p.m.) FINDINGS: Multiple sternal wires and sternal fixation plates and screws are seen. A nasogastric tube is seen with its distal tip overlying the body of the stomach. Numerous dilated small bowel loops are again seen throughout the abdomen and pelvis. No  radio-opaque calculi or other significant radiographic abnormality are seen. IMPRESSION: Nasogastric tube positioning, as described above. Electronically Signed   By: Virgina Norfolk M.D.   On: 07/10/2019 17:56   DG Abdomen 1 View  Result Date: 07/09/2019 CLINICAL DATA:  Tachycardia.  Diffuse abdominal pain. EXAM: ABDOMEN - 1 VIEW COMPARISON:  None. FINDINGS: There is gas throughout small and large bowel most consistent with ileus. No focal dilatation to suggest obstruction. No free air. No abnormal calcifications. Ordinary degenerative changes affect the spine. IMPRESSION: Possible ileus pattern. No evidence of frank obstruction  or focal lesion. Electronically Signed   By: Nelson Chimes M.D.   On: 07/09/2019 14:22   CT ABDOMEN PELVIS W CONTRAST  Result Date: 07/10/2019 CLINICAL DATA:  Rectal tumor. Abdominal distention. No bowel movement for days. EXAM: CT ABDOMEN AND PELVIS WITH CONTRAST TECHNIQUE: Multidetector CT imaging of the abdomen and pelvis was performed using the standard protocol following bolus administration of intravenous contrast. CONTRAST:  161mL OMNIPAQUE IOHEXOL 300 MG/ML  SOLN COMPARISON:  03/15/2019 FINDINGS: Lower chest: Atelectasis and patchy airspace opacity in the lower lungs. Atherosclerosis and postoperative heart. Hepatobiliary: Multifocal hepatic metastatic disease with ill-defined masses measuring up to 4 cm in the posterior right liver, roughly 5 mm smaller than the before. Evidence of biliary obstruction or stone. Pancreas: Diffuse fatty atrophy with some preservation of density at the tail. There could be a cyst at the uncinate process, incidental in this setting. Spleen: Unremarkable. Adrenals/Urinary Tract: Negative adrenals. No hydronephrosis or stone. Simple bilateral renal cysts. Unremarkable bladder. Stomach/Bowel: Known sigmoid mass with severe luminal stenosis. The adjacent wall thickening is less thickened, but there is still spiculated density extending through the serosa into the mesentery. Above this narrowing there is generalized gas and fluid dilated bowel. Vascular/Lymphatic: No acute vascular finding. Decrease in size of IMA lymph nodes. Reproductive:No pathologic findings. Other: No ascites or pneumoperitoneum. Musculoskeletal: No acute abnormalities. IMPRESSION: 1. 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 2. Multifocal hepatic metastatic disease which is stable or slightly improved from February 2021. Electronically Signed   By: Monte Fantasia M.D.   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   Metastatic adenocarcinoma of the colon to liver LBO 2/2 malignant stricture of the sigmoid colon - Colonoscopy 03/07/2019 by Dr. Loletha Carrow; nearly completely obstructing mass beginning at 16 cm from the anal verge, sigmoid colon polyp - liver Bx 03/29/19 w/ metastatic adenocarcinoma  - on FOLFOX chemo per Dr. [name], completed cycle 4 06/25/19, supposed to receive cycle 5 today but it was held due to a.fib with RVR  - will follow up with GI regarding potential colonic stent placement; I do believe this is the most appropriate plan for the patient. Surgical plan for this patient would be a palliative diverting ostomy and the perioperative morbidity and mortality is increased in this patient due to chronic illness, malnutrition, and chemotherapy. If a stent were placed it would be a lower risk to the patient and allow him to continue his chemotherapy.    LOS: 2 days    Obie Dredge, [facility] Please see Amion for pager  number during day hours 7:00am-4:30pm

## 2019-07-11 NOTE — Progress Notes (Addendum)
Progress Note  Patient Name: Clarence Andrews. Date of Encounter: 07/11/2019  Primary Cardiologist: Mertie Moores, MD   Subjective   HR improved on IV Cardizem and BB.  Denies any chest pain, SOB or abdominal pain  Inpatient Medications    Scheduled Meds:  Chlorhexidine Gluconate Cloth  6 each Topical Q0600   cholecalciferol  1,000 Units Oral Daily   digoxin  0.125 mg Intravenous Daily   insulin aspart  0-15 Units Subcutaneous TID WC   insulin aspart  0-5 Units Subcutaneous QHS   metoprolol tartrate  5 mg Intravenous Q6H   pantoprazole  40 mg Oral Daily   senna-docusate  2 tablet Oral BID   sodium chloride flush  10-40 mL Intracatheter Q12H   vitamin B-12  500 mcg Oral Daily   Continuous Infusions:  sodium chloride 75 mL/hr at 07/11/19 0400   diltiazem (CARDIZEM) infusion 10 mg/hr (07/11/19 0458)   PRN Meds: alum & mag hydroxide-simeth, levalbuterol, ondansetron **OR** ondansetron (ZOFRAN) IV, phenol, polyethylene glycol, sodium chloride flush   Vital Signs    Vitals:   07/11/19 0500 07/11/19 0700 07/11/19 0800 07/11/19 0900  BP: (!) 111/56 (!) 121/57 (!) 116/52   Pulse: 65 64 66 65  Resp: 15 20 (!) 23 16  Temp:   98 F (36.7 C)   TempSrc:   Oral   SpO2: 94% 95% 92% 92%  Weight: 99.5 kg     Height:        Intake/Output Summary (Last 24 hours) at 07/11/2019 1045 Last data filed at 07/11/2019 0500 Gross per 24 hour  Intake 2330.55 ml  Output 1800 ml  Net 530.55 ml   Filed Weights   07/10/19 1700 07/11/19 0500  Weight: 99 kg 99.5 kg    Telemetry    Atrial flutter with variable block and CVR with HR in the 60's - Personally Reviewed  ECG    NO new EKG to review - Personally Reviewed  Physical Exam   GEN: Will appearing HEENT: Normal NECK: No JVD; No carotid bruits LYMPHATICS: No lymphadenopathy CARDIAC:irregularly irregular, no murmurs, rubs, gallops RESPIRATORY:  Clear to auscultation without rales, wheezing or rhonchi  ABDOMEN:  Soft, non-tender, non-distended MUSCULOSKELETAL:  No edema; No deformity  SKIN: Warm and dry NEUROLOGIC:  Alert and oriented x 3 PSYCHIATRIC:  Normal affect    Labs    Chemistry Recent Labs  Lab 07/09/19 0938 07/10/19 0346 07/11/19 0445  NA 139 137 139  K 5.0 4.2 3.9  CL 104 100 100  CO2 22 27 28   GLUCOSE 211* 181* 125*  BUN 29* 37* 44*  CREATININE 1.39* 1.18 1.50*  CALCIUM 9.0 8.6* 8.4*  PROT 6.7 6.5 5.7*  ALBUMIN 3.0* 3.2* 2.8*  AST 23 18 16   ALT 13 14 11   ALKPHOS 189* 146* 124  BILITOT 0.7 0.9 0.6  GFRNONAA 51* >60 46*  GFRAA 59* >60 54*  ANIONGAP 13 10 11      Hematology Recent Labs  Lab 07/09/19 0938 07/10/19 0346 07/11/19 0445  WBC 8.5 8.6 8.8  RBC 3.53* 3.44* 2.98*  HGB 10.1* 9.9* 8.6*  HCT 31.7* 31.8* 28.1*  MCV 89.8 92.4 94.3  MCH 28.6 28.8 28.9  MCHC 31.9 31.1 30.6  RDW 20.2* 20.3* 20.8*  PLT 168 158 183    Cardiac EnzymesNo results for input(s): TROPONINI in the last 168 hours. No results for input(s): TROPIPOC in the last 168 hours.   BNPNo results for input(s): BNP, PROBNP in the last 168 hours.  DDimer No results for input(s): DDIMER in the last 168 hours.   Radiology    DG Chest 1 View  Result Date: 07/09/2019 CLINICAL DATA:  Tachycardia and diffuse abdominal pain. EXAM: CHEST  1 VIEW COMPARISON:  04/28/2019 FINDINGS: Previous median sternotomy. Power port inserted from a right jugular approach with tip at the SVC RA junction. Heart size is upper limits of normal with some left ventricular prominence. No acute mediastinal finding. The pulmonary vascularity is normal. The lungs are clear. No infiltrate, collapse or effusion. IMPRESSION: No active disease. Electronically Signed   By: Nelson Chimes M.D.   On: 07/09/2019 14:21   DG Abd 1 View  Result Date: 07/10/2019 CLINICAL DATA:  Nasogastric tube placement. EXAM: ABDOMEN - 1 VIEW COMPARISON:  July 09, 2019 (1:53 p.m.) FINDINGS: Multiple sternal wires and sternal fixation plates and screws are  seen. A nasogastric tube is seen with its distal tip overlying the body of the stomach. Numerous dilated small bowel loops are again seen throughout the abdomen and pelvis. No radio-opaque calculi or other significant radiographic abnormality are seen. IMPRESSION: Nasogastric tube positioning, as described above. Electronically Signed   By: Virgina Norfolk M.D.   On: 07/10/2019 17:56   DG Abdomen 1 View  Result Date: 07/09/2019 CLINICAL DATA:  Tachycardia.  Diffuse abdominal pain. EXAM: ABDOMEN - 1 VIEW COMPARISON:  None. FINDINGS: There is gas throughout small and large bowel most consistent with ileus. No focal dilatation to suggest obstruction. No free air. No abnormal calcifications. Ordinary degenerative changes affect the spine. IMPRESSION: Possible ileus pattern. No evidence of frank obstruction or focal lesion. Electronically Signed   By: Nelson Chimes M.D.   On: 07/09/2019 14:22   CT ABDOMEN PELVIS W CONTRAST  Result Date: 07/10/2019 CLINICAL DATA:  Rectal tumor. Abdominal distention. No bowel movement for days. EXAM: CT ABDOMEN AND PELVIS WITH CONTRAST TECHNIQUE: Multidetector CT imaging of the abdomen and pelvis was performed using the standard protocol following bolus administration of intravenous contrast. CONTRAST:  173mL OMNIPAQUE IOHEXOL 300 MG/ML  SOLN COMPARISON:  03/15/2019 FINDINGS: Lower chest: Atelectasis and patchy airspace opacity in the lower lungs. Atherosclerosis and postoperative heart. Hepatobiliary: Multifocal hepatic metastatic disease with ill-defined masses measuring up to 4 cm in the posterior right liver, roughly 5 mm smaller than the before. Evidence of biliary obstruction or stone. Pancreas: Diffuse fatty atrophy with some preservation of density at the tail. There could be a cyst at the uncinate process, incidental in this setting. Spleen: Unremarkable. Adrenals/Urinary Tract: Negative adrenals. No hydronephrosis or stone. Simple bilateral renal cysts. Unremarkable  bladder. Stomach/Bowel: Known sigmoid mass with severe luminal stenosis. The adjacent wall thickening is less thickened, but there is still spiculated density extending through the serosa into the mesentery. Above this narrowing there is generalized gas and fluid dilated bowel. Vascular/Lymphatic: No acute vascular finding. Decrease in size of IMA lymph nodes. Reproductive:No pathologic findings. Other: No ascites or pneumoperitoneum. Musculoskeletal: No acute abnormalities. IMPRESSION: 1. 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 2. Multifocal hepatic metastatic disease which is stable or slightly improved from February 2021. Electronically Signed   By: Monte Fantasia M.D.   On: 07/10/2019 11:32    Cardiac Studies   2D echo 04/28/2019 IMPRESSIONS   1. Difficult to assess LV systolic function given tachycardia and poor  windows, but appears mildly to moderately reduced. Left ventricular  ejection fraction, by estimation, is 40 to 45%.  Would consider repeat TTE  with contrast once heart rate better  controlled. There is moderate left ventricular hypertrophy. Left  ventricular diastolic parameters are indeterminate.  2. Right ventricle is poorly visualized, but function appears moderately  reduced  3. The mitral valve is normal in structure. No evidence of mitral valve  regurgitation.  4. The aortic valve is tricuspid. Aortic valve regurgitation is not  visualized. No aortic stenosis is present.  5. Aortic dilatation noted. 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  6. The inferior vena cava is dilated in size with <50% respiratory  variability, suggesting right atrial pressure of 15 mmHg.   Patient Profile     69 y.o. male with a hx of ASCAD s/p CABG in 2017, PAF, chronic systolic CHF, DM, Thoracic aortic aneurysm, throat/tongue CA and metastatic colorectal CA  who is being seen today for the evaluation of atrial fibrillation with RVR at the request of Dr. [name].   Assessment & Plan    1. Persistent atrial flutter/fibrillation now with RVR -he has a hx of atrial fibrillation but not had not been chronically anticoagulated despite a CHADS2VASC score of 4 due to ongoing GI bleeding from colorectal CA -he was started on Eliquis 5mg  BID in early April prior to d/c home after hospitalization for COVID but subsequently stopped by Dr. [name] due to to high bleeding risk -now admitted to ER with aflutter with RVR -PO lopressor stopped yesterday due to bowel obstruction and N/V -HR improved on Lopressor 5mg  IV q6 hours, IV Cardizem gtt and Digoxin 0.125mg  IV -IV Amio stopped due to N/V -of note I spoke with Dr. [name] and he says that patient is very high risk for ongoing rectal bleeding >>apparently the patient was still taking his Eliquis at home.  This has been stopped after conversation with Dr. [name] in regards to high risk of bleeding -Dr. [name] stated in notes today that potentially could try Coumadin but I am concerned that his INRs may be very erratic due to issues with nausea and may have higher bleeding risk on Coumadin if INRs are difficult to maintain in therapeutic range if he cannot maintain steady diet.  DOACs have lower bleeding risk than Coumadin but there is no reversal agent for Eliquis which would be the safest. -Will keep off anticoagulation for now since I think the risk of bleeding outweighs the benefits with known metastatic dz and chronic anemia  2.  ASCAD -s/p remote CABG -denies any anginal sx -PO BB changed to IV due to N/V and bowel obstruction -currently not on statin therapy -no ASA due to bleeding issues with colon  3.  Chronic systolic CHF -EF 25-85% on echo 04/2019 -? Tachy mediated -does not appear volume overloaded on exam today -he has not tolerated ACE or ARB in the past due to soft BP  4.  Malignant  neoplasm of sigmoid colon -per oncology -S/P 4 cycles of FOLFOX     For questions or updates, please contact [place] Please consult [email or web address] for contact info under Cardiology/STEMI.      Signed, Fransico Him, MD  07/11/2019, 10:45 AM
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
## 2019-07-11 NOTE — Progress Notes (Addendum)
HEMATOLOGY-ONCOLOGY PROGRESS NOTE  SUBJECTIVE: Abdominal pain better.  Denies nausea and vomiting.  No recent bowel movement but is passing flatus.  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[place])   04/11/2019 -  Chemotherapy   The patient had palonosetron (ALOXI) injection 0.25 mg, 0.25 mg, Intravenous,  Once, 4 of 6 cycles Administration: 0.25 mg (04/11/2019), 0.25 mg (05/28/2019), 0.25 mg (06/11/2019), 0.25 mg (06/25/2019) pegfilgrastim-cbqv (UDENYCA) injection 6 mg, 6 mg, Subcutaneous, Once, 3 of 5 cycles Administration: 6 mg (05/30/2019), 6 mg (06/13/2019), 6 mg (06/27/2019) leucovorin 972 mg in dextrose 5 % 250 mL infusion, 400 mg/m2 = 972 mg, Intravenous,  Once, 4 of 6 cycles Administration: 972 mg (04/11/2019), 972 mg (05/28/2019), 972 mg (06/11/2019), 972 mg (06/25/2019) oxaliplatin (ELOXATIN) 200 mg in dextrose 5 % 500 mL chemo infusion, 83 mg/m2 = 205 mg, Intravenous,  Once, 4 of 6 cycles Administration: 200 mg (04/11/2019), 200 mg (05/28/2019), 200 mg (06/11/2019), 200 mg (06/25/2019) fluorouracil (ADRUCIL) chemo injection 950 mg, 400 mg/m2 = 950 mg, Intravenous,  Once, 1 of 1 cycle Administration: 950 mg (04/11/2019) fluorouracil (ADRUCIL) 5,850 mg in sodium chloride 0.9 % 133 mL chemo infusion, 2,400 mg/m2 = 5,850 mg, Intravenous, 1 Day/Dose, 4 of 6 cycles Dose modification: 2,000 mg/m2 (original dose 2,400 mg/m2, Cycle 2, Reason: Provider Judgment) Administration: 5,850 mg (04/11/2019), 4,850 mg (05/28/2019), 4,850 mg (06/11/2019), 4,850 mg (06/25/2019)  for chemotherapy treatment.    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[place])   04/11/2019 -  Chemotherapy   The patient had palonosetron (ALOXI) injection 0.25 mg, 0.25 mg, Intravenous,  Once, 4 of 6 cycles Administration: 0.25 mg (04/11/2019), 0.25 mg (05/28/2019), 0.25 mg (06/11/2019), 0.25 mg  (06/25/2019) pegfilgrastim-cbqv (UDENYCA) injection 6 mg, 6 mg, Subcutaneous, Once, 3 of 5 cycles Administration: 6 mg (05/30/2019), 6 mg (06/13/2019), 6 mg (06/27/2019) leucovorin 972 mg in dextrose 5 % 250 mL infusion, 400 mg/m2 = 972 mg, Intravenous,  Once, 4 of 6 cycles Administration: 972 mg (04/11/2019), 972 mg (05/28/2019), 972 mg (06/11/2019), 972 mg (06/25/2019) oxaliplatin (ELOXATIN) 200 mg in dextrose 5 % 500 mL chemo infusion, 83 mg/m2 = 205 mg, Intravenous,  Once, 4 of 6 cycles Administration: 200 mg (04/11/2019), 200 mg (05/28/2019), 200 mg (06/11/2019), 200 mg (06/25/2019) fluorouracil (ADRUCIL) chemo injection 950 mg, 400 mg/m2 = 950 mg, Intravenous,  Once, 1 of 1 cycle Administration: 950 mg (04/11/2019) fluorouracil (ADRUCIL) 5,850 mg in sodium chloride 0.9 % 133 mL chemo infusion, 2,400 mg/m2 = 5,850 mg, Intravenous, 1 Day/Dose, 4 of 6 cycles Dose modification: 2,000 mg/m2 (original dose 2,400 mg/m2, Cycle 2, Reason: Provider Judgment) Administration: 5,850 mg (04/11/2019), 4,850 mg (05/28/2019), 4,850 mg (06/11/2019), 4,850 mg (06/25/2019)  for chemotherapy treatment.      PHYSICAL EXAMINATION:  Vitals:   07/11/19 0700 07/11/19 0800  BP: (!) 121/57 (!) 116/52  Pulse: 64 66  Resp: 20 (!) 23  Temp:  98 F (36.7 C)  SpO2: 95% 92%   Filed Weights   07/10/19 1700 07/11/19 0500  Weight: 99 kg 99.5 kg    Intake/Output from previous day: 06/09 0701 - 06/10 0700 In: 2674.7 [P.O.:710; I.V.:1964.7] Out: 1800 [Urine:300; Emesis/NG output:1500]  GENERAL:alert, no distress and comfortable HEENT: NG tube in place, white coating on tongue LUNGS: Rales to the bases HEART: Irregular, no lower extremity edema ABDOMEN:abdomen soft, non-tender  NEURO: alert & oriented x 3 with fluent  speech, no focal motor/sensory deficits  LABORATORY DATA:  I have reviewed the data as listed CMP Latest Ref Rng & Units 07/11/2019 07/10/2019 07/09/2019  Glucose 70 - 99 mg/dL 125(H) 181(H) 211(H)  BUN 8 - 23  mg/dL 44(H) 37(H) 29(H)  Creatinine 0.61 - 1.24 mg/dL 1.50(H) 1.18 1.39(H)  Sodium 135 - 145 mmol/L 139 137 139  Potassium 3.5 - 5.1 mmol/L 3.9 4.2 5.0  Chloride 98 - 111 mmol/L 100 100 104  CO2 22 - 32 mmol/L _0 Calcium 8.9 - 10.3 mg/dL 8.4(L) 8.6(L) 9.0  Total Protein 6.5 - 8.1 g/dL 5.7(L) 6.5 6.7  Total Bilirubin 0.3 - 1.2 mg/dL 0.6 0.9 0.7  Alkaline Phos 38 - 126 U/L 124 146(H) 189(H)  AST 15 - 41 U/L _1 ALT 0 - 44 U/L _2 Lab Results  Component Value Date   WBC 8.8 07/11/2019   HGB 8.6 (L) 07/11/2019   HCT 28.1 (L) 07/11/2019   MCV 94.3 07/11/2019   PLT 183 07/11/2019   NEUTROABS 6.2 07/09/2019    DG Chest 1 View  Result Date: 07/09/2019 CLINICAL DATA:  Tachycardia and diffuse abdominal pain. EXAM: CHEST  1 VIEW COMPARISON:  04/28/2019 FINDINGS: Previous median sternotomy. Power port inserted from a right jugular approach with tip at the SVC RA junction. Heart size is upper limits of normal with some left ventricular prominence. No acute mediastinal finding. The pulmonary vascularity is normal. The lungs are clear. No infiltrate, collapse or effusion. IMPRESSION: No active disease. Electronically Signed   By: Nelson Chimes M.D.   On: 07/09/2019 14:21   DG Abd 1 View  Result Date: 07/10/2019 CLINICAL DATA:  Nasogastric tube placement. EXAM: ABDOMEN - 1 VIEW COMPARISON:  July 09, 2019 (1:53 p.m.) FINDINGS: Multiple sternal wires and sternal fixation plates and screws are seen. A nasogastric tube is seen with its distal tip overlying the body of the stomach. Numerous dilated small bowel loops are again seen throughout the abdomen and pelvis. No radio-opaque calculi or other significant radiographic abnormality are seen. IMPRESSION: Nasogastric tube positioning, as described above. Electronically Signed   By: Virgina Norfolk M.D.   On: 07/10/2019 17:56   DG Abdomen 1 View  Result Date: 07/09/2019 CLINICAL DATA:  Tachycardia.  Diffuse abdominal pain. EXAM: ABDOMEN  - 1 VIEW COMPARISON:  None. FINDINGS: There is gas throughout small and large bowel most consistent with ileus. No focal dilatation to suggest obstruction. No free air. No abnormal calcifications. Ordinary degenerative changes affect the spine. IMPRESSION: Possible ileus pattern. No evidence of frank obstruction or focal lesion. Electronically Signed   By: Nelson Chimes M.D.   On: 07/09/2019 14:22   CT ABDOMEN PELVIS W CONTRAST  Result Date: 07/10/2019 CLINICAL DATA:  Rectal tumor. Abdominal distention. No bowel movement for days. EXAM: CT ABDOMEN AND PELVIS WITH CONTRAST TECHNIQUE: Multidetector CT imaging of the abdomen and pelvis was performed using the standard protocol following bolus administration of intravenous contrast. CONTRAST:  123m OMNIPAQUE IOHEXOL 300 MG/ML  SOLN COMPARISON:  03/15/2019 FINDINGS: Lower chest: Atelectasis and patchy airspace opacity in the lower lungs. Atherosclerosis and postoperative heart. Hepatobiliary: Multifocal hepatic metastatic disease with ill-defined masses measuring up to 4 cm in the posterior right liver, roughly 5 mm smaller than the before. Evidence of biliary obstruction or stone. Pancreas: Diffuse fatty atrophy with some preservation of density at the tail. There could be a cyst at the uncinate process, incidental in this setting. Spleen:  Unremarkable. Adrenals/Urinary Tract: Negative adrenals. No hydronephrosis or stone. Simple bilateral renal cysts. Unremarkable bladder. Stomach/Bowel: Known sigmoid mass with severe luminal stenosis. The adjacent wall thickening is less thickened, but there is still spiculated density extending through the serosa into the mesentery. Above this narrowing there is generalized gas and fluid dilated bowel. Vascular/Lymphatic: No acute vascular finding. Decrease in size of IMA lymph nodes. Reproductive:No pathologic findings. Other: No ascites or pneumoperitoneum. Musculoskeletal: No acute abnormalities. IMPRESSION: 1. Severe and  obstructing malignant stricture at the sigmoid colon where there is still extra serosal spiculated density extending into the mesentery. 2. Multifocal hepatic metastatic disease which is stable or slightly improved from February 2021. Electronically Signed   By: Monte Fantasia M.D.   On: 07/10/2019 11:32    ASSESSMENT AND PLAN: 1. 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 Colonoscopy 03/07/2019-nearly completely obstructing mass beginning at 16 cm from the anal verge, sigmoid colon polyp ? CTs 03/15/2019-wall thickening of the lower sigmoid colon, low rectal mass with significant luminal narrowing, diffuse liver metastases, borderline enlarged sigmoid mesocolon, iliac, and upper abdominal lymph nodes ? Elevated CEA ? 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 Cycle 1 FOLFOX 04/11/2019 ? Cycle 2 FOLFOX 05/28/2019 ? Cycle 3 FOLFOX 06/11/2019 ? Cycle 4 FOLFOX 06/25/2019 ? CT abdomen/pelvis 07/10/2027-severe obstructing stricture at the sigmoid colon, stable to slight improvement in hepatic metastatic disease compared to February 2021 2. 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 Diabetes 4. Coronary artery disease, status post coronary artery bypass surgery in 2017 5. Neutropenia following cycle 1 FOLFOX 6. Admission 04/28/2019 with COVID-19 infection 7. C. difficile colitis 04/28/2019 8. Atrial flutter during hospital admission March/April 2021-treated with a Cardizem drip, digoxin, and Eliquis anticoagulation. Converted to metoprolol at discharge. 9. 07/09/2019-hospital admission for bowel obstruction and A. Fib  Mr. Stutz appears stable.  CT scans reviewed which show some improvement of his cancer.  He has a bowel  obstruction.  He has been seen by both GI and general surgery.  General surgery is recommending intervention by GI which would be considered less risky.  Awaiting final recommendations.  His anticoagulation is on hold secondary to the rectal tumor with a history of bleeding.  We could consider him for Coumadin if cardiology feels anticoagulation is needed.  Recommendations: 1.  Await final recommendations from general surgery and GI. 2.  Continue to hold anticoagulation for now.    LOS: 2 days   Mikey Bussing, DNP, AGPCNP-BC, AOCNP 07/11/19 Mr. Brouwer was interviewed and examined.  I reviewed the CT images.  He reports passing flatus, but no bowel movement.  He will likely need an intervention procedure for relief of the colonic obstruction, placement of a colonic stent versus surgery.  He could receive anticoagulation therapy if the primary tumor is resected.  The restaging CT reveals some improvement in the liver metastases and there was a 1 month interval between the baseline CT and the start of systemic therapy.  The plan is to resume systemic therapy when he has recovered from the bowel obstruction.

## 2019-07-11 NOTE — Progress Notes (Addendum)
[facility] Progress Note  CC:  Sigmoid colon cancer causing obstruction, ? Stent placement  Subjective:  Feels ok.  Passing a little bit of flatus but no BM.  NGT still in place and uncomfortable.  No abdominal pain.  Objective:  Vital signs in last 24 hours: Temp:  [98 F (36.7 C)-99.5 F (37.5 C)] 98 F (36.7 C) (06/10 0800) Pulse Rate:  [63-136] 65 (06/10 0900) Resp:  [9-27] 16 (06/10 0900) BP: (88-146)/(47-90) 116/52 (06/10 0800) SpO2:  [79 %-100 %] 92 % (06/10 0900) Weight:  [99 kg-99.5 kg] 99.5 kg (06/10 0500) Last BM Date: 07/05/19 General:  Alert, Well-developed, in NAD Heart:  Irregularly irregular. Pulm:  CTAB.  No increased WOB. Abdomen:  Soft, non-distended.  BS present, but quiet.  Non-tender. Extremities:  Without edema. Neurologic:  Alert and oriented x 4;  grossly normal neurologically. Psych:  Alert and cooperative. Normal mood and affect.  Intake/Output from previous day: 06/09 0701 - 06/10 0700 In: 2674.7 [P.O.:710; I.V.:1964.7] Out: 1800 [Urine:300; Emesis/NG output:1500]  Lab Results: Recent Labs    07/09/19 0938 07/10/19 0346 07/11/19 0445  WBC 8.5 8.6 8.8  HGB 10.1* 9.9* 8.6*  HCT 31.7* 31.8* 28.1*  PLT 168 158 183   BMET Recent Labs    07/09/19 0938 07/10/19 0346 07/11/19 0445  NA 139 137 139  K 5.0 4.2 3.9  CL 104 100 100  CO2 22 27 28   GLUCOSE 211* 181* 125*  BUN 29* 37* 44*  CREATININE 1.39* 1.18 1.50*  CALCIUM 9.0 8.6* 8.4*   LFT Recent Labs    07/11/19 0445  PROT 5.7*  ALBUMIN 2.8*  AST 16  ALT 11  ALKPHOS 124  BILITOT 0.6   DG Chest 1 View  Result Date: 07/09/2019 CLINICAL DATA:  Tachycardia and diffuse abdominal pain. EXAM: CHEST  1 VIEW COMPARISON:  04/28/2019 FINDINGS: Previous median sternotomy. Power port inserted from a right jugular approach with tip at the SVC RA junction. Heart size is upper limits of normal with some left ventricular prominence. No acute mediastinal finding. The  pulmonary vascularity is normal. The lungs are clear. No infiltrate, collapse or effusion. IMPRESSION: No active disease. Electronically Signed   By: Nelson Chimes M.D.   On: 07/09/2019 14:21   DG Abd 1 View  Result Date: 07/10/2019 CLINICAL DATA:  Nasogastric tube placement. EXAM: ABDOMEN - 1 VIEW COMPARISON:  July 09, 2019 (1:53 p.m.) FINDINGS: Multiple sternal wires and sternal fixation plates and screws are seen. A nasogastric tube is seen with its distal tip overlying the body of the stomach. Numerous dilated small bowel loops are again seen throughout the abdomen and pelvis. No radio-opaque calculi or other significant radiographic abnormality are seen. IMPRESSION: Nasogastric tube positioning, as described above. Electronically Signed   By: Virgina Norfolk M.D.   On: 07/10/2019 17:56   DG Abdomen 1 View  Result Date: 07/09/2019 CLINICAL DATA:  Tachycardia.  Diffuse abdominal pain. EXAM: ABDOMEN - 1 VIEW COMPARISON:  None. FINDINGS: There is gas throughout small and large bowel most consistent with ileus. No focal dilatation to suggest obstruction. No free air. No abnormal calcifications. Ordinary degenerative changes affect the spine. IMPRESSION: Possible ileus pattern. No evidence of frank obstruction or focal lesion. Electronically Signed   By: Nelson Chimes M.D.   On: 07/09/2019 14:22   CT ABDOMEN PELVIS W CONTRAST  Result Date: 07/10/2019 CLINICAL DATA:  Rectal tumor. Abdominal distention. No bowel movement for days. EXAM: CT ABDOMEN AND PELVIS WITH  CONTRAST TECHNIQUE: Multidetector CT imaging of the abdomen and pelvis was performed using the standard protocol following bolus administration of intravenous contrast. CONTRAST:  147mL OMNIPAQUE IOHEXOL 300 MG/ML  SOLN COMPARISON:  03/15/2019 FINDINGS: Lower chest: Atelectasis and patchy airspace opacity in the lower lungs. Atherosclerosis and postoperative heart. Hepatobiliary: Multifocal hepatic metastatic disease with ill-defined masses measuring  up to 4 cm in the posterior right liver, roughly 5 mm smaller than the before. Evidence of biliary obstruction or stone. Pancreas: Diffuse fatty atrophy with some preservation of density at the tail. There could be a cyst at the uncinate process, incidental in this setting. Spleen: Unremarkable. Adrenals/Urinary Tract: Negative adrenals. No hydronephrosis or stone. Simple bilateral renal cysts. Unremarkable bladder. Stomach/Bowel: Known sigmoid mass with severe luminal stenosis. The adjacent wall thickening is less thickened, but there is still spiculated density extending through the serosa into the mesentery. Above this narrowing there is generalized gas and fluid dilated bowel. Vascular/Lymphatic: No acute vascular finding. Decrease in size of IMA lymph nodes. Reproductive:No pathologic findings. Other: No ascites or pneumoperitoneum. Musculoskeletal: No acute abnormalities. IMPRESSION: 1. 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 2. Multifocal hepatic metastatic disease which is stable or slightly improved from February 2021. Electronically Signed   By: Monte Fantasia M.D.   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 Colonoscopy in February showed nearly completely obstructing mass beginning at 16 cm from the anal verge. On FOLFOX chemotherapy per Dr. [name], completed cycle 4 on Jun 25, 2019 and supposed to receive cycle 5 today was held due to A. fib with RVR. *A. fib with RVR:  Apparently was still taking Eliquis at home, last dose ? 6/8. *C. difficile colitis in March 2021 *Coronary artery disease status post CABG in 2017 *Type 2 diabetes mellitus with hemoglobin A1c 9.8 in March 2021 * History of head neck cancer treated in 2016 in [place] with surgery,  chemo, radiation  -Continue NGT and bowel rest. -Discussing possibility for colonic stent placement for tomorrow, 6/11, if endo/anesthesia able. -Consider diverting colostomy as well.  Surgery following.   LOS: 2 days   Laban Emperor. Zehr  07/11/2019, 9:02 AM     Attending Physician Note   I have taken an interval history, reviewed the chart and examined the patient. I agree with the Advanced Practitioner's note, impression and recommendations.   Discussed potential for colorectal stent placement with Dr. Rush Landmark who is available to assist. Plan to evaluate with flex sigmoidoscopy tomorrow at 3:30 pm to make a determination about stent feasibility. Will proceed with stent placement at that time if reasonable. The risks (including bleeding, perforation, infection, missed lesions, medication reactions and possible hospitalization or surgery if complications occur), benefits, and alternatives to flex sigmoidoscopy with possible biopsy and possible stent placement were discussed with the patient and they consent to proceed. Pt understands the risks of stent placement included a higher risk of perforation, bleeding, stent migration, and other unanticipated problems. Pt understands colorectal stents are best used as a temporary bridge to surgical mgmt.   Lucio Edward, MD [facility]

## 2019-07-12 ENCOUNTER — Encounter (HOSPITAL_COMMUNITY)
Admission: EM | Disposition: A | Discharge status: Home / Self Care | Payer: Self-pay | Source: Home / Self Care | Attending: Family Medicine

## 2019-07-12 ENCOUNTER — Inpatient Hospital Stay (HOSPITAL_COMMUNITY): Payer: Medicare HMO | Admitting: Certified Registered Nurse Anesthetist

## 2019-07-12 ENCOUNTER — Inpatient Hospital Stay (HOSPITAL_COMMUNITY): Payer: Medicare HMO

## 2019-07-12 ENCOUNTER — Telehealth: Payer: Self-pay | Admitting: Oncology

## 2019-07-12 ENCOUNTER — Encounter (HOSPITAL_COMMUNITY): Payer: Self-pay | Admitting: Internal Medicine

## 2019-07-12 DIAGNOSIS — K56691 Other complete intestinal obstruction: Secondary | ICD-10-CM

## 2019-07-12 DIAGNOSIS — C189 Malignant neoplasm of colon, unspecified: Secondary | ICD-10-CM

## 2019-07-12 DIAGNOSIS — K5939 Other megacolon: Secondary | ICD-10-CM

## 2019-07-12 HISTORY — PX: COLONIC STENT PLACEMENT: SHX5542

## 2019-07-12 HISTORY — PX: FLEXIBLE SIGMOIDOSCOPY: SHX5431

## 2019-07-12 LAB — COMPREHENSIVE METABOLIC PANEL
ALT: 11 U/L (ref 0–44)
AST: 15 U/L (ref 15–41)
Albumin: 2.7 g/dL — ABNORMAL LOW (ref 3.5–5.0)
Alkaline Phosphatase: 112 U/L (ref 38–126)
Anion gap: 9 (ref 5–15)
BUN: 36 mg/dL — ABNORMAL HIGH (ref 8–23)
CO2: 27 mmol/L (ref 22–32)
Calcium: 8.1 mg/dL — ABNORMAL LOW (ref 8.9–10.3)
Chloride: 102 mmol/L (ref 98–111)
Creatinine, Ser: 1.17 mg/dL (ref 0.61–1.24)
GFR calc Af Amer: >60 mL/min (ref >60)
GFR calc non Af Amer: >60 mL/min (ref >60)
Glucose, Bld: 92 mg/dL (ref 70–99)
Potassium: 4 mmol/L (ref 3.5–5.1)
Sodium: 138 mmol/L (ref 135–145)
Total Bilirubin: 0.7 mg/dL (ref 0.3–1.2)
Total Protein: 5.6 g/dL — ABNORMAL LOW (ref 6.5–8.1)

## 2019-07-12 LAB — CBC
HCT: 27.6 % — ABNORMAL LOW (ref 39.0–52.0)
Hemoglobin: 8.4 g/dL — ABNORMAL LOW (ref 13.0–17.0)
MCH: 28.6 pg (ref 26.0–34.0)
MCHC: 30.4 g/dL (ref 30.0–36.0)
MCV: 93.9 fL (ref 80.0–100.0)
Platelets: 176 10*3/uL (ref 150–400)
RBC: 2.94 MIL/uL — ABNORMAL LOW (ref 4.22–5.81)
RDW: 19.9 % — ABNORMAL HIGH (ref 11.5–15.5)
WBC: 11.5 10*3/uL — ABNORMAL HIGH (ref 4.0–10.5)
nRBC: 0.2 % (ref 0.0–0.2)

## 2019-07-12 LAB — GLUCOSE, CAPILLARY
Glucose-Capillary: 93 mg/dL (ref 70–99)
Glucose-Capillary: 101 mg/dL — ABNORMAL HIGH (ref 70–99)
Glucose-Capillary: 100 mg/dL — ABNORMAL HIGH (ref 70–99)
Glucose-Capillary: 120 mg/dL — ABNORMAL HIGH (ref 70–99)

## 2019-07-12 LAB — MAGNESIUM: Magnesium: 2 mg/dL (ref 1.7–2.4)

## 2019-07-12 SURGERY — SIGMOIDOSCOPY, FLEXIBLE
Anesthesia: General

## 2019-07-12 MED ORDER — FENTANYL CITRATE (PF) 100 MCG/2ML IJ SOLN
INTRAMUSCULAR | Status: DC | PRN
  Administered 2019-07-12: 50 ug via INTRAVENOUS

## 2019-07-12 MED ORDER — LACTATED RINGERS IV SOLN
INTRAVENOUS | Status: DC | PRN
Start: 2019-07-12 — End: 2019-07-12

## 2019-07-12 MED ORDER — ONDANSETRON HCL 4 MG/2ML IJ SOLN
INTRAMUSCULAR | Status: DC | PRN
  Administered 2019-07-12: 4 mg via INTRAVENOUS

## 2019-07-12 MED ORDER — FENTANYL CITRATE (PF) 100 MCG/2ML IJ SOLN
INTRAMUSCULAR | Status: AC
  Filled 2019-07-12: qty 2

## 2019-07-12 MED ORDER — SUCCINYLCHOLINE CHLORIDE 20 MG/ML IJ SOLN
INTRAMUSCULAR | Status: DC | PRN
  Administered 2019-07-12: 120 mg via INTRAVENOUS

## 2019-07-12 MED ORDER — LIDOCAINE 2% (20 MG/ML) 5 ML SYRINGE
INTRAMUSCULAR | Status: DC | PRN
  Administered 2019-07-12: 40 mg via INTRAVENOUS

## 2019-07-12 MED ORDER — PROPOFOL 10 MG/ML IV BOLUS
INTRAVENOUS | Status: DC | PRN
  Administered 2019-07-12: 120 mg via INTRAVENOUS

## 2019-07-12 MED ORDER — PHENYLEPHRINE 40 MCG/ML (10ML) SYRINGE FOR IV PUSH (FOR BLOOD PRESSURE SUPPORT)
PREFILLED_SYRINGE | INTRAVENOUS | Status: DC | PRN
  Administered 2019-07-12 (×2): 80 ug via INTRAVENOUS
  Administered 2019-07-12: 120 ug via INTRAVENOUS
  Administered 2019-07-12 (×2): 80 ug via INTRAVENOUS

## 2019-07-12 MED ORDER — SODIUM CHLORIDE 0.9 % IV SOLN: INTRAVENOUS | Status: DC

## 2019-07-12 MED ORDER — FENTANYL CITRATE (PF) 100 MCG/2ML IJ SOLN: 25.0000 ug | INTRAMUSCULAR | Status: DC | PRN

## 2019-07-12 NOTE — Anesthesia Procedure Notes (Signed)
Procedure Name: Intubation Performed by: Gean Maidens, CRNA Pre-anesthesia Checklist: Patient identified, Emergency Drugs available, Suction available, Patient being monitored and Timeout performed Patient Re-evaluated:Patient Re-evaluated prior to induction Oxygen Delivery Method: Circle system utilized Preoxygenation: Pre-oxygenation with 100% oxygen Induction Type: IV induction and Rapid sequence Laryngoscope Size: Glidescope and 4 Grade View: Grade I Tube type: Oral Tube size: 7.5 mm Number of attempts: 1 Airway Equipment and Method: Video-laryngoscopy Placement Confirmation: ETT inserted through vocal cords under direct vision,  positive ETCO2 and breath sounds checked- equal and bilateral Secured at: 23 cm Tube secured with: Tape Dental Injury: Teeth and Oropharynx as per pre-operative assessment

## 2019-07-12 NOTE — Progress Notes (Signed)
PROGRESS NOTE    Clarence Andrews.  FWY:[id number] DOB: 1950-05-17 DOA: 07/09/2019 PCP: Patient, No Pcp Per   Brief Narrative: Clarence Andrews. is a 69 y.o. male with a history of CAD s/p CABG, persistent atrial fibrillation/flutter not on anticoagulation secondary to rectal bleeding, chronic systolic heart failure, previous COVID-19 infection, metastatic colon cancer. Patient presented from his oncology office secondary to atrial fibrillation with RVR. While admitted he developed significant nausea and vomiting with CT evidence of obstruction of his sigmoid from is colonic mass.   Assessment & Plan:   Principal Problem:   Atrial fibrillation with RVR ([place]) Active Problems:   Cancer of left colon (HCC)   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HCC)   Type 2 diabetes mellitus without complication (HCC)   CAD (coronary artery disease)   Chronic systolic heart failure (HCC)   Large bowel obstruction (HCC)   Persistent atrial flutter/fibrillation with RVR Patient with a history of atrial fibrillation as an outpatient on metoprolol 100 mg BID and digoxin 0.125 mg daily. Patient's heart rate better controlled on regimen of Cardizem drip titrated to 5 mg/hr, digoxin 0.125 mg IV daily, metoprolol 5 mg IV q6 hours -Cardiology recommendations: Cardizem, digoxin, metoprolol; no changes  Large bowel obstruction Patient with no bowel movement in 5 days prior to admission. No prior history of obstruction but has had abdominal surgery in the past. History of adenocarcinoma of the colon as mentioned below. Abdominal x-ray (6/8) significant for possible ileus. CT abdomen/pelvis (6/9) obtained and significant for severe/obstructing malignant stricture at sigmoid colon. General surgery and GI consulted on 6/9. Patient feels better after NG tube placement. -General surgery recommendations: NG tube placement, GI consult for stent placement -GI recommendations: Flexible sigmoidoscopy with +/- colonic stent  placement  Adenocarcinoma of the colon Metastatic adenocarcinoma to liver Recently diagnosed. Currently follows with medical oncology, Dr. [name] and is on active chemotherapy treatment.  Chronic systolic heart failure EF of 40-45%. Patient is euvolemic on admission. On metoprolol (Lopressor) as an outpatient. Patient is not on diuretic therapy. Rales noted on exam but chest x-ray without evidence of effusion or edema.  -Watch fluid status; daily weights and strict in/out  History of CABG CAD Patient was previously on aspirin which was discontinued secondary to rectal bleeding.  Diabetes mellitus, type 2 Patient is on glipizide as an outpatient. Last hemoglobin A1C of 7.8%.  -Continue SSI  CKD stage II Baseline creatinine of 1.2. Trended up. -Watch BMP daily   DVT prophylaxis: SCDs Code Status:   Code Status: Full Code Family Communication: None at bedside Disposition Plan: Discharge date unknown at this time. Pending continued clinical workup/management of bowel obstruction and afib with RVR. Anticipate discharge home.   Consultants:   Cardiology (6/8)  General surgery (6/9)  Gastroenterology (6/9)  Procedures:   None  Antimicrobials:  None    Subjective: No abdominal pain. Patient states he has not passed gas but per nursing, patient had a tiny BM overnight.  Objective: Vitals:   07/11/19 2342 07/12/19 0000 07/12/19 0400 07/12/19 0700  BP:  (!) 137/53 122/69 135/66  Pulse:  97 83 65  Resp:  19 19 17   Temp: 98 F (36.7 C)  98.2 F (36.8 C)   TempSrc: Axillary  Axillary   SpO2:  92% 96% 99%  Weight:      Height:        Intake/Output Summary (Last 24 hours) at 07/12/2019 0950 Last data filed at 07/12/2019 0616 Gross per 24 hour  Intake  1613.81 ml  Output 550 ml  Net 1063.81 ml   Filed Weights   07/10/19 1700 07/11/19 0500  Weight: 99 kg 99.5 kg    Examination:  General exam: Appears calm and comfortable Respiratory system: Clear to  auscultation. Respiratory effort normal. Cardiovascular system: S1 & S2 heard, irregular rhythm with normal rate. Gastrointestinal system: Abdomen is slightly distended, soft and nontender. Decreased bowel sounds heard. Central nervous system: Alert and oriented. No focal neurological deficits. Musculoskeletal: No edema. No calf tenderness Skin: No cyanosis. No rashes Psychiatry: Judgement and insight appear normal. Affect is flat   Data Reviewed: I have personally reviewed following labs and imaging studies  CBC Lab Results  Component Value Date   WBC 11.5 (H) 07/12/2019   RBC 2.94 (L) 07/12/2019   HGB 8.4 (L) 07/12/2019   HCT 27.6 (L) 07/12/2019   MCV 93.9 07/12/2019   MCH 28.6 07/12/2019   PLT 176 07/12/2019   MCHC 30.4 07/12/2019   RDW 19.9 (H) 07/12/2019   LYMPHSABS 0.6 (L) 07/09/2019   MONOABS 1.5 (H) 07/09/2019   EOSABS 0.0 07/09/2019   BASOSABS 0.1 63/78/5885     Last metabolic panel Lab Results  Component Value Date   NA 138 07/12/2019   K 4.0 07/12/2019   CL 102 07/12/2019   CO2 27 07/12/2019   BUN 36 (H) 07/12/2019   CREATININE 1.17 07/12/2019   GLUCOSE 92 07/12/2019   GFRNONAA >60 07/12/2019   GFRAA >60 07/12/2019   CALCIUM 8.1 (L) 07/12/2019   PHOS 2.6 05/02/2019   PROT 5.6 (L) 07/12/2019   ALBUMIN 2.7 (L) 07/12/2019   BILITOT 0.7 07/12/2019   ALKPHOS 112 07/12/2019   AST 15 07/12/2019   ALT 11 07/12/2019   ANIONGAP 9 07/12/2019    CBG (last 3)  Recent Labs    07/11/19 1634 07/11/19 2332 07/12/19 0748  GLUCAP 94 72 93     GFR: Estimated Creatinine Clearance: 73 mL/min (by C-G formula based on SCr of 1.17 mg/dL).  Coagulation Profile: No results for input(s): INR, PROTIME in the last 168 hours.  Recent Results (from the past 240 hour(s))  SARS Coronavirus 2 by RT PCR (hospital order, performed in [facility] hospital lab) Nasopharyngeal Nasopharyngeal Swab     Status: None   Collection Time: 07/09/19 12:44 PM   Specimen:  Nasopharyngeal Swab  Result Value Ref Range Status   SARS Coronavirus 2 NEGATIVE NEGATIVE Final    Comment: (NOTE) SARS-CoV-2 target nucleic acids are NOT DETECTED. The SARS-CoV-2 RNA is generally detectable in upper and lower respiratory specimens during the acute phase of infection. The lowest concentration of SARS-CoV-2 viral copies this assay can detect is 250 copies / mL. A negative result does not preclude SARS-CoV-2 infection and should not be used as the sole basis for treatment or other patient management decisions.  A negative result may occur with improper specimen collection / handling, submission of specimen other than nasopharyngeal swab, presence of viral mutation(s) within the areas targeted by this assay, and inadequate number of viral copies (<250 copies / mL). A negative result must be combined with clinical observations, patient history, and epidemiological information. Fact Sheet for Patients:   StrictlyIdeas.no Fact Sheet for Healthcare Providers: BankingDealers.co.za This test is not yet approved or cleared  by the [place] FDA and has been authorized for detection and/or diagnosis of SARS-CoV-2 by FDA under an Emergency Use Authorization (EUA).  This EUA will remain in effect (meaning this test can be used) for the duration  of the COVID-19 declaration under Section 564(b)(1) of the Act, 21 U.S.C. section 360bbb-3(b)(1), unless the authorization is terminated or revoked sooner. Performed at [facility], [place] [address] [place], [place] [address]   MRSA PCR Screening     Status: None   Collection Time: 07/09/19  7:53 PM   Specimen: Nasal Mucosa; Nasopharyngeal  Result Value Ref Range Status   MRSA by PCR NEGATIVE NEGATIVE Final    Comment:        The GeneXpert MRSA Assay (FDA approved for NASAL specimens only), is one component of a comprehensive MRSA colonization surveillance program.  It is not intended to diagnose MRSA infection nor to guide or monitor treatment for MRSA infections. Performed at [facility], [place] [address] [place], [place] [address]         Radiology Studies: DG Abd 1 View  Result Date: 07/10/2019 CLINICAL DATA:  Nasogastric tube placement. EXAM: ABDOMEN - 1 VIEW COMPARISON:  July 09, 2019 (1:53 p.m.) FINDINGS: Multiple sternal wires and sternal fixation plates and screws are seen. A nasogastric tube is seen with its distal tip overlying the body of the stomach. Numerous dilated small bowel loops are again seen throughout the abdomen and pelvis. No radio-opaque calculi or other significant radiographic abnormality are seen. IMPRESSION: Nasogastric tube positioning, as described above. Electronically Signed   By: Virgina Norfolk M.D.   On: 07/10/2019 17:56   CT ABDOMEN PELVIS W CONTRAST  Result Date: 07/10/2019 CLINICAL DATA:  Rectal tumor. Abdominal distention. No bowel movement for days. EXAM: CT ABDOMEN AND PELVIS WITH CONTRAST TECHNIQUE: Multidetector CT imaging of the abdomen and pelvis was performed using the standard protocol following bolus administration of intravenous contrast. CONTRAST:  194mL OMNIPAQUE IOHEXOL 300 MG/ML  SOLN COMPARISON:  03/15/2019 FINDINGS: Lower chest: Atelectasis and patchy airspace opacity in the lower lungs. Atherosclerosis and postoperative heart. Hepatobiliary: Multifocal hepatic metastatic disease with ill-defined masses measuring up to 4 cm in the posterior right liver, roughly 5 mm smaller than the before. Evidence of biliary obstruction or stone. Pancreas: Diffuse fatty atrophy with some preservation of density at the tail. There could be a cyst at the uncinate process, incidental in this setting. Spleen: Unremarkable. Adrenals/Urinary Tract: Negative adrenals. No hydronephrosis or stone. Simple bilateral renal cysts. Unremarkable bladder. Stomach/Bowel: Known sigmoid mass with severe luminal  stenosis. The adjacent wall thickening is less thickened, but there is still spiculated density extending through the serosa into the mesentery. Above this narrowing there is generalized gas and fluid dilated bowel. Vascular/Lymphatic: No acute vascular finding. Decrease in size of IMA lymph nodes. Reproductive:No pathologic findings. Other: No ascites or pneumoperitoneum. Musculoskeletal: No acute abnormalities. IMPRESSION: 1. 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 2. Multifocal hepatic metastatic disease which is stable or slightly improved from February 2021. Electronically Signed   By: Monte Fantasia M.D.   On: 07/10/2019 11:32        Scheduled Meds: . Chlorhexidine Gluconate Cloth  6 each Topical Q0600  . digoxin  0.125 mg Intravenous Daily  . insulin aspart  0-15 Units Subcutaneous TID WC  . insulin aspart  0-5 Units Subcutaneous QHS  . metoprolol tartrate  5 mg Intravenous Q6H  . pantoprazole (PROTONIX) IV  40 mg Intravenous QHS  . sodium chloride flush  10-40 mL Intracatheter Q12H   Continuous Infusions: . dextrose 5 % and 0.9% NaCl 75 mL/hr at 07/12/19 0400  .  diltiazem (CARDIZEM) infusion 5 mg/hr (07/12/19 5003)     LOS: 3 days     Cordelia Poche, MD [facility] 07/12/2019, 9:50 AM  If 7PM-7AM, please contact night-coverage [email or web address]

## 2019-07-12 NOTE — Op Note (Signed)
[facility] Patient Name: Clarence Andrews Procedure Date: 07/12/2019 MRN: [id number] Attending MD: Justice Britain , MD Date of Birth: [birth year] CSN: [id number] Age: 69 Admit Type: Inpatient Procedure:                Flexible Sigmoidoscopy Indications:              Colon cancer, For therapy of colon cancer, Colonic                            obstruction, For therapy of colonic obstruction Providers:                Justice Britain, MD, Vista Lawman, RN, Theodora Blow, Technician Referring MD:             Pricilla Riffle. Fuller Plan, MD, Izola Price. Roque Cash.                            Redmond Pulling MD, MD, [facility] Medicines:                General Anesthesia Complications:            No immediate complications. Estimated Blood Loss:     Estimated blood loss was minimal. Procedure:                Pre-Anesthesia Assessment:                           - Prior to the procedure, a History and Physical                            was performed, and patient medications and                            allergies were reviewed. The patient's tolerance of                            previous anesthesia was also reviewed. The risks                            and benefits of the procedure and the sedation                            options and risks were discussed with the patient.                            All questions were answered, and informed consent                            was obtained. Prior Anticoagulants: The patient has                            taken Eliquis (apixaban), last dose was 4 days  prior to procedure. ASA Grade Assessment: IV - A                            patient with severe systemic disease that is a                            constant threat to life. After reviewing the risks                            and benefits, the patient was deemed in                            satisfactory condition to undergo the  procedure.                           After obtaining informed consent, the scope was                            passed under direct vision. The [id number]                            ([id number]) Olympus ultra slim endoscope was                            introduced through the anus and advanced to the the                            descending colon. The [id number] ([id number]) Olympus                            therapeutic endoscope was introduced through the                            anus and advanced to the the rectosigmoid junction.                            The flexible sigmoidoscopy was technically                            difficult and complex due to bowel stenosis.                            Successful completion of the procedure was aided by                            performing the maneuvers documented (below) in this                            report. The quality of the bowel preparation was                            fair. The patient tolerated the procedure. Scope In: Scope Out: Findings:      The digital rectal exam  findings include hemorrhoids. Pertinent       negatives include no palpable rectal lesions.      A fungating, infiltrative and ulcerated completely obstructing large       mass was found from 17 to 20 cm proximal to the anus. The mass was       circumferential. The mass measured five cm in length. In addition, its       diameter measured five mm. Oozing was present      With gentle pressure, the ultraslim endoscope was able to traverse the       noted malignancy. The lumen of the sigmoid colon proximal to the       stenosis was grossly dilated and was full of stool.      Placement of a long 0.035 inch Stiff Jagwire was attempted. This passed       successfully while using the ultraslim endoscope. I then backloaded the       wire over a sphincterotome while using the therapeutic single channel       endoscope. We proceeded with contrast injection to outline the  stenosis       further on fluroscopy. Decision made to proceed with attempt at enteral       stenting. This was stented with a 22 mm x 9 cm WallFlex stent over the       wire with at least 2 cm of stent proximal and distal to the stenosis.       Liquid brown stool was able to traverse the region into the rectum. Impression:               - Preparation of the colon was fair but inadequate                            for appropriate screening purposes but not for                            diagnostic/therapeutic intention..                           - Hemorrhoids found on digital rectal exam.                           - Malignant completely obstructing tumor from 17 to                            20 cm proximal to the anus.                           - Dilated in the sigmoid colon proximal to the                            obstructing colonic mass.                           - Wire placed beyond the stenosis, area injected                            with radiographic contrast to delineate stenosis  further. Prosthesis placed. Moderate Sedation:      Not Applicable - Patient had care per Anesthesia. Recommendation:           - The patient will be observed post-procedure,                            until all discharge criteria are met.                           - Patient to the PACU for recovery.                           - Return patient to hospital ward for ongoing care                            thereafter.                           - Observe clinical status.                           - May trial sips of clear liquids the rest of today                            but maintain NGT in place but can try to cap for                            now.                           - If patient tolerates the sips and KUB tomorrow                            (already ordered) looks to have enteral stent still                            in good position, then would allow clear liquids  to                            advance to full liquids throughout Saturday. On                            Sunday, if patient is doing well, then may                            transition to soft/pureed foods for next 48 hours.                            Then may transition to a low-residue diet moving                            forward. Stop all fiber supplementation.                           -  Recommend that patient be initiated on Colace 200                            mg 1-2 times daily on Saturday. Would also                            recommend Miralax 1-2 capfuls daily in effort of                            trying to optimize stool consistency and not allow                            stent to become clogged in the coming days.                           - Stent should fully expand over the next 2-weeks.                           - The findings and recommendations were discussed                            with the patient.                           - The findings and recommendations were discussed                            with the referring physician.                           - The findings and recommendations were discussed                            with the Hospital Team. Procedure Code(s):        --- Professional ---                           [phone], 33, Sigmoidoscopy, flexible; with placement                            of endoscopic stent (includes pre- and                            post-dilation and guide wire passage, when                            performed) Diagnosis Code(s):        --- Professional ---                           K64.9, Unspecified hemorrhoids                           C18.8, Malignant neoplasm of overlapping sites of  colon                           K56.691, Other complete intestinal obstruction                           K59.39, Other megacolon                           C18.9, Malignant neoplasm of colon, unspecified                            K56.609, Unspecified intestinal obstruction,                            unspecified as to partial versus complete                            obstruction CPT copyright 2019 American Medical Association. All rights reserved. The codes documented in this report are preliminary and upon coder review may  be revised to meet current compliance requirements. Justice Britain, MD 07/12/2019 5:16:06 PM Number of Addenda: 0

## 2019-07-12 NOTE — Interval H&P Note (Signed)
History and Physical Interval Note:  07/12/2019 3:10 PM  Clarence Nip.  has presented today for surgery, with the diagnosis of Colonic obstruction.  The various methods of treatment have been discussed with the patient and family. After consideration of risks, benefits and other options for treatment, the patient has consented to  Procedure(s): FLEXIBLE SIGMOIDOSCOPY (N/A) COLONIC STENT PLACEMENT (N/A) as a surgical intervention.  The patient's history has been reviewed, patient examined, no change in status, stable for surgery.  I have reviewed the patient's chart and labs.  Questions were answered to the patient's satisfaction.    This is an increased risk procedure as a result of the Large bowel obstruction that we presume is from the colonic adenocarcinoma.  Long conversation about the potential risks The risks and benefits of endoscopic evaluation were discussed with the patient; these include but are not limited to the risk of perforation, infection, bleeding, missed lesions, lack of diagnosis, severe illness requiring hospitalization, as well as anesthesia and sedation related illnesses.  The risk of perforation is as high as 5-10% in palliative stenting during procedure or post-procedure.  He understands the potential for risk of needing urgent surgery if a complication should occur.  The patient is agreeable to proceed.    [employer]

## 2019-07-12 NOTE — Progress Notes (Signed)
Progress Note  Patient Name: Clarence Andrews. Date of Encounter: 07/12/2019  Primary Cardiologist: Mertie Moores, MD   Subjective   Feels a little better this am.  Getting stent today in colon.  HR much improved and for the most part in the 70-80's  Inpatient Medications    Scheduled Meds: . Chlorhexidine Gluconate Cloth  6 each Topical Q0600  . digoxin  0.125 mg Intravenous Daily  . insulin aspart  0-15 Units Subcutaneous TID WC  . insulin aspart  0-5 Units Subcutaneous QHS  . metoprolol tartrate  5 mg Intravenous Q6H  . pantoprazole (PROTONIX) IV  40 mg Intravenous QHS  . sodium chloride flush  10-40 mL Intracatheter Q12H   Continuous Infusions: . dextrose 5 % and 0.9% NaCl 75 mL/hr at 07/12/19 0400  . diltiazem (CARDIZEM) infusion 5 mg/hr (07/12/19 0637)   PRN Meds: alum & mag hydroxide-simeth, levalbuterol, ondansetron **OR** ondansetron (ZOFRAN) IV, phenol, polyethylene glycol, sodium chloride flush   Vital Signs    Vitals:   07/11/19 2342 07/12/19 0000 07/12/19 0400 07/12/19 0700  BP:  (!) 137/53 122/69 135/66  Pulse:  97 83 65  Resp:  19 19 17   Temp: 98 F (36.7 C)  98.2 F (36.8 C)   TempSrc: Axillary  Axillary   SpO2:  92% 96% 99%  Weight:      Height:        Intake/Output Summary (Last 24 hours) at 07/12/2019 0916 Last data filed at 07/12/2019 4259 Gross per 24 hour  Intake 1613.81 ml  Output 550 ml  Net 1063.81 ml   Filed Weights   07/10/19 1700 07/11/19 0500  Weight: 99 kg 99.5 kg    Telemetry    Aflutter with variable block in the 70-80's- Personally Reviewed  ECG    NO new EKG to review - Personally Reviewed  Physical Exam  GEN: Well nourished, well developed in no acute distress HEENT: Normal NECK: No JVD; No carotid bruits LYMPHATICS: No lymphadenopathy CARDIAC:irergulalry irregular, no murmurs, rubs, gallops RESPIRATORY:  Clear to auscultation without rales, wheezing or rhonchi  ABDOMEN: Soft, non-tender,  non-distended MUSCULOSKELETAL:  No edema; No deformity  SKIN: Warm and dry NEUROLOGIC:  Alert and oriented x 3 PSYCHIATRIC:  Normal affect    Labs    Chemistry Recent Labs  Lab 07/10/19 0346 07/11/19 0445 07/12/19 0504  NA 137 139 138  K 4.2 3.9 4.0  CL 100 100 102  CO2 27 28 27   GLUCOSE 181* 125* 92  BUN 37* 44* 36*  CREATININE 1.18 1.50* 1.17  CALCIUM 8.6* 8.4* 8.1*  PROT 6.5 5.7* 5.6*  ALBUMIN 3.2* 2.8* 2.7*  AST 18 16 15   ALT 14 11 11   ALKPHOS 146* 124 112  BILITOT 0.9 0.6 0.7  GFRNONAA >60 46* >60  GFRAA >60 54* >60  ANIONGAP 10 11 9      Hematology Recent Labs  Lab 07/10/19 0346 07/11/19 0445 07/12/19 0504  WBC 8.6 8.8 11.5*  RBC 3.44* 2.98* 2.94*  HGB 9.9* 8.6* 8.4*  HCT 31.8* 28.1* 27.6*  MCV 92.4 94.3 93.9  MCH 28.8 28.9 28.6  MCHC 31.1 30.6 30.4  RDW 20.3* 20.8* 19.9*  PLT 158 183 176    Cardiac EnzymesNo results for input(s): TROPONINI in the last 168 hours. No results for input(s): TROPIPOC in the last 168 hours.   BNPNo results for input(s): BNP, PROBNP in the last 168 hours.   DDimer No results for input(s): DDIMER in the last 168 hours.  Radiology    DG Abd 1 View  Result Date: 07/10/2019 CLINICAL DATA:  Nasogastric tube placement. EXAM: ABDOMEN - 1 VIEW COMPARISON:  July 09, 2019 (1:53 p.m.) FINDINGS: Multiple sternal wires and sternal fixation plates and screws are seen. A nasogastric tube is seen with its distal tip overlying the body of the stomach. Numerous dilated small bowel loops are again seen throughout the abdomen and pelvis. No radio-opaque calculi or other significant radiographic abnormality are seen. IMPRESSION: Nasogastric tube positioning, as described above. Electronically Signed   By: Virgina Norfolk M.D.   On: 07/10/2019 17:56   CT ABDOMEN PELVIS W CONTRAST  Result Date: 07/10/2019 CLINICAL DATA:  Rectal tumor. Abdominal distention. No bowel movement for days. EXAM: CT ABDOMEN AND PELVIS WITH CONTRAST TECHNIQUE:  Multidetector CT imaging of the abdomen and pelvis was performed using the standard protocol following bolus administration of intravenous contrast. CONTRAST:  187mL OMNIPAQUE IOHEXOL 300 MG/ML  SOLN COMPARISON:  03/15/2019 FINDINGS: Lower chest: Atelectasis and patchy airspace opacity in the lower lungs. Atherosclerosis and postoperative heart. Hepatobiliary: Multifocal hepatic metastatic disease with ill-defined masses measuring up to 4 cm in the posterior right liver, roughly 5 mm smaller than the before. Evidence of biliary obstruction or stone. Pancreas: Diffuse fatty atrophy with some preservation of density at the tail. There could be a cyst at the uncinate process, incidental in this setting. Spleen: Unremarkable. Adrenals/Urinary Tract: Negative adrenals. No hydronephrosis or stone. Simple bilateral renal cysts. Unremarkable bladder. Stomach/Bowel: Known sigmoid mass with severe luminal stenosis. The adjacent wall thickening is less thickened, but there is still spiculated density extending through the serosa into the mesentery. Above this narrowing there is generalized gas and fluid dilated bowel. Vascular/Lymphatic: No acute vascular finding. Decrease in size of IMA lymph nodes. Reproductive:No pathologic findings. Other: No ascites or pneumoperitoneum. Musculoskeletal: No acute abnormalities. IMPRESSION: 1. 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 2. Multifocal hepatic metastatic disease which is stable or slightly improved from February 2021. Electronically Signed   By: Monte Fantasia M.D.   On: 07/10/2019 11:32    Cardiac Studies   2D echo 04/28/2019 IMPRESSIONS   1. Difficult to assess LV systolic function given tachycardia and poor  windows, but appears mildly to moderately reduced. Left ventricular  ejection fraction, by estimation, is 40 to 45%. Would consider repeat TTE  with  contrast once heart rate better  controlled. There is moderate left ventricular hypertrophy. Left  ventricular diastolic parameters are indeterminate.  2. Right ventricle is poorly visualized, but function appears moderately  reduced  3. The mitral valve is normal in structure. No evidence of mitral valve  regurgitation.  4. The aortic valve is tricuspid. Aortic valve regurgitation is not  visualized. No aortic stenosis is present.  5. Aortic dilatation noted. 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  6. The inferior vena cava is dilated in size with <50% respiratory  variability, suggesting right atrial pressure of 15 mmHg.   Patient Profile     69 y.o. male with a hx of ASCAD s/p CABG in 2017, PAF, chronic systolic CHF, DM, Thoracic aortic aneurysm, throat/tongue CA and metastatic colorectal CA who is being seen today for the evaluation of atrial fibrillation with RVR at the request of Dr. [name].   Assessment & Plan    1. Persistent atrial flutter/fibrillation now with RVR -he has a hx of atrial fibrillation but not  had not been chronically anticoagulated despite a CHADS2VASC score of 4 due to ongoing GI bleeding from colorectal CA -he was started on Eliquis 5mg  BID in early April prior to d/c home after hospitalization for COVID but subsequently stopped by Dr. [name] due to to high bleeding risk -now admitted to ER with aflutter with RVR -PO lopressor stopped due to bowel obstruction and N/V -HR improved on Lopressor 5mg  IV q6 hours, IV Cardizem gtt and Digoxin 0.125mg  IV -IV Amio stopped due to N/V -of note I spoke with Dr. [name] and he says that patient is very high risk for ongoing rectal bleeding >>apparently the patient was still taking his Eliquis at home.  This has been stopped after conversation with Dr. [name] in regards to high risk of bleeding -Dr. [name] stated in notes today that potentially could try Coumadin but I am concerned that  his INRs may be very erratic due to issues with nausea and may have higher bleeding risk on Coumadin if INRs are difficult to maintain in therapeutic range if he cannot maintain steady diet.  DOACs have lower bleeding risk than Coumadin but there is no reversal agent for Eliquis which would be the safest. -Will keep off anticoagulation for now since I think the risk of bleeding outweighs the benefits with known metastatic dz and chronic anemia -continue Cardizem gtt at 5mg /hr, Lopressor 5mg  IV q6 hours and IV dig 0.125mg  daily.  2.  ASCAD -s/p remote CABG -denies any anginal sx -PO BB changed to IV due to N/V and bowel obstruction -currently not on statin therapy -no ASA due to bleeding issues with colon  3.  Chronic systolic CHF -EF 41-74% on echo 04/2019 -? Tachy mediated -does not appear volume overloaded on exam today -he has not tolerated ACE or ARB in the past due to soft BP  4.  Malignant neoplasm of sigmoid colon -per oncology -S/P 4 cycles of FOLFOX     For questions or updates, please contact [place] Please consult [email or web address] for contact info under Cardiology/STEMI.      Signed, Fransico Him, MD  07/12/2019, 9:16 AM

## 2019-07-12 NOTE — Anesthesia Postprocedure Evaluation (Signed)
Anesthesia Post Note  Patient: Mason Dibiasio.  Procedure(s) Performed: FLEXIBLE SIGMOIDOSCOPY (N/A ) COLONIC STENT PLACEMENT (N/A )     Patient location during evaluation: PACU Anesthesia Type: General Level of consciousness: awake and alert Pain management: pain level controlled Vital Signs Assessment: post-procedure vital signs reviewed and stable Respiratory status: spontaneous breathing, nonlabored ventilation, respiratory function stable and patient connected to nasal cannula oxygen Cardiovascular status: blood pressure returned to baseline and stable Postop Assessment: no apparent nausea or vomiting Anesthetic complications: no   No complications documented.  Last Vitals:  Vitals:   07/12/19 1745 07/12/19 1815  BP: 124/71 (!) 156/52  Pulse: 63 63  Resp: 16 17  Temp: (!) 36.3 C   SpO2: 100% 99%    Last Pain:  Vitals:   07/12/19 1745  TempSrc:   PainSc: 0-No pain                 Catalina Gravel

## 2019-07-12 NOTE — Progress Notes (Addendum)
HEMATOLOGY-ONCOLOGY PROGRESS NOTE  SUBJECTIVE: Denies abdominal pain.  No nausea or vomiting reported.  NG tube still in place.  No bowel movement.  For colorectal stent placement later today.  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[place])  04/02/2019 Initial Diagnosis   Cancer of left colon ([place])   04/11/2019 -  Chemotherapy   The patient had palonosetron (ALOXI) injection 0.25 mg, 0.25 mg, Intravenous,  Once, 4 of 6 cycles Administration: 0.25 mg (04/11/2019), 0.25 mg (05/28/2019), 0.25 mg (06/11/2019), 0.25 mg (06/25/2019) pegfilgrastim-cbqv (UDENYCA) injection 6 mg, 6 mg, Subcutaneous, Once, 3 of 5 cycles Administration: 6 mg (05/30/2019), 6 mg (06/13/2019), 6 mg (06/27/2019) leucovorin 972 mg in dextrose 5 % 250 mL infusion, 400 mg/m2 = 972 mg, Intravenous,  Once, 4 of 6 cycles Administration: 972 mg (04/11/2019), 972 mg (05/28/2019), 972 mg (06/11/2019), 972 mg (06/25/2019) oxaliplatin (ELOXATIN) 200 mg in dextrose 5 % 500 mL chemo infusion, 83 mg/m2 = 205 mg, Intravenous,  Once, 4 of 6 cycles Administration: 200 mg (04/11/2019), 200 mg (05/28/2019), 200 mg (06/11/2019), 200 mg (06/25/2019) fluorouracil (ADRUCIL) chemo injection 950 mg, 400 mg/m2 = 950 mg, Intravenous,  Once, 1 of 1 cycle Administration: 950 mg (04/11/2019) fluorouracil (ADRUCIL) 5,850 mg in sodium chloride 0.9 % 133 mL chemo infusion, 2,400 mg/m2 = 5,850 mg, Intravenous, 1 Day/Dose, 4 of 6 cycles Dose modification: 2,000 mg/m2 (original dose 2,400 mg/m2, Cycle 2, Reason: Provider Judgment) Administration: 5,850 mg (04/11/2019), 4,850 mg (05/28/2019), 4,850 mg (06/11/2019), 4,850 mg (06/25/2019)  for chemotherapy treatment.    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[place])  04/02/2019 Initial Diagnosis   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[place])   04/11/2019 -  Chemotherapy   The patient had palonosetron (ALOXI) injection 0.25 mg, 0.25 mg, Intravenous,  Once, 4 of 6 cycles Administration: 0.25 mg (04/11/2019), 0.25 mg (05/28/2019), 0.25 mg  (06/11/2019), 0.25 mg (06/25/2019) pegfilgrastim-cbqv (UDENYCA) injection 6 mg, 6 mg, Subcutaneous, Once, 3 of 5 cycles Administration: 6 mg (05/30/2019), 6 mg (06/13/2019), 6 mg (06/27/2019) leucovorin 972 mg in dextrose 5 % 250 mL infusion, 400 mg/m2 = 972 mg, Intravenous,  Once, 4 of 6 cycles Administration: 972 mg (04/11/2019), 972 mg (05/28/2019), 972 mg (06/11/2019), 972 mg (06/25/2019) oxaliplatin (ELOXATIN) 200 mg in dextrose 5 % 500 mL chemo infusion, 83 mg/m2 = 205 mg, Intravenous,  Once, 4 of 6 cycles Administration: 200 mg (04/11/2019), 200 mg (05/28/2019), 200 mg (06/11/2019), 200 mg (06/25/2019) fluorouracil (ADRUCIL) chemo injection 950 mg, 400 mg/m2 = 950 mg, Intravenous,  Once, 1 of 1 cycle Administration: 950 mg (04/11/2019) fluorouracil (ADRUCIL) 5,850 mg in sodium chloride 0.9 % 133 mL chemo infusion, 2,400 mg/m2 = 5,850 mg, Intravenous, 1 Day/Dose, 4 of 6 cycles Dose modification: 2,000 mg/m2 (original dose 2,400 mg/m2, Cycle 2, Reason: Provider Judgment) Administration: 5,850 mg (04/11/2019), 4,850 mg (05/28/2019), 4,850 mg (06/11/2019), 4,850 mg (06/25/2019)  for chemotherapy treatment.      PHYSICAL EXAMINATION:  Vitals:   07/12/19 1200 07/12/19 1300  BP: (!) 143/68 (!) 151/71  Pulse: (!) 51 89  Resp: 17 15  Temp: 97.8 F (36.6 C)   SpO2: 99% 100%   Filed Weights   07/10/19 1700 07/11/19 0500  Weight: 99 kg 99.5 kg    Intake/Output from previous day: 06/10 0701 - 06/11 0700 In: 1613.8 [I.V.:1613.8] Out: 550 [Urine:550]  GENERAL:alert, no distress and comfortable HEENT: NG tube in place, white coating on tongue LUNGS: Rales to the bases HEART: Irregular, no lower extremity edema ABDOMEN:abdomen soft, non-tender  NEURO: alert &  oriented x 3 with fluent speech, no focal motor/sensory deficits  LABORATORY DATA:  I have reviewed the data as listed CMP Latest Ref Rng & Units 07/12/2019 07/11/2019 07/10/2019  Glucose 70 - 99 mg/dL 92 125(H) 181(H)  BUN 8 - 23 mg/dL 36(H)  44(H) 37(H)  Creatinine 0.61 - 1.24 mg/dL 1.17 1.50(H) 1.18  Sodium 135 - 145 mmol/L 138 139 137  Potassium 3.5 - 5.1 mmol/L 4.0 3.9 4.2  Chloride 98 - 111 mmol/L 102 100 100  CO2 22 - 32 mmol/L _0 Calcium 8.9 - 10.3 mg/dL 8.1(L) 8.4(L) 8.6(L)  Total Protein 6.5 - 8.1 g/dL 5.6(L) 5.7(L) 6.5  Total Bilirubin 0.3 - 1.2 mg/dL 0.7 0.6 0.9  Alkaline Phos 38 - 126 U/L 112 124 146(H)  AST 15 - 41 U/L _1 ALT 0 - 44 U/L _2 Lab Results  Component Value Date   WBC 11.5 (H) 07/12/2019   HGB 8.4 (L) 07/12/2019   HCT 27.6 (L) 07/12/2019   MCV 93.9 07/12/2019   PLT 176 07/12/2019   NEUTROABS 6.2 07/09/2019    DG Chest 1 View  Result Date: 07/09/2019 CLINICAL DATA:  Tachycardia and diffuse abdominal pain. EXAM: CHEST  1 VIEW COMPARISON:  04/28/2019 FINDINGS: Previous median sternotomy. Power port inserted from a right jugular approach with tip at the SVC RA junction. Heart size is upper limits of normal with some left ventricular prominence. No acute mediastinal finding. The pulmonary vascularity is normal. The lungs are clear. No infiltrate, collapse or effusion. IMPRESSION: No active disease. Electronically Signed   By: Nelson Chimes M.D.   On: 07/09/2019 14:21   DG Abd 1 View  Result Date: 07/10/2019 CLINICAL DATA:  Nasogastric tube placement. EXAM: ABDOMEN - 1 VIEW COMPARISON:  July 09, 2019 (1:53 p.m.) FINDINGS: Multiple sternal wires and sternal fixation plates and screws are seen. A nasogastric tube is seen with its distal tip overlying the body of the stomach. Numerous dilated small bowel loops are again seen throughout the abdomen and pelvis. No radio-opaque calculi or other significant radiographic abnormality are seen. IMPRESSION: Nasogastric tube positioning, as described above. Electronically Signed   By: Virgina Norfolk M.D.   On: 07/10/2019 17:56   DG Abdomen 1 View  Result Date: 07/09/2019 CLINICAL DATA:  Tachycardia.  Diffuse abdominal pain. EXAM: ABDOMEN - 1  VIEW COMPARISON:  None. FINDINGS: There is gas throughout small and large bowel most consistent with ileus. No focal dilatation to suggest obstruction. No free air. No abnormal calcifications. Ordinary degenerative changes affect the spine. IMPRESSION: Possible ileus pattern. No evidence of frank obstruction or focal lesion. Electronically Signed   By: Nelson Chimes M.D.   On: 07/09/2019 14:22   CT ABDOMEN PELVIS W CONTRAST  Result Date: 07/10/2019 CLINICAL DATA:  Rectal tumor. Abdominal distention. No bowel movement for days. EXAM: CT ABDOMEN AND PELVIS WITH CONTRAST TECHNIQUE: Multidetector CT imaging of the abdomen and pelvis was performed using the standard protocol following bolus administration of intravenous contrast. CONTRAST:  170m OMNIPAQUE IOHEXOL 300 MG/ML  SOLN COMPARISON:  03/15/2019 FINDINGS: Lower chest: Atelectasis and patchy airspace opacity in the lower lungs. Atherosclerosis and postoperative heart. Hepatobiliary: Multifocal hepatic metastatic disease with ill-defined masses measuring up to 4 cm in the posterior right liver, roughly 5 mm smaller than the before. Evidence of biliary obstruction or stone. Pancreas: Diffuse fatty atrophy with some preservation of density at the tail. There could be a cyst at the uncinate  process, incidental in this setting. Spleen: Unremarkable. Adrenals/Urinary Tract: Negative adrenals. No hydronephrosis or stone. Simple bilateral renal cysts. Unremarkable bladder. Stomach/Bowel: Known sigmoid mass with severe luminal stenosis. The adjacent wall thickening is less thickened, but there is still spiculated density extending through the serosa into the mesentery. Above this narrowing there is generalized gas and fluid dilated bowel. Vascular/Lymphatic: No acute vascular finding. Decrease in size of IMA lymph nodes. Reproductive:No pathologic findings. Other: No ascites or pneumoperitoneum. Musculoskeletal: No acute abnormalities. IMPRESSION: 1. Severe and  obstructing malignant stricture at the sigmoid colon where there is still extra serosal spiculated density extending into the mesentery. 2. Multifocal hepatic metastatic disease which is stable or slightly improved from February 2021. Electronically Signed   By: Monte Fantasia M.D.   On: 07/10/2019 11:32    ASSESSMENT AND PLAN: 1. 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 Colonoscopy 03/07/2019-nearly completely obstructing mass beginning at 16 cm from the anal verge, sigmoid colon polyp ? CTs 03/15/2019-wall thickening of the lower sigmoid colon, low rectal mass with significant luminal narrowing, diffuse liver metastases, borderline enlarged sigmoid mesocolon, iliac, and upper abdominal lymph nodes ? Elevated CEA ? 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 Cycle 1 FOLFOX 04/11/2019 ? Cycle 2 FOLFOX 05/28/2019 ? Cycle 3 FOLFOX 06/11/2019 ? Cycle 4 FOLFOX 06/25/2019 ? CT abdomen/pelvis 07/10/2027-severe obstructing stricture at the sigmoid colon, stable to slight improvement in hepatic metastatic disease compared to February 2021 2. 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 Diabetes 4. Coronary artery disease, status post coronary artery bypass surgery in 2017 5. Neutropenia following cycle 1 FOLFOX 6. Admission 04/28/2019 with COVID-19 infection 7. C. difficile colitis 04/28/2019 8. Atrial flutter during hospital admission March/April 2021-treated with a Cardizem drip, digoxin, and Eliquis anticoagulation. Converted to metoprolol at discharge. 9. 07/09/2019-hospital admission for bowel obstruction and A. Fib  Mr. Hands appears unchanged.  For colorectal stent placement later today by GI.  His anticoagulation is on hold  secondary to the rectal tumor with a history of bleeding.  We could consider him for Coumadin if cardiology feels anticoagulation is needed.  Recommendations: 1.  Proceed with colorectal stent placement as recommended by GI. 2.  Continue to hold anticoagulation for now.  Please call medical oncology over the weekend for questions.  We will follow up on Monday if the patient remains in the hospital.   LOS: 3 days   Mikey Bussing, DNP, AGPCNP-BC, AOCNP 07/12/19 I discussed the situation with Mr. O'Donnell this morning.  The plan is to resume systemic chemotherapy for treatment of the metastatic colon cancer depending on his recovery from the colonic obstruction.  I will check on him 07/15/2019.  Outpatient follow-up will be scheduled at the Cancer center.

## 2019-07-12 NOTE — Anesthesia Preprocedure Evaluation (Addendum)
Anesthesia Evaluation  Patient identified by MRN, date of birth, ID band Patient awake    Reviewed: Allergy & Precautions, NPO status , Patient's Chart, lab work & pertinent test results, reviewed documented beta blocker date and time   Airway Mallampati: II  TM Distance: >3 FB Neck ROM: Full    Dental  (+) Edentulous Upper, Edentulous Lower   Pulmonary neg pulmonary ROS,  H/o COVID 04/2019   Pulmonary exam normal breath sounds clear to auscultation       Cardiovascular + CAD, + CABG (2017) and +CHF  Normal cardiovascular exam+ dysrhythmias Atrial Fibrillation  Rhythm:Regular Rate:Normal  TTE 04/2019 1. Difficult to assess LV systolic function given tachycardia and poor windows, but appears mildly to moderately reduced. Left ventricular ejection fraction, by estimation, is 40 to 45%. Would consider repeat TTE with contrast once heart rate better controlled. There is moderate left ventricular hypertrophy. Left  ventricular diastolic parameters are indeterminate.  2. Right ventricle is poorly visualized, but function appears moderately reduced  3. The mitral valve is normal in structure. No evidence of mitral valve regurgitation.  4. The aortic valve is tricuspid. Aortic valve regurgitation is not visualized. No aortic stenosis is present.  5. Aortic dilatation noted. 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  6. The inferior vena cava is dilated in size with <50% respiratory variability, suggesting right atrial pressure of 15 mmHg.    Neuro/Psych negative neurological ROS  negative psych ROS   GI/Hepatic negative GI ROS, Neg liver ROS,   Endo/Other  negative endocrine ROSdiabetes  Renal/GU Renal InsufficiencyRenal disease  negative genitourinary   Musculoskeletal negative musculoskeletal ROS (+)   Abdominal   Peds  Hematology  (+) Blood dyscrasia (Hgb 8.4, on eliquis), anemia ,   Anesthesia Other  Findings Colon CA c/b large bowel obstruction  Presented on 07/09/19 from the cancer center where he was receiving chemo. Found to have afib with RVR. Now on cardizem gtt with rate controlled.   Reproductive/Obstetrics                           Anesthesia Physical Anesthesia Plan  ASA: III  Anesthesia Plan: General   Post-op Pain Management:    Induction: Intravenous and Rapid sequence  PONV Risk Score and Plan: 2 and Propofol infusion, Treatment may vary due to age or medical condition and Ondansetron  Airway Management Planned: Oral ETT and Video Laryngoscope Planned  Additional Equipment:   Intra-op Plan:   Post-operative Plan:   Informed Consent: I have reviewed the patients History and Physical, chart, labs and discussed the procedure including the risks, benefits and alternatives for the proposed anesthesia with the patient or authorized representative who has indicated his/her understanding and acceptance.     Dental advisory given  Plan Discussed with: CRNA  Anesthesia Plan Comments:       Anesthesia Quick Evaluation

## 2019-07-12 NOTE — Progress Notes (Signed)
Per chart review Clarence Andrews is going for flex sig, possible stent placement, today with Dr. Rush Landmark. Will follow results of that procedure. [facility] will make this patient PRN this weekend and follow up on Monday.   Obie Dredge, PA-C

## 2019-07-12 NOTE — Telephone Encounter (Signed)
Scheduled per 06/08 los, patient has been called and voicemail was left. 

## 2019-07-12 NOTE — Transfer of Care (Signed)
Immediate Anesthesia Transfer of Care Note  Patient: Clarence Andrews.  Procedure(s) Performed: FLEXIBLE SIGMOIDOSCOPY (N/A ) COLONIC STENT PLACEMENT (N/A )  Patient Location: PACU  Anesthesia Type:General  Level of Consciousness: awake, alert  and oriented  Airway & Oxygen Therapy: Patient Spontanous Breathing and Patient connected to face mask oxygen  Post-op Assessment: Report given to RN and Post -op Vital signs reviewed and stable  Post vital signs: Reviewed and stable  Last Vitals:  Vitals Value Taken Time  BP 139/76 07/12/19 1715  Temp    Pulse 40 07/12/19 1716  Resp 19 07/12/19 1716  SpO2 100 % 07/12/19 1716  Vitals shown include unvalidated device data.  Last Pain:  Vitals:   07/12/19 1712  TempSrc:   PainSc: (P) 0-No pain         Complications: No complications documented.

## 2019-07-13 ENCOUNTER — Inpatient Hospital Stay (HOSPITAL_COMMUNITY): Payer: Medicare HMO

## 2019-07-13 LAB — GLUCOSE, CAPILLARY
Glucose-Capillary: 127 mg/dL — ABNORMAL HIGH (ref 70–99)
Glucose-Capillary: 122 mg/dL — ABNORMAL HIGH (ref 70–99)
Glucose-Capillary: 181 mg/dL — ABNORMAL HIGH (ref 70–99)
Glucose-Capillary: 188 mg/dL — ABNORMAL HIGH (ref 70–99)

## 2019-07-13 LAB — CBC
HCT: 31.5 % — ABNORMAL LOW (ref 39.0–52.0)
Hemoglobin: 9.8 g/dL — ABNORMAL LOW (ref 13.0–17.0)
MCH: 28.8 pg (ref 26.0–34.0)
MCHC: 31.1 g/dL (ref 30.0–36.0)
MCV: 92.6 fL (ref 80.0–100.0)
Platelets: 224 10*3/uL (ref 150–400)
RBC: 3.4 MIL/uL — ABNORMAL LOW (ref 4.22–5.81)
RDW: 19.5 % — ABNORMAL HIGH (ref 11.5–15.5)
WBC: 12.8 10*3/uL — ABNORMAL HIGH (ref 4.0–10.5)
nRBC: 0.3 % — ABNORMAL HIGH (ref 0.0–0.2)

## 2019-07-13 LAB — MAGNESIUM: Magnesium: 1.9 mg/dL (ref 1.7–2.4)

## 2019-07-13 MED ORDER — ZINC OXIDE 40 % EX OINT
TOPICAL_OINTMENT | Freq: Every day | CUTANEOUS | Status: DC | PRN
  Filled 2019-07-13: qty 57

## 2019-07-13 NOTE — Progress Notes (Signed)
Progress Note  Patient Name: Clarence Andrews. Date of Encounter: 07/13/2019  Primary Cardiologist: Mertie Moores, MD   Subjective   No cardiac complaints   Inpatient Medications    Scheduled Meds: . Chlorhexidine Gluconate Cloth  6 each Topical Q0600  . digoxin  0.125 mg Intravenous Daily  . insulin aspart  0-15 Units Subcutaneous TID WC  . insulin aspart  0-5 Units Subcutaneous QHS  . metoprolol tartrate  5 mg Intravenous Q6H  . pantoprazole (PROTONIX) IV  40 mg Intravenous QHS  . sodium chloride flush  10-40 mL Intracatheter Q12H   Continuous Infusions: . diltiazem (CARDIZEM) infusion 5 mg/hr (07/13/19 0720)   PRN Meds: alum & mag hydroxide-simeth, fentaNYL (SUBLIMAZE) injection, levalbuterol, liver oil-zinc oxide, ondansetron **OR** ondansetron (ZOFRAN) IV, phenol, polyethylene glycol, sodium chloride flush   Vital Signs    Vitals:   07/13/19 0500 07/13/19 0600 07/13/19 0700 07/13/19 0800  BP:  (!) 140/58 129/65 127/67  Pulse:      Resp: (!) 24 (!) 23 20 (!) 22  Temp:    98.9 F (37.2 C)  TempSrc:      SpO2:      Weight: 99.5 kg     Height:        Intake/Output Summary (Last 24 hours) at 07/13/2019 1021 Last data filed at 07/13/2019 9476 Gross per 24 hour  Intake 1023.61 ml  Output 2020 ml  Net -996.39 ml   Filed Weights   07/10/19 1700 07/11/19 0500 07/13/19 0500  Weight: 99 kg 99.5 kg 99.5 kg    Telemetry    Aflutter with variable block in the 70-80's- Personally Reviewed  ECG    NO new EKG to review - Personally Reviewed  Physical Exam  Affect appropriate Chronically ill pale male  HEENT: normal Neck supple with no adenopathy Port a cath right IJ/subclavian  Lungs clear with no wheezing and good diaphragmatic motion Heart:  S1/S2 no murmur, no rub, gallop or click PMI normal Abdomen: benighn, BS positve, no tenderness, no AAA no bruit.  No HSM or HJR Distal pulses intact with no bruits No edema Neuro non-focal Skin warm and dry No  muscular weakness    Labs    Chemistry Recent Labs  Lab 07/10/19 0346 07/11/19 0445 07/12/19 0504  NA 137 139 138  K 4.2 3.9 4.0  CL 100 100 102  CO2 27 28 27   GLUCOSE 181* 125* 92  BUN 37* 44* 36*  CREATININE 1.18 1.50* 1.17  CALCIUM 8.6* 8.4* 8.1*  PROT 6.5 5.7* 5.6*  ALBUMIN 3.2* 2.8* 2.7*  AST 18 16 15   ALT 14 11 11   ALKPHOS 146* 124 112  BILITOT 0.9 0.6 0.7  GFRNONAA >60 46* >60  GFRAA >60 54* >60  ANIONGAP 10 11 9      Hematology Recent Labs  Lab 07/10/19 0346 07/11/19 0445 07/12/19 0504  WBC 8.6 8.8 11.5*  RBC 3.44* 2.98* 2.94*  HGB 9.9* 8.6* 8.4*  HCT 31.8* 28.1* 27.6*  MCV 92.4 94.3 93.9  MCH 28.8 28.9 28.6  MCHC 31.1 30.6 30.4  RDW 20.3* 20.8* 19.9*  PLT 158 183 176    Cardiac EnzymesNo results for input(s): TROPONINI in the last 168 hours. No results for input(s): TROPIPOC in the last 168 hours.   BNPNo results for input(s): BNP, PROBNP in the last 168 hours.   DDimer No results for input(s): DDIMER in the last 168 hours.   Radiology    DG C-Arm 1-60 Min-No Report  Result Date:  07/12/2019 Fluoroscopy was utilized by the requesting physician.  No radiographic interpretation.    Cardiac Studies   2D echo 04/28/2019 IMPRESSIONS   1. Difficult to assess LV systolic function given tachycardia and poor  windows, but appears mildly to moderately reduced. Left ventricular  ejection fraction, by estimation, is 40 to 45%. Would consider repeat TTE  with contrast once heart rate better  controlled. There is moderate left ventricular hypertrophy. Left  ventricular diastolic parameters are indeterminate.  2. Right ventricle is poorly visualized, but function appears moderately  reduced  3. The mitral valve is normal in structure. No evidence of mitral valve  regurgitation.  4. The aortic valve is tricuspid. Aortic valve regurgitation is not  visualized. No aortic stenosis is present.  5. Aortic dilatation noted. 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  6. The inferior vena cava is dilated in size with <50% respiratory  variability, suggesting right atrial pressure of 15 mmHg.   Patient Profile     69 y.o. male with a hx of ASCAD s/p CABG in 2017, PAF, chronic systolic CHF, DM, Thoracic aortic aneurysm, throat/tongue CA and metastatic colorectal CA who is being seen today for the evaluation of atrial fibrillation with RVR at the request of Dr. [name].   Assessment & Plan    1. Persistent atrial flutter/fibrillation now with RVR -rate control good no anticoagulation with chronic anemia and metastatic colon cancer recent obstruction  -continue Cardizem gtt at 5mg /hr, Lopressor 5mg  IV q6 hours and IV dig 0.125mg  daily.  2.  ASCAD -s/p remote CABG -denies any anginal sx -PO BB changed to IV due to N/V and bowel obstruction -currently not on statin therapy -no ASA due to bleeding issues with colon  3.  Chronic systolic CHF -EF 45-62% on echo 04/2019 -PRN diuresis - about a liter yesterday stable   4.  Malignant neoplasm of sigmoid colon -per oncology -S/P 4 cycles of FOLFOX - colonic obstruction post stent NG tube in  - port a cath access      For questions or updates, please contact [place] Please consult [email or web address] for contact info under Cardiology/STEMI.      Signed, Jenkins Rouge, MD  07/13/2019, 10:21 AM

## 2019-07-13 NOTE — Progress Notes (Addendum)
[facility] Progress Note  CC:  Sigmoid colon cancer causing obstruction  Subjective:  Feels good.  NGT has been clamped and no nausea, abdominal distention, or abdominal pain.  Says that he's had about 8 BMs.  Abdominal x-ray results pending.  Stent placed yesterday by Dr. Rush Landmark:  - Malignant completely obstructing tumor from 17 to 20 cm proximal to the anus. - Dilated in the sigmoid colon proximal to the obstructing colonic mass. - Wire placed beyond the stenosis, area injected with radiographic contrast to delineate stenosis further. Prosthesis placed.  Objective:  Vital signs in last 24 hours: Temp:  [97.4 F (36.3 C)-98.9 F (37.2 C)] 98.9 F (37.2 C) (06/12 0800) Pulse Rate:  [42-100] 63 (06/11 1815) Resp:  [4-26] 22 (06/12 0800) BP: (103-156)/(52-79) 127/67 (06/12 0800) SpO2:  [99 %-100 %] 99 % (06/11 1815) Weight:  [99.5 kg] 99.5 kg (06/12 0500) Last BM Date: 07/13/19 General:  Alert, Well-developed, in NAD Heart:  Irregularly irregular. Pulm:  CTAB.  No increased WOB. Abdomen:  Soft, non-distended.  BS present.  Non-tender.  Extremities:  Without edema. Neurologic:  Alert and oriented x 4;  grossly normal neurologically. Psych:  Alert and cooperative. Normal mood and affect.  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 Colonoscopy in February showed nearly completely obstructing mass beginning at 16 cm from the anal verge. On FOLFOX chemotherapy per Dr.Sherrill, completed cycle 4 on Jun 25, 2019 and supposed to receive cycle 5 todaywas held due to A. fib with RVR.  Colonic stent placed on 6/11 by Dr. Rush Landmark. *A. fib with RVR: Apparently was still taking Eliquis at home, last dose ? 6/8. *C. difficile colitis in March 2021 *Coronary artery disease status post CABG in 2017 *Type 2 diabetes mellitus with hemoglobin A1c 9.8 in March 2021 *History of head neck cancer treated in 2016 in [place] with surgery, chemo, radiation  - If KUB tomorrow looks to have enteral stent still in good position, then will allow clear liquids and removed NGT and advance to full liquids throughout Saturday. On Sunday, if patient is doing well, then may transition to soft/pureed foods for next 48 hours. Then may transition to a low-residue diet moving forward. Stop all fiber supplementation. - Will start Colace 200 mg 1-2 times daily.  Also recommend Miralax 1-2 capfuls daily once patient taking more PO in effort of trying to optimize stool consistency and not allow stent to become clogged in the coming days. - Stent should fully expand over the next 2 weeks.   LOS: 4 days   Laban Emperor. Zehr  07/13/2019, 9:20 AM     Attending Physician Note  I have taken an interval history, reviewed the chart and examined the patient. I agree with the Advanced Practitioner's note, impression and recommendations.   S/P colonic stent placement by Dr. Rush Landmark yesterday for an obstructing distal sigmoid colon cancer. Pt is significantly improved having multiple bowel movements and passing flatus.  Abd films personally reviewed and the stent is good position with expected waist noted. Formal radiology reading is pending at this time.   OK to remove NGT and advance to  full liquid diet today.  Further plans going forward as outlined above and as outlined in the procedure note. GI signing off. Inpatient GI service available if needed.   Lucio Edward, MD [facility]

## 2019-07-13 NOTE — Progress Notes (Signed)
PROGRESS NOTE    Clarence Nip.  GXQ:[id number] DOB: Jun 30, 1950 DOA: 07/09/2019 PCP: Patient, No Pcp Per   Brief Narrative: Clarence Seago. is a 69 y.o. male with a history of CAD s/p CABG, persistent atrial fibrillation/flutter not on anticoagulation secondary to rectal bleeding, chronic systolic heart failure, previous COVID-19 infection, metastatic colon cancer. Patient presented from his oncology office secondary to atrial fibrillation with RVR. While admitted he developed significant nausea and vomiting with CT evidence of obstruction of his sigmoid from is colonic mass.   Assessment & Plan:   Principal Problem:   Atrial fibrillation with RVR ([place]) Active Problems:   Cancer of left colon (HCC)   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HCC)   Type 2 diabetes mellitus without complication (HCC)   CAD (coronary artery disease)   Chronic systolic heart failure (HCC)   Large bowel obstruction (HCC)   Persistent atrial flutter/fibrillation with RVR Patient with a history of atrial fibrillation as an outpatient on metoprolol 100 mg BID and digoxin 0.125 mg daily. Patient's heart rate better controlled on regimen of Cardizem drip titrated to 5 mg/hr, digoxin 0.125 mg IV daily, metoprolol 5 mg IV q6 hours -Cardiology recommendations: Cardizem IV, digoxin IV, metoprolol IV; pending today  Large bowel obstruction Patient with no bowel movement in 5 days prior to admission. No prior history of obstruction but has had abdominal surgery in the past. History of adenocarcinoma of the colon as mentioned below. Abdominal x-ray (6/8) significant for possible ileus. CT abdomen/pelvis (6/9) obtained and significant for severe/obstructing malignant stricture at sigmoid colon. General surgery and GI consulted on 6/9. Patient feels better after NG tube placement. Having bowel movements after colonic stent placed -General surgery recommendations: NG tube placement, recommendations pending today -GI  recommendations: advance diet as able; stent placed 6/11  Adenocarcinoma of the colon Metastatic adenocarcinoma to liver Recently diagnosed. Currently follows with medical oncology, Dr. [name] and is on active chemotherapy treatment.  Chronic systolic heart failure EF of 40-45%. Patient is euvolemic on admission. On metoprolol (Lopressor) as an outpatient. Patient is not on diuretic therapy. Rales noted on exam but chest x-ray without evidence of effusion or edema.  -Watch fluid status; daily weights and strict in/out  History of CABG CAD Patient was previously on aspirin which was discontinued secondary to rectal bleeding.  Diabetes mellitus, type 2 Patient is on glipizide as an outpatient. Last hemoglobin A1C of 7.8%.  -Continue SSI  AKI on  CKD stage II Baseline creatinine of 1.2. Peak creatinine of 1.5 which is now back to baseline with IV fluids -Watch BMP daily   DVT prophylaxis: SCDs Code Status:   Code Status: Full Code Family Communication: None at bedside Disposition Plan: Discharge date likely in 2-3 days pending specialist sign off/recommendations   Consultants:   Cardiology (6/8)  General surgery (6/9)  Gastroenterology (6/9)  Procedures:   FLEXIBLE SIGMOIDOSCOPY; COLONIC STENT PLACEMENT (07/12/2019) Impression:               - Preparation of the colon was fair but inadequate                            for appropriate screening purposes but not for                            diagnostic/therapeutic intention..                           -  Hemorrhoids found on digital rectal exam.                           - Malignant completely obstructing tumor from 17 to                            20 cm proximal to the anus.                           - Dilated in the sigmoid colon proximal to the                            obstructing colonic mass.                           - Wire placed beyond the stenosis, area injected                            with radiographic  contrast to delineate stenosis                            further. Prosthesis placed.  Recommendation:           - The patient will be observed post-procedure,                            until all discharge criteria are met.                           - Patient to the PACU for recovery.                           - Return patient to hospital ward for ongoing care                            thereafter.                           - Observe clinical status.                           - May trial sips of clear liquids the rest of today                            but maintain NGT in place but can try to cap for                            now.                           - If patient tolerates the sips and KUB tomorrow                            (already ordered) looks to have enteral stent still  in good position, then would allow clear liquids to                            advance to full liquids throughout Saturday. On                            Sunday, if patient is doing well, then may                            transition to soft/pureed foods for next 48 hours.                            Then may transition to a low-residue diet moving                            forward. Stop all fiber supplementation.                           - Recommend that patient be initiated on Colace 200                            mg 1-2 times daily on Saturday. Would also                            recommend Miralax 1-2 capfuls daily in effort of                            trying to optimize stool consistency and not allow                            stent to become clogged in the coming days.                           - Stent should fully expand over the next 2-weeks.                           - The findings and recommendations were discussed                            with the patient.                           - The findings and recommendations were discussed                            with  the referring physician.                           - The findings and recommendations were discussed                            with the Hospital Team.  Antimicrobials:  None    Subjective: Has had bowel movements overnight. Passing gas. Wants NG tube  out.  Objective: Vitals:   07/13/19 0500 07/13/19 0600 07/13/19 0700 07/13/19 0800  BP:  (!) 140/58 129/65 127/67  Pulse:      Resp: (!) 24 (!) 23 20 (!) 22  Temp:    98.9 F (37.2 C)  TempSrc:      SpO2:      Weight: 99.5 kg     Height:        Intake/Output Summary (Last 24 hours) at 07/13/2019 0931 Last data filed at 07/13/2019 0821 Gross per 24 hour  Intake 1494.87 ml  Output 2020 ml  Net -525.13 ml   Filed Weights   07/10/19 1700 07/11/19 0500 07/13/19 0500  Weight: 99 kg 99.5 kg 99.5 kg    Examination:  General exam: Appears calm and comfortable Respiratory system: Clear to auscultation. Respiratory effort normal. Cardiovascular system: S1 & S2 heard, RRR. No murmurs, rubs, gallops or clicks. Gastrointestinal system: Abdomen is minimally distended, soft and nontender. No organomegaly or masses felt. Normal bowel sounds heard. Central nervous system: Alert and oriented. No focal neurological deficits. Musculoskeletal: No edema. No calf tenderness Skin: No cyanosis. No rashes Psychiatry: Judgement and insight appear normal. Mood & affect appropriate.    Data Reviewed: I have personally reviewed following labs and imaging studies  CBC Lab Results  Component Value Date   WBC 11.5 (H) 07/12/2019   RBC 2.94 (L) 07/12/2019   HGB 8.4 (L) 07/12/2019   HCT 27.6 (L) 07/12/2019   MCV 93.9 07/12/2019   MCH 28.6 07/12/2019   PLT 176 07/12/2019   MCHC 30.4 07/12/2019   RDW 19.9 (H) 07/12/2019   LYMPHSABS 0.6 (L) 07/09/2019   MONOABS 1.5 (H) 07/09/2019   EOSABS 0.0 07/09/2019   BASOSABS 0.1 00/93/8182     Last metabolic panel Lab Results  Component Value Date   NA 138 07/12/2019   K 4.0 07/12/2019   CL 102  07/12/2019   CO2 27 07/12/2019   BUN 36 (H) 07/12/2019   CREATININE 1.17 07/12/2019   GLUCOSE 92 07/12/2019   GFRNONAA >60 07/12/2019   GFRAA >60 07/12/2019   CALCIUM 8.1 (L) 07/12/2019   PHOS 2.6 05/02/2019   PROT 5.6 (L) 07/12/2019   ALBUMIN 2.7 (L) 07/12/2019   BILITOT 0.7 07/12/2019   ALKPHOS 112 07/12/2019   AST 15 07/12/2019   ALT 11 07/12/2019   ANIONGAP 9 07/12/2019    CBG (last 3)  Recent Labs    07/12/19 1730 07/12/19 2106 07/13/19 0734  GLUCAP 100* 120* 127*     GFR: Estimated Creatinine Clearance: 73 mL/min (by C-G formula based on SCr of 1.17 mg/dL).  Coagulation Profile: No results for input(s): INR, PROTIME in the last 168 hours.  Recent Results (from the past 240 hour(s))  SARS Coronavirus 2 by RT PCR (hospital order, performed in [facility] hospital lab) Nasopharyngeal Nasopharyngeal Swab     Status: None   Collection Time: 07/09/19 12:44 PM   Specimen: Nasopharyngeal Swab  Result Value Ref Range Status   SARS Coronavirus 2 NEGATIVE NEGATIVE Final    Comment: (NOTE) SARS-CoV-2 target nucleic acids are NOT DETECTED. The SARS-CoV-2 RNA is generally detectable in upper and lower respiratory specimens during the acute phase of infection. The lowest concentration of SARS-CoV-2 viral copies this assay can detect is 250 copies / mL. A negative result does not preclude SARS-CoV-2 infection and should not be used as the sole basis for treatment or other patient management decisions.  A negative result may occur with improper specimen collection /  handling, submission of specimen other than nasopharyngeal swab, presence of viral mutation(s) within the areas targeted by this assay, and inadequate number of viral copies (<250 copies / mL). A negative result must be combined with clinical observations, patient history, and epidemiological information. Fact Sheet for Patients:   StrictlyIdeas.no Fact Sheet for Healthcare  Providers: BankingDealers.co.za This test is not yet approved or cleared  by the [place] FDA and has been authorized for detection and/or diagnosis of SARS-CoV-2 by FDA under an Emergency Use Authorization (EUA).  This EUA will remain in effect (meaning this test can be used) for the duration of the COVID-19 declaration under Section 564(b)(1) of the Act, 21 U.S.C. section 360bbb-3(b)(1), unless the authorization is terminated or revoked sooner. Performed at [facility], [place] [address] [place], [place] [address]   MRSA PCR Screening     Status: None   Collection Time: 07/09/19  7:53 PM   Specimen: Nasal Mucosa; Nasopharyngeal  Result Value Ref Range Status   MRSA by PCR NEGATIVE NEGATIVE Final    Comment:        The GeneXpert MRSA Assay (FDA approved for NASAL specimens only), is one component of a comprehensive MRSA colonization surveillance program. It is not intended to diagnose MRSA infection nor to guide or monitor treatment for MRSA infections. Performed at [facility], [place] [address], [place], [place] [address]         Radiology Studies: DG C-Arm 1-60 Min-No Report  Result Date: 07/12/2019 Fluoroscopy was utilized by the requesting physician.  No radiographic interpretation.        Scheduled Meds: . Chlorhexidine Gluconate Cloth  6 each Topical Q0600  . digoxin  0.125 mg Intravenous Daily  . insulin aspart  0-15 Units Subcutaneous TID WC  . insulin aspart  0-5 Units Subcutaneous QHS  . metoprolol tartrate  5 mg Intravenous Q6H  . pantoprazole (PROTONIX) IV  40 mg Intravenous QHS  . sodium chloride flush  10-40 mL Intracatheter Q12H   Continuous Infusions: . diltiazem (CARDIZEM) infusion 5 mg/hr (07/13/19 0720)     LOS: 4 days     Cordelia Poche, MD [facility] 07/13/2019, 9:31 AM  If 7PM-7AM, please contact night-coverage [email or web address]

## 2019-07-14 LAB — CBC
HCT: 27.1 % — ABNORMAL LOW (ref 39.0–52.0)
Hemoglobin: 8.7 g/dL — ABNORMAL LOW (ref 13.0–17.0)
MCH: 29.3 pg (ref 26.0–34.0)
MCHC: 32.1 g/dL (ref 30.0–36.0)
MCV: 91.2 fL (ref 80.0–100.0)
Platelets: 188 10*3/uL (ref 150–400)
RBC: 2.97 MIL/uL — ABNORMAL LOW (ref 4.22–5.81)
RDW: 19.3 % — ABNORMAL HIGH (ref 11.5–15.5)
WBC: 11.5 10*3/uL — ABNORMAL HIGH (ref 4.0–10.5)
nRBC: 0.3 % — ABNORMAL HIGH (ref 0.0–0.2)

## 2019-07-14 LAB — GLUCOSE, CAPILLARY
Glucose-Capillary: 196 mg/dL — ABNORMAL HIGH (ref 70–99)
Glucose-Capillary: 280 mg/dL — ABNORMAL HIGH (ref 70–99)
Glucose-Capillary: 221 mg/dL — ABNORMAL HIGH (ref 70–99)
Glucose-Capillary: 161 mg/dL — ABNORMAL HIGH (ref 70–99)

## 2019-07-14 LAB — BASIC METABOLIC PANEL
Anion gap: 8 (ref 5–15)
BUN: 17 mg/dL (ref 8–23)
CO2: 26 mmol/L (ref 22–32)
Calcium: 7.8 mg/dL — ABNORMAL LOW (ref 8.9–10.3)
Chloride: 101 mmol/L (ref 98–111)
Creatinine, Ser: 1.04 mg/dL (ref 0.61–1.24)
GFR calc Af Amer: >60 mL/min (ref >60)
GFR calc non Af Amer: >60 mL/min (ref >60)
Glucose, Bld: 221 mg/dL — ABNORMAL HIGH (ref 70–99)
Potassium: 3.5 mmol/L (ref 3.5–5.1)
Sodium: 135 mmol/L (ref 135–145)

## 2019-07-14 LAB — MAGNESIUM: Magnesium: 1.4 mg/dL — ABNORMAL LOW (ref 1.7–2.4)

## 2019-07-14 MED ORDER — POLYETHYLENE GLYCOL 3350 17 G PO PACK
17.0000 g | PACK | Freq: Every day | ORAL | Status: DC
  Administered 2019-07-14: 17 g via ORAL
  Filled 2019-07-14: qty 1

## 2019-07-14 MED ORDER — MAGNESIUM SULFATE 2 GM/50ML IV SOLN
2.0000 g | Freq: Once | INTRAVENOUS | Status: AC
  Administered 2019-07-14: 2 g via INTRAVENOUS
  Filled 2019-07-14: qty 50

## 2019-07-14 MED ORDER — DOCUSATE SODIUM 100 MG PO CAPS
200.0000 mg | ORAL_CAPSULE | Freq: Two times a day (BID) | ORAL | Status: DC
  Administered 2019-07-14: 200 mg via ORAL
  Filled 2019-07-14 (×6): qty 2

## 2019-07-14 NOTE — Progress Notes (Addendum)
PROGRESS NOTE    [name]  POE:[id number] DOB: 1950/04/18 DOA: 07/09/2019 PCP: Patient, No Pcp Per   Brief Narrative: Clarence Andrews. is a 69 y.o. male with a history of CAD s/p CABG, persistent atrial fibrillation/flutter not on anticoagulation secondary to rectal bleeding, chronic systolic heart failure, previous COVID-19 infection, metastatic colon cancer. Patient presented from his oncology office secondary to atrial fibrillation with RVR. While admitted he developed significant nausea and vomiting with CT evidence of obstruction of his sigmoid from is colonic mass.   Assessment & Plan:   Principal Problem:   Atrial fibrillation with RVR ([place]) Active Problems:   Cancer of left colon (HCC)   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HCC)   Type 2 diabetes mellitus without complication (HCC)   CAD (coronary artery disease)   Chronic systolic heart failure (HCC)   Large bowel obstruction (HCC)   Persistent atrial flutter/fibrillation with RVR Patient with a history of atrial fibrillation as an outpatient on metoprolol 100 mg BID and digoxin 0.125 mg daily. Patient's heart rate better controlled on regimen of Cardizem drip titrated to 7.5 mg/hr currently, digoxin 0.125 mg IV daily, metoprolol 5 mg IV q6 hours. Heart rates have been worsening with rates in the 120s. -Cardiology recommendations: Cardizem IV, digoxin IV, metoprolol IV; no changes  Large bowel obstruction Patient with no bowel movement in 5 days prior to admission. No prior history of obstruction but has had abdominal surgery in the past. History of adenocarcinoma of the colon as mentioned below. Abdominal x-ray (6/8) significant for possible ileus. CT abdomen/pelvis (6/9) obtained and significant for severe/obstructing malignant stricture at sigmoid colon. General surgery and GI consulted on 6/9. Patient feels better after NG tube placement. Having bowel movements after colonic stent placed -GI recommendations:  advance diet as able; stent placed 6/11  Adenocarcinoma of the colon Metastatic adenocarcinoma to liver Recently diagnosed. Currently follows with medical oncology, Dr. [name] and is on active chemotherapy treatment.  Chronic systolic heart failure EF of 40-45%. Patient is euvolemic on admission. On metoprolol (Lopressor) as an outpatient. Patient is not on diuretic therapy. Rales noted on exam but chest x-ray without evidence of effusion or edema.  -Watch fluid status; daily weights and strict in/out  History of CABG CAD Patient was previously on aspirin which was discontinued secondary to rectal bleeding.  Diabetes mellitus, type 2 Patient is on glipizide as an outpatient. Last hemoglobin A1C of 7.8%.  -Continue SSI  AKI on  CKD stage II Baseline creatinine of 1.2. Peak creatinine of 1.5 which is now back to baseline with IV fluids -Watch BMP daily   DVT prophylaxis: SCDs Code Status:   Code Status: Full Code Family Communication: None at bedside Disposition Plan: Discharge date likely in 2-3 days pending specialist sign off/recommendations   Consultants:   Cardiology (6/8)  General surgery (6/9)  Gastroenterology (6/9)  Procedures:   FLEXIBLE SIGMOIDOSCOPY; COLONIC STENT PLACEMENT (07/12/2019) Impression:               - Preparation of the colon was fair but inadequate                            for appropriate screening purposes but not for                            diagnostic/therapeutic intention..                           -  Hemorrhoids found on digital rectal exam.                           - Malignant completely obstructing tumor from 17 to                            20 cm proximal to the anus.                           - Dilated in the sigmoid colon proximal to the                            obstructing colonic mass.                           - Wire placed beyond the stenosis, area injected                            with radiographic contrast to delineate  stenosis                            further. Prosthesis placed.  Recommendation:           - The patient will be observed post-procedure,                            until all discharge criteria are met.                           - Patient to the PACU for recovery.                           - Return patient to hospital ward for ongoing care                            thereafter.                           - Observe clinical status.                           - May trial sips of clear liquids the rest of today                            but maintain NGT in place but can try to cap for                            now.                           - If patient tolerates the sips and KUB tomorrow                            (already ordered) looks to have enteral stent still  in good position, then would allow clear liquids to                            advance to full liquids throughout Saturday. On                            Sunday, if patient is doing well, then may                            transition to soft/pureed foods for next 48 hours.                            Then may transition to a low-residue diet moving                            forward. Stop all fiber supplementation.                           - Recommend that patient be initiated on Colace 200                            mg 1-2 times daily on Saturday. Would also                            recommend Miralax 1-2 capfuls daily in effort of                            trying to optimize stool consistency and not allow                            stent to become clogged in the coming days.                           - Stent should fully expand over the next 2-weeks.                           - The findings and recommendations were discussed                            with the patient.                           - The findings and recommendations were discussed                            with the referring  physician.                           - The findings and recommendations were discussed                            with the Hospital Team.  Antimicrobials:  None    Subjective: Having bowel movements. No issues.  Objective: Vitals:  07/14/19 0400 07/14/19 0500 07/14/19 0700 07/14/19 0800  BP: (!) 146/59 137/83 131/60 (!) 150/106  Pulse: (!) 101 (!) 113 (!) 107 (!) 131  Resp: (!) 25 (!) 25 15 (!) 22  Temp: 99.9 F (37.7 C)  98.8 F (37.1 C)   TempSrc: Axillary  Oral   SpO2: 95% 95% 98% 93%  Weight:  99.5 kg    Height:        Intake/Output Summary (Last 24 hours) at 07/14/2019 1118 Last data filed at 07/14/2019 0800 Gross per 24 hour  Intake 1683.45 ml  Output 950 ml  Net 733.45 ml   Filed Weights   07/11/19 0500 07/13/19 0500 07/14/19 0500  Weight: 99.5 kg 99.5 kg 99.5 kg    Examination:  General exam: Appears calm and comfortable Respiratory system: Clear to auscultation. Respiratory effort normal. Cardiovascular system: S1 & S2 heard, Irregular rhythm, normal rate. Gastrointestinal system: Abdomen is distended, soft and nontender. No organomegaly or masses felt. Normal bowel sounds heard. Central nervous system: Alert and oriented. No focal neurological deficits. Musculoskeletal: No edema. No calf tenderness Skin: No cyanosis. Psychiatry: Judgement and insight appear normal. Mood & affect appropriate.     Data Reviewed: I have personally reviewed following labs and imaging studies  CBC Lab Results  Component Value Date   WBC 11.5 (H) 07/14/2019   RBC 2.97 (L) 07/14/2019   HGB 8.7 (L) 07/14/2019   HCT 27.1 (L) 07/14/2019   MCV 91.2 07/14/2019   MCH 29.3 07/14/2019   PLT 188 07/14/2019   MCHC 32.1 07/14/2019   RDW 19.3 (H) 07/14/2019   LYMPHSABS 0.6 (L) 07/09/2019   MONOABS 1.5 (H) 07/09/2019   EOSABS 0.0 07/09/2019   BASOSABS 0.1 95/28/4132     Last metabolic panel Lab Results  Component Value Date   NA 135 07/14/2019   K 3.5 07/14/2019   CL  101 07/14/2019   CO2 26 07/14/2019   BUN 17 07/14/2019   CREATININE 1.04 07/14/2019   GLUCOSE 221 (H) 07/14/2019   GFRNONAA >60 07/14/2019   GFRAA >60 07/14/2019   CALCIUM 7.8 (L) 07/14/2019   PHOS 2.6 05/02/2019   PROT 5.6 (L) 07/12/2019   ALBUMIN 2.7 (L) 07/12/2019   BILITOT 0.7 07/12/2019   ALKPHOS 112 07/12/2019   AST 15 07/12/2019   ALT 11 07/12/2019   ANIONGAP 8 07/14/2019    CBG (last 3)  Recent Labs    07/13/19 1658 07/13/19 2124 07/14/19 0751  GLUCAP 181* 188* 196*     GFR: Estimated Creatinine Clearance: 82.1 mL/min (by C-G formula based on SCr of 1.04 mg/dL).  Coagulation Profile: No results for input(s): INR, PROTIME in the last 168 hours.  Recent Results (from the past 240 hour(s))  SARS Coronavirus 2 by RT PCR (hospital order, performed in [facility] hospital lab) Nasopharyngeal Nasopharyngeal Swab     Status: None   Collection Time: 07/09/19 12:44 PM   Specimen: Nasopharyngeal Swab  Result Value Ref Range Status   SARS Coronavirus 2 NEGATIVE NEGATIVE Final    Comment: (NOTE) SARS-CoV-2 target nucleic acids are NOT DETECTED. The SARS-CoV-2 RNA is generally detectable in upper and lower respiratory specimens during the acute phase of infection. The lowest concentration of SARS-CoV-2 viral copies this assay can detect is 250 copies / mL. A negative result does not preclude SARS-CoV-2 infection and should not be used as the sole basis for treatment or other patient management decisions.  A negative result may occur with improper specimen collection / handling, submission  of specimen other than nasopharyngeal swab, presence of viral mutation(s) within the areas targeted by this assay, and inadequate number of viral copies (<250 copies / mL). A negative result must be combined with clinical observations, patient history, and epidemiological information. Fact Sheet for Patients:   StrictlyIdeas.no Fact Sheet for Healthcare  Providers: BankingDealers.co.za This test is not yet approved or cleared  by the [place] FDA and has been authorized for detection and/or diagnosis of SARS-CoV-2 by FDA under an Emergency Use Authorization (EUA).  This EUA will remain in effect (meaning this test can be used) for the duration of the COVID-19 declaration under Section 564(b)(1) of the Act, 21 U.S.C. section 360bbb-3(b)(1), unless the authorization is terminated or revoked sooner. Performed at [facility], [place] [address], [place], [place] [address]   MRSA PCR Screening     Status: None   Collection Time: 07/09/19  7:53 PM   Specimen: Nasal Mucosa; Nasopharyngeal  Result Value Ref Range Status   MRSA by PCR NEGATIVE NEGATIVE Final    Comment:        The GeneXpert MRSA Assay (FDA approved for NASAL specimens only), is one component of a comprehensive MRSA colonization surveillance program. It is not intended to diagnose MRSA infection nor to guide or monitor treatment for MRSA infections. Performed at [facility], [place] [address] [place], [place] [address]         Radiology Studies: DG Abd 2 Views  Result Date: 07/13/2019 CLINICAL DATA:  Status post colonic stent placement EXAM: ABDOMEN - 2 VIEW COMPARISON:  July 10, 2019 FINDINGS: There is an apparent stent in the sigmoid colon region overlying the sacrum. There is no appreciable bowel dilatation or air-fluid level to suggest bowel obstruction. No free air. Nasogastric tube tip and side port in stomach. There are surgical clips in the upper abdomen. Lung bases are clear. IMPRESSION: Apparent stent in the sigmoid colon region overlying the sacrum. Nasogastric tube tip and side port in stomach. No bowel obstruction or free air. Lung bases clear. Electronically Signed   By: Lowella Grip III M.D.   On: 07/13/2019 11:44   DG C-Arm 1-60 Min-No Report  Result Date: 07/12/2019 Fluoroscopy was  utilized by the requesting physician.  No radiographic interpretation.        Scheduled Meds: . Chlorhexidine Gluconate Cloth  6 each Topical Q0600  . digoxin  0.125 mg Intravenous Daily  . docusate sodium  200 mg Oral BID  . insulin aspart  0-15 Units Subcutaneous TID WC  . insulin aspart  0-5 Units Subcutaneous QHS  . metoprolol tartrate  5 mg Intravenous Q6H  . pantoprazole (PROTONIX) IV  40 mg Intravenous QHS  . polyethylene glycol  17 g Oral Daily  . sodium chloride flush  10-40 mL Intracatheter Q12H   Continuous Infusions: . diltiazem (CARDIZEM) infusion 7.5 mg/hr (07/14/19 0800)     LOS: 5 days     Cordelia Poche, MD [facility] 07/14/2019, 11:18 AM  If 7PM-7AM, please contact night-coverage [email or web address]

## 2019-07-14 NOTE — Progress Notes (Signed)
Progress Note  Patient Name: Clarence Andrews. Date of Encounter: 07/14/2019  Primary Cardiologist: Mertie Moores, MD   Subjective   No cardiac complaints NG tube out only taking soft food   Inpatient Medications    Scheduled Meds: . Chlorhexidine Gluconate Cloth  6 each Topical Q0600  . digoxin  0.125 mg Intravenous Daily  . insulin aspart  0-15 Units Subcutaneous TID WC  . insulin aspart  0-5 Units Subcutaneous QHS  . metoprolol tartrate  5 mg Intravenous Q6H  . pantoprazole (PROTONIX) IV  40 mg Intravenous QHS  . sodium chloride flush  10-40 mL Intracatheter Q12H   Continuous Infusions: . diltiazem (CARDIZEM) infusion 7.5 mg/hr (07/14/19 0400)  . magnesium sulfate bolus IVPB     PRN Meds: alum & mag hydroxide-simeth, fentaNYL (SUBLIMAZE) injection, levalbuterol, liver oil-zinc oxide, ondansetron **OR** ondansetron (ZOFRAN) IV, phenol, polyethylene glycol, sodium chloride flush   Vital Signs    Vitals:   07/14/19 0300 07/14/19 0400 07/14/19 0500 07/14/19 0700  BP: 136/83 (!) 146/59 137/83 131/60  Pulse: 98 (!) 101 (!) 113 (!) 107  Resp: 12 (!) 25 (!) 25 15  Temp:  99.9 F (37.7 C)  98.8 F (37.1 C)  TempSrc:  Axillary  Oral  SpO2: 99% 95% 95% 98%  Weight:   99.5 kg   Height:        Intake/Output Summary (Last 24 hours) at 07/14/2019 5400 Last data filed at 07/14/2019 8676 Gross per 24 hour  Intake 1670.69 ml  Output 950 ml  Net 720.69 ml   Filed Weights   07/11/19 0500 07/13/19 0500 07/14/19 0500  Weight: 99.5 kg 99.5 kg 99.5 kg    Telemetry    Aflutter with variable block in the 70-80's- Personally Reviewed  ECG    NO new EKG to review - Personally Reviewed  Physical Exam  Affect appropriate Chronically ill pale male  HEENT: normal Neck supple with no adenopathy Port a cath right IJ/subclavian  Lungs clear with no wheezing and good diaphragmatic motion Heart:  S1/S2 no murmur, no rub, gallop or click PMI normal Abdomen: benighn, BS  positve, no tenderness, no AAA no bruit.  No HSM or HJR Distal pulses intact with no bruits No edema Neuro non-focal Skin warm and dry No muscular weakness    Labs    Chemistry Recent Labs  Lab 07/10/19 0346 07/10/19 0346 07/11/19 0445 07/12/19 0504 07/14/19 0527  NA 137   < > 139 138 135  K 4.2   < > 3.9 4.0 3.5  CL 100   < > 100 102 101  CO2 27   < > 28 27 26   GLUCOSE 181*   < > 125* 92 221*  BUN 37*   < > 44* 36* 17  CREATININE 1.18   < > 1.50* 1.17 1.04  CALCIUM 8.6*   < > 8.4* 8.1* 7.8*  PROT 6.5  --  5.7* 5.6*  --   ALBUMIN 3.2*  --  2.8* 2.7*  --   AST 18  --  16 15  --   ALT 14  --  11 11  --   ALKPHOS 146*  --  124 112  --   BILITOT 0.9  --  0.6 0.7  --   GFRNONAA >60   < > 46* >60 >60  GFRAA >60   < > 54* >60 >60  ANIONGAP 10   < > 11 9 8    < > = values in this interval  not displayed.     Hematology Recent Labs  Lab 07/12/19 0504 07/13/19 0950 07/14/19 0527  WBC 11.5* 12.8* 11.5*  RBC 2.94* 3.40* 2.97*  HGB 8.4* 9.8* 8.7*  HCT 27.6* 31.5* 27.1*  MCV 93.9 92.6 91.2  MCH 28.6 28.8 29.3  MCHC 30.4 31.1 32.1  RDW 19.9* 19.5* 19.3*  PLT 176 224 188    Cardiac EnzymesNo results for input(s): TROPONINI in the last 168 hours. No results for input(s): TROPIPOC in the last 168 hours.   BNPNo results for input(s): BNP, PROBNP in the last 168 hours.   DDimer No results for input(s): DDIMER in the last 168 hours.   Radiology    DG Abd 2 Views  Result Date: 07/13/2019 CLINICAL DATA:  Status post colonic stent placement EXAM: ABDOMEN - 2 VIEW COMPARISON:  July 10, 2019 FINDINGS: There is an apparent stent in the sigmoid colon region overlying the sacrum. There is no appreciable bowel dilatation or air-fluid level to suggest bowel obstruction. No free air. Nasogastric tube tip and side port in stomach. There are surgical clips in the upper abdomen. Lung bases are clear. IMPRESSION: Apparent stent in the sigmoid colon region overlying the sacrum. Nasogastric  tube tip and side port in stomach. No bowel obstruction or free air. Lung bases clear. Electronically Signed   By: Lowella Grip III M.D.   On: 07/13/2019 11:44   DG C-Arm 1-60 Min-No Report  Result Date: 07/12/2019 Fluoroscopy was utilized by the requesting physician.  No radiographic interpretation.    Cardiac Studies   2D echo 04/28/2019 IMPRESSIONS   1. Difficult to assess LV systolic function given tachycardia and poor  windows, but appears mildly to moderately reduced. Left ventricular  ejection fraction, by estimation, is 40 to 45%. Would consider repeat TTE  with contrast once heart rate better  controlled. There is moderate left ventricular hypertrophy. Left  ventricular diastolic parameters are indeterminate.  2. Right ventricle is poorly visualized, but function appears moderately  reduced  3. The mitral valve is normal in structure. No evidence of mitral valve  regurgitation.  4. The aortic valve is tricuspid. Aortic valve regurgitation is not  visualized. No aortic stenosis is present.  5. Aortic dilatation noted. 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  6. The inferior vena cava is dilated in size with <50% respiratory  variability, suggesting right atrial pressure of 15 mmHg.   Patient Profile     69 y.o. male with a hx of ASCAD s/p CABG in 2017, PAF, chronic systolic CHF, DM, Thoracic aortic aneurysm, throat/tongue CA and metastatic colorectal CA who is being seen today for the evaluation of atrial fibrillation with RVR at the request of Dr. [name].   Assessment & Plan    1. Persistent atrial flutter/fibrillation now with RVR -rate control good no anticoagulation with chronic anemia and metastatic colon cancer recent obstruction  -continue Cardizem gtt at 5mg /hr, Lopressor 5mg  IV q6 hours and IV dig 0.125mg  daily. If he has a good Day can change rate controlling meds to PO in am   2.  ASCAD -s/p remote CABG -denies any  anginal sx -PO BB changed to IV due to N/V and bowel obstruction -currently not on statin therapy -no ASA due to bleeding issues with colon  3.  Chronic systolic CHF -EF 95-09% on echo 04/2019 -PRN diuresis - about a liter yesterday stable   4.  Malignant neoplasm of sigmoid colon -per oncology -S/P 4 cycles of FOLFOX -  colonic obstruction post stent NG tube out  - port a cath access      For questions or updates, please contact [place] Please consult [email or web address] for contact info under Cardiology/STEMI.      Signed, Jenkins Rouge, MD  07/14/2019, 8:14 AM

## 2019-07-15 ENCOUNTER — Telehealth: Payer: Self-pay | Admitting: Oncology

## 2019-07-15 ENCOUNTER — Encounter (HOSPITAL_COMMUNITY): Payer: Self-pay | Admitting: Gastroenterology

## 2019-07-15 LAB — BASIC METABOLIC PANEL
Anion gap: 8 (ref 5–15)
BUN: 19 mg/dL (ref 8–23)
CO2: 25 mmol/L (ref 22–32)
Calcium: 7.7 mg/dL — ABNORMAL LOW (ref 8.9–10.3)
Chloride: 100 mmol/L (ref 98–111)
Creatinine, Ser: 1.15 mg/dL (ref 0.61–1.24)
GFR calc Af Amer: >60 mL/min (ref >60)
GFR calc non Af Amer: >60 mL/min (ref >60)
Glucose, Bld: 245 mg/dL — ABNORMAL HIGH (ref 70–99)
Potassium: 3.8 mmol/L (ref 3.5–5.1)
Sodium: 133 mmol/L — ABNORMAL LOW (ref 135–145)

## 2019-07-15 LAB — GLUCOSE, CAPILLARY
Glucose-Capillary: 210 mg/dL — ABNORMAL HIGH (ref 70–99)
Glucose-Capillary: 229 mg/dL — ABNORMAL HIGH (ref 70–99)
Glucose-Capillary: 223 mg/dL — ABNORMAL HIGH (ref 70–99)
Glucose-Capillary: 218 mg/dL — ABNORMAL HIGH (ref 70–99)

## 2019-07-15 LAB — MAGNESIUM: Magnesium: 1.5 mg/dL — ABNORMAL LOW (ref 1.7–2.4)

## 2019-07-15 MED ORDER — METOPROLOL TARTRATE 25 MG PO TABS
25.0000 mg | ORAL_TABLET | Freq: Two times a day (BID) | ORAL | Status: DC
  Administered 2019-07-15 – 2019-07-17 (×5): 25 mg via ORAL
  Filled 2019-07-15 (×6): qty 1

## 2019-07-15 NOTE — Progress Notes (Signed)
Inpatient Diabetes Program Recommendations  AACE/ADA: New Consensus Statement on Inpatient Glycemic Control (2015)  Target Ranges:  Prepandial:   less than 140 mg/dL      Peak postprandial:   less than 180 mg/dL (1-2 hours)      Critically ill patients:  140 - 180 mg/dL   Lab Results  Component Value Date   GLUCAP 229 (H) 07/15/2019   HGBA1C 7.8 (H) 07/09/2019    Review of Glycemic Control  Diabetes history: DM2 Outpatient Diabetes medications: glipizide 10 mg QAM Current orders for Inpatient glycemic control: Novolog 0-15 units tidwc and hs  HgbA1C - 7.8% Blood sugars > goal of 180 or less.  FBS 210  Inpatient Diabetes Program Recommendations:     May benefit from small amount of Lantus - 6 units QHS Add Novolog 2 units tidwc for meal coverage insulin if pt eats > 50% meal. Add CHO mod to low residue soft diet.  Follow daily.  Thank you. Lorenda Peck, RD, LDN, CDE Inpatient Diabetes Coordinator [phone]

## 2019-07-15 NOTE — Telephone Encounter (Signed)
Called pt per 6/14 sch message - unable to reach pt . Left message with appt date and time

## 2019-07-15 NOTE — Progress Notes (Signed)
PROGRESS NOTE    Clarence Andrews.  TML:[id number] DOB: 11/24/1950 DOA: 07/09/2019 PCP: Patient, No Pcp Per   Brief Narrative: Clarence Andrews. is a 69 y.o. male with a history of CAD s/p CABG, persistent atrial fibrillation/flutter not on anticoagulation secondary to rectal bleeding, chronic systolic heart failure, previous COVID-19 infection, metastatic colon cancer. Patient presented from his oncology office secondary to atrial fibrillation with RVR. While admitted he developed significant nausea and vomiting with CT evidence of obstruction of his sigmoid from is colonic mass.   Assessment & Plan:   Principal Problem:   Atrial fibrillation with RVR ([place]) Active Problems:   Cancer of left colon (HCC)   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HCC)   Type 2 diabetes mellitus without complication (HCC)   CAD (coronary artery disease)   Chronic systolic heart failure (HCC)   Large bowel obstruction (HCC)   Persistent atrial flutter/fibrillation with RVR Patient with a history of atrial fibrillation as an outpatient on metoprolol 100 mg BID and digoxin 0.125 mg daily. Patient's heart rate better controlled on regimen of Cardizem drip titrated to 10 mg/hr currently but has needed as much as 15 mg/hr in last 24 hours, digoxin 0.125 mg IV daily, metoprolol 5 mg IV q6 hours. Heart rates have been worsening with rates in the 120s. -Cardiology recommendations: Cardizem IV, digoxin IV, metoprolol IV; pending today  Large bowel obstruction Patient with no bowel movement in 5 days prior to admission. No prior history of obstruction but has had abdominal surgery in the past. History of adenocarcinoma of the colon as mentioned below. Abdominal x-ray (6/8) significant for possible ileus. CT abdomen/pelvis (6/9) obtained and significant for severe/obstructing malignant stricture at sigmoid colon. General surgery and GI consulted on 6/9. Patient feels better after NG tube placement. Having bowel movements  after colonic stent placed -GI recommendations: advance diet as able; stent placed 6/11 -General surgery recommendations pending. Will need recommendations for outpatient follow-up  Adenocarcinoma of the colon Metastatic adenocarcinoma to liver Recently diagnosed. Currently follows with medical oncology, Dr. [name] and is on active chemotherapy treatment.  Chronic systolic heart failure EF of 40-45%. Patient is euvolemic on admission. On metoprolol (Lopressor) as an outpatient. Patient is not on diuretic therapy. Rales noted on exam but chest x-ray without evidence of effusion or edema.  -Watch fluid status; daily weights and strict in/out  History of CABG CAD Patient was previously on aspirin which was discontinued secondary to rectal bleeding.  Diabetes mellitus, type 2 Patient is on glipizide as an outpatient. Last hemoglobin A1C of 7.8%.  -Continue SSI  AKI on  CKD stage II Baseline creatinine of 1.2. Peak creatinine of 1.5 which is now back to baseline with IV fluids. Resolved.   DVT prophylaxis: SCDs Code Status:   Code Status: Full Code Family Communication: None at bedside Disposition Plan: Transfer to telemetry once off of diltiazem drip per cardiology and anticipate discharge home tomorrow pending final specialist and PT/OT recommendations   Consultants:   Cardiology (6/8)  General surgery (6/9)  Gastroenterology (6/9)  Procedures:   FLEXIBLE SIGMOIDOSCOPY; COLONIC STENT PLACEMENT (07/12/2019) Impression:               - Preparation of the colon was fair but inadequate                            for appropriate screening purposes but not for  diagnostic/therapeutic intention..                           - Hemorrhoids found on digital rectal exam.                           - Malignant completely obstructing tumor from 17 to                            20 cm proximal to the anus.                           - Dilated in the sigmoid colon  proximal to the                            obstructing colonic mass.                           - Wire placed beyond the stenosis, area injected                            with radiographic contrast to delineate stenosis                            further. Prosthesis placed.  Recommendation:           - The patient will be observed post-procedure,                            until all discharge criteria are met.                           - Patient to the PACU for recovery.                           - Return patient to hospital ward for ongoing care                            thereafter.                           - Observe clinical status.                           - May trial sips of clear liquids the rest of today                            but maintain NGT in place but can try to cap for                            now.                           - If patient tolerates the sips and KUB tomorrow                            (  already ordered) looks to have enteral stent still                            in good position, then would allow clear liquids to                            advance to full liquids throughout Saturday. On                            Sunday, if patient is doing well, then may                            transition to soft/pureed foods for next 48 hours.                            Then may transition to a low-residue diet moving                            forward. Stop all fiber supplementation.                           - Recommend that patient be initiated on Colace 200                            mg 1-2 times daily on Saturday. Would also                            recommend Miralax 1-2 capfuls daily in effort of                            trying to optimize stool consistency and not allow                            stent to become clogged in the coming days.                           - Stent should fully expand over the next 2-weeks.                           - The  findings and recommendations were discussed                            with the patient.                           - The findings and recommendations were discussed                            with the referring physician.                           - The findings and recommendations were discussed  with the Hospital Team.  Antimicrobials:  None    Subjective: No issues overnight. Having bowel movements.  Objective: Vitals:   07/15/19 0400 07/15/19 0425 07/15/19 0500 07/15/19 0600  BP: 119/61  108/71 (!) 116/55  Pulse: (!) 127 (!) 118 (!) 117 (!) 111  Resp: (!) 31 (!) 25 (!) 25 16  Temp: 98.2 F (36.8 C)     TempSrc: Oral     SpO2: 94% 96% 96% 98%  Weight:   99.3 kg   Height:        Intake/Output Summary (Last 24 hours) at 07/15/2019 0817 Last data filed at 07/15/2019 0622 Gross per 24 hour  Intake 437.5 ml  Output 650 ml  Net -212.5 ml   Filed Weights   07/13/19 0500 07/14/19 0500 07/15/19 0500  Weight: 99.5 kg 99.5 kg 99.3 kg    Examination:  General exam: Appears calm and comfortable Respiratory system: Clear to auscultation. Respiratory effort normal. Cardiovascular system: S1 & S2 heard, Irregular rhythm, normal rate. No murmurs, rubs, gallops or clicks. Gastrointestinal system: Abdomen is nondistended, soft and nontender. No organomegaly or masses felt. Normal bowel sounds heard. Central nervous system: Alert and oriented. No focal neurological deficits. Musculoskeletal: No edema. No calf tenderness Skin: No cyanosis. No rashes Psychiatry: Judgement and insight appear normal. Mood & affect appropriate.     Data Reviewed: I have personally reviewed following labs and imaging studies  CBC Lab Results  Component Value Date   WBC 11.5 (H) 07/14/2019   RBC 2.97 (L) 07/14/2019   HGB 8.7 (L) 07/14/2019   HCT 27.1 (L) 07/14/2019   MCV 91.2 07/14/2019   MCH 29.3 07/14/2019   PLT 188 07/14/2019   MCHC 32.1 07/14/2019   RDW 19.3 (H)  07/14/2019   LYMPHSABS 0.6 (L) 07/09/2019   MONOABS 1.5 (H) 07/09/2019   EOSABS 0.0 07/09/2019   BASOSABS 0.1 19/62/2297     Last metabolic panel Lab Results  Component Value Date   NA 135 07/14/2019   K 3.5 07/14/2019   CL 101 07/14/2019   CO2 26 07/14/2019   BUN 17 07/14/2019   CREATININE 1.04 07/14/2019   GLUCOSE 221 (H) 07/14/2019   GFRNONAA >60 07/14/2019   GFRAA >60 07/14/2019   CALCIUM 7.8 (L) 07/14/2019   PHOS 2.6 05/02/2019   PROT 5.6 (L) 07/12/2019   ALBUMIN 2.7 (L) 07/12/2019   BILITOT 0.7 07/12/2019   ALKPHOS 112 07/12/2019   AST 15 07/12/2019   ALT 11 07/12/2019   ANIONGAP 8 07/14/2019    CBG (last 3)  Recent Labs    07/14/19 1611 07/14/19 2125 07/15/19 0731  GLUCAP 221* 161* 210*     GFR: Estimated Creatinine Clearance: 82 mL/min (by C-G formula based on SCr of 1.04 mg/dL).  Coagulation Profile: No results for input(s): INR, PROTIME in the last 168 hours.  Recent Results (from the past 240 hour(s))  SARS Coronavirus 2 by RT PCR (hospital order, performed in [facility] hospital lab) Nasopharyngeal Nasopharyngeal Swab     Status: None   Collection Time: 07/09/19 12:44 PM   Specimen: Nasopharyngeal Swab  Result Value Ref Range Status   SARS Coronavirus 2 NEGATIVE NEGATIVE Final    Comment: (NOTE) SARS-CoV-2 target nucleic acids are NOT DETECTED. The SARS-CoV-2 RNA is generally detectable in upper and lower respiratory specimens during the acute phase of infection. The lowest concentration of SARS-CoV-2 viral copies this assay can detect is 250 copies / mL. A negative result does not preclude SARS-CoV-2 infection and should  not be used as the sole basis for treatment or other patient management decisions.  A negative result may occur with improper specimen collection / handling, submission of specimen other than nasopharyngeal swab, presence of viral mutation(s) within the areas targeted by this assay, and inadequate number of viral  copies (<250 copies / mL). A negative result must be combined with clinical observations, patient history, and epidemiological information. Fact Sheet for Patients:   StrictlyIdeas.no Fact Sheet for Healthcare Providers: BankingDealers.co.za This test is not yet approved or cleared  by the [place] FDA and has been authorized for detection and/or diagnosis of SARS-CoV-2 by FDA under an Emergency Use Authorization (EUA).  This EUA will remain in effect (meaning this test can be used) for the duration of the COVID-19 declaration under Section 564(b)(1) of the Act, 21 U.S.C. section 360bbb-3(b)(1), unless the authorization is terminated or revoked sooner. Performed at [facility], [place] [address], [place], [place] [address]   MRSA PCR Screening     Status: None   Collection Time: 07/09/19  7:53 PM   Specimen: Nasal Mucosa; Nasopharyngeal  Result Value Ref Range Status   MRSA by PCR NEGATIVE NEGATIVE Final    Comment:        The GeneXpert MRSA Assay (FDA approved for NASAL specimens only), is one component of a comprehensive MRSA colonization surveillance program. It is not intended to diagnose MRSA infection nor to guide or monitor treatment for MRSA infections. Performed at [facility], [place] [address], [place], [place] [address]         Radiology Studies: DG Abd 2 Views  Result Date: 07/13/2019 CLINICAL DATA:  Status post colonic stent placement EXAM: ABDOMEN - 2 VIEW COMPARISON:  July 10, 2019 FINDINGS: There is an apparent stent in the sigmoid colon region overlying the sacrum. There is no appreciable bowel dilatation or air-fluid level to suggest bowel obstruction. No free air. Nasogastric tube tip and side port in stomach. There are surgical clips in the upper abdomen. Lung bases are clear. IMPRESSION: Apparent stent in the sigmoid colon region overlying the sacrum. Nasogastric tube  tip and side port in stomach. No bowel obstruction or free air. Lung bases clear. Electronically Signed   By: Lowella Grip III M.D.   On: 07/13/2019 11:44        Scheduled Meds: . Chlorhexidine Gluconate Cloth  6 each Topical Q0600  . digoxin  0.125 mg Intravenous Daily  . docusate sodium  200 mg Oral BID  . insulin aspart  0-15 Units Subcutaneous TID WC  . insulin aspart  0-5 Units Subcutaneous QHS  . metoprolol tartrate  5 mg Intravenous Q6H  . pantoprazole (PROTONIX) IV  40 mg Intravenous QHS  . polyethylene glycol  17 g Oral Daily  . sodium chloride flush  10-40 mL Intracatheter Q12H   Continuous Infusions: . diltiazem (CARDIZEM) infusion 10 mg/hr (07/15/19 0605)     LOS: 6 days     Cordelia Poche, MD [facility] 07/15/2019, 8:17 AM  If 7PM-7AM, please contact night-coverage [email or web address]

## 2019-07-15 NOTE — Evaluation (Signed)
Physical Therapy Evaluation Patient Details Name: Clarence Andrews. MRN: [id number] DOB: 14-Dec-1950 Today's Date: 07/15/2019   History of Present Illness  69 y.o. male with a history of CAD s/p CABG, persistent atrial fibrillation/flutter not on anticoagulation secondary to rectal bleeding, chronic systolic heart failure, previous COVID-19 infection, metastatic colon cancer. Patient presented from his oncology office secondary to atrial fibrillation with RVR. While admitted he developed significant nausea and vomiting with CT evidence of obstruction of his sigmoid from is colonic mass  Clinical Impression  Pt admitted with above diagnosis.  Pt currently with functional limitations due to the deficits listed below (see PT Problem List). Pt will benefit from skilled PT to increase their independence and safety with mobility to allow discharge to the venue listed below.  Pt with soiled linen on arrival so assisted with pericare.  Pt able to get to EOB with min assist however unable to stand with RW today.  Pt attempting to scoot over to recliner however very unsafe and encouraged use of stedy instead.  Pt able to stand with stedy with mod assist.  Even after being unable to stand with significant assist and steady as well as inability to ambulate today, pt thinks he will do okay at home.  Recommend SNF upon d/c.     Follow Up Recommendations SNF    Equipment Recommendations  Wheelchair cushion (measurements PT);Wheelchair (measurements PT);Hospital bed    Recommendations for Other Services       Precautions / Restrictions Precautions Precautions: Fall Precaution Comments: monitor HR   HR 110 - 138- 140 (just after transferring) bpm, BP 121/66 mmHg, Spo2 91% room air   Mobility  Bed Mobility Overal bed mobility: Needs Assistance Bed Mobility: Rolling;Supine to Sit Rolling: Min assist   Supine to sit: Min assist;HOB elevated     General bed mobility comments: rolling to perform pericare  (BM in bed); assist to sit upright for trunk  Transfers Overall transfer level: Needs assistance   Transfers: Stand Pivot Transfers;Sit to/from Stand Sit to Stand: Total assist Stand pivot transfers: Mod assist       General transfer comment: pt unable to perform sit to stand with RW, however utilized stedy and pt able to better assist  Ambulation/Gait             General Gait Details: pt unable  Stairs            Wheelchair Mobility    Modified Rankin (Stroke Patients Only)       Balance Overall balance assessment: Needs assistance Sitting-balance support: Bilateral upper extremity supported;Feet supported Sitting balance-Leahy Scale: Poor Sitting balance - Comments: UE support required       Standing balance comment: unable to stand                             Pertinent Vitals/Pain Pain Assessment: Faces Faces Pain Scale: Hurts a little bit Pain Location: right knee Pain Descriptors / Indicators: Sore Pain Intervention(s): Repositioned;Monitored during session    Home Living Family/patient expects to be discharged to:: Private residence Living Arrangements: Alone Available Help at Discharge: Friend(s);Available PRN/intermittently Type of Home: Apartment Home Access: Level entry     Home Layout: One level Home Equipment: Walker - 2 wheels      Prior Function Level of Independence: Independent               Hand Dominance        Extremity/Trunk Assessment  Upper Extremity Assessment Upper Extremity Assessment: Generalized weakness    Lower Extremity Assessment Lower Extremity Assessment: Generalized weakness    Cervical / Trunk Assessment Cervical / Trunk Assessment: Normal  Communication   Communication: No difficulties  Cognition Arousal/Alertness: Awake/alert Behavior During Therapy: WFL for tasks assessed/performed Overall Cognitive Status: Within Functional Limits for tasks assessed                                         General Comments      Exercises     Assessment/Plan    PT Assessment Patient needs continued PT services  PT Problem List Decreased balance;Decreased knowledge of use of DME;Decreased activity tolerance;Decreased strength;Decreased mobility       PT Treatment Interventions DME instruction;Therapeutic activities;Gait training;Therapeutic exercise;Patient/family education;Functional mobility training;Balance training;Wheelchair mobility training    PT Goals (Current goals can be found in the Care Plan section)  Acute Rehab PT Goals Patient Stated Goal: states he plans to go home PT Goal Formulation: With patient Time For Goal Achievement: 07/29/19 Potential to Achieve Goals: Fair    Frequency Min 2X/week   Barriers to discharge        Co-evaluation               AM-PAC PT "6 Clicks" Mobility  Outcome Measure Help needed turning from your back to your side while in a flat bed without using bedrails?: A Little Help needed moving from lying on your back to sitting on the side of a flat bed without using bedrails?: A Little Help needed moving to and from a bed to a chair (including a wheelchair)?: Total Help needed standing up from a chair using your arms (e.g., wheelchair or bedside chair)?: Total Help needed to walk in hospital room?: Total Help needed climbing 3-5 steps with a railing? : Total 6 Click Score: 10    End of Session Equipment Utilized During Treatment: Gait belt Activity Tolerance: Patient tolerated treatment well Patient left: in chair;with call bell/phone within reach;with chair alarm set Nurse Communication: Mobility status;Need for lift equipment PT Visit Diagnosis: Other abnormalities of gait and mobility (R26.89);Muscle weakness (generalized) (M62.81)    Time: 1062-6948 PT Time Calculation (min) (ACUTE ONLY): 31 min   Charges:   PT Evaluation $PT Eval Low Complexity: 1 Low PT Treatments $Therapeutic  Activity: 8-22 mins       Jannette Spanner PT, DPT Acute Rehabilitation Services Pager: [phone] Office: [phone]  York Ram E 07/15/2019, 3:44 PM

## 2019-07-15 NOTE — Final Consult Note (Signed)
3 Days Post-Op  Subjective: CC: Doing well. Colonic stent placed by GI 6/11. Tolerating soft diet without abdominal pain, n/v. Passing flatus. Having bowel movements. He is unsure of consistency of the BM's.   Objective: Vital signs in last 24 hours: Temp:  [97.7 F (36.5 C)-100.3 F (37.9 C)] 98.8 F (37.1 C) (06/14 0800) Pulse Rate:  [66-132] 106 (06/14 0800) Resp:  [5-31] 16 (06/14 0800) BP: (106-158)/(41-98) 129/56 (06/14 0800) SpO2:  [94 %-100 %] 98 % (06/14 0800) Weight:  [99.3 kg] 99.3 kg (06/14 0500) Last BM Date: 07/14/19  Intake/Output from previous day: 06/13 0701 - 06/14 0700 In: 466.9 [P.O.:240; I.V.:178.8; IV Piggyback:48] Out: 650 [Urine:650] Intake/Output this shift: No intake/output data recorded.  PE: Gen:  Alert, NAD, pleasant Pulm:  Rate and effort normal Abd: Soft, NT/ND, +BS Ext:  No LE edema. SCD's in place Psych: A&Ox3  Skin: no rashes noted, warm and dry  Lab Results:  Recent Labs    07/13/19 0950 07/14/19 0527  WBC 12.8* 11.5*  HGB 9.8* 8.7*  HCT 31.5* 27.1*  PLT 224 188   BMET Recent Labs    07/14/19 0527  NA 135  K 3.5  CL 101  CO2 26  GLUCOSE 221*  BUN 17  CREATININE 1.04  CALCIUM 7.8*   PT/INR No results for input(s): LABPROT, INR in the last 72 hours. CMP     Component Value Date/Time   NA 135 07/14/2019 0527   K 3.5 07/14/2019 0527   CL 101 07/14/2019 0527   CO2 26 07/14/2019 0527   GLUCOSE 221 (H) 07/14/2019 0527   BUN 17 07/14/2019 0527   CREATININE 1.04 07/14/2019 0527   CREATININE 1.39 (H) 07/09/2019 0938   CALCIUM 7.8 (L) 07/14/2019 0527   PROT 5.6 (L) 07/12/2019 0504   ALBUMIN 2.7 (L) 07/12/2019 0504   AST 15 07/12/2019 0504   AST 23 07/09/2019 0938   ALT 11 07/12/2019 0504   ALT 13 07/09/2019 0938   ALKPHOS 112 07/12/2019 0504   BILITOT 0.7 07/12/2019 0504   BILITOT 0.7 07/09/2019 0938   GFRNONAA >60 07/14/2019 0527   GFRNONAA 51 (L) 07/09/2019 0938   GFRAA >60 07/14/2019 0527   GFRAA 59 (L)  07/09/2019 0938   Lipase  No results found for: LIPASE     Studies/Results: DG Abd 2 Views  Result Date: 07/13/2019 CLINICAL DATA:  Status post colonic stent placement EXAM: ABDOMEN - 2 VIEW COMPARISON:  July 10, 2019 FINDINGS: There is an apparent stent in the sigmoid colon region overlying the sacrum. There is no appreciable bowel dilatation or air-fluid level to suggest bowel obstruction. No free air. Nasogastric tube tip and side port in stomach. There are surgical clips in the upper abdomen. Lung bases are clear. IMPRESSION: Apparent stent in the sigmoid colon region overlying the sacrum. Nasogastric tube tip and side port in stomach. No bowel obstruction or free air. Lung bases clear. Electronically Signed   By: Lowella Grip III M.D.   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 Johnney Killian completely obstructing mass beginning at 16 cm from the anal verge,  sigmoid colon polyp - Liver Bx 03/29/19 w/ metastatic adenocarcinoma  - On FOLFOX chemo per Dr. [name], completed cycle 4 06/25/19 - S/p Flex Sig by Dr. Rush Landmark showed Malignant completely obstructing tumor from 17 to 20 cm proximal to the anus. Colonic stent was placed. Patient now tolerating a diet and having bowel function.  - Okay for discharge from our standpoint. Will arrange colorectal follow up. We will sign off.    LOS: 6 days    Jillyn Ledger , [facility] 07/15/2019, 9:00 AM Please see Amion for pager number during day hours 7:00am-4:30pm

## 2019-07-15 NOTE — Progress Notes (Addendum)
Progress Note  Patient Name: Alexandria Current. Date of Encounter: 07/15/2019  [facility] Cardiologist: Mertie Moores, MD   Subjective   No chest pain and no SOB  Inpatient Medications    Scheduled Meds: . Chlorhexidine Gluconate Cloth  6 each Topical Q0600  . digoxin  0.125 mg Intravenous Daily  . docusate sodium  200 mg Oral BID  . insulin aspart  0-15 Units Subcutaneous TID WC  . insulin aspart  0-5 Units Subcutaneous QHS  . metoprolol tartrate  5 mg Intravenous Q6H  . pantoprazole (PROTONIX) IV  40 mg Intravenous QHS  . polyethylene glycol  17 g Oral Daily  . sodium chloride flush  10-40 mL Intracatheter Q12H   Continuous Infusions: . diltiazem (CARDIZEM) infusion 10 mg/hr (07/15/19 0900)   PRN Meds: alum & mag hydroxide-simeth, fentaNYL (SUBLIMAZE) injection, levalbuterol, liver oil-zinc oxide, ondansetron **OR** ondansetron (ZOFRAN) IV, phenol, sodium chloride flush   Vital Signs    Vitals:   07/15/19 0800 07/15/19 0900 07/15/19 1000 07/15/19 1203  BP: (!) 129/56 (!) 91/47 (!) 119/49   Pulse: (!) 106 (!) 102 (!) 104   Resp: 16 16 19    Temp: 98.8 F (37.1 C)   98 F (36.7 C)  TempSrc: Oral   Oral  SpO2: 98% 96% 97%   Weight:      Height:        Intake/Output Summary (Last 24 hours) at 07/15/2019 1211 Last data filed at 07/15/2019 0900 Gross per 24 hour  Intake 399.05 ml  Output 650 ml  Net -250.95 ml   Last 3 Weights 07/15/2019 07/14/2019 07/13/2019  Weight (lbs) 218 lb 14.7 oz 219 lb 5.7 oz 219 lb 5.7 oz  Weight (kg) 99.3 kg 99.5 kg 99.5 kg      Telemetry    A fib rate improved though freq up to 105 to 110  - Personally Reviewed  ECG    No new - Personally Reviewed  Physical Exam   GEN: No acute distress.   Neck: No JVD sitting up in bed Cardiac: irreg irreg, no murmurs, rubs, or gallops.  Respiratory: Clear to auscultation bilaterally, ant.   GI: Soft, nontender, non-distended  MS: No edema; No deformity. Neuro:  Nonfocal  Psych:  Normal affect   Labs    High Sensitivity Troponin:  No results for input(s): TROPONINIHS in the last 720 hours.    Chemistry Recent Labs  Lab 07/10/19 0346 07/10/19 0346 07/11/19 0445 07/11/19 0445 07/12/19 0504 07/14/19 0527 07/15/19 1040  NA 137   < > 139   < > 138 135 133*  K 4.2   < > 3.9   < > 4.0 3.5 3.8  CL 100   < > 100   < > 102 101 100  CO2 27   < > 28   < > 27 26 25   GLUCOSE 181*   < > 125*   < > 92 221* 245*  BUN 37*   < > 44*   < > 36* 17 19  CREATININE 1.18   < > 1.50*   < > 1.17 1.04 1.15  CALCIUM 8.6*   < > 8.4*   < > 8.1* 7.8* 7.7*  PROT 6.5  --  5.7*  --  5.6*  --   --   ALBUMIN 3.2*  --  2.8*  --  2.7*  --   --   AST 18  --  16  --  15  --   --  ALT 14  --  11  --  11  --   --   ALKPHOS 146*  --  124  --  112  --   --   BILITOT 0.9  --  0.6  --  0.7  --   --   GFRNONAA >60   < > 46*   < > >60 >60 >60  GFRAA >60   < > 54*   < > >60 >60 >60  ANIONGAP 10   < > 11   < > 9 8 8    < > = values in this interval not displayed.     Hematology Recent Labs  Lab 07/12/19 0504 07/13/19 0950 07/14/19 0527  WBC 11.5* 12.8* 11.5*  RBC 2.94* 3.40* 2.97*  HGB 8.4* 9.8* 8.7*  HCT 27.6* 31.5* 27.1*  MCV 93.9 92.6 91.2  MCH 28.6 28.8 29.3  MCHC 30.4 31.1 32.1  RDW 19.9* 19.5* 19.3*  PLT 176 224 188    BNPNo results for input(s): BNP, PROBNP in the last 168 hours.   DDimer No results for input(s): DDIMER in the last 168 hours.   Radiology    No results found.  Cardiac Studies     Patient Profile     69 y.o. male with a hx of ASCAD s/p CABG in 2017, Persistent AF, chronic systolic CHF, DM, Thoracic aortic aneurysm, throat/tongue CA and metastatic colorectal CAseen in consult for RVR in his persistent a fib due to new neoplasm of sigmoid colon.    Assessment & Plan    1. Persistentatrial flutter/fibrillation now with RVR  Change BB to po (outpt he was 100 BID) but start with lower dose due to BP 25mg  BID.  If Dr. Margaretann Loveless agrees,  Not on CCB at  home would change this last to 30 mg BID for now continue IV with increased HR. Though HR is overall up --may need IV lasix ? This may explain increasing HR though pt is comfortable in bed, not SOB   2. ASCAD -s/p remote CABG -denies any anginal sx -PO BB changed to IV due to N/V and bowel obstruction ? To po?  -currently not on statin therapy -no ASA due to bleeding issues with colon  3. Chronic systolic CHF -EF 86-76% on echo 04/2019 -PRN diuresis - now since admit + 1883 though wt is stable since the 9th   4. Malignant neoplasm of sigmoid colon -per oncology -S/P 4 cycles of FOLFOX - colonic obstruction post stent NG tube out  - port a cath access       For questions or updates, please contact [place] Please consult [email or web address] for contact info under        Signed, Cecilie Kicks, NP  07/15/2019, 12:11 PM    Patient seen and examined with Cecilie Kicks NP.  Agree as above, with the following exceptions and changes as noted below. HR has improved with IV lopressor from prior assessment by APP. Gen: NAD, CV: Irregular rhythm, normal rate, no murmurs, Lungs: clear, Abd: soft, Extrem: Warm, well perfused, no edema, Neuro/Psych: alert and oriented x 3, normal mood and affect. All available labs, radiology testing, previous records reviewed. He is feeling well overall and is extremely grateful for his care thus far.   On diltiazem infusion and prn lopressor IV. Will add metoprolol 25 mg BID. Does not appear volume overloaded but consider lasix 40 mg IV since he is ~ +2 L and EF is mildly reduced. Will continue to wean diltiazem  infusion and possible transition to oral tomorrow, as well as digoxin to oral in next 1-2 days.   Elouise Munroe, MD 07/15/19 3:13 PM

## 2019-07-16 ENCOUNTER — Other Ambulatory Visit: Payer: Medicare HMO

## 2019-07-16 ENCOUNTER — Inpatient Hospital Stay: Payer: Medicare HMO

## 2019-07-16 ENCOUNTER — Inpatient Hospital Stay: Payer: Medicare HMO | Admitting: Oncology

## 2019-07-16 LAB — GLUCOSE, CAPILLARY
Glucose-Capillary: 147 mg/dL — ABNORMAL HIGH (ref 70–99)
Glucose-Capillary: 184 mg/dL — ABNORMAL HIGH (ref 70–99)
Glucose-Capillary: 191 mg/dL — ABNORMAL HIGH (ref 70–99)
Glucose-Capillary: 154 mg/dL — ABNORMAL HIGH (ref 70–99)

## 2019-07-16 LAB — CBC
HCT: 25.3 % — ABNORMAL LOW (ref 39.0–52.0)
Hemoglobin: 8.1 g/dL — ABNORMAL LOW (ref 13.0–17.0)
MCH: 28.9 pg (ref 26.0–34.0)
MCHC: 32 g/dL (ref 30.0–36.0)
MCV: 90.4 fL (ref 80.0–100.0)
Platelets: 171 10*3/uL (ref 150–400)
RBC: 2.8 MIL/uL — ABNORMAL LOW (ref 4.22–5.81)
RDW: 18.4 % — ABNORMAL HIGH (ref 11.5–15.5)
WBC: 14 10*3/uL — ABNORMAL HIGH (ref 4.0–10.5)
nRBC: 0 % (ref 0.0–0.2)

## 2019-07-16 LAB — BASIC METABOLIC PANEL
Anion gap: 9 (ref 5–15)
BUN: 17 mg/dL (ref 8–23)
CO2: 23 mmol/L (ref 22–32)
Calcium: 7.6 mg/dL — ABNORMAL LOW (ref 8.9–10.3)
Chloride: 100 mmol/L (ref 98–111)
Creatinine, Ser: 1.01 mg/dL (ref 0.61–1.24)
GFR calc Af Amer: >60 mL/min (ref >60)
GFR calc non Af Amer: >60 mL/min (ref >60)
Glucose, Bld: 144 mg/dL — ABNORMAL HIGH (ref 70–99)
Potassium: 3.4 mmol/L — ABNORMAL LOW (ref 3.5–5.1)
Sodium: 132 mmol/L — ABNORMAL LOW (ref 135–145)

## 2019-07-16 LAB — MAGNESIUM: Magnesium: 1.3 mg/dL — ABNORMAL LOW (ref 1.7–2.4)

## 2019-07-16 MED ORDER — MAGNESIUM SULFATE 2 GM/50ML IV SOLN
2.0000 g | Freq: Once | INTRAVENOUS | Status: AC
  Administered 2019-07-16: 2 g via INTRAVENOUS
  Filled 2019-07-16: qty 50

## 2019-07-16 MED ORDER — POTASSIUM CHLORIDE 20 MEQ/15ML (10%) PO SOLN: 40.0000 meq | Freq: Once | ORAL | Status: DC

## 2019-07-16 MED ORDER — POTASSIUM CHLORIDE CRYS ER 20 MEQ PO TBCR
40.0000 meq | EXTENDED_RELEASE_TABLET | Freq: Once | ORAL | Status: AC
  Administered 2019-07-16: 40 meq via ORAL
  Filled 2019-07-16: qty 2

## 2019-07-16 MED ORDER — POLYETHYLENE GLYCOL 3350 17 G PO PACK: 17.0000 g | PACK | Freq: Every day | ORAL | Status: DC | PRN

## 2019-07-16 NOTE — Progress Notes (Addendum)
PROGRESS NOTE    [name]  LSL:[id number] DOB: 07-03-1950 DOA: 07/09/2019 PCP: Patient, No Pcp Per   Brief Narrative: Clarence Andrews. is a 69 y.o. male with a history of CAD s/p CABG, persistent atrial fibrillation/flutter not on anticoagulation secondary to rectal bleeding, chronic systolic heart failure, previous COVID-19 infection, metastatic colon cancer. Patient presented from his oncology office secondary to atrial fibrillation with RVR. While admitted he developed significant nausea and vomiting with CT evidence of obstruction of his sigmoid from is colonic mass.   Assessment & Plan:   Principal Problem:   Atrial fibrillation with RVR ([place]) Active Problems:   Cancer of left colon (HCC)   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HCC)   Type 2 diabetes mellitus without complication (HCC)   CAD (coronary artery disease)   Chronic systolic heart failure (HCC)   Large bowel obstruction (HCC)   Persistent atrial flutter/fibrillation with RVR Patient with a history of atrial fibrillation as an outpatient on metoprolol 100 mg BID and digoxin 0.125 mg daily. Patient's heart rate better controlled on regimen of Cardizem drip titrated to 7.5 mg/hr, digoxin 0.125 mg IV daily; now on metoprolol 25 mg PO BID. Heart rates continue to be elevated. Some soft blood pressure today. -Cardiology recommendations: Cardizem IV (titrated), digoxin IV, metoprolol 25 mg PO BID; pending today  Large bowel obstruction Patient with no bowel movement in 5 days prior to admission. No prior history of obstruction but has had abdominal surgery in the past. History of adenocarcinoma of the colon as mentioned below. Abdominal x-ray (6/8) significant for possible ileus. CT abdomen/pelvis (6/9) obtained and significant for severe/obstructing malignant stricture at sigmoid colon. General surgery and GI consulted on 6/9. Patient felt better after NG tube placement. Having bowel movements after colonic stent  placed which was significant for completely obstructing colonic mass. -GI recommendations: advance diet as able; stent placed 6/11 -General surgery recommendations pending. Signed off, outpatient follow-up  Leukocytosis Slightly worsened. No evidence of infection -Trend  Adenocarcinoma of the colon Metastatic adenocarcinoma to liver Recently diagnosed. Currently follows with medical oncology, Dr. [name] and is on active chemotherapy treatment.  Chronic systolic heart failure EF of 40-45%. Patient is euvolemic on admission. On metoprolol (Lopressor) as an outpatient. Patient is not on diuretic therapy. Rales noted on exam but chest x-ray without evidence of effusion or edema.  -Watch fluid status; daily weights and strict in/out  History of CABG CAD Patient was previously on aspirin which was discontinued secondary to rectal bleeding.  Diabetes mellitus, type 2 Patient is on glipizide as an outpatient. Last hemoglobin A1C of 7.8%.  -Continue SSI  AKI on  CKD stage II Baseline creatinine of 1.2. Peak creatinine of 1.5 which is now back to baseline with IV fluids. Resolved.   DVT prophylaxis: SCDs Code Status:   Code Status: Full Code Family Communication: None at bedside Disposition Plan: Transfer to telemetry once off of diltiazem drip per cardiology and anticipate discharge home tomorrow pending final specialist and PT/OT recommendations   Consultants:   Cardiology (6/8)  General surgery (6/9)  Gastroenterology (6/9)  Procedures:   FLEXIBLE SIGMOIDOSCOPY; COLONIC STENT PLACEMENT (07/12/2019) Impression:               - Preparation of the colon was fair but inadequate                            for appropriate screening purposes but not for  diagnostic/therapeutic intention..                           - Hemorrhoids found on digital rectal exam.                           - Malignant completely obstructing tumor from 17 to                             20 cm proximal to the anus.                           - Dilated in the sigmoid colon proximal to the                            obstructing colonic mass.                           - Wire placed beyond the stenosis, area injected                            with radiographic contrast to delineate stenosis                            further. Prosthesis placed.  Recommendation:           - The patient will be observed post-procedure,                            until all discharge criteria are met.                           - Patient to the PACU for recovery.                           - Return patient to hospital ward for ongoing care                            thereafter.                           - Observe clinical status.                           - May trial sips of clear liquids the rest of today                            but maintain NGT in place but can try to cap for                            now.                           - If patient tolerates the sips and KUB tomorrow                            (  already ordered) looks to have enteral stent still                            in good position, then would allow clear liquids to                            advance to full liquids throughout Saturday. On                            Sunday, if patient is doing well, then may                            transition to soft/pureed foods for next 48 hours.                            Then may transition to a low-residue diet moving                            forward. Stop all fiber supplementation.                           - Recommend that patient be initiated on Colace 200                            mg 1-2 times daily on Saturday. Would also                            recommend Miralax 1-2 capfuls daily in effort of                            trying to optimize stool consistency and not allow                            stent to become clogged in the coming days.                            - Stent should fully expand over the next 2-weeks.                           - The findings and recommendations were discussed                            with the patient.                           - The findings and recommendations were discussed                            with the referring physician.                           - The findings and recommendations were discussed  with the Hospital Team.  Antimicrobials:  None    Subjective: No concerns. Saddened that he will not be stable for discharge today. States he does not want to go to rehab at this time as he has business to complete at home.  Objective: Vitals:   07/16/19 0500 07/16/19 0600 07/16/19 0800 07/16/19 0900  BP: 124/69 111/64 (!) 105/49 (!) 103/50  Pulse: (!) 129 (!) 132 (!) 107 (!) 102  Resp: 17 (!) _0 Temp:   98.6 F (37 C)   TempSrc:   Oral   SpO2: 97% 99% 96% 99%  Weight: 99.3 kg     Height:        Intake/Output Summary (Last 24 hours) at 07/16/2019 1022 Last data filed at 07/16/2019 0900 Gross per 24 hour  Intake 183.79 ml  Output 300 ml  Net -116.21 ml   Filed Weights   07/14/19 0500 07/15/19 0500 07/16/19 0500  Weight: 99.5 kg 99.3 kg 99.3 kg    Examination:  General exam: Appears calm and comfortable Respiratory system: Clear to auscultation. Respiratory effort normal. Cardiovascular system: S1 & S2 heard, irregular rhythm, slightly fast rate. Gastrointestinal system: Abdomen is slightly distended, soft and nontender. No organomegaly or masses felt. Decreased bowel sounds heard. Central nervous system: Alert and oriented. No focal neurological deficits. Musculoskeletal: No calf tenderness Skin: No cyanosis. No rashes Psychiatry: Judgement and insight appear normal. Blunt affect     Data Reviewed: I have personally reviewed following labs and imaging studies  CBC Lab Results  Component Value Date   WBC 14.0 (H) 07/16/2019   RBC 2.80 (L) 07/16/2019    HGB 8.1 (L) 07/16/2019   HCT 25.3 (L) 07/16/2019   MCV 90.4 07/16/2019   MCH 28.9 07/16/2019   PLT 171 07/16/2019   MCHC 32.0 07/16/2019   RDW 18.4 (H) 07/16/2019   LYMPHSABS 0.6 (L) 07/09/2019   MONOABS 1.5 (H) 07/09/2019   EOSABS 0.0 07/09/2019   BASOSABS 0.1 53/97/6734     Last metabolic panel Lab Results  Component Value Date   NA 132 (L) 07/16/2019   K 3.4 (L) 07/16/2019   CL 100 07/16/2019   CO2 23 07/16/2019   BUN 17 07/16/2019   CREATININE 1.01 07/16/2019   GLUCOSE 144 (H) 07/16/2019   GFRNONAA >60 07/16/2019   GFRAA >60 07/16/2019   CALCIUM 7.6 (L) 07/16/2019   PHOS 2.6 05/02/2019   PROT 5.6 (L) 07/12/2019   ALBUMIN 2.7 (L) 07/12/2019   BILITOT 0.7 07/12/2019   ALKPHOS 112 07/12/2019   AST 15 07/12/2019   ALT 11 07/12/2019   ANIONGAP 9 07/16/2019    CBG (last 3)  Recent Labs    07/15/19 1634 07/15/19 2115 07/16/19 0811  GLUCAP 223* 218* 147*     GFR: Estimated Creatinine Clearance: 84.4 mL/min (by C-G formula based on SCr of 1.01 mg/dL).  Coagulation Profile: No results for input(s): INR, PROTIME in the last 168 hours.  Recent Results (from the past 240 hour(s))  SARS Coronavirus 2 by RT PCR (hospital order, performed in [facility] hospital lab) Nasopharyngeal Nasopharyngeal Swab     Status: None   Collection Time: 07/09/19 12:44 PM   Specimen: Nasopharyngeal Swab  Result Value Ref Range Status   SARS Coronavirus 2 NEGATIVE NEGATIVE Final    Comment: (NOTE) SARS-CoV-2 target nucleic acids are NOT DETECTED. The SARS-CoV-2 RNA is generally detectable in upper and lower respiratory specimens during the acute phase of infection. The lowest concentration of SARS-CoV-2 viral copies  this assay can detect is 250 copies / mL. A negative result does not preclude SARS-CoV-2 infection and should not be used as the sole basis for treatment or other patient management decisions.  A negative result may occur with improper specimen collection / handling,  submission of specimen other than nasopharyngeal swab, presence of viral mutation(s) within the areas targeted by this assay, and inadequate number of viral copies (<250 copies / mL). A negative result must be combined with clinical observations, patient history, and epidemiological information. Fact Sheet for Patients:   StrictlyIdeas.no Fact Sheet for Healthcare Providers: BankingDealers.co.za This test is not yet approved or cleared  by the [place] FDA and has been authorized for detection and/or diagnosis of SARS-CoV-2 by FDA under an Emergency Use Authorization (EUA).  This EUA will remain in effect (meaning this test can be used) for the duration of the COVID-19 declaration under Section 564(b)(1) of the Act, 21 U.S.C. section 360bbb-3(b)(1), unless the authorization is terminated or revoked sooner. Performed at [facility], [place] [address], [place], [place] [address]   MRSA PCR Screening     Status: None   Collection Time: 07/09/19  7:53 PM   Specimen: Nasal Mucosa; Nasopharyngeal  Result Value Ref Range Status   MRSA by PCR NEGATIVE NEGATIVE Final    Comment:        The GeneXpert MRSA Assay (FDA approved for NASAL specimens only), is one component of a comprehensive MRSA colonization surveillance program. It is not intended to diagnose MRSA infection nor to guide or monitor treatment for MRSA infections. Performed at [facility], [place] [address] [place], [place] [address]         Radiology Studies: No results found.      Scheduled Meds: . Chlorhexidine Gluconate Cloth  6 each Topical Q0600  . digoxin  0.125 mg Intravenous Daily  . docusate sodium  200 mg Oral BID  . insulin aspart  0-15 Units Subcutaneous TID WC  . insulin aspart  0-5 Units Subcutaneous QHS  . metoprolol tartrate  25 mg Oral BID  . pantoprazole (PROTONIX) IV  40 mg Intravenous QHS  . polyethylene  glycol  17 g Oral Daily  . sodium chloride flush  10-40 mL Intracatheter Q12H   Continuous Infusions: . diltiazem (CARDIZEM) infusion 7.5 mg/hr (07/16/19 0900)     LOS: 7 days     Cordelia Poche, MD [facility] 07/16/2019, 10:22 AM  If 7PM-7AM, please contact night-coverage [email or web address]

## 2019-07-16 NOTE — Progress Notes (Addendum)
Progress Note  Patient Name: Clarence Andrews. Date of Encounter: 07/16/2019  [facility] Cardiologist: Mertie Moores, MD   Subjective   No chest pain and no SOB.  HR up to 130s while sitting in bed   Inpatient Medications    Scheduled Meds: . Chlorhexidine Gluconate Cloth  6 each Topical Q0600  . digoxin  0.125 mg Intravenous Daily  . docusate sodium  200 mg Oral BID  . insulin aspart  0-15 Units Subcutaneous TID WC  . insulin aspart  0-5 Units Subcutaneous QHS  . metoprolol tartrate  25 mg Oral BID  . pantoprazole (PROTONIX) IV  40 mg Intravenous QHS  . sodium chloride flush  10-40 mL Intracatheter Q12H   Continuous Infusions: . diltiazem (CARDIZEM) infusion 7.5 mg/hr (07/16/19 1230)   PRN Meds: alum & mag hydroxide-simeth, fentaNYL (SUBLIMAZE) injection, levalbuterol, liver oil-zinc oxide, ondansetron **OR** ondansetron (ZOFRAN) IV, phenol, polyethylene glycol, sodium chloride flush   Vital Signs    Vitals:   07/16/19 0900 07/16/19 1000 07/16/19 1100 07/16/19 1200  BP: (!) 103/50 104/64 (!) 104/59 (!) 113/56  Pulse: (!) 102 (!) 121 (!) 109 (!) 107  Resp: 14 (!) 23 (!) 28 13  Temp:    98.9 F (37.2 C)  TempSrc:    Oral  SpO2: 99% 98% 96% 95%  Weight:      Height:        Intake/Output Summary (Last 24 hours) at 07/16/2019 1321 Last data filed at 07/16/2019 1230 Gross per 24 hour  Intake 260 ml  Output 300 ml  Net -40 ml   Last 3 Weights 07/16/2019 07/15/2019 07/14/2019  Weight (lbs) 218 lb 14.7 oz 218 lb 14.7 oz 219 lb 5.7 oz  Weight (kg) 99.3 kg 99.3 kg 99.5 kg      Telemetry    A fib rate controlled at times other times up to 130  - Personally Reviewed  ECG    No new - Personally Reviewed  Physical Exam   GEN: No acute distress.   Neck: No JVD Cardiac: irreg irreg, no murmurs, rubs, or gallops.  Respiratory: Clear to auscultation bilaterally, ant.. GI: Soft, nontender, non-distended  MS: No edema; No deformity. Neuro:  Nonfocal  Psych:  Normal affect   Labs    High Sensitivity Troponin:  No results for input(s): TROPONINIHS in the last 720 hours.    Chemistry Recent Labs  Lab 07/10/19 0346 07/10/19 0346 07/11/19 0445 07/11/19 0445 07/12/19 0504 07/12/19 0504 07/14/19 0527 07/15/19 1040 07/16/19 0548  NA 137   < > 139   < > 138   < > 135 133* 132*  K 4.2   < > 3.9   < > 4.0   < > 3.5 3.8 3.4*  CL 100   < > 100   < > 102   < > 101 100 100  CO2 27   < > 28   < > 27   < > 26 25 23   GLUCOSE 181*   < > 125*   < > 92   < > 221* 245* 144*  BUN 37*   < > 44*   < > 36*   < > 17 19 17   CREATININE 1.18   < > 1.50*   < > 1.17   < > 1.04 1.15 1.01  CALCIUM 8.6*   < > 8.4*   < > 8.1*   < > 7.8* 7.7* 7.6*  PROT 6.5  --  5.7*  --  5.6*  --   --   --   --   ALBUMIN 3.2*  --  2.8*  --  2.7*  --   --   --   --   AST 18  --  16  --  15  --   --   --   --   ALT 14  --  11  --  11  --   --   --   --   ALKPHOS 146*  --  124  --  112  --   --   --   --   BILITOT 0.9  --  0.6  --  0.7  --   --   --   --   GFRNONAA >60   < > 46*   < > >60   < > >60 >60 >60  GFRAA >60   < > 54*   < > >60   < > >60 >60 >60  ANIONGAP 10   < > 11   < > 9   < > 8 8 9    < > = values in this interval not displayed.     Hematology Recent Labs  Lab 07/13/19 0950 07/14/19 0527 07/16/19 0548  WBC 12.8* 11.5* 14.0*  RBC 3.40* 2.97* 2.80*  HGB 9.8* 8.7* 8.1*  HCT 31.5* 27.1* 25.3*  MCV 92.6 91.2 90.4  MCH 28.8 29.3 28.9  MCHC 31.1 32.1 32.0  RDW 19.5* 19.3* 18.4*  PLT 224 188 171    BNPNo results for input(s): BNP, PROBNP in the last 168 hours.   DDimer No results for input(s): DDIMER in the last 168 hours.   Radiology    No results found.  Cardiac Studies   Echo 04/28/19 IMPRESSIONS    1. Difficult to assess LV systolic function given tachycardia and poor  windows, but appears mildly to moderately reduced. Left ventricular  ejection fraction, by estimation, is 40 to 45%. Would consider repeat TTE  with contrast once heart rate  better  controlled. There is moderate left ventricular hypertrophy. Left  ventricular diastolic parameters are indeterminate.  2. Right ventricle is poorly visualized, but function appears moderately  reduced  3. The mitral valve is normal in structure. No evidence of mitral valve  regurgitation.  4. The aortic valve is tricuspid. Aortic valve regurgitation is not  visualized. No aortic stenosis is present.  5. Aortic dilatation noted. 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  6. The inferior vena cava is dilated in size with <50% respiratory  variability, suggesting right atrial pressure of 15 mmHg.   FINDINGS  Left Ventricle: Left ventricular ejection fraction, by estimation, is 40  to 45%. The left ventricle has mildly decreased function. The left  ventricle demonstrates global hypokinesis. The left ventricular internal  cavity size was small. There is moderate  left ventricular hypertrophy. Left ventricular diastolic parameters are  indeterminate.   Right Ventricle: The right ventricular size is not well visualized. Right  vetricular wall thickness was not assessed. Right ventricular systolic  function is moderately reduced.   Left Atrium: Left atrial size was not well visualized.   Right Atrium: Right atrial size was not well visualized.   Pericardium: Trivial pericardial effusion is present.   Mitral Valve: The mitral valve is normal in structure. No evidence of  mitral valve regurgitation.   Tricuspid Valve: The tricuspid valve is normal in structure. Tricuspid  valve regurgitation is trivial.   Aortic Valve:  The aortic valve is tricuspid. Aortic valve regurgitation is  not visualized. No aortic stenosis is present.   Pulmonic Valve: The pulmonic valve was not well visualized. Pulmonic valve  regurgitation is not visualized.   Aorta: Aortic dilatation noted. 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   Venous: The  inferior vena cava is dilated in size with less than 50%  respiratory variability, suggesting right atrial pressure of 15 mmHg.   IAS/Shunts: The interatrial septum was not well visualized.   Patient Profile     69 y.o. male with a hx of ASCAD s/p CABG in 2017, Persistent AF, chronic systolic CHF, DM, Thoracic aortic aneurysm, throat/tongue CA and metastatic colorectal CAseen in consult for RVR in his persistent a fib due to new neoplasm of sigmoid colon.     Assessment & Plan    1. Persistentatrial flutter/fibrillation now with RVR Change BB to po (outpt he was 100 BID) but start with lower dose due to BP 25mg  BID.  change dig to po --   Not on CCB at home would change this last to 30 mg BID vs continue IV with increased HR. Though HR is overall up --may need IV lasix ? This may explain increasing HR though pt is comfortable in bed, not SOB   2. ASCAD -s/p remote CABG -denies any anginal sx -PO BB changed to IV due to N/V and bowel obstruction  -currently not on statin therapy -no ASA due to bleeding issues with colon  3. Chronic systolic CHF -EF 43-83% on echo 04/2019 -PRN diuresis - now since admit + 1883 though wt is stable since the 9th   4. Malignant neoplasm of sigmoid colon -per oncology -S/P 4 cycles of FOLFOX - colonic obstruction post stentNG tube out - port a cath access       For questions or updates, please contact [place] Please consult [email or web address] for contact info under        Signed, Cecilie Kicks, NP  07/16/2019, 1:21 PM    Patient seen and examined with Cecilie Kicks NP.  Agree as above, with the following exceptions and changes as noted below. Overall no CP or SOB. Gen: NAD, CV: iRRR, no murmurs, Lungs: clear, Abd: soft, Extrem: Warm, well perfused, no edema, Neuro/Psych: alert and oriented x 3, normal mood and affect. All available labs, radiology testing, previous records reviewed.   I am going to repeat a limited echocardiogram to  reassess LV function now that HR is intermittently controlled. Would perform echo if HR is well controlled to get a better sense of LVEF, per last report it was a challenging assessment. If EF remains reduced, will continue to address HF recommendations. If EF appears more preserved, can likely add po diltiazem and escalate rate control.   Elouise Munroe, MD 07/16/19 9:01 PM

## 2019-07-17 ENCOUNTER — Inpatient Hospital Stay (HOSPITAL_COMMUNITY): Payer: Medicare HMO

## 2019-07-17 DIAGNOSIS — I5022 Chronic systolic (congestive) heart failure: Secondary | ICD-10-CM

## 2019-07-17 LAB — CBC
HCT: 25.1 % — ABNORMAL LOW (ref 39.0–52.0)
Hemoglobin: 7.9 g/dL — ABNORMAL LOW (ref 13.0–17.0)
MCH: 29 pg (ref 26.0–34.0)
MCHC: 31.5 g/dL (ref 30.0–36.0)
MCV: 92.3 fL (ref 80.0–100.0)
Platelets: 173 10*3/uL (ref 150–400)
RBC: 2.72 MIL/uL — ABNORMAL LOW (ref 4.22–5.81)
RDW: 18.3 % — ABNORMAL HIGH (ref 11.5–15.5)
WBC: 10.7 10*3/uL — ABNORMAL HIGH (ref 4.0–10.5)
nRBC: 0 % (ref 0.0–0.2)

## 2019-07-17 LAB — ECHOCARDIOGRAM LIMITED
Height: 74 in
Weight: 3502.67 oz

## 2019-07-17 LAB — MAGNESIUM: Magnesium: 1.6 mg/dL — ABNORMAL LOW (ref 1.7–2.4)

## 2019-07-17 LAB — BASIC METABOLIC PANEL
Anion gap: 7 (ref 5–15)
BUN: 19 mg/dL (ref 8–23)
CO2: 25 mmol/L (ref 22–32)
Calcium: 8 mg/dL — ABNORMAL LOW (ref 8.9–10.3)
Chloride: 101 mmol/L (ref 98–111)
Creatinine, Ser: 1.08 mg/dL (ref 0.61–1.24)
GFR calc Af Amer: >60 mL/min (ref >60)
GFR calc non Af Amer: >60 mL/min (ref >60)
Glucose, Bld: 184 mg/dL — ABNORMAL HIGH (ref 70–99)
Potassium: 3.7 mmol/L (ref 3.5–5.1)
Sodium: 133 mmol/L — ABNORMAL LOW (ref 135–145)

## 2019-07-17 LAB — GLUCOSE, CAPILLARY
Glucose-Capillary: 183 mg/dL — ABNORMAL HIGH (ref 70–99)
Glucose-Capillary: 201 mg/dL — ABNORMAL HIGH (ref 70–99)
Glucose-Capillary: 174 mg/dL — ABNORMAL HIGH (ref 70–99)
Glucose-Capillary: 169 mg/dL — ABNORMAL HIGH (ref 70–99)

## 2019-07-17 MED ORDER — MAGNESIUM SULFATE 2 GM/50ML IV SOLN
2.0000 g | Freq: Once | INTRAVENOUS | Status: AC
  Administered 2019-07-17: 2 g via INTRAVENOUS

## 2019-07-17 MED ORDER — PERFLUTREN LIPID MICROSPHERE
1.0000 mL | INTRAVENOUS | Status: AC | PRN
  Administered 2019-07-17: 2 mL via INTRAVENOUS
  Filled 2019-07-17: qty 10

## 2019-07-17 MED ORDER — DOXYCYCLINE HYCLATE 100 MG PO TABS
200.0000 mg | ORAL_TABLET | Freq: Once | ORAL | Status: AC
  Administered 2019-07-17: 200 mg via ORAL
  Filled 2019-07-17: qty 2

## 2019-07-17 MED ORDER — DILTIAZEM HCL 30 MG PO TABS
30.0000 mg | ORAL_TABLET | Freq: Three times a day (TID) | ORAL | Status: DC
  Administered 2019-07-18 (×2): 30 mg via ORAL
  Filled 2019-07-17 (×2): qty 1

## 2019-07-17 MED ORDER — DIGOXIN 125 MCG PO TABS
0.1250 mg | ORAL_TABLET | Freq: Every day | ORAL | Status: DC
  Administered 2019-07-18 – 2019-07-19 (×2): 0.125 mg via ORAL
  Filled 2019-07-17 (×2): qty 1

## 2019-07-17 NOTE — Progress Notes (Signed)
Pt determined he is going home alone. Assisted pt to [facility] for BM. Pt was moderate assist +2 to [facility]. Pt was 3+ max assist to get up from [facility] to be cleaned by staff. Pt unable to maintain balance and clean self. Pt walked 2 steps to bed with 2+assist and walker. Pt very weak and still informs this RN he can take care of self at home. Will continue to monitor and communicate with MD.

## 2019-07-17 NOTE — Progress Notes (Addendum)
PROGRESS NOTE    [name]  CVE:[id number] DOB: 06-Sep-1950 DOA: 07/09/2019 PCP: Patient, No Pcp Per   Chief Complaint  Patient presents with  . Weakness   Brief Narrative: Dahir Ayer. is Emmelia Holdsworth 69 y.o. male with Aristeo Hankerson history of CAD s/p CABG, persistent atrial fibrillation/flutter not on anticoagulation secondary to rectal bleeding, chronic systolic heart failure, previous COVID-19 infection, metastatic colon cancer. Patient presented from his oncology office secondary to atrial fibrillation with RVR. While admitted he developed significant nausea and vomiting with CT evidence of obstruction of his sigmoid from is colonic mass.  Assessment & Plan:   Principal Problem:   Atrial fibrillation with RVR ([place]) Active Problems:   Cancer of left colon (HCC)   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HCC)   Type 2 diabetes mellitus without complication (HCC)   CAD (coronary artery disease)   Chronic systolic heart failure (HCC)   Large bowel obstruction (HCC)  Persistent atrial flutter/fibrillation with RVR Patient with Roy Snuffer history of atrial fibrillation as an outpatient on metoprolol 100 mg BID and digoxin 0.125 mg daily.  Metoprolol 25 mg PO BID Transition digoxin to PO per cards Currently on dilt gtt - per cards Repeat echo with EF 55-60%, severe LVH of basal septal segment (see report) Cardiology c/s, appreciate recs Not on anticoagulation due to risk of rectal bleeding   Large bowel obstruction Patient with no bowel movement in 5 days prior to admission. No prior history of obstruction but has had abdominal surgery in the past. History of adenocarcinoma of the colon as mentioned below. Abdominal x-ray (6/8) significant for possible ileus. CT abdomen/pelvis (6/9) obtained and significant for severe/obstructing malignant stricture at sigmoid colon. General surgery and GI consulted on 6/9. Patient felt better after NG tube placement. Having bowel movements after colonic stent placed which  was significant for completely obstructing colonic mass. -GI recommendations: advance diet as able; stent placed 6/11 -General surgery recommendations pending. Signed off, outpatient follow-up  Leukocytosis Improved, continue to monitor  Adenocarcinoma of the colon Metastatic adenocarcinoma to liver Recently diagnosed. Currently follows with medical oncology, Dr. [name] and is on active chemotherapy treatment.  Chronic systolic heart failure EF of 40-45%. Patient is euvolemic on admission. On metoprolol (Lopressor) as an outpatient. Patient is not on diuretic therapy. Rales noted on exam but chest x-ray without evidence of effusion or edema.  - 6/16 EF 55-60%, severe LVH (see report) -Watch fluid status; daily weights and strict in/out  History of CABG CAD Patient was previously on aspirin which was discontinued secondary to rectal bleeding.  Diabetes mellitus, type 2 Patient is on glipizide as an outpatient. Last hemoglobin A1C of 7.8%.  -Continue SSI  AKI on  CKD stage II Baseline creatinine of 1.2. Peak creatinine of 1.5 which is now back to baseline with IV fluids. Resolved.  Tick Bite: tick found on L chest today, removed with tweezer.  Follow for rash.  Given 200 mg doxycycline x1 for ppx.  Therapy recommending SNF.  Pt resistant to this idea, but I think it's unsafe for discharge home as he has no support at home.  Will continue to encourage rehab.  DVT prophylaxis: SCD Code Status: full  Family Communication: none at bedside Disposition:   Status is: Inpatient  Remains inpatient appropriate because:Inpatient level of care appropriate due to severity of illness   Dispo: The patient is from: Home              Anticipated d/c is to: pending  Anticipated d/c date is: 3 days              Patient currently is not medically stable to d/c.   Consultants:   Cardiology  Procedures:  Echo IMPRESSIONS    1. Left ventricular ejection fraction, by  estimation, is 55-60%. The left  ventricle has normal function. Left ventricular endocardial border not  optimally defined to evaluate regional wall motion even with definity  contrast. There is severe left  ventricular hypertrophy of the basal-septal segment. Left ventricular  diastolic parameters are indeterminate.  2. Right ventricular systolic function was not well visualized. The right  ventricular size is normal. There is normal pulmonary artery systolic  pressure. The estimated right ventricular systolic pressure is 51.8 mmHg.  3. The mitral valve is normal in structure. No evidence of mitral valve  regurgitation. No evidence of mitral stenosis.  4. The aortic valve is tricuspid. Aortic valve regurgitation is trivial.  Mild aortic valve sclerosis is present, with no evidence of aortic valve  stenosis.  5. Aortic dilatation noted. There is mild dilatation at the level of the  sinuses of Valsalva measuring 43 mm.  6. The inferior vena cava is dilated in size with <50% respiratory  variability, suggesting right atrial pressure of 15 mmHg.   Impression - Preparation of the colon was fair but inadequate for appropriate screening purposes but not for diagnostic/therapeutic intention.. - Hemorrhoids found on digital rectal exam. - Malignant completely obstructing tumor from 17 to 20 cm proximal to the anus. - Dilated in the sigmoid colon proximal to the obstructing colonic mass. - Wire placed beyond the stenosis, area injected with radiographic contrast to delineate stenosis further. Prosthesis placed. Recommendations - The patient will be observed post-procedure, until all discharge criteria are met. - Patient to the PACU for recovery. - Return patient to hospital ward for ongoing care thereafter. - Observe clinical status. - May trial sips of clear liquids the rest of today but maintain NGT in place but can try to cap for now. - If patient tolerates the sips and KUB tomorrow  (already ordered) looks to have enteral stent still in good position, then would allow clear liquids to advance to full liquids throughout Saturday. On _0 0 mg Oral  Once 07/17/19 1419 07/17/19 1637     Subjective: No new complaints Wants to go home  Objective: Vitals:   07/17/19 1200 07/17/19 1203 07/17/19 1400 07/17/19 1541  BP:  94/61 (!) 121/56 (!) 126/58  Pulse:  (!) 117 (!) 103 89  Resp:  _1 Temp: 99 F (37.2 C)     TempSrc: Oral  SpO2:  97%  100%  Weight:      Height:        Intake/Output Summary (Last 24 hours) at 07/17/2019 1613 Last data filed at 07/17/2019 1300 Gross per 24 hour  Intake 158.64 ml  Output 550 ml  Net -391.36 ml   Filed Weights   07/15/19 0500 07/16/19 0500 07/17/19 0500  Weight: 99.3 kg 99.3 kg 99.3 kg    Examination:  General exam: Appears calm and comfortable  Respiratory system: Clear to auscultation. Respiratory effort normal. Cardiovascular system: S1 & S2 heard, RRR. Gastrointestinal system: Abdomen is nondistended, soft and nontender.  Central nervous system: Alert and  oriented. No focal neurological deficits. Extremities: no LEE Skin: No rashes, lesions or ulcers Psychiatry: Judgement and insight appear normal. Mood & affect appropriate.   Data Reviewed: I have personally reviewed following labs and imaging studies  CBC: Recent Labs  Lab 07/12/19 0504 07/13/19 0950 07/14/19 0527 07/16/19 0548 07/17/19 0500  WBC 11.5* 12.8* 11.5* 14.0* 10.7*  HGB 8.4* 9.8* 8.7* 8.1* 7.9*  HCT 27.6* 31.5* 27.1* 25.3* 25.1*  MCV 93.9 92.6 91.2 90.4 92.3  PLT 176 224 188 171 416    Basic Metabolic Panel: Recent Labs  Lab 07/12/19 0504 07/12/19 0504 07/13/19 0950 07/14/19 0527 07/15/19 1040 07/16/19 0548 07/17/19 0500  NA 138  --   --  135 133* 132* 133*  K 4.0  --   --  3.5 3.8 3.4* 3.7  CL 102  --   --  101 100 100 101  CO2 27  --   --  _0 GLUCOSE 92  --   --  221* 245* 144* 184*  BUN 36*  --   --  _1 CREATININE 1.17  --   --  1.04 1.15 1.01 1.08  CALCIUM 8.1*  --   --  7.8* 7.7* 7.6* 8.0*  MG 2.0   < > 1.9 1.4* 1.5* 1.3* 1.6*   < > = values in this interval not displayed.    GFR: Estimated Creatinine Clearance: 79 mL/min (by C-G formula based on SCr of 1.08 mg/dL).  Liver Function Tests: Recent Labs  Lab 07/11/19 0445 07/12/19 0504  AST 16 15  ALT 11 11  ALKPHOS 124 112  BILITOT 0.6 0.7  PROT 5.7* 5.6*  ALBUMIN 2.8* 2.7*    CBG: Recent Labs  Lab 07/16/19 1143 07/16/19 1701 07/16/19 2143 07/17/19 0815 07/17/19 1143  GLUCAP 184* 191* 154* 183* 201*     Recent Results (from the past 240 hour(s))  SARS Coronavirus 2 by RT PCR (hospital order, performed in [facility] hospital lab) Nasopharyngeal Nasopharyngeal Swab     Status: None   Collection Time: 07/09/19 12:44 PM   Specimen: Nasopharyngeal Swab  Result Value Ref Range Status   SARS Coronavirus 2 NEGATIVE NEGATIVE Final    Comment: (NOTE) SARS-CoV-2 target nucleic acids are NOT DETECTED. The SARS-CoV-2 RNA is generally detectable in upper and  lower respiratory specimens during the acute phase of infection. The lowest concentration of SARS-CoV-2 viral copies this assay can detect is 250 copies / mL. Kimm Sider negative result does not preclude SARS-CoV-2 infection and should not be used as the sole basis for treatment or other patient management decisions.  Andron Marrazzo negative result may occur with improper specimen collection / handling, submission of specimen other than nasopharyngeal swab, presence of viral mutation(s) within the areas targeted by this assay, and inadequate number of viral copies (<250 copies /  mL). Deontre Allsup negative result must be combined with clinical observations, patient history, and epidemiological information. Fact Sheet for Patients:   StrictlyIdeas.no Fact Sheet for Healthcare Providers: BankingDealers.co.za This test is not yet approved or cleared  by the [place] FDA and has been authorized for detection and/or diagnosis of SARS-CoV-2 by FDA under an Emergency Use Authorization (EUA).  This EUA will remain in effect (meaning this test can be used) for the duration of the COVID-19 declaration under Section 564(b)(1) of the Act, 21 U.S.C. section 360bbb-3(b)(1), unless the authorization is terminated or revoked sooner. Performed at [facility], [place] [address], [place], [place] [address]   MRSA PCR Screening     Status: None   Collection Time: 07/09/19  7:53 PM   Specimen: Nasal Mucosa; Nasopharyngeal  Result Value Ref Range Status   MRSA by PCR NEGATIVE NEGATIVE Final    Comment:        The GeneXpert MRSA Assay (FDA approved for NASAL specimens only), is one component of Marguita Venning comprehensive MRSA colonization surveillance program. It is not intended to diagnose MRSA infection nor to guide or monitor treatment for MRSA infections. Performed at [facility], [place] [address] [place],  [address]          Radiology  Studies: ECHOCARDIOGRAM LIMITED  Result Date: 07/17/2019    ECHOCARDIOGRAM LIMITED REPORT   Patient Name:   Zakaree Mcclenahan. Date of Exam: 07/17/2019 Medical Rec #:  [id number]          Height:       74.0 in Accession #:    [id number]         Weight:       218.9 lb Date of Birth:  1950-07-02           BSA:          2.259 m Patient Age:    63 years           BP:           117/54 mmHg Patient Gender: M                  HR:           98 bpm. Exam Location:  Inpatient Procedure: Limited Echo, Intracardiac Opacification Agent, Color Doppler and            Cardiac Doppler Indications:    Congestive Heart Failure 428.0 / I50.9  History:        Patient has prior history of Echocardiogram examinations, most                 recent 04/28/2019. CAD, Prior CABG; Risk Factors:Diabetes.                 Malignant neoplasm of sigmoid colon, history of throat cancer.  Sonographer:    Darlina Sicilian RDCS Referring Phys: [id number] Nadean Corwin [place]  1. Left ventricular ejection fraction, by estimation, is 55-60%. The left ventricle has normal function. Left ventricular endocardial border not optimally defined to evaluate regional wall motion even with definity contrast. There is severe left ventricular hypertrophy of the basal-septal segment. Left ventricular diastolic parameters are indeterminate.  2. Right ventricular systolic function was not well visualized. The right ventricular size is normal. There is normal pulmonary artery systolic pressure. The estimated right ventricular systolic pressure is 48.8 mmHg.  3. The mitral valve is normal in structure. No evidence of mitral valve regurgitation. No evidence of mitral stenosis.  4. The aortic valve is tricuspid. Aortic valve regurgitation is trivial. Mild aortic valve sclerosis is present, with no evidence of aortic valve stenosis.  5. Aortic dilatation noted. There is mild dilatation at the level of the sinuses of Valsalva measuring 43 mm.  6. The inferior vena cava is  dilated in size with <50% respiratory variability, suggesting right atrial pressure of 15 mmHg. FINDINGS  Left Ventricle: Left ventricular ejection fraction, by estimation, is 55 to 60%. The left ventricle has normal function. Left ventricular endocardial border not optimally defined to evaluate regional wall motion. The left ventricular internal cavity size was normal in size. There is severe left ventricular hypertrophy of the basal-septal segment. Right Ventricle: The right ventricular size is normal. No increase in right ventricular wall thickness. Right ventricular systolic function was not well visualized. There is normal pulmonary artery systolic pressure. The tricuspid regurgitant velocity is  1.36 m/s, and with an assumed right atrial pressure of 15 mmHg, the estimated right ventricular systolic pressure is 17.5 mmHg. Left Atrium: Left atrial size was normal in size. Right Atrium: Right atrial size was normal in size. Pericardium: There is no evidence of pericardial effusion. Mitral Valve: The mitral valve is normal in structure. Normal mobility of the mitral valve leaflets. No evidence of mitral valve stenosis. Tricuspid Valve: The tricuspid valve is normal in structure. Tricuspid valve regurgitation is trivial. No evidence of tricuspid stenosis. Aortic Valve: The aortic valve is tricuspid. Aortic valve regurgitation is trivial. Mild aortic valve sclerosis is present, with no evidence of aortic valve stenosis. Pulmonic Valve: The pulmonic valve was normal in structure. Pulmonic valve regurgitation is not visualized. No evidence of pulmonic stenosis. Aorta: Aortic dilatation noted. There is mild dilatation at the level of the sinuses of Valsalva measuring 43 mm. Venous: The inferior vena cava is dilated in size with less than 50% respiratory variability, suggesting right atrial pressure of 15 mmHg. IAS/Shunts: No atrial level shunt detected by color flow Doppler.  LEFT VENTRICLE PLAX 2D LVIDd:         4.30  cm LVIDs:         3.55 cm LV PW:         1.05 cm LV IVS:        1.80 cm LVOT diam:     2.20 cm LV SV:         48 LV SV Index:   21 LVOT Area:     3.80 cm  LV Volumes (MOD) LV vol d, MOD A2C: 82.9 ml LV vol d, MOD A4C: 173.0 ml LV vol s, MOD A2C: 56.7 ml LV vol s, MOD A4C: 88.6 ml LV SV MOD A2C:     26.2 ml LV SV MOD A4C:     173.0 ml LV SV MOD BP:      51.9 ml LEFT ATRIUM         Index LA diam:    2.40 cm 1.06 cm/m  AORTIC VALVE LVOT Vmax:   75.00 cm/s LVOT Vmean:  57.000 cm/s LVOT VTI:    0.126 m  AORTA Ao Root diam: 4.30 cm MITRAL VALVE               TRICUSPID VALVE MV Area (PHT): 4.96 cm    TR Peak grad:   7.4 mmHg MV Decel Time: 153 msec    TR Vmax:        136.00 cm/s MV E velocity: 95.20 cm/s  SHUNTS                            Systemic VTI:  0.13 m                            Systemic Diam: 2.20 cm Fransico Him MD Electronically signed by Fransico Him MD Signature Date/Time: 07/17/2019/10:43:14 AM    Final         Scheduled Meds: . Chlorhexidine Gluconate Cloth  6 each Topical Q0600  . digoxin  0.125 mg Oral Daily  . docusate sodium  200 mg Oral BID  . doxycycline  200 mg Oral Once  . insulin aspart  0-15 Units Subcutaneous TID WC  . insulin aspart  0-5 Units Subcutaneous QHS  . metoprolol tartrate  25 mg Oral BID  . pantoprazole (PROTONIX) IV  40 mg Intravenous QHS  . sodium chloride flush  10-40 mL Intracatheter Q12H   Continuous Infusions: . diltiazem (CARDIZEM) infusion 5 mg/hr (07/17/19 1300)     LOS: 8 days    Time spent: over 30 min    Fayrene Helper, MD [facility]   To contact the attending provider between 7A-7P or the covering provider during after hours 7P-7A, please log into the web site [email or web address] and access using universal [place] password for that web site. If you do not have the password, please call the hospital operator.  07/17/2019, 4:13 PM
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
## 2019-07-17 NOTE — Progress Notes (Addendum)
Progress Note  Patient Name: Clarence Andrews. Date of Encounter: 07/17/2019  [facility] Cardiologist: Mertie Moores, MD   Subjective   No chest pain or SOB , wants to go home PT here to ambulate  Inpatient Medications    Scheduled Meds: . Chlorhexidine Gluconate Cloth  6 each Topical Q0600  . digoxin  0.125 mg Intravenous Daily  . docusate sodium  200 mg Oral BID  . insulin aspart  0-15 Units Subcutaneous TID WC  . insulin aspart  0-5 Units Subcutaneous QHS  . metoprolol tartrate  25 mg Oral BID  . pantoprazole (PROTONIX) IV  40 mg Intravenous QHS  . sodium chloride flush  10-40 mL Intracatheter Q12H   Continuous Infusions: . diltiazem (CARDIZEM) infusion 5 mg/hr (07/17/19 0924)   PRN Meds: alum & mag hydroxide-simeth, fentaNYL (SUBLIMAZE) injection, levalbuterol, liver oil-zinc oxide, ondansetron **OR** ondansetron (ZOFRAN) IV, perflutren lipid microspheres (DEFINITY) IV suspension, phenol, polyethylene glycol, sodium chloride flush   Vital Signs    Vitals:   07/17/19 0700 07/17/19 0800 07/17/19 0815 07/17/19 0900  BP: (!) 123/48 (!) 117/54  (!) 117/58  Pulse: 92 98  91  Resp: 16 (!) 27  (!) 23  Temp:   98.4 F (36.9 C)   TempSrc:   Oral   SpO2: 100% 95%  100%  Weight:      Height:        Intake/Output Summary (Last 24 hours) at 07/17/2019 1021 Last data filed at 07/17/2019 0924 Gross per 24 hour  Intake 243.08 ml  Output 550 ml  Net -306.92 ml   Last 3 Weights 07/17/2019 07/16/2019 07/15/2019  Weight (lbs) 218 lb 14.7 oz 218 lb 14.7 oz 218 lb 14.7 oz  Weight (kg) 99.3 kg 99.3 kg 99.3 kg      Telemetry    A fib mostly controlled at times up to 130s  - Personally Reviewed  ECG    No new - Personally Reviewed  Physical Exam   GEN: No acute distress.   Neck: No JVD Cardiac: irreg irreg, no murmurs, rubs, or gallops.  Respiratory: Clear to auscultation bilaterally.ant. GI: Soft, nontender, non-distended  MS: No edema; No deformity. Neuro:   Nonfocal  Psych: Normal affect   Labs    High Sensitivity Troponin:  No results for input(s): TROPONINIHS in the last 720 hours.    Chemistry Recent Labs  Lab 07/11/19 0445 07/11/19 0445 07/12/19 0504 07/14/19 0527 07/15/19 1040 07/16/19 0548 07/17/19 0500  NA 139   < > 138   < > 133* 132* 133*  K 3.9   < > 4.0   < > 3.8 3.4* 3.7  CL 100   < > 102   < > 100 100 101  CO2 28   < > 27   < > 25 23 25   GLUCOSE 125*   < > 92   < > 245* 144* 184*  BUN 44*   < > 36*   < > 19 17 19   CREATININE 1.50*   < > 1.17   < > 1.15 1.01 1.08  CALCIUM 8.4*   < > 8.1*   < > 7.7* 7.6* 8.0*  PROT 5.7*  --  5.6*  --   --   --   --   ALBUMIN 2.8*  --  2.7*  --   --   --   --   AST 16  --  15  --   --   --   --  ALT 11  --  11  --   --   --   --   ALKPHOS 124  --  112  --   --   --   --   BILITOT 0.6  --  0.7  --   --   --   --   GFRNONAA 46*   < > >60   < > >60 >60 >60  GFRAA 54*   < > >60   < > >60 >60 >60  ANIONGAP 11   < > 9   < > 8 9 7    < > = values in this interval not displayed.     Hematology Recent Labs  Lab 07/14/19 0527 07/16/19 0548 07/17/19 0500  WBC 11.5* 14.0* 10.7*  RBC 2.97* 2.80* 2.72*  HGB 8.7* 8.1* 7.9*  HCT 27.1* 25.3* 25.1*  MCV 91.2 90.4 92.3  MCH 29.3 28.9 29.0  MCHC 32.1 32.0 31.5  RDW 19.3* 18.4* 18.3*  PLT 188 171 173    BNPNo results for input(s): BNP, PROBNP in the last 168 hours.   DDimer No results for input(s): DDIMER in the last 168 hours.   Radiology    No results found.  Cardiac Studies   LIMITED ECHO 07/17/19 IMPRESSIONS    1. Left ventricular ejection fraction, by estimation, is 55-60%. The left  ventricle has normal function. Left ventricular endocardial border not  optimally defined to evaluate regional wall motion even with definity  contrast. There is severe left  ventricular hypertrophy of the basal-septal segment. Left ventricular  diastolic parameters are indeterminate.  2. Right ventricular systolic function was not well  visualized. The right  ventricular size is normal. There is normal pulmonary artery systolic  pressure. The estimated right ventricular systolic pressure is 32.2 mmHg.  3. The mitral valve is normal in structure. No evidence of mitral valve  regurgitation. No evidence of mitral stenosis.  4. The aortic valve is tricuspid. Aortic valve regurgitation is trivial.  Mild aortic valve sclerosis is present, with no evidence of aortic valve  stenosis.  5. Aortic dilatation noted. There is mild dilatation at the level of the  sinuses of Valsalva measuring 43 mm.  6. The inferior vena cava is dilated in size with <50% respiratory  variability, suggesting right atrial pressure of 15 mmHg.   FINDINGS  Left Ventricle: Left ventricular ejection fraction, by estimation, is 55  to 60%. The left ventricle has normal function. Left ventricular  endocardial border not optimally defined to evaluate regional wall motion.  The left ventricular internal cavity  size was normal in size. There is severe left ventricular hypertrophy of  the basal-septal segment.   Right Ventricle: The right ventricular size is normal. No increase in  right ventricular wall thickness. Right ventricular systolic function was  not well visualized. There is normal pulmonary artery systolic pressure.  The tricuspid regurgitant velocity is  1.36 m/s, and with an assumed right atrial pressure of 15 mmHg, the  estimated right ventricular systolic pressure is 02.5 mmHg.   Left Atrium: Left atrial size was normal in size.   Right Atrium: Right atrial size was normal in size.   Pericardium: There is no evidence of pericardial effusion.   Mitral Valve: The mitral valve is normal in structure. Normal mobility of  the mitral valve leaflets. No evidence of mitral valve stenosis.   Tricuspid Valve: The tricuspid valve is normal in structure. Tricuspid  valve regurgitation is trivial. No evidence of tricuspid stenosis.  Aortic  Valve: The aortic valve is tricuspid. Aortic valve regurgitation is  trivial. Mild aortic valve sclerosis is present, with no evidence of  aortic valve stenosis.   Pulmonic Valve: The pulmonic valve was normal in structure. Pulmonic valve  regurgitation is not visualized. No evidence of pulmonic stenosis.   Aorta: Aortic dilatation noted. There is mild dilatation at the level of  the sinuses of Valsalva measuring 43 mm.   Venous: The inferior vena cava is dilated in size with less than 50%  respiratory variability, suggesting right atrial pressure of 15 mmHg.   IAS/Shunts: No atrial level shunt detected by color flow Doppler.    Echo 04/28/19 IMPRESSIONS    1. Difficult to assess LV systolic function given tachycardia and poor  windows, but appears mildly to moderately reduced. Left ventricular  ejection fraction, by estimation, is 40 to 45%. Would consider repeat TTE  with contrast once heart rate better  controlled. There is moderate left ventricular hypertrophy. Left  ventricular diastolic parameters are indeterminate.  2. Right ventricle is poorly visualized, but function appears moderately  reduced  3. The mitral valve is normal in structure. No evidence of mitral valve  regurgitation.  4. The aortic valve is tricuspid. Aortic valve regurgitation is not  visualized. No aortic stenosis is present.  5. Aortic dilatation noted. 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  6. The inferior vena cava is dilated in size with <50% respiratory  variability, suggesting right atrial pressure of 15 mmHg.   FINDINGS  Left Ventricle: Left ventricular ejection fraction, by estimation, is 40  to 45%. The left ventricle has mildly decreased function. The left  ventricle demonstrates global hypokinesis. The left ventricular internal  cavity size was small. There is moderate  left ventricular hypertrophy. Left ventricular diastolic parameters are  indeterminate.    Right Ventricle: The right ventricular size is not well visualized. Right  vetricular wall thickness was not assessed. Right ventricular systolic  function is moderately reduced.   Left Atrium: Left atrial size was not well visualized.   Right Atrium: Right atrial size was not well visualized.   Pericardium: Trivial pericardial effusion is present.   Mitral Valve: The mitral valve is normal in structure. No evidence of  mitral valve regurgitation.   Tricuspid Valve: The tricuspid valve is normal in structure. Tricuspid  valve regurgitation is trivial.   Aortic Valve: The aortic valve is tricuspid. Aortic valve regurgitation is  not visualized. No aortic stenosis is present.   Pulmonic Valve: The pulmonic valve was not well visualized. Pulmonic valve  regurgitation is not visualized.   Aorta: Aortic dilatation noted. 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   Venous: The inferior vena cava is dilated in size with less than 50%  respiratory variability, suggesting right atrial pressure of 15 mmHg.   IAS/Shunts: The interatrial septum was not well visualized.     Patient Profile     68 y.o. male with a hx of ASCAD s/p CABG in 2017, PersistentAF, chronic systolic CHF, DM, Thoracic aortic aneurysm, throat/tongue CA and metastatic colorectal CAseen in consult for RVR in his persistent a fib due to new neoplasm of sigmoid colon  Assessment & Plan    1. Persistentatrial flutter/fibrillation now with RVR  BB  po (outpt he was 100 BID) but start with lower dose due to BP 25mg BID. dig  po 0.125  --  Not on CCB at home would change this last to  30 mg BID vs continue IV with increased HR. Though HR is overall up --  not SOB plan on rate control alone.  --echo limited pending now pt's HR is slower --anticoagulation per Dr. Theodosia Blender note "Dr. [name] and he says that patient is very high risk for ongoing rectal bleeding >>the hospitalist started Eliquis  despite this being stopped in April>>I will discontinue the Eliquis"   2. ASCAD -s/p remote CABG -denies any anginal sx -PO BB -currently not on statin therapy -no ASA due to bleeding issues with colon  3. Chronic systolic CHF -EF 53-74% on echo 04/2019 >> now appears grossly preserved with significant beat to beat variability. -PRN diuresis -now since admit + 1270 though wt is stable since the 9th  4. Malignant neoplasm of sigmoid colon -per oncology -S/P 4 cycles of FOLFOX - colonic obstruction post stentNG tube out - port a cath access          For questions or updates, please contact [place] Please consult [email or web address] for contact info under        Signed, Cecilie Kicks, NP  07/17/2019, 10:21 AM    Patient seen and examined with Cecilie Kicks NP.  Agree as above, with the following exceptions and changes as noted below.  I had requested a repeat echocardiogram given technical difficulties on the last echo quantitating EF.  I have independently reviewed these images.  There is significant beat-to-beat variability in atrial fibrillation, however ejection fraction appears grossly preserved.  Patient intermittently has faster heart rates in atrial fibrillation.  He is eager to go home. Gen: NAD, CV: Irregular rhythm and tachycardic, no murmurs, Lungs: clear, Abd: soft, Extrem: Warm, well perfused, no edema, Neuro/Psych: alert and oriented x 3, normal mood and affect. All available labs, radiology testing, previous records reviewed.  His medical regimen currently includes:  -Digoxin 0.125 mg daily, convert to p.o. tomorrow.  Levels per pharmacy. -Metoprolol tartrate 25 mg twice daily.  Of note this is his home medication, currently at a reduced dose.  With probable grossly preserved LVEF, no urgent indication to transition to Toprol or Coreg. -IV diltiazem.  Again, with grossly preserved LVEF despite significant beat-to-beat variability, it may be safe to use p.o.  diltiazem, would recommend transition to oral diltiazem and wean diltiazem infusion.  Elouise Munroe, MD 07/17/19 10:51 AM

## 2019-07-17 NOTE — Progress Notes (Signed)
Tick found on pt's left chest area near left armpit. Charge RN pulled out. Head of the tick still in pt. MD made aware and came to bedside and successfully removed. Pt tolerated well.

## 2019-07-17 NOTE — TOC Progression Note (Addendum)
Transition of Care (TOC) - Progression Note    Patient Details  Name: Clarence Andrews. MRN: [id number] Date of Birth: 1950/12/08  Transition of Care [facility]) CM/SW Contact  Leeroy Cha, RN Phone Number: 07/17/2019, 8:17 AM  Clinical Narrative:    snf authorization started.  Fl2 sent via the hub to area facilities  information for snf auth faxed to [email or web address].  Expected Discharge Plan: [place] Barriers to Discharge: Continued Medical Work up  Expected Discharge Plan and Services Expected Discharge Plan: [place] Choice: [place] arrangements for the past 2 months: Single Family Home                                       Social Determinants of Health (SDOH) Interventions    Readmission Risk Interventions No flowsheet data found.

## 2019-07-17 NOTE — Evaluation (Signed)
Occupational Therapy Evaluation Patient Details Name: Clarence Andrews. MRN: [id number] DOB: [birth year] Today's Date: 07/17/2019    History of Present Illness 69 y.o. male with a history of CAD s/p CABG, persistent atrial fibrillation/flutter not on anticoagulation secondary to rectal bleeding, chronic systolic heart failure, previous COVID-19 infection, metastatic colon cancer. Patient presented from his oncology office secondary to atrial fibrillation with RVR. While admitted he developed significant nausea and vomiting with CT evidence of obstruction of his sigmoid from is colonic mass   Clinical Impression   Clarence Andrews Is a 69 year old man with complicated medical history, s/p colonic stent placement with Afib RVR and 8 day hospitalization who presents with generalized weakness, decreased activity tolerance and impaired balance resulting in impaired ability to perform baseline mobility and independent ADLs. Patient will benefit from skilled OT services to improve deficits and learn compensatory strategies in order to improve functional abilities. Patient would benefit from short term rehab but is currently adamant about going home at discharge. Patient has no support at home.    Follow Up Recommendations  SNF (Patient adamant about going home.)    Equipment Recommendations   (TBD)    Recommendations for Other Services       Precautions / Restrictions Precautions Precautions: Fall Precaution Comments: monitor HR Restrictions Weight Bearing Restrictions: No      Mobility Bed Mobility Overal bed mobility: Needs Assistance Bed Mobility: Rolling;Sidelying to Sit Rolling: Min assist Sidelying to sit: Mod assist Supine to sit: Mod assist     General bed mobility comments: rolling to perform pericare (BM in bed); assist to sit upright for trunk and for assisting LEs.  Transfers Overall transfer level: Needs assistance Equipment used: Rolling walker (2  wheeled) Transfers: Stand Pivot Transfers;Sit to/from Stand Sit to Stand: +2 safety/equipment;Min assist;+2 physical assistance (Multiple sit to stands performed with varying levels of assistance and use of bed height. Initially min-mod assist x 2 with sit to stands. With last stand min assist for standing and stand pivot to recliner.) Stand pivot transfers: Min assist            Balance Overall balance assessment: Needs assistance Sitting-balance support: Bilateral upper extremity supported;Feet supported Sitting balance-Leahy Scale: Poor   Postural control: Posterior lean Standing balance support: Bilateral upper extremity supported;During functional activity Standing balance-Leahy Scale: Poor Standing balance comment: BUE on walker.                           ADL either performed or assessed with clinical judgement   ADL Overall ADL's : Needs assistance/impaired Eating/Feeding: Set up;Sitting   Grooming: Sitting;Wash/dry hands;Wash/dry face   Upper Body Bathing: Minimal assistance;Sitting   Lower Body Bathing: Maximal assistance;Sit to/from stand;+2 for safety/equipment   Upper Body Dressing : Set up;Sitting   Lower Body Dressing: Maximal assistance;Sit to/from stand Lower Body Dressing Details (indicate cue type and reason): [place] able to donn right sock seated at side of bed - but needed assistance to maintain sitting balance. Therapist assisted with Right sock. Patient unable to maintain standing balance to pull up lower body clothing. Toilet Transfer: Stand-pivot;+2 for safety/equipment;RW;Minimal Print production planner Details (indicate cue type and reason): min assist x 2 for safety for stand pivot transfer (simulated via transfer to recliner) [place] and Hygiene: Total assistance;Sit to/from stand;+2 for safety/equipment     Tub/Shower Transfer Details (indicate cue type and reason): n/a         Vision  Vision  Assessment?: No apparent visual deficits     Perception     Praxis      Pertinent Vitals/Pain Pain Assessment: No/denies pain Faces Pain Scale: Hurts a little bit Pain Location: right knee Pain Descriptors / Indicators: Sore Pain Intervention(s): Monitored during session     Hand Dominance     Extremity/Trunk Assessment     Lower Extremity Assessment Lower Extremity Assessment: Defer to PT evaluation   Cervical / Trunk Assessment Cervical / Trunk Assessment: Normal   Communication Communication Communication: No difficulties   Cognition Arousal/Alertness: Awake/alert Behavior During Therapy: WFL for tasks assessed/performed Overall Cognitive Status: Within Functional Limits for tasks assessed                                     General Comments       Exercises     Shoulder Instructions      Home Living Family/patient expects to be discharged to:: Private residence Living Arrangements: Alone Available Help at Discharge: Friend(s);Available PRN/intermittently Type of Home: Apartment Home Access: Stairs to enter Entrance Stairs-Number of Steps: 4   Home Layout: One level     Bathroom Shower/Tub: Occupational psychologist: Standard     Home Equipment: Environmental consultant - 2 wheels          Prior Functioning/Environment Level of Independence: Independent                 OT Problem List: Decreased strength;Decreased activity tolerance;Impaired balance (sitting and/or standing);Decreased safety awareness      OT Treatment/Interventions: Self-care/ADL training;Therapeutic exercise;DME and/or AE instruction;Therapeutic activities;Patient/family education;Balance training    OT Goals(Current goals can be found in the care plan section) Acute Rehab OT Goals Patient Stated Goal: To go home OT Goal Formulation: With patient Time For Goal Achievement: 07/31/19 Potential to Achieve Goals: Good  OT Frequency: Min 3X/week   Barriers to D/C:  Decreased caregiver support;Inaccessible home environment          Co-evaluation PT/OT/SLP Co-Evaluation/Treatment: Yes Reason for Co-Treatment: For patient/therapist safety;To address functional/ADL transfers PT goals addressed during session: Mobility/safety with mobility OT goals addressed during session: ADL's and self-care      AM-PAC OT "6 Clicks" Daily Activity     Outcome Measure Help from another person eating meals?: A Little Help from another person taking care of personal grooming?: A Little Help from another person toileting, which includes using toliet, bedpan, or urinal?: Total Help from another person bathing (including washing, rinsing, drying)?: A Lot Help from another person to put on and taking off regular upper body clothing?: A Little Help from another person to put on and taking off regular lower body clothing?: Total 6 Click Score: 13   End of Session Equipment Utilized During Treatment: Gait belt;Rolling walker Nurse Communication: Mobility status  Activity Tolerance: Patient tolerated treatment well (HR monitored. HR up to 135 with sitting at edge of bed, standing and transfer.) Patient left:    OT Visit Diagnosis: Unsteadiness on feet (R26.81);Muscle weakness (generalized) (M62.81)                Time: 1100-1130 OT Time Calculation (min): 30 min Charges:  OT General Charges $OT Visit: 1 Visit OT Evaluation $OT Eval Moderate Complexity: 1 Mod  Charleene Callegari, OTR/L [place]  Office [phone] Pager: [place] 07/17/2019, 2:01 PM

## 2019-07-17 NOTE — Progress Notes (Signed)
Physical Therapy Treatment Patient Details Name: Clarence Andrews. MRN: [id number] DOB: [birth year] Today's Date: 07/17/2019    History of Present Illness 69 y.o. male with a history of CAD s/p CABG, persistent atrial fibrillation/flutter not on anticoagulation secondary to rectal bleeding, chronic systolic heart failure, previous COVID-19 infection, metastatic colon cancer. Patient presented from his oncology office secondary to atrial fibrillation with RVR. While admitted he developed significant nausea and vomiting with CT evidence of obstruction of his sigmoid from is colonic mass    PT Comments    The patient required several attempts to stand from bed. Finally stood and took small steps to recliner with mod assist and close guarding of 2 persons. Recommend  SNF, patient stating that he can go home and he will be fine. Patient reports no support from family in the area.  Follow Up Recommendations  SNF     Equipment Recommendations  Wheelchair cushion (measurements PT);Wheelchair (measurements PT);Hospital bed    Recommendations for Other Services       Precautions / Restrictions Precautions Precautions: Fall Precaution Comments: monitor HR Restrictions Weight Bearing Restrictions: No    Mobility  Bed Mobility Overal bed mobility: Needs Assistance Bed Mobility: Rolling;Sidelying to Sit Rolling: Min assist Sidelying to sit: Mod assist Supine to sit: Mod assist     General bed mobility comments: rolling to perform pericare (BM in bed); assist to sit upright for trunk and for assisting LEs.  Transfers Overall transfer level: Needs assistance Equipment used: Rolling walker (2 wheeled) Transfers: Stand Pivot Transfers;Sit to/from Stand Sit to Stand: +2 safety/equipment;Min assist;+2 physical assistance Stand pivot transfers: Mod assist;+2 safety/equipment;+2 physical assistance;From elevated surface       General transfer comment: pt unable to perform sit to stand with  RW, however utilized stedy and pt able to better assist, small steps to recliner, decreased bility to step backwards.  Ambulation/Gait             General Gait Details: pt unable   Stairs             Wheelchair Mobility    Modified Rankin (Stroke Patients Only)       Balance Overall balance assessment: Needs assistance Sitting-balance support: Bilateral upper extremity supported;Feet supported Sitting balance-Leahy Scale: Poor Sitting balance - Comments: UE support required Postural control: Posterior lean Standing balance support: Bilateral upper extremity supported;During functional activity Standing balance-Leahy Scale: Poor Standing balance comment: BUE on walker.                            Cognition Arousal/Alertness: Awake/alert Behavior During Therapy: WFL for tasks assessed/performed Overall Cognitive Status: Within Functional Limits for tasks assessed                                 General Comments: does not acknowledge his limitations for mobility      Exercises      General Comments        Pertinent Vitals/Pain Pain Assessment: No/denies pain Faces Pain Scale: Hurts a little bit Pain Location: right knee Pain Descriptors / Indicators: Sore Pain Intervention(s): Monitored during session    Home Living Family/patient expects to be discharged to:: Private residence Living Arrangements: Alone Available Help at Discharge: Friend(s);Available PRN/intermittently Type of Home: Apartment Home Access: Stairs to enter   Home Layout: One level Home Equipment: Gilford Rile - 2 wheels      Prior  Function Level of Independence: Independent          PT Goals (current goals can now be found in the care plan section) Acute Rehab PT Goals Patient Stated Goal: To go home Progress towards PT goals: Progressing toward goals    Frequency    Min 2X/week      PT Plan Current plan remains appropriate    Co-evaluation  PT/OT/SLP Co-Evaluation/Treatment: Yes Reason for Co-Treatment: For patient/therapist safety;To address functional/ADL transfers PT goals addressed during session: Mobility/safety with mobility OT goals addressed during session: ADL's and self-care      AM-PAC PT "6 Clicks" Mobility   Outcome Measure  Help needed turning from your back to your side while in a flat bed without using bedrails?: A Little Help needed moving from lying on your back to sitting on the side of a flat bed without using bedrails?: A Little Help needed moving to and from a bed to a chair (including a wheelchair)?: Total Help needed standing up from a chair using your arms (e.g., wheelchair or bedside chair)?: Total Help needed to walk in hospital room?: Total Help needed climbing 3-5 steps with a railing? : Total 6 Click Score: 10    End of Session Equipment Utilized During Treatment: Gait belt Activity Tolerance: Patient tolerated treatment well Patient left: in chair;with call bell/phone within reach;with chair alarm set Nurse Communication: Mobility status;Need for lift equipment PT Visit Diagnosis: Other abnormalities of gait and mobility (R26.89);Muscle weakness (generalized) (M62.81)     Time: 0932-6712 PT Time Calculation (min) (ACUTE ONLY): 34 min  Charges:  $Therapeutic Activity: 8-22 mins                     Tresa Endo PT Acute Rehabilitation Services Pager [phone] Office [phone]    Claretha Cooper 07/17/2019, 2:03 PM

## 2019-07-17 NOTE — Progress Notes (Signed)
°  Echocardiogram 2D Echocardiogram limited with definity has been performed.  Darlina Sicilian M 07/17/2019, 8:26 AM

## 2019-07-18 ENCOUNTER — Other Ambulatory Visit: Payer: Self-pay | Admitting: Oncology

## 2019-07-18 ENCOUNTER — Encounter (HOSPITAL_COMMUNITY): Payer: Self-pay | Admitting: Internal Medicine

## 2019-07-18 DIAGNOSIS — C187 Malignant neoplasm of sigmoid colon: Secondary | ICD-10-CM

## 2019-07-18 LAB — GLUCOSE, CAPILLARY
Glucose-Capillary: 171 mg/dL — ABNORMAL HIGH (ref 70–99)
Glucose-Capillary: 237 mg/dL — ABNORMAL HIGH (ref 70–99)
Glucose-Capillary: 231 mg/dL — ABNORMAL HIGH (ref 70–99)
Glucose-Capillary: 201 mg/dL — ABNORMAL HIGH (ref 70–99)

## 2019-07-18 LAB — CBC WITH DIFFERENTIAL/PLATELET
Abs Immature Granulocytes: 0.14 10*3/uL — ABNORMAL HIGH (ref 0.00–0.07)
Basophils Absolute: 0 10*3/uL (ref 0.0–0.1)
Basophils Relative: 0 %
Eosinophils Absolute: 0.3 10*3/uL (ref 0.0–0.5)
Eosinophils Relative: 3 %
HCT: 25.6 % — ABNORMAL LOW (ref 39.0–52.0)
Hemoglobin: 8.1 g/dL — ABNORMAL LOW (ref 13.0–17.0)
Immature Granulocytes: 2 %
Lymphocytes Relative: 9 %
Lymphs Abs: 0.8 10*3/uL (ref 0.7–4.0)
MCH: 28.8 pg (ref 26.0–34.0)
MCHC: 31.6 g/dL (ref 30.0–36.0)
MCV: 91.1 fL (ref 80.0–100.0)
Monocytes Absolute: 1.3 10*3/uL — ABNORMAL HIGH (ref 0.1–1.0)
Monocytes Relative: 15 %
Neutro Abs: 6.2 10*3/uL (ref 1.7–7.7)
Neutrophils Relative %: 71 %
Platelets: 175 10*3/uL (ref 150–400)
RBC: 2.81 MIL/uL — ABNORMAL LOW (ref 4.22–5.81)
RDW: 17.9 % — ABNORMAL HIGH (ref 11.5–15.5)
WBC: 8.8 10*3/uL (ref 4.0–10.5)
nRBC: 0 % (ref 0.0–0.2)

## 2019-07-18 LAB — COMPREHENSIVE METABOLIC PANEL
ALT: 29 U/L (ref 0–44)
AST: 40 U/L (ref 15–41)
Albumin: 2 g/dL — ABNORMAL LOW (ref 3.5–5.0)
Alkaline Phosphatase: 105 U/L (ref 38–126)
Anion gap: 9 (ref 5–15)
BUN: 18 mg/dL (ref 8–23)
CO2: 24 mmol/L (ref 22–32)
Calcium: 7.6 mg/dL — ABNORMAL LOW (ref 8.9–10.3)
Chloride: 99 mmol/L (ref 98–111)
Creatinine, Ser: 1.13 mg/dL (ref 0.61–1.24)
GFR calc Af Amer: >60 mL/min (ref >60)
GFR calc non Af Amer: >60 mL/min (ref >60)
Glucose, Bld: 196 mg/dL — ABNORMAL HIGH (ref 70–99)
Potassium: 3.6 mmol/L (ref 3.5–5.1)
Sodium: 132 mmol/L — ABNORMAL LOW (ref 135–145)
Total Bilirubin: 0.5 mg/dL (ref 0.3–1.2)
Total Protein: 5.7 g/dL — ABNORMAL LOW (ref 6.5–8.1)

## 2019-07-18 LAB — PHOSPHORUS: Phosphorus: 2.3 mg/dL — ABNORMAL LOW (ref 2.5–4.6)

## 2019-07-18 LAB — MAGNESIUM: Magnesium: 1.6 mg/dL — ABNORMAL LOW (ref 1.7–2.4)

## 2019-07-18 LAB — DIGOXIN LEVEL: Digoxin Level: 0.4 ng/mL — ABNORMAL LOW (ref 0.8–2.0)

## 2019-07-18 MED ORDER — DILTIAZEM HCL 30 MG PO TABS
30.0000 mg | ORAL_TABLET | Freq: Once | ORAL | Status: AC
  Administered 2019-07-24: 30 mg via ORAL
  Filled 2019-07-18: qty 1

## 2019-07-18 MED ORDER — DILTIAZEM HCL 30 MG PO TABS
30.0000 mg | ORAL_TABLET | Freq: Three times a day (TID) | ORAL | Status: DC
  Administered 2019-07-18: 30 mg via ORAL
  Filled 2019-07-18: qty 1

## 2019-07-18 MED ORDER — MAGNESIUM SULFATE 2 GM/50ML IV SOLN
2.0000 g | Freq: Once | INTRAVENOUS | Status: AC
  Administered 2019-07-18: 2 g via INTRAVENOUS
  Filled 2019-07-18: qty 50

## 2019-07-18 MED ORDER — METOPROLOL TARTRATE 25 MG PO TABS
25.0000 mg | ORAL_TABLET | ORAL | Status: DC
  Administered 2019-07-18 – 2019-07-19 (×3): 25 mg via ORAL
  Filled 2019-07-18 (×3): qty 1

## 2019-07-18 MED ORDER — DILTIAZEM HCL 60 MG PO TABS: 60.0000 mg | ORAL_TABLET | Freq: Three times a day (TID) | ORAL | Status: DC

## 2019-07-18 NOTE — Progress Notes (Signed)
**Note De-Identified vi Obfusction** PROGRESS NOTE    Newmn Nip.  WNI:[id number] DOB: 22-Jul-1950 DOA: 07/09/2019 PCP: Ptient, No Pcp Per   Chief Complint  Ptient presents with  . [place]   Brief Nrrtive: Clarence Andrews. is  69 y.o. mle with  history of CAD s/p CABG, persistent tril fibrilltion/flutter not on nticogultion secondry to rectl bleeding, chronic systolic hert filure, previous COVID-19 infection, metsttic colon cncer. Ptient presented from his oncology office secondry to tril fibrilltion with RVR. While dmitted he developed significnt nuse nd vomiting with CT evidence of obstruction of his sigmoid from is colonic mss.  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  Metoprolol 25 mg PO, incresed to TID Diltizem 30 q8 hrs Digoxin 0.125 mg dily Repet echo with EF 55-60%, severe LVH of bsl septl segment (see report) Crdiology c/s, pprecite recs Not on nticogultion due to risk of rectl bleeding   Lrge bowel obstruction Ptient with no bowel movement in 5 dys prior to dmission. No prior history of obstruction but hs hd bdominl surgery in the pst. History of denocrcinom of the colon s mentioned below. Abdominl x-ry (6/8) significnt for possible ileus. CT bdomen/pelvis (6/9) obtined nd significnt for severe/obstructing mlignnt stricture t sigmoid colon. Generl surgery nd GI consulted on 6/9. Ptient felt better fter NG tube plcement. Hving bowel movements fter colonic stent plced which ws  significnt for completely obstructing colonic mss. -GI recommendtions: dvnce diet s ble; stent plced 6/11 -Generl surgery recommendtions pending. 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 Currently follows with medicl oncology, Dr. Beny Spice nd is on ctive chemotherpy tretment.  Chronic systolic hert filure EF of 40-45%. Ptient is euvolemic on dmission. On metoprolol (Lopressor) s n outptient. Ptient is not on diuretic therpy. Rles noted on exm but chest x-ry without evidence of effusion or edem.  - 6/16 EF 55-60%, severe LVH (see report) -Wtch fluid sttus; dily weights nd strict in/out  History of CABG CAD Ptient ws previously on spirin which ws discontinued secondry to rectl bleeding.  Dibetes mellitus, type 2 Ptient is on glipizide s n outptient. Lst hemoglobin A1C of 7.8%.  -Continue SSI  AKI on  CKD stge II Bseline cretinine of 1.2. Pek cretinine of 1.5 which is now bck to bseline with IV fluids. Resolved.  Tick Bite: tick found on L chest 6/16, removed with tweezer.  Follow for rsh.  Given 200 mg doxycycline x1 for ppx.  Therpy recommending SNF.  Pt resistnt to this ide, but I think it's unsfe for dischrge home s he hs no support t home.  Now seems more greeble to d/c to SNF.  C/s TOC tem for ssistnce.  DVT prophylxis: SCD Code Sttus: full  Fmily Communiction: none t bedside Disposition:   Sttus is: Inptient  Remins inptient pproprite becuse:Inptient level of cre pproprite due to severity of illness   Dispo: The ptient is from: Home              Anticipted  d/c is to: pending              Anticipated d/c date is: 3 days              Patient currently is not medically stable to d/c.   Consultants:   Cardiology  Procedures:  Echo IMPRESSIONS    1. Left  ventricular ejection fraction, by estimation, is 55-60%. The left  ventricle has normal function. Left ventricular endocardial border not  optimally defined to evaluate regional wall motion even with definity  contrast. There is severe left  ventricular hypertrophy of the basal-septal segment. Left ventricular  diastolic parameters are indeterminate.  2. Right ventricular systolic function was not well visualized. The right  ventricular size is normal. There is normal pulmonary artery systolic  pressure. The estimated right ventricular systolic pressure is 16.0 mmHg.  3. The mitral valve is normal in structure. No evidence of mitral valve  regurgitation. No evidence of mitral stenosis.  4. The aortic valve is tricuspid. Aortic valve regurgitation is trivial.  Mild aortic valve sclerosis is present, with no evidence of aortic valve  stenosis.  5. Aortic dilatation noted. There is mild dilatation at the level of the  sinuses of Valsalva measuring 43 mm.  6. The inferior vena cava is dilated in size with <50% respiratory  variability, suggesting right atrial pressure of 15 mmHg.   Impression - Preparation of the colon was fair but inadequate for appropriate screening purposes but not for diagnostic/therapeutic intention.. - Hemorrhoids found on digital rectal exam. - Malignant completely obstructing tumor from 17 to 20 cm proximal to the anus. - Dilated in the sigmoid colon proximal to the obstructing colonic mass. - Wire placed beyond the stenosis, area injected with radiographic contrast to delineate stenosis further. Prosthesis placed. Recommendations - The patient will be observed post-procedure, until all discharge criteria are met. - Patient to the PACU for recovery. - Return patient to hospital ward for ongoing care thereafter. - Observe clinical status. - May trial sips of clear liquids the rest of today but maintain NGT in place but can try to cap for now. - If patient  tolerates the sips and KUB tomorrow (already ordered) looks to have enteral stent still in good position, then would allow clear liquids to advance to full liquids throughout Saturday. On _0 0 mg Oral  Once 07/17/19 1419 07/17/19 1637     Subjective: No new complaints  Objective: Vitals:   07/18/19 0600 07/18/19 0800 07/18/19 0922 07/18/19 1300  BP: 120/71 (!) 113/57 137/69   Pulse: (!) 117 65 72   Resp: 15 15    Temp:  98.1  F (36.7 C)  98.9 F (37.2 C)  TempSrc:  Oral  Oral  SpO2: 99% 100%    Weight:      Height:        Intake/Output Summary (Last 24 hours) at 07/18/2019 1343 Last data filed at 07/18/2019 0825 Gross per 24 hour  Intake 96.43 ml  Output 1200 ml  Net -1103.57 ml   Filed Weights   07/16/19 0500 07/17/19 0500 07/18/19 0500  Weight: 99.3 kg 99.3 kg 99.3 kg    Examination:  General: No acute distress. Cardiovascular: Heart sounds show Disney Ruggiero regular rate, and rhythm Lungs: Clear to auscultation bilaterally  Abdomen: Soft, nontender, nondistended  Neurological: Alert and oriented 3. Moves all extremities  4. Cranial nerves II through XII grossly intact. Skin: Warm and dry. No rashes or lesions. Extremities: No clubbing or cyanosis. No edema.   Data Reviewed: I have personally reviewed following labs and imaging studies  CBC: Recent Labs  Lab 07/13/19 0950 07/14/19 0527 07/16/19 0548 07/17/19 0500 07/18/19 0603  WBC 12.8* 11.5* 14.0* 10.7* 8.8  NEUTROABS  --   --   --   --  6.2  HGB 9.8* 8.7* 8.1* 7.9* 8.1*  HCT 31.5* 27.1* 25.3* 25.1* 25.6*  MCV 92.6 91.2 90.4 92.3 91.1  PLT 224 188 171 173 102    Basic Metabolic Panel: Recent Labs  Lab 07/14/19 0527 07/15/19 1040 07/16/19 0548 07/17/19 0500 07/18/19 0603  NA 135 133* 132* 133* 132*  K 3.5 3.8 3.4* 3.7 3.6  CL 101 100 100 101 99  CO2 _0 GLUCOSE 221* 245* 144* 184* 196*  BUN _1 CREATININE 1.04 1.15 1.01 1.08 1.13  CALCIUM 7.8* 7.7* 7.6* 8.0* 7.6*  MG 1.4* 1.5* 1.3* 1.6* 1.6*  PHOS  --   --   --   --  2.3*    GFR: Estimated Creatinine Clearance: 75.5 mL/min (by C-G formula based on SCr of 1.13 mg/dL).  Liver Function Tests: Recent Labs  Lab 07/12/19 0504 07/18/19 0603  AST 15 40  ALT 11 29  ALKPHOS 112 105  BILITOT 0.7 0.5  PROT 5.6* 5.7*  ALBUMIN 2.7* 2.0*    CBG: Recent Labs  Lab 07/17/19 1143 07/17/19 1621 07/17/19 2138 07/18/19 0758 07/18/19 1227  GLUCAP 201* 174* 169* 171* 237*     Recent Results (from the past 240 hour(s))  SARS Coronavirus 2 by RT PCR (hospital order, performed in [facility] hospital lab) Nasopharyngeal Nasopharyngeal Swab     Status: None   Collection Time: 07/09/19 12:44 PM   Specimen: Nasopharyngeal Swab  Result Value Ref Range Status   SARS Coronavirus 2 NEGATIVE NEGATIVE Final    Comment: (NOTE) SARS-CoV-2 target nucleic acids are NOT DETECTED. The SARS-CoV-2 RNA is generally detectable in upper and lower respiratory specimens during the acute phase of infection. The lowest concentration of SARS-CoV-2 viral copies this assay can  detect is 250 copies / mL. Hiep Ollis negative result does not preclude SARS-CoV-2 infection and should not be used as the sole basis for treatment or other patient management decisions.  Jamarie Mussa negative result may occur with improper specimen collection / handling, submission of specimen other than nasopharyngeal swab, presence of viral mutation(s) within the areas targeted by this assay, and inadequate number of viral copies (<250 copies / mL). Regana Kemple negative result must be combined with clinical observations, patient history, and epidemiological information. Fact Sheet for Patients:   StrictlyIdeas.no Fact Sheet for Healthcare Providers: **Note De-Identified vi Obfution** BnkingDelers.co.z This test is not yet pproved or clered  by the Prguy nd hs been uthorized for detection nd/or dignosis of SRS-CoV-2 by FD under n Emergency Use uthoriztion (EU).  This EU will remin in effect (mening this test cn be used) for the durtion of the COVID-19 declrtion under Section 564(b)(1) of the [place], 21 U.S.C. section 360bbb-3(b)(1), unless the uthoriztion is terminted or revoked sooner. Performed t [facility], [place] [address], [place], [place] [address]   MRS PCR Screening     Sttus: None   Collection Time: 07/09/19  7:53 PM   Specimen: Nsl Mucos; Nsophryngel  Result Vlue Ref Rnge Sttus   MRS by PCR NEGTIVE NEGTIVE Finl    Comment:        The GeneXpert MRS ssy (FD pproved for NSL specimens only), is one component of  comprehensive MRS coloniztion surveillnce progrm. It is not intended to dignose MRS infection nor to guide or monitor tretment for MRS infections. Performed t [facility], [place] [address], [place], [place] [address]          Rdiology Studies: ECHOCRDIOGRM LIMITED  Result Dte: 07/17/2019    ECHOCRDIOGRM LIMITED REPORT   Ptient Nme:   Clarence Andrews. Dte of  Exm: 07/17/2019 Medicl Rec #:  [id number]          Height:       74.0 in ccession #:    [id number]         Weight:       218.9 lb Dte of Birth:  Jn 06, 1952           BS:          2.259 m Ptient ge:    39 yers           BP:           117/54 mmHg Ptient Gender: M                  HR:           98 bpm. Exm Loction:  Inptient Procedure: Limited Echo, Intrcrdic Opcifiction gent, Color Doppler nd            Crdic Doppler Indictions:    Congestive Hert Filure 428.0 / I50.9  History:        Ptient hs prior history of Echocrdiogrm exmintions, most                 recent 04/28/2019. CD, Prior CBG; Risk Fctors:Dibetes.                 Mlignnt neoplsm of sigmoid colon, history of throt cncer.  Sonogrpher:    Drlin Sicilin RDCS Referring Phys: [id number] Nden Corwin  [facility]  1. Left ventriculr ejection frction, by estimtion, is 55-60%. The left ventricle hs norml function. Left ventriculr endocrdil border not optimlly defined to evlute regionl wll motion even with definity contrst. There is severe left ventriculr hypertrophy of the bsl-septl segment. Left ventriculr distolic prmeters re indeterminte.  2. Right ventriculr systolic function ws not well visulized. The right ventriculr size is norml. There is norml pulmonry rtery systolic pressure. The estimted right ventriculr systolic pressure is 19.6 mmHg.  3. The mitrl vlve is norml in structure. No evidence of mitrl vlve regurgittion. No evidence of mitrl stenosis.  4. The ortic vlve is tricuspid. ortic vlve regurgittion is trivil. Mild ortic vlve lerosis is present, with no evidence of ortic vlve stenosis.  5.  Aortic dilatation noted. There is mild dilatation at the level of the sinuses of Valsalva measuring 43 mm.  6. The inferior vena cava is dilated in size with <50% respiratory variability, suggesting right atrial pressure of 15 mmHg. FINDINGS  Left Ventricle: Left ventricular  ejection fraction, by estimation, is 55 to 60%. The left ventricle has normal function. Left ventricular endocardial border not optimally defined to evaluate regional wall motion. The left ventricular internal cavity size was normal in size. There is severe left ventricular hypertrophy of the basal-septal segment. Right Ventricle: The right ventricular size is normal. No increase in right ventricular wall thickness. Right ventricular systolic function was not well visualized. There is normal pulmonary artery systolic pressure. The tricuspid regurgitant velocity is  1.36 m/s, and with an assumed right atrial pressure of 15 mmHg, the estimated right ventricular systolic pressure is 24.8 mmHg. Left Atrium: Left atrial size was normal in size. Right Atrium: Right atrial size was normal in size. Pericardium: There is no evidence of pericardial effusion. Mitral Valve: The mitral valve is normal in structure. Normal mobility of the mitral valve leaflets. No evidence of mitral valve stenosis. Tricuspid Valve: The tricuspid valve is normal in structure. Tricuspid valve regurgitation is trivial. No evidence of tricuspid stenosis. Aortic Valve: The aortic valve is tricuspid. Aortic valve regurgitation is trivial. Mild aortic valve sclerosis is present, with no evidence of aortic valve stenosis. Pulmonic Valve: The pulmonic valve was normal in structure. Pulmonic valve regurgitation is not visualized. No evidence of pulmonic stenosis. Aorta: Aortic dilatation noted. There is mild dilatation at the level of the sinuses of Valsalva measuring 43 mm. Venous: The inferior vena cava is dilated in size with less than 50% respiratory variability, suggesting right atrial pressure of 15 mmHg. IAS/Shunts: No atrial level shunt detected by color flow Doppler.  LEFT VENTRICLE PLAX 2D LVIDd:         4.30 cm LVIDs:         3.55 cm LV PW:         1.05 cm LV IVS:        1.80 cm LVOT diam:     2.20 cm LV SV:         48 LV SV Index:   21 LVOT  Area:     3.80 cm  LV Volumes (MOD) LV vol d, MOD A2C: 82.9 ml LV vol d, MOD A4C: 173.0 ml LV vol s, MOD A2C: 56.7 ml LV vol s, MOD A4C: 88.6 ml LV SV MOD A2C:     26.2 ml LV SV MOD A4C:     173.0 ml LV SV MOD BP:      51.9 ml LEFT ATRIUM         Index LA diam:    2.40 cm 1.06 cm/m  AORTIC VALVE LVOT Vmax:   75.00 cm/s LVOT Vmean:  57.000 cm/s LVOT VTI:    0.126 m  AORTA Ao Root diam: 4.30 cm MITRAL VALVE               TRICUSPID VALVE MV Area (PHT): 4.96 cm    TR Peak grad:   7.4 mmHg MV Decel Time: 153 msec    TR Vmax:        136.00 cm/s MV E velocity: 95.20 cm/s                            SHUNTS  Systemic VTI:  0.13 m                            Systemic Diam: 2.20 cm Fransico Him MD Electronically signed by Fransico Him MD Signature Date/Time: 07/17/2019/10:43:14 AM    Final         Scheduled Meds: . Chlorhexidine Gluconate Cloth  6 each Topical Q0600  . digoxin  0.125 mg Oral Daily  . diltiazem  30 mg Oral Once  . diltiazem  30 mg Oral Q8H  . docusate sodium  200 mg Oral BID  . insulin aspart  0-15 Units Subcutaneous TID WC  . insulin aspart  0-5 Units Subcutaneous QHS  . metoprolol tartrate  25 mg Oral [id number]  . pantoprazole (PROTONIX) IV  40 mg Intravenous QHS  . sodium chloride flush  10-40 mL Intracatheter Q12H   Continuous Infusions:    LOS: 9 days    Time spent: over 30 min    Fayrene Helper, MD [facility]   To contact the attending provider between 7A-7P or the covering provider during after hours 7P-7A, please log into the web site [email or web address] and access using universal [place] password for that web site. If you do not have the password, please call the hospital operator.  07/18/2019, 1:43 PM

## 2019-07-18 NOTE — Progress Notes (Addendum)
Progress Note  Patient Name: Clarence Andrews. Date of Encounter: 07/18/2019  [facility] Cardiologist: Mertie Moores, MD   Subjective   No chest pain or SOB. Is aware that he is very weak. Has a neighbor that checks on him.  Concerned about going home because he needs to pay bills, etc. Does not have anyone else that can do that for him.   Inpatient Medications    Scheduled Meds:  Chlorhexidine Gluconate Cloth  6 each Topical Q0600   digoxin  0.125 mg Oral Daily   diltiazem  30 mg Oral Q8H   docusate sodium  200 mg Oral BID   insulin aspart  0-15 Units Subcutaneous TID WC   insulin aspart  0-5 Units Subcutaneous QHS   metoprolol tartrate  25 mg Oral BID   pantoprazole (PROTONIX) IV  40 mg Intravenous QHS   sodium chloride flush  10-40 mL Intracatheter Q12H   Continuous Infusions:  diltiazem (CARDIZEM) infusion 5 mg/hr (07/17/19 1800)   PRN Meds: alum & mag hydroxide-simeth, fentaNYL (SUBLIMAZE) injection, levalbuterol, liver oil-zinc oxide, ondansetron **OR** ondansetron (ZOFRAN) IV, phenol, polyethylene glycol, sodium chloride flush   Vital Signs    Vitals:   07/18/19 0400 07/18/19 0500 07/18/19 0556 07/18/19 0600  BP: 138/60 124/65 125/65 120/71  Pulse: (!) 109 100  (!) 117  Resp: (!) 22 (!) 21  15  Temp:      TempSrc:      SpO2: 99% 100%  99%  Weight:  99.3 kg    Height:        Intake/Output Summary (Last 24 hours) at 07/18/2019 0754 Last data filed at 07/18/2019 0600 Gross per 24 hour  Intake 183.64 ml  Output 1200 ml  Net -1016.36 ml   Last 3 Weights 07/18/2019 07/17/2019 07/16/2019  Weight (lbs) 218 lb 14.7 oz 218 lb 14.7 oz 218 lb 14.7 oz  Weight (kg) 99.3 kg 99.3 kg 99.3 kg      Telemetry    Aflutter, rate higher overnight, but now 3:1 conduction  - Personally Reviewed  ECG    No new - Personally Reviewed  Physical Exam   GEN: No acute distress.   Neck: No JVD Cardiac: mostly regular R&R, no murmur, no rubs, or gallops.   Respiratory: diminished to auscultation bilaterally with a few rales in the bases. GI: Soft, nontender, non-distended  MS: No edema; No deformity. Neuro:  Nonfocal  Psych: Normal affect w/ some anxiety  Labs    High Sensitivity Troponin:  No results for input(s): TROPONINIHS in the last 720 hours.    Chemistry Recent Labs  Lab 07/12/19 0504 07/14/19 0527 07/16/19 0548 07/17/19 0500 07/18/19 0603  NA 138   < > 132* 133* 132*  K 4.0   < > 3.4* 3.7 3.6  CL 102   < > 100 101 99  CO2 27   < > 23 25 24   GLUCOSE 92   < > 144* 184* 196*  BUN 36*   < > 17 19 18   CREATININE 1.17   < > 1.01 1.08 1.13  CALCIUM 8.1*   < > 7.6* 8.0* 7.6*  PROT 5.6*  --   --   --  5.7*  ALBUMIN 2.7*  --   --   --  2.0*  AST 15  --   --   --  40  ALT 11  --   --   --  29  ALKPHOS 112  --   --   --  105  BILITOT 0.7  --   --   --  0.5  GFRNONAA >60   < > >60 >60 >60  GFRAA >60   < > >60 >60 >60  ANIONGAP 9   < > 9 7 9    < > = values in this interval not displayed.     Hematology Recent Labs  Lab 07/16/19 0548 07/17/19 0500 07/18/19 0603  WBC 14.0* 10.7* 8.8  RBC 2.80* 2.72* 2.81*  HGB 8.1* 7.9* 8.1*  HCT 25.3* 25.1* 25.6*  MCV 90.4 92.3 91.1  MCH 28.9 29.0 28.8  MCHC 32.0 31.5 31.6  RDW 18.4* 18.3* 17.9*  PLT 171 173 175    BNPNo results for input(s): BNP, PROBNP in the last 168 hours.   Lab Results  Component Value Date   TSH 1.684 07/09/2019   Radiology    ECHOCARDIOGRAM LIMITED  Result Date: 07/17/2019    ECHOCARDIOGRAM LIMITED REPORT   Patient Name:   Alfonzia Woolum. Date of Exam: 07/17/2019 Medical Rec #:  [id number]          Height:       74.0 in Accession #:    [id number]         Weight:       218.9 lb Date of Birth:  24-Mar-1950           BSA:          2.259 m Patient Age:    69 years           BP:           117/54 mmHg Patient Gender: M                  HR:           98 bpm. Exam Location:  Inpatient Procedure: Limited Echo, Intracardiac Opacification Agent, Color Doppler and             Cardiac Doppler Indications:    Congestive Heart Failure 428.0 / I50.9  History:        Patient has prior history of Echocardiogram examinations, most                 recent 04/28/2019. CAD, Prior CABG; Risk Factors:Diabetes.                 Malignant neoplasm of sigmoid colon, history of throat cancer.  Sonographer:    Darlina Sicilian RDCS Referring Phys: [id number] Nadean Corwin A [place]  1. Left ventricular ejection fraction, by estimation, is 55-60%. The left ventricle has normal function. Left ventricular endocardial border not optimally defined to evaluate regional wall motion even with definity contrast. There is severe left ventricular hypertrophy of the basal-septal segment. Left ventricular diastolic parameters are indeterminate.  2. Right ventricular systolic function was not well visualized. The right ventricular size is normal. There is normal pulmonary artery systolic pressure. The estimated right ventricular systolic pressure is 39.7 mmHg.  3. The mitral valve is normal in structure. No evidence of mitral valve regurgitation. No evidence of mitral stenosis.  4. The aortic valve is tricuspid. Aortic valve regurgitation is trivial. Mild aortic valve sclerosis is present, with no evidence of aortic valve stenosis.  5. Aortic dilatation noted. There is mild dilatation at the level of the sinuses of Valsalva measuring 43 mm.  6. The inferior vena cava is dilated in size with <50% respiratory variability, suggesting right atrial pressure of 15 mmHg. FINDINGS  Left Ventricle: Left ventricular ejection fraction,  by estimation, is 55 to 60%. The left ventricle has normal function. Left ventricular endocardial border not optimally defined to evaluate regional wall motion. The left ventricular internal cavity size was normal in size. There is severe left ventricular hypertrophy of the basal-septal segment. Right Ventricle: The right ventricular size is normal. No increase in right ventricular wall  thickness. Right ventricular systolic function was not well visualized. There is normal pulmonary artery systolic pressure. The tricuspid regurgitant velocity is  1.36 m/s, and with an assumed right atrial pressure of 15 mmHg, the estimated right ventricular systolic pressure is 05.6 mmHg. Left Atrium: Left atrial size was normal in size. Right Atrium: Right atrial size was normal in size. Pericardium: There is no evidence of pericardial effusion. Mitral Valve: The mitral valve is normal in structure. Normal mobility of the mitral valve leaflets. No evidence of mitral valve stenosis. Tricuspid Valve: The tricuspid valve is normal in structure. Tricuspid valve regurgitation is trivial. No evidence of tricuspid stenosis. Aortic Valve: The aortic valve is tricuspid. Aortic valve regurgitation is trivial. Mild aortic valve sclerosis is present, with no evidence of aortic valve stenosis. Pulmonic Valve: The pulmonic valve was normal in structure. Pulmonic valve regurgitation is not visualized. No evidence of pulmonic stenosis. Aorta: Aortic dilatation noted. There is mild dilatation at the level of the sinuses of Valsalva measuring 43 mm. Venous: The inferior vena cava is dilated in size with less than 50% respiratory variability, suggesting right atrial pressure of 15 mmHg. IAS/Shunts: No atrial level shunt detected by color flow Doppler.  LEFT VENTRICLE PLAX 2D LVIDd:         4.30 cm LVIDs:         3.55 cm LV PW:         1.05 cm LV IVS:        1.80 cm LVOT diam:     2.20 cm LV SV:         48 LV SV Index:   21 LVOT Area:     3.80 cm  LV Volumes (MOD) LV vol d, MOD A2C: 82.9 ml LV vol d, MOD A4C: 173.0 ml LV vol s, MOD A2C: 56.7 ml LV vol s, MOD A4C: 88.6 ml LV SV MOD A2C:     26.2 ml LV SV MOD A4C:     173.0 ml LV SV MOD BP:      51.9 ml LEFT ATRIUM         Index LA diam:    2.40 cm 1.06 cm/m  AORTIC VALVE LVOT Vmax:   75.00 cm/s LVOT Vmean:  57.000 cm/s LVOT VTI:    0.126 m  AORTA Ao Root diam: 4.30 cm MITRAL VALVE                TRICUSPID VALVE MV Area (PHT): 4.96 cm    TR Peak grad:   7.4 mmHg MV Decel Time: 153 msec    TR Vmax:        136.00 cm/s MV E velocity: 95.20 cm/s                            SHUNTS                            Systemic VTI:  0.13 m  Systemic Diam: 2.20 cm Fransico Him MD Electronically signed by Fransico Him MD Signature Date/Time: 07/17/2019/10:43:14 AM    Final     Cardiac Studies   LIMITED ECHO 07/17/19 IMPRESSIONS    1. Left ventricular ejection fraction, by estimation, is 55-60%. The left  ventricle has normal function. Left ventricular endocardial border not  optimally defined to evaluate regional wall motion even with definity  contrast. There is severe left ventricular hypertrophy of the basal-septal segment. Left ventricular diastolic parameters are indeterminate.  2. Right ventricular systolic function was not well visualized. The right  ventricular size is normal. There is normal pulmonary artery systolic  pressure. The estimated right ventricular systolic pressure is 40.1 mmHg.  3. The mitral valve is normal in structure. No evidence of mitral valve  regurgitation. No evidence of mitral stenosis.  4. The aortic valve is tricuspid. Aortic valve regurgitation is trivial.  Mild aortic valve sclerosis is present, with no evidence of aortic valve  stenosis.  5. Aortic dilatation noted. There is mild dilatation at the level of the  sinuses of Valsalva measuring 43 mm.  6. The inferior vena cava is dilated in size with <50% respiratory  variability, suggesting right atrial pressure of 15 mmHg.   FINDINGS  Left Ventricle: Left ventricular ejection fraction, by estimation, is 55  to 60%. The left ventricle has normal function. Left ventricular  endocardial border not optimally defined to evaluate regional wall motion.  The left ventricular internal cavity  size was normal in size. There is severe left ventricular hypertrophy of  the  basal-septal segment.   Right Ventricle: The right ventricular size is normal. No increase in  right ventricular wall thickness. Right ventricular systolic function was  not well visualized. There is normal pulmonary artery systolic pressure.  The tricuspid regurgitant velocity is  1.36 m/s, and with an assumed right atrial pressure of 15 mmHg, the  estimated right ventricular systolic pressure is 02.7 mmHg.   Left Atrium: Left atrial size was normal in size.   Right Atrium: Right atrial size was normal in size.   Pericardium: There is no evidence of pericardial effusion.   Mitral Valve: The mitral valve is normal in structure. Normal mobility of  the mitral valve leaflets. No evidence of mitral valve stenosis.   Tricuspid Valve: The tricuspid valve is normal in structure. Tricuspid  valve regurgitation is trivial. No evidence of tricuspid stenosis.   Aortic Valve: The aortic valve is tricuspid. Aortic valve regurgitation is  trivial. Mild aortic valve sclerosis is present, with no evidence of  aortic valve stenosis.   Pulmonic Valve: The pulmonic valve was normal in structure. Pulmonic valve  regurgitation is not visualized. No evidence of pulmonic stenosis.   Aorta: Aortic dilatation noted. There is mild dilatation at the level of  the sinuses of Valsalva measuring 43 mm.   Venous: The inferior vena cava is dilated in size with less than 50%  respiratory variability, suggesting right atrial pressure of 15 mmHg.   IAS/Shunts: No atrial level shunt detected by color flow Doppler.    Echo 04/28/19 IMPRESSIONS  1. Difficult to assess LV systolic function given tachycardia and poor  windows, but appears mildly to moderately reduced. Left ventricular  ejection fraction, by estimation, is 40 to 45%. Would consider repeat TTE  with contrast once heart rate better  controlled. There is moderate left ventricular hypertrophy. Left  ventricular diastolic parameters are  indeterminate.  2. Right ventricle is poorly visualized, but function appears moderately  reduced  3. The mitral valve is normal in structure. No evidence of mitral valve  regurgitation.  4. The aortic valve is tricuspid. Aortic valve regurgitation is not  visualized. No aortic stenosis is present.  5. Aortic dilatation noted. 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  6. The inferior vena cava is dilated in size with <50% respiratory  variability, suggesting right atrial pressure of 15 mmHg.   FINDINGS  Left Ventricle: Left ventricular ejection fraction, by estimation, is 40  to 45%. The left ventricle has mildly decreased function. The left  ventricle demonstrates global hypokinesis. The left ventricular internal  cavity size was small. There is moderate  left ventricular hypertrophy. Left ventricular diastolic parameters are  indeterminate.   Right Ventricle: The right ventricular size is not well visualized. Right  vetricular wall thickness was not assessed. Right ventricular systolic  function is moderately reduced.   Left Atrium: Left atrial size was not well visualized.   Right Atrium: Right atrial size was not well visualized.   Pericardium: Trivial pericardial effusion is present.   Mitral Valve: The mitral valve is normal in structure. No evidence of  mitral valve regurgitation.   Tricuspid Valve: The tricuspid valve is normal in structure. Tricuspid  valve regurgitation is trivial.   Aortic Valve: The aortic valve is tricuspid. Aortic valve regurgitation is  not visualized. No aortic stenosis is present.   Pulmonic Valve: The pulmonic valve was not well visualized. Pulmonic valve  regurgitation is not visualized.   Aorta: Aortic dilatation noted. 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   Venous: The inferior vena cava is dilated in size with less than 50%  respiratory variability, suggesting right atrial pressure  of 15 mmHg.   IAS/Shunts: The interatrial septum was not well visualized.     Patient Profile     70 y.o. male with a hx of ASCAD s/p CABG in 2017, PersistentAF, chronic systolic CHF, DM, Thoracic aortic aneurysm, throat/tongue CA and metastatic colorectal CAseen in consult for RVR in his persistent a fib due to new neoplasm of sigmoid colon  Assessment & Plan    1. Persistentatrial flutter/fibrillation now with RVR -  SBP 113 now, but was 120s-130s overnight. - got Dilt 30 mg po this am, still on gtt at 5 mg/hr - will give one-time dose 30 mg Dilt, then d/c gtt - on metoprolol 25 mg bid, will increase to tid and see how tolerated - continue Digoxin 0.125 mg qd  - on April 2021, pt on Lopressor 100 mg bid and Dilt CD 120 mg qd, Dilt was d/c'd because pt HR low 40s, had some higher HR and Dilt restarted at 30 mg bid - EF nl on limited echo - CHA2DS2-VASc = 4 (age x 1, CHF, DM, CAD) - however, Dr [name] says pt at high risk for rectal bleeding - therefore, risk>benefit, no anticoag  2. ASCAD - hx CABG -no ischemic sx - on BB - no ASA due to bleeding issues - not on statin, address as outp  3. Chronic systolic CHF - EF prev 85-27%, now normalized - I/O net +374 ml, follow  4. Malignant neoplasm of sigmoid colon - s/p 4 cycles FOLFOX followed by severe neutropenia, COVID, C diff  - per Dr [name]      For questions or updates, please contact [facility] Please consult [email or web address] for contact info under        Signed, Rosaria Ferries, PA-C  07/18/2019, 7:54 AM    Patient seen and examined with R. Barrett PA-C.  Agree as above, with the following exceptions and changes as noted below. Patient is overall well, no CP or SOB.. Gen: NAD, CV: iRRR, no murmurs, Lungs: clear, Abd: soft, Extrem: Warm, well perfused, no edema, Neuro/Psych: alert and oriented x 3, normal mood and affect. All available labs, radiology testing, previous records reviewed. Agree with  medication adjustments as noted above. Watch closely for bradycardia with diltiazem. No AC per oncology.  Elouise Munroe, MD 07/18/19

## 2019-07-18 NOTE — Progress Notes (Signed)
Pt had an episode of bradycardia, HR 48-50, appeared to be afib. Patient asymptomatic, MD Florene Glen and cardiology made aware. Unable to obtain EKG as HR went back to normal.

## 2019-07-18 NOTE — TOC Progression Note (Signed)
Transition of Care (TOC) - Progression Note    Patient Details  Name: Clarence Andrews. MRN: [id number] Date of Birth: 1950/08/11  Transition of Care [facility]) CM/SW Contact  Leeroy Cha, RN Phone Number: 07/18/2019, 11:40 AM  Clinical Narrative:    [facility] Preferred SNF Details         [facility] Details       [facility] Preferred [facility] Details       [facility]       Accepted snf for patients   Expected Discharge Plan: Skilled Nursing Facility Barriers to Discharge: Continued Medical Work up  Expected Discharge Plan and Services Expected Discharge Plan: [place] Choice: [address] arrangements for the past 2 months: Single Family Home                                       Social Determinants of Health (SDOH) Interventions    Readmission Risk Interventions No flowsheet data found.

## 2019-07-18 NOTE — Progress Notes (Signed)
Pharmacist Chemotherapy Monitoring - Follow Up Assessment    I verify that I have reviewed each item in the below checklist:   Regimen for the patient is scheduled for the appropriate day and plan matches scheduled date.  Appropriate non-routine labs are ordered dependent on drug ordered.  If applicable, additional medications reviewed and ordered per protocol based on lifetime cumulative doses and/or treatment regimen.   Plan for follow-up and/or issues identified: Yes  I-vent associated with next due treatment: Yes  MD and/or nursing notified: No  Kennith Center, Pharm.D., CPP 07/18/2019@2 :56 PM

## 2019-07-18 NOTE — Progress Notes (Signed)
HEMATOLOGY-ONCOLOGY PROGRESS NOTE  SUBJECTIVE: [name] underwent placement of a colon stent on 07/12/2019.  He is having bowel movements.  He denies bleeding.  No abdominal pain or nausea.  No complaint.  He remains hospitalized after developing recurrent atrial fibrillation, now with better rate control on medical therapy.  [name] was evaluated by physical therapy yesterday.  Skilled nursing facility placement is recommended. 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[place])   04/11/2019 -  Chemotherapy   The patient had palonosetron (ALOXI) injection 0.25 mg, 0.25 mg, Intravenous,  Once, 4 of 6 cycles Administration: 0.25 mg (04/11/2019), 0.25 mg (05/28/2019), 0.25 mg (06/11/2019), 0.25 mg (06/25/2019) pegfilgrastim-cbqv (UDENYCA) injection 6 mg, 6 mg, Subcutaneous, Once, 3 of 5 cycles Administration: 6 mg (05/30/2019), 6 mg (06/13/2019), 6 mg (06/27/2019) leucovorin 972 mg in dextrose 5 % 250 mL infusion, 400 mg/m2 = 972 mg, Intravenous,  Once, 4 of 6 cycles Administration: 972 mg (04/11/2019), 972 mg (05/28/2019), 972 mg (06/11/2019), 972 mg (06/25/2019) oxaliplatin (ELOXATIN) 200 mg in dextrose 5 % 500 mL chemo infusion, 83 mg/m2 = 205 mg, Intravenous,  Once, 4 of 6 cycles Administration: 200 mg (04/11/2019), 200 mg (05/28/2019), 200 mg (06/11/2019), 200 mg (06/25/2019) fluorouracil (ADRUCIL) chemo injection 950 mg, 400 mg/m2 = 950 mg, Intravenous,  Once, 1 of 1 cycle Administration: 950 mg (04/11/2019) fluorouracil (ADRUCIL) 5,850 mg in sodium chloride 0.9 % 133 mL chemo infusion, 2,400 mg/m2 = 5,850 mg, Intravenous, 1 Day/Dose, 4 of 6 cycles Dose modification: 2,000 mg/m2 (original dose 2,400 mg/m2, Cycle 2, Reason: Provider Judgment) Administration: 5,850 mg (04/11/2019), 4,850 mg (05/28/2019), 4,850 mg (06/11/2019), 4,850 mg (06/25/2019)  for chemotherapy treatment.    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[place])   04/11/2019 -  Chemotherapy   The patient had palonosetron (ALOXI) injection 0.25 mg, 0.25 mg, Intravenous,  Once, 4 of 6 cycles Administration: 0.25 mg (04/11/2019), 0.25 mg (05/28/2019), 0.25 mg (06/11/2019), 0.25 mg (06/25/2019) pegfilgrastim-cbqv (UDENYCA) injection 6 mg, 6 mg, Subcutaneous, Once, 3 of 5 cycles Administration: 6 mg (05/30/2019), 6 mg (06/13/2019), 6 mg (06/27/2019) leucovorin 972 mg in dextrose 5 % 250 mL infusion, 400 mg/m2 = 972 mg, Intravenous,  Once, 4 of 6 cycles Administration: 972 mg (04/11/2019), 972 mg (05/28/2019), 972 mg (06/11/2019), 972 mg (06/25/2019) oxaliplatin (ELOXATIN) 200 mg in dextrose 5 % 500 mL chemo infusion, 83 mg/m2 = 205 mg, Intravenous,  Once, 4 of 6 cycles Administration: 200 mg (04/11/2019), 200 mg (05/28/2019), 200 mg (06/11/2019), 200 mg (06/25/2019) fluorouracil (ADRUCIL) chemo injection 950 mg, 400 mg/m2 = 950 mg, Intravenous,  Once, 1 of 1 cycle Administration: 950 mg (04/11/2019) fluorouracil (ADRUCIL) 5,850 mg in sodium chloride 0.9 % 133 mL chemo infusion, 2,400 mg/m2 = 5,850 mg, Intravenous, 1 Day/Dose, 4 of 6 cycles Dose modification: 2,000 mg/m2 (original dose 2,400 mg/m2, Cycle 2, Reason: Provider Judgment) Administration: 5,850 mg (04/11/2019), 4,850 mg (05/28/2019), 4,850 mg (06/11/2019), 4,850 mg (06/25/2019)  for chemotherapy treatment.      PHYSICAL EXAMINATION:  Vitals:   07/18/19 0922 07/18/19 1300  BP: 137/69   Pulse: 72   Resp:    Temp:  98.9 F (37.2 C)  SpO2:     Filed Weights   07/16/19 0500 07/17/19 0500 07/18/19 0500  Weight: 218 lb 14.7 oz (99.3 kg) 218 lb 14.7 oz (99.3 kg) 218 lb 14.7 oz (99.3 kg)  Intake/Output from previous day: 06/16 0701 - 06/17 0700 In: 183.6 [I.V.:133.7; IV Piggyback:50] Out: 1200 [Urine:1200]  GENERAL:alert, no distress and comfortable HEENT: No thrush or ulcers LUNGS: Clear anteriorly HEART: Regular rhythm ABDOMEN:abdomen soft,  non-tender  NEURO: alert & oriented x 3 with fluent speech, no focal motor/sensory deficits Vascular: No leg edema LABORATORY DATA:  I have reviewed the data as listed CMP Latest Ref Rng & Units 07/18/2019 07/17/2019 07/16/2019  Glucose 70 - 99 mg/dL 196(H) 184(H) 144(H)  BUN 8 - 23 mg/dL 18 19 17   Creatinine 0.61 - 1.24 mg/dL 1.13 1.08 1.01  Sodium 135 - 145 mmol/L 132(L) 133(L) 132(L)  Potassium 3.5 - 5.1 mmol/L 3.6 3.7 3.4(L)  Chloride 98 - 111 mmol/L 99 101 100  CO2 22 - 32 mmol/L 24 25 23   Calcium 8.9 - 10.3 mg/dL 7.6(L) 8.0(L) 7.6(L)  Total Protein 6.5 - 8.1 g/dL 5.7(L) - -  Total Bilirubin 0.3 - 1.2 mg/dL 0.5 - -  Alkaline Phos 38 - 126 U/L 105 - -  AST 15 - 41 U/L 40 - -  ALT 0 - 44 U/L 29 - -    Lab Results  Component Value Date   WBC 8.8 07/18/2019   HGB 8.1 (L) 07/18/2019   HCT 25.6 (L) 07/18/2019   MCV 91.1 07/18/2019   PLT 175 07/18/2019   NEUTROABS 6.2 07/18/2019    DG Chest 1 View  Result Date: 07/09/2019 CLINICAL DATA:  Tachycardia and diffuse abdominal pain. EXAM: CHEST  1 VIEW COMPARISON:  04/28/2019 FINDINGS: Previous median sternotomy. Power port inserted from a right jugular approach with tip at the SVC RA junction. Heart size is upper limits of normal with some left ventricular prominence. No acute mediastinal finding. The pulmonary vascularity is normal. The lungs are clear. No infiltrate, collapse or effusion. IMPRESSION: No active disease. Electronically Signed   By: Nelson Chimes M.D.   On: 07/09/2019 14:21   DG Abd 1 View  Result Date: 07/10/2019 CLINICAL DATA:  Nasogastric tube placement. EXAM: ABDOMEN - 1 VIEW COMPARISON:  July 09, 2019 (1:53 p.m.) FINDINGS: Multiple sternal wires and sternal fixation plates and screws are seen. A nasogastric tube is seen with its distal tip overlying the body of the stomach. Numerous dilated small bowel loops are again seen throughout the abdomen and pelvis. No radio-opaque calculi or other significant radiographic  abnormality are seen. IMPRESSION: Nasogastric tube positioning, as described above. Electronically Signed   By: Virgina Norfolk M.D.   On: 07/10/2019 17:56   DG Abdomen 1 View  Result Date: 07/09/2019 CLINICAL DATA:  Tachycardia.  Diffuse abdominal pain. EXAM: ABDOMEN - 1 VIEW COMPARISON:  None. FINDINGS: There is gas throughout small and large bowel most consistent with ileus. No focal dilatation to suggest obstruction. No free air. No abnormal calcifications. Ordinary degenerative changes affect the spine. IMPRESSION: Possible ileus pattern. No evidence of frank obstruction or focal lesion. Electronically Signed   By: Nelson Chimes M.D.   On: 07/09/2019 14:22   CT ABDOMEN PELVIS W CONTRAST  Result Date: 07/10/2019 CLINICAL DATA:  Rectal tumor. Abdominal distention. No bowel movement for days. EXAM: CT ABDOMEN AND PELVIS WITH CONTRAST TECHNIQUE: Multidetector CT imaging of the abdomen and pelvis was performed using the standard protocol following bolus administration of intravenous contrast. CONTRAST:  143m OMNIPAQUE IOHEXOL 300 MG/ML  SOLN COMPARISON:  03/15/2019 FINDINGS: Lower chest: Atelectasis and patchy airspace opacity in the lower lungs. Atherosclerosis and postoperative heart. Hepatobiliary: Multifocal hepatic metastatic disease with ill-defined masses  measuring up to 4 cm in the posterior right liver, roughly 5 mm smaller than the before. Evidence of biliary obstruction or stone. Pancreas: Diffuse fatty atrophy with some preservation of density at the tail. There could be a cyst at the uncinate process, incidental in this setting. Spleen: Unremarkable. Adrenals/Urinary Tract: Negative adrenals. No hydronephrosis or stone. Simple bilateral renal cysts. Unremarkable bladder. Stomach/Bowel: Known sigmoid mass with severe luminal stenosis. The adjacent wall thickening is less thickened, but there is still spiculated density extending through the serosa into the mesentery. Above this narrowing there is  generalized gas and fluid dilated bowel. Vascular/Lymphatic: No acute vascular finding. Decrease in size of IMA lymph nodes. Reproductive:No pathologic findings. Other: No ascites or pneumoperitoneum. Musculoskeletal: No acute abnormalities. IMPRESSION: 1. 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 2. Multifocal hepatic metastatic disease which is stable or slightly improved from February 2021. Electronically Signed   By: Monte Fantasia M.D.   On: 07/10/2019 11:32   DG Abd 2 Views  Result Date: 07/13/2019 CLINICAL DATA:  Status post colonic stent placement EXAM: ABDOMEN - 2 VIEW COMPARISON:  July 10, 2019 FINDINGS: There is an apparent stent in the sigmoid colon region overlying the sacrum. There is no appreciable bowel dilatation or air-fluid level to suggest bowel obstruction. No free air. Nasogastric tube tip and side port in stomach. There are surgical clips in the upper abdomen. Lung bases are clear. IMPRESSION: Apparent stent in the sigmoid colon region overlying the sacrum. Nasogastric tube tip and side port in stomach. No bowel obstruction or free air. Lung bases clear. Electronically Signed   By: Lowella Grip III M.D.   On: 07/13/2019 11:44   DG C-Arm 1-60 Min-No Report  Result Date: 07/12/2019 Fluoroscopy was utilized by the requesting physician.  No radiographic interpretation.   ECHOCARDIOGRAM LIMITED  Result Date: 07/17/2019    ECHOCARDIOGRAM LIMITED REPORT   Patient Name:   Clarence Andrews. Date of Exam: 07/17/2019 Medical Rec #:  [id number]          Height:       74.0 in Accession #:    [id number]         Weight:       218.9 lb Date of Birth:  April 06, 1950           BSA:          2.259 m Patient Age:    69 years           BP:           117/54 mmHg Patient Gender: M                  HR:           98 bpm. Exam Location:  Inpatient Procedure: Limited Echo, Intracardiac Opacification  Agent, Color Doppler and            Cardiac Doppler Indications:    Congestive Heart Failure 428.0 / I50.9  History:        Patient has prior history of Echocardiogram examinations, most                 recent 04/28/2019. CAD, Prior CABG; Risk Factors:Diabetes.                 Malignant neoplasm of sigmoid colon, history of throat cancer.  Sonographer:    Darlina Sicilian RDCS Referring Phys: [id number] Nadean Corwin A  ACHARYA IMPRESSIONS  1. Left ventricular ejection fraction, by estimation, is 55-60%. The left ventricle has normal function. Left ventricular endocardial border not optimally defined to evaluate regional wall motion even with definity contrast. There is severe left ventricular hypertrophy of the basal-septal segment. Left ventricular diastolic parameters are indeterminate.  2. Right ventricular systolic function was not well visualized. The right ventricular size is normal. There is normal pulmonary artery systolic pressure. The estimated right ventricular systolic pressure is 51.8 mmHg.  3. The mitral valve is normal in structure. No evidence of mitral valve regurgitation. No evidence of mitral stenosis.  4. The aortic valve is tricuspid. Aortic valve regurgitation is trivial. Mild aortic valve sclerosis is present, with no evidence of aortic valve stenosis.  5. Aortic dilatation noted. There is mild dilatation at the level of the sinuses of Valsalva measuring 43 mm.  6. The inferior vena cava is dilated in size with <50% respiratory variability, suggesting right atrial pressure of 15 mmHg. FINDINGS  Left Ventricle: Left ventricular ejection fraction, by estimation, is 55 to 60%. The left ventricle has normal function. Left ventricular endocardial border not optimally defined to evaluate regional wall motion. The left ventricular internal cavity size was normal in size. There is severe left ventricular hypertrophy of the basal-septal segment. Right Ventricle: The right ventricular size is normal. No increase in  right ventricular wall thickness. Right ventricular systolic function was not well visualized. There is normal pulmonary artery systolic pressure. The tricuspid regurgitant velocity is  1.36 m/s, and with an assumed right atrial pressure of 15 mmHg, the estimated right ventricular systolic pressure is 84.1 mmHg. Left Atrium: Left atrial size was normal in size. Right Atrium: Right atrial size was normal in size. Pericardium: There is no evidence of pericardial effusion. Mitral Valve: The mitral valve is normal in structure. Normal mobility of the mitral valve leaflets. No evidence of mitral valve stenosis. Tricuspid Valve: The tricuspid valve is normal in structure. Tricuspid valve regurgitation is trivial. No evidence of tricuspid stenosis. Aortic Valve: The aortic valve is tricuspid. Aortic valve regurgitation is trivial. Mild aortic valve sclerosis is present, with no evidence of aortic valve stenosis. Pulmonic Valve: The pulmonic valve was normal in structure. Pulmonic valve regurgitation is not visualized. No evidence of pulmonic stenosis. Aorta: Aortic dilatation noted. There is mild dilatation at the level of the sinuses of Valsalva measuring 43 mm. Venous: The inferior vena cava is dilated in size with less than 50% respiratory variability, suggesting right atrial pressure of 15 mmHg. IAS/Shunts: No atrial level shunt detected by color flow Doppler.  LEFT VENTRICLE PLAX 2D LVIDd:         4.30 cm LVIDs:         3.55 cm LV PW:         1.05 cm LV IVS:        1.80 cm LVOT diam:     2.20 cm LV SV:         48 LV SV Index:   21 LVOT Area:     3.80 cm  LV Volumes (MOD) LV vol d, MOD A2C: 82.9 ml LV vol d, MOD A4C: 173.0 ml LV vol s, MOD A2C: 56.7 ml LV vol s, MOD A4C: 88.6 ml LV SV MOD A2C:     26.2 ml LV SV MOD A4C:     173.0 ml LV SV MOD BP:      51.9 ml LEFT ATRIUM         Index LA diam:  2.40 cm 1.06 cm/m  AORTIC VALVE LVOT Vmax:   75.00 cm/s LVOT Vmean:  57.000 cm/s LVOT VTI:    0.126 m  AORTA Ao Root diam:  4.30 cm MITRAL VALVE               TRICUSPID VALVE MV Area (PHT): 4.96 cm    TR Peak grad:   7.4 mmHg MV Decel Time: 153 msec    TR Vmax:        136.00 cm/s MV E velocity: 95.20 cm/s                            SHUNTS                            Systemic VTI:  0.13 m                            Systemic Diam: 2.20 cm Fransico Him MD Electronically signed by Fransico Him MD Signature Date/Time: 07/17/2019/10:43:14 AM    Final     ASSESSMENT AND PLAN: 1. 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 Colonoscopy 03/07/2019-nearly completely obstructing mass beginning at 16 cm from the anal verge, sigmoid colon polyp ? CTs 03/15/2019-wall thickening of the lower sigmoid colon, low rectal mass with significant luminal narrowing, diffuse liver metastases, borderline enlarged sigmoid mesocolon, iliac, and upper abdominal lymph nodes ? Elevated CEA ? 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 Cycle 1 FOLFOX 04/11/2019 ? Cycle 2 FOLFOX 05/28/2019 ? Cycle 3 FOLFOX 06/11/2019 ? Cycle 4 FOLFOX 06/25/2019 ? CT abdomen/pelvis 07/10/2027-severe obstructing stricture at the sigmoid colon, stable to slight improvement in hepatic metastatic disease compared to February 2021 2. 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 Diabetes 4. Coronary artery disease, status post coronary artery bypass surgery in 2017 5. Neutropenia following cycle 1 FOLFOX 6. Admission 04/28/2019 with COVID-19 infection 7. C. difficile colitis 04/28/2019 8. Atrial flutter during hospital admission March/April 2021-treated with a Cardizem drip, digoxin, and Eliquis anticoagulation. Converted to metoprolol at discharge.  Admission with recurrent rapid atrial fibrillation/flutter  07/09/2019 9. 07/09/2019-hospital admission for bowel obstruction  Sigmoidoscopy 07/12/2019-completely obstructing mass at 17-20 cm proximal to the anus-oozing present, stent placed  [name] appears well today.  He is stable for discharge from an oncology standpoint.  He has anemia secondary to chronic disease, phlebotomy, chemotherapy, and subclinical GI bleeding.  He has remained in the hospital for control of rapid atrial fibrillation/flutter.  The heart rate has improved.  He has limited ability to ambulate and lives alone.  I agree with skilled nursing facility placement.  He is scheduled for outpatient follow-up at the Cancer center 07/24/2019 to resume systemic chemotherapy.   Please call oncology as needed.  LOS: 9 days   Betsy Coder, MD 07/18/19

## 2019-07-19 LAB — CBC WITH DIFFERENTIAL/PLATELET
Abs Immature Granulocytes: 0.07 10*3/uL (ref 0.00–0.07)
Basophils Absolute: 0 10*3/uL (ref 0.0–0.1)
Basophils Relative: 0 %
Eosinophils Absolute: 0.2 10*3/uL (ref 0.0–0.5)
Eosinophils Relative: 2 %
HCT: 25.1 % — ABNORMAL LOW (ref 39.0–52.0)
Hemoglobin: 8 g/dL — ABNORMAL LOW (ref 13.0–17.0)
Immature Granulocytes: 1 %
Lymphocytes Relative: 8 %
Lymphs Abs: 0.7 10*3/uL (ref 0.7–4.0)
MCH: 28.9 pg (ref 26.0–34.0)
MCHC: 31.9 g/dL (ref 30.0–36.0)
MCV: 90.6 fL (ref 80.0–100.0)
Monocytes Absolute: 1.3 10*3/uL — ABNORMAL HIGH (ref 0.1–1.0)
Monocytes Relative: 15 %
Neutro Abs: 6.6 10*3/uL (ref 1.7–7.7)
Neutrophils Relative %: 74 %
Platelets: 200 10*3/uL (ref 150–400)
RBC: 2.77 MIL/uL — ABNORMAL LOW (ref 4.22–5.81)
RDW: 18 % — ABNORMAL HIGH (ref 11.5–15.5)
WBC: 9 10*3/uL (ref 4.0–10.5)
nRBC: 0 % (ref 0.0–0.2)

## 2019-07-19 LAB — GLUCOSE, CAPILLARY
Glucose-Capillary: 242 mg/dL — ABNORMAL HIGH (ref 70–99)
Glucose-Capillary: 245 mg/dL — ABNORMAL HIGH (ref 70–99)
Glucose-Capillary: 185 mg/dL — ABNORMAL HIGH (ref 70–99)
Glucose-Capillary: 210 mg/dL — ABNORMAL HIGH (ref 70–99)

## 2019-07-19 LAB — MAGNESIUM: Magnesium: 1.7 mg/dL (ref 1.7–2.4)

## 2019-07-19 LAB — COMPREHENSIVE METABOLIC PANEL
ALT: 33 U/L (ref 0–44)
AST: 43 U/L — ABNORMAL HIGH (ref 15–41)
Albumin: 2.1 g/dL — ABNORMAL LOW (ref 3.5–5.0)
Alkaline Phosphatase: 112 U/L (ref 38–126)
Anion gap: 8 (ref 5–15)
BUN: 18 mg/dL (ref 8–23)
CO2: 24 mmol/L (ref 22–32)
Calcium: 7.9 mg/dL — ABNORMAL LOW (ref 8.9–10.3)
Chloride: 102 mmol/L (ref 98–111)
Creatinine, Ser: 1.05 mg/dL (ref 0.61–1.24)
GFR calc Af Amer: >60 mL/min (ref >60)
GFR calc non Af Amer: >60 mL/min (ref >60)
Glucose, Bld: 174 mg/dL — ABNORMAL HIGH (ref 70–99)
Potassium: 3.6 mmol/L (ref 3.5–5.1)
Sodium: 134 mmol/L — ABNORMAL LOW (ref 135–145)
Total Bilirubin: 0.6 mg/dL (ref 0.3–1.2)
Total Protein: 5.7 g/dL — ABNORMAL LOW (ref 6.5–8.1)

## 2019-07-19 LAB — PHOSPHORUS: Phosphorus: 2.3 mg/dL — ABNORMAL LOW (ref 2.5–4.6)

## 2019-07-19 MED ORDER — METOPROLOL TARTRATE 5 MG/5ML IV SOLN
5.0000 mg | Freq: Once | INTRAVENOUS | Status: AC | PRN
  Administered 2019-07-19: 5 mg via INTRAVENOUS
  Filled 2019-07-19: qty 5

## 2019-07-19 MED ORDER — DIGOXIN 125 MCG PO TABS
0.1250 mg | ORAL_TABLET | Freq: Once | ORAL | Status: AC
  Administered 2019-07-19: 0.125 mg via ORAL
  Filled 2019-07-19: qty 1

## 2019-07-19 MED ORDER — DILTIAZEM HCL 25 MG/5ML IV SOLN
5.0000 mg | Freq: Once | INTRAVENOUS | Status: AC
  Administered 2019-07-19: 5 mg via INTRAVENOUS
  Filled 2019-07-19: qty 5

## 2019-07-19 MED ORDER — METOPROLOL TARTRATE 5 MG/5ML IV SOLN
5.0000 mg | INTRAVENOUS | Status: AC | PRN
  Administered 2019-07-19 (×2): 5 mg via INTRAVENOUS
  Filled 2019-07-19 (×2): qty 5

## 2019-07-19 MED ORDER — METOPROLOL TARTRATE 25 MG PO TABS
50.0000 mg | ORAL_TABLET | Freq: Two times a day (BID) | ORAL | Status: DC
  Administered 2019-07-19 – 2019-07-20 (×2): 50 mg via ORAL
  Filled 2019-07-19: qty 2

## 2019-07-19 MED ORDER — DIGOXIN 125 MCG PO TABS
0.2500 mg | ORAL_TABLET | Freq: Every day | ORAL | Status: DC
  Administered 2019-07-20 – 2019-07-24 (×5): 0.25 mg via ORAL
  Filled 2019-07-19 (×5): qty 2

## 2019-07-19 MED ORDER — METOPROLOL TARTRATE 25 MG PO TABS
25.0000 mg | ORAL_TABLET | Freq: Once | ORAL | Status: AC
  Administered 2019-07-19: 25 mg via ORAL
  Filled 2019-07-19: qty 1

## 2019-07-19 NOTE — Progress Notes (Signed)
Physical Therapy Treatment Patient Details Name: Clarence Andrews. MRN: [id number] DOB: 03-06-1950 Today's Date: 07/19/2019    History of Present Illness 69 y.o. male with a history of CAD s/p CABG, persistent atrial fibrillation/flutter not on anticoagulation secondary to rectal bleeding, chronic systolic heart failure, previous COVID-19 infection, metastatic colon cancer. Patient presented from his oncology office secondary to atrial fibrillation with RVR. While admitted he developed significant nausea and vomiting with CT evidence of obstruction of his sigmoid from is colonic mass    PT Comments    The patient is making progress in ambulation.today. Does require 2 assisting to stand and power up, transition hands to the RW, requiring steady assist. Resting HR 90, 101/72, seated 87/58, HR 131, post amb 134, 97/68. Patient is pleased with ambulation today. .    Follow Up Recommendations  SNF     Equipment Recommendations  None recommended by PT    Recommendations for Other Services       Precautions / Restrictions Precautions Precautions: Fall Precaution Comments: monitor HR    Mobility  Bed Mobility Overal bed mobility: Needs Assistance Bed Mobility: Rolling;Sidelying to Sit Rolling: Min assist Sidelying to sit: Mod assist Supine to sit: Mod assist     General bed mobility comments: rolling to perform pericare (BM in bed); assist to sit upright for trunk and for assisting LEs.  Transfers Overall transfer level: Needs assistance Equipment used: Rolling walker (2 wheeled) Transfers: Sit to/from Stand Sit to Stand: +2 safety/equipment;+2 physical assistance;Mod assist         General transfer comment: stood up at [facility] on first trial from elevated bed.  Mod assist  of 2 from recliner using armrests. Difficult to transition hands from armrests to RW, much extra time. Cues for hand placement to sit down  Ambulation/Gait Ambulation/Gait assistance: Min assist;+2  safety/equipment Gait Distance (Feet): 80 Feet (x2) Assistive device: Rolling walker (2 wheeled) Gait Pattern/deviations: Step-through pattern;Trunk flexed;Decreased stride length Gait velocity: decr   General Gait Details: Patient provided cues for posture and position inside RW, gait slow. Max HR 134 from 90(resting) Sats 100%   Stairs             Wheelchair Mobility    Modified Rankin (Stroke Patients Only)       Balance Overall balance assessment: Needs assistance Sitting-balance support: Bilateral upper extremity supported;Feet supported Sitting balance-Leahy Scale: Good Sitting balance - Comments: no LOB while donning socks   Standing balance support: Bilateral upper extremity supported;During functional activity Standing balance-Leahy Scale: Fair Standing balance comment: BUE on walker.                            Cognition Arousal/Alertness: Awake/alert Behavior During Therapy: WFL for tasks assessed/performed Overall Cognitive Status: Within Functional Limits for tasks assessed                                        Exercises      General Comments        Pertinent Vitals/Pain Pain Assessment: Faces Faces Pain Scale: Hurts a little bit Pain Location: right knee Pain Descriptors / Indicators: Discomfort Pain Intervention(s): Monitored during session    Home Living                      Prior Function  PT Goals (current goals can now be found in the care plan section) Acute Rehab PT Goals Patient Stated Goal: To go home Progress towards PT goals: Progressing toward goals    Frequency    Min 2X/week      PT Plan Current plan remains appropriate    Co-evaluation PT/OT/SLP Co-Evaluation/Treatment: Yes Reason for Co-Treatment: For patient/therapist safety;To address functional/ADL transfers PT goals addressed during session: Mobility/safety with mobility OT goals addressed during session:  ADL's and self-care      AM-PAC PT "6 Clicks" Mobility   Outcome Measure  Help needed turning from your back to your side while in a flat bed without using bedrails?: A Little Help needed moving from lying on your back to sitting on the side of a flat bed without using bedrails?: A Little Help needed moving to and from a bed to a chair (including a wheelchair)?: A Lot Help needed standing up from a chair using your arms (e.g., wheelchair or bedside chair)?: A Lot Help needed to walk in hospital room?: A Lot Help needed climbing 3-5 steps with a railing? : Total 6 Click Score: 13    End of Session Equipment Utilized During Treatment: Gait belt Activity Tolerance: Patient tolerated treatment well Patient left: in chair;with call bell/phone within reach;with chair alarm set Nurse Communication: Mobility status PT Visit Diagnosis: Other abnormalities of gait and mobility (R26.89);Muscle weakness (generalized) (M62.81)     Time: 0240-9735 PT Time Calculation (min) (ACUTE ONLY): 34 min  Charges:  $Gait Training: 8-22 mins                     [place] Pager [phone] Office [phone]    Claretha Cooper 07/19/2019, 1:39 PM

## 2019-07-19 NOTE — Progress Notes (Signed)
PROGRESS NOTE    [name]  XFG:[id number] DOB: [birth year] DOA: 07/09/2019 PCP: Patient, No Pcp Per   Chief Complaint  Patient presents with  . Weakness   Brief Narrative: Clarence Andrews. is Clarence Andrews 69 y.o. male with Ashon Rosenberg history of CAD s/p CABG, persistent atrial fibrillation/flutter not on anticoagulation secondary to rectal bleeding, chronic systolic heart failure, previous COVID-19 infection, metastatic colon cancer. Patient presented from his oncology office secondary to atrial fibrillation with RVR. While admitted he developed significant nausea and vomiting with CT evidence of obstruction of his sigmoid from is colonic mass.  Assessment & Plan:   Principal Problem:   Atrial fibrillation with RVR ([place]) Active Problems:   Cancer of left colon (HCC)   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HCC)   Type 2 diabetes mellitus without complication (HCC)   CAD (coronary artery disease)   Chronic systolic heart failure (HCC)   Large bowel obstruction (HCC)  Persistent atrial flutter/fibrillation with RVR Patient with Krizia Flight history of atrial fibrillation as an outpatient on metoprolol 100 mg BID and digoxin 0.125 mg daily.  Bradycardia noted yesterday, 48-50 -> diltiazem d/c'd Overnight, tachy to 130's -> IV metop Metoprolol increased to 50 mg BID, IV metop prn Digoxin 0.125 mg daily Repeat echo with EF 55-60%, severe LVH of basal septal segment (see report) Cardiology c/s, appreciate recs Not on anticoagulation due to risk of rectal bleeding   Large bowel obstruction Patient with no bowel movement in 5 days prior to admission. No prior history of obstruction but has had abdominal surgery in the past. History of adenocarcinoma of the colon as mentioned below. Abdominal x-ray (6/8) significant for possible ileus. CT abdomen/pelvis (6/9) obtained and significant for severe/obstructing malignant stricture at sigmoid colon. General surgery and GI consulted on 6/9. Patient felt better after NG  tube placement. Having bowel movements after colonic stent placed which was significant for completely obstructing colonic mass. -GI recommendations: advance diet as able; stent placed 6/11 -General surgery recommendations pending. Signed off, outpatient follow-up  Leukocytosis Improved, continue to monitor  Adenocarcinoma of the colon Metastatic adenocarcinoma to liver Recently diagnosed. Currently follows with medical oncology, Dr. [name] and is on active chemotherapy treatment.  Chronic systolic heart failure EF of 40-45%. Patient is euvolemic on admission. On metoprolol (Lopressor) as an outpatient. Patient is not on diuretic therapy. Rales noted on exam but chest x-ray without evidence of effusion or edema.  - 6/16 EF 55-60%, severe LVH (see report) -Watch fluid status; daily weights and strict in/out  History of CABG CAD Patient was previously on aspirin which was discontinued secondary to rectal bleeding.  Diabetes mellitus, type 2 Patient is on glipizide as an outpatient. Last hemoglobin A1C of 7.8%.  -Continue SSI  AKI on  CKD stage II Baseline creatinine of 1.2. Peak creatinine of 1.5 which is now back to baseline with IV fluids. Resolved.  Tick Bite: tick found on L chest 6/16, removed with tweezer.  Follow for rash.  Given 200 mg doxycycline x1 for ppx.  Therapy recommending SNF.  Pt resistant to this idea, but I think it's unsafe for discharge home as he has no support at home.  Now seems more agreeable to d/c to SNF.  C/s TOC team for assistance.  DVT prophylaxis: SCD Code Status: full  Family Communication: none at bedside Disposition:   Status is: Inpatient  Remains inpatient appropriate because:Inpatient level of care appropriate due to severity of illness   Dispo: The patient is from: Home **Note De-Identified vi Obfusction** Anticipted d/c is to: pending              Anticipted d/c dte is: 3 dys              Ptient currently is not mediclly stble to d/c.    Consultnts:   Crdiology  Procedures:  Echo IMPRESSIONS    1. Left ventriculr ejection frction, by estimtion, is 55-60%. The left  ventricle hs norml function. Left ventriculr endocrdil border not  optimlly defined to evlute regionl wll motion even with definity  contrst. There is severe left  ventriculr hypertrophy of the bsl-septl segment. Left ventriculr  distolic prmeters re indeterminte.  2. Right ventriculr systolic function ws not well visulized. The right  ventriculr size is norml. There is norml pulmonry rtery systolic  pressure. The estimted right ventriculr systolic pressure is 34.7 mmHg.  3. The mitrl vlve is norml in structure. No evidence of mitrl vlve  regurgittion. No evidence of mitrl stenosis.  4. The ortic vlve is tricuspid. Aortic vlve regurgittion is trivil.  Mild ortic vlve sclerosis is present, with no evidence of ortic vlve  stenosis.  5. Aortic dilttion noted. There is mild dilttion t the level of the  sinuses of Vlslv mesuring 43 mm.  6. The inferior ven cv is dilted in size with <50% respirtory  vribility, suggesting right tril pressure of 15 mmHg.   Impression - Preprtion of the colon ws fir but indequte for pproprite screening purposes but not for dignostic/therpeutic intention.. - Hemorrhoids found on digitl rectl exm. - Mlignnt completely obstructing tumor from 17 to 20 cm proximl to the nus. - Dilted in the sigmoid colon proximl to the obstructing colonic mss. - Wire plced beyond the stenosis, re injected with rdiogrphic contrst to delinete stenosis further. Prosthesis plced. Recommendtions - The ptient will be observed post-procedure, until ll dischrge criteri re met. - Ptient to the PACU for recovery. - Return ptient to hospitl wrd for ongoing cre therefter. - Observe clinicl sttus. - My tril sips of cler liquids the rest  of tody but mintin NGT in plce but cn try to cp for now. - If ptient tolertes the sips nd KUB tomorrow (lredy ordered) looks to hve enterl stent still in good position, then would llow cler liquids to dvnce to full liquids throughout [place]urdy. On Sundy, if ptient is doing well, then my trnsition to soft/pureed foods for next 48 hours. Then my trnsition to  low-residue diet moving forwrd. Stop ll fiber supplementtion. - Recommend tht ptient be initited on Colce 200 mg 1-2 times dily on [place]urdy. Would lso recommend Mirlx 1-2 cpfuls dily in effort of trying to optimize stool consistency nd not llow stent to become clogged in the coming dys. - Stent should fully expnd over the next 2-weeks. - The findings nd recommendtions were discussed with the ptient. - The findings nd recommendtions were discussed with the referring physicin. - The findings nd recommendtions were discussed with the Hospitl Tem.  Antimicrobils:  Anti-infectives (From dmission, onwrd)   Strt     Dose/Rte Route Frequency Ordered Stop   07/17/19 1430  doxycycline (VIBRA-TABS) tblet 200 mg        20 0 mg Orl  Once 07/17/19 1419 07/17/19 1637     Subjective: No complints tody  Objective: Vitls:   07/19/19 0500 07/19/19 0600 07/19/19 0700 07/19/19 0800  BP: 93/65 (!) 109/54 111/66 101/72  Pulse: (!) 109 (!) 113 (!) 121 (!) 132  Resp: 20 (!) 9 (!)  25 (!) 24  Temp:      TempSrc:      SpO2: 99% 97% 100% 100%  Weight:      Height:        Intake/Output Summary (Last 24 hours) at 07/19/2019 0803 Last data filed at 07/19/2019 0600 Gross per 24 hour  Intake 76.15 ml  Output 650 ml  Net -573.85 ml   Filed Weights   07/17/19 0500 07/18/19 0500 07/19/19 0327  Weight: 99.3 kg 99.3 kg 99.5 kg    Examination:  General: No acute distress. Cardiovascular: Heart sounds show Suzi Hernan regular rate, and rhythm.  Lungs: Clear to auscultation bilaterally  Abdomen: Soft,  nontender, nondistended  Neurological: Alert and oriented 3. Moves all extremities 4. Cranial nerves II through XII grossly intact. Skin: Warm and dry. No rashes or lesions. Extremities: No clubbing or cyanosis. No edema.   Data Reviewed: I have personally reviewed following labs and imaging studies  CBC: Recent Labs  Lab 07/14/19 0527 07/16/19 0548 07/17/19 0500 07/18/19 0603 07/19/19 0315  WBC 11.5* 14.0* 10.7* 8.8 9.0  NEUTROABS  --   --   --  6.2 6.6  HGB 8.7* 8.1* 7.9* 8.1* 8.0*  HCT 27.1* 25.3* 25.1* 25.6* 25.1*  MCV 91.2 90.4 92.3 91.1 90.6  PLT 188 171 173 175 128    Basic Metabolic Panel: Recent Labs  Lab 07/15/19 1040 07/16/19 0548 07/17/19 0500 07/18/19 0603 07/19/19 0315  NA 133* 132* 133* 132* 134*  K 3.8 3.4* 3.7 3.6 3.6  CL 100 100 101 99 102  CO2 25 23 25 24 24   GLUCOSE 245* 144* 184* 196* 174*  BUN 19 17 19 18 18   CREATININE 1.15 1.01 1.08 1.13 1.05  CALCIUM 7.7* 7.6* 8.0* 7.6* 7.9*  MG 1.5* 1.3* 1.6* 1.6* 1.7  PHOS  --   --   --  2.3* 2.3*    GFR: Estimated Creatinine Clearance: 81.3 mL/min (by C-G formula based on SCr of 1.05 mg/dL).  Liver Function Tests: Recent Labs  Lab 07/18/19 0603 07/19/19 0315  AST 40 43*  ALT 29 33  ALKPHOS 105 112  BILITOT 0.5 0.6  PROT 5.7* 5.7*  ALBUMIN 2.0* 2.1*    CBG: Recent Labs  Lab 07/17/19 2138 07/18/19 0758 07/18/19 1227 07/18/19 1655 07/18/19 2123  GLUCAP 169* 171* 237* 231* 201*     Recent Results (from the past 240 hour(s))  SARS Coronavirus 2 by RT PCR (hospital order, performed in [facility] hospital lab) Nasopharyngeal Nasopharyngeal Swab     Status: None   Collection Time: 07/09/19 12:44 PM   Specimen: Nasopharyngeal Swab  Result Value Ref Range Status   SARS Coronavirus 2 NEGATIVE NEGATIVE Final    Comment: (NOTE) SARS-CoV-2 target nucleic acids are NOT DETECTED. The SARS-CoV-2 RNA is generally detectable in upper and lower respiratory specimens during the acute phase of  infection. The lowest concentration of SARS-CoV-2 viral copies this assay can detect is 250 copies / mL. Cassius Cullinane negative result does not preclude SARS-CoV-2 infection and should not be used as the sole basis for treatment or other patient management decisions.  Lillyahna Hemberger negative result may occur with improper specimen collection / handling, submission of specimen other than nasopharyngeal swab, presence of viral mutation(s) within the areas targeted by this assay, and inadequate number of viral copies (<250 copies / mL). Nakiya Rallis negative result must be combined with clinical observations, patient history, and epidemiological information. Fact Sheet for Patients:   StrictlyIdeas.no Fact Sheet for Healthcare Providers: BankingDealers.co.za This  test is not yet approved or cleared  by the [place] and has been authorized for detection and/or diagnosis of SARS-CoV-2 by FDA under an Emergency Use Authorization (EUA).  This EUA will remain in effect (meaning this test can be used) for the duration of the COVID-19 declaration under Section 564(b)(1) of the Act, 21 U.S.C. section 360bbb-3(b)(1), unless the authorization is terminated or revoked sooner. Performed at [facility], [place] [address], [place], [place] [address]   MRSA PCR Screening     Status: None   Collection Time: 07/09/19  7:53 PM   Specimen: Nasal Mucosa; Nasopharyngeal  Result Value Ref Range Status   MRSA by PCR NEGATIVE NEGATIVE Final    Comment:        The GeneXpert MRSA Assay (FDA approved for NASAL specimens only), is one component of Veroncia Jezek comprehensive MRSA colonization surveillance program. It is not intended to diagnose MRSA infection nor to guide or monitor treatment for MRSA infections. Performed at [facility], [place] [address], [place], [place] [address]          Radiology Studies: ECHOCARDIOGRAM LIMITED  Result Date:  07/17/2019    ECHOCARDIOGRAM LIMITED REPORT   Patient Name:   Clarence Andrews. Date of Exam: 07/17/2019 Medical Rec #:  [id number]          Height:       74.0 in Accession #:    [id number]         Weight:       218.9 lb Date of Birth:  Dec 28, 1950           BSA:          2.259 m Patient Age:    66 years           BP:           117/54 mmHg Patient Gender: M                  HR:           98 bpm. Exam Location:  Inpatient Procedure: Limited Echo, Intracardiac Opacification Agent, Color Doppler and            Cardiac Doppler Indications:    Congestive Heart Failure 428.0 / I50.9  History:        Patient has prior history of Echocardiogram examinations, most                 recent 04/28/2019. CAD, Prior CABG; Risk Factors:Diabetes.                 Malignant neoplasm of sigmoid colon, history of throat cancer.  Sonographer:    Darlina Sicilian RDCS Referring Phys: [id number] Nadean Corwin Deidrick Rainey Calloway  1. Left ventricular ejection fraction, by estimation, is 55-60%. The left ventricle has normal function. Left ventricular endocardial border not optimally defined to evaluate regional wall motion even with definity contrast. There is severe left ventricular hypertrophy of the basal-septal segment. Left ventricular diastolic parameters are indeterminate.  2. Right ventricular systolic function was not well visualized. The right ventricular size is normal. There is normal pulmonary artery systolic pressure. The estimated right ventricular systolic pressure is 97.3 mmHg.  3. The mitral valve is normal in structure. No evidence of mitral valve regurgitation. No evidence of mitral stenosis.  4. The aortic valve is tricuspid. Aortic valve regurgitation is trivial. Mild aortic valve sclerosis is present, with no evidence of aortic valve stenosis.  5. Aortic dilatation  noted. There is mild dilatation at the level of the sinuses of Valsalva measuring 43 mm.  6. The inferior vena cava is dilated in size with <50% respiratory variability,  suggesting right atrial pressure of 15 mmHg. FINDINGS  Left Ventricle: Left ventricular ejection fraction, by estimation, is 55 to 60%. The left ventricle has normal function. Left ventricular endocardial border not optimally defined to evaluate regional wall motion. The left ventricular internal cavity size was normal in size. There is severe left ventricular hypertrophy of the basal-septal segment. Right Ventricle: The right ventricular size is normal. No increase in right ventricular wall thickness. Right ventricular systolic function was not well visualized. There is normal pulmonary artery systolic pressure. The tricuspid regurgitant velocity is  1.36 m/s, and with an assumed right atrial pressure of 15 mmHg, the estimated right ventricular systolic pressure is 95.2 mmHg. Left Atrium: Left atrial size was normal in size. Right Atrium: Right atrial size was normal in size. Pericardium: There is no evidence of pericardial effusion. Mitral Valve: The mitral valve is normal in structure. Normal mobility of the mitral valve leaflets. No evidence of mitral valve stenosis. Tricuspid Valve: The tricuspid valve is normal in structure. Tricuspid valve regurgitation is trivial. No evidence of tricuspid stenosis. Aortic Valve: The aortic valve is tricuspid. Aortic valve regurgitation is trivial. Mild aortic valve sclerosis is present, with no evidence of aortic valve stenosis. Pulmonic Valve: The pulmonic valve was normal in structure. Pulmonic valve regurgitation is not visualized. No evidence of pulmonic stenosis. Aorta: Aortic dilatation noted. There is mild dilatation at the level of the sinuses of Valsalva measuring 43 mm. Venous: The inferior vena cava is dilated in size with less than 50% respiratory variability, suggesting right atrial pressure of 15 mmHg. IAS/Shunts: No atrial level shunt detected by color flow Doppler.  LEFT VENTRICLE PLAX 2D LVIDd:         4.30 cm LVIDs:         3.55 cm LV PW:         1.05 cm LV  IVS:        1.80 cm LVOT diam:     2.20 cm LV SV:         48 LV SV Index:   21 LVOT Area:     3.80 cm  LV Volumes (MOD) LV vol d, MOD A2C: 82.9 ml LV vol d, MOD A4C: 173.0 ml LV vol s, MOD A2C: 56.7 ml LV vol s, MOD A4C: 88.6 ml LV SV MOD A2C:     26.2 ml LV SV MOD A4C:     173.0 ml LV SV MOD BP:      51.9 ml LEFT ATRIUM         Index LA diam:    2.40 cm 1.06 cm/m  AORTIC VALVE LVOT Vmax:   75.00 cm/s LVOT Vmean:  57.000 cm/s LVOT VTI:    0.126 m  AORTA Ao Root diam: 4.30 cm MITRAL VALVE               TRICUSPID VALVE MV Area (PHT): 4.96 cm    TR Peak grad:   7.4 mmHg MV Decel Time: 153 msec    TR Vmax:        136.00 cm/s MV E velocity: 95.20 cm/s                            SHUNTS  Systemic VTI:  0.13 m                            Systemic Diam: 2.20 cm Fransico Him MD Electronically signed by Fransico Him MD Signature Date/Time: 07/17/2019/10:43:14 AM    Final         Scheduled Meds: . Chlorhexidine Gluconate Cloth  6 each Topical Q0600  . digoxin  0.125 mg Oral Daily  . diltiazem  30 mg Oral Once  . docusate sodium  200 mg Oral BID  . insulin aspart  0-15 Units Subcutaneous TID WC  . insulin aspart  0-5 Units Subcutaneous QHS  . metoprolol tartrate  25 mg Oral Once  . metoprolol tartrate  50 mg Oral BID  . pantoprazole (PROTONIX) IV  40 mg Intravenous QHS  . sodium chloride flush  10-40 mL Intracatheter Q12H   Continuous Infusions:    LOS: 10 days    Time spent: over 30 min    Fayrene Helper, MD [facility]   To contact the attending provider between 7A-7P or the covering provider during after hours 7P-7A, please log into the web site [email or web address] and access using universal  password for that web site. If you do not have the password, please call the hospital operator.  07/19/2019, 8:03 AM

## 2019-07-19 NOTE — Progress Notes (Signed)
Notified [place] at 0215 of HR sustained 135 noted held 1600 dose of metoprolol r/t low HR. Provider ordered 5 mg bolus of cardizem. At [place] Notified provider HR sustaining 135. Metoprolol 5 mg q90min ordered. Two doses were given at 0416 and 0555. As of 0630 HR fluctuating 100's-120's. Metoprolol bolus order is now marked as completed.

## 2019-07-19 NOTE — Progress Notes (Addendum)
Progress Note  Patient Name: Clarence Andrews. 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 I have offered social work services.  Inpatient Medications    Scheduled Meds: . Chlorhexidine Gluconate Cloth  6 each Topical Q0600  . digoxin  0.125 mg Oral Daily  . diltiazem  30 mg Oral Once  . docusate sodium  200 mg Oral BID  . insulin aspart  0-15 Units Subcutaneous TID WC  . insulin aspart  0-5 Units Subcutaneous QHS  . metoprolol tartrate  50 mg Oral BID  . pantoprazole (PROTONIX) IV  40 mg Intravenous QHS  . sodium chloride flush  10-40 mL Intracatheter Q12H   Continuous Infusions:  PRN Meds: alum & mag hydroxide-simeth, fentaNYL (SUBLIMAZE) injection, levalbuterol, liver oil-zinc oxide, ondansetron **OR** ondansetron (ZOFRAN) IV, phenol, polyethylene glycol, sodium chloride flush   Vital Signs    Vitals:   07/19/19 1000 07/19/19 1007 07/19/19 1100 07/19/19 1200  BP: (!) 89/51  100/71   Pulse: (!) 133 (!) 133 (!) 103   Resp: (!) 28  (!) 25   Temp:    98.2 F (36.8 C)  TempSrc:    Oral  SpO2: 100%  100%   Weight:      Height:        Intake/Output Summary (Last 24 hours) at 07/19/2019 1400 Last data filed at 07/19/2019 0600 Gross per 24 hour  Intake 4.72 ml  Output 650 ml  Net -645.28 ml   Last 3 Weights 07/19/2019 07/18/2019 07/17/2019  Weight (lbs) 219 lb 5.7 oz 218 lb 14.7 oz 218 lb 14.7 oz  Weight (kg) 99.5 kg 99.3 kg 99.3 kg      Telemetry    A fib with RVR rate 130 - Personally Reviewed  ECG    No new - Personally Reviewed  Physical Exam  Exam per Dr. Margaretann Loveless    Labs    High Sensitivity Troponin:  No results for input(s): TROPONINIHS in the last 720 hours.    Chemistry Recent Labs  Lab 07/17/19 0500 07/18/19 0603 07/19/19 0315  NA 133* 132* 134*  K 3.7 3.6 3.6  CL 101 99 102  CO2 25 24 24    GLUCOSE 184* 196* 174*  BUN 19 18 18   CREATININE 1.08 1.13 1.05  CALCIUM 8.0* 7.6* 7.9*  PROT  --  5.7* 5.7*  ALBUMIN  --  2.0* 2.1*  AST  --  40 43*  ALT  --  29 33  ALKPHOS  --  105 112  BILITOT  --  0.5 0.6  GFRNONAA >60 >60 >60  GFRAA >60 >60 >60  ANIONGAP 7 9 8      Hematology Recent Labs  Lab 07/17/19 0500 07/18/19 0603 07/19/19 0315  WBC 10.7* 8.8 9.0  RBC 2.72* 2.81* 2.77*  HGB 7.9* 8.1* 8.0*  HCT 25.1* 25.6* 25.1*  MCV 92.3 91.1 90.6  MCH 29.0 28.8 28.9  MCHC 31.5 31.6 31.9  RDW 18.3* 17.9* 18.0*  PLT 173 175 200    BNPNo results for input(s): BNP, PROBNP in the last 168 hours.   DDimer No results for input(s): DDIMER in the last 168 hours.   Radiology    No results found.  Cardiac Studies   LIMITED ECHO 07/17/19 IMPRESSIONS    1. Left ventricular ejection fraction, by estimation, is 55-60%. The left  ventricle has normal function. Left ventricular  endocardial border not  optimally defined to evaluate regional wall motion even with definity  contrast. There is severe left  ventricular hypertrophy of the basal-septal segment. Left ventricular  diastolic parameters are indeterminate.  2. Right ventricular systolic function was not well visualized. The right  ventricular size is normal. There is normal pulmonary artery systolic  pressure. The estimated right ventricular systolic pressure is 00.3 mmHg.  3. The mitral valve is normal in structure. No evidence of mitral valve  regurgitation. No evidence of mitral stenosis.  4. The aortic valve is tricuspid. Aortic valve regurgitation is trivial.  Mild aortic valve sclerosis is present, with no evidence of aortic valve  stenosis.  5. Aortic dilatation noted. There is mild dilatation at the level of the  sinuses of Valsalva measuring 43 mm.  6. The inferior vena cava is dilated in size with <50% respiratory  variability, suggesting right atrial pressure of 15 mmHg.   FINDINGS  Left Ventricle:  Left ventricular ejection fraction, by estimation, is 55  to 60%. The left ventricle has normal function. Left ventricular  endocardial border not optimally defined to evaluate regional wall motion.  The left ventricular internal cavity  size was normal in size. There is severe left ventricular hypertrophy of  the basal-septal segment.   Right Ventricle: The right ventricular size is normal. No increase in  right ventricular wall thickness. Right ventricular systolic function was  not well visualized. There is normal pulmonary artery systolic pressure.  The tricuspid regurgitant velocity is  1.36 m/s, and with an assumed right atrial pressure of 15 mmHg, the  estimated right ventricular systolic pressure is 70.4 mmHg.   Left Atrium: Left atrial size was normal in size.   Right Atrium: Right atrial size was normal in size.   Pericardium: There is no evidence of pericardial effusion.   Mitral Valve: The mitral valve is normal in structure. Normal mobility of  the mitral valve leaflets. No evidence of mitral valve stenosis.   Tricuspid Valve: The tricuspid valve is normal in structure. Tricuspid  valve regurgitation is trivial. No evidence of tricuspid stenosis.   Aortic Valve: The aortic valve is tricuspid. Aortic valve regurgitation is  trivial. Mild aortic valve sclerosis is present, with no evidence of  aortic valve stenosis.   Pulmonic Valve: The pulmonic valve was normal in structure. Pulmonic valve  regurgitation is not visualized. No evidence of pulmonic stenosis.   Aorta: Aortic dilatation noted. There is mild dilatation at the level of  the sinuses of Valsalva measuring 43 mm.   Venous: The inferior vena cava is dilated in size with less than 50%  respiratory variability, suggesting right atrial pressure of 15 mmHg.   IAS/Shunts: No atrial level shunt detected by color flow Doppler.    Echo 04/28/19 IMPRESSIONS    1. Difficult to assess LV systolic function  given tachycardia and poor  windows, but appears mildly to moderately reduced. Left ventricular  ejection fraction, by estimation, is 40 to 45%. Would consider repeat TTE  with contrast once heart rate better  controlled. There is moderate left ventricular hypertrophy. Left  ventricular diastolic parameters are indeterminate.  2. Right ventricle is poorly visualized, but function appears moderately  reduced  3. The mitral valve is normal in structure. No evidence of mitral valve  regurgitation.  4. The aortic valve is tricuspid. Aortic valve regurgitation is not  visualized. No aortic stenosis is present.  5. Aortic dilatation noted. 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  6. The inferior vena cava is dilated in size with <50% respiratory  variability, suggesting right atrial pressure of 15 mmHg.   FINDINGS  Left Ventricle: Left ventricular ejection fraction, by estimation, is 40  to 45%. The left ventricle has mildly decreased function. The left  ventricle demonstrates global hypokinesis. The left ventricular internal  cavity size was small. There is moderate  left ventricular hypertrophy. Left ventricular diastolic parameters are  indeterminate.   Right Ventricle: The right ventricular size is not well visualized. Right  vetricular wall thickness was not assessed. Right ventricular systolic  function is moderately reduced.   Left Atrium: Left atrial size was not well visualized.   Right Atrium: Right atrial size was not well visualized.   Pericardium: Trivial pericardial effusion is present.   Mitral Valve: The mitral valve is normal in structure. No evidence of  mitral valve regurgitation.   Tricuspid Valve: The tricuspid valve is normal in structure. Tricuspid  valve regurgitation is trivial.   Aortic Valve: The aortic valve is tricuspid. Aortic valve regurgitation is  not visualized. No aortic stenosis is present.   Pulmonic Valve: The  pulmonic valve was not well visualized. Pulmonic valve  regurgitation is not visualized.   Aorta: Aortic dilatation noted. 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   Venous: The inferior vena cava is dilated in size with less than 50%  respiratory variability, suggesting right atrial pressure of 15 mmHg.   IAS/Shunts: The interatrial septum was not well visualized.    Patient Profile     69 y.o. male with a hx of ASCAD s/p CABG in 2017, PersistentAF, chronic systolic CHF, DM, Thoracic aortic aneurysm, throat/tongue CA and metastatic colorectal CAseen in consult for RVR in his persistent a fib due to new neoplasm of sigmoid colon  Assessment & Plan    1. Persistentatrial flutter/fibrillation now with RVR  BB  po (outpt he was 100 BID) HR up 130s to 140s at times on po dig, and lopressor 25 mg BID.  Rec'd IV lopressor duering the night for RVR.  Not on po dilt.   possible begin po amiodarone defer to Dr. Margaretann Loveless.  --anticoagulation per Dr. Theodosia Blender note "Dr. [name] and he says that patient is very high risk for ongoing rectal bleeding >>the hospitalist started Eliquis despite this being stopped in April>>I will discontinue the Eliquis"   2. ASCAD -s/p remote CABG -denies any anginal sx -PO BB -currently not on statin therapy -no ASA due to bleeding issues with colon  3. Chronic systolic CHF -EF 40-10% on echo 04/2019 >> now appears grossly preserved with significant beat to beat variability. And EF 55-60%.  -PRN diuresis -now since admit negative 2488 though wt is stable since the 9th  4. Malignant neoplasm of sigmoid colon -per oncology -S/P 4 cycles of FOLFOX - colonic obstruction post stentNG tube out - port a cath access       For questions or updates, please contact [place] Please consult [email or web address] for contact info under        Signed, Cecilie Kicks, NP  07/19/2019, 2:00 PM     Patient seen and examined with  Cecilie Kicks NP.  Agree as above, with the following exceptions and changes as noted below. He continues to have rapid rates in atrial fibrillation, with slow rates overnight which limits our use of diltiazem orally, this has happened before which is why dilt was stopped. Gen: NAD, CV: tachycardic and irregular, no  murmurs, Lungs: clear, Abd: soft, Extrem: Warm, well perfused, no edema, Neuro/Psych: alert and oriented x 3, normal mood and affect. All available labs, radiology testing, previous records reviewed.   Rate control has been quite challenging, complicated by bradycardia overnight. Continue metoprolol tartrate 50 mg BID. Now off of dilt IV but did not tolerate dilt po.  He is on digoxin 0.125 mg daily. His level is 0.4. This can likely be increased. He is not very ambulatory at this point, though he did ambulate with significant assistance today and I witnessed this. We will increase digoxin to 0.25 mg daily, and monitor levels. Could consider digoxin 6 days a week to avoid toxicity at home. Renal function acceptable.   I do not know if we will get much rate control benefit from amiodarone, and run a risk of cardioversion chemically while not on anticoagulation, though this can be considered if indicated.   If rate control not achieved with digoxin uptitration, consider increasing BB further.  Elouise Munroe, MD 07/19/19 5:15 PM

## 2019-07-19 NOTE — TOC Progression Note (Signed)
Transition of Care (TOC) - Progression Note    Patient Details  Name: Clarence Andrews. MRN: [id number] Date of Birth: 08-13-1950  Transition of Care [facility]) CM/SW Contact  Leeroy Cha, RN Phone Number: 07/19/2019, 3:04 PM  Clinical Narrative:    Four snf names given to patient.  Wants to go one in [place] will fax out fl2 to the [place] area.   Expected Discharge Plan: [place] Barriers to Discharge: Continued Medical Work up  Expected Discharge Plan and Services Expected Discharge Plan: [place] Choice: [place] arrangements for the past 2 months: Single Family Home                                       Social Determinants of Health (SDOH) Interventions    Readmission Risk Interventions No flowsheet data found.

## 2019-07-19 NOTE — Progress Notes (Signed)
Occupational Therapy Treatment Patient Details Name: Clarence Andrews. MRN: [id number] DOB: 06-27-1950 Today's Date: 07/19/2019    History of present illness 69 y.o. male with a history of CAD s/p CABG, persistent atrial fibrillation/flutter not on anticoagulation secondary to rectal bleeding, chronic systolic heart failure, previous COVID-19 infection, metastatic colon cancer. Patient presented from his oncology office secondary to atrial fibrillation with RVR. While admitted he developed significant nausea and vomiting with CT evidence of obstruction of his sigmoid from is colonic mass   OT comments  Pt immediately agreeable to working with therapies. Demonstrated improvement in general strength and endurance during bed mobility and ambulation, but continues to need significant assistance for sit to stand and chair follow. Pt demonstrated ability to don front opening gown and socks with min assist. Pt with hypotension, but asymptomatic. Continue to recommend SNF for further rehab as pt lives alone.  Follow Up Recommendations  SNF    Equipment Recommendations  3 in 1 bedside commode    Recommendations for Other Services      Precautions / Restrictions Precautions Precautions: Fall Precaution Comments: monitor HR       Mobility Bed Mobility Overal bed mobility: Needs Assistance Bed Mobility: Rolling;Sidelying to Sit Rolling: Min assist Sidelying to sit: Mod assist       General bed mobility comments: rolling to perform pericare (BM in bed); assist to sit upright for trunk and for assisting LEs.  Transfers Overall transfer level: Needs assistance Equipment used: Rolling walker (2 wheeled) Transfers: Sit to/from Stand Sit to Stand: +2 physical assistance;Mod assist         General transfer comment: assist to rise and steady and transition hands from chair to RW with sit to stand, cues for hand placement and min assist to control descent to chair    Balance Overall  balance assessment: Needs assistance   Sitting balance-Leahy Scale: Good Sitting balance - Comments: no LOB while donning socks   Standing balance support: Bilateral upper extremity supported;During functional activity Standing balance-Leahy Scale: Poor Standing balance comment: BUE on walker.                           ADL either performed or assessed with clinical judgement   ADL Overall ADL's : Needs assistance/impaired                 Upper Body Dressing : Minimal assistance;Sitting Upper Body Dressing Details (indicate cue type and reason): front opening gown Lower Body Dressing: Minimal assistance;Sitting/lateral leans Lower Body Dressing Details (indicate cue type and reason): socks, although pt does not typically wear socks     Toileting- Clothing Manipulation and Hygiene: Total assistance;Bed level Toileting - Clothing Manipulation Details (indicate cue type and reason): pt with bowel incontinence      Functional mobility during ADLs: Minimal assistance;+2 for physical assistance;Rolling walker       Vision       Perception     Praxis      Cognition Arousal/Alertness: Awake/alert Behavior During Therapy: WFL for tasks assessed/performed Overall Cognitive Status: Within Functional Limits for tasks assessed                                          Exercises     Shoulder Instructions       General Comments      Pertinent Vitals/ Pain  Pain Assessment: Faces Faces Pain Scale: Hurts a little bit Pain Location: right knee Pain Descriptors / Indicators: Discomfort Pain Intervention(s): Monitored during session  Home Living                                          Prior Functioning/Environment              Frequency  Min 2X/week        Progress Toward Goals  OT Goals(current goals can now be found in the care plan section)  Progress towards OT goals: Progressing toward goals  Acute  Rehab OT Goals Patient Stated Goal: To go home OT Goal Formulation: With patient Time For Goal Achievement: 07/31/19 Potential to Achieve Goals: Good  Plan Discharge plan remains appropriate    Co-evaluation    PT/OT/SLP Co-Evaluation/Treatment: Yes Reason for Co-Treatment: For patient/therapist safety;To address functional/ADL transfers PT goals addressed during session: Mobility/safety with mobility OT goals addressed during session: ADL's and self-care      AM-PAC OT "6 Clicks" Daily Activity     Outcome Measure   Help from another person eating meals?: None Help from another person taking care of personal grooming?: A Little Help from another person toileting, which includes using toliet, bedpan, or urinal?: Total Help from another person bathing (including washing, rinsing, drying)?: A Lot Help from another person to put on and taking off regular upper body clothing?: A Little Help from another person to put on and taking off regular lower body clothing?: A Lot 6 Click Score: 15    End of Session Equipment Utilized During Treatment: Gait belt;Rolling walker  OT Visit Diagnosis: Unsteadiness on feet (R26.81);Muscle weakness (generalized) (M62.81)   Activity Tolerance Patient tolerated treatment well   Patient Left in chair;with call bell/phone within reach   Nurse Communication Mobility status        Time: 3546-5681 OT Time Calculation (min): 33 min  Charges: OT General Charges $OT Visit: 1 Visit OT Treatments $Self Care/Home Management : 8-22 mins  Nestor Lewandowsky, OTR/L Acute Rehabilitation Services Pager: [phone] Office: [phone]   Malka So 07/19/2019, 1:33 PM

## 2019-07-20 ENCOUNTER — Other Ambulatory Visit: Payer: Self-pay | Admitting: Oncology

## 2019-07-20 LAB — CBC WITH DIFFERENTIAL/PLATELET
Abs Immature Granulocytes: 0.04 10*3/uL (ref 0.00–0.07)
Basophils Absolute: 0.1 10*3/uL (ref 0.0–0.1)
Basophils Relative: 1 %
Eosinophils Absolute: 0.1 10*3/uL (ref 0.0–0.5)
Eosinophils Relative: 2 %
HCT: 25.9 % — ABNORMAL LOW (ref 39.0–52.0)
Hemoglobin: 8.1 g/dL — ABNORMAL LOW (ref 13.0–17.0)
Immature Granulocytes: 1 %
Lymphocytes Relative: 10 %
Lymphs Abs: 0.9 10*3/uL (ref 0.7–4.0)
MCH: 28.8 pg (ref 26.0–34.0)
MCHC: 31.3 g/dL (ref 30.0–36.0)
MCV: 92.2 fL (ref 80.0–100.0)
Monocytes Absolute: 1.4 10*3/uL — ABNORMAL HIGH (ref 0.1–1.0)
Monocytes Relative: 16 %
Neutro Abs: 6.3 10*3/uL (ref 1.7–7.7)
Neutrophils Relative %: 70 %
Platelets: 235 10*3/uL (ref 150–400)
RBC: 2.81 MIL/uL — ABNORMAL LOW (ref 4.22–5.81)
RDW: 18 % — ABNORMAL HIGH (ref 11.5–15.5)
WBC: 8.7 10*3/uL (ref 4.0–10.5)
nRBC: 0 % (ref 0.0–0.2)

## 2019-07-20 LAB — COMPREHENSIVE METABOLIC PANEL
ALT: 30 U/L (ref 0–44)
AST: 30 U/L (ref 15–41)
Albumin: 2.2 g/dL — ABNORMAL LOW (ref 3.5–5.0)
Alkaline Phosphatase: 126 U/L (ref 38–126)
Anion gap: 6 (ref 5–15)
BUN: 19 mg/dL (ref 8–23)
CO2: 27 mmol/L (ref 22–32)
Calcium: 8.1 mg/dL — ABNORMAL LOW (ref 8.9–10.3)
Chloride: 101 mmol/L (ref 98–111)
Creatinine, Ser: 1.15 mg/dL (ref 0.61–1.24)
GFR calc Af Amer: >60 mL/min (ref >60)
GFR calc non Af Amer: >60 mL/min (ref >60)
Glucose, Bld: 180 mg/dL — ABNORMAL HIGH (ref 70–99)
Potassium: 3.4 mmol/L — ABNORMAL LOW (ref 3.5–5.1)
Sodium: 134 mmol/L — ABNORMAL LOW (ref 135–145)
Total Bilirubin: 0.5 mg/dL (ref 0.3–1.2)
Total Protein: 6 g/dL — ABNORMAL LOW (ref 6.5–8.1)

## 2019-07-20 LAB — GLUCOSE, CAPILLARY
Glucose-Capillary: 209 mg/dL — ABNORMAL HIGH (ref 70–99)
Glucose-Capillary: 237 mg/dL — ABNORMAL HIGH (ref 70–99)
Glucose-Capillary: 270 mg/dL — ABNORMAL HIGH (ref 70–99)
Glucose-Capillary: 217 mg/dL — ABNORMAL HIGH (ref 70–99)

## 2019-07-20 LAB — MAGNESIUM: Magnesium: 1.4 mg/dL — ABNORMAL LOW (ref 1.7–2.4)

## 2019-07-20 LAB — PHOSPHORUS: Phosphorus: 2.4 mg/dL — ABNORMAL LOW (ref 2.5–4.6)

## 2019-07-20 MED ORDER — POTASSIUM CHLORIDE CRYS ER 20 MEQ PO TBCR
40.0000 meq | EXTENDED_RELEASE_TABLET | Freq: Once | ORAL | Status: AC
  Administered 2019-07-20: 40 meq via ORAL
  Filled 2019-07-20: qty 2

## 2019-07-20 MED ORDER — MAGNESIUM SULFATE 4 GM/100ML IV SOLN
4.0000 g | Freq: Once | INTRAVENOUS | Status: AC
  Administered 2019-07-20: 4 g via INTRAVENOUS
  Filled 2019-07-20: qty 100

## 2019-07-20 MED ORDER — METOPROLOL TARTRATE 25 MG PO TABS
25.0000 mg | ORAL_TABLET | Freq: Once | ORAL | Status: AC
  Administered 2019-07-20: 25 mg via ORAL
  Filled 2019-07-20: qty 1

## 2019-07-20 MED ORDER — METOPROLOL TARTRATE 25 MG PO TABS
75.0000 mg | ORAL_TABLET | Freq: Two times a day (BID) | ORAL | Status: DC
  Administered 2019-07-20: 75 mg via ORAL
  Filled 2019-07-20 (×2): qty 3

## 2019-07-20 MED ORDER — INSULIN GLARGINE 100 UNIT/ML ~~LOC~~ SOLN
10.0000 [IU] | Freq: Every day | SUBCUTANEOUS | Status: DC
  Administered 2019-07-20 – 2019-07-23 (×4): 10 [IU] via SUBCUTANEOUS
  Filled 2019-07-20 (×4): qty 0.1

## 2019-07-20 NOTE — Progress Notes (Signed)
Progress Note  Patient Name: Clarence Andrews. Date of Encounter: 07/20/2019  [facility] Cardiologist: Mertie Moores, MD   Subjective   Denies any palpitations or lightheadedness.  Denies dyspnea.  Remains in atrial flutter with rate 100-130s.  BP 137/89.  Inpatient Medications    Scheduled Meds: . Chlorhexidine Gluconate Cloth  6 each Topical Q0600  . digoxin  0.25 mg Oral Daily  . diltiazem  30 mg Oral Once  . docusate sodium  200 mg Oral BID  . insulin aspart  0-15 Units Subcutaneous TID WC  . insulin aspart  0-5 Units Subcutaneous QHS  . metoprolol tartrate  50 mg Oral BID  . pantoprazole (PROTONIX) IV  40 mg Intravenous QHS  . sodium chloride flush  10-40 mL Intracatheter Q12H   Continuous Infusions:  PRN Meds: alum & mag hydroxide-simeth, fentaNYL (SUBLIMAZE) injection, levalbuterol, liver oil-zinc oxide, ondansetron **OR** ondansetron (ZOFRAN) IV, phenol, polyethylene glycol, sodium chloride flush   Vital Signs    Vitals:   07/20/19 0300 07/20/19 0400 07/20/19 0500 07/20/19 0600  BP: 124/81 129/81 (!) 130/91 137/89  Pulse: (!) 134  (!) 135 (!) 134  Resp: 19 (!) 22 (!) 21 (!) 24  Temp:  98.8 F (37.1 C)    TempSrc:  Axillary    SpO2: 98% 98% 96% 99%  Weight:      Height:        Intake/Output Summary (Last 24 hours) at 07/20/2019 0808 Last data filed at 07/19/2019 1852 Gross per 24 hour  Intake --  Output 350 ml  Net -350 ml   Last 3 Weights 07/19/2019 07/18/2019 07/17/2019  Weight (lbs) 219 lb 5.7 oz 218 lb 14.7 oz 218 lb 14.7 oz  Weight (kg) 99.5 kg 99.3 kg 99.3 kg      Telemetry    AFL with rate 130s, occasional variable conduction with rate down to 100s - Personally Reviewed  ECG    No new - Personally Reviewed  Physical Exam   GEN: no acute distress Neck:No JVD Cardiac:tachycardic no murmurs Respiratory:CTAB IN:OMVE, nondistended MS:No edema Neuro:Nonfocal  Psych: Normal affect   Labs    High Sensitivity Troponin:  No  results for input(s): TROPONINIHS in the last 720 hours.    Chemistry Recent Labs  Lab 07/18/19 0603 07/19/19 0315 07/20/19 0300  NA 132* 134* 134*  K 3.6 3.6 3.4*  CL 99 102 101  CO2 24 24 27   GLUCOSE 196* 174* 180*  BUN 18 18 19   CREATININE 1.13 1.05 1.15  CALCIUM 7.6* 7.9* 8.1*  PROT 5.7* 5.7* 6.0*  ALBUMIN 2.0* 2.1* 2.2*  AST 40 43* 30  ALT 29 33 30  ALKPHOS 105 112 126  BILITOT 0.5 0.6 0.5  GFRNONAA >60 >60 >60  GFRAA >60 >60 >60  ANIONGAP 9 8 6      Hematology Recent Labs  Lab 07/18/19 0603 07/19/19 0315 07/20/19 0300  WBC 8.8 9.0 8.7  RBC 2.81* 2.77* 2.81*  HGB 8.1* 8.0* 8.1*  HCT 25.6* 25.1* 25.9*  MCV 91.1 90.6 92.2  MCH 28.8 28.9 28.8  MCHC 31.6 31.9 31.3  RDW 17.9* 18.0* 18.0*  PLT 175 200 235    BNPNo results for input(s): BNP, PROBNP in the last 168 hours.   DDimer No results for input(s): DDIMER in the last 168 hours.   Radiology    No results found.  Cardiac Studies   LIMITED ECHO 07/17/19 IMPRESSIONS    1. Left ventricular ejection fraction, by estimation, is 55-60%. The left  ventricle has normal function. Left ventricular endocardial border not  optimally defined to evaluate regional wall motion even with definity  contrast. There is severe left  ventricular hypertrophy of the basal-septal segment. Left ventricular  diastolic parameters are indeterminate.  2. Right ventricular systolic function was not well visualized. The right  ventricular size is normal. There is normal pulmonary artery systolic  pressure. The estimated right ventricular systolic pressure is 30.8 mmHg.  3. The mitral valve is normal in structure. No evidence of mitral valve  regurgitation. No evidence of mitral stenosis.  4. The aortic valve is tricuspid. Aortic valve regurgitation is trivial.  Mild aortic valve sclerosis is present, with no evidence of aortic valve  stenosis.  5. Aortic dilatation noted. There is mild dilatation at the level of the   sinuses of Valsalva measuring 43 mm.  6. The inferior vena cava is dilated in size with <50% respiratory  variability, suggesting right atrial pressure of 15 mmHg.   FINDINGS  Left Ventricle: Left ventricular ejection fraction, by estimation, is 55  to 60%. The left ventricle has normal function. Left ventricular  endocardial border not optimally defined to evaluate regional wall motion.  The left ventricular internal cavity  size was normal in size. There is severe left ventricular hypertrophy of  the basal-septal segment.   Right Ventricle: The right ventricular size is normal. No increase in  right ventricular wall thickness. Right ventricular systolic function was  not well visualized. There is normal pulmonary artery systolic pressure.  The tricuspid regurgitant velocity is  1.36 m/s, and with an assumed right atrial pressure of 15 mmHg, the  estimated right ventricular systolic pressure is 65.7 mmHg.   Left Atrium: Left atrial size was normal in size.   Right Atrium: Right atrial size was normal in size.   Pericardium: There is no evidence of pericardial effusion.   Mitral Valve: The mitral valve is normal in structure. Normal mobility of  the mitral valve leaflets. No evidence of mitral valve stenosis.   Tricuspid Valve: The tricuspid valve is normal in structure. Tricuspid  valve regurgitation is trivial. No evidence of tricuspid stenosis.   Aortic Valve: The aortic valve is tricuspid. Aortic valve regurgitation is  trivial. Mild aortic valve sclerosis is present, with no evidence of  aortic valve stenosis.   Pulmonic Valve: The pulmonic valve was normal in structure. Pulmonic valve  regurgitation is not visualized. No evidence of pulmonic stenosis.   Aorta: Aortic dilatation noted. There is mild dilatation at the level of  the sinuses of Valsalva measuring 43 mm.   Venous: The inferior vena cava is dilated in size with less than 50%  respiratory variability,  suggesting right atrial pressure of 15 mmHg.   IAS/Shunts: No atrial level shunt detected by color flow Doppler.    Echo 04/28/19 IMPRESSIONS    1. Difficult to assess LV systolic function given tachycardia and poor  windows, but appears mildly to moderately reduced. Left ventricular  ejection fraction, by estimation, is 40 to 45%. Would consider repeat TTE  with contrast once heart rate better  controlled. There is moderate left ventricular hypertrophy. Left  ventricular diastolic parameters are indeterminate.  2. Right ventricle is poorly visualized, but function appears moderately  reduced  3. The mitral valve is normal in structure. No evidence of mitral valve  regurgitation.  4. The aortic valve is tricuspid. Aortic valve regurgitation is not  visualized. No aortic stenosis is present.  5. Aortic dilatation noted. 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  6. The inferior vena cava is dilated in size with <50% respiratory  variability, suggesting right atrial pressure of 15 mmHg.   FINDINGS  Left Ventricle: Left ventricular ejection fraction, by estimation, is 40  to 45%. The left ventricle has mildly decreased function. The left  ventricle demonstrates global hypokinesis. The left ventricular internal  cavity size was small. There is moderate  left ventricular hypertrophy. Left ventricular diastolic parameters are  indeterminate.   Right Ventricle: The right ventricular size is not well visualized. Right  vetricular wall thickness was not assessed. Right ventricular systolic  function is moderately reduced.   Left Atrium: Left atrial size was not well visualized.   Right Atrium: Right atrial size was not well visualized.   Pericardium: Trivial pericardial effusion is present.   Mitral Valve: The mitral valve is normal in structure. No evidence of  mitral valve regurgitation.   Tricuspid Valve: The tricuspid valve is normal in structure.  Tricuspid  valve regurgitation is trivial.   Aortic Valve: The aortic valve is tricuspid. Aortic valve regurgitation is  not visualized. No aortic stenosis is present.   Pulmonic Valve: The pulmonic valve was not well visualized. Pulmonic valve  regurgitation is not visualized.   Aorta: Aortic dilatation noted. 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   Venous: The inferior vena cava is dilated in size with less than 50%  respiratory variability, suggesting right atrial pressure of 15 mmHg.   IAS/Shunts: The interatrial septum was not well visualized.    Patient Profile     69 y.o. male with a hx of ASCAD s/p CABG in 2017, PersistentAF, chronic systolic CHF, DM, Thoracic aortic aneurysm, throat/tongue CA and metastatic colorectal CAseen in consult for RVR in his persistent a fib due to new neoplasm of sigmoid colon  Assessment & Plan    Persistentatrial flutter/fibrillation now with RVR -BB  po (outpt he was 100 BID) HR up 130s to 140s at times on po dig, and lopressor 50 mg BID.  Rate control complicated by bradycardiac overnight, PO diltiazem was discontinued -has not been on anticoagulation due to GI bleeding risk with metastatic colorectal cancer -Continue to titrate up metoprolol, will increase to 75 mg BID.  CAD -s/p remote CABG -denies any anginal sx -PO BB -currently not on statin therapy -no ASA due to bleeding issues with colon   Chronic systolic CHF -EF 88-28% on echo 04/2019 >> now appears grossly preserved with significant beat to beat variability and EF 55-60%.  -PRN diuresis   Malignant neoplasm of sigmoid colon -per oncology -S/P 4 cycles of FOLFOX - colonic obstruction post stentNG tube out - port a cath access  Hypokalemia/hypomagnesemia K 3.4, Mag 1.4 today.  Repletion per primary team, goal K>4, Mag>2   For questions or updates, please contact [place] Please consult [email or web address] for contact info under         Signed, Donato Heinz, MD  07/20/2019, 8:08 AM

## 2019-07-20 NOTE — Progress Notes (Signed)
PROGRESS NOTE    Clarence Andrews.  [name] DOB: September 01, 1950 DOA: 07/09/2019 PCP: Patient, No Pcp Per   Chief Complaint  Patient presents with   Weakness   Brief Narrative: Clarence Andrews. is Clarence Andrews 69 y.o. male with Clarence Andrews history of CAD s/p CABG, persistent atrial fibrillation/flutter not on anticoagulation secondary to rectal bleeding, chronic systolic heart failure, previous COVID-19 infection, metastatic colon cancer. Patient presented from his oncology office secondary to atrial fibrillation with RVR. While admitted he developed significant nausea and vomiting with CT evidence of obstruction of his sigmoid from is colonic mass.  Assessment & Plan:   Principal Problem:   Atrial fibrillation with RVR ([facility]) Active Problems:   Cancer of left colon (HCC)   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HCC)   Type 2 diabetes mellitus without complication (HCC)   CAD (coronary artery disease)   Chronic systolic heart failure (HCC)   Large bowel obstruction (HCC)  Persistent atrial flutter/fibrillation with RVR Patient with Clarence Andrews history of atrial fibrillation as an outpatient on metoprolol 100 mg BID and digoxin 0.125 mg daily.  Bradycardia noted yesterday, 48-50 -> diltiazem d/c'd Continues to be tachycardic to 120s-30s Metoprolol increased to 75 mg BID Digoxin increased to 0.25 Repeat echo with EF 55-60%, severe LVH of basal septal segment (see report) Cardiology c/s, appreciate recs Not on anticoagulation due to risk of rectal bleeding   Hypokalemia   Hypomagnesemia: replace and follow  Large bowel obstruction Patient with no bowel movement in 5 days prior to admission. No prior history of obstruction but has had abdominal surgery in the past. History of adenocarcinoma of the colon as mentioned below. Abdominal x-ray (6/8) significant for possible ileus. CT abdomen/pelvis (6/9) obtained and significant for severe/obstructing malignant stricture at sigmoid colon. General surgery and GI  consulted on 6/9. Patient felt better after NG tube placement. Having bowel movements after colonic stent placed which was significant for completely obstructing colonic mass. -GI recommendations: advance diet as able; stent placed 6/11 -General surgery recommendations pending. Signed off, outpatient follow-up  Leukocytosis Improved, continue to monitor  Adenocarcinoma of the colon Metastatic adenocarcinoma to liver Recently diagnosed. Currently follows with medical oncology, Dr. [name] and is on active chemotherapy treatment.  Chronic systolic heart failure EF of 40-45%. Patient is euvolemic on admission. On metoprolol (Lopressor) as an outpatient. Patient is not on diuretic therapy. Rales noted on exam but chest x-ray without evidence of effusion or edema.  - 6/16 EF 55-60%, severe LVH (see report) -Watch fluid status; daily weights and strict in/out  History of CABG CAD Patient was previously on aspirin which was discontinued secondary to rectal bleeding.  Diabetes mellitus, type 2 Patient is on glipizide as an outpatient. Last hemoglobin A1C of 7.8%.  -Continue SSI  AKI on  CKD stage II Baseline creatinine of 1.2. Peak creatinine of 1.5 which is now back to baseline with IV fluids. Resolved.  Tick Bite: tick found on L chest 6/16, removed with tweezer.  Follow for rash.  Given 200 mg doxycycline x1 for ppx.  Therapy recommending SNF.  Pt resistant to this idea, but I think it's unsafe for discharge home as he has no support at home.  He was initially agreeable to SNF, but today is asking again about going home to pay bills.  I don't think he's strong enough to go home independently and his HR continues to be uncontrolled.  Discussed with nursing, will discuss with [facility] team to see if any options (he's concerned about paying  his bills).  DVT prophylaxis: SCD Code Status: full  Family Communication: none at bedside Disposition:   Status is: Inpatient  Remains inpatient  appropriate because:Inpatient level of care appropriate due to severity of illness   Dispo: The patient is from: Home              Anticipated d/c is to: pending              Anticipated d/c date is: 3 days              Patient currently is not medically stable to d/c.   Consultants:   Cardiology  Procedures:  Echo IMPRESSIONS    1. Left ventricular ejection fraction, by estimation, is 55-60%. The left  ventricle has normal function. Left ventricular endocardial border not  optimally defined to evaluate regional wall motion even with definity  contrast. There is severe left  ventricular hypertrophy of the basal-septal segment. Left ventricular  diastolic parameters are indeterminate.  2. Right ventricular systolic function was not well visualized. The right  ventricular size is normal. There is normal pulmonary artery systolic  pressure. The estimated right ventricular systolic pressure is 79.3 mmHg.  3. The mitral valve is normal in structure. No evidence of mitral valve  regurgitation. No evidence of mitral stenosis.  4. The aortic valve is tricuspid. Aortic valve regurgitation is trivial.  Mild aortic valve sclerosis is present, with no evidence of aortic valve  stenosis.  5. Aortic dilatation noted. There is mild dilatation at the level of the  sinuses of Valsalva measuring 43 mm.  6. The inferior vena cava is dilated in size with <50% respiratory  variability, suggesting right atrial pressure of 15 mmHg.   Impression - Preparation of the colon was fair but inadequate for appropriate screening purposes but not for diagnostic/therapeutic intention.. - Hemorrhoids found on digital rectal exam. - Malignant completely obstructing tumor from 17 to 20 cm proximal to the anus. - Dilated in the sigmoid colon proximal to the obstructing colonic mass. - Wire placed beyond the stenosis, area injected with radiographic contrast to delineate stenosis further. Prosthesis  placed. Recommendations - The patient will be observed post-procedure, until all discharge criteria are met. - Patient to the PACU for recovery. - Return patient to hospital ward for ongoing care thereafter. - Observe clinical status. - May trial sips of clear liquids the rest of today but maintain NGT in place but can try to cap for now. - If patient tolerates the sips and KUB tomorrow (already ordered) looks to have enteral stent still in good position, then would allow clear liquids to advance to full liquids throughout Saturday. On Sunday, if patient is doing well, then may transition to soft/pureed foods for next 48 hours. Then may transition to Clarence Andrews low-residue diet moving forward. Stop all fiber supplementation. - Recommend that patient be initiated on Colace 200 mg 1-2 times daily on Saturday. Would also recommend Miralax 1-2 capfuls daily in effort of trying to optimize stool consistency and not allow stent to become clogged in the coming days. - Stent should fully expand over the next 2-weeks. - The findings and recommendations were discussed with the patient. - The findings and recommendations were discussed with the referring physician. - The findings and recommendations were discussed with the Hospital Team.  Antimicrobials:  Anti-infectives (From admission, onward)   Start     Dose/Rate Route Frequency Ordered Stop   07/17/19 1430  doxycycline (VIBRA-TABS) tablet 200 mg  200 mg Oral  Once 07/17/19 1419 07/17/19 1637     Subjective: No new complaints  Objective: Vitals:   07/20/19 1056 07/20/19 1100 07/20/19 1103 07/20/19 1200  BP: 121/87  132/84 133/82  Pulse: (!) 135 (!) 141 (!) 140 (!) 131  Resp: 20 12 15 18   Temp:      TempSrc:      SpO2: 100% 100% 98% 100%  Weight:      Height:        Intake/Output Summary (Last 24 hours) at 07/20/2019 1225 Last data filed at 07/20/2019 0847 Gross per 24 hour  Intake --  Output 351 ml  Net -351 ml   Filed Weights    07/17/19 0500 07/18/19 0500 07/19/19 0327  Weight: 99.3 kg 99.3 kg 99.5 kg    Examination:  General: No acute distress. Cardiovascular: tachy, irreg irreg Lungs: Clear to auscultation bilaterally  Abdomen: Soft, nontender, nondistended Neurological: Alert and oriented 3. Moves all extremities 4 . Cranial nerves II through XII grossly intact. Skin: Warm and dry. No rashes or lesions. Extremities: No clubbing or cyanosis. No edema.   Data Reviewed: I have personally reviewed following labs and imaging studies  CBC: Recent Labs  Lab 07/16/19 0548 07/17/19 0500 07/18/19 0603 07/19/19 0315 07/20/19 0300  WBC 14.0* 10.7* 8.8 9.0 8.7  NEUTROABS  --   --  6.2 6.6 6.3  HGB 8.1* 7.9* 8.1* 8.0* 8.1*  HCT 25.3* 25.1* 25.6* 25.1* 25.9*  MCV 90.4 92.3 91.1 90.6 92.2  PLT 171 173 175 200 343    Basic Metabolic Panel: Recent Labs  Lab 07/16/19 0548 07/17/19 0500 07/18/19 0603 07/19/19 0315 07/20/19 0300  NA 132* 133* 132* 134* 134*  K 3.4* 3.7 3.6 3.6 3.4*  CL 100 101 99 102 101  CO2 23 25 24 24 27   GLUCOSE 144* 184* 196* 174* 180*  BUN 17 19 18 18 19   CREATININE 1.01 1.08 1.13 1.05 1.15  CALCIUM 7.6* 8.0* 7.6* 7.9* 8.1*  MG 1.3* 1.6* 1.6* 1.7 1.4*  PHOS  --   --  2.3* 2.3* 2.4*    GFR: Estimated Creatinine Clearance: 74.3 mL/min (by C-G formula based on SCr of 1.15 mg/dL).  Liver Function Tests: Recent Labs  Lab 07/18/19 0603 07/19/19 0315 07/20/19 0300  AST 40 43* 30  ALT 29 33 30  ALKPHOS 105 112 126  BILITOT 0.5 0.6 0.5  PROT 5.7* 5.7* 6.0*  ALBUMIN 2.0* 2.1* 2.2*    CBG: Recent Labs  Lab 07/19/19 1215 07/19/19 1632 07/19/19 2107 07/20/19 0807 07/20/19 1216  GLUCAP 245* 185* 210* 209* 237*     No results found for this or any previous visit (from the past 240 hour(s)).       Radiology Studies: No results found.      Scheduled Meds:  Chlorhexidine Gluconate Cloth  6 each Topical Q0600   digoxin  0.25 mg Oral Daily    diltiazem  30 mg Oral Once   docusate sodium  200 mg Oral BID   insulin aspart  0-15 Units Subcutaneous TID WC   insulin aspart  0-5 Units Subcutaneous QHS   metoprolol tartrate  75 mg Oral BID   pantoprazole (PROTONIX) IV  40 mg Intravenous QHS   sodium chloride flush  10-40 mL Intracatheter Q12H   Continuous Infusions:  magnesium sulfate bolus IVPB 4 g (07/20/19 1053)     LOS: 11 days    Time spent: over 30 min    Fayrene Helper, MD [facility]   To contact the attending provider between 7A-7P or the covering provider during after hours 7P-7A, please log into the web site [email or web address] and access using universal [place] password for that web site. If you do not have the password, please call the hospital operator.  07/20/2019, 12:25 PM

## 2019-07-21 LAB — CBC WITH DIFFERENTIAL/PLATELET
Abs Immature Granulocytes: 0.04 10*3/uL (ref 0.00–0.07)
Basophils Absolute: 0.1 10*3/uL (ref 0.0–0.1)
Basophils Relative: 1 %
Eosinophils Absolute: 0.2 10*3/uL (ref 0.0–0.5)
Eosinophils Relative: 2 %
HCT: 25.3 % — ABNORMAL LOW (ref 39.0–52.0)
Hemoglobin: 7.8 g/dL — ABNORMAL LOW (ref 13.0–17.0)
Immature Granulocytes: 0 %
Lymphocytes Relative: 10 %
Lymphs Abs: 0.9 10*3/uL (ref 0.7–4.0)
MCH: 28.1 pg (ref 26.0–34.0)
MCHC: 30.8 g/dL (ref 30.0–36.0)
MCV: 91 fL (ref 80.0–100.0)
Monocytes Absolute: 1.1 10*3/uL — ABNORMAL HIGH (ref 0.1–1.0)
Monocytes Relative: 12 %
Neutro Abs: 7 10*3/uL (ref 1.7–7.7)
Neutrophils Relative %: 75 %
Platelets: 252 10*3/uL (ref 150–400)
RBC: 2.78 MIL/uL — ABNORMAL LOW (ref 4.22–5.81)
RDW: 17.6 % — ABNORMAL HIGH (ref 11.5–15.5)
WBC: 9.3 10*3/uL (ref 4.0–10.5)
nRBC: 0 % (ref 0.0–0.2)

## 2019-07-21 LAB — COMPREHENSIVE METABOLIC PANEL
ALT: 32 U/L (ref 0–44)
AST: 36 U/L (ref 15–41)
Albumin: 2.1 g/dL — ABNORMAL LOW (ref 3.5–5.0)
Alkaline Phosphatase: 120 U/L (ref 38–126)
Anion gap: 4 — ABNORMAL LOW (ref 5–15)
BUN: 19 mg/dL (ref 8–23)
CO2: 25 mmol/L (ref 22–32)
Calcium: 7.9 mg/dL — ABNORMAL LOW (ref 8.9–10.3)
Chloride: 103 mmol/L (ref 98–111)
Creatinine, Ser: 0.99 mg/dL (ref 0.61–1.24)
GFR calc Af Amer: >60 mL/min (ref >60)
GFR calc non Af Amer: >60 mL/min (ref >60)
Glucose, Bld: 225 mg/dL — ABNORMAL HIGH (ref 70–99)
Potassium: 3.8 mmol/L (ref 3.5–5.1)
Sodium: 132 mmol/L — ABNORMAL LOW (ref 135–145)
Total Bilirubin: 0.3 mg/dL (ref 0.3–1.2)
Total Protein: 5.7 g/dL — ABNORMAL LOW (ref 6.5–8.1)

## 2019-07-21 LAB — PHOSPHORUS: Phosphorus: 2.1 mg/dL — ABNORMAL LOW (ref 2.5–4.6)

## 2019-07-21 LAB — GLUCOSE, CAPILLARY
Glucose-Capillary: 170 mg/dL — ABNORMAL HIGH (ref 70–99)
Glucose-Capillary: 206 mg/dL — ABNORMAL HIGH (ref 70–99)
Glucose-Capillary: 222 mg/dL — ABNORMAL HIGH (ref 70–99)
Glucose-Capillary: 184 mg/dL — ABNORMAL HIGH (ref 70–99)

## 2019-07-21 LAB — MAGNESIUM: Magnesium: 1.7 mg/dL (ref 1.7–2.4)

## 2019-07-21 MED ORDER — POTASSIUM CHLORIDE CRYS ER 20 MEQ PO TBCR
40.0000 meq | EXTENDED_RELEASE_TABLET | Freq: Once | ORAL | Status: AC
  Administered 2019-07-21: 40 meq via ORAL
  Filled 2019-07-21: qty 2

## 2019-07-21 MED ORDER — METOPROLOL TARTRATE 50 MG PO TABS
100.0000 mg | ORAL_TABLET | Freq: Two times a day (BID) | ORAL | Status: DC
  Administered 2019-07-21 – 2019-07-22 (×3): 100 mg via ORAL
  Filled 2019-07-21 (×2): qty 2
  Filled 2019-07-21: qty 4

## 2019-07-21 MED ORDER — SODIUM CHLORIDE 0.9 % IV SOLN: INTRAVENOUS | Status: DC

## 2019-07-21 MED ORDER — LACTATED RINGERS IV BOLUS
500.0000 mL | Freq: Once | INTRAVENOUS | Status: AC
  Administered 2019-07-21: 500 mL via INTRAVENOUS

## 2019-07-21 MED ORDER — MAGNESIUM SULFATE 2 GM/50ML IV SOLN
2.0000 g | Freq: Once | INTRAVENOUS | Status: AC
  Administered 2019-07-21: 2 g via INTRAVENOUS
  Filled 2019-07-21: qty 50

## 2019-07-21 MED ORDER — PANTOPRAZOLE SODIUM 40 MG PO TBEC
40.0000 mg | DELAYED_RELEASE_TABLET | Freq: Every day | ORAL | Status: DC
  Administered 2019-07-21 – 2019-07-23 (×3): 40 mg via ORAL
  Filled 2019-07-21 (×3): qty 1

## 2019-07-21 NOTE — Progress Notes (Signed)
This note also relates to the following rows which could not be included: ECG Heart Rate - Cannot attach notes to unvalidated device data Resp - Cannot attach notes to unvalidated device data    07/21/19 2100  Assess: MEWS Score  Temp 99 F (37.2 C)  BP 122/88  Pulse Rate (!) 103  Level of Consciousness Alert  SpO2 100 %  O2 Device Room Air  Assess: MEWS Score  MEWS Temp 0  MEWS Systolic 0  MEWS Pulse 1  MEWS RR 1  MEWS LOC 0  MEWS Score 2  MEWS Score Color Yellow  Assess: if the MEWS score is Yellow or Red  Were vital signs taken at a resting state? Yes  Focused Assessment Documented focused assessment  Early Detection of Sepsis Score *See Row Information* Low  MEWS guidelines implemented *See Row Information* No, previously yellow, continue vital signs every 4 hours  Escalate  MEWS: Escalate Yellow: discuss with charge nurse/RN and consider discussing with provider and RRT  Notify: Charge Nurse/RN  Name of Charge Nurse/RN Notified Sharyn Lull RN  Date Charge Nurse/RN Notified 07/21/19  Time Charge Nurse/RN Notified 2104  Nurse will continue to monitor pt and assess for any changes.

## 2019-07-21 NOTE — Progress Notes (Signed)
MD notified of orthostatic drop in blood pressure with ambulation with PT down to 53/38 while walking. 500cc bolus ordered and BP rechecked when patient transferred back to bed. Patient denied symptoms for duration of activity. Blood pressure improved to 211 systolic and heart rate decreased from 130 to 90 with bolus. MD made aware. Will continue to monitor.

## 2019-07-21 NOTE — Progress Notes (Signed)
Patient asking this RN to be discharged rather than transferred to telemetry. This RN explained that the cardiologist cleared him from his stand point but that the hospitalist will have to make the decision to discharge. This RN explained her concerns relating to needing help cleaning up, two person assist standing with last PT evaluation, etc. The patient feels that he can manage this at home with occasional help from his neighbor. PT paged to evaluate per Dr. Abel Presto request. Will continue to monitor.

## 2019-07-21 NOTE — Progress Notes (Signed)
Progress Note  Patient Name: Clarence Andrews. Date of Encounter: 07/21/2019  [facility] Cardiologist: Mertie Moores, MD   Subjective   Denies any chest pain or dyspnea.  Inpatient Medications    Scheduled Meds: . Chlorhexidine Gluconate Cloth  6 each Topical Q0600  . digoxin  0.25 mg Oral Daily  . diltiazem  30 mg Oral Once  . docusate sodium  200 mg Oral BID  . insulin aspart  0-15 Units Subcutaneous TID WC  . insulin aspart  0-5 Units Subcutaneous QHS  . insulin glargine  10 Units Subcutaneous QHS  . metoprolol tartrate  75 mg Oral BID  . pantoprazole (PROTONIX) IV  40 mg Intravenous QHS  . sodium chloride flush  10-40 mL Intracatheter Q12H   Continuous Infusions:  PRN Meds: alum & mag hydroxide-simeth, fentaNYL (SUBLIMAZE) injection, levalbuterol, liver oil-zinc oxide, ondansetron **OR** ondansetron (ZOFRAN) IV, phenol, polyethylene glycol, sodium chloride flush   Vital Signs    Vitals:   07/21/19 0300 07/21/19 0354 07/21/19 0400 07/21/19 0500  BP: (!) 144/70  (!) 134/57 (!) 126/100  Pulse: (!) 114  91 98  Resp: 15  16 17   Temp:   97.9 F (36.6 C)   TempSrc:   Oral   SpO2: 98%  98% 99%  Weight:  97.1 kg    Height:        Intake/Output Summary (Last 24 hours) at 07/21/2019 0634 Last data filed at 07/20/2019 2030 Gross per 24 hour  Intake --  Output 1401 ml  Net -1401 ml   Last 3 Weights 07/21/2019 07/19/2019 07/18/2019  Weight (lbs) 214 lb 1.1 oz 219 lb 5.7 oz 218 lb 14.7 oz  Weight (kg) 97.1 kg 99.5 kg 99.3 kg      Telemetry    AF/AFL with rates down to 60s overnight but up to 130s currently- Personally Reviewed  ECG    No new - Personally Reviewed  Physical Exam   GEN: no acute distress Neck:No JVD Cardiac:tachycardic no murmurs Respiratory:CTAB EN:IDPO, nondistended MS:No edema Neuro:Nonfocal  Psych: Normal affect   Labs    High Sensitivity Troponin:  No results for input(s): TROPONINIHS in the last 720 hours.     Chemistry Recent Labs  Lab 07/19/19 0315 07/20/19 0300 07/21/19 0340  NA 134* 134* 132*  K 3.6 3.4* 3.8  CL 102 101 103  CO2 24 27 25   GLUCOSE 174* 180* 225*  BUN 18 19 19   CREATININE 1.05 1.15 0.99  CALCIUM 7.9* 8.1* 7.9*  PROT 5.7* 6.0* 5.7*  ALBUMIN 2.1* 2.2* 2.1*  AST 43* 30 36  ALT 33 30 32  ALKPHOS 112 126 120  BILITOT 0.6 0.5 0.3  GFRNONAA >60 >60 >60  GFRAA >60 >60 >60  ANIONGAP 8 6 4*     Hematology Recent Labs  Lab 07/19/19 0315 07/20/19 0300 07/21/19 0340  WBC 9.0 8.7 9.3  RBC 2.77* 2.81* 2.78*  HGB 8.0* 8.1* 7.8*  HCT 25.1* 25.9* 25.3*  MCV 90.6 92.2 91.0  MCH 28.9 28.8 28.1  MCHC 31.9 31.3 30.8  RDW 18.0* 18.0* 17.6*  PLT 200 235 252    BNPNo results for input(s): BNP, PROBNP in the last 168 hours.   DDimer No results for input(s): DDIMER in the last 168 hours.   Radiology    No results found.  Cardiac Studies   LIMITED ECHO 07/17/19 IMPRESSIONS    1. Left ventricular ejection fraction, by estimation, is 55-60%. The left  ventricle has normal function. Left ventricular  endocardial border not  optimally defined to evaluate regional wall motion even with definity  contrast. There is severe left  ventricular hypertrophy of the basal-septal segment. Left ventricular  diastolic parameters are indeterminate.  2. Right ventricular systolic function was not well visualized. The right  ventricular size is normal. There is normal pulmonary artery systolic  pressure. The estimated right ventricular systolic pressure is 67.8 mmHg.  3. The mitral valve is normal in structure. No evidence of mitral valve  regurgitation. No evidence of mitral stenosis.  4. The aortic valve is tricuspid. Aortic valve regurgitation is trivial.  Mild aortic valve sclerosis is present, with no evidence of aortic valve  stenosis.  5. Aortic dilatation noted. There is mild dilatation at the level of the  sinuses of Valsalva measuring 43 mm.  6. The inferior  vena cava is dilated in size with <50% respiratory  variability, suggesting right atrial pressure of 15 mmHg.   FINDINGS  Left Ventricle: Left ventricular ejection fraction, by estimation, is 55  to 60%. The left ventricle has normal function. Left ventricular  endocardial border not optimally defined to evaluate regional wall motion.  The left ventricular internal cavity  size was normal in size. There is severe left ventricular hypertrophy of  the basal-septal segment.   Right Ventricle: The right ventricular size is normal. No increase in  right ventricular wall thickness. Right ventricular systolic function was  not well visualized. There is normal pulmonary artery systolic pressure.  The tricuspid regurgitant velocity is  1.36 m/s, and with an assumed right atrial pressure of 15 mmHg, the  estimated right ventricular systolic pressure is 93.8 mmHg.   Left Atrium: Left atrial size was normal in size.   Right Atrium: Right atrial size was normal in size.   Pericardium: There is no evidence of pericardial effusion.   Mitral Valve: The mitral valve is normal in structure. Normal mobility of  the mitral valve leaflets. No evidence of mitral valve stenosis.   Tricuspid Valve: The tricuspid valve is normal in structure. Tricuspid  valve regurgitation is trivial. No evidence of tricuspid stenosis.   Aortic Valve: The aortic valve is tricuspid. Aortic valve regurgitation is  trivial. Mild aortic valve sclerosis is present, with no evidence of  aortic valve stenosis.   Pulmonic Valve: The pulmonic valve was normal in structure. Pulmonic valve  regurgitation is not visualized. No evidence of pulmonic stenosis.   Aorta: Aortic dilatation noted. There is mild dilatation at the level of  the sinuses of Valsalva measuring 43 mm.   Venous: The inferior vena cava is dilated in size with less than 50%  respiratory variability, suggesting right atrial pressure of 15 mmHg.   IAS/Shunts:  No atrial level shunt detected by color flow Doppler.    Echo 04/28/19 IMPRESSIONS    1. Difficult to assess LV systolic function given tachycardia and poor  windows, but appears mildly to moderately reduced. Left ventricular  ejection fraction, by estimation, is 40 to 45%. Would consider repeat TTE  with contrast once heart rate better  controlled. There is moderate left ventricular hypertrophy. Left  ventricular diastolic parameters are indeterminate.  2. Right ventricle is poorly visualized, but function appears moderately  reduced  3. The mitral valve is normal in structure. No evidence of mitral valve  regurgitation.  4. The aortic valve is tricuspid. Aortic valve regurgitation is not  visualized. No aortic stenosis is present.  5. Aortic dilatation noted. 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  6. The inferior vena cava is dilated in size with <50% respiratory  variability, suggesting right atrial pressure of 15 mmHg.   FINDINGS  Left Ventricle: Left ventricular ejection fraction, by estimation, is 40  to 45%. The left ventricle has mildly decreased function. The left  ventricle demonstrates global hypokinesis. The left ventricular internal  cavity size was small. There is moderate  left ventricular hypertrophy. Left ventricular diastolic parameters are  indeterminate.   Right Ventricle: The right ventricular size is not well visualized. Right  vetricular wall thickness was not assessed. Right ventricular systolic  function is moderately reduced.   Left Atrium: Left atrial size was not well visualized.   Right Atrium: Right atrial size was not well visualized.   Pericardium: Trivial pericardial effusion is present.   Mitral Valve: The mitral valve is normal in structure. No evidence of  mitral valve regurgitation.   Tricuspid Valve: The tricuspid valve is normal in structure. Tricuspid  valve regurgitation is trivial.   Aortic  Valve: The aortic valve is tricuspid. Aortic valve regurgitation is  not visualized. No aortic stenosis is present.   Pulmonic Valve: The pulmonic valve was not well visualized. Pulmonic valve  regurgitation is not visualized.   Aorta: Aortic dilatation noted. 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   Venous: The inferior vena cava is dilated in size with less than 50%  respiratory variability, suggesting right atrial pressure of 15 mmHg.   IAS/Shunts: The interatrial septum was not well visualized.    Patient Profile     69 y.o. male with a hx of ASCAD s/p CABG in 2017, PersistentAF, chronic systolic CHF, DM, Thoracic aortic aneurysm, throat/tongue CA and metastatic colorectal CAseen in consult for RVR in his persistent a fib due to new neoplasm of sigmoid colon  Assessment & Plan    Persistentatrial flutter/fibrillation now with RVR -limited to rate control as unable to be on anticoagulation due to GI bleeding risk with metastatic colorectal cancer -rates appeared better controlled yesterday but now back to AFL with rates 130s.  Will increase to home metoprolol 100 mg BID and continue digoxin 0.25 mg.  Will need digoxin level checked later this week  CAD -s/p remote CABG -denies any anginal sx -PO BB -currently not on statin therapy -no ASA due to bleeding issues with colon   Chronic systolic CHF -EF 70-35% on echo 04/2019 >> now appears grossly preserved with significant beat to beat variability and EF 55-60%.  -PRN diuresis   Malignant neoplasm of sigmoid colon -per oncology -S/P 4 cycles of FOLFOX - colonic obstruction post stentNG tube out - port a cath access  Hypokalemia/hypomagnesemia K 3.8, Mag 1.7 today.  Repletion per primary team, goal K>4, Mag>2   For questions or updates, please contact [place] Please consult [email or web address] for contact info under        Signed, Donato Heinz, MD  07/21/2019, 6:34 AM

## 2019-07-21 NOTE — Progress Notes (Addendum)
Physical Therapy Treatment Patient Details Name: Clarence Andrews. MRN: [id number] DOB: 06/01/1950 Today's Date: 07/21/2019    History of Present Illness 69 y.o. male with a history of CAD s/p CABG, persistent atrial fibrillation/flutter not on anticoagulation secondary to rectal bleeding, chronic systolic heart failure, previous COVID-19 infection, metastatic colon cancer. Patient presented from his oncology office secondary to atrial fibrillation with RVR. While admitted he developed significant nausea and vomiting with CT evidence of obstruction of his sigmoid from is colonic mass    PT Comments    Pt very concerned regarding getting to bank and paying bills and stating he needs to go home and that his neighbor can assist him intermittently.  However, pt currently requires physical assist for safe performance of all basic mobility tasks and presents with orthostatic BP as follows; supine 119/75; sitting 109/74; stand 96/42; stand x 3 min 73/47 and after ambulating in hall 53/38.  Pt denies any dizziness or SOB - Rn present for session.  Follow Up Recommendations  SNF     Equipment Recommendations  None recommended by PT    Recommendations for Other Services       Precautions / Restrictions Precautions Precautions: Fall Precaution Comments: monitor HR Restrictions Weight Bearing Restrictions: No    Mobility  Bed Mobility Overal bed mobility: Needs Assistance Bed Mobility: Rolling;Sidelying to Sit Rolling: Min assist Sidelying to sit: Min assist;+2 for physical assistance       General bed mobility comments: rolling to perform pericare (BM in bed); assist to sit upright for trunk   Transfers Overall transfer level: Needs assistance Equipment used: Rolling walker (2 wheeled) Transfers: Sit to/from Stand Sit to Stand: +2 safety/equipment;+2 physical assistance;Min assist         General transfer comment: Pt requires assist to bring wt up and fwd.  Bed at lowest level -  pt advises his bed at home is even lower  Ambulation/Gait Ambulation/Gait assistance: Min assist;+2 safety/equipment Gait Distance (Feet): 220 Feet Assistive device: Rolling walker (2 wheeled) Gait Pattern/deviations: Step-through pattern;Trunk flexed;Decreased stride length Gait velocity: decr   General Gait Details: Patient provided cues for posture, position inside RW, and safety awareness. Max HR 133   Stairs             Wheelchair Mobility    Modified Rankin (Stroke Patients Only)       Balance Overall balance assessment: Needs assistance Sitting-balance support: Bilateral upper extremity supported;Feet supported Sitting balance-Leahy Scale: Good     Standing balance support: Bilateral upper extremity supported;During functional activity Standing balance-Leahy Scale: Fair Standing balance comment: BUE on walker.                            Cognition Arousal/Alertness: Awake/alert Behavior During Therapy: WFL for tasks assessed/performed;Flat affect Overall Cognitive Status: Within Functional Limits for tasks assessed                                 General Comments: does not acknowledge his limitations for mobility      Exercises      General Comments        Pertinent Vitals/Pain Pain Assessment: No/denies pain    Home Living                      Prior Function            PT Goals (  current goals can now be found in the care plan section) Acute Rehab PT Goals Patient Stated Goal: To go home PT Goal Formulation: With patient Time For Goal Achievement: 07/29/19 Potential to Achieve Goals: Fair Progress towards PT goals: Progressing toward goals    Frequency    Min 3X/week - pt states will refuse SNF     PT Plan Current plan remains appropriate    Co-evaluation PT/OT/SLP Co-Evaluation/Treatment: Yes Reason for Co-Treatment: For patient/therapist safety PT goals addressed during session:  Mobility/safety with mobility OT goals addressed during session: ADL's and self-care      AM-PAC PT "6 Clicks" Mobility   Outcome Measure  Help needed turning from your back to your side while in a flat bed without using bedrails?: A Little Help needed moving from lying on your back to sitting on the side of a flat bed without using bedrails?: A Little Help needed moving to and from a bed to a chair (including a wheelchair)?: A Little Help needed standing up from a chair using your arms (e.g., wheelchair or bedside chair)?: A Little Help needed to walk in hospital room?: A Little Help needed climbing 3-5 steps with a railing? : Total 6 Click Score: 16    End of Session Equipment Utilized During Treatment: Gait belt Activity Tolerance: Patient tolerated treatment well Patient left: in chair;with call bell/phone within reach;with chair alarm set;with nursing/sitter in room Nurse Communication: Mobility status PT Visit Diagnosis: Other abnormalities of gait and mobility (R26.89);Muscle weakness (generalized) (M62.81)     Time: 5726-2035 PT Time Calculation (min) (ACUTE ONLY): 27 min  Charges:  $Gait Training: 8-22 mins                     [place] Pager [phone] Office [phone]    Nelson Julson 07/21/2019, 12:24 PM

## 2019-07-21 NOTE — Progress Notes (Signed)
Patient adamant on leaving to get his debit card and cash to pay his rent. Patient not appropriate for discharge at this time from a medical and mobility standpoint. Patient is dependent for ADLS, ambulating, and incontinent. He sates he would like to leave and come back after paying bills. Discussed with patient need to remain at hospital for acute illness. Educated on recent orthostatic hypotension, decrease in hemoglobin,  and our concerns that he is unstable on his feet. Patient states he will be fine and wear diapers once home. Patient has no one to pick him up and he plans to walk out to his car and pay his rent and return. Patient unable to ambulate independently. Patient states he has no one to assist with ADLs, driving or errands. at this time. Patient declining notifying his son. Discussed concerns with MD, bedside RN, as well as house supervisor. Patient made aware his landlord can not evict him due to [place] mandate through July 30th. Patient states understanding but adamant he needs to leave. MD in route to further discuss.

## 2019-07-21 NOTE — Progress Notes (Signed)
Occupational Therapy Treatment Patient Details Name: Clarence Andrews. MRN: [id number] DOB: Jun 30, 1950 Today's Date: 07/21/2019    History of present illness 69 y.o. male with a history of CAD s/p CABG, persistent atrial fibrillation/flutter not on anticoagulation secondary to rectal bleeding, chronic systolic heart failure, previous COVID-19 infection, metastatic colon cancer. Patient presented from his oncology office secondary to atrial fibrillation with RVR. While admitted he developed significant nausea and vomiting with CT evidence of obstruction of his sigmoid from is colonic mass   OT comments  Patient is distressed being in hospital, reports needing to get to the bank to pay his bills "life goes on outside the hospital." Patient also stating has neighbor that can assist intermittently. Currently, patient requiring min A x2 for bed mobility and sit to stand from hospital bed, with patient reporting bed at home is even lower. Patient with orthostatic BP however reports asymptomatic (please see PT note for readings). Continue to recommend SNF at current level, however if patient refuses recommend [facility] services. Will continue to follow.   Follow Up Recommendations  SNF;Other (comment) (if patient declines rec HH)    Equipment Recommendations  3 in 1 bedside commode       Precautions / Restrictions Precautions Precautions: Fall Precaution Comments: monitor HR and BP Restrictions Weight Bearing Restrictions: No       Mobility Bed Mobility Overal bed mobility: Needs Assistance Bed Mobility: Rolling;Sidelying to Sit Rolling: Min assist Sidelying to sit: Min assist;+2 for physical assistance       General bed mobility comments: rolling to perform pericare (BM in bed); assist to sit upright for trunk   Transfers Overall transfer level: Needs assistance Equipment used: Rolling walker (2 wheeled) Transfers: Sit to/from Stand Sit to Stand: Min assist;+2 safety/equipment          General transfer comment: Pt requires assist to bring weight up and forward.  Bed at lowest level - pt advises his bed at home is even lower    Balance Overall balance assessment: Needs assistance Sitting-balance support: Bilateral upper extremity supported;Feet supported Sitting balance-Leahy Scale: Good     Standing balance support: Bilateral upper extremity supported;During functional activity Standing balance-Leahy Scale: Fair Standing balance comment: BUE on walker.                           ADL either performed or assessed with clinical judgement   ADL Overall ADL's : Needs assistance/impaired                     Lower Body Dressing: Set up;Bed level Lower Body Dressing Details (indicate cue type and reason): don socks using figure 4 method in bed Toilet Transfer: Minimal assistance;+2 for physical assistance;+2 for safety/equipment;Ambulation;Cueing for safety;BSC;RW Toilet Transfer Details (indicate cue type and reason): min A x2 for safety, patient orthostatic however asymptomatic  Toileting- Clothing Manipulation and Hygiene: Total assistance;Bed level Toileting - Clothing Manipulation Details (indicate cue type and reason): pt with bowel incontinence      Functional mobility during ADLs: Minimal assistance;+2 for safety/equipment;Rolling walker;Cueing for safety                 Cognition Arousal/Alertness: Awake/alert Behavior During Therapy: WFL for tasks assessed/performed;Flat affect Overall Cognitive Status: Within Functional Limits for tasks assessed  General Comments: does not acknowledge his limitations for mobility              General Comments patient + orthostatic hypotension however reports asymptomatic throughout, please see PT note for vitals    Pertinent Vitals/ Pain       Pain Assessment: No/denies pain         Frequency  Min 2X/week        Progress Toward  Goals  OT Goals(current goals can now be found in the care plan section)  Progress towards OT goals: Progressing toward goals  Acute Rehab OT Goals Patient Stated Goal: To go home OT Goal Formulation: With patient Time For Goal Achievement: 07/31/19 Potential to Achieve Goals: Good ADL Goals Pt Will Perform Upper Body Bathing: with modified independence;with supervision;sitting Pt Will Perform Lower Body Bathing: with modified independence;with supervision;sit to/from stand Pt Will Perform Upper Body Dressing: with modified independence Pt Will Perform Lower Body Dressing: with modified independence;sit to/from stand Pt Will Transfer to Toilet: with supervision;ambulating;regular height toilet Pt Will Perform Toileting - Clothing Manipulation and hygiene: sit to/from stand;with modified independence Pt/caregiver will Perform Home Exercise Program: Both right and left upper extremity;Increased strength  Plan Discharge plan remains appropriate    Co-evaluation    PT/OT/SLP Co-Evaluation/Treatment: Yes Reason for Co-Treatment: For patient/therapist safety;To address functional/ADL transfers PT goals addressed during session: Mobility/safety with mobility OT goals addressed during session: ADL's and self-care      AM-PAC OT "6 Clicks" Daily Activity     Outcome Measure   Help from another person eating meals?: None Help from another person taking care of personal grooming?: A Little Help from another person toileting, which includes using toliet, bedpan, or urinal?: A Lot Help from another person bathing (including washing, rinsing, drying)?: A Lot Help from another person to put on and taking off regular upper body clothing?: A Little Help from another person to put on and taking off regular lower body clothing?: A Lot 6 Click Score: 16    End of Session Equipment Utilized During Treatment: Gait belt;Rolling walker  OT Visit Diagnosis: Unsteadiness on feet (R26.81);Muscle  weakness (generalized) (M62.81)   Activity Tolerance Patient tolerated treatment well   Patient Left in chair;with call bell/phone within reach;with nursing/sitter in room   Nurse Communication Mobility status        Time: 9476-5465 OT Time Calculation (min): 28 min  Charges: OT General Charges $OT Visit: 1 Visit OT Treatments $Self Care/Home Management : 8-22 mins  Delbert Phenix OT Pager: [place] 07/21/2019, 2:42 PM

## 2019-07-21 NOTE — Progress Notes (Signed)
The patient is receiving Protonix by the intravenous route.  Based on criteria approved by the Pharmacy and [place], the medication is being converted to the equivalent oral dose form.  These criteria include: -No active GI bleeding -Able to tolerate diet of full liquids (or better) or tube feeding -Able to tolerate other medications by the oral or enteral route  If you have any questions about this conversion, please contact the Pharmacy Department (phone [phone]).  Thank you.   Royetta Asal, PharmD, BCPS 07/21/2019 9:05 AM

## 2019-07-21 NOTE — Progress Notes (Signed)
PROGRESS NOTE    Clarence Andrews.  DGL:[id number] DOB: Jun 23, 1950 DOA: 07/09/2019 PCP: Patient, No Pcp Per   Chief Complaint  Patient presents with  . Weakness   Brief Narrative: Clarence Cush. is Clarence Andrews 69 y.o. male with Marcus Groll history of CAD s/p CABG, persistent atrial fibrillation/flutter not on anticoagulation secondary to rectal bleeding, chronic systolic heart failure, previous COVID-19 infection, metastatic colon cancer. Patient presented from his oncology office secondary to atrial fibrillation with RVR. While admitted he developed significant nausea and vomiting with CT evidence of obstruction of his sigmoid from is colonic mass.  Assessment & Plan:   Principal Problem:   Atrial fibrillation with RVR ([place]) Active Problems:   Cancer of left colon (HCC)   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HCC)   Type 2 diabetes mellitus without complication (HCC)   CAD (coronary artery disease)   Chronic systolic heart failure (HCC)   Large bowel obstruction (HCC)  Persistent atrial flutter/fibrillation with RVR Patient with Maven Varelas history of atrial fibrillation as an outpatient on metoprolol 100 mg BID and digoxin 0.125 mg daily.  Continues to be tachycardic to 120s-30s Metoprolol increased to 100 mg BID Digoxin increased to 0.25 -> needs dig level later this week Repeat echo with EF 55-60%, severe LVH of basal septal segment (see report) Cardiology c/s, appreciate recs Not on anticoagulation due to risk of rectal bleeding   Hypokalemia  Hypomagnesemia: replace and follow.  Goal K>4, Mag>2.  Large bowel obstruction Patient with no bowel movement in 5 days prior to admission. No prior history of obstruction but has had abdominal surgery in the past. History of adenocarcinoma of the colon as mentioned below. Abdominal x-ray (6/8) significant for possible ileus. CT abdomen/pelvis (6/9) obtained and significant for severe/obstructing malignant stricture at sigmoid colon. General surgery and GI  consulted on 6/9. Patient felt better after NG tube placement. Having bowel movements after colonic stent placed which was significant for completely obstructing colonic mass. -GI recommendations: advance diet as able; stent placed 6/11 -General surgery recommendations pending. Signed off, outpatient follow-up  Leukocytosis Improved, continue to monitor  Adenocarcinoma of the colon Metastatic adenocarcinoma to liver Recently diagnosed. Currently follows with medical oncology, Dr. [name] and is on active chemotherapy treatment.  Chronic systolic heart failure EF of 40-45%. Patient is euvolemic on admission. On metoprolol (Lopressor) as an outpatient. Patient is not on diuretic therapy. Rales noted on exam but chest x-ray without evidence of effusion or edema.  - 6/16 EF 55-60%, severe LVH (see report) -Watch fluid status; daily weights and strict in/out  History of CABG CAD Patient was previously on aspirin which was discontinued secondary to rectal bleeding.  Diabetes mellitus, type 2 Patient is on glipizide as an outpatient. Last hemoglobin A1C of 7.8%.  -Continue SSI  AKI on  CKD stage II Baseline creatinine of 1.2. Peak creatinine of 1.5 which is now back to baseline with IV fluids. Resolved.  Tick Bite: tick found on L chest 6/16, removed with tweezer.  Follow for rash.  Given 200 mg doxycycline x1 for ppx.  Therapy recommending SNF.  Pt resistant to this idea, but I think it's unsafe for discharge home as he has no support at home.  He was initially agreeable to SNF, but today is asking again about going home to pay bills - again today.  I don't think he's strong enough to go home independently and his HR continues to be uncontrolled.  Difficult situation will discuss with nursing to see how he's doing  today and try to have therapy work with him to see how he's doing.  Last time he worked with therapy, he did better with ambulation, but still needed 2 person assist to stand  which makes me concerned about his ability to do well home alone.  DVT prophylaxis: SCD Code Status: full  Family Communication: none at bedside Disposition:   Status is: Inpatient  Remains inpatient appropriate because:Inpatient level of care appropriate due to severity of illness   Dispo: The patient is from: Home              Anticipated d/c is to: pending              Anticipated d/c date is: 3 days              Patient currently is not medically stable to d/c.   Consultants:   Cardiology  Procedures:  Echo IMPRESSIONS    1. Left ventricular ejection fraction, by estimation, is 55-60%. The left  ventricle has normal function. Left ventricular endocardial border not  optimally defined to evaluate regional wall motion even with definity  contrast. There is severe left  ventricular hypertrophy of the basal-septal segment. Left ventricular  diastolic parameters are indeterminate.  2. Right ventricular systolic function was not well visualized. The right  ventricular size is normal. There is normal pulmonary artery systolic  pressure. The estimated right ventricular systolic pressure is 16.1 mmHg.  3. The mitral valve is normal in structure. No evidence of mitral valve  regurgitation. No evidence of mitral stenosis.  4. The aortic valve is tricuspid. Aortic valve regurgitation is trivial.  Mild aortic valve sclerosis is present, with no evidence of aortic valve  stenosis.  5. Aortic dilatation noted. There is mild dilatation at the level of the  sinuses of Valsalva measuring 43 mm.  6. The inferior vena cava is dilated in size with <50% respiratory  variability, suggesting right atrial pressure of 15 mmHg.   Impression - Preparation of the colon was fair but inadequate for appropriate screening purposes but not for diagnostic/therapeutic intention.. - Hemorrhoids found on digital rectal exam. - Malignant completely obstructing tumor from 17 to 20 cm proximal to  the anus. - Dilated in the sigmoid colon proximal to the obstructing colonic mass. - Wire placed beyond the stenosis, area injected with radiographic contrast to delineate stenosis further. Prosthesis placed. Recommendations - The patient will be observed post-procedure, until all discharge criteria are met. - Patient to the PACU for recovery. - Return patient to hospital ward for ongoing care thereafter. - Observe clinical status. - May trial sips of clear liquids the rest of today but maintain NGT in place but can try to cap for now. - If patient tolerates the sips and KUB tomorrow (already ordered) looks to have enteral stent still in good position, then would allow clear liquids to advance to full liquids throughout Saturday. On Sunday, if patient is doing well, then may transition to soft/pureed foods for next 48 hours. Then may transition to Clarence Andrews low-residue diet moving forward. Stop all fiber supplementation. - Recommend that patient be initiated on Colace 200 mg 1-2 times daily on Saturday. Would also recommend Miralax 1-2 capfuls daily in effort of trying to optimize stool consistency and not allow stent to become clogged in the coming days. - Stent should fully expand over the next 2-weeks. - The findings and recommendations were discussed with the patient. - The findings and recommendations were discussed with the referring physician. -  The findings and recommendations were discussed with the Hospital Team.  Antimicrobials:  Anti-infectives (From admission, onward)   Start     Dose/Rate Route Frequency Ordered Stop   07/17/19 1430  doxycycline (VIBRA-TABS) tablet 200 mg        200 mg Oral  Once 07/17/19 1419 07/17/19 1637     Subjective: No new complaints today Asking to go home  Objective: Vitals:   07/21/19 0354 07/21/19 0400 07/21/19 0500 07/21/19 0823  BP:  (!) 134/57 (!) 126/100   Pulse:  91 98   Resp:  16 17   Temp:  97.9 F (36.6 C)  97.6 F (36.4 C)   TempSrc:  Oral  Oral  SpO2:  98% 99%   Weight: 97.1 kg     Height:        Intake/Output Summary (Last 24 hours) at 07/21/2019 0906 Last data filed at 07/20/2019 2030 Gross per 24 hour  Intake --  Output 1400 ml  Net -1400 ml   Filed Weights   07/18/19 0500 07/19/19 0327 07/21/19 0354  Weight: 99.3 kg 99.5 kg 97.1 kg    Examination:  General: No acute distress. Cardiovascular: Heart sounds show Cougar Imel regular rate, and rhythm Lungs: Clear to auscultation bilaterally w Abdomen: Soft, nontender, nondistended Neurological: Alert and oriented 3. Moves all extremities 4. Cranial nerves II through XII grossly intact. Skin: Warm and dry. No rashes or lesions. Extremities: No clubbing or cyanosis. No edema.     Data Reviewed: I have personally reviewed following labs and imaging studies  CBC: Recent Labs  Lab 07/17/19 0500 07/18/19 0603 07/19/19 0315 07/20/19 0300 07/21/19 0340  WBC 10.7* 8.8 9.0 8.7 9.3  NEUTROABS  --  6.2 6.6 6.3 7.0  HGB 7.9* 8.1* 8.0* 8.1* 7.8*  HCT 25.1* 25.6* 25.1* 25.9* 25.3*  MCV 92.3 91.1 90.6 92.2 91.0  PLT 173 175 200 235 470    Basic Metabolic Panel: Recent Labs  Lab 07/17/19 0500 07/18/19 0603 07/19/19 0315 07/20/19 0300 07/21/19 0340  NA 133* 132* 134* 134* 132*  K 3.7 3.6 3.6 3.4* 3.8  CL 101 99 102 101 103  CO2 _0 GLUCOSE 184* 196* 174* 180* 225*  BUN _1 CREATININE 1.08 1.13 1.05 1.15 0.99  CALCIUM 8.0* 7.6* 7.9* 8.1* 7.9*  MG 1.6* 1.6* 1.7 1.4* 1.7  PHOS  --  2.3* 2.3* 2.4* 2.1*    GFR: Estimated Creatinine Clearance: 79.6 mL/min (by C-G formula based on SCr of 0.99 mg/dL).  Liver Function Tests: Recent Labs  Lab 07/18/19 0603 07/19/19 0315 07/20/19 0300 07/21/19 0340  AST 40 43* 30 36  ALT 29 33 30 32  ALKPHOS 105 112 126 120  BILITOT 0.5 0.6 0.5 0.3  PROT 5.7* 5.7* 6.0* 5.7*  ALBUMIN 2.0* 2.1* 2.2* 2.1*    CBG: Recent Labs  Lab 07/19/19 2107 07/20/19 0807 07/20/19 1216  07/20/19 1612 07/20/19 2107  GLUCAP 210* 209* 237* 270* 217*     No results found for this or any previous visit (from the past 240 hour(s)).       Radiology Studies: No results found.      Scheduled Meds: . Chlorhexidine Gluconate Cloth  6 each Topical Q0600  . digoxin  0.25 mg Oral Daily  . diltiazem  30 mg Oral Once  . docusate sodium  200 mg Oral BID  . insulin aspart  0-15 Units Subcutaneous TID WC  . insulin aspart  0-5 Units  Subcutaneous QHS  . insulin glargine  10 Units Subcutaneous QHS  . metoprolol tartrate  100 mg Oral BID  . pantoprazole  40 mg Oral QHS  . sodium chloride flush  10-40 mL Intracatheter Q12H   Continuous Infusions:    LOS: 12 days    Time spent: over 30 min    Fayrene Helper, MD [facility]   To contact the attending provider between 7A-7P or the covering provider during after hours 7P-7A, please log into the web site [email or web address] and access using universal [place] password for that web site. If you do not have the password, please call the hospital operator.  07/21/2019, 9:06 AM

## 2019-07-22 DIAGNOSIS — C186 Malignant neoplasm of descending colon: Secondary | ICD-10-CM

## 2019-07-22 LAB — GLUCOSE, CAPILLARY
Glucose-Capillary: 178 mg/dL — ABNORMAL HIGH (ref 70–99)
Glucose-Capillary: 178 mg/dL — ABNORMAL HIGH (ref 70–99)
Glucose-Capillary: 179 mg/dL — ABNORMAL HIGH (ref 70–99)
Glucose-Capillary: 218 mg/dL — ABNORMAL HIGH (ref 70–99)
Glucose-Capillary: 206 mg/dL — ABNORMAL HIGH (ref 70–99)

## 2019-07-22 LAB — COMPREHENSIVE METABOLIC PANEL
ALT: 34 U/L (ref 0–44)
AST: 30 U/L (ref 15–41)
Albumin: 2.2 g/dL — ABNORMAL LOW (ref 3.5–5.0)
Alkaline Phosphatase: 133 U/L — ABNORMAL HIGH (ref 38–126)
Anion gap: 9 (ref 5–15)
BUN: 17 mg/dL (ref 8–23)
CO2: 25 mmol/L (ref 22–32)
Calcium: 8.2 mg/dL — ABNORMAL LOW (ref 8.9–10.3)
Chloride: 100 mmol/L (ref 98–111)
Creatinine, Ser: 0.9 mg/dL (ref 0.61–1.24)
GFR calc Af Amer: >60 mL/min (ref >60)
GFR calc non Af Amer: >60 mL/min (ref >60)
Glucose, Bld: 264 mg/dL — ABNORMAL HIGH (ref 70–99)
Potassium: 4.2 mmol/L (ref 3.5–5.1)
Sodium: 134 mmol/L — ABNORMAL LOW (ref 135–145)
Total Bilirubin: 0.7 mg/dL (ref 0.3–1.2)
Total Protein: 6.1 g/dL — ABNORMAL LOW (ref 6.5–8.1)

## 2019-07-22 LAB — CBC WITH DIFFERENTIAL/PLATELET
Abs Immature Granulocytes: 0.05 10*3/uL (ref 0.00–0.07)
Basophils Absolute: 0.1 10*3/uL (ref 0.0–0.1)
Basophils Relative: 1 %
Eosinophils Absolute: 0.1 10*3/uL (ref 0.0–0.5)
Eosinophils Relative: 1 %
HCT: 28 % — ABNORMAL LOW (ref 39.0–52.0)
Hemoglobin: 8.8 g/dL — ABNORMAL LOW (ref 13.0–17.0)
Immature Granulocytes: 1 %
Lymphocytes Relative: 11 %
Lymphs Abs: 0.9 10*3/uL (ref 0.7–4.0)
MCH: 28.6 pg (ref 26.0–34.0)
MCHC: 31.4 g/dL (ref 30.0–36.0)
MCV: 90.9 fL (ref 80.0–100.0)
Monocytes Absolute: 0.9 10*3/uL (ref 0.1–1.0)
Monocytes Relative: 11 %
Neutro Abs: 6.6 10*3/uL (ref 1.7–7.7)
Neutrophils Relative %: 75 %
Platelets: 303 10*3/uL (ref 150–400)
RBC: 3.08 MIL/uL — ABNORMAL LOW (ref 4.22–5.81)
RDW: 17.6 % — ABNORMAL HIGH (ref 11.5–15.5)
WBC: 8.6 10*3/uL (ref 4.0–10.5)
nRBC: 0 % (ref 0.0–0.2)

## 2019-07-22 LAB — MAGNESIUM: Magnesium: 1.6 mg/dL — ABNORMAL LOW (ref 1.7–2.4)

## 2019-07-22 LAB — PHOSPHORUS: Phosphorus: 2.3 mg/dL — ABNORMAL LOW (ref 2.5–4.6)

## 2019-07-22 MED ORDER — METOPROLOL TARTRATE 50 MG PO TABS
100.0000 mg | ORAL_TABLET | Freq: Two times a day (BID) | ORAL | Status: DC
  Administered 2019-07-22 – 2019-07-24 (×3): 100 mg via ORAL
  Filled 2019-07-22 (×4): qty 2

## 2019-07-22 MED ORDER — SODIUM CHLORIDE 0.9 % IV BOLUS
1000.0000 mL | Freq: Once | INTRAVENOUS | Status: AC
  Administered 2019-07-22: 1000 mL via INTRAVENOUS

## 2019-07-22 MED ORDER — MAGNESIUM SULFATE 2 GM/50ML IV SOLN
2.0000 g | Freq: Once | INTRAVENOUS | Status: AC
  Administered 2019-07-22: 2 g via INTRAVENOUS
  Filled 2019-07-22: qty 50

## 2019-07-22 MED ORDER — DILTIAZEM HCL 25 MG/5ML IV SOLN
15.0000 mg | Freq: Once | INTRAVENOUS | Status: DC
  Filled 2019-07-22: qty 5

## 2019-07-22 NOTE — Progress Notes (Signed)
Occupational Therapy Treatment Patient Details Name: Clarence Andrews. MRN: [id number] DOB: 11-May-1950 Today's Date: 07/22/2019    History of present illness 69 y.o. male with a history of CAD s/p CABG, persistent atrial fibrillation/flutter not on anticoagulation secondary to rectal bleeding, chronic systolic heart failure, previous COVID-19 infection, metastatic colon cancer. Patient presented from his oncology office secondary to atrial fibrillation with RVR. While admitted he developed significant nausea and vomiting with CT evidence of obstruction of his sigmoid from is colonic mass   OT comments  Pt adamant about going home.  Pts car is here- he feels he will be able to drive it home.  Do not feel this is a safe option. Pt refusing SNF or HH stating a neighbor can A.  Spoke with MD regarding session with pt.   Follow Up Recommendations  SNF;Other (comment);Home health OT (if patient declines rec [place])    Equipment Recommendations  3 in 1 bedside commode    Recommendations for Other Services      Precautions / Restrictions Precautions Precautions: Fall Precaution Comments: monitor HR and BP       Mobility Bed Mobility Overal bed mobility: Needs Assistance Bed Mobility: Rolling;Sidelying to Sit Rolling: Min assist Sidelying to sit: Min assist       General bed mobility comments: rolling to perform pericare (BM in bed); assist to sit upright for trunk   Transfers Overall transfer level: Needs assistance Equipment used: Rolling walker (2 wheeled) Transfers: Sit to/from [facility] Sit to Stand: Min assist Stand pivot transfers: Min assist                ADL either performed or assessed with clinical judgement   ADL Overall ADL's : Needs assistance/impaired                         Toilet Transfer: Minimal assistance;Ambulation;Cueing for safety;BSC;RW   Toileting- Clothing Manipulation and Hygiene: Minimal assistance;Sit to/from  stand;Cueing for safety;Cueing for compensatory techniques       Functional mobility during ADLs: Minimal assistance;Rolling walker;Cueing for safety       Vision Baseline Vision/History: No visual deficits            Cognition Arousal/Alertness: Awake/alert Behavior During Therapy: WFL for tasks assessed/performed;Flat affect Overall Cognitive Status: Within Functional Limits for tasks assessed                                 General Comments: does not acknowledge his limitations for mobility                   Pertinent Vitals/ Pain       Pain Assessment: No/denies pain         Frequency  Min 2X/week        Progress Toward Goals  OT Goals(current goals can now be found in the care plan section)  Progress towards OT goals: Progressing toward goals  Acute Rehab OT Goals Patient Stated Goal: To go home OT Goal Formulation: With patient Time For Goal Achievement: 07/31/19 Potential to Achieve Goals: Good  Plan Discharge plan remains appropriate       AM-PAC OT "6 Clicks" Daily Activity     Outcome Measure   Help from another person eating meals?: None Help from another person taking care of personal grooming?: A Little Help from another person toileting, which includes using toliet, bedpan, or urinal?:  A Lot Help from another person bathing (including washing, rinsing, drying)?: A Lot Help from another person to put on and taking off regular upper body clothing?: A Little Help from another person to put on and taking off regular lower body clothing?: A Lot 6 Click Score: 16    End of Session Equipment Utilized During Treatment: Gait belt;Rolling walker  OT Visit Diagnosis: Unsteadiness on feet (R26.81);Muscle weakness (generalized) (M62.81)   Activity Tolerance Patient tolerated treatment well   Patient Left in chair;with call bell/phone within reach;with nursing/sitter in room   Nurse Communication Mobility status        Time:  1300-1330 OT Time Calculation (min): 30 min  Charges: OT General Charges $OT Visit: 1 Visit OT Treatments $Therapeutic Activity: 23-37 mins  Kari Baars, [place] [phone] Office- [place], Edwena Felty D 07/22/2019, 2:18 PM

## 2019-07-22 NOTE — Progress Notes (Signed)
Physical Therapy Treatment Patient Details Name: Clarence Andrews. MRN: [id number] DOB: 11/20/1950 Today's Date: 07/22/2019    History of Present Illness 69 y.o. male with a history of CAD s/p CABG, persistent atrial fibrillation/flutter not on anticoagulation secondary to rectal bleeding, chronic systolic heart failure, previous COVID-19 infection, metastatic colon cancer. Patient presented from his oncology office secondary to atrial fibrillation with RVR. While admitted he developed significant nausea and vomiting with CT evidence of obstruction of his sigmoid from is colonic mass    PT Comments    Pt OOB in recliner "mad" insisting he needs to go home today.  HR was 135 while seated in recliner.  Pt stated he needs to get his mail so he can pay some bills.  Assisted with amb to bathroom pt was unaware he was incont of both urine and BM.  Assisted with hygiene twice and change of socks all while pt was telling me about his home situation and recent hospital stay due to [place].  General transfer comment: at first pt was min assist from elevated recliner to amb to bathroom and Mod assist to get up from toilet the first time but then after a second time due to uncontrolled stools pt required + 2 Total Assist off toilet.  Quickly assisted to a wheelchair and rolled back to assist back to bed + 2 Total Assist.   General Gait Details: pt was able to amb to bathroom 12 feet but with need for a walker and noted unsteadiness.  HR was 135 as pt was agitated and frustrated about his extended hospital stay.  Pt was UNABLE to amb out of the bathroom sur to Orthostatic Hypotensive event on toilet.  BP dropped to 66/39 with MAX c/o dizziness. "What happened? " asked pt.  Explained to him his BP dropped and in no way safe to D/C to home today.  He agreed. Pt calling his land lord to make arrangements for someone to bring his mail here to the hospital.   Follow Up Recommendations  SNF     Equipment  Recommendations  None recommended by PT    Recommendations for Other Services       Precautions / Restrictions Precautions Precautions: Fall Precaution Comments: monitor HR and BP Restrictions Weight Bearing Restrictions: No    Mobility  Bed Mobility Overal bed mobility: Needs Assistance Bed Mobility: Sit to Supine Rolling: Min assist Sidelying to sit: Min assist   Sit to supine: +2 for physical assistance;+2 for safety/equipment;Max assist   General bed mobility comments: + 2 assist back to bed due to orthostatic hypotension  Transfers Overall transfer level: Needs assistance Equipment used: Rolling walker (2 wheeled) Transfers: Sit to/from [facility] Sit to Stand: Min assist;Max assist;Mod assist;Total assist;+2 physical assistance;+2 safety/equipment Stand pivot transfers: Min assist       General transfer comment: at first pt was min assist from elevated recliner to amb to bathroom and Mod assist to get up from toilet the first time but then after a second time due to uncontrolled stools pt required + 2 Total Assist off toilet.  Ambulation/Gait Ambulation/Gait assistance: Min assist Gait Distance (Feet): 12 Feet Assistive device: Rolling walker (2 wheeled) Gait Pattern/deviations: Step-through pattern;Trunk flexed;Decreased stride length     General Gait Details: pt was able to amb to bathroom 12 feet but with need for a walker and noted unsteadiness.  HR was 135 as pt was agitated and frustrated about his extended hospital stay.  Pt was UNABLE to amb  out of the bathroom sur to Orthostatic Hypotensive event on toilet.  BP dropped to 66/39 with MAX c/o dizziness.   Stairs             Wheelchair Mobility    Modified Rankin (Stroke Patients Only)       Balance                                            Cognition Arousal/Alertness: Awake/alert Behavior During Therapy: WFL for tasks assessed/performed;Flat  affect Overall Cognitive Status: Within Functional Limits for tasks assessed                                 General Comments: pt wanting to go home today to get his mail because he is behind on some of his bills      Exercises      General Comments        Pertinent Vitals/Pain Pain Assessment: No/denies pain Pain Location: "bad" right knee    Home Living                      Prior Function            PT Goals (current goals can now be found in the care plan section) Acute Rehab PT Goals Patient Stated Goal: To go home Progress towards PT goals: Progressing toward goals    Frequency    Min 3X/week      PT Plan Current plan remains appropriate    Co-evaluation              AM-PAC PT "6 Clicks" Mobility   Outcome Measure  Help needed turning from your back to your side while in a flat bed without using bedrails?: A Lot Help needed moving from lying on your back to sitting on the side of a flat bed without using bedrails?: A Lot Help needed moving to and from a bed to a chair (including a wheelchair)?: A Lot   Help needed to walk in hospital room?: A Lot Help needed climbing 3-5 steps with a railing? : Total 6 Click Score: 9    End of Session Equipment Utilized During Treatment: Gait belt Activity Tolerance: Treatment limited secondary to medical complications (Comment) Patient left: in bed;with bed alarm set;with call bell/phone within reach Nurse Communication: Mobility status PT Visit Diagnosis: Other abnormalities of gait and mobility (R26.89);Muscle weakness (generalized) (M62.81)     Time: 1415-1500 PT Time Calculation (min) (ACUTE ONLY): 45 min  Charges:  $Gait Training: 8-22 mins $Therapeutic Activity: 23-37 mins                     {Patrick Sohm  PTA Acute  Rehabilitation Services Pager      [phone] Office      [phone]

## 2019-07-22 NOTE — TOC Progression Note (Signed)
Transition of Care (TOC) - Progression Note    Patient Details  Name: Clarence Andrews. MRN: [id number] Date of Birth: August 02, 1950  Transition of [facility]) CM/SW Contact  Purcell Mouton, RN Phone Number: 07/22/2019, 10:17 AM  Clinical Narrative:     Spoke with pt concerning SNF and HH. Pt states that he did not want to go SNF. Pt continued to state that he did not need HH because his neighbor comes over everyday. Pt continues with I am ready to go home today.    Expected Discharge Plan: [place] Barriers to Discharge: Continued Medical Work up  Expected Discharge Plan and Services Expected Discharge Plan: [place] Choice: [place] arrangements for the past 2 months: Single Family Home                                       Social Determinants of Health (SDOH) Interventions    Readmission Risk Interventions No flowsheet data found.

## 2019-07-22 NOTE — Progress Notes (Signed)
PHYSICAL THERAPY  Near Syncope episode in bathroom.  Pt did amb to bathroom at [place] and sat on toilet > 10 min due to continued loose uncontrolled  pudding like stools.  Pt c/o increased dizziness, pale in color.  BP while seated on [facility] 66/39.  Therapist unable to get pt up so called for + 2 assist.  Pt alert enough to rise from toilet to wheelchair to be rolled out of bathroom.  Assisted back to bed + 2 assist.  Supine BP 92/63 HR 84.  Slow to feel better.  Will notify RN.   Rica Koyanagi  PTA Acute  Rehabilitation Services Pager      [phone] Office      [phone]

## 2019-07-22 NOTE — Discharge Instructions (Signed)
IF you have cardiac issues call Dr. Elmarie Shiley office please so we can change appointment but if severe come back to hospital.  Your heart rate may race calling you to be very tired or short of breath.  Please call if you have problems

## 2019-07-22 NOTE — Progress Notes (Signed)
PROGRESS NOTE    Clarence Andrews.  AQT:[id number] DOB: August 16, 1950 DOA: 07/09/2019 PCP: Patient, No Pcp Per   Chief Complaint  Patient presents with  . Weakness   Brief Narrative: Clarence Andrews. is Clarence Andrews 69 y.o. male with Clarence Andrews history of CAD s/p CABG, persistent atrial fibrillation/flutter not on anticoagulation secondary to rectal bleeding, chronic systolic heart failure, previous COVID-19 infection, metastatic colon cancer. Patient presented from his oncology office secondary to atrial fibrillation with RVR. While admitted he developed significant nausea and vomiting with CT evidence of obstruction of his sigmoid from is colonic mass.  Assessment & Plan:   Principal Problem:   Atrial fibrillation with RVR ([place]) Active Problems:   Cancer of left colon (HCC)   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HCC)   Type 2 diabetes mellitus without complication (HCC)   CAD (coronary artery disease)   Chronic systolic heart failure (HCC)   Large bowel obstruction (HCC)  Presyncope  Orthostatic Hypotension:  2/2 hypovolemia with frequent stools or vagal event while having BM.  repeat bolus, BP improved after IVF and back to bed.  Holding parameters for metoprolol.  Repeat orthostatics in the morning.  He's having frequent stools.    Persistent atrial flutter/fibrillation with RVR Patient with Clarence Andrews history of atrial fibrillation as an outpatient on metoprolol 100 mg BID and digoxin 0.125 mg daily.  HR improved, but still some RVR at times Metoprolol increased to 100 mg BID Digoxin increased to 0.25 -> tomorrow AM per cards Repeat echo with EF 55-60%, severe LVH of basal septal segment (see report) Cardiology c/s, appreciate recs Not on anticoagulation due to risk of rectal bleeding   Frequent Stools: continue to monitor.  No abdominal pain.  No fevers.  Consider additional w/u.  Hypokalemia  Hypomagnesemia: replace and follow.  Goal K>4, Mag>2.  Large bowel obstruction Patient with no bowel  movement in 5 days prior to admission. No prior history of obstruction but has had abdominal surgery in the past. History of adenocarcinoma of the colon as mentioned below. Abdominal x-ray (6/8) significant for possible ileus. CT abdomen/pelvis (6/9) obtained and significant for severe/obstructing malignant stricture at sigmoid colon. General surgery and GI consulted on 6/9. Patient felt better after NG tube placement. Having bowel movements after colonic stent placed which was significant for completely obstructing colonic mass. -GI recommendations: advance diet as able; stent placed 6/11 -General surgery recommendations pending. Signed off, outpatient follow-up  Leukocytosis Improved, continue to monitor  Adenocarcinoma of the colon Metastatic adenocarcinoma to liver Recently diagnosed. Currently follows with medical oncology, Dr. Benay Andrews and is on active chemotherapy treatment.  Chronic systolic heart failure EF of 40-45%. Patient is euvolemic on admission. On metoprolol (Lopressor) as an outpatient. Patient is not on diuretic therapy. Rales noted on exam but chest x-ray without evidence of effusion or edema.  - 6/16 EF 55-60%, severe LVH (see report) -Watch fluid status; daily weights and strict in/out  History of CABG CAD Patient was previously on aspirin which was discontinued secondary to rectal bleeding.  Diabetes mellitus, type 2 Patient is on glipizide as an outpatient. Last hemoglobin A1C of 7.8%.  -Continue SSI  AKI on  CKD stage II Baseline creatinine of 1.2. Peak creatinine of 1.5 which is now back to baseline with IV fluids. Resolved.  Tick Bite: tick found on L chest 6/16, removed with tweezer.  Follow for rash.  Given 200 mg doxycycline x1 for ppx.  Therapy recommending SNF.  Pt resistant to this idea, but I think  it's unsafe for discharge home as he has no support at home.  He was initially agreeable to SNF, but is asking again about going home to pay bills -  again today.  I don't think he's strong enough to go home independently and his HR continues to be uncontrolled.  Had episode of presyncope and orthostatic hypotension again today which I think make discharge unsafe.    DVT prophylaxis: SCD Code Status: full  Family Communication: none at bedside Disposition:   Status is: Inpatient  Remains inpatient appropriate because:Inpatient level of care appropriate due to severity of illness   Dispo: The patient is from: Home              Anticipated d/c is to: pending              Anticipated d/c date is: 3 days              Patient currently is not medically stable to d/c.   Consultants:   Cardiology  Procedures:  Echo IMPRESSIONS    1. Left ventricular ejection fraction, by estimation, is 55-60%. The left  ventricle has normal function. Left ventricular endocardial border not  optimally defined to evaluate regional wall motion even with definity  contrast. There is severe left  ventricular hypertrophy of the basal-septal segment. Left ventricular  diastolic parameters are indeterminate.  2. Right ventricular systolic function was not well visualized. The right  ventricular size is normal. There is normal pulmonary artery systolic  pressure. The estimated right ventricular systolic pressure is 53.6 mmHg.  3. The mitral valve is normal in structure. No evidence of mitral valve  regurgitation. No evidence of mitral stenosis.  4. The aortic valve is tricuspid. Aortic valve regurgitation is trivial.  Mild aortic valve sclerosis is present, with no evidence of aortic valve  stenosis.  5. Aortic dilatation noted. There is mild dilatation at the level of the  sinuses of Valsalva measuring 43 mm.  6. The inferior vena cava is dilated in size with <50% respiratory  variability, suggesting right atrial pressure of 15 mmHg.   Impression - Preparation of the colon was fair but inadequate for appropriate screening purposes but not for  diagnostic/therapeutic intention.. - Hemorrhoids found on digital rectal exam. - Malignant completely obstructing tumor from 17 to 20 cm proximal to the anus. - Dilated in the sigmoid colon proximal to the obstructing colonic mass. - Wire placed beyond the stenosis, area injected with radiographic contrast to delineate stenosis further. Prosthesis placed. Recommendations - The patient will be observed post-procedure, until all discharge criteria are met. - Patient to the PACU for recovery. - Return patient to hospital ward for ongoing care thereafter. - Observe clinical status. - May trial sips of clear liquids the rest of today but maintain NGT in place but can try to cap for now. - If patient tolerates the sips and KUB tomorrow (already ordered) looks to have enteral stent still in good position, then would allow clear liquids to advance to full liquids throughout Saturday. On Sunday, if patient is doing well, then may transition to soft/pureed foods for next 48 hours. Then may transition to Reshard Guillet low-residue diet moving forward. Stop all fiber supplementation. - Recommend that patient be initiated on Colace 200 mg 1-2 times daily on Saturday. Would also recommend Miralax 1-2 capfuls daily in effort of trying to optimize stool consistency and not allow stent to become clogged in the coming days. - Stent should fully expand over the next  2-weeks. - The findings and recommendations were discussed with the patient. - The findings and recommendations were discussed with the referring physician. - The findings and recommendations were discussed with the Hospital Team.  Antimicrobials:  Anti-infectives (From admission, onward)   Start     Dose/Rate Route Frequency Ordered Stop   07/17/19 1430  doxycycline (VIBRA-TABS) tablet 200 mg        200 mg Oral  Once 07/17/19 1419 07/17/19 1637     Subjective: No new complaints Feels he's ready for home  Objective: Vitals:   07/22/19 0844  07/22/19 1315 07/22/19 1515 07/22/19 1725  BP: (!) 141/70 118/65 110/62 (!) 151/82  Pulse: 89 (!) 129 64 65  Resp: 18 18 18 18   Temp: 98.1 F (36.7 C)  98 F (36.7 C) 98.1 F (36.7 C)  TempSrc: Oral  Oral Oral  SpO2: 100%  100% 100%  Weight:      Height:        Intake/Output Summary (Last 24 hours) at 07/22/2019 1848 Last data filed at 07/22/2019 1451 Gross per 24 hour  Intake 314.05 ml  Output 1600 ml  Net -1285.95 ml   Filed Weights   07/19/19 0327 07/21/19 0354 07/22/19 0414  Weight: 99.5 kg 97.1 kg 97.9 kg    Examination:  General: No acute distress. Cardiovascular: irreg irregu Lungs: Clear to auscultation bilaterally Abdomen: Soft, nontender, nondistended Neurological: Alert and oriented 3. Moves all extremities 4. Cranial nerves II through XII grossly intact. Skin: Warm and dry. No rashes or lesions. Extremities: No clubbing or cyanosis. No edema.  Seen working with therapy, stands with some help     Data Reviewed: I have personally reviewed following labs and imaging studies  CBC: Recent Labs  Lab 07/18/19 0603 07/19/19 0315 07/20/19 0300 07/21/19 0340 07/22/19 1316  WBC 8.8 9.0 8.7 9.3 8.6  NEUTROABS 6.2 6.6 6.3 7.0 6.6  HGB 8.1* 8.0* 8.1* 7.8* 8.8*  HCT 25.6* 25.1* 25.9* 25.3* 28.0*  MCV 91.1 90.6 92.2 91.0 90.9  PLT 175 200 235 252 790    Basic Metabolic Panel: Recent Labs  Lab 07/18/19 0603 07/19/19 0315 07/20/19 0300 07/21/19 0340 07/22/19 1316  NA 132* 134* 134* 132* 134*  K 3.6 3.6 3.4* 3.8 4.2  CL 99 102 101 103 100  CO2 24 24 27 25 25   GLUCOSE 196* 174* 180* 225* 264*  BUN 18 18 19 19 17   CREATININE 1.13 1.05 1.15 0.99 0.90  CALCIUM 7.6* 7.9* 8.1* 7.9* 8.2*  MG 1.6* 1.7 1.4* 1.7 1.6*  PHOS 2.3* 2.3* 2.4* 2.1* 2.3*    GFR: Estimated Creatinine Clearance: 87.5 mL/min (by C-G formula based on SCr of 0.9 mg/dL).  Liver Function Tests: Recent Labs  Lab 07/18/19 0603 07/19/19 0315 07/20/19 0300 07/21/19 0340  07/22/19 1316  AST 40 43* 30 36 30  ALT 29 33 30 32 34  ALKPHOS 105 112 126 120 133*  BILITOT 0.5 0.6 0.5 0.3 0.7  PROT 5.7* 5.7* 6.0* 5.7* 6.1*  ALBUMIN 2.0* 2.1* 2.2* 2.1* 2.2*    CBG: Recent Labs  Lab 07/21/19 2201 07/22/19 0238 07/22/19 0730 07/22/19 1247 07/22/19 1645  GLUCAP 184* 178* 178* 179* 218*     No results found for this or any previous visit (from the past 240 hour(s)).       Radiology Studies: No results found.      Scheduled Meds: . Chlorhexidine Gluconate Cloth  6 each Topical Q0600  . digoxin  0.25 mg Oral Daily  .  diltiazem  15 mg Intravenous Once  . diltiazem  30 mg Oral Once  . docusate sodium  200 mg Oral BID  . insulin aspart  0-15 Units Subcutaneous TID WC  . insulin aspart  0-5 Units Subcutaneous QHS  . insulin glargine  10 Units Subcutaneous QHS  . metoprolol tartrate  100 mg Oral BID  . pantoprazole  40 mg Oral QHS  . sodium chloride flush  10-40 mL Intracatheter Q12H   Continuous Infusions: . sodium chloride 10 mL/hr at 07/22/19 0200     LOS: 13 days    Time spent: over 30 min    Fayrene Helper, MD [facility]   To contact the attending provider between 7A-7P or the covering provider during after hours 7P-7A, please log into the web site [email or web address] and access using universal [place] password for that web site. If you do not have the password, please call the hospital operator.  07/22/2019, 6:48 PM

## 2019-07-22 NOTE — Progress Notes (Signed)
This note also relates to the following rows which could not be included: ECG Heart Rate - Cannot attach notes to unvalidated device data Resp - Cannot attach notes to unvalidated device data    07/22/19 0100  Assess: MEWS Score  Temp 97.7 F (36.5 C)  BP (!) 119/91  Pulse Rate (!) 133  Level of Consciousness Alert  Assess: MEWS Score  MEWS Temp 0  MEWS Systolic 0  MEWS Pulse 3  MEWS RR 1  MEWS LOC 0  MEWS Score 4  MEWS Score Color Red  Assess: if the MEWS score is Yellow or Red  Were vital signs taken at a resting state? Yes  Focused Assessment Documented focused assessment  Early Detection of Sepsis Score *See Row Information* Low  MEWS guidelines implemented *See Row Information* No, previously red, continue vital signs every 4 hours  Escalate  MEWS: Escalate Red: discuss with charge nurse/RN and provider, consider discussing with RRT  Notify: Charge Nurse/RN  Name of Charge Nurse/RN Notified Clifton Custard  Date Charge Nurse/RN Notified 07/22/19  Time Charge Nurse/RN Notified 0146  Notify: Provider  Provider Name/Title Opyd  Date Provider Notified 07/22/19  Time Provider Notified 0158  Notification Type Page  Notification Reason  (HR sustaining around 135. Red mews)  Response See new orders  Date of Provider Response 07/22/19  Time of Provider Response 0210   Will implement new orders for pt and continue to assess pt.

## 2019-07-22 NOTE — Progress Notes (Signed)
Progress Note  Patient Name: Clarence Andrews. Date of Encounter: 07/22/2019  [facility] Cardiologist: Mertie Moores, MD   Subjective   Feeling OK.  Wants to go home.   Inpatient Medications    Scheduled Meds: . Chlorhexidine Gluconate Cloth  6 each Topical Q0600  . digoxin  0.25 mg Oral Daily  . diltiazem  15 mg Intravenous Once  . diltiazem  30 mg Oral Once  . docusate sodium  200 mg Oral BID  . insulin aspart  0-15 Units Subcutaneous TID WC  . insulin aspart  0-5 Units Subcutaneous QHS  . insulin glargine  10 Units Subcutaneous QHS  . metoprolol tartrate  100 mg Oral BID  . pantoprazole  40 mg Oral QHS  . sodium chloride flush  10-40 mL Intracatheter Q12H   Continuous Infusions: . sodium chloride 10 mL/hr at 07/22/19 0200   PRN Meds: alum & mag hydroxide-simeth, levalbuterol, liver oil-zinc oxide, ondansetron **OR** ondansetron (ZOFRAN) IV, phenol, polyethylene glycol, sodium chloride flush   Vital Signs    Vitals:   07/22/19 0259 07/22/19 0414 07/22/19 0500 07/22/19 0844  BP: 138/75  135/61 (!) 141/70  Pulse: 65  93 89  Resp:    18  Temp:   98.4 F (36.9 C) 98.1 F (36.7 C)  TempSrc:   Oral Oral  SpO2:    100%  Weight:  97.9 kg    Height:        Intake/Output Summary (Last 24 hours) at 07/22/2019 1122 Last data filed at 07/22/2019 0900 Gross per 24 hour  Intake 859.05 ml  Output 2870 ml  Net -2010.95 ml   Last 3 Weights 07/22/2019 07/21/2019 07/19/2019  Weight (lbs) 215 lb 13.3 oz 214 lb 1.1 oz 219 lb 5.7 oz  Weight (kg) 97.9 kg 97.1 kg 99.5 kg      Telemetry    Atrial fibrillation/flutter.  Rates mostly <100.  Occasionally up to 110s. - Personally Reviewed  ECG     Atrial fibrillation.  Rate 103 bpm.  - Personally Reviewed  Physical Exam   VS:  BP (!) 141/70 (BP Location: Left Arm)   Pulse 89   Temp 98.1 F (36.7 C) (Oral)   Resp 18   Ht 6\' 2"  (1.88 m)   Wt 97.9 kg   SpO2 100%   BMI 27.71 kg/m  , BMI Body mass index is 27.71  kg/m. GENERAL:  Chronically ill-appearing HEENT: Pupils equal round and reactive, fundi not visualized, oral mucosa unremarkable NECK:  No jugular venous distention, waveform within normal limits, carotid upstroke brisk and symmetric, no bruits LUNGS:  Clear to auscultation bilaterally HEART: Irregularly irregular.  PMI not displaced or sustained,S1 and S2 within normal limits, no S3, no S4, no clicks, no rubs, no murmurs ABD:  Flat, positive bowel sounds normal in frequency in pitch, no bruits, no rebound, no guarding, no midline pulsatile mass, no hepatomegaly, no splenomegaly EXT:  2 plus pulses throughout, no edema, no cyanosis no clubbing SKIN:  No rashes no nodules NEURO:  Cranial nerves II through XII grossly intact, motor grossly intact throughout PSYCH:  Cognitively intact, oriented to person place and time   Labs    High Sensitivity Troponin:  No results for input(s): TROPONINIHS in the last 720 hours.    Chemistry Recent Labs  Lab 07/19/19 0315 07/20/19 0300 07/21/19 0340  NA 134* 134* 132*  K 3.6 3.4* 3.8  CL 102 101 103  CO2 24 27 25   GLUCOSE 174* 180* 225*  BUN 18 19 19   CREATININE 1.05 1.15 0.99  CALCIUM 7.9* 8.1* 7.9*  PROT 5.7* 6.0* 5.7*  ALBUMIN 2.1* 2.2* 2.1*  AST 43* 30 36  ALT 33 30 32  ALKPHOS 112 126 120  BILITOT 0.6 0.5 0.3  GFRNONAA >60 >60 >60  GFRAA >60 >60 >60  ANIONGAP 8 6 4*     Hematology Recent Labs  Lab 07/19/19 0315 07/20/19 0300 07/21/19 0340  WBC 9.0 8.7 9.3  RBC 2.77* 2.81* 2.78*  HGB 8.0* 8.1* 7.8*  HCT 25.1* 25.9* 25.3*  MCV 90.6 92.2 91.0  MCH 28.9 28.8 28.1  MCHC 31.9 31.3 30.8  RDW 18.0* 18.0* 17.6*  PLT 200 235 252    BNPNo results for input(s): BNP, PROBNP in the last 168 hours.   DDimer No results for input(s): DDIMER in the last 168 hours.   Radiology    No results found.  Cardiac Studies   Echo 6/02/06/19: 1. Left ventricular ejection fraction, by estimation, is 55-60%. The left  ventricle has normal  function. Left ventricular endocardial border not  optimally defined to evaluate regional wall motion even with definity  contrast. There is severe left  ventricular hypertrophy of the basal-septal segment. Left ventricular  diastolic parameters are indeterminate.  2. Right ventricular systolic function was not well visualized. The right  ventricular size is normal. There is normal pulmonary artery systolic  pressure. The estimated right ventricular systolic pressure is 46.5 mmHg.  3. The mitral valve is normal in structure. No evidence of mitral valve  regurgitation. No evidence of mitral stenosis.  4. The aortic valve is tricuspid. Aortic valve regurgitation is trivial.  Mild aortic valve sclerosis is present, with no evidence of aortic valve  stenosis.  5. Aortic dilatation noted. There is mild dilatation at the level of the  sinuses of Valsalva measuring 43 mm.  6. The inferior vena cava is dilated in size with <50% respiratory  variability, suggesting right atrial pressure of 15 mmHg.   Patient Profile     69 y.o. male with CAD s/p CABG in 2017, persistent atrial fibrillation, chronic systolic and diastolic heart failure (LVEF normalized), diabetes, and metastatic colon cancer.  Cardiology consulted for afib with RVR.  Assessment & Plan    # Persistent atrial fib/flutter:  Rates better controlled but still >100 bpm at times.  Rhythm control not an option given metastatic colorectal cancer.  Continue metoprolol and digoxin.  Check digoxin level in AM if he is still here.  If he leaves, will need to check next week.  # CAD s/p CABG:  Not an acitve issue.  S/p remote CABG.  No ASA 2/2 metastatic colon cancer.   # Orthostatic VS:  Patient was orthostatic yesterday.  Improved with IV fluids.  He is adamant about leaving.  Will repeat orthostatic VS.  # Chronic systolic and diastolic heart failure:  VEF imprved from 40-45% to 55-60% this admission.  Hypotensive as above.  No  evidence of volume overload.    For questions or updates, please contact [place] Please consult [email or web address] for contact info under        Signed, Skeet Latch, MD  07/22/2019, 11:22 AM

## 2019-07-22 NOTE — Plan of Care (Signed)
  Problem: Clinical Measurements: Goal: Respiratory complications will improve Outcome: Progressing Goal: Cardiovascular complication will be avoided Outcome: Progressing   Problem: Activity: Goal: Risk for activity intolerance will decrease Outcome: Progressing   Problem: Safety: Goal: Ability to remain free from injury will improve Outcome: Progressing   

## 2019-07-22 NOTE — Care Management Important Message (Signed)
Important Message  Patient Details IM Letter given to Gabriel Earing RN Case Manager to present to the Patient Name: Clarence Andrews. MRN: [id number] Date of Birth: [birth year]   Medicare Important Message Given:  Yes     Kerin Salen 07/22/2019, 10:51 AM

## 2019-07-22 NOTE — Progress Notes (Signed)
Tele called nurse to let her know pt HR was sustaining around 135. Mews turned red and nurse notified CN and provider. Provider put in new order for EKG and for new medication. Upon completing the EKG nurse got two different results. Contacted provider Opyd to make him aware. Provide came to see pt. Told nurse that after accessing pt he wanted Cardizem held since pt HR had began to decrease(between 100-115).   PT has since returned to green mews. Nurse will continue to monitor pt and update provider with any changes.

## 2019-07-23 LAB — C DIFFICILE (CDIFF) QUICK SCRN (NO PCR REFLEX)
C Diff antigen: NEGATIVE
C Diff interpretation: NOT DETECTED
C Diff toxin: NEGATIVE

## 2019-07-23 LAB — CBC WITH DIFFERENTIAL/PLATELET
Abs Immature Granulocytes: 0.04 10*3/uL (ref 0.00–0.07)
Basophils Absolute: 0.1 10*3/uL (ref 0.0–0.1)
Basophils Relative: 1 %
Eosinophils Absolute: 0.1 10*3/uL (ref 0.0–0.5)
Eosinophils Relative: 2 %
HCT: 25.1 % — ABNORMAL LOW (ref 39.0–52.0)
Hemoglobin: 8 g/dL — ABNORMAL LOW (ref 13.0–17.0)
Immature Granulocytes: 1 %
Lymphocytes Relative: 10 %
Lymphs Abs: 0.9 10*3/uL (ref 0.7–4.0)
MCH: 28.9 pg (ref 26.0–34.0)
MCHC: 31.9 g/dL (ref 30.0–36.0)
MCV: 90.6 fL (ref 80.0–100.0)
Monocytes Absolute: 1.1 10*3/uL — ABNORMAL HIGH (ref 0.1–1.0)
Monocytes Relative: 13 %
Neutro Abs: 6.3 10*3/uL (ref 1.7–7.7)
Neutrophils Relative %: 73 %
Platelets: 284 10*3/uL (ref 150–400)
RBC: 2.77 MIL/uL — ABNORMAL LOW (ref 4.22–5.81)
RDW: 17.3 % — ABNORMAL HIGH (ref 11.5–15.5)
WBC: 8.5 10*3/uL (ref 4.0–10.5)
nRBC: 0 % (ref 0.0–0.2)

## 2019-07-23 LAB — MAGNESIUM: Magnesium: 1.8 mg/dL (ref 1.7–2.4)

## 2019-07-23 LAB — GLUCOSE, CAPILLARY
Glucose-Capillary: 100 mg/dL — ABNORMAL HIGH (ref 70–99)
Glucose-Capillary: 155 mg/dL — ABNORMAL HIGH (ref 70–99)
Glucose-Capillary: 158 mg/dL — ABNORMAL HIGH (ref 70–99)
Glucose-Capillary: 145 mg/dL — ABNORMAL HIGH (ref 70–99)

## 2019-07-23 LAB — COMPREHENSIVE METABOLIC PANEL
ALT: 35 U/L (ref 0–44)
AST: 36 U/L (ref 15–41)
Albumin: 2.2 g/dL — ABNORMAL LOW (ref 3.5–5.0)
Alkaline Phosphatase: 143 U/L — ABNORMAL HIGH (ref 38–126)
Anion gap: 10 (ref 5–15)
BUN: 19 mg/dL (ref 8–23)
CO2: 24 mmol/L (ref 22–32)
Calcium: 8.5 mg/dL — ABNORMAL LOW (ref 8.9–10.3)
Chloride: 102 mmol/L (ref 98–111)
Creatinine, Ser: 0.82 mg/dL (ref 0.61–1.24)
GFR calc Af Amer: >60 mL/min (ref >60)
GFR calc non Af Amer: >60 mL/min (ref >60)
Glucose, Bld: 123 mg/dL — ABNORMAL HIGH (ref 70–99)
Potassium: 3.8 mmol/L (ref 3.5–5.1)
Sodium: 136 mmol/L (ref 135–145)
Total Bilirubin: 0.6 mg/dL (ref 0.3–1.2)
Total Protein: 6.1 g/dL — ABNORMAL LOW (ref 6.5–8.1)

## 2019-07-23 LAB — DIGOXIN LEVEL: Digoxin Level: 1 ng/mL (ref 0.8–2.0)

## 2019-07-23 LAB — PHOSPHORUS: Phosphorus: 2.8 mg/dL (ref 2.5–4.6)

## 2019-07-23 MED ORDER — LOPERAMIDE HCL 2 MG PO CAPS
2.0000 mg | ORAL_CAPSULE | ORAL | Status: DC | PRN
  Administered 2019-07-23: 2 mg via ORAL
  Filled 2019-07-23: qty 1

## 2019-07-23 MED ORDER — MIDODRINE HCL 5 MG PO TABS
5.0000 mg | ORAL_TABLET | Freq: Three times a day (TID) | ORAL | Status: DC
  Administered 2019-07-24 (×2): 5 mg via ORAL
  Filled 2019-07-23 (×2): qty 1

## 2019-07-23 MED ORDER — LACTATED RINGERS IV BOLUS
1000.0000 mL | Freq: Once | INTRAVENOUS | Status: AC
  Administered 2019-07-23: 1000 mL via INTRAVENOUS

## 2019-07-23 MED ORDER — LACTATED RINGERS IV BOLUS: 500.0000 mL | Freq: Once | INTRAVENOUS | Status: DC

## 2019-07-23 MED FILL — Dexamethasone Sodium Phosphate Inj 100 MG/10ML: INTRAMUSCULAR | Qty: 1 | Status: AC

## 2019-07-23 NOTE — TOC Progression Note (Signed)
Transition of Care (TOC) - Progression Note    Patient Details  Name: Clarence Andrews. MRN: [id number] Date of Birth: 05/22/1950  Transition of Care [facility]) CM/SW Contact  Purcell Mouton, RN Phone Number: 07/23/2019, 1:56 PM  Clinical Narrative:    Spoke with pt today in length concerning discharge plan and SNF. Pt continues to decline SNF. Pt states," My mother nearly died twice In a facility. Those places will take your Medicare check. I have friends that will help me out. I am going home. I a not going I can take care of myself with the help of my friends."   Expected Discharge Plan: [place] Barriers to Discharge: Continued Medical Work up  Expected Discharge Plan and Services Expected Discharge Plan: [place] Choice: [place] arrangements for the past 2 months: Single Family Home                                       Social Determinants of Health (SDOH) Interventions    Readmission Risk Interventions No flowsheet data found.

## 2019-07-23 NOTE — Progress Notes (Addendum)
PROGRESS NOTE    [name]  OIZ:[id number] DOB: 1950/07/18 DOA: 07/09/2019 PCP: Patient, No Pcp Per   Chief Complaint  Patient presents with  . Weakness   Brief Narrative: Clarence Andrews. is Clarence Andrews 69 y.o. male with Mysty Kielty history of CAD s/p CABG, persistent atrial fibrillation/flutter not on anticoagulation secondary to rectal bleeding, chronic systolic heart failure, previous COVID-19 infection, metastatic colon cancer. Patient presented from his oncology office secondary to atrial fibrillation with RVR. While admitted he developed significant nausea and vomiting with CT evidence of obstruction of his sigmoid from is colonic mass.  He's now s/p stenting by GI.  Discharge planning c/b persistent difficult to control RVR, orthostatic hypotension, and unsafe d/c plan.  Pt lives alone, wants to drive car home, therapy currently recommending SNF.  He notes friends are coming to pick him up tomorrow to go home (I do not know if he'll be medically ready at that time).  Assessment & Plan:   Principal Problem:   Atrial fibrillation with RVR ([place]) Active Problems:   Cancer of left colon (HCC)   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HCC)   Type 2 diabetes mellitus without complication ([place])   CAD (coronary artery disease)   Chronic systolic heart failure (HCC)   Colonic obstruction (HCC)  Presyncope  Orthostatic Hypotension:  2/2 hypovolemia with frequent stools or vagal event while having BM on 6/21.  Continues to have some soft BP's today in the 33'Delsa Walder systolic.   Follow orthostatics Midodrine added by cardiology Bolus again today W/u stools as noted below  Persistent atrial flutter/fibrillation with RVR Patient with Clarence Andrews history of atrial fibrillation as an outpatient on metoprolol 100 mg BID and digoxin 0.125 mg daily.  Persistent RVR Metoprolol increased to 100 mg BID Digoxin increased to 0.25 -> therapeutic dig level today Repeat echo with EF 55-60%, severe LVH of basal septal segment  (see report) Cardiology c/s, appreciate recs Not on anticoagulation due to risk of rectal bleeding   Frequent Stools: continue to monitor.  No abdominal pain.  No fevers.  Hx c diff, with freq stools will send c diff -> negative. imodium  Hypokalemia  Hypomagnesemia: replace and follow.  Goal K>4, Mag>2.  Large bowel obstruction Patient with no bowel movement in 5 days prior to admission. No prior history of obstruction but has had abdominal surgery in the past. History of adenocarcinoma of the colon as mentioned below. Abdominal x-ray (6/8) significant for possible ileus. CT abdomen/pelvis (6/9) obtained and significant for severe/obstructing malignant stricture at sigmoid colon. General surgery and GI consulted on 6/9. Patient felt better after NG tube placement. Having bowel movements after colonic stent placed which was significant for completely obstructing colonic mass. -GI recommendations: advance diet as able; stent placed 6/11 -General surgery recommendations pending. Signed off, outpatient follow-up  Leukocytosis Improved, continue to monitor  Adenocarcinoma of the colon Metastatic adenocarcinoma to liver Recently diagnosed. Currently follows with medical oncology, Dr. [name] and is on active chemotherapy treatment.  Chronic systolic heart failure EF of 40-45%. Patient is euvolemic on admission. On metoprolol (Lopressor) as an outpatient. Patient is not on diuretic therapy. Rales noted on exam but chest x-ray without evidence of effusion or edema.  - 6/16 EF 55-60%, severe LVH (see report) -Watch fluid status; daily weights and strict in/out  History of CABG CAD Patient was previously on aspirin which was discontinued secondary to rectal bleeding.  Diabetes mellitus, type 2 Patient is on glipizide as an outpatient. Last hemoglobin A1C of 7.8%.  -  Continue SSI  AKI on  CKD stage II Baseline creatinine of 1.2. Peak creatinine of 1.5 which is now back to baseline  with IV fluids. Resolved.  Tick Bite: tick found on L chest 6/16, removed with tweezer.  Follow for rash.  Given 200 mg doxycycline x1 for ppx.  Therapy recommending SNF.  Pt resistant to this idea, but I think it's unsafe for discharge home as he has no support at home.  I don't think he's strong enough to go home independently and his HR continues to be uncontrolled.  He has continued hypovolemia which make it unsafe I believe.  He wanted to leave AMA earlier in the hospitalization, but wanted to drive home, which would have been dangerous in the setting of his hypotension and debility.  He apparently has friends coming to pick him up tomorrow - discussed I don't know if he'll be medically ready then with his RVR and hypotension that he's had today.    DVT prophylaxis: SCD Code Status: full  Family Communication: none at bedside Disposition:   Status is: Inpatient  Remains inpatient appropriate because:Inpatient level of care appropriate due to severity of illness   Dispo: The patient is from: Home              Anticipated d/c is to: pending              Anticipated d/c date is: 3 days              Patient currently is not medically stable to d/c.   Consultants:   Cardiology  Procedures:  Echo IMPRESSIONS    1. Left ventricular ejection fraction, by estimation, is 55-60%. The left  ventricle has normal function. Left ventricular endocardial border not  optimally defined to evaluate regional wall motion even with definity  contrast. There is severe left  ventricular hypertrophy of the basal-septal segment. Left ventricular  diastolic parameters are indeterminate.  2. Right ventricular systolic function was not well visualized. The right  ventricular size is normal. There is normal pulmonary artery systolic  pressure. The estimated right ventricular systolic pressure is 38.7 mmHg.  3. The mitral valve is normal in structure. No evidence of mitral valve  regurgitation. No  evidence of mitral stenosis.  4. The aortic valve is tricuspid. Aortic valve regurgitation is trivial.  Mild aortic valve sclerosis is present, with no evidence of aortic valve  stenosis.  5. Aortic dilatation noted. There is mild dilatation at the level of the  sinuses of Valsalva measuring 43 mm.  6. The inferior vena cava is dilated in size with <50% respiratory  variability, suggesting right atrial pressure of 15 mmHg.   Impression - Preparation of the colon was fair but inadequate for appropriate screening purposes but not for diagnostic/therapeutic intention.. - Hemorrhoids found on digital rectal exam. - Malignant completely obstructing tumor from 17 to 20 cm proximal to the anus. - Dilated in the sigmoid colon proximal to the obstructing colonic mass. - Wire placed beyond the stenosis, area injected with radiographic contrast to delineate stenosis further. Prosthesis placed. Recommendations - The patient will be observed post-procedure, until all discharge criteria are met. - Patient to the PACU for recovery. - Return patient to hospital ward for ongoing care thereafter. - Observe clinical status. - May trial sips of clear liquids the rest of today but maintain NGT in place but can try to cap for now. - If patient tolerates the sips and KUB tomorrow (already ordered) looks to **Note De-Identified vi Obfusction** hve enterl stent still in good position, then would llow cler liquids to dvnce to full liquids throughout Sturdy. On Sundy, if ptient is doing well, then my trnsition to soft/pureed foods for next 48 hours. Then my trnsition to  low-residue diet moving forwrd. Stop ll fiber supplementtion. - Recommend tht ptient be initited on Colce 200 mg 1-2 times dily on Sturdy. Would lso recommend Mirlx 1-2 cpfuls dily in effort of trying to optimize stool consistency nd not llow stent to become clogged in the coming dys. - Stent should fully expnd over the next 2-weeks. - The  findings nd recommendtions were discussed with the ptient. - The findings nd recommendtions were discussed with the referring physicin. - The findings nd recommendtions were discussed with the Hospitl Tem.  Antimicrobils:  Anti-infectives (From dmission, onwrd)   Strt     Dose/Rte Route Frequency Ordered Stop   07/17/19 1430  doxycycline (VIBRA-TABS) tblet 200 mg        20 0 mg Orl  Once 07/17/19 1419 07/17/19 1637     Subjective: Frustrted, hs friends coming tomorrow  Objective: Vitls:   07/23/19 1753 07/23/19 1756 07/23/19 1758 07/23/19 1800  BP: (!) 89/61 96/70 (!) 146/97 109/65  Pulse: (!) 136 (!) 109    Resp: 14     Temp: 98.8 F (37.1 C)     TempSrc: Orl     SpO2: 100%     Weight:      Height:        Intke/Output Summry (Lst 24 hours) t 07/23/2019 1949 Lst dt filed t 07/23/2019 1404 Gross per 24 hour  Intke 444.39 ml  Output 200 ml  Net 244.39 ml   Filed Weights   07/19/19 0327 07/21/19 0354 07/22/19 0414  Weight: 99.5 kg 97.1 kg 97.9 kg    Exmintion:  Generl: No cute distress. Crdiovsculr: irregu, irreg, tchy Lungs: Cler to usculttion bilterlly Abdomen: Soft, nontender, nondistended  Neurologicl: Alert nd oriented 3. Moves ll extremities 4 . Crnil nerves II through XII grossly intct. Skin: Wrm nd dry. No rshes or lesions. Extremities: No clubbing or cynosis. No edem.      Dt Reviewed: I hve personlly reviewed following lbs nd imging studies  CBC: Recent Lbs  Lb 07/19/19 0315 07/20/19 0300 07/21/19 0340 07/22/19 1316 07/23/19 0342  WBC 9.0 8.7 9.3 8.6 8.5  NEUTROABS 6.6 6.3 7.0 6.6 6.3  HGB 8.0* 8.1* 7.8* 8.8* 8.0*  HCT 25.1* 25.9* 25.3* 28.0* 25.1*  MCV 90.6 92.2 91.0 90.9 90.6  PLT 200 235 252 303 540    Bsic Metbolic Pnel: Recent Lbs  Lb 07/19/19 0315 07/20/19 0300 07/21/19 0340 07/22/19 1316 07/23/19 0342  NA 134* 134* 132* 134* 136  K 3.6 3.4* 3.8 4.2 3.8  CL  102 101 103 100 102  CO2 24 27 25 25 24   GLUCOSE 174* 180* 225* 264* 123*  BUN 18 19 19 17 19   CREATININE 1.05 1.15 0.99 0.90 0.82  CALCIUM 7.9* 8.1* 7.9* 8.2* 8.5*  MG 1.7 1.4* 1.7 1.6* 1.8  PHOS 2.3* 2.4* 2.1* 2.3* 2.8    GFR: Estimted Cretinine Clernce: 96.1 mL/min (by [employer] formul bsed on SCr of 0.82 mg/dL).  Liver Function Tests: Recent Lbs  Lb 07/19/19 0315 07/20/19 0300 07/21/19 0340 07/22/19 1316 07/23/19 0342  AST 43* 30 36 30 36  ALT 33 30 32 34 35  ALKPHOS 112 126 120 133* 143*  BILITOT 0.6 0.5 0.3 0.7 0.6  PROT 5.7* 6.0* 5.7* 6.1* 6.1*  ALBUMIN 2.1* 2.2* 2.1* 2.2* 2.2*    CBG: Recent Labs  Lab 07/22/19 1645 07/22/19 2134 07/23/19 0722 07/23/19 1200 07/23/19 1659  GLUCAP 218* 206* 100* 155* 158*     Recent Results (from the past 240 hour(s))  C Difficile Quick Screen (NO PCR Reflex)     Status: None   Collection Time: 07/23/19  4:06 PM   Specimen: STOOL  Result Value Ref Range Status   C Diff antigen NEGATIVE NEGATIVE Final   C Diff toxin NEGATIVE NEGATIVE Final   C Diff interpretation No C. difficile detected.  Final    Comment: Performed at [facility], [place] [address], [place], [place] [address]         Radiology Studies: No results found.      Scheduled Meds: . Chlorhexidine Gluconate Cloth  6 each Topical Q0600  . digoxin  0.25 mg Oral Daily  . diltiazem  15 mg Intravenous Once  . diltiazem  30 mg Oral Once  . docusate sodium  200 mg Oral BID  . insulin aspart  0-15 Units Subcutaneous TID WC  . insulin aspart  0-5 Units Subcutaneous QHS  . insulin glargine  10 Units Subcutaneous QHS  . metoprolol tartrate  100 mg Oral BID  . [START ON 07/24/2019] midodrine  5 mg Oral TID WC  . pantoprazole  40 mg Oral QHS  . sodium chloride flush  10-40 mL Intracatheter Q12H   Continuous Infusions: . sodium chloride 10 mL/hr at 07/23/19 1550     LOS: 14 days    Time spent: over 30 min    Fayrene Helper,  MD [facility]   To contact the attending provider between 7A-7P or the covering provider during after hours 7P-7A, please log into the web site [email or web address] and access using universal [place] password for that web site. If you do not have the password, please call the hospital operator.  07/23/2019, 7:49 PM

## 2019-07-23 NOTE — Progress Notes (Signed)
Tried multiple attempts to weigh patient in the ICU bed. Another RN attempted several times as well.  Unable to obtain daily weight at this time.

## 2019-07-23 NOTE — Progress Notes (Addendum)
HEMATOLOGY-ONCOLOGY PROGRESS NOTE  SUBJECTIVE: Reports that he feels better.  Wants to go home.  PT recommending SNF.  Had a presyncopal episode yesterday.  The patient attributes this to a low magnesium level.  Magnesium was slightly low yesterday at 1.6.  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[place])   04/11/2019 -  Chemotherapy   The patient had palonosetron (ALOXI) injection 0.25 mg, 0.25 mg, Intravenous,  Once, 5 of 6 cycles Administration: 0.25 mg (04/11/2019), 0.25 mg (05/28/2019), 0.25 mg (06/11/2019), 0.25 mg (06/25/2019) pegfilgrastim-cbqv (UDENYCA) injection 6 mg, 6 mg, Subcutaneous, Once, 4 of 5 cycles Administration: 6 mg (05/30/2019), 6 mg (06/13/2019), 6 mg (06/27/2019) leucovorin 972 mg in dextrose 5 % 250 mL infusion, 400 mg/m2 = 972 mg, Intravenous,  Once, 5 of 6 cycles Administration: 972 mg (04/11/2019), 972 mg (05/28/2019), 972 mg (06/11/2019), 972 mg (06/25/2019) oxaliplatin (ELOXATIN) 200 mg in dextrose 5 % 500 mL chemo infusion, 83 mg/m2 = 205 mg, Intravenous,  Once, 5 of 6 cycles Administration: 200 mg (04/11/2019), 200 mg (05/28/2019), 200 mg (06/11/2019), 200 mg (06/25/2019) fluorouracil (ADRUCIL) chemo injection 950 mg, 400 mg/m2 = 950 mg, Intravenous,  Once, 1 of 1 cycle Administration: 950 mg (04/11/2019) fluorouracil (ADRUCIL) 5,850 mg in sodium chloride 0.9 % 133 mL chemo infusion, 2,400 mg/m2 = 5,850 mg, Intravenous, 1 Day/Dose, 5 of 6 cycles Dose modification: 2,000 mg/m2 (original dose 2,400 mg/m2, Cycle 2, Reason: Provider Judgment) Administration: 5,850 mg (04/11/2019), 4,850 mg (05/28/2019), 4,850 mg (06/11/2019), 4,850 mg (06/25/2019)  for chemotherapy treatment.    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[place])   04/11/2019 -  Chemotherapy   The patient had palonosetron (ALOXI) injection 0.25 mg, 0.25 mg, Intravenous,  Once, 5 of 6  cycles Administration: 0.25 mg (04/11/2019), 0.25 mg (05/28/2019), 0.25 mg (06/11/2019), 0.25 mg (06/25/2019) pegfilgrastim-cbqv (UDENYCA) injection 6 mg, 6 mg, Subcutaneous, Once, 4 of 5 cycles Administration: 6 mg (05/30/2019), 6 mg (06/13/2019), 6 mg (06/27/2019) leucovorin 972 mg in dextrose 5 % 250 mL infusion, 400 mg/m2 = 972 mg, Intravenous,  Once, 5 of 6 cycles Administration: 972 mg (04/11/2019), 972 mg (05/28/2019), 972 mg (06/11/2019), 972 mg (06/25/2019) oxaliplatin (ELOXATIN) 200 mg in dextrose 5 % 500 mL chemo infusion, 83 mg/m2 = 205 mg, Intravenous,  Once, 5 of 6 cycles Administration: 200 mg (04/11/2019), 200 mg (05/28/2019), 200 mg (06/11/2019), 200 mg (06/25/2019) fluorouracil (ADRUCIL) chemo injection 950 mg, 400 mg/m2 = 950 mg, Intravenous,  Once, 1 of 1 cycle Administration: 950 mg (04/11/2019) fluorouracil (ADRUCIL) 5,850 mg in sodium chloride 0.9 % 133 mL chemo infusion, 2,400 mg/m2 = 5,850 mg, Intravenous, 1 Day/Dose, 5 of 6 cycles Dose modification: 2,000 mg/m2 (original dose 2,400 mg/m2, Cycle 2, Reason: Provider Judgment) Administration: 5,850 mg (04/11/2019), 4,850 mg (05/28/2019), 4,850 mg (06/11/2019), 4,850 mg (06/25/2019)  for chemotherapy treatment.      PHYSICAL EXAMINATION:  Vitals:   07/23/19 0112 07/23/19 0623  BP: 108/62 113/69  Pulse: 66 67  Resp: 18 20  Temp: 97.9 F (36.6 C) 98 F (36.7 C)  SpO2: 100% 98%   Filed Weights   07/19/19 0327 07/21/19 0354 07/22/19 0414  Weight: 99.5 kg 97.1 kg 97.9 kg    Intake/Output from previous day: 06/21 0701 - 06/22 0700 In: 684.4 [P.O.:480; I.V.:204.4] Out: 1600 [Urine:1600]  GENERAL:alert, no distress and comfortable HEENT: No thrush or ulcers LUNGS: Clear anteriorly HEART:  Regular rhythm ABDOMEN:abdomen soft, non-tender  NEURO: alert & oriented x 3 with fluent speech, no focal motor/sensory deficits Vascular: No leg edema  LABORATORY DATA:  I have reviewed the data as listed CMP Latest Ref Rng & Units  07/23/2019 07/22/2019 07/21/2019  Glucose 70 - 99 mg/dL 123(H) 264(H) 225(H)  BUN 8 - 23 mg/dL 19 17 19   Creatinine 0.61 - 1.24 mg/dL 0.82 0.90 0.99  Sodium 135 - 145 mmol/L 136 134(L) 132(L)  Potassium 3.5 - 5.1 mmol/L 3.8 4.2 3.8  Chloride 98 - 111 mmol/L 102 100 103  CO2 22 - 32 mmol/L 24 25 25   Calcium 8.9 - 10.3 mg/dL 8.5(L) 8.2(L) 7.9(L)  Total Protein 6.5 - 8.1 g/dL 6.1(L) 6.1(L) 5.7(L)  Total Bilirubin 0.3 - 1.2 mg/dL 0.6 0.7 0.3  Alkaline Phos 38 - 126 U/L 143(H) 133(H) 120  AST 15 - 41 U/L 36 30 36  ALT 0 - 44 U/L 35 34 32    Lab Results  Component Value Date   WBC 8.5 07/23/2019   HGB 8.0 (L) 07/23/2019   HCT 25.1 (L) 07/23/2019   MCV 90.6 07/23/2019   PLT 284 07/23/2019   NEUTROABS 6.3 07/23/2019    DG Chest 1 View  Result Date: 07/09/2019 CLINICAL DATA:  Tachycardia and diffuse abdominal pain. EXAM: CHEST  1 VIEW COMPARISON:  04/28/2019 FINDINGS: Previous median sternotomy. Power port inserted from a right jugular approach with tip at the SVC RA junction. Heart size is upper limits of normal with some left ventricular prominence. No acute mediastinal finding. The pulmonary vascularity is normal. The lungs are clear. No infiltrate, collapse or effusion. IMPRESSION: No active disease. Electronically Signed   By: Nelson Chimes M.D.   On: 07/09/2019 14:21   DG Abd 1 View  Result Date: 07/10/2019 CLINICAL DATA:  Nasogastric tube placement. EXAM: ABDOMEN - 1 VIEW COMPARISON:  July 09, 2019 (1:53 p.m.) FINDINGS: Multiple sternal wires and sternal fixation plates and screws are seen. A nasogastric tube is seen with its distal tip overlying the body of the stomach. Numerous dilated small bowel loops are again seen throughout the abdomen and pelvis. No radio-opaque calculi or other significant radiographic abnormality are seen. IMPRESSION: Nasogastric tube positioning, as described above. Electronically Signed   By: Virgina Norfolk M.D.   On: 07/10/2019 17:56   DG Abdomen 1  View  Result Date: 07/09/2019 CLINICAL DATA:  Tachycardia.  Diffuse abdominal pain. EXAM: ABDOMEN - 1 VIEW COMPARISON:  None. FINDINGS: There is gas throughout small and large bowel most consistent with ileus. No focal dilatation to suggest obstruction. No free air. No abnormal calcifications. Ordinary degenerative changes affect the spine. IMPRESSION: Possible ileus pattern. No evidence of frank obstruction or focal lesion. Electronically Signed   By: Nelson Chimes M.D.   On: 07/09/2019 14:22   CT ABDOMEN PELVIS W CONTRAST  Result Date: 07/10/2019 CLINICAL DATA:  Rectal tumor. Abdominal distention. No bowel movement for days. EXAM: CT ABDOMEN AND PELVIS WITH CONTRAST TECHNIQUE: Multidetector CT imaging of the abdomen and pelvis was performed using the standard protocol following bolus administration of intravenous contrast. CONTRAST:  123m OMNIPAQUE IOHEXOL 300 MG/ML  SOLN COMPARISON:  03/15/2019 FINDINGS: Lower chest: Atelectasis and patchy airspace opacity in the lower lungs. Atherosclerosis and postoperative heart. Hepatobiliary: Multifocal hepatic metastatic disease with ill-defined masses measuring up to 4 cm in the posterior right liver, roughly 5 mm smaller than the before. Evidence of biliary obstruction or stone. Pancreas: Diffuse fatty atrophy with some preservation of  density at the tail. There could be a cyst at the uncinate process, incidental in this setting. Spleen: Unremarkable. Adrenals/Urinary Tract: Negative adrenals. No hydronephrosis or stone. Simple bilateral renal cysts. Unremarkable bladder. Stomach/Bowel: Known sigmoid mass with severe luminal stenosis. The adjacent wall thickening is less thickened, but there is still spiculated density extending through the serosa into the mesentery. Above this narrowing there is generalized gas and fluid dilated bowel. Vascular/Lymphatic: No acute vascular finding. Decrease in size of IMA lymph nodes. Reproductive:No pathologic findings. Other: No  ascites or pneumoperitoneum. Musculoskeletal: No acute abnormalities. IMPRESSION: 1. 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 2. Multifocal hepatic metastatic disease which is stable or slightly improved from February 2021. Electronically Signed   By: Monte Fantasia M.D.   On: 07/10/2019 11:32   DG Abd 2 Views  Result Date: 07/13/2019 CLINICAL DATA:  Status post colonic stent placement EXAM: ABDOMEN - 2 VIEW COMPARISON:  July 10, 2019 FINDINGS: There is an apparent stent in the sigmoid colon region overlying the sacrum. There is no appreciable bowel dilatation or air-fluid level to suggest bowel obstruction. No free air. Nasogastric tube tip and side port in stomach. There are surgical clips in the upper abdomen. Lung bases are clear. IMPRESSION: Apparent stent in the sigmoid colon region overlying the sacrum. Nasogastric tube tip and side port in stomach. No bowel obstruction or free air. Lung bases clear. Electronically Signed   By: Lowella Grip III M.D.   On: 07/13/2019 11:44   DG C-Arm 1-60 Min-No Report  Result Date: 07/12/2019 Fluoroscopy was utilized by the requesting physician.  No radiographic interpretation.   ECHOCARDIOGRAM LIMITED  Result Date: 07/17/2019    ECHOCARDIOGRAM LIMITED REPORT   Patient Name:   Clarence Andrews. Date of Exam: 07/17/2019 Medical Rec #:  [id number]          Height:       74.0 in Accession #:    [id number]         Weight:       218.9 lb Date of Birth:  1950-07-22           BSA:          2.259 m Patient Age:    69 years           BP:           117/54 mmHg Patient Gender: M                  HR:           98 bpm. Exam Location:  Inpatient Procedure: Limited Echo, Intracardiac Opacification Agent, Color Doppler and            Cardiac Doppler Indications:    Congestive Heart Failure 428.0 / I50.9  History:        Patient has prior history of Echocardiogram  examinations, most                 recent 04/28/2019. CAD, Prior CABG; Risk Factors:Diabetes.                 Malignant neoplasm of sigmoid colon, history of throat cancer.  Sonographer:    Darlina Sicilian RDCS Referring Phys: [id number] Nadean Corwin A [place]  1. Left ventricular ejection fraction, by estimation, is 55-60%. The left ventricle has normal function. Left ventricular endocardial border not optimally defined to evaluate regional wall motion even  with definity contrast. There is severe left ventricular hypertrophy of the basal-septal segment. Left ventricular diastolic parameters are indeterminate.  2. Right ventricular systolic function was not well visualized. The right ventricular size is normal. There is normal pulmonary artery systolic pressure. The estimated right ventricular systolic pressure is 48.1 mmHg.  3. The mitral valve is normal in structure. No evidence of mitral valve regurgitation. No evidence of mitral stenosis.  4. The aortic valve is tricuspid. Aortic valve regurgitation is trivial. Mild aortic valve sclerosis is present, with no evidence of aortic valve stenosis.  5. Aortic dilatation noted. There is mild dilatation at the level of the sinuses of Valsalva measuring 43 mm.  6. The inferior vena cava is dilated in size with <50% respiratory variability, suggesting right atrial pressure of 15 mmHg. FINDINGS  Left Ventricle: Left ventricular ejection fraction, by estimation, is 55 to 60%. The left ventricle has normal function. Left ventricular endocardial border not optimally defined to evaluate regional wall motion. The left ventricular internal cavity size was normal in size. There is severe left ventricular hypertrophy of the basal-septal segment. Right Ventricle: The right ventricular size is normal. No increase in right ventricular wall thickness. Right ventricular systolic function was not well visualized. There is normal pulmonary artery systolic pressure. The tricuspid  regurgitant velocity is  1.36 m/s, and with an assumed right atrial pressure of 15 mmHg, the estimated right ventricular systolic pressure is 85.6 mmHg. Left Atrium: Left atrial size was normal in size. Right Atrium: Right atrial size was normal in size. Pericardium: There is no evidence of pericardial effusion. Mitral Valve: The mitral valve is normal in structure. Normal mobility of the mitral valve leaflets. No evidence of mitral valve stenosis. Tricuspid Valve: The tricuspid valve is normal in structure. Tricuspid valve regurgitation is trivial. No evidence of tricuspid stenosis. Aortic Valve: The aortic valve is tricuspid. Aortic valve regurgitation is trivial. Mild aortic valve sclerosis is present, with no evidence of aortic valve stenosis. Pulmonic Valve: The pulmonic valve was normal in structure. Pulmonic valve regurgitation is not visualized. No evidence of pulmonic stenosis. Aorta: Aortic dilatation noted. There is mild dilatation at the level of the sinuses of Valsalva measuring 43 mm. Venous: The inferior vena cava is dilated in size with less than 50% respiratory variability, suggesting right atrial pressure of 15 mmHg. IAS/Shunts: No atrial level shunt detected by color flow Doppler.  LEFT VENTRICLE PLAX 2D LVIDd:         4.30 cm LVIDs:         3.55 cm LV PW:         1.05 cm LV IVS:        1.80 cm LVOT diam:     2.20 cm LV SV:         48 LV SV Index:   21 LVOT Area:     3.80 cm  LV Volumes (MOD) LV vol d, MOD A2C: 82.9 ml LV vol d, MOD A4C: 173.0 ml LV vol s, MOD A2C: 56.7 ml LV vol s, MOD A4C: 88.6 ml LV SV MOD A2C:     26.2 ml LV SV MOD A4C:     173.0 ml LV SV MOD BP:      51.9 ml LEFT ATRIUM         Index LA diam:    2.40 cm 1.06 cm/m  AORTIC VALVE LVOT Vmax:   75.00 cm/s LVOT Vmean:  57.000 cm/s LVOT VTI:    0.126 m  AORTA Ao  Root diam: 4.30 cm MITRAL VALVE               TRICUSPID VALVE MV Area (PHT): 4.96 cm    TR Peak grad:   7.4 mmHg MV Decel Time: 153 msec    TR Vmax:        136.00 cm/s MV  E velocity: 95.20 cm/s                            SHUNTS                            Systemic VTI:  0.13 m                            Systemic Diam: 2.20 cm Fransico Him MD Electronically signed by Fransico Him MD Signature Date/Time: 07/17/2019/10:43:14 AM    Final     ASSESSMENT AND PLAN: 1. 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 Colonoscopy 03/07/2019-nearly completely obstructing mass beginning at 16 cm from the anal verge, sigmoid colon polyp ? CTs 03/15/2019-wall thickening of the lower sigmoid colon, low rectal mass with significant luminal narrowing, diffuse liver metastases, borderline enlarged sigmoid mesocolon, iliac, and upper abdominal lymph nodes ? Elevated CEA ? 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 Cycle 1 FOLFOX 04/11/2019 ? Cycle 2 FOLFOX 05/28/2019 ? Cycle 3 FOLFOX 06/11/2019 ? Cycle 4 FOLFOX 06/25/2019 ? CT abdomen/pelvis 07/10/2027-severe obstructing stricture at the sigmoid colon, stable to slight improvement in hepatic metastatic disease compared to February 2021 2. 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 Diabetes 4. Coronary artery disease, status post coronary artery bypass surgery in 2017 5. Neutropenia following cycle 1 FOLFOX 6. Admission 04/28/2019 with COVID-19 infection 7. C. difficile colitis 04/28/2019 8. Atrial flutter during hospital admission March/April 2021-treated with a Cardizem drip, digoxin, and Eliquis anticoagulation. Converted to metoprolol at discharge.  Admission with recurrent rapid atrial fibrillation/flutter 07/09/2019 9. 07/09/2019-hospital admission for bowel obstruction  Sigmoidoscopy 07/12/2019-completely obstructing mass at 17-20 cm proximal to the anus-oozing  present, stent placed  Clarence Andrews appearsstable.  He is stable for discharge from an oncology standpoint.  He has anemia secondary to chronic disease, phlebotomy, chemotherapy, and subclinical GI bleeding.  Hemoglobin overall stable.  The patient has limited ability to ambulate and lives alone.  Agree with SNF placement, but patient is declining this.  1.  If patient able to ambulate, may be discharged home.  He states that he has a neighbor that can help drive him and take care of him.  The neighbor also can help bring him to visits at the cancer center. 2.  We will arrange for outpatient follow-up for his next chemo once disposition plan is determined.    LOS: 14 days   Clarence Andrews 07/23/19 Clarence Andrews was interviewed and examined.  He appears stable, but remains limited in his ability to ambulate and perform self-care.  He says that he wants to go home and reports a friend plans to help with his care.  I encouraged him to consider skilled nursing facility placement.  The plan is to resume FOLFOX chemotherapy as an outpatient.

## 2019-07-23 NOTE — Progress Notes (Addendum)
Patient refused his evening dose of colace due to having multiple loose bowel movements. Patient had a large loose brown BM at the beginning of the evening shift. Will continue to monitor.

## 2019-07-23 NOTE — Progress Notes (Signed)
Occupational Therapy Treatment Patient Details Name: Clarence Andrews. MRN: [id number] DOB: 07/14/1950 Today's Date: 07/23/2019    History of present illness 69 y.o. male with a history of CAD s/p CABG, persistent atrial fibrillation/flutter not on anticoagulation secondary to rectal bleeding, chronic systolic heart failure, previous COVID-19 infection, metastatic colon cancer. Patient presented from his oncology office secondary to atrial fibrillation with RVR. While admitted he developed significant nausea and vomiting with CT evidence of obstruction of his sigmoid from is colonic mass   OT comments  Pt now willing to go home with a friend and recognizes that he needs A- but is still refusing SNF  Follow Up Recommendations  SNF;Other (comment);Home health OT (if patient declines rec [facility])    Equipment Recommendations  3 in 1 bedside commode    Recommendations for Other Services      Precautions / Restrictions Precautions Precautions: Fall Precaution Comments: monitor HR and BP       Mobility Bed Mobility Overal bed mobility: Needs Assistance Bed Mobility: Supine to Sit Rolling: Min assist            Transfers Overall transfer level: Needs assistance Equipment used: Rolling walker (2 wheeled) Transfers: Sit to/from [facility] Sit to Stand: Min assist Stand pivot transfers: Min assist                ADL either performed or assessed with clinical judgement   ADL Overall ADL's : Needs assistance/impaired     Grooming: Wash/dry hands;Wash/dry face;Standing;Minimal assistance                   Toilet Transfer: Minimal assistance;Ambulation;Cueing for safety;BSC;RW   Toileting- Clothing Manipulation and Hygiene: Minimal assistance;Sit to/from stand;Cueing for safety;Cueing for compensatory techniques       Functional mobility during ADLs: Minimal assistance;Rolling walker;Cueing for safety General ADL Comments: pt will need 24/7 A or  SNF               Cognition Arousal/Alertness: Awake/alert Behavior During Therapy: WFL for tasks assessed/performed Overall Cognitive Status: Within Functional Limits for tasks assessed                                                     Pertinent Vitals/ Pain       Pain Assessment: No/denies pain         Frequency  Min 2X/week        Progress Toward Goals  OT Goals(current goals can now be found in the care plan section)  Progress towards OT goals: Progressing toward goals  Acute Rehab OT Goals Patient Stated Goal: To go home OT Goal Formulation: With patient Time For Goal Achievement: 07/31/19 Potential to Achieve Goals: Good  Plan Discharge plan remains appropriate       AM-PAC OT "6 Clicks" Daily Activity     Outcome Measure   Help from another person eating meals?: None Help from another person taking care of personal grooming?: A Little Help from another person toileting, which includes using toliet, bedpan, or urinal?: A Lot Help from another person bathing (including washing, rinsing, drying)?: A Lot Help from another person to put on and taking off regular upper body clothing?: A Little Help from another person to put on and taking off regular lower body clothing?: A Lot 6 Click Score: 16  End of Session Equipment Utilized During Treatment: Gait belt;Rolling walker  OT Visit Diagnosis: Unsteadiness on feet (R26.81);Muscle weakness (generalized) (M62.81)   Activity Tolerance Patient tolerated treatment well   Patient Left in chair;with call bell/phone within reach;with nursing/sitter in room   Nurse Communication Mobility status        Time: 1455-1520 OT Time Calculation (min): 25 min  Charges: OT General Charges $OT Visit: 1 Visit OT Treatments $Self Care/Home Management : 23-37 mins  Kari Baars, [place] [phone] Office- [phone]      Pedro [place], Edwena Felty  D 07/23/2019, 5:04 PM

## 2019-07-23 NOTE — Progress Notes (Signed)
Progress Note  Patient Name: Clarence Andrews. Date of Encounter: 07/23/2019  [facility] Cardiologist: Mertie Moores, MD   Subjective   No chest pain and no SOB  Inpatient Medications    Scheduled Meds: . Chlorhexidine Gluconate Cloth  6 each Topical Q0600  . digoxin  0.25 mg Oral Daily  . diltiazem  15 mg Intravenous Once  . diltiazem  30 mg Oral Once  . docusate sodium  200 mg Oral BID  . insulin aspart  0-15 Units Subcutaneous TID WC  . insulin aspart  0-5 Units Subcutaneous QHS  . insulin glargine  10 Units Subcutaneous QHS  . metoprolol tartrate  100 mg Oral BID  . pantoprazole  40 mg Oral QHS  . sodium chloride flush  10-40 mL Intracatheter Q12H   Continuous Infusions: . sodium chloride 10 mL/hr at 07/23/19 0324   PRN Meds: alum & mag hydroxide-simeth, levalbuterol, liver oil-zinc oxide, ondansetron **OR** ondansetron (ZOFRAN) IV, phenol, polyethylene glycol, sodium chloride flush   Vital Signs    Vitals:   07/23/19 0623 07/23/19 1059 07/23/19 1129 07/23/19 1356  BP: 113/69 (!) 86/65 107/84 133/85  Pulse: 67 (!) 138 (!) 137 (!) 134  Resp: 20  20 14   Temp: 98 F (36.7 C)  97.6 F (36.4 C) (!) 97.5 F (36.4 C)  TempSrc: Oral  Oral Oral  SpO2: 98%  100% 99%  Weight:      Height:        Intake/Output Summary (Last 24 hours) at 07/23/2019 1433 Last data filed at 07/23/2019 1404 Gross per 24 hour  Intake 804.39 ml  Output 400 ml  Net 404.39 ml   Last 3 Weights 07/22/2019 07/21/2019 07/19/2019  Weight (lbs) 215 lb 13.3 oz 214 lb 1.1 oz 219 lb 5.7 oz  Weight (kg) 97.9 kg 97.1 kg 99.5 kg      Telemetry    Atrial fib with sleep 69 with activity 135  - Personally Reviewed  ECG    07/22/19 a fib with rate control and nonspecific ST abnormalities. No acute changes - Personally Reviewed  Physical Exam  Per Dr. Oval Linsey   Labs    High Sensitivity Troponin:  No results for input(s): TROPONINIHS in the last 720 hours.    Chemistry Recent Labs  Lab  07/21/19 0340 07/22/19 1316 07/23/19 0342  NA 132* 134* 136  K 3.8 4.2 3.8  CL 103 100 102  CO2 25 25 24   GLUCOSE 225* 264* 123*  BUN 19 17 19   CREATININE 0.99 0.90 0.82  CALCIUM 7.9* 8.2* 8.5*  PROT 5.7* 6.1* 6.1*  ALBUMIN 2.1* 2.2* 2.2*  AST 36 30 36  ALT 32 34 35  ALKPHOS 120 133* 143*  BILITOT 0.3 0.7 0.6  GFRNONAA >60 >60 >60  GFRAA >60 >60 >60  ANIONGAP 4* 9 10     Hematology Recent Labs  Lab 07/21/19 0340 07/22/19 1316 07/23/19 0342  WBC 9.3 8.6 8.5  RBC 2.78* 3.08* 2.77*  HGB 7.8* 8.8* 8.0*  HCT 25.3* 28.0* 25.1*  MCV 91.0 90.9 90.6  MCH 28.1 28.6 28.9  MCHC 30.8 31.4 31.9  RDW 17.6* 17.6* 17.3*  PLT 252 303 284    BNPNo results for input(s): BNP, PROBNP in the last 168 hours.   DDimer No results for input(s): DDIMER in the last 168 hours.   Radiology    No results found.  Cardiac Studies   Echo 6/02/06/19: 1. Left ventricular ejection fraction, by estimation, is 55-60%. The left  ventricle has normal function. Left ventricular endocardial border not  optimally defined to evaluate regional wall motion even with definity  contrast. There is severe left  ventricular hypertrophy of the basal-septal segment. Left ventricular  diastolic parameters are indeterminate.  2. Right ventricular systolic function was not well visualized. The right  ventricular size is normal. There is normal pulmonary artery systolic  pressure. The estimated right ventricular systolic pressure is 38.3 mmHg.  3. The mitral valve is normal in structure. No evidence of mitral valve  regurgitation. No evidence of mitral stenosis.  4. The aortic valve is tricuspid. Aortic valve regurgitation is trivial.  Mild aortic valve sclerosis is present, with no evidence of aortic valve  stenosis.  5. Aortic dilatation noted. There is mild dilatation at the level of the  sinuses of Valsalva measuring 43 mm.  6. The inferior vena cava is dilated in size with <50% respiratory   variability, suggesting right atrial pressure of 15 mmHg.    Patient Profile     69 y.o. male with CAD s/p CABG in 2017, persistent atrial fibrillation, chronic systolic and diastolic heart failure (LVEF normalized), diabetes, and metastatic colon cancer.  Cardiology consulted for afib with RVR.  Assessment & Plan    # Persistent atrial fib/flutter:  Rates better controlled but still >100 bpm at times.  Rhythm control not an option given metastatic colorectal cancer.  Continue metoprolol and digoxin.   digoxin level therapeutic today did not receive AM doseof metoprolol of 100 due to BP.   # CAD s/p CABG:  Not an acitve issue.  S/p remote CABG.  No ASA 2/2 metastatic colon cancer.   # Orthostatic VS:  Patient was orthostatic yesterday.  Improved with IV fluids.  He is adamant about leaving.  yesterday orthostatic VS. Lying 118/65, p 118 sitting 66/39 P 81  Standing at 3 min 122/57 P 136.   # Chronic systolic and diastolic heart failure:  VEF imprved from 40-45% to 55-60% this admission.  Hypotensive as above.  No evidence of volume overload.       For questions or updates, please contact [place] Please consult [email or web address] for contact info under        Signed, Cecilie Kicks, NP  07/23/2019, 2:33 PM

## 2019-07-23 NOTE — TOC Progression Note (Signed)
Transition of Care (TOC) - Progression Note    Patient Details  Name: Clarence Andrews. MRN: [id number] Date of Birth: Feb 13, 1950  Transition of Care [facility]) CM/SW Contact  Purcell Mouton, RN Phone Number: 07/23/2019, 4:32 PM  Clinical Narrative:    A call to pt was made to encourage him to go to SNF. Pt continue to decline. Pt states,"My friend Swain Acree is coming Wednesday  To drive me home. I will stay with him Wednesday night and he will take me to my appointment Thursday." Pt now have a safe Discharge plan.    Expected Discharge Plan: [place] Barriers to Discharge: Continued Medical Work up  Expected Discharge Plan and Services Expected Discharge Plan: [place] Choice: [place] arrangements for the past 2 months: Single Family Home                                       Social Determinants of Health (SDOH) Interventions    Readmission Risk Interventions No flowsheet data found.

## 2019-07-23 NOTE — Progress Notes (Signed)
   07/22/19 1315  Assess: MEWS Score  BP 118/65  Pulse Rate (!) 129  Resp 18  Assess: MEWS Score  MEWS Temp 0  MEWS Systolic 0  MEWS Pulse 2  MEWS RR 0  MEWS LOC 0  MEWS Score 2  MEWS Score Color Yellow  Assess: if the MEWS score is Yellow or Red  Were vital signs taken at a resting state? Yes  Focused Assessment Documented focused assessment  Early Detection of Sepsis Score *See Row Information* Low  MEWS guidelines implemented *See Row Information* Yes  Treat  MEWS Interventions Administered scheduled meds/treatments  Take Vital Signs  Increase Vital Sign Frequency  Yellow: Q 2hr X 2 then Q 4hr X 2, if remains yellow, continue Q 4hrs  Escalate  MEWS: Escalate Yellow: discuss with charge nurse/RN and consider discussing with provider and RRT  Notify: Charge Nurse/RN  Name of Charge Nurse/RN Notified Sharyn Blitz RN  Date Charge Nurse/RN Notified 07/22/19  Time Charge Nurse/RN Notified 1353  Notify: Provider  Provider Name/Title Dr Florene Glen  Date Provider Notified 07/22/19  Time Provider Notified 1345  Notification Type Call  Notification Reason Change in status

## 2019-07-24 ENCOUNTER — Inpatient Hospital Stay: Payer: Medicare HMO

## 2019-07-24 ENCOUNTER — Inpatient Hospital Stay: Payer: Medicare HMO | Admitting: Nurse Practitioner

## 2019-07-24 DIAGNOSIS — I951 Orthostatic hypotension: Secondary | ICD-10-CM

## 2019-07-24 LAB — CBC WITH DIFFERENTIAL/PLATELET
Abs Immature Granulocytes: 0.02 10*3/uL (ref 0.00–0.07)
Basophils Absolute: 0.1 10*3/uL (ref 0.0–0.1)
Basophils Relative: 1 %
Eosinophils Absolute: 0.2 10*3/uL (ref 0.0–0.5)
Eosinophils Relative: 3 %
HCT: 25.2 % — ABNORMAL LOW (ref 39.0–52.0)
Hemoglobin: 7.9 g/dL — ABNORMAL LOW (ref 13.0–17.0)
Immature Granulocytes: 0 %
Lymphocytes Relative: 13 %
Lymphs Abs: 0.9 10*3/uL (ref 0.7–4.0)
MCH: 28.5 pg (ref 26.0–34.0)
MCHC: 31.3 g/dL (ref 30.0–36.0)
MCV: 91 fL (ref 80.0–100.0)
Monocytes Absolute: 0.9 10*3/uL (ref 0.1–1.0)
Monocytes Relative: 12 %
Neutro Abs: 5.1 10*3/uL (ref 1.7–7.7)
Neutrophils Relative %: 71 %
Platelets: 293 10*3/uL (ref 150–400)
RBC: 2.77 MIL/uL — ABNORMAL LOW (ref 4.22–5.81)
RDW: 17.2 % — ABNORMAL HIGH (ref 11.5–15.5)
WBC: 7.1 10*3/uL (ref 4.0–10.5)
nRBC: 0 % (ref 0.0–0.2)

## 2019-07-24 LAB — MAGNESIUM: Magnesium: 1.5 mg/dL — ABNORMAL LOW (ref 1.7–2.4)

## 2019-07-24 LAB — COMPREHENSIVE METABOLIC PANEL
ALT: 30 U/L (ref 0–44)
AST: 30 U/L (ref 15–41)
Albumin: 2.2 g/dL — ABNORMAL LOW (ref 3.5–5.0)
Alkaline Phosphatase: 149 U/L — ABNORMAL HIGH (ref 38–126)
Anion gap: 7 (ref 5–15)
BUN: 16 mg/dL (ref 8–23)
CO2: 26 mmol/L (ref 22–32)
Calcium: 8.1 mg/dL — ABNORMAL LOW (ref 8.9–10.3)
Chloride: 105 mmol/L (ref 98–111)
Creatinine, Ser: 0.91 mg/dL (ref 0.61–1.24)
GFR calc Af Amer: >60 mL/min (ref >60)
GFR calc non Af Amer: >60 mL/min (ref >60)
Glucose, Bld: 144 mg/dL — ABNORMAL HIGH (ref 70–99)
Potassium: 3.8 mmol/L (ref 3.5–5.1)
Sodium: 138 mmol/L (ref 135–145)
Total Bilirubin: 0.6 mg/dL (ref 0.3–1.2)
Total Protein: 6 g/dL — ABNORMAL LOW (ref 6.5–8.1)

## 2019-07-24 LAB — GLUCOSE, CAPILLARY
Glucose-Capillary: 141 mg/dL — ABNORMAL HIGH (ref 70–99)
Glucose-Capillary: 122 mg/dL — ABNORMAL HIGH (ref 70–99)

## 2019-07-24 LAB — PHOSPHORUS: Phosphorus: 3.1 mg/dL (ref 2.5–4.6)

## 2019-07-24 MED ORDER — MAGNESIUM SULFATE 2 GM/50ML IV SOLN
2.0000 g | Freq: Once | INTRAVENOUS | Status: AC
  Administered 2019-07-24: 2 g via INTRAVENOUS
  Filled 2019-07-24: qty 50

## 2019-07-24 MED ORDER — DILTIAZEM HCL 30 MG PO TABS
30.0000 mg | ORAL_TABLET | Freq: Once | ORAL | Status: DC
  Filled 2019-07-24: qty 1

## 2019-07-24 MED ORDER — HEPARIN SOD (PORK) LOCK FLUSH 100 UNIT/ML IV SOLN
500.0000 [IU] | INTRAVENOUS | Status: AC | PRN
  Administered 2019-07-24: 500 [IU]
  Filled 2019-07-24: qty 5

## 2019-07-24 MED ORDER — MIDODRINE HCL 5 MG PO TABS: 5.0000 mg | ORAL_TABLET | Freq: Three times a day (TID) | ORAL | 0 refills | Status: DC

## 2019-07-24 MED ORDER — DIGOXIN 250 MCG PO TABS: 0.2500 mg | ORAL_TABLET | Freq: Every day | ORAL | 1 refills | Status: DC

## 2019-07-24 MED FILL — Dexamethasone Sodium Phosphate Inj 100 MG/10ML: INTRAMUSCULAR | Qty: 1 | Status: AC

## 2019-07-24 NOTE — Progress Notes (Addendum)
2:23am - HR increased to 130s for approx 1 hour without any symptoms. Focused assessment placed in Epic. Paged Dr. Hal Hope.  He requested repeat VSs (esp. HR & BP)  3:46am - BP 117/80 map 91 HR 135.   New order for 30 mg cardizem PO once ordered and given. Will continue to monitor.

## 2019-07-24 NOTE — Discharge Summary (Signed)
Physician Discharge Summary  [name] CWC:[id number] DOB: Oct 08, 1950 DOA: 07/09/2019  PCP: Patient, No Pcp Per  Admit date: 07/09/2019 Discharge date: 07/24/2019  Recommendations for Outpatient Follow-up:  1. Follow-up atrial fibrillation with cardiology 2. Follow-up colonic obstruction with gastroenterology and general surgery   Follow-up Information    Leighton Ruff, MD. Go on 7/61/6073.   Specialty: General Surgery Why: 940am. Please arrive 30 minutes prior to your appointment for paperwork. Please bring a copy of your photo ID and insurance card.  Contact information: [address] [place] [address] [phone]        [facility]. Call.   Specialty: Gastroenterology Why: For follow up for your colonic stent  Contact information: [place] [id number] [phone]       Nahser, Wonda Cheng, MD Follow up on 08/02/2019.   Specialty: Cardiology Why: at 11:20 AM   Contact information: [address] [place] [place] [address] [phone]                Discharge Diagnoses: Principal diagnosis is #1 1. Persistent atrial fibrillation, flutter with rapid ventricular response 2. Presyncope, orthostatic hypotension, secondary to  vagal event following bowel movement 6/21 3. Hypomagnesemia 4. 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 Multifactorial anemia: anemia of chronic disease, chemotherapy, subclinical GI bleeding. 7. Chronic systolic CHF 8. Diabetes mellitus type 2   Discharge Condition: improved Disposition: home, pt refused HH and SNF  Diet recommendation: heart healthy  Filed Weights   07/19/19 0327 07/21/19 0354 07/22/19 0414  Weight: 99.5 kg 97.1 kg 97.9 kg    History of present illness:  69 year old man PMH including persistent  atrial fibrillation/flutter not on anticoagulation secondary to rectal bleeding, chronic systolic CHF, metastatic colon cancer presented from oncology office for atrial fibrillation with rapid ventricular response.    Hospital Course:  Developed significant nausea and vomiting with CT evidence of obstruction of sigmoid from colonic mass.  Seen by gastroenterology and underwent stenting.  Hospitalization prolonged by difficult to control rapid ventricular response, orthostatic hypotension, concern for discharge plan.  Rate control is complicated by bradycardia at night.  Lives alone.  Therapy recommended SNF.  Patient refused.   Persistent atrial fibrillation, flutter with rapid ventricular response. CHA2DS2-VASc 4. Repeat echocardiogram LVEF 55-60%. --Seen by cardiology.  Rates quite difficult to control.  IV amiodarone stopped secondary to nausea and vomiting.  Rhythm control not an option secondary to metastatic colorectal cancer.  Metoprolol increased 100 mg twice daily.  Digoxin increased to 0.25 mg with therapeutic dig level. --Not on anticoagulation secondary to risk of rectal bleeding in the context of colorectal cancer, stopped by Dr. [name] in the outpatient setting.  Presyncope, orthostatic hypotension, secondary to  vagal event following bowel movement 6/21. 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  Abdominal x-ray 6/8 showed possible ileus.  CT abdomen pelvis 6/9 showed severe/obstructing malignant stricture of sigmoid colon.  Seen same day by general surgery and GI.  Improved with NG tube placement. --Status post colonic stent 6/11 with resolution of obstruction.  GI advance to diet. --Outpatient  follow-up with colorectal surgery will be arranged  by [facility]. --Low residue diet.  No fiber.  Colace 200 mg daily.  MiraLAX daily. --Outpatient follow-up with Dr. 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 --Stable follow-up as an outpatient  Chronic systolic CHF, previous LVEF 40-45%.  Echocardiogram this admission LVEF 55-60%. --Continue beta-blocker.  Has not required diuretic therapy, was not on diuretic therapy prior to admission.  Diabetes mellitus type 2 on glipizide as an outpatient.  Last hemoglobin A1c 7.8%. --Resume glipizide on discharge  Tick bite.  Tick found on chest 6/16, removed with tweezers.  Treated with doxycycline 200 mg x 1. Significant Hospital Events    6/8 admitted for atrial fibrillation with rapid ventricular response  Consults:   6/8 cardiology  6/9 oncology  6/9 general surgery.  Signed off 6/14.  6/9 gastroenterology.  Signed off 6/12.  Procedures:   6/11 flexible sigmoidoscopy Showed malignant completely obstructing tumor proximal to anus.  Enteral stent placed.  Significant Diagnostic Tests:   CT abdomen pelvis severe and obstructing malignant stricture sigmoid colon  Today's assessment: S: Insisting on going home.  Feels fine.  No lightheadedness, no complaints. O: Vitals:  Vitals:   07/24/19 0924 07/24/19 1328  BP: 126/73 129/70  Pulse: 67 65  Resp:  16  Temp: 97.9 F (36.6 C) 97.9 F (36.6 C)  SpO2:  100%   Constitutional.  Appears calm, comfortable. Respiratory.  Clear to auscultation bilaterally.  No wheezes, rales or rhonchi.  Normal respiratory effort. Cardiovascular.  Regular rate and rhythm.  No murmur, rub or gallop.  No lower extremity edema.  Telemetry controlled ventricular rate in the 60s.  Appears to be atrial flutter. Psychiatric.  Grossly normal mood and affect.  Speech fluent and appropriate.  Today's  Data   BMP unremarkable  Magnesium 1.5  Hemoglobin stable at 7.9   Discharge Instructions  Discharge Instructions    Diet - low sodium heart healthy   Complete by: As directed    Discharge instructions   Complete by: As directed    Call your physician or seek immediate medical attention for palpitations, dizziness, falls, passing out, weakness, shortness of breath or worsening of condition.   Increase activity slowly   Complete by: As directed      Allergies as of 07/24/2019   No Known Allergies     Medication List    STOP taking these medications   diltiazem 30 MG tablet Commonly known as: Cardizem   Eliquis 5 MG Tabs tablet Generic drug: apixaban   prochlorperazine 10 MG tablet Commonly known as: COMPAZINE     TAKE these medications   ascorbic acid 500 MG tablet Commonly known as: VITAMIN C Take 1 tablet (500 mg total) by mouth daily.   Chromium-Cinnamon [id number] MCG-MG Caps Take 2,000 mg by mouth daily.   D3-1000 PO Take 1,000 Units by mouth daily.   digoxin 0.25 MG tablet Commonly known as: LANOXIN Take 1 tablet (0.25 mg total) by mouth daily. Start taking on: July 25, 2019 What changed:   medication strength  how much to take   ferrous sulfate 325 (65 FE) MG EC tablet Take 325 mg by mouth daily with breakfast.   glipiZIDE 10 MG tablet Commonly known as: GLUCOTROL Take 10 mg by mouth daily before breakfast.   levalbuterol 45 MCG/ACT inhaler Commonly known as: XOPENEX HFA Inhale 2 puffs into the lungs every 6 (six) hours as needed for wheezing.   lidocaine-prilocaine cream Commonly  known as: EMLA Apply to port site 1-2 hours prior to use   loperamide 2 MG capsule Commonly known as: IMODIUM Take 1 capsule by mouth 4 (four) times daily as needed for diarrhea or loose stools.   magnesium oxide 400 MG tablet Commonly known as: MAG-OX Take 800 mg by mouth daily.   metoprolol tartrate 100 MG tablet Commonly known as: LOPRESSOR Take 1  tablet (100 mg total) by mouth 2 (two) times daily.   midodrine 5 MG tablet Commonly known as: PROAMATINE Take 1 tablet (5 mg total) by mouth 3 (three) times daily with meals.   Turmeric 500 MG Caps Take 2,000 mg by mouth daily. Taking 2 - 1000 mg tabs daily   vitamin B-12 500 MCG tablet Commonly known as: CYANOCOBALAMIN Take 500 mcg by mouth daily.      No Known Allergies  The results of significant diagnostics from this hospitalization (including imaging, microbiology, ancillary and laboratory) are listed below for reference.    Significant Diagnostic Studies: DG Chest 1 View  Result Date: 07/09/2019 CLINICAL DATA:  Tachycardia and diffuse abdominal pain. EXAM: CHEST  1 VIEW COMPARISON:  04/28/2019 FINDINGS: Previous median sternotomy. Power port inserted from a right jugular approach with tip at the SVC RA junction. Heart size is upper limits of normal with some left ventricular prominence. No acute mediastinal finding. The pulmonary vascularity is normal. The lungs are clear. No infiltrate, collapse or effusion. IMPRESSION: No active disease. Electronically Signed   By: Nelson Chimes M.D.   On: 07/09/2019 14:21   DG Abd 1 View  Result Date: 07/10/2019 CLINICAL DATA:  Nasogastric tube placement. EXAM: ABDOMEN - 1 VIEW COMPARISON:  July 09, 2019 (1:53 p.m.) FINDINGS: Multiple sternal wires and sternal fixation plates and screws are seen. A nasogastric tube is seen with its distal tip overlying the body of the stomach. Numerous dilated small bowel loops are again seen throughout the abdomen and pelvis. No radio-opaque calculi or other significant radiographic abnormality are seen. IMPRESSION: Nasogastric tube positioning, as described above. Electronically Signed   By: Virgina Norfolk M.D.   On: 07/10/2019 17:56   DG Abdomen 1 View  Result Date: 07/09/2019 CLINICAL DATA:  Tachycardia.  Diffuse abdominal pain. EXAM: ABDOMEN - 1 VIEW COMPARISON:  None. FINDINGS: There is gas throughout  small and large bowel most consistent with ileus. No focal dilatation to suggest obstruction. No free air. No abnormal calcifications. Ordinary degenerative changes affect the spine. IMPRESSION: Possible ileus pattern. No evidence of frank obstruction or focal lesion. Electronically Signed   By: Nelson Chimes M.D.   On: 07/09/2019 14:22   CT ABDOMEN PELVIS W CONTRAST  Result Date: 07/10/2019 CLINICAL DATA:  Rectal tumor. Abdominal distention. No bowel movement for days. EXAM: CT ABDOMEN AND PELVIS WITH CONTRAST TECHNIQUE: Multidetector CT imaging of the abdomen and pelvis was performed using the standard protocol following bolus administration of intravenous contrast. CONTRAST:  152mL OMNIPAQUE IOHEXOL 300 MG/ML  SOLN COMPARISON:  03/15/2019 FINDINGS: Lower chest: Atelectasis and patchy airspace opacity in the lower lungs. Atherosclerosis and postoperative heart. Hepatobiliary: Multifocal hepatic metastatic disease with ill-defined masses measuring up to 4 cm in the posterior right liver, roughly 5 mm smaller than the before. Evidence of biliary obstruction or stone. Pancreas: Diffuse fatty atrophy with some preservation of density at the tail. There could be a cyst at the uncinate process, incidental in this setting. Spleen: Unremarkable. Adrenals/Urinary Tract: Negative adrenals. No hydronephrosis or stone. Simple bilateral renal cysts. Unremarkable bladder. Stomach/Bowel: Known  sigmoid mass with severe luminal stenosis. The adjacent wall thickening is less thickened, but there is still spiculated density extending through the serosa into the mesentery. Above this narrowing there is generalized gas and fluid dilated bowel. Vascular/Lymphatic: No acute vascular finding. Decrease in size of IMA lymph nodes. Reproductive:No pathologic findings. Other: No ascites or pneumoperitoneum. Musculoskeletal: No acute abnormalities. IMPRESSION: 1. Severe and obstructing malignant stricture at the sigmoid colon where there is  still extra serosal spiculated density extending into the mesentery. 2. Multifocal hepatic metastatic disease which is stable or slightly improved from February 2021. Electronically Signed   By: Monte Fantasia M.D.   On: 07/10/2019 11:32   DG Abd 2 Views  Result Date: 07/13/2019 CLINICAL DATA:  Status post colonic stent placement EXAM: ABDOMEN - 2 VIEW COMPARISON:  July 10, 2019 FINDINGS: There is an apparent stent in the sigmoid colon region overlying the sacrum. There is no appreciable bowel dilatation or air-fluid level to suggest bowel obstruction. No free air. Nasogastric tube tip and side port in stomach. There are surgical clips in the upper abdomen. Lung bases are clear. IMPRESSION: Apparent stent in the sigmoid colon region overlying the sacrum. Nasogastric tube tip and side port in stomach. No bowel obstruction or free air. Lung bases clear. Electronically Signed   By: Lowella Grip III M.D.   On: 07/13/2019 11:44   DG C-Arm 1-60 Min-No Report  Result Date: 07/12/2019 Fluoroscopy was utilized by the requesting physician.  No radiographic interpretation.   ECHOCARDIOGRAM LIMITED  Result Date: 07/17/2019    ECHOCARDIOGRAM LIMITED REPORT   Patient Name:   Adekunle Rohrbach. Date of Exam: 07/17/2019 Medical Rec #:  [id number]          Height:       74.0 in Accession #:    [id number]         Weight:       218.9 lb Date of Birth:  12/19/1950           BSA:          2.259 m Patient Age:    32 years           BP:           117/54 mmHg Patient Gender: M                  HR:           98 bpm. Exam Location:  Inpatient Procedure: Limited Echo, Intracardiac Opacification Agent, Color Doppler and            Cardiac Doppler Indications:    Congestive Heart Failure 428.0 / I50.9  History:        Patient has prior history of Echocardiogram examinations, most                 recent 04/28/2019. CAD, Prior CABG; Risk Factors:Diabetes.                 Malignant neoplasm of sigmoid colon, history of throat cancer.   Sonographer:    Darlina Sicilian RDCS Referring Phys: [id number] Nadean Corwin A Osgood  1. Left ventricular ejection fraction, by estimation, is 55-60%. The left ventricle has normal function. Left ventricular endocardial border not optimally defined to evaluate regional wall motion even with definity contrast. There is severe left ventricular hypertrophy of the basal-septal segment. Left ventricular diastolic parameters are indeterminate.  2. Right ventricular systolic function was not well visualized. The right ventricular size is normal.  There is normal pulmonary artery systolic pressure. The estimated right ventricular systolic pressure is 45.4 mmHg.  3. The mitral valve is normal in structure. No evidence of mitral valve regurgitation. No evidence of mitral stenosis.  4. The aortic valve is tricuspid. Aortic valve regurgitation is trivial. Mild aortic valve sclerosis is present, with no evidence of aortic valve stenosis.  5. Aortic dilatation noted. There is mild dilatation at the level of the sinuses of Valsalva measuring 43 mm.  6. The inferior vena cava is dilated in size with <50% respiratory variability, suggesting right atrial pressure of 15 mmHg. FINDINGS  Left Ventricle: Left ventricular ejection fraction, by estimation, is 55 to 60%. The left ventricle has normal function. Left ventricular endocardial border not optimally defined to evaluate regional wall motion. The left ventricular internal cavity size was normal in size. There is severe left ventricular hypertrophy of the basal-septal segment. Right Ventricle: The right ventricular size is normal. No increase in right ventricular wall thickness. Right ventricular systolic function was not well visualized. There is normal pulmonary artery systolic pressure. The tricuspid regurgitant velocity is  1.36 m/s, and with an assumed right atrial pressure of 15 mmHg, the estimated right ventricular systolic pressure is 09.8 mmHg. Left Atrium: Left atrial  size was normal in size. Right Atrium: Right atrial size was normal in size. Pericardium: There is no evidence of pericardial effusion. Mitral Valve: The mitral valve is normal in structure. Normal mobility of the mitral valve leaflets. No evidence of mitral valve stenosis. Tricuspid Valve: The tricuspid valve is normal in structure. Tricuspid valve regurgitation is trivial. No evidence of tricuspid stenosis. Aortic Valve: The aortic valve is tricuspid. Aortic valve regurgitation is trivial. Mild aortic valve sclerosis is present, with no evidence of aortic valve stenosis. Pulmonic Valve: The pulmonic valve was normal in structure. Pulmonic valve regurgitation is not visualized. No evidence of pulmonic stenosis. Aorta: Aortic dilatation noted. There is mild dilatation at the level of the sinuses of Valsalva measuring 43 mm. Venous: The inferior vena cava is dilated in size with less than 50% respiratory variability, suggesting right atrial pressure of 15 mmHg. IAS/Shunts: No atrial level shunt detected by color flow Doppler.  LEFT VENTRICLE PLAX 2D LVIDd:         4.30 cm LVIDs:         3.55 cm LV PW:         1.05 cm LV IVS:        1.80 cm LVOT diam:     2.20 cm LV SV:         48 LV SV Index:   21 LVOT Area:     3.80 cm  LV Volumes (MOD) LV vol d, MOD A2C: 82.9 ml LV vol d, MOD A4C: 173.0 ml LV vol s, MOD A2C: 56.7 ml LV vol s, MOD A4C: 88.6 ml LV SV MOD A2C:     26.2 ml LV SV MOD A4C:     173.0 ml LV SV MOD BP:      51.9 ml LEFT ATRIUM         Index LA diam:    2.40 cm 1.06 cm/m  AORTIC VALVE LVOT Vmax:   75.00 cm/s LVOT Vmean:  57.000 cm/s LVOT VTI:    0.126 m  AORTA Ao Root diam: 4.30 cm MITRAL VALVE               TRICUSPID VALVE MV Area (PHT): 4.96 cm    TR Peak grad:  7.4 mmHg MV Decel Time: 153 msec    TR Vmax:        136.00 cm/s MV E velocity: 95.20 cm/s                            SHUNTS                            Systemic VTI:  0.13 m                            Systemic Diam: 2.20 cm Fransico Him MD  Electronically signed by Fransico Him MD Signature Date/Time: 07/17/2019/10:43:14 AM    Final     Microbiology: Recent Results (from the past 240 hour(s))  C Difficile Quick Screen (NO PCR Reflex)     Status: None   Collection Time: 07/23/19  4:06 PM   Specimen: STOOL  Result Value Ref Range Status   C Diff antigen NEGATIVE NEGATIVE Final   C Diff toxin NEGATIVE NEGATIVE Final   C Diff interpretation No C. difficile detected.  Final    Comment: Performed at [facility], [address] [place], [place] [id number]     Labs: Basic Metabolic Panel: Recent Labs  Lab 07/20/19 0300 07/21/19 0340 07/22/19 1316 07/23/19 0342 07/24/19 0354  NA 134* 132* 134* 136 138  K 3.4* 3.8 4.2 3.8 3.8  CL 101 103 100 102 105  CO2 27 25 25 24 26   GLUCOSE 180* 225* 264* 123* 144*  BUN 19 19 17 19 16   CREATININE 1.15 0.99 0.90 0.82 0.91  CALCIUM 8.1* 7.9* 8.2* 8.5* 8.1*  MG 1.4* 1.7 1.6* 1.8 1.5*  PHOS 2.4* 2.1* 2.3* 2.8 3.1   Liver Function Tests: Recent Labs  Lab 07/20/19 0300 07/21/19 0340 07/22/19 1316 07/23/19 0342 07/24/19 0354  AST 30 36 30 36 30  ALT 30 32 34 35 30  ALKPHOS 126 120 133* 143* 149*  BILITOT 0.5 0.3 0.7 0.6 0.6  PROT 6.0* 5.7* 6.1* 6.1* 6.0*  ALBUMIN 2.2* 2.1* 2.2* 2.2* 2.2*   CBC: Recent Labs  Lab 07/20/19 0300 07/21/19 0340 07/22/19 1316 07/23/19 0342 07/24/19 0354  WBC 8.7 9.3 8.6 8.5 7.1  NEUTROABS 6.3 7.0 6.6 6.3 5.1  HGB 8.1* 7.8* 8.8* 8.0* 7.9*  HCT 25.9* 25.3* 28.0* 25.1* 25.2*  MCV 92.2 91.0 90.9 90.6 91.0  PLT 235 252 303 284 293    CBG: Recent Labs  Lab 07/23/19 1200 07/23/19 1659 07/23/19 2106 07/24/19 0754 07/24/19 1153  GLUCAP 155* 158* 145* 141* 122*    Principal Problem:   Atrial fibrillation with RVR (HCC) Active Problems:   Cancer of left colon (HCC)   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HCC)   Type 2 diabetes mellitus without complication ([place])   CAD (coronary artery disease)   Chronic systolic  heart failure ([place])   Colonic obstruction (HCC)   Time coordinating discharge: 60 minutes  Signed:  Murray Hodgkins, MD  [facility]  07/24/2019, 1:49 PM

## 2019-07-24 NOTE — Progress Notes (Signed)
Progress Note  Patient Name: Clarence Andrews. Date of Encounter: 07/24/2019  [facility] Cardiologist: Mertie Moores, MD   Subjective   Pt wanting to go home. No chest pain or SOB.  Inpatient Medications    Scheduled Meds: . Chlorhexidine Gluconate Cloth  6 each Topical Q0600  . digoxin  0.25 mg Oral Daily  . diltiazem  30 mg Oral Once  . docusate sodium  200 mg Oral BID  . insulin aspart  0-15 Units Subcutaneous TID WC  . insulin aspart  0-5 Units Subcutaneous QHS  . insulin glargine  10 Units Subcutaneous QHS  . metoprolol tartrate  100 mg Oral BID  . midodrine  5 mg Oral TID WC  . pantoprazole  40 mg Oral QHS  . sodium chloride flush  10-40 mL Intracatheter Q12H   Continuous Infusions: . sodium chloride 10 mL/hr at 07/24/19 0304   PRN Meds: alum & mag hydroxide-simeth, levalbuterol, liver oil-zinc oxide, loperamide, ondansetron **OR** ondansetron (ZOFRAN) IV, phenol, polyethylene glycol, sodium chloride flush   Vital Signs    Vitals:   07/24/19 0348 07/24/19 0610 07/24/19 0619 07/24/19 0924  BP:  134/76 (!) 168/156 126/73  Pulse:  60 74 67  Resp: (!) 22     Temp:    97.9 F (36.6 C)  TempSrc:    Oral  SpO2:  100% 100%   Weight:      Height:        Intake/Output Summary (Last 24 hours) at 07/24/2019 1207 Last data filed at 07/24/2019 4854 Gross per 24 hour  Intake 282.87 ml  Output 1300 ml  Net -1017.13 ml   Last 3 Weights 07/22/2019 07/21/2019 07/19/2019  Weight (lbs) 215 lb 13.3 oz 214 lb 1.1 oz 219 lb 5.7 oz  Weight (kg) 97.9 kg 97.1 kg 99.5 kg      Telemetry     a fib overall improved HR but still up to 130s with exertion- Personally Reviewed  ECG    No new - Personally Reviewed  Physical Exam   GEN: No acute distress.   Neck: No JVD Cardiac: irreg irreg, no murmurs, rubs, or gallops.  Respiratory: Clear to auscultation bilaterally. GI: Soft, nontender, non-distended  MS: No edema; No deformity. Neuro:  Nonfocal  Psych: Normal  affect   Labs    High Sensitivity Troponin:  No results for input(s): TROPONINIHS in the last 720 hours.    Chemistry Recent Labs  Lab 07/22/19 1316 07/23/19 0342 07/24/19 0354  NA 134* 136 138  K 4.2 3.8 3.8  CL 100 102 105  CO2 25 24 26   GLUCOSE 264* 123* 144*  BUN 17 19 16   CREATININE 0.90 0.82 0.91  CALCIUM 8.2* 8.5* 8.1*  PROT 6.1* 6.1* 6.0*  ALBUMIN 2.2* 2.2* 2.2*  AST 30 36 30  ALT 34 35 30  ALKPHOS 133* 143* 149*  BILITOT 0.7 0.6 0.6  GFRNONAA >60 >60 >60  GFRAA >60 >60 >60  ANIONGAP 9 10 7      Hematology Recent Labs  Lab 07/22/19 1316 07/23/19 0342 07/24/19 0354  WBC 8.6 8.5 7.1  RBC 3.08* 2.77* 2.77*  HGB 8.8* 8.0* 7.9*  HCT 28.0* 25.1* 25.2*  MCV 90.9 90.6 91.0  MCH 28.6 28.9 28.5  MCHC 31.4 31.9 31.3  RDW 17.6* 17.3* 17.2*  PLT 303 284 293    BNPNo results for input(s): BNP, PROBNP in the last 168 hours.   DDimer No results for input(s): DDIMER in the last 168 hours.  Radiology    No results found.  Cardiac Studies   Echo 6/02/06/19: 1. Left ventricular ejection fraction, by estimation, is 55-60%. The left  ventricle has normal function. Left ventricular endocardial border not  optimally defined to evaluate regional wall motion even with definity  contrast. There is severe left  ventricular hypertrophy of the basal-septal segment. Left ventricular  diastolic parameters are indeterminate.  2. Right ventricular systolic function was not well visualized. The right  ventricular size is normal. There is normal pulmonary artery systolic  pressure. The estimated right ventricular systolic pressure is 53.2 mmHg.  3. The mitral valve is normal in structure. No evidence of mitral valve  regurgitation. No evidence of mitral stenosis.  4. The aortic valve is tricuspid. Aortic valve regurgitation is trivial.  Mild aortic valve sclerosis is present, with no evidence of aortic valve  stenosis.  5. Aortic dilatation noted. There is mild  dilatation at the level of the  sinuses of Valsalva measuring 43 mm.  6. The inferior vena cava is dilated in size with <50% respiratory  variability, suggesting right atrial pressure of 15 mmHg.    Patient Profile     69 y.o. male with CAD s/p CABG in 2017, persistent atrial fibrillation, chronic systolic and diastolic heart failure (LVEF normalized), diabetes, and metastatic colon cancer. Cardiology consulted for afib with RVR  Assessment & Plan      # Persistent atrial fib/flutter:  Rates better controlled but still >100 bpm at times. Rhythm control not an option given metastatic colorectal cancer. Continue metoprolol and digoxin.  digoxin level therapeutic today did not receive AM doseof metoprolol of 100 due to BP.  It has been given since with activity HR does increase along with drop in BP  # CAD s/p CABG:  Not an acitve issue. S/p remote CABG. No ASA 2/2 metastatic colon cancer.   # Orthostatic VS:  Patient was orthostatic yesterday. Improved with IV fluids. He is adamant about leaving. yesterday orthostatic VS. Lying 134/76, p 94 sitting 168/56 P 77  Standing 149/97 and HR 138 , standing at 3 min BP 80/55 and P 123 -midodrine has helped.    # Chronic systolic and diastolic heart failure:  VEF imprved from 40-45% to 55-60% this admission. Hypotensive as above. No evidence of volume overload.      For questions or updates, please contact [place] Please consult [email or web address] for contact info under        Signed, Cecilie Kicks, NP  07/24/2019, 12:07 PM

## 2019-07-25 ENCOUNTER — Inpatient Hospital Stay: Payer: Medicare HMO

## 2019-07-25 ENCOUNTER — Inpatient Hospital Stay: Payer: Medicare HMO | Admitting: Nurse Practitioner

## 2019-07-25 ENCOUNTER — Emergency Department (HOSPITAL_COMMUNITY): Payer: Medicare HMO

## 2019-07-25 ENCOUNTER — Inpatient Hospital Stay (HOSPITAL_COMMUNITY)
Admission: EM | Admit: 2019-07-25 | Discharge: 2019-08-02 | DRG: 690 | Disposition: A | Discharge status: Skilled Nursing Facility | Payer: Medicare HMO | Attending: Internal Medicine | Admitting: Internal Medicine

## 2019-07-25 ENCOUNTER — Encounter (HOSPITAL_COMMUNITY): Payer: Self-pay | Admitting: Emergency Medicine

## 2019-07-25 ENCOUNTER — Other Ambulatory Visit: Payer: Self-pay

## 2019-07-25 ENCOUNTER — Encounter: Payer: Self-pay | Admitting: *Deleted

## 2019-07-25 DIAGNOSIS — W19XXXA Unspecified fall, initial encounter: Secondary | ICD-10-CM | POA: Diagnosis not present

## 2019-07-25 DIAGNOSIS — I2510 Atherosclerotic heart disease of native coronary artery without angina pectoris: Secondary | ICD-10-CM | POA: Diagnosis present

## 2019-07-25 DIAGNOSIS — N179 Acute kidney failure, unspecified: Secondary | ICD-10-CM | POA: Diagnosis not present

## 2019-07-25 DIAGNOSIS — M6282 Rhabdomyolysis: Secondary | ICD-10-CM | POA: Diagnosis present

## 2019-07-25 DIAGNOSIS — W010XXA Fall on same level from slipping, tripping and stumbling without subsequent striking against object, initial encounter: Secondary | ICD-10-CM | POA: Diagnosis present

## 2019-07-25 DIAGNOSIS — R609 Edema, unspecified: Secondary | ICD-10-CM | POA: Diagnosis present

## 2019-07-25 DIAGNOSIS — Z8616 Personal history of COVID-19: Secondary | ICD-10-CM

## 2019-07-25 DIAGNOSIS — R296 Repeated falls: Secondary | ICD-10-CM | POA: Diagnosis present

## 2019-07-25 DIAGNOSIS — J449 Chronic obstructive pulmonary disease, unspecified: Secondary | ICD-10-CM | POA: Diagnosis present

## 2019-07-25 DIAGNOSIS — I5042 Chronic combined systolic (congestive) and diastolic (congestive) heart failure: Secondary | ICD-10-CM | POA: Diagnosis present

## 2019-07-25 DIAGNOSIS — I951 Orthostatic hypotension: Secondary | ICD-10-CM | POA: Diagnosis not present

## 2019-07-25 DIAGNOSIS — Z951 Presence of aortocoronary bypass graft: Secondary | ICD-10-CM | POA: Diagnosis not present

## 2019-07-25 DIAGNOSIS — R5381 Other malaise: Secondary | ICD-10-CM | POA: Diagnosis present

## 2019-07-25 DIAGNOSIS — N390 Urinary tract infection, site not specified: Principal | ICD-10-CM | POA: Diagnosis present

## 2019-07-25 DIAGNOSIS — C187 Malignant neoplasm of sigmoid colon: Secondary | ICD-10-CM | POA: Diagnosis present

## 2019-07-25 DIAGNOSIS — L89151 Pressure ulcer of sacral region, stage 1: Secondary | ICD-10-CM | POA: Diagnosis present

## 2019-07-25 DIAGNOSIS — Z23 Encounter for immunization: Secondary | ICD-10-CM

## 2019-07-25 DIAGNOSIS — I4891 Unspecified atrial fibrillation: Secondary | ICD-10-CM

## 2019-07-25 DIAGNOSIS — Z8249 Family history of ischemic heart disease and other diseases of the circulatory system: Secondary | ICD-10-CM

## 2019-07-25 DIAGNOSIS — I5022 Chronic systolic (congestive) heart failure: Secondary | ICD-10-CM

## 2019-07-25 DIAGNOSIS — M7989 Other specified soft tissue disorders: Secondary | ICD-10-CM | POA: Diagnosis not present

## 2019-07-25 DIAGNOSIS — Z20822 Contact with and (suspected) exposure to covid-19: Secondary | ICD-10-CM | POA: Diagnosis present

## 2019-07-25 DIAGNOSIS — Z7984 Long term (current) use of oral hypoglycemic drugs: Secondary | ICD-10-CM

## 2019-07-25 DIAGNOSIS — Z85810 Personal history of malignant neoplasm of tongue: Secondary | ICD-10-CM

## 2019-07-25 DIAGNOSIS — R000 Tachycardia, unspecified: Secondary | ICD-10-CM | POA: Diagnosis not present

## 2019-07-25 DIAGNOSIS — C186 Malignant neoplasm of descending colon: Secondary | ICD-10-CM | POA: Diagnosis not present

## 2019-07-25 DIAGNOSIS — E119 Type 2 diabetes mellitus without complications: Secondary | ICD-10-CM | POA: Diagnosis present

## 2019-07-25 DIAGNOSIS — I479 Paroxysmal tachycardia, unspecified: Secondary | ICD-10-CM | POA: Diagnosis not present

## 2019-07-25 DIAGNOSIS — I483 Typical atrial flutter: Secondary | ICD-10-CM | POA: Diagnosis not present

## 2019-07-25 DIAGNOSIS — R531 Weakness: Secondary | ICD-10-CM | POA: Diagnosis not present

## 2019-07-25 DIAGNOSIS — R0902 Hypoxemia: Secondary | ICD-10-CM | POA: Diagnosis not present

## 2019-07-25 DIAGNOSIS — M2550 Pain in unspecified joint: Secondary | ICD-10-CM | POA: Diagnosis not present

## 2019-07-25 DIAGNOSIS — I4892 Unspecified atrial flutter: Secondary | ICD-10-CM | POA: Diagnosis present

## 2019-07-25 DIAGNOSIS — Z7401 Bed confinement status: Secondary | ICD-10-CM | POA: Diagnosis not present

## 2019-07-25 DIAGNOSIS — I4819 Other persistent atrial fibrillation: Secondary | ICD-10-CM | POA: Diagnosis not present

## 2019-07-25 DIAGNOSIS — Z03818 Encounter for observation for suspected exposure to other biological agents ruled out: Secondary | ICD-10-CM | POA: Diagnosis not present

## 2019-07-25 DIAGNOSIS — D010 Carcinoma in situ of colon: Secondary | ICD-10-CM | POA: Diagnosis not present

## 2019-07-25 DIAGNOSIS — R319 Hematuria, unspecified: Secondary | ICD-10-CM | POA: Diagnosis present

## 2019-07-25 DIAGNOSIS — Z9221 Personal history of antineoplastic chemotherapy: Secondary | ICD-10-CM

## 2019-07-25 DIAGNOSIS — Z8589 Personal history of malignant neoplasm of other organs and systems: Secondary | ICD-10-CM

## 2019-07-25 DIAGNOSIS — L03115 Cellulitis of right lower limb: Secondary | ICD-10-CM | POA: Diagnosis not present

## 2019-07-25 DIAGNOSIS — C189 Malignant neoplasm of colon, unspecified: Secondary | ICD-10-CM | POA: Diagnosis not present

## 2019-07-25 DIAGNOSIS — I482 Chronic atrial fibrillation, unspecified: Secondary | ICD-10-CM | POA: Diagnosis not present

## 2019-07-25 DIAGNOSIS — D630 Anemia in neoplastic disease: Secondary | ICD-10-CM | POA: Diagnosis present

## 2019-07-25 DIAGNOSIS — L8990 Pressure ulcer of unspecified site, unspecified stage: Secondary | ICD-10-CM | POA: Insufficient documentation

## 2019-07-25 DIAGNOSIS — E860 Dehydration: Secondary | ICD-10-CM | POA: Diagnosis present

## 2019-07-25 DIAGNOSIS — M545 Low back pain: Secondary | ICD-10-CM | POA: Diagnosis not present

## 2019-07-25 DIAGNOSIS — C787 Secondary malignant neoplasm of liver and intrahepatic bile duct: Secondary | ICD-10-CM | POA: Diagnosis not present

## 2019-07-25 DIAGNOSIS — Z79899 Other long term (current) drug therapy: Secondary | ICD-10-CM

## 2019-07-25 DIAGNOSIS — S99911A Unspecified injury of right ankle, initial encounter: Secondary | ICD-10-CM | POA: Diagnosis not present

## 2019-07-25 DIAGNOSIS — L03116 Cellulitis of left lower limb: Secondary | ICD-10-CM | POA: Diagnosis not present

## 2019-07-25 DIAGNOSIS — S8991XA Unspecified injury of right lower leg, initial encounter: Secondary | ICD-10-CM | POA: Diagnosis not present

## 2019-07-25 HISTORY — DX: Rhabdomyolysis: M62.82

## 2019-07-25 HISTORY — DX: Cellulitis of right lower limb: L03.115

## 2019-07-25 LAB — BASIC METABOLIC PANEL
Anion gap: 15 (ref 5–15)
BUN: 28 mg/dL — ABNORMAL HIGH (ref 8–23)
CO2: 21 mmol/L — ABNORMAL LOW (ref 22–32)
Calcium: 8.8 mg/dL — ABNORMAL LOW (ref 8.9–10.3)
Chloride: 100 mmol/L (ref 98–111)
Creatinine, Ser: 1.84 mg/dL — ABNORMAL HIGH (ref 0.61–1.24)
GFR calc Af Amer: 42 mL/min — ABNORMAL LOW (ref >60)
GFR calc non Af Amer: 36 mL/min — ABNORMAL LOW (ref >60)
Glucose, Bld: 242 mg/dL — ABNORMAL HIGH (ref 70–99)
Potassium: 4.4 mmol/L (ref 3.5–5.1)
Sodium: 136 mmol/L (ref 135–145)

## 2019-07-25 LAB — CBC WITH DIFFERENTIAL/PLATELET
Abs Immature Granulocytes: 0.09 10*3/uL — ABNORMAL HIGH (ref 0.00–0.07)
Basophils Absolute: 0 10*3/uL (ref 0.0–0.1)
Basophils Relative: 0 %
Eosinophils Absolute: 0 10*3/uL (ref 0.0–0.5)
Eosinophils Relative: 0 %
HCT: 30 % — ABNORMAL LOW (ref 39.0–52.0)
Hemoglobin: 9.2 g/dL — ABNORMAL LOW (ref 13.0–17.0)
Immature Granulocytes: 1 %
Lymphocytes Relative: 5 %
Lymphs Abs: 0.9 10*3/uL (ref 0.7–4.0)
MCH: 28.3 pg (ref 26.0–34.0)
MCHC: 30.7 g/dL (ref 30.0–36.0)
MCV: 92.3 fL (ref 80.0–100.0)
Monocytes Absolute: 1.4 10*3/uL — ABNORMAL HIGH (ref 0.1–1.0)
Monocytes Relative: 9 %
Neutro Abs: 13.8 10*3/uL — ABNORMAL HIGH (ref 1.7–7.7)
Neutrophils Relative %: 85 %
Platelets: 348 10*3/uL (ref 150–400)
RBC: 3.25 MIL/uL — ABNORMAL LOW (ref 4.22–5.81)
RDW: 17.2 % — ABNORMAL HIGH (ref 11.5–15.5)
WBC: 16.3 10*3/uL — ABNORMAL HIGH (ref 4.0–10.5)
nRBC: 0 % (ref 0.0–0.2)

## 2019-07-25 LAB — URINALYSIS, ROUTINE W REFLEX MICROSCOPIC
Bilirubin Urine: NEGATIVE
Glucose, UA: 50 mg/dL — AB
Ketones, ur: 5 mg/dL — AB
Nitrite: NEGATIVE
Protein, ur: NEGATIVE mg/dL
Specific Gravity, Urine: 1.018 (ref 1.005–1.030)
WBC, UA: >50 WBC/hpf — ABNORMAL HIGH (ref 0–5)
pH: 5 (ref 5.0–8.0)

## 2019-07-25 LAB — GLUCOSE, CAPILLARY: Glucose-Capillary: 206 mg/dL — ABNORMAL HIGH (ref 70–99)

## 2019-07-25 LAB — CK: Total CK: 1241 U/L — ABNORMAL HIGH (ref 49–397)

## 2019-07-25 LAB — SARS CORONAVIRUS 2 BY RT PCR (HOSPITAL ORDER, PERFORMED IN ~~LOC~~ HOSPITAL LAB): SARS Coronavirus 2: NEGATIVE

## 2019-07-25 LAB — CBG MONITORING, ED: Glucose-Capillary: 172 mg/dL — ABNORMAL HIGH (ref 70–99)

## 2019-07-25 MED ORDER — ONDANSETRON HCL 4 MG PO TABS: 4.0000 mg | ORAL_TABLET | Freq: Four times a day (QID) | ORAL | Status: DC | PRN

## 2019-07-25 MED ORDER — MIDODRINE HCL 5 MG PO TABS
5.0000 mg | ORAL_TABLET | Freq: Three times a day (TID) | ORAL | Status: DC
  Administered 2019-07-25 – 2019-08-02 (×23): 5 mg via ORAL
  Filled 2019-07-25 (×27): qty 1

## 2019-07-25 MED ORDER — LEVALBUTEROL TARTRATE 45 MCG/ACT IN AERO
2.0000 | INHALATION_SPRAY | Freq: Four times a day (QID) | RESPIRATORY_TRACT | Status: DC | PRN
  Filled 2019-07-25: qty 15

## 2019-07-25 MED ORDER — HEPARIN SODIUM (PORCINE) 5000 UNIT/ML IJ SOLN
5000.0000 [IU] | Freq: Three times a day (TID) | INTRAMUSCULAR | Status: DC
  Administered 2019-07-25 – 2019-08-02 (×25): 5000 [IU] via SUBCUTANEOUS
  Filled 2019-07-25 (×25): qty 1

## 2019-07-25 MED ORDER — CEFAZOLIN SODIUM-DEXTROSE 1-4 GM/50ML-% IV SOLN
1.0000 g | Freq: Three times a day (TID) | INTRAVENOUS | Status: DC
  Administered 2019-07-25 – 2019-07-31 (×18): 1 g via INTRAVENOUS
  Filled 2019-07-25 (×22): qty 50

## 2019-07-25 MED ORDER — INSULIN ASPART 100 UNIT/ML ~~LOC~~ SOLN
0.0000 [IU] | Freq: Three times a day (TID) | SUBCUTANEOUS | Status: DC
  Administered 2019-07-25: 2 [IU] via SUBCUTANEOUS
  Administered 2019-07-26: 1 [IU] via SUBCUTANEOUS
  Administered 2019-07-26: 3 [IU] via SUBCUTANEOUS
  Administered 2019-07-26: 5 [IU] via SUBCUTANEOUS
  Administered 2019-07-27: 2 [IU] via SUBCUTANEOUS
  Administered 2019-07-27 – 2019-07-28 (×3): 1 [IU] via SUBCUTANEOUS
  Administered 2019-07-29: 2 [IU] via SUBCUTANEOUS
  Administered 2019-07-31: 1 [IU] via SUBCUTANEOUS
  Administered 2019-08-01: 5 [IU] via SUBCUTANEOUS
  Administered 2019-08-01 (×2): 7 [IU] via SUBCUTANEOUS
  Administered 2019-08-02: 2 [IU] via SUBCUTANEOUS
  Administered 2019-08-02: 3 [IU] via SUBCUTANEOUS
  Administered 2019-08-02: 2 [IU] via SUBCUTANEOUS
  Filled 2019-07-25: qty 0.09

## 2019-07-25 MED ORDER — CHLORHEXIDINE GLUCONATE CLOTH 2 % EX PADS
6.0000 | MEDICATED_PAD | Freq: Every day | CUTANEOUS | Status: DC
  Administered 2019-07-26 – 2019-08-02 (×7): 6 via TOPICAL

## 2019-07-25 MED ORDER — METOPROLOL TARTRATE 50 MG PO TABS
100.0000 mg | ORAL_TABLET | Freq: Two times a day (BID) | ORAL | Status: DC
  Administered 2019-07-25 – 2019-08-01 (×15): 100 mg via ORAL
  Filled 2019-07-25 (×3): qty 4
  Filled 2019-07-25: qty 8
  Filled 2019-07-25: qty 4
  Filled 2019-07-25: qty 2
  Filled 2019-07-25 (×2): qty 4
  Filled 2019-07-25: qty 2
  Filled 2019-07-25 (×3): qty 4
  Filled 2019-07-25: qty 2
  Filled 2019-07-25: qty 4
  Filled 2019-07-25: qty 2
  Filled 2019-07-25: qty 4

## 2019-07-25 MED ORDER — TETANUS-DIPHTH-ACELL PERTUSSIS 5-2.5-18.5 LF-MCG/0.5 IM SUSP
0.5000 mL | Freq: Once | INTRAMUSCULAR | Status: AC
  Administered 2019-07-25: 0.5 mL via INTRAMUSCULAR
  Filled 2019-07-25: qty 0.5

## 2019-07-25 MED ORDER — MAGNESIUM OXIDE 400 MG PO TABS: 800.0000 mg | ORAL_TABLET | Freq: Every day | ORAL | Status: DC

## 2019-07-25 MED ORDER — SODIUM CHLORIDE 0.9 % IV SOLN: INTRAVENOUS | Status: DC

## 2019-07-25 MED ORDER — LACTATED RINGERS IV BOLUS
1000.0000 mL | Freq: Once | INTRAVENOUS | Status: AC
  Administered 2019-07-25: 1000 mL via INTRAVENOUS

## 2019-07-25 MED ORDER — FERROUS SULFATE 325 (65 FE) MG PO TABS
325.0000 mg | ORAL_TABLET | Freq: Every day | ORAL | Status: DC
  Administered 2019-07-26 – 2019-08-02 (×8): 325 mg via ORAL
  Filled 2019-07-25 (×8): qty 1

## 2019-07-25 MED ORDER — SENNA 8.6 MG PO TABS
1.0000 | ORAL_TABLET | Freq: Two times a day (BID) | ORAL | Status: DC
  Administered 2019-07-25 – 2019-07-31 (×10): 8.6 mg via ORAL
  Filled 2019-07-25 (×16): qty 1

## 2019-07-25 MED ORDER — DIGOXIN 125 MCG PO TABS
0.2500 mg | ORAL_TABLET | Freq: Every day | ORAL | Status: DC
  Administered 2019-07-25 – 2019-08-01 (×8): 0.25 mg via ORAL
  Filled 2019-07-25 (×5): qty 2
  Filled 2019-07-25: qty 1
  Filled 2019-07-25 (×2): qty 2
  Filled 2019-07-25: qty 1
  Filled 2019-07-25: qty 2
  Filled 2019-07-25 (×5): qty 1

## 2019-07-25 MED ORDER — ACETAMINOPHEN 325 MG PO TABS
650.0000 mg | ORAL_TABLET | Freq: Four times a day (QID) | ORAL | Status: DC | PRN
  Administered 2019-07-26: 650 mg via ORAL
  Filled 2019-07-25: qty 2

## 2019-07-25 MED ORDER — LOPERAMIDE HCL 2 MG PO CAPS: 2.0000 mg | ORAL_CAPSULE | Freq: Four times a day (QID) | ORAL | Status: DC | PRN

## 2019-07-25 MED ORDER — METOPROLOL TARTRATE 5 MG/5ML IV SOLN
5.0000 mg | Freq: Once | INTRAVENOUS | Status: AC
  Administered 2019-07-25: 5 mg via INTRAVENOUS
  Filled 2019-07-25: qty 5

## 2019-07-25 MED ORDER — SODIUM CHLORIDE 0.9 % IV SOLN: INTRAVENOUS | Status: AC

## 2019-07-25 MED ORDER — SENNOSIDES-DOCUSATE SODIUM 8.6-50 MG PO TABS: 1.0000 | ORAL_TABLET | Freq: Every evening | ORAL | Status: DC | PRN

## 2019-07-25 MED ORDER — ONDANSETRON HCL 4 MG/2ML IJ SOLN: 4.0000 mg | Freq: Four times a day (QID) | INTRAMUSCULAR | Status: DC | PRN

## 2019-07-25 MED ORDER — ACETAMINOPHEN 650 MG RE SUPP: 650.0000 mg | Freq: Four times a day (QID) | RECTAL | Status: DC | PRN

## 2019-07-25 MED ORDER — MAGNESIUM OXIDE 400 (241.3 MG) MG PO TABS
800.0000 mg | ORAL_TABLET | Freq: Every day | ORAL | Status: DC
  Administered 2019-07-26 – 2019-08-02 (×8): 800 mg via ORAL
  Filled 2019-07-25 (×8): qty 2

## 2019-07-25 NOTE — Progress Notes (Signed)
Notified by ER nurse, [place] that patient brought in today after fall in home. He is concerned about missing his chemo appointment today. Per Dr. [name]: IF he went to ER today, he will not be getting chemo. Appointments cancelled and will be rescheduled based on his disposition today. Attempted to reach nurse x 2, but was not available. Sent secure chat w/MD's response to being treated today.

## 2019-07-25 NOTE — ED Notes (Signed)
Attempted to call report again, RN will call back when available.

## 2019-07-25 NOTE — ED Notes (Signed)
Patient stated he is unable to stand up and therefore cannot compete orthostatics at this time, states he is too weak.

## 2019-07-25 NOTE — ED Triage Notes (Signed)
Patient had a mechanical fall last night around 10pm, tripped over a canister, states he is weak because the hospital would not let him walk. Reports R knee and R ankle pain. Weeping and skin tears noted to R calf and ankle area. Concerned about missing his cancer treatment this AM at 0945.

## 2019-07-25 NOTE — ED Notes (Signed)
Report given to floor

## 2019-07-25 NOTE — ED Notes (Signed)
Attempted to call report, no response.

## 2019-07-25 NOTE — Progress Notes (Signed)
   07/25/19 1807  Assess: MEWS Score  Temp 97.8 F (36.6 C)  BP (!) 90/52  Pulse Rate (!) 128  Resp 16  SpO2 100 %  Assess: MEWS Score  MEWS Temp 0  MEWS Systolic 1  MEWS Pulse 2  MEWS RR 0  MEWS LOC 0  MEWS Score 3  MEWS Score Color Yellow  Assess: if the MEWS score is Yellow or Red  Were vital signs taken at a resting state? Yes  Focused Assessment Documented focused assessment  Early Detection of Sepsis Score *See Row Information* Medium  MEWS guidelines implemented *See Row Information* Yes  Treat  MEWS Interventions Administered scheduled meds/treatments  Take Vital Signs  Increase Vital Sign Frequency  Yellow: Q 2hr X 2 then Q 4hr X 2, if remains yellow, continue Q 4hrs  Escalate  MEWS: Escalate Yellow: discuss with charge nurse/RN and consider discussing with provider and RRT  Notify: Charge Nurse/RN  Name of Charge Nurse/RN Notified Pamala Hurry, LPN  Date Charge Nurse/RN Notified 07/25/19  Time Charge Nurse/RN Notified [id number]  Notify: Provider  Provider Name/Title S. Nettey  Date Provider Notified 07/25/19  Time Provider Notified 1812  Notification Type Page  Notification Reason Change in status   Patient just arrived from ED. Patient alerted yellow MEWS score for HR 128 and BP 90/52. Discussed with LPN Pamala Hurry, and notified Dr. Lonny Prude. Administered scheduled midodrine per orders. Initiated Yellow MEWS guidelines. Assessed patient.

## 2019-07-25 NOTE — H&P (Signed)
History and Physical  Clarence Andrews. MEQ:[id number] DOB: 02-12-1950 DOA: 07/25/2019  Referring physician: Theotis Burrow, MD PCP: Patient, No Pcp Per  Outpatient Specialists: Dr. [name] (oncology) Patient coming from: Home At his baseline ambulates with a walker  Chief Complaint: Tripped over canister and found down for quite some time  HPI: Clarence Andrews. is a 69 y.o. male with medical history significant for persistent atrial fibrillation/flutter on digoxin and Lopressor, combined diastolic/systolic CHF , metastatic colon cancer.  Patient was recently hospitalized from 6/8-6/23/2021 for A. fib with RVR.  He was discharged on digoxin and metoprolol recommended by cardiology given Hospital course complicated by difficult to control rates without any anticoagulation due to high risk for bleed in the setting of colorectal cancer.  Hospital course was also complicated by orthostatic hypotension requiring midodrine, and large bowel obstruction secondary to rectal mass from colon cancer requiring colonic stent placement on 6/11 with resolution of obstruction.  Patient states he felt well after being discharged and had no new symptoms and was able to take all of his medications without any issues.  Shortly after eating dinner patient tripped over a canister in his room and scratched his right knee and leg on the door frame of his bedroom and landed on his right leg.  He denies losing any consciousness or having any head trauma.  He states he was too weak to get up on his own.  He was eventually found by his neighbor who intermittently checks up on him.  Of note patient states he has been very weak since being discharged.  He was offered SNF prior to discharge but declined at that time.  He now states he is interested in SNF rehabilitation.  ED course: Afebrile, initial heart rate of 128 but then normalized to 75, blood pressure unremarkable. Lab work notable for SARS PCR test negative, WBC 16.3  (7.1 on discharge), hemoglobin 9.2, CK 1241, CO2 21, creatinine 1.84 (previous 0.91), BUN 28.  X-ray of lumbar spine with no acute bony abnormalities, chronic degenerative changes X-ray right ankle no acute fracture or dislocation X-ray right tibia/fibula linear density adjacent to the lateral femoral condyle with overlying soft tissue swelling which could represent a tiny chip fracture. Patient received LR bolus 1 L, IV Lopressor 5 mg in the ED.   [facility] service was called for further management and evaluation  Review of Systems:As mentioned in the history of present illness.Review of systems are otherwise negative Patient seen in the ED .   Past Medical History:  Diagnosis Date  . CAD (coronary artery disease) 2017   CABG  . Cancer ([place])   . CHF (congestive heart failure) ([place])   . Colitis   . COVID-19 04/29/2019  . Diabetes ([place])   . Paronychia of second toe of right foot   . Throat cancer [facility])    Past Surgical History:  Procedure Laterality Date  . APPENDECTOMY  1970  . COLONIC STENT PLACEMENT N/A 07/12/2019   Procedure: COLONIC STENT PLACEMENT;  Surgeon: Irving Copas., MD;  Location: [facility];  Service: Gastroenterology;  Laterality: N/A;  . CORONARY ARTERY BYPASS GRAFT     5 vessel   . FLEXIBLE SIGMOIDOSCOPY N/A 07/12/2019   Procedure: FLEXIBLE SIGMOIDOSCOPY;  Surgeon: Rush Landmark Telford Nab., MD;  Location: [facility];  Service: Gastroenterology;  Laterality: N/A;  . IR IMAGING GUIDED PORT INSERTION  04/04/2019  . TOOTH EXTRACTION     No Known Allergies Social History:  reports that he has never smoked.  He has never used smokeless tobacco. He reports previous alcohol use. He reports that he does not use drugs. Family History  Problem Relation Age of Onset  . Breast cancer Mother   . Colon polyps Father   . Heart disease Father   . Colon cancer Neg Hx   . Esophageal cancer Neg Hx   . Rectal cancer Neg Hx   . Stomach cancer Neg Hx        Prior to Admission medications   Medication Sig Start Date End Date Taking? Authorizing Provider  ascorbic acid (VITAMIN C) 500 MG tablet Take 1 tablet (500 mg total) by mouth daily. 05/05/19  Yes Eugenie Filler, MD  Cholecalciferol (D3-1000 PO) Take 1,000 Units by mouth daily.    Yes [provider]  Chromium-Cinnamon [phone] MCG-MG CAPS Take 2,000 mg by mouth daily.   Yes [provider]  digoxin (LANOXIN) 0.25 MG tablet Take 1 tablet (0.25 mg total) by mouth daily. 07/25/19  Yes Samuella Cota, MD  ferrous sulfate 325 (65 FE) MG EC tablet Take 325 mg by mouth daily with breakfast.   Yes [provider]  glipiZIDE (GLUCOTROL) 10 MG tablet Take 10 mg by mouth daily before breakfast.   Yes [provider]  levalbuterol (XOPENEX HFA) 45 MCG/ACT inhaler Inhale 2 puffs into the lungs every 6 (six) hours as needed for wheezing. 05/04/19  Yes Eugenie Filler, MD  loperamide (IMODIUM) 2 MG capsule Take 1 capsule by mouth 4 (four) times daily as needed for diarrhea or loose stools.    Yes [provider]  magnesium oxide (MAG-OX) 400 MG tablet Take 800 mg by mouth daily.    Yes [provider]  metoprolol tartrate (LOPRESSOR) 100 MG tablet Take 1 tablet (100 mg total) by mouth 2 (two) times daily. 05/04/19  Yes Eugenie Filler, MD  midodrine (PROAMATINE) 5 MG tablet Take 1 tablet (5 mg total) by mouth 3 (three) times daily with meals. 07/24/19  Yes Samuella Cota, MD  Turmeric 500 MG CAPS Take 2,000 mg by mouth daily. Taking 2 - 1000 mg tabs daily   Yes [provider]  vitamin B-12 (CYANOCOBALAMIN) 500 MCG tablet Take 500 mcg by mouth daily.   Yes [provider]  lidocaine-prilocaine (EMLA) cream Apply to port site 1-2 hours prior to use Patient not taking: Reported on 06/13/2019 04/02/19   Owens Shark, NP    Physical Exam: BP (!) 90/52 (BP Location: Left Arm) Comment: [place] notified   Pulse (!) 128 Comment:  [place] notified   Temp 97.8 F (36.6 C) (Oral)   Resp 16   Ht 6\' 2"  (1.88 m)   Wt 97.9 kg   SpO2 100%   BMI 27.71 kg/m   Constitutional normal appearing  male Eyes: EOMI, anicteric, normal conjunctivae ENMT: Oropharynx with moist mucous membranes, normal dentition Neck: FROM, no thyromegaly Cardiovascular: IRR rhythm, normal rate, pitting edema greater on R then left lower calf, no appreciable JVD Respiratory: Normal respir Skin: scattered abrasions on right lower leg and ankle, with erythema. Fluid filled bullae on lateral and posterior aspects of bilateral lower legs Right leg  Right ankle  Left ankle   Left Foot    Neurologic: Grossly no focal neuro deficit. Psychiatric:flat affect.  Mental status AAOx3 atory effort on room air, clear breath sounds  Abdomen: Soft,non-tender, normal bowel sounds         Labs on Admission:  Basic Metabolic Panel: Recent Labs  Lab  07/20/19 0300 07/20/19 0300 07/21/19 0340 07/22/19 1316 07/23/19 0342 07/24/19 0354 07/25/19 1030  NA 134*   < > 132* 134* 136 138 136  K 3.4*   < > 3.8 4.2 3.8 3.8 4.4  CL 101   < > 103 100 102 105 100  CO2 27   < > 25 25 24 26  21*  GLUCOSE 180*   < > 225* 264* 123* 144* 242*  BUN 19   < > 19 17 19 16  28*  CREATININE 1.15   < > 0.99 0.90 0.82 0.91 1.84*  CALCIUM 8.1*   < > 7.9* 8.2* 8.5* 8.1* 8.8*  MG 1.4*  --  1.7 1.6* 1.8 1.5*  --   PHOS 2.4*  --  2.1* 2.3* 2.8 3.1  --    < > = values in this interval not displayed.   Liver Function Tests: Recent Labs  Lab 07/20/19 0300 07/21/19 0340 07/22/19 1316 07/23/19 0342 07/24/19 0354  AST 30 36 30 36 30  ALT 30 32 34 35 30  ALKPHOS 126 120 133* 143* 149*  BILITOT 0.5 0.3 0.7 0.6 0.6  PROT 6.0* 5.7* 6.1* 6.1* 6.0*  ALBUMIN 2.2* 2.1* 2.2* 2.2* 2.2*   No results for input(s): LIPASE, AMYLASE in the last 168 hours. No results for input(s): AMMONIA in the last 168 hours. CBC: Recent Labs  Lab 07/21/19 0340 07/22/19 1316 07/23/19 0342  07/24/19 0354 07/25/19 1030  WBC 9.3 8.6 8.5 7.1 16.3*  NEUTROABS 7.0 6.6 6.3 5.1 13.8*  HGB 7.8* 8.8* 8.0* 7.9* 9.2*  HCT 25.3* 28.0* 25.1* 25.2* 30.0*  MCV 91.0 90.9 90.6 91.0 92.3  PLT 252 303 284 293 348   Cardiac Enzymes: Recent Labs  Lab 07/25/19 1030  CKTOTAL 1,241*    BNP (last 3 results) Recent Labs    04/28/19 0134  BNP 261.4*    ProBNP (last 3 results) No results for input(s): PROBNP in the last 8760 hours.  CBG: Recent Labs  Lab 07/23/19 1659 07/23/19 2106 07/24/19 0754 07/24/19 1153 07/25/19 1630  GLUCAP 158* 145* 141* 122* 172*    Radiological Exams on Admission: DG Lumbar Spine Complete  Result Date: 07/25/2019 CLINICAL DATA:  Low back pain after fall. EXAM: LUMBAR SPINE - COMPLETE 4+ VIEW COMPARISON:  07/13/2019. FINDINGS: Diffuse multilevel degenerative change. No acute bony abnormality identified. No evidence of fracture. Rectosigmoid stent noted. IMPRESSION: 1. Diffuse multilevel degenerative change. No acute bony abnormality. 2.  Rectosigmoid stent again noted. Electronically Signed   By: Marcello Moores  Register   On: 07/25/2019 13:14   DG Tibia/Fibula Right  Result Date: 07/25/2019 CLINICAL DATA:  Fall right lower leg injury EXAM: RIGHT TIBIA AND FIBULA - 2 VIEW COMPARISON:  None. FINDINGS: Tiny ossific linear density is seen adjacent to the lateral femoral condyle which could represent a tiny chip fracture. Fragmented enthesophytes seen at the anterior tibial tubercle. Mild overlying soft tissue swelling seen around the lateral aspect of the knee. IMPRESSION: Tiny linear density seen adjacent to the lateral femoral condyle with overlying soft tissue swelling which could represent a tiny chip fracture. Please correlate with the patient's site of pain. Electronically Signed   By: Prudencio Pair M.D.   On: 07/25/2019 11:28   DG Ankle Complete Right  Result Date: 07/25/2019 CLINICAL DATA:  Patient had a mechanical fall last night around 10pm, tripped over a  canister, states he is weak because the hospital would not let him walk. Reports R knee and R ankle pain. Weeping and  skin tears noted to most of right tib/fib .*comment was truncated*fell, R lower leg injury EXAM: RIGHT ANKLE - COMPLETE 3+ VIEW COMPARISON:  None. FINDINGS: Ankle mortise intact. The talar dome is normal. No malleolar fracture. The calcaneus is normal. Enthesopathic spurring of the calcaneus. IMPRESSION: No fracture or dislocation. Electronically Signed   By: Suzy Bouchard M.D.   On: 07/25/2019 11:31    EKG: Independently reviewed. unchanged from previous tracings.  Assessment/Plan Present on Admission: . AKI (acute kidney injury) ([place]) . Atrial fibrillation with RVR ([place]) . Atrial flutter ([place]) . Cancer of left colon ([place]) . Chronic systolic heart failure ([place]) . Malignant neoplasm of colon ([place]) . Tachycardia . Orthostatic hypotension . Cellulitis of right leg . Rhabdomyolysis . Unwitnessed fall . Physical deconditioning  Active Problems:   Cancer of left colon ([facility])   Tachycardia   AKI (acute kidney injury) ([place])   Malignant neoplasm of colon ([facility])   Atrial flutter ([facility])   Atrial fibrillation with RVR ([facility])   Chronic systolic heart failure ([facility])   Orthostatic hypotension   Cellulitis of right leg   Rhabdomyolysis   Unwitnessed fall   Physical deconditioning   Fall in the setting most likely of generalized deconditioning related to recent hospitalization, likely exacerbated by orthostatic hypotension and A. fib with RVR. During recent hospitalization SNF was recommended however patient declined -Patient now interested in skilled nursing facility -PT/OT to reevaluate  AKI, suspect prerenal.  Likely related to being down for unknown period of time after fall.  Not on any nephrotoxic agents.  Creatinine of 1.8 on admission, baseline 0.91 with a BUN of 28, sterile pyuria on UA -Status post IV fluid bolus in ED -Continue gentle hydration 50 cc/h x 10 hours  given CHF -Repeat BMP in a.m. -Avoid nephrotoxins, monitor output  Rhabdomyolysis in the setting of unwitnessed fall.   Patient was down for unknown period of time. -Repeat CK in a.m. -Continue gentle hydration as mentioned above  Atrial fibrillation/flutter, persistent, intermittent RVR. Initially on my evaluation in ED his heart rate was well controlled, but then noted to be in the 120s once received by the floor with SBP in 90s -Resume home digoxin -Continue Lopressor with blood pressure parameters in place -If heart rate not controlled on resumption of home medication may warrant reconsulting cardiology given patient had quite difficult time controlling heart rate on most recent admission -Monitor on telemetry  Orthostatic hypotension, possibly symptomatic Unclear if this contributed to patient's fall as he reports he tripped on a canister. -Repeat orthostatic vitals -Continue midodrine 5 mg 3 times daily.  Right lower leg nonpurulent cellulitis Apparent right lower leg abrasions with diffuse erythema and tenderness as well as scattered bullae.  Has remained afebrile has slight leukocytosis but suspect related to stress of recent fall. -IV cefazolin, monitor response -Hopeful to transition to oral antibiotics in next 24 to 40 hours -Blood cultures if becomes febrile  COPD, stable No wheezing -Continue home Xopenex  Metastatic colon cancer complicated by large bowel obstruction related to rectal mass, status post colonic stent on 6/11, stable No abdominal pain, reports had bowel movement prior to admission -Followed by Dr. [name] as outpatient, was unable to obtain chemotherapy today prior to admission -Monitor BMs -Bowel regimen  Anemia of chronic disease (chemotherapy, colon cancer) Hemoglobin stable at baseline Daily CBC  Chronic combined CHF, stable.  History of systolic CHF, TTE on most recent admission showed preserved EF. No shortness of breath, no pulmonary  edema, has some scant  edema on right leg near setting of abrasions otherwise no obvious signs for volume overload -Continue home beta-blockade -Daily weights -Fluid restrict  Type 2 diabetes, A1c 7.8. -Hold home glipizide -Monitor CBG, sliding scale as needed  CAD status post CABG, stable No chest pain-was previously on aspirin but has been discontinued in setting of previous rectal bleed DVT prophylaxis: Heparin  Code Status: Full code  Family Communication: No family at bedside  Disposition Plan: Expect needing greater than 2 midnight stay given AKI, rhabdomyolysis, concern for symptomatic orthostatic hypotension, significant weakness related to prolonged recent hospital stay, IV antibiotics for cellulitis, persistent RVR  Consults called: None  Admission status: Admitted as inpatient, tree unit.      Desiree Hane MD [facility]  Pager [phone]  If 7PM-7AM, please contact night-coverage [email or web address] Password [facility]  07/25/2019, 7:51 PM
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
## 2019-07-26 ENCOUNTER — Inpatient Hospital Stay (HOSPITAL_COMMUNITY): Payer: Medicare HMO

## 2019-07-26 DIAGNOSIS — L8990 Pressure ulcer of unspecified site, unspecified stage: Secondary | ICD-10-CM | POA: Insufficient documentation

## 2019-07-26 DIAGNOSIS — M6282 Rhabdomyolysis: Secondary | ICD-10-CM

## 2019-07-26 DIAGNOSIS — C187 Malignant neoplasm of sigmoid colon: Secondary | ICD-10-CM

## 2019-07-26 DIAGNOSIS — I4891 Unspecified atrial fibrillation: Secondary | ICD-10-CM

## 2019-07-26 DIAGNOSIS — N179 Acute kidney failure, unspecified: Secondary | ICD-10-CM

## 2019-07-26 DIAGNOSIS — M7989 Other specified soft tissue disorders: Secondary | ICD-10-CM

## 2019-07-26 LAB — BASIC METABOLIC PANEL
Anion gap: 7 (ref 5–15)
BUN: 28 mg/dL — ABNORMAL HIGH (ref 8–23)
CO2: 25 mmol/L (ref 22–32)
Calcium: 8.4 mg/dL — ABNORMAL LOW (ref 8.9–10.3)
Chloride: 105 mmol/L (ref 98–111)
Creatinine, Ser: 1.26 mg/dL — ABNORMAL HIGH (ref 0.61–1.24)
GFR calc Af Amer: >60 mL/min (ref >60)
GFR calc non Af Amer: 57 mL/min — ABNORMAL LOW (ref >60)
Glucose, Bld: 262 mg/dL — ABNORMAL HIGH (ref 70–99)
Potassium: 4.1 mmol/L (ref 3.5–5.1)
Sodium: 137 mmol/L (ref 135–145)

## 2019-07-26 LAB — CBC
HCT: 26.6 % — ABNORMAL LOW (ref 39.0–52.0)
Hemoglobin: 8.3 g/dL — ABNORMAL LOW (ref 13.0–17.0)
MCH: 28.6 pg (ref 26.0–34.0)
MCHC: 31.2 g/dL (ref 30.0–36.0)
MCV: 91.7 fL (ref 80.0–100.0)
Platelets: 265 10*3/uL (ref 150–400)
RBC: 2.9 MIL/uL — ABNORMAL LOW (ref 4.22–5.81)
RDW: 17.2 % — ABNORMAL HIGH (ref 11.5–15.5)
WBC: 8 10*3/uL (ref 4.0–10.5)
nRBC: 0 % (ref 0.0–0.2)

## 2019-07-26 LAB — CK: Total CK: 528 U/L — ABNORMAL HIGH (ref 49–397)

## 2019-07-26 LAB — GLUCOSE, CAPILLARY
Glucose-Capillary: 285 mg/dL — ABNORMAL HIGH (ref 70–99)
Glucose-Capillary: 247 mg/dL — ABNORMAL HIGH (ref 70–99)
Glucose-Capillary: 142 mg/dL — ABNORMAL HIGH (ref 70–99)
Glucose-Capillary: 153 mg/dL — ABNORMAL HIGH (ref 70–99)

## 2019-07-26 MED ORDER — POLYETHYLENE GLYCOL 3350 17 G PO PACK: 17.0000 g | PACK | Freq: Every day | ORAL | Status: DC | PRN

## 2019-07-26 MED ORDER — SODIUM CHLORIDE 0.9% FLUSH
10.0000 mL | Freq: Two times a day (BID) | INTRAVENOUS | Status: DC
  Administered 2019-07-28 – 2019-08-02 (×5): 10 mL

## 2019-07-26 MED ORDER — HYDROCODONE-ACETAMINOPHEN 5-325 MG PO TABS
1.0000 | ORAL_TABLET | Freq: Four times a day (QID) | ORAL | Status: DC | PRN
  Administered 2019-07-26 – 2019-07-29 (×5): 1 via ORAL
  Filled 2019-07-26 (×5): qty 1

## 2019-07-26 MED ORDER — INSULIN GLARGINE 100 UNIT/ML ~~LOC~~ SOLN
10.0000 [IU] | Freq: Every day | SUBCUTANEOUS | Status: DC
  Administered 2019-07-26 – 2019-08-02 (×8): 10 [IU] via SUBCUTANEOUS
  Filled 2019-07-26 (×8): qty 0.1

## 2019-07-26 MED ORDER — SODIUM CHLORIDE 0.9% FLUSH: 10.0000 mL | INTRAVENOUS | Status: DC | PRN

## 2019-07-26 MED ORDER — SODIUM CHLORIDE 0.9 % IV SOLN: INTRAVENOUS | Status: AC

## 2019-07-26 MED ORDER — CEFAZOLIN SODIUM-DEXTROSE 1-4 GM/50ML-% IV SOLN: 1.0000 g | Freq: Three times a day (TID) | INTRAVENOUS | Status: DC

## 2019-07-26 NOTE — Progress Notes (Addendum)
PROGRESS NOTE    Clarence Andrews.  XTK:[id number] DOB: Sep 25, 1950 DOA: 07/25/2019 PCP: Patient, No Pcp Per   Brief Narrative:   69 y.o. male with medical history significant for persistent atrial fibrillation/flutter on digoxin and Lopressor, combined diastolic/systolic CHF , metastatic colon cancer.  Patient was recently hospitalized from 6/8-6/23/2021 for A. fib with RVR.  Went home despite of being offered SNF.  At home he tripped and fell due to weakness, found by the neighbor therefore brought to the hospital.  Work-up was negative for trauma but showed UTI and mild rhabdomyolysis.   Assessment & Plan:   Active Problems:   Cancer of left colon (HCC)   Tachycardia   AKI (acute kidney injury) ([place])   Malignant neoplasm of colon (HCC)   Atrial flutter (HCC)   Atrial fibrillation with RVR (HCC)   Chronic systolic heart failure (HCC)   Orthostatic hypotension   Cellulitis of right leg   Rhabdomyolysis   Unwitnessed fall   Physical deconditioning   Pressure injury of skin   Mechanical fall, generalized weakness Denies any red flags or systemic symptoms.  Previously refused SNF but this time is agreeable PT/OT ordered.  AKI, suspect prerenal.   Baseline creatinine 0.9.  Admission creatinine 1.8.  Secondary to dehydration.  Improved with IV fluids.  Today is 1.26 Continue to monitor urine output.  Rhabdomyolysis in the setting of unwitnessed fall.   CK levels have trended down with IV fluids.  Atrial fibrillation/flutter, persistent, intermittent RVR. Currently in A. fib, rate controlled.  Poor candidate for anticoagulation Continue digoxin p.o. daily, Lopressor 100 mg p.o. twice daily  Orthostatic hypotension, possibly symptomatic Suspect some autonomic dysfunction with a component of dehydration getting IV fluids. Continue midodrine 5 mg 3 times daily  Urinary tract infection with hematuria -Already in antibiotics, will order cultures.  Continue cefazolin for now  and monitor him clinically  Right lower leg nonpurulent cellulitis Erythema and tenderness of the right lower extremity. Continue IV cefazolin, cellulitis order set used Right lower extremity Dopplers-ordered  COPD, stable No wheezing -Continue home Xopenex  Metastatic colon cancer complicated by large bowel obstruction related to rectal mass, status post colonic stent on 6/11, stable No abdominal pain, reports had bowel movement prior to admission -Followed by Dr. [name] as outpatient, seen by him during this hospitalization.  Appreciate his input.  Pain medication started Bowel regimen added.  Anemia of chronic disease (chemotherapy, colon cancer) Hemoglobin stable at baseline Daily CBC  Chronic combined CHF, stable.  History of systolic CHF, TTE on most recent admission showed preserved EF. Overall appears to be euvolemic in nature.  Resume his home medication including beta-blocker.  Discontinue fluid restriction as he needs hydration for his AKI.  Closely monitor for signs of volume overload.  Type 2 diabetes, A1c 7.8.  Uncontrolled -Hold home glipizide.  Add Lantus 10 units daily Insulin sliding scale and Accu-Chek  CAD status post CABG, stable Currently without any chest pain.    DVT prophylaxis: Subcu heparin Code Status: Full code Family Communication: None  Status is: Inpatient  Remains inpatient appropriate because:IV treatments appropriate due to intensity of illness or inability to take PO   Dispo: The patient is from: Home              Anticipated d/c is to: SNF              Anticipated d/c date is: 2 days  Patient currently is not medically stable to d/c.  Patient continues to be on IV antibiotic and IV fluids.  Once culture data is available he will be transitioned to p.o. antibiotic.  In the meantime he needs IV fluids to help with his rhabdomyolysis  Body mass index is 27.71 kg/m.  Pressure Injury 07/25/19 Sacrum Stage 1 -   Intact skin with non-blanchable redness of a localized area usually over a bony prominence. (Active)  07/25/19 1800  Location: Sacrum  Location Orientation:   Staging: Stage 1 -  Intact skin with non-blanchable redness of a localized area usually over a bony prominence.  Wound Description (Comments):   Present on Admission: Yes    Subjective: Feels a little bit better after getting IV fluids overnight.  Still feels weak overall.  This time he is agreeable to go to SNF.  Review of Systems Otherwise negative except as per HPI, including: General: Denies fever, chills, night sweats or unintended weight loss. Resp: Denies cough, wheezing, shortness of breath. Cardiac: Denies chest pain, palpitations, orthopnea, paroxysmal nocturnal dyspnea. GI: Denies abdominal pain, nausea, vomiting, diarrhea or constipation GU: Denies dysuria, frequency, hesitancy or incontinence MS: Denies muscle aches, joint pain or swelling Neuro: Denies headache, neurologic deficits (focal weakness, numbness, tingling), abnormal gait Psych: Denies anxiety, depression, SI/HI/AVH Skin: Denies new rashes or lesions ID: Denies sick contacts, exotic exposures, travel  Examination:  Constitutional: Not in acute distress, dry mouth Respiratory: Clear to auscultation bilaterally Cardiovascular: Normal sinus rhythm, no rubs Abdomen: Nontender nondistended good bowel sounds Musculoskeletal: No edema noted Skin: Right lower extremity erythema noted up to his knees.  Slightly warm to touch. Neurologic: CN 2-12 grossly intact.  And nonfocal Psychiatric: Normal judgment and insight. Alert and oriented x 3. Normal mood.   Objective: Vitals:   07/25/19 2328 07/25/19 2335 07/26/19 0355 07/26/19 0816  BP:  115/74 120/60 134/61  Pulse: (!) 110 (!) 119 65 63  Resp:  (!) 22 16 (!) 21  Temp:  98 F (36.7 C) 97.9 F (36.6 C) 97.7 F (36.5 C)  TempSrc:  Oral Oral Oral  SpO2:  100% 98% 100%  Weight:      Height:         Intake/Output Summary (Last 24 hours) at 07/26/2019 1203 Last data filed at 07/26/2019 1113 Gross per 24 hour  Intake 1552 ml  Output 275 ml  Net 1277 ml   Filed Weights   07/25/19 1006  Weight: 97.9 kg     Data Reviewed:   CBC: Recent Labs  Lab 07/21/19 0340 07/21/19 0340 07/22/19 1316 07/23/19 0342 07/24/19 0354 07/25/19 1030 07/26/19 0322  WBC 9.3   < > 8.6 8.5 7.1 16.3* 8.0  NEUTROABS 7.0  --  6.6 6.3 5.1 13.8*  --   HGB 7.8*   < > 8.8* 8.0* 7.9* 9.2* 8.3*  HCT 25.3*   < > 28.0* 25.1* 25.2* 30.0* 26.6*  MCV 91.0   < > 90.9 90.6 91.0 92.3 91.7  PLT 252   < > 303 284 293 348 265   < > = values in this interval not displayed.   Basic Metabolic Panel: Recent Labs  Lab 07/20/19 0300 07/20/19 0300 07/21/19 0340 07/21/19 0340 07/22/19 1316 07/23/19 0342 07/24/19 0354 07/25/19 1030 07/26/19 0322  NA 134*   < > 132*   < > 134* 136 138 136 137  K 3.4*   < > 3.8   < > 4.2 3.8 3.8 4.4 4.1  CL 101   < >  103   < > 100 102 105 100 105  CO2 27   < > 25   < > 25 24 26  21* 25  GLUCOSE 180*   < > 225*   < > 264* 123* 144* 242* 262*  BUN 19   < > 19   < > 17 19 16  28* 28*  CREATININE 1.15   < > 0.99   < > 0.90 0.82 0.91 1.84* 1.26*  CALCIUM 8.1*   < > 7.9*   < > 8.2* 8.5* 8.1* 8.8* 8.4*  MG 1.4*  --  1.7  --  1.6* 1.8 1.5*  --   --   PHOS 2.4*  --  2.1*  --  2.3* 2.8 3.1  --   --    < > = values in this interval not displayed.   GFR: Estimated Creatinine Clearance: 62.5 mL/min (A) (by C-G formula based on SCr of 1.26 mg/dL (H)). Liver Function Tests: Recent Labs  Lab 07/20/19 0300 07/21/19 0340 07/22/19 1316 07/23/19 0342 07/24/19 0354  AST 30 36 30 36 30  ALT 30 32 34 35 30  ALKPHOS 126 120 133* 143* 149*  BILITOT 0.5 0.3 0.7 0.6 0.6  PROT 6.0* 5.7* 6.1* 6.1* 6.0*  ALBUMIN 2.2* 2.1* 2.2* 2.2* 2.2*   No results for input(s): LIPASE, AMYLASE in the last 168 hours. No results for input(s): AMMONIA in the last 168 hours. Coagulation Profile: No results  for input(s): INR, PROTIME in the last 168 hours. Cardiac Enzymes: Recent Labs  Lab 07/25/19 1030 07/26/19 0322  CKTOTAL 1,241* 528*   BNP (last 3 results) No results for input(s): PROBNP in the last 8760 hours. HbA1C: No results for input(s): HGBA1C in the last 72 hours. CBG: Recent Labs  Lab 07/24/19 1153 07/25/19 1630 07/25/19 2145 07/26/19 0742 07/26/19 1131  GLUCAP 122* 172* 206* 285* 247*   Lipid Profile: No results for input(s): CHOL, HDL, LDLCALC, TRIG, CHOLHDL, LDLDIRECT in the last 72 hours. Thyroid Function Tests: No results for input(s): TSH, T4TOTAL, FREET4, T3FREE, THYROIDAB in the last 72 hours. Anemia Panel: No results for input(s): VITAMINB12, FOLATE, FERRITIN, TIBC, IRON, RETICCTPCT in the last 72 hours. Sepsis Labs: No results for input(s): PROCALCITON, LATICACIDVEN in the last 168 hours.  Recent Results (from the past 240 hour(s))  C Difficile Quick Screen (NO PCR Reflex)     Status: None   Collection Time: 07/23/19  4:06 PM   Specimen: STOOL  Result Value Ref Range Status   C Diff antigen NEGATIVE NEGATIVE Final   C Diff toxin NEGATIVE NEGATIVE Final   C Diff interpretation No C. difficile detected.  Final    Comment: Performed at [facility], [place] [address] [place], [place] [address]  SARS Coronavirus 2 by RT PCR (hospital order, performed in [facility] hospital lab) Nasopharyngeal Nasopharyngeal Swab     Status: None   Collection Time: 07/25/19  2:29 PM   Specimen: Nasopharyngeal Swab  Result Value Ref Range Status   SARS Coronavirus 2 NEGATIVE NEGATIVE Final    Comment: (NOTE) SARS-CoV-2 target nucleic acids are NOT DETECTED.  The SARS-CoV-2 RNA is generally detectable in upper and lower respiratory specimens during the acute phase of infection. The lowest concentration of SARS-CoV-2 viral copies this assay can detect is 250 copies / mL. A negative result does not preclude SARS-CoV-2 infection and should not be used as  the sole basis for treatment or other patient management decisions.  A negative result may  occur with improper specimen collection / handling, submission of specimen other than nasopharyngeal swab, presence of viral mutation(s) within the areas targeted by this assay, and inadequate number of viral copies (<250 copies / mL). A negative result must be combined with clinical observations, patient history, and epidemiological information.  Fact Sheet for Patients:   StrictlyIdeas.no  Fact Sheet for Healthcare Providers: BankingDealers.co.za  This test is not yet approved or  cleared by the [place] FDA and has been authorized for detection and/or diagnosis of SARS-CoV-2 by FDA under an Emergency Use Authorization (EUA).  This EUA will remain in effect (meaning this test can be used) for the duration of the COVID-19 declaration under Section 564(b)(1) of the Act, 21 U.S.C. section 360bbb-3(b)(1), unless the authorization is terminated or revoked sooner.  Performed at [facility], [place] [address], [place], [place] [address]          Radiology Studies: DG Lumbar Spine Complete  Result Date: 07/25/2019 CLINICAL DATA:  Low back pain after fall. EXAM: LUMBAR SPINE - COMPLETE 4+ VIEW COMPARISON:  07/13/2019. FINDINGS: Diffuse multilevel degenerative change. No acute bony abnormality identified. No evidence of fracture. Rectosigmoid stent noted. IMPRESSION: 1. Diffuse multilevel degenerative change. No acute bony abnormality. 2.  Rectosigmoid stent again noted. Electronically Signed   By: Marcello Moores  Register   On: 07/25/2019 13:14   DG Tibia/Fibula Right  Result Date: 07/25/2019 CLINICAL DATA:  Fall right lower leg injury EXAM: RIGHT TIBIA AND FIBULA - 2 VIEW COMPARISON:  None. FINDINGS: Tiny ossific linear density is seen adjacent to the lateral femoral condyle which could represent a tiny chip fracture. Fragmented  enthesophytes seen at the anterior tibial tubercle. Mild overlying soft tissue swelling seen around the lateral aspect of the knee. IMPRESSION: Tiny linear density seen adjacent to the lateral femoral condyle with overlying soft tissue swelling which could represent a tiny chip fracture. Please correlate with the patient's site of pain. Electronically Signed   By: Prudencio Pair M.D.   On: 07/25/2019 11:28   DG Ankle Complete Right  Result Date: 07/25/2019 CLINICAL DATA:  Patient had a mechanical fall last night around 10pm, tripped over a canister, states he is weak because the hospital would not let him walk. Reports R knee and R ankle pain. Weeping and skin tears noted to most of right tib/fib .*comment was truncated*fell, R lower leg injury EXAM: RIGHT ANKLE - COMPLETE 3+ VIEW COMPARISON:  None. FINDINGS: Ankle mortise intact. The talar dome is normal. No malleolar fracture. The calcaneus is normal. Enthesopathic spurring of the calcaneus. IMPRESSION: No fracture or dislocation. Electronically Signed   By: Suzy Bouchard M.D.   On: 07/25/2019 11:31        Scheduled Meds: . Chlorhexidine Gluconate Cloth  6 each Topical Daily  . digoxin  0.25 mg Oral Daily  . ferrous sulfate  325 mg Oral Q breakfast  . heparin  5,000 Units Subcutaneous Q8H  . insulin aspart  0-9 Units Subcutaneous TID WC  . magnesium oxide  800 mg Oral Daily  . metoprolol tartrate  100 mg Oral BID  . midodrine  5 mg Oral TID WC  . senna  1 tablet Oral BID  . sodium chloride flush  10-40 mL Intracatheter Q12H   Continuous Infusions: .  ceFAZolin (ANCEF) IV 1 g (07/26/19 0701)     LOS: 1 day   Time spent= 35 mins    Laurenashley Viar Arsenio Loader, MD [facility]  If 7PM-7AM, please contact night-coverage  07/26/2019, 12:03 PM

## 2019-07-26 NOTE — Progress Notes (Signed)
Right lower extremity venous duplex has been completed. Preliminary results can be found in CV Proc through chart review.   07/26/19 1:25 PM Carlos Levering RVT

## 2019-07-26 NOTE — Progress Notes (Signed)
Inpatient Diabetes Program Recommendations  AACE/ADA: New Consensus Statement on Inpatient Glycemic Control (2015)  Target Ranges:  Prepandial:   less than 140 mg/dL      Peak postprandial:   less than 180 mg/dL (1-2 hours)      Critically ill patients:  140 - 180 mg/dL   Lab Results  Component Value Date   GLUCAP 285 (H) 07/26/2019   HGBA1C 7.8 (H) 07/09/2019   Results for NAFIS, FARNAN (MRN [id number]) as of 07/26/2019 11:14  Ref. Range 07/25/2019 16:30 07/25/2019 21:45 07/26/2019 07:42  Glucose-Capillary Latest Ref Range: 70 - 99 mg/dL 172 (H) 206 (H) 285 (H)   Review of Glycemic Control Fall Diabetes history: DM 2 Outpatient Diabetes medications: Glipizide 10 mg Daily Current orders for Inpatient glycemic control:  Novolog 0-9 units tid  A1c 7.8% on 6/8  Inpatient Diabetes Program Recommendations:    - Consider adding Novolog hs scale  - Consider Levemir 6-8 units if glucose remains elevated  Thanks,  Tama Headings RN, MSN, BC-ADM Inpatient Diabetes Coordinator Team Pager [phone] (8a-5p)

## 2019-07-26 NOTE — Consult Note (Signed)
   [facility] CM Inpatient Consult   07/26/2019  Clarence Andrews. 1950-12-10 [id number]   Patient chart has been reviewed for readmissions less than 7 days and for high risk score,33%, for unplanned readmissions.  Patient assessed for community [facility] follow up needs.  Chart review reveals current disposition plan is for SNF. Will continue to follow for progression and disposition.  Netta Cedars, MSN, [facility] Liaison Nurse Mobile Phone [phone]  Toll free office [phone]

## 2019-07-26 NOTE — Evaluation (Signed)
Physical Therapy Evaluation Patient Details Name: Clarence Andrews. MRN: [id number] DOB: 10/18/1950 Today's Date: 07/26/2019   History of Present Illness  69 y.o. male with medical history significant for persistent atrial fibrillation/flutter on digoxin and Lopressor, combined diastolic/systolic CHF , metastatic colon cancer.  Patient was recently hospitalized from 6/8-6/23/2021 for A. fib with RVR, large bowel obstruction 2* colonic mass, orthostatic hypotension. Pt was offered SNF but declined. At home he fell, now admitted with rhabdomyolysis, AKI, cellulitis RLE.  Clinical Impression  Pt admitted with above diagnosis. Pt reports 8/10 in BLEs, he had norco 1 hour prior to PT session. He stated he's hurting to much to attempt supine to sit. Assessed BLE ROM and assisted pt to scoot up in bed with +2 total assist. SNF recommended. Nurse tech reported pt requires max assist to roll. Pt currently with functional limitations due to the deficits listed below (see PT Problem List). Pt will benefit from skilled PT to increase their independence and safety with mobility to allow discharge to the venue listed below.       Follow Up Recommendations SNF    Equipment Recommendations  None recommended by PT    Recommendations for Other Services       Precautions / Restrictions Precautions Precautions: Fall Precaution Comments: fell at home just prior to this admission Restrictions Weight Bearing Restrictions: No      Mobility  Bed Mobility Overal bed mobility: Needs Assistance             General bed mobility comments: +2 total assist for scooting up in bed, pt reported he's in too much pain to attempt rolling or supine to sit  Transfers                 General transfer comment: NT- 2* pain  Ambulation/Gait         Gait velocity: NT-2* pain      Stairs            Wheelchair Mobility    Modified Rankin (Stroke Patients Only)       Balance                                              Pertinent Vitals/Pain Pain Assessment: 0-10 Pain Score: 8  Pain Location: both legs "all over" Pain Descriptors / Indicators: Sore Pain Intervention(s): Limited activity within patient's tolerance;Monitored during session;Premedicated before session;Repositioned    Home Living Family/patient expects to be discharged to:: Private residence Living Arrangements: Alone Available Help at Discharge: Friend(s);Available PRN/intermittently Type of Home: Mobile home Home Access: Stairs to enter   Entrance Stairs-Number of Steps: 4 Home Layout: One level Home Equipment: Walker - 2 wheels      Prior Function Level of Independence: Independent               Hand Dominance        Extremity/Trunk Assessment   Upper Extremity Assessment Upper Extremity Assessment: Overall WFL for tasks assessed    Lower Extremity Assessment Lower Extremity Assessment: RLE deficits/detail;LLE deficits/detail RLE Deficits / Details: pain with attempted ankle DF, tolerated knee flexion to ~45* limited by pain RLE: Unable to fully assess due to pain RLE Sensation: WNL LLE Deficits / Details: , tolerated knee flexion AAROM to ~45* LLE: Unable to fully assess due to pain LLE Sensation: WNL    Cervical / Trunk Assessment  Cervical / Trunk Assessment:  (unable to assess, pt unable to move into sitting position)  Communication   Communication: No difficulties  Cognition Arousal/Alertness: Awake/alert Behavior During Therapy: WFL for tasks assessed/performed Overall Cognitive Status: Within Functional Limits for tasks assessed                                 General Comments: pt stated he now realizes he can't manage at home alone and is agreeable to ST-SNF      General Comments      Exercises General Exercises - Lower Extremity Ankle Circles/Pumps: AROM;Left;5 reps;Supine (unable to tolerate on R 2* pain) Heel Slides:  AAROM;Both;10 reps;Supine   Assessment/Plan    PT Assessment Patient needs continued PT services  PT Problem List Decreased balance;Decreased knowledge of use of DME;Decreased activity tolerance;Decreased strength;Decreased mobility       PT Treatment Interventions Gait training;DME instruction;Functional mobility training;Therapeutic exercise;Therapeutic activities;Balance training;Patient/family education    PT Goals (Current goals can be found in the Care Plan section)  Acute Rehab PT Goals Patient Stated Goal: to get stronger at SNF PT Goal Formulation: With patient Potential to Achieve Goals: Fair    Frequency Min 2X/week   Barriers to discharge        Co-evaluation               AM-PAC PT "6 Clicks" Mobility  Outcome Measure Help needed turning from your back to your side while in a flat bed without using bedrails?: Total Help needed moving from lying on your back to sitting on the side of a flat bed without using bedrails?: Total Help needed moving to and from a bed to a chair (including a wheelchair)?: Total Help needed standing up from a chair using your arms (e.g., wheelchair or bedside chair)?: Total Help needed to walk in hospital room?: Total Help needed climbing 3-5 steps with a railing? : Total 6 Click Score: 6    End of Session Equipment Utilized During Treatment: Gait belt Activity Tolerance: Patient limited by pain Patient left: in bed;with bed alarm set;with call bell/phone within reach Nurse Communication: Mobility status PT Visit Diagnosis: Other abnormalities of gait and mobility (R26.89);Muscle weakness (generalized) (M62.81)    Time: 0370-4888 PT Time Calculation (min) (ACUTE ONLY): 35 min   Charges:   PT Evaluation $PT Eval Moderate Complexity: 1 Mod PT Treatments $Therapeutic Activity: 8-22 mins      Blondell Reveal Kistler PT 07/26/2019  Acute Rehabilitation Services Pager [phone] Office [phone]
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
## 2019-07-26 NOTE — Progress Notes (Addendum)
HEMATOLOGY-ONCOLOGY PROGRESS NOTE  SUBJECTIVE: [name] was discharged on 07/24/2019.  He was readmitted yesterday after a fall at home.  He reports tripping when leaving his bathroom.  He presented to the emergency room and was readmitted.  He complains of pain in the right knee and left hip areas.  He denies nausea and reports having bowel movements.  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[place])  04/02/2019 Initial Diagnosis   Cancer of left colon ([place])   04/11/2019 -  Chemotherapy   The patient had palonosetron (ALOXI) injection 0.25 mg, 0.25 mg, Intravenous,  Once, 5 of 6 cycles Administration: 0.25 mg (04/11/2019), 0.25 mg (05/28/2019), 0.25 mg (06/11/2019), 0.25 mg (06/25/2019) pegfilgrastim-cbqv (UDENYCA) injection 6 mg, 6 mg, Subcutaneous, Once, 4 of 5 cycles Administration: 6 mg (05/30/2019), 6 mg (06/13/2019), 6 mg (06/27/2019) leucovorin 972 mg in dextrose 5 % 250 mL infusion, 400 mg/m2 = 972 mg, Intravenous,  Once, 5 of 6 cycles Administration: 972 mg (04/11/2019), 972 mg (05/28/2019), 972 mg (06/11/2019), 972 mg (06/25/2019) oxaliplatin (ELOXATIN) 200 mg in dextrose 5 % 500 mL chemo infusion, 83 mg/m2 = 205 mg, Intravenous,  Once, 5 of 6 cycles Administration: 200 mg (04/11/2019), 200 mg (05/28/2019), 200 mg (06/11/2019), 200 mg (06/25/2019) fluorouracil (ADRUCIL) chemo injection 950 mg, 400 mg/m2 = 950 mg, Intravenous,  Once, 1 of 1 cycle Administration: 950 mg (04/11/2019) fluorouracil (ADRUCIL) 5,850 mg in sodium chloride 0.9 % 133 mL chemo infusion, 2,400 mg/m2 = 5,850 mg, Intravenous, 1 Day/Dose, 5 of 6 cycles Dose modification: 2,000 mg/m2 (original dose 2,400 mg/m2, Cycle 2, Reason: Provider Judgment) Administration: 5,850 mg (04/11/2019), 4,850 mg (05/28/2019), 4,850 mg (06/11/2019), 4,850 mg (06/25/2019)  for chemotherapy treatment.    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[place])  04/02/2019 Initial Diagnosis   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[place])   04/11/2019 -  Chemotherapy   The  patient had palonosetron (ALOXI) injection 0.25 mg, 0.25 mg, Intravenous,  Once, 5 of 6 cycles Administration: 0.25 mg (04/11/2019), 0.25 mg (05/28/2019), 0.25 mg (06/11/2019), 0.25 mg (06/25/2019) pegfilgrastim-cbqv (UDENYCA) injection 6 mg, 6 mg, Subcutaneous, Once, 4 of 5 cycles Administration: 6 mg (05/30/2019), 6 mg (06/13/2019), 6 mg (06/27/2019) leucovorin 972 mg in dextrose 5 % 250 mL infusion, 400 mg/m2 = 972 mg, Intravenous,  Once, 5 of 6 cycles Administration: 972 mg (04/11/2019), 972 mg (05/28/2019), 972 mg (06/11/2019), 972 mg (06/25/2019) oxaliplatin (ELOXATIN) 200 mg in dextrose 5 % 500 mL chemo infusion, 83 mg/m2 = 205 mg, Intravenous,  Once, 5 of 6 cycles Administration: 200 mg (04/11/2019), 200 mg (05/28/2019), 200 mg (06/11/2019), 200 mg (06/25/2019) fluorouracil (ADRUCIL) chemo injection 950 mg, 400 mg/m2 = 950 mg, Intravenous,  Once, 1 of 1 cycle Administration: 950 mg (04/11/2019) fluorouracil (ADRUCIL) 5,850 mg in sodium chloride 0.9 % 133 mL chemo infusion, 2,400 mg/m2 = 5,850 mg, Intravenous, 1 Day/Dose, 5 of 6 cycles Dose modification: 2,000 mg/m2 (original dose 2,400 mg/m2, Cycle 2, Reason: Provider Judgment) Administration: 5,850 mg (04/11/2019), 4,850 mg (05/28/2019), 4,850 mg (06/11/2019), 4,850 mg (06/25/2019)  for chemotherapy treatment.      PHYSICAL EXAMINATION:  Vitals:   07/25/19 2335 07/26/19 0355  BP: 115/74 120/60  Pulse: (!) 119 65  Resp: (!) 22 16  Temp: 98 F (36.7 C) 97.9 F (36.6 C)  SpO2: 100% 98%   Filed Weights   07/25/19 1006  Weight: 215 lb 13.3 oz (97.9 kg)    Intake/Output from previous day: 06/24 0701 - 06/25 0700 In: 1316 [P.O.:236; IV Piggyback:1080] Out:  275 [Urine:275]  GENERAL:alert, no distress and comfortable HEENT: No thrush or ulcers LUNGS: Clear anteriorly HEART: Regular rhythm ABDOMEN:abdomen soft, non-tender, no hepatomegaly NEURO: alert & oriented x 3 with fluent speech, limited movement of the legs-pain related? Skin:  Abrasions at the right lower leg, blisters at the lower leg bilaterally  LABORATORY DATA:  I have reviewed the data as listed CMP Latest Ref Rng & Units 07/26/2019 07/25/2019 07/24/2019  Glucose 70 - 99 mg/dL 262(H) 242(H) 144(H)  BUN 8 - 23 mg/dL 28(H) 28(H) 16  Creatinine 0.61 - 1.24 mg/dL 1.26(H) 1.84(H) 0.91  Sodium 135 - 145 mmol/L 137 136 138  Potassium 3.5 - 5.1 mmol/L 4.1 4.4 3.8  Chloride 98 - 111 mmol/L 105 100 105  CO2 22 - 32 mmol/L 25 21(L) 26  Calcium 8.9 - 10.3 mg/dL 8.4(L) 8.8(L) 8.1(L)  Total Protein 6.5 - 8.1 g/dL - - 6.0(L)  Total Bilirubin 0.3 - 1.2 mg/dL - - 0.6  Alkaline Phos 38 - 126 U/L - - 149(H)  AST 15 - 41 U/L - - 30  ALT 0 - 44 U/L - - 30    Lab Results  Component Value Date   WBC 8.0 07/26/2019   HGB 8.3 (L) 07/26/2019   HCT 26.6 (L) 07/26/2019   MCV 91.7 07/26/2019   PLT 265 07/26/2019   NEUTROABS 13.8 (H) 07/25/2019    DG Chest 1 View  Result Date: 07/09/2019 CLINICAL DATA:  Tachycardia and diffuse abdominal pain. EXAM: CHEST  1 VIEW COMPARISON:  04/28/2019 FINDINGS: Previous median sternotomy. Power port inserted from a right jugular approach with tip at the SVC RA junction. Heart size is upper limits of normal with some left ventricular prominence. No acute mediastinal finding. The pulmonary vascularity is normal. The lungs are clear. No infiltrate, collapse or effusion. IMPRESSION: No active disease. Electronically Signed   By: Nelson Chimes M.D.   On: 07/09/2019 14:21   DG Lumbar Spine Complete  Result Date: 07/25/2019 CLINICAL DATA:  Low back pain after fall. EXAM: LUMBAR SPINE - COMPLETE 4+ VIEW COMPARISON:  07/13/2019. FINDINGS: Diffuse multilevel degenerative change. No acute bony abnormality identified. No evidence of fracture. Rectosigmoid stent noted. IMPRESSION: 1. Diffuse multilevel degenerative change. No acute bony abnormality. 2.  Rectosigmoid stent again noted. Electronically Signed   By: Marcello Moores  Register   On: 07/25/2019 13:14   DG  Tibia/Fibula Right  Result Date: 07/25/2019 CLINICAL DATA:  Fall right lower leg injury EXAM: RIGHT TIBIA AND FIBULA - 2 VIEW COMPARISON:  None. FINDINGS: Tiny ossific linear density is seen adjacent to the lateral femoral condyle which could represent a tiny chip fracture. Fragmented enthesophytes seen at the anterior tibial tubercle. Mild overlying soft tissue swelling seen around the lateral aspect of the knee. IMPRESSION: Tiny linear density seen adjacent to the lateral femoral condyle with overlying soft tissue swelling which could represent a tiny chip fracture. Please correlate with the patient's site of pain. Electronically Signed   By: Prudencio Pair M.D.   On: 07/25/2019 11:28   DG Ankle Complete Right  Result Date: 07/25/2019 CLINICAL DATA:  Patient had a mechanical fall last night around 10pm, tripped over a canister, states he is weak because the hospital would not let him walk. Reports R knee and R ankle pain. Weeping and skin tears noted to most of right tib/fib .*comment was truncated*fell, R lower leg injury EXAM: RIGHT ANKLE - COMPLETE 3+ VIEW COMPARISON:  None. FINDINGS: Ankle mortise intact. The talar dome is normal. No  malleolar fracture. The calcaneus is normal. Enthesopathic spurring of the calcaneus. IMPRESSION: No fracture or dislocation. Electronically Signed   By: Suzy Bouchard M.D.   On: 07/25/2019 11:31   DG Abd 1 View  Result Date: 07/10/2019 CLINICAL DATA:  Nasogastric tube placement. EXAM: ABDOMEN - 1 VIEW COMPARISON:  July 09, 2019 (1:53 p.m.) FINDINGS: Multiple sternal wires and sternal fixation plates and screws are seen. A nasogastric tube is seen with its distal tip overlying the body of the stomach. Numerous dilated small bowel loops are again seen throughout the abdomen and pelvis. No radio-opaque calculi or other significant radiographic abnormality are seen. IMPRESSION: Nasogastric tube positioning, as described above. Electronically Signed   By: Virgina Norfolk  M.D.   On: 07/10/2019 17:56   DG Abdomen 1 View  Result Date: 07/09/2019 CLINICAL DATA:  Tachycardia.  Diffuse abdominal pain. EXAM: ABDOMEN - 1 VIEW COMPARISON:  None. FINDINGS: There is gas throughout small and large bowel most consistent with ileus. No focal dilatation to suggest obstruction. No free air. No abnormal calcifications. Ordinary degenerative changes affect the spine. IMPRESSION: Possible ileus pattern. No evidence of frank obstruction or focal lesion. Electronically Signed   By: Nelson Chimes M.D.   On: 07/09/2019 14:22   CT ABDOMEN PELVIS W CONTRAST  Result Date: 07/10/2019 CLINICAL DATA:  Rectal tumor. Abdominal distention. No bowel movement for days. EXAM: CT ABDOMEN AND PELVIS WITH CONTRAST TECHNIQUE: Multidetector CT imaging of the abdomen and pelvis was performed using the standard protocol following bolus administration of intravenous contrast. CONTRAST:  180m OMNIPAQUE IOHEXOL 300 MG/ML  SOLN COMPARISON:  03/15/2019 FINDINGS: Lower chest: Atelectasis and patchy airspace opacity in the lower lungs. Atherosclerosis and postoperative heart. Hepatobiliary: Multifocal hepatic metastatic disease with ill-defined masses measuring up to 4 cm in the posterior right liver, roughly 5 mm smaller than the before. Evidence of biliary obstruction or stone. Pancreas: Diffuse fatty atrophy with some preservation of density at the tail. There could be a cyst at the uncinate process, incidental in this setting. Spleen: Unremarkable. Adrenals/Urinary Tract: Negative adrenals. No hydronephrosis or stone. Simple bilateral renal cysts. Unremarkable bladder. Stomach/Bowel: Known sigmoid mass with severe luminal stenosis. The adjacent wall thickening is less thickened, but there is still spiculated density extending through the serosa into the mesentery. Above this narrowing there is generalized gas and fluid dilated bowel. Vascular/Lymphatic: No acute vascular finding. Decrease in size of IMA lymph nodes.  Reproductive:No pathologic findings. Other: No ascites or pneumoperitoneum. Musculoskeletal: No acute abnormalities. IMPRESSION: 1. 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 2. Multifocal hepatic metastatic disease which is stable or slightly improved from February 2021. Electronically Signed   By: JMonte FantasiaM.D.   On: 07/10/2019 11:32   DG Abd 2 Views  Result Date: 07/13/2019 CLINICAL DATA:  Status post colonic stent placement EXAM: ABDOMEN - 2 VIEW COMPARISON:  July 10, 2019 FINDINGS: There is an apparent stent in the sigmoid colon region overlying the sacrum. There is no appreciable bowel dilatation or air-fluid level to suggest bowel obstruction. No free air. Nasogastric tube tip and side port in stomach. There are surgical clips in the upper abdomen. Lung bases are clear. IMPRESSION: Apparent stent in the sigmoid colon region overlying the sacrum. Nasogastric tube tip and side port in stomach. No bowel obstruction or free air. Lung bases clear. Electronically Signed   By: WLowella GripIII M.D.   On: 07/13/2019 11:44  DG C-Arm 1-60 Min-No Report  Result Date: 07/12/2019 Fluoroscopy was utilized by the requesting physician.  No radiographic interpretation.   ECHOCARDIOGRAM LIMITED  Result Date: 07/17/2019    ECHOCARDIOGRAM LIMITED REPORT   Patient Name:   Loretta Kluender. Date of Exam: 07/17/2019 Medical Rec #:  [id number]          Height:       74.0 in Accession #:    [id number]         Weight:       218.9 lb Date of Birth:  1950-07-28           BSA:          2.259 m Patient Age:    45 years           BP:           117/54 mmHg Patient Gender: M                  HR:           98 bpm. Exam Location:  Inpatient Procedure: Limited Echo, Intracardiac Opacification Agent, Color Doppler and            Cardiac Doppler Indications:    Congestive Heart Failure 428.0 / I50.9  History:         Patient has prior history of Echocardiogram examinations, most                 recent 04/28/2019. CAD, Prior CABG; Risk Factors:Diabetes.                 Malignant neoplasm of sigmoid colon, history of throat cancer.  Sonographer:    Darlina Sicilian RDCS Referring Phys: [id number] Nadean Corwin A [place]  1. Left ventricular ejection fraction, by estimation, is 55-60%. The left ventricle has normal function. Left ventricular endocardial border not optimally defined to evaluate regional wall motion even with definity contrast. There is severe left ventricular hypertrophy of the basal-septal segment. Left ventricular diastolic parameters are indeterminate.  2. Right ventricular systolic function was not well visualized. The right ventricular size is normal. There is normal pulmonary artery systolic pressure. The estimated right ventricular systolic pressure is 02.4 mmHg.  3. The mitral valve is normal in structure. No evidence of mitral valve regurgitation. No evidence of mitral stenosis.  4. The aortic valve is tricuspid. Aortic valve regurgitation is trivial. Mild aortic valve sclerosis is present, with no evidence of aortic valve stenosis.  5. Aortic dilatation noted. There is mild dilatation at the level of the sinuses of Valsalva measuring 43 mm.  6. The inferior vena cava is dilated in size with <50% respiratory variability, suggesting right atrial pressure of 15 mmHg. FINDINGS  Left Ventricle: Left ventricular ejection fraction, by estimation, is 55 to 60%. The left ventricle has normal function. Left ventricular endocardial border not optimally defined to evaluate regional wall motion. The left ventricular internal cavity size was normal in size. There is severe left ventricular hypertrophy of the basal-septal segment. Right Ventricle: The right ventricular size is normal. No increase in right ventricular wall thickness. Right ventricular systolic function was not well visualized. There is normal pulmonary  artery systolic pressure. The tricuspid regurgitant velocity is  1.36 m/s, and with an assumed right atrial pressure of 15 mmHg, the estimated right ventricular systolic pressure is 09.7 mmHg. Left Atrium: Left atrial size was normal in size. Right Atrium: Right atrial size was normal in size. Pericardium: There is no evidence of  pericardial effusion. Mitral Valve: The mitral valve is normal in structure. Normal mobility of the mitral valve leaflets. No evidence of mitral valve stenosis. Tricuspid Valve: The tricuspid valve is normal in structure. Tricuspid valve regurgitation is trivial. No evidence of tricuspid stenosis. Aortic Valve: The aortic valve is tricuspid. Aortic valve regurgitation is trivial. Mild aortic valve sclerosis is present, with no evidence of aortic valve stenosis. Pulmonic Valve: The pulmonic valve was normal in structure. Pulmonic valve regurgitation is not visualized. No evidence of pulmonic stenosis. Aorta: Aortic dilatation noted. There is mild dilatation at the level of the sinuses of Valsalva measuring 43 mm. Venous: The inferior vena cava is dilated in size with less than 50% respiratory variability, suggesting right atrial pressure of 15 mmHg. IAS/Shunts: No atrial level shunt detected by color flow Doppler.  LEFT VENTRICLE PLAX 2D LVIDd:         4.30 cm LVIDs:         3.55 cm LV PW:         1.05 cm LV IVS:        1.80 cm LVOT diam:     2.20 cm LV SV:         48 LV SV Index:   21 LVOT Area:     3.80 cm  LV Volumes (MOD) LV vol d, MOD A2C: 82.9 ml LV vol d, MOD A4C: 173.0 ml LV vol s, MOD A2C: 56.7 ml LV vol s, MOD A4C: 88.6 ml LV SV MOD A2C:     26.2 ml LV SV MOD A4C:     173.0 ml LV SV MOD BP:      51.9 ml LEFT ATRIUM         Index LA diam:    2.40 cm 1.06 cm/m  AORTIC VALVE LVOT Vmax:   75.00 cm/s LVOT Vmean:  57.000 cm/s LVOT VTI:    0.126 m  AORTA Ao Root diam: 4.30 cm MITRAL VALVE               TRICUSPID VALVE MV Area (PHT): 4.96 cm    TR Peak grad:   7.4 mmHg MV Decel Time:  153 msec    TR Vmax:        136.00 cm/s MV E velocity: 95.20 cm/s                            SHUNTS                            Systemic VTI:  0.13 m                            Systemic Diam: 2.20 cm Fransico Him MD Electronically signed by Fransico Him MD Signature Date/Time: 07/17/2019/10:43:14 AM    Final     ASSESSMENT AND PLAN: 1. 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 Colonoscopy 03/07/2019-nearly completely obstructing mass beginning at 16 cm from the anal verge, sigmoid colon polyp ? CTs 03/15/2019-wall thickening of the lower sigmoid colon, low rectal mass with significant luminal narrowing, diffuse liver metastases, borderline enlarged sigmoid mesocolon, iliac, and upper abdominal lymph nodes ? Elevated CEA ? 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 Cycle 1 FOLFOX 04/11/2019 ? Cycle 2 FOLFOX 05/28/2019 ? Cycle  3 FOLFOX 06/11/2019 ? Cycle 4 FOLFOX 06/25/2019 ? CT abdomen/pelvis 07/10/2019-severe obstructing stricture at the sigmoid colon, stable to slight improvement in hepatic metastatic disease compared to February 2021 2. 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 Diabetes 4. Coronary artery disease, status post coronary artery bypass surgery in 2017 5. Neutropenia following cycle 1 FOLFOX 6. Admission 04/28/2019 with COVID-19 infection 7. C. difficile colitis 04/28/2019 8. Atrial flutter during hospital admission March/April 2021-treated with a Cardizem drip, digoxin, and Eliquis anticoagulation. Converted to metoprolol at discharge.  Admission with recurrent rapid atrial fibrillation/flutter 07/09/2019 9. 07/09/2019-hospital admission for bowel obstruction  Sigmoidoscopy 07/12/2019-completely obstructing mass at  17-20 cm proximal to the anus-oozing present, stent placed 10.  Fall 07/24/2019 with acute kidney injury/rhabdomyolysis 11.  Anemia secondary to chronic disease, malnutrition, phlebotomy, GI bleeding  Mr. Paullin was discharged on 07/24/2019.  He was admitted yesterday after a fall.  He has developed acute kidney injury and rhabdomyolysis.  It is unclear whether the fall was related to orthostasis. [name] declined skilled nursing facility placement earlier this week, but it is now clear he will require placement.  He has metastatic colon cancer.  He underwent placement of a colon stent 07/12/2019.  A CT on 07/10/2019 revealed improvement in hepatic metastases.  The plan is to continue outpatient chemotherapy.  We will consider administering the next cycle of chemotherapy as an inpatient if he is not discharged by early next week.  Recommendations: 1.  Continue management of renal failure/rhabdomyolysis per the medical service 2.  Antibiotics for cellulitis and UTI? 3.  Watch for rectal bleeding while maintained on heparin prophylaxis 4.  Skilled nursing facility placement 5.  I will prescribe hydrocodone to use as needed for pain 6.  Please call oncology as needed over the weekend.  I will check on him 07/29/2019 if he remains in the hospital.  Outpatient follow-up will be scheduled at the Cancer center  LOS: 1 day   Betsy Coder, MD 07/26/19

## 2019-07-26 NOTE — Progress Notes (Addendum)
   07/25/19 2335  Assess: MEWS Score  Temp 98 F (36.7 C)  BP 115/74  Pulse Rate (!) 119  Resp (!) 22  SpO2 100 %  O2 Device Room Air  Assess: MEWS Score  MEWS Temp 0  MEWS Systolic 0  MEWS Pulse 2  MEWS RR 1  MEWS LOC 0  MEWS Score 3  MEWS Score Color Yellow  Assess: if the MEWS score is Yellow or Red  Were vital signs taken at a resting state? Yes  Focused Assessment Documented focused assessment  Early Detection of Sepsis Score *See Row Information* High  MEWS guidelines implemented *See Row Information* No, previously yellow, continue vital signs every 4 hours  Treat  MEWS Interventions Administered scheduled meds/treatments  Take Vital Signs  Increase Vital Sign Frequency  Yellow: Q 2hr X 2 then Q 4hr X 2, if remains yellow, continue Q 4hrs  Escalate  MEWS: Escalate Yellow: discuss with charge nurse/RN and consider discussing with provider and RRT  Notify: Charge Nurse/RN  Name of Charge Nurse/RN Notified Tom RN   Date Charge Nurse/RN Notified 07/25/19  Time Charge Nurse/RN Notified 2340  Yellow MEWS for Afib HR - metoprolol 100mg  po given

## 2019-07-27 DIAGNOSIS — R531 Weakness: Secondary | ICD-10-CM

## 2019-07-27 LAB — BASIC METABOLIC PANEL
Anion gap: 6 (ref 5–15)
BUN: 21 mg/dL (ref 8–23)
CO2: 27 mmol/L (ref 22–32)
Calcium: 7.7 mg/dL — ABNORMAL LOW (ref 8.9–10.3)
Chloride: 101 mmol/L (ref 98–111)
Creatinine, Ser: 0.94 mg/dL (ref 0.61–1.24)
GFR calc Af Amer: >60 mL/min (ref >60)
GFR calc non Af Amer: >60 mL/min (ref >60)
Glucose, Bld: 105 mg/dL — ABNORMAL HIGH (ref 70–99)
Potassium: 3.5 mmol/L (ref 3.5–5.1)
Sodium: 134 mmol/L — ABNORMAL LOW (ref 135–145)

## 2019-07-27 LAB — GLUCOSE, CAPILLARY
Glucose-Capillary: 142 mg/dL — ABNORMAL HIGH (ref 70–99)
Glucose-Capillary: 151 mg/dL — ABNORMAL HIGH (ref 70–99)
Glucose-Capillary: 178 mg/dL — ABNORMAL HIGH (ref 70–99)
Glucose-Capillary: 135 mg/dL — ABNORMAL HIGH (ref 70–99)

## 2019-07-27 LAB — CBC
HCT: 26.3 % — ABNORMAL LOW (ref 39.0–52.0)
Hemoglobin: 8.3 g/dL — ABNORMAL LOW (ref 13.0–17.0)
MCH: 28.7 pg (ref 26.0–34.0)
MCHC: 31.6 g/dL (ref 30.0–36.0)
MCV: 91 fL (ref 80.0–100.0)
Platelets: 269 10*3/uL (ref 150–400)
RBC: 2.89 MIL/uL — ABNORMAL LOW (ref 4.22–5.81)
RDW: 16.7 % — ABNORMAL HIGH (ref 11.5–15.5)
WBC: 7.8 10*3/uL (ref 4.0–10.5)
nRBC: 0 % (ref 0.0–0.2)

## 2019-07-27 LAB — MAGNESIUM: Magnesium: 1.4 mg/dL — ABNORMAL LOW (ref 1.7–2.4)

## 2019-07-27 MED ORDER — MAGNESIUM SULFATE 4 GM/100ML IV SOLN
4.0000 g | Freq: Once | INTRAVENOUS | Status: AC
  Administered 2019-07-27: 4 g via INTRAVENOUS
  Filled 2019-07-27: qty 100

## 2019-07-27 MED ORDER — POTASSIUM CHLORIDE CRYS ER 20 MEQ PO TBCR
40.0000 meq | EXTENDED_RELEASE_TABLET | Freq: Once | ORAL | Status: AC
  Administered 2019-07-27: 40 meq via ORAL
  Filled 2019-07-27: qty 2

## 2019-07-27 NOTE — Progress Notes (Signed)
Have alerted MD via text today of pt's sleeping HR 46.

## 2019-07-27 NOTE — Progress Notes (Signed)
PROGRESS NOTE    Clarence Andrews.  WJX:[id number] DOB: 12/25/1950 DOA: 07/25/2019 PCP: Patient, No Pcp Per   Brief Narrative:   69 y.o. male with medical history significant for persistent atrial fibrillation/flutter on digoxin and Lopressor, combined diastolic/systolic CHF , metastatic colon cancer.  Patient was recently hospitalized from 6/8-6/23/2021 for A. fib with RVR.  Went home despite of being offered SNF.  At home he tripped and fell due to weakness, found by the neighbor therefore brought to the hospital.  Work-up was negative for trauma but showed UTI and mild rhabdomyolysis.   Assessment & Plan:   Active Problems:   Cancer of left colon (HCC)   Tachycardia   AKI (acute kidney injury) ([facility])   Malignant neoplasm of colon (HCC)   Atrial flutter (HCC)   Atrial fibrillation with RVR (HCC)   Chronic systolic heart failure (HCC)   Orthostatic hypotension   Cellulitis of right leg   Rhabdomyolysis   Unwitnessed fall   Physical deconditioning   Pressure injury of skin   Mechanical fall, generalized weakness Denies any red flags or systemic symptoms.  Previously refused SNF but this time is agreeable PT/OT--SNF.  Patient agreeable this time.  TOC team working on this.  Urinary tract infection with hematuria Cultures pending, on IV cefazolin.   Right lower leg nonpurulent cellulitis Erythema and tenderness of the right lower extremity. Continue IV cefazolin, cellulitis order set used Right lower extremity Dopplers-negative for DVT   AKI, suspect prerenal.   Baseline creatinine 0.9.  Admission creatinine 1.8.  Now resolved with IV fluids.   Rhabdomyolysis in the setting of unwitnessed fall.   Improved with IV fluids   Atrial fibrillation/flutter, persistent, intermittent RVR. Currently in A. fib, rate controlled.  Poor candidate for anticoagulation Continue digoxin p.o. daily, Lopressor 100 mg p.o. twice daily   Orthostatic hypotension, possibly symptomatic Suspect  some autonomic dysfunction with a component of dehydration getting IV fluids. 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 [name] as outpatient, seen by him during this hospitalization.  Appreciate his input.  Pain medication started Bowel regimen added.   Anemia of chronic disease (chemotherapy, colon cancer) Hemoglobin stable at baseline Daily CBC   Chronic combined CHF, stable.  History of systolic CHF, TTE on most recent admission showed preserved EF. Overall appears to be euvolemic in nature.  Resume his home medication including beta-blocker.  Discontinue fluid restriction as he needs hydration for his AKI.  Closely monitor for signs of volume overload.   Type 2 diabetes, A1c 7.8.  Uncontrolled-acceptable range -Hold home glipizide.  Continue Lantus 10 units daily Insulin sliding scale and Accu-Chek   CAD status post CABG, stable Currently without any chest pain.    DVT prophylaxis: Subcu heparin Code Status: Full code Family Communication: None  Status is: Inpatient  Remains inpatient appropriate because:IV treatments appropriate due to intensity of illness or inability to take PO   Dispo: The patient is from: Home              Anticipated d/c is to: SNF              Anticipated d/c date is: 1-2 days              Patient currently  medically stable to be discharged to SNF when bed available   Body mass index is 29.53 kg/m.  Pressure Injury 07/25/19 Sacrum Stage 1 -  Intact skin with non-blanchable redness of a localized area usually over a bony prominence. (Active)  07/25/19 1800  Location: Sacrum  Location Orientation:   Staging: Stage 1 -  Intact skin with non-blanchable redness of a localized area usually  over a bony prominence.  Wound Description (Comments):   Present on Admission: Yes    Subjective: Feels ok, no complaints.   Review of Systems Otherwise negative except as per HPI, including: General = no fevers, chills, dizziness,  fatigue HEENT/EYES = negative for loss of vision, double vision, blurred vision,  sore throa Cardiovascular= negative for chest pain, palpitation Respiratory/lungs= negative for shortness of breath, cough, wheezing; hemoptysis,  Gastrointestinal= negative for nausea, vomiting, abdominal pain Genitourinary= negative for Dysuria MSK = Negative for arthralgia, myalgias Neurology= Negative for headache, numbness, tingling  Psychiatry= Negative for suicidal and homocidal ideation Skin= Negative for Rash   Examination:  Constitutional: Not in acute distress Respiratory: Clear to auscultation bilaterally Cardiovascular: Normal sinus rhythm, no rubs Abdomen: Nontender nondistended good bowel sounds Musculoskeletal: No edema noted Skin: Right LE erythema improved.  Neurologic: CN 2-12 grossly intact.  And nonfocal Psychiatric: Normal judgment and insight. Alert and oriented x 3. Normal mood.    Objective: Vitals:   07/26/19 0816 07/26/19 1407 07/26/19 2052 07/27/19 0531  BP: 134/61 115/67 133/65 136/70  Pulse: 63 65 67 63  Resp: (!) 21 18 19 18   Temp: 97.7 F (36.5 C) 97.9 F (36.6 C) (!) 97.4 F (36.3 C) 97.6 F (36.4 C)  TempSrc: Oral Oral Oral Oral  SpO2: 100% 100% 100% 100%  Weight:    104.3 kg  Height:        Intake/Output Summary (Last 24 hours) at 07/27/2019 1000 Last data filed at 07/27/2019 0600 Gross per 24 hour  Intake 747.76 ml  Output 1350 ml  Net -602.24 ml   Filed Weights   07/25/19 1006 07/27/19 0531  Weight: 97.9 kg 104.3 kg     Data Reviewed:   CBC: Recent Labs  Lab 07/21/19 0340 07/21/19 0340 07/22/19 1316 07/22/19 1316 07/23/19 0342 07/24/19 0354 07/25/19 1030 07/26/19 0322 07/27/19 0401  WBC 9.3   < >  8.6   < > 8.5 7.1 16.3* 8.0 7.8  NEUTROABS 7.0  --  6.6  --  6.3 5.1 13.8*  --   --   HGB 7.8*   < > 8.8*   < > 8.0* 7.9* 9.2* 8.3* 8.3*  HCT 25.3*   < > 28.0*   < > 25.1* 25.2* 30.0* 26.6* 26.3*  MCV 91.0   < > 90.9   < > 90.6 91.0 92.3 91.7 91.0  PLT 252   < > 303   < > 284 293 348 265 269   < > = values in this interval not displayed.   Basic Metabolic Panel: Recent Labs  Lab 07/21/19 0340 07/21/19 0340 07/22/19 1316 07/22/19 1316 07/23/19 0342 07/24/19 0354 07/25/19 1030 07/26/19 0322 07/27/19 0401  NA 132*   < > 134*   < > 136 138 136 137 134*  K 3.8   < > 4.2   < > 3.8 3.8 4.4 4.1 3.5  CL 103   < > 100   < > 102 105 100 105 101  CO2 25   < > 25   < > 24 26 21* 25  27  GLUCOSE 225*   < > 264*   < > 123* 144* 242* 262* 105*  BUN 19   < > 17   < > 19 16 28* 28* 21  CREATININE 0.99   < > 0.90   < > 0.82 0.91 1.84* 1.26* 0.94  CALCIUM 7.9*   < > 8.2*   < > 8.5* 8.1* 8.8* 8.4* 7.7*  MG 1.7  --  1.6*  --  1.8 1.5*  --   --  1.4*  PHOS 2.1*  --  2.3*  --  2.8 3.1  --   --   --    < > = values in this interval not displayed.   GFR: Estimated Creatinine Clearance: 92.8 mL/min (by C-G formula based on SCr of 0.94 mg/dL). Liver Function Tests: Recent Labs  Lab 07/21/19 0340 07/22/19 1316 07/23/19 0342 07/24/19 0354  AST 36 30 36 30  ALT 32 34 35 30  ALKPHOS 120 133* 143* 149*  BILITOT 0.3 0.7 0.6 0.6  PROT 5.7* 6.1* 6.1* 6.0*  ALBUMIN 2.1* 2.2* 2.2* 2.2*   No results for input(s): LIPASE, AMYLASE in the last 168 hours. No results for input(s): AMMONIA in the last 168 hours. Coagulation Profile: No results for input(s): INR, PROTIME in the last 168 hours. Cardiac Enzymes: Recent Labs  Lab 07/25/19 1030 07/26/19 0322  CKTOTAL 1,241* 528*   BNP (last 3 results) No results for input(s): PROBNP in the last 8760 hours. HbA1C: No results for input(s): HGBA1C in the last 72 hours. CBG: Recent Labs  Lab 07/26/19 0742 07/26/19 1131 07/26/19 1713 07/26/19 2056  07/27/19 0745  GLUCAP 285* 247* 142* 153* 142*   Lipid Profile: No results for input(s): CHOL, HDL, LDLCALC, TRIG, CHOLHDL, LDLDIRECT in the last 72 hours. Thyroid Function Tests: No results for input(s): TSH, T4TOTAL, FREET4, T3FREE, THYROIDAB in the last 72 hours. Anemia Panel: No results for input(s): VITAMINB12, FOLATE, FERRITIN, TIBC, IRON, RETICCTPCT in the last 72 hours. Sepsis Labs: No results for input(s): PROCALCITON, LATICACIDVEN in the last 168 hours.  Recent Results (from the past 240 hour(s))  C Difficile Quick Screen (NO PCR Reflex)     Status: None   Collection Time: 07/23/19  4:06 PM   Specimen: STOOL  Result Value Ref Range Status   C Diff antigen NEGATIVE NEGATIVE Final   C Diff toxin NEGATIVE NEGATIVE Final   C Diff interpretation No C. difficile detected.  Final    Comment: Performed at [facility], [place] [address] [place], [place] [address]  SARS Coronavirus 2 by RT PCR (hospital order, performed in [facility] hospital lab) Nasopharyngeal Nasopharyngeal Swab     Status: None   Collection Time: 07/25/19  2:29 PM   Specimen: Nasopharyngeal Swab  Result Value Ref Range Status   SARS Coronavirus 2 NEGATIVE NEGATIVE Final    Comment: (NOTE) SARS-CoV-2 target nucleic acids are NOT DETECTED.  The SARS-CoV-2 RNA is generally detectable in upper and lower respiratory specimens during the acute phase of infection. The lowest concentration of SARS-CoV-2 viral copies this assay can detect is 250 copies / mL. A negative result does not preclude SARS-CoV-2 infection and should not be used as the sole basis for treatment or other patient management decisions.  A negative result may occur with improper specimen collection / handling, submission of specimen other than nasopharyngeal swab, presence of viral mutation(s) within the areas targeted by this assay, and inadequate number of viral copies (<250 copies / mL).  A negative result must be combined  with clinical observations, patient history, and epidemiological information.  Fact Sheet for Patients:   StrictlyIdeas.no  Fact Sheet for Healthcare Providers: BankingDealers.co.za  This test is not yet approved or  cleared by the [place] FDA and has been authorized for detection and/or diagnosis of SARS-CoV-2 by FDA under an Emergency Use Authorization (EUA).  This EUA will remain in effect (meaning this test can be used) for the duration of the COVID-19 declaration under Section 564(b)(1) of the Act, 21 U.S.C. section 360bbb-3(b)(1), unless the authorization is terminated or revoked sooner.  Performed at [facility], [place] [address] [place], [place] [address]          Radiology Studies: DG Lumbar Spine Complete  Result Date: 07/25/2019 CLINICAL DATA:  Low back pain after fall. EXAM: LUMBAR SPINE - COMPLETE 4+ VIEW COMPARISON:  07/13/2019. FINDINGS: Diffuse multilevel degenerative change. No acute bony abnormality identified. No evidence of fracture. Rectosigmoid stent noted. IMPRESSION: 1. Diffuse multilevel degenerative change. No acute bony abnormality. 2.  Rectosigmoid stent again noted. Electronically Signed   By: Marcello Moores  Register   On: 07/25/2019 13:14   DG Tibia/Fibula Right  Result Date: 07/25/2019 CLINICAL DATA:  Fall right lower leg injury EXAM: RIGHT TIBIA AND FIBULA - 2 VIEW COMPARISON:  None. FINDINGS: Tiny ossific linear density is seen adjacent to the lateral femoral condyle which could represent a tiny chip fracture. Fragmented enthesophytes seen at the anterior tibial tubercle. Mild overlying soft tissue swelling seen around the lateral aspect of the knee. IMPRESSION: Tiny linear density seen adjacent to the lateral femoral condyle with overlying soft tissue swelling which could represent a tiny chip fracture. Please correlate with the patient's site of pain. Electronically Signed   By: Prudencio Pair M.D.   On: 07/25/2019 11:28   DG Ankle Complete Right  Result Date: 07/25/2019 CLINICAL DATA:  Patient had a mechanical fall last night around 10pm, tripped over a canister, states he is weak because the hospital would not let him walk. Reports R knee and R ankle pain. Weeping and skin tears noted to most of right tib/fib .*comment was truncated*fell, R lower leg injury EXAM: RIGHT ANKLE - COMPLETE 3+ VIEW COMPARISON:  None. FINDINGS: Ankle mortise intact. The talar dome is normal. No malleolar fracture. The calcaneus is normal. Enthesopathic spurring of the calcaneus. IMPRESSION: No fracture or dislocation. Electronically Signed   By: Suzy Bouchard M.D.   On: 07/25/2019 11:31   VAS [place] LOWER EXTREMITY VENOUS (DVT)  Result Date: 07/27/2019  Lower Venous DVTStudy Indications: Swelling.  Risk Factors: None identified. Limitations: Poor ultrasound/tissue interface. Comparison Study: No prior studies. Performing Technologist: Oliver Hum RVT  Examination Guidelines: A complete evaluation includes B-mode imaging, spectral Doppler, color Doppler, and power Doppler as needed of all accessible portions of each vessel. Bilateral testing is considered an integral part of a complete examination. Limited examinations for reoccurring indications may be performed as noted. The reflux portion of the exam is performed with the patient in reverse Trendelenburg.  +---------+---------------+---------+-----------+----------+--------------+ RIGHT    CompressibilityPhasicitySpontaneityPropertiesThrombus Aging +---------+---------------+---------+-----------+----------+--------------+ CFV      Full           Yes      Yes                                 +---------+---------------+---------+-----------+----------+--------------+ SFJ      Full                                                        +---------+---------------+---------+-----------+----------+--------------+  FV Prox  Full                                                         +---------+---------------+---------+-----------+----------+--------------+ FV Mid   Full                                                        +---------+---------------+---------+-----------+----------+--------------+ FV DistalFull                                                        +---------+---------------+---------+-----------+----------+--------------+ PFV      Full                                                        +---------+---------------+---------+-----------+----------+--------------+ POP      Full           Yes      Yes                                 +---------+---------------+---------+-----------+----------+--------------+ PTV      Full                                                        +---------+---------------+---------+-----------+----------+--------------+ PERO     Full                                                        +---------+---------------+---------+-----------+----------+--------------+   +----+---------------+---------+-----------+----------+--------------+ LEFTCompressibilityPhasicitySpontaneityPropertiesThrombus Aging +----+---------------+---------+-----------+----------+--------------+ CFV Full           Yes      Yes                                 +----+---------------+---------+-----------+----------+--------------+     Summary: RIGHT: - There is no evidence of deep vein thrombosis in the lower extremity.  - No cystic structure found in the popliteal fossa.  LEFT: - No evidence of common femoral vein obstruction.  *See table(s) above for measurements and observations. Electronically signed by Deitra Mayo MD on 07/27/2019 at 6:22:06 AM.    Final         Scheduled Meds:  Chlorhexidine Gluconate Cloth  6 each Topical Daily   digoxin  0.25 mg Oral Daily   ferrous sulfate  325 mg Oral Q breakfast   heparin  5,000 Units Subcutaneous Q8H   insulin  aspart  0-9 Units Subcutaneous TID  WC   insulin glargine  10 Units Subcutaneous Daily   magnesium oxide  800 mg Oral Daily   metoprolol tartrate  100 mg Oral BID   midodrine  5 mg Oral TID WC   senna  1 tablet Oral BID   sodium chloride flush  10-40 mL Intracatheter Q12H   Continuous Infusions:  sodium chloride 50 mL/hr at 07/26/19 1322    ceFAZolin (ANCEF) IV 1 g (07/27/19 0522)   magnesium sulfate bolus IVPB 4 g (07/27/19 0916)     LOS: 2 days   Time spent= 35 mins    Ceaser Ebeling Arsenio Loader, MD [facility]  If 7PM-7AM, please contact night-coverage  07/27/2019, 10:00 AM

## 2019-07-27 NOTE — Evaluation (Signed)
Occupational Therapy Evaluation Patient Details Name: Clarence Andrews. MRN: [id number] DOB: [birth year] Today's Date: 07/27/2019    History of Present Illness 69 y.o. male with medical history significant for persistent atrial fibrillation/flutter on digoxin and Lopressor, combined diastolic/systolic CHF , metastatic colon cancer.  Patient was recently hospitalized from 6/8-6/23/2021 for A. fib with RVR, large bowel obstruction 2* colonic mass, orthostatic hypotension. Pt was offered SNF but declined. At home he fell, now admitted with rhabdomyolysis, AKI, cellulitis RLE.   Clinical Impression   Pt admitted with the above Pt currently with functional limitations due to the deficits listed below (see OT Problem List).  Pt will benefit from skilled OT to increase their safety and independence with ADL and functional mobility for ADL to facilitate discharge to venue listed below.      Follow Up Recommendations  SNF - Pt now agreeable to SNF   Equipment Recommendations  None recommended by OT    Recommendations for Other Services       Precautions / Restrictions Precautions Precautions: Fall Precaution Comments: fell at home just prior to this admission      Mobility Bed Mobility Overal bed mobility: Needs Assistance Bed Mobility: Rolling Rolling: Max assist         General bed mobility comments: limited eval this day due to soreness from fall.  Transfers              NT            ADL either performed or assessed with clinical judgement   ADL       Grooming: Wash/dry hands;Wash/dry face;Minimal assistance;Bed level                                 General ADL Comments: pt now agreeable to SNF.  Pt declined OOB at this time.  Discussed DC plan and his need for SNF.     Vision Patient Visual Report: No change from baseline       Perception     Praxis      Pertinent Vitals/Pain Pain Score: 4  Pain Location: both legs "all over" Pain  Descriptors / Indicators: Sore Pain Intervention(s): Monitored during session;Limited activity within patient's tolerance     Hand Dominance     Extremity/Trunk Assessment     Lower Extremity Assessment RLE Deficits / Details: pain with attempted ankle DF, tolerated knee flexion to ~45* limited by pain RLE: Unable to fully assess due to pain RLE Sensation: WNL LLE Deficits / Details: , tolerated knee flexion AAROM to ~45* LLE: Unable to fully assess due to pain LLE Sensation: WNL   Cervical / Trunk Assessment Cervical / Trunk Assessment:  (unable to assess, pt unable to move into sitting position)   Communication Communication Communication: No difficulties   Cognition Arousal/Alertness: Awake/alert Behavior During Therapy: WFL for tasks assessed/performed Overall Cognitive Status: Within Functional Limits for tasks assessed                                 General Comments: pt stated he now realizes he can't manage at home alone and is agreeable to ST-SNF   General Comments               Home Living Family/patient expects to be discharged to:: Private residence Living Arrangements: Alone Available Help at Discharge: Friend(s);Available PRN/intermittently Type of Home: Mobile  home Home Access: Stairs to enter [employer] of Steps: 4   Home Layout: One level     Bathroom Shower/Tub: Occupational psychologist: Standard     Home Equipment: Environmental consultant - 2 wheels          Prior Functioning/Environment Level of Independence: Independent                 OT Problem List: Decreased strength;Decreased activity tolerance;Impaired balance (sitting and/or standing);Decreased safety awareness      OT Treatment/Interventions: Self-care/ADL training;Therapeutic exercise;DME and/or AE instruction;Therapeutic activities;Patient/family education;Balance training    OT Goals(Current goals can be found in the care plan section)    OT  Frequency: Min 2X/week    AM-PAC OT "6 Clicks" Daily Activity     Outcome Measure Help from another person eating meals?: A Little Help from another person taking care of personal grooming?: A Little Help from another person toileting, which includes using toliet, bedpan, or urinal?: Total Help from another person bathing (including washing, rinsing, drying)?: A Lot Help from another person to put on and taking off regular upper body clothing?: A Lot Help from another person to put on and taking off regular lower body clothing?: Total 6 Click Score: 12   End of Session Nurse Communication: Mobility status  Activity Tolerance: Patient limited by fatigue;Patient limited by pain Patient left: in bed;with call bell/phone within reach  OT Visit Diagnosis: Unsteadiness on feet (R26.81);Muscle weakness (generalized) (M62.81);Repeated falls (R29.6);History of falling (Z91.81)                Time: 3212-2482 OT Time Calculation (min): 16 min Charges:  OT General Charges $OT Visit: 1 Visit OT Evaluation $OT Eval Moderate Complexity: 1 Mod     Jaeven Wanzer D 07/27/2019, 5:00 PM
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
## 2019-07-28 LAB — BASIC METABOLIC PANEL WITH GFR
Anion gap: 8 (ref 5–15)
BUN: 18 mg/dL (ref 8–23)
CO2: 27 mmol/L (ref 22–32)
Calcium: 8 mg/dL — ABNORMAL LOW (ref 8.9–10.3)
Chloride: 102 mmol/L (ref 98–111)
Creatinine, Ser: 1.01 mg/dL (ref 0.61–1.24)
GFR calc Af Amer: >60 mL/min
GFR calc non Af Amer: >60 mL/min
Glucose, Bld: 109 mg/dL — ABNORMAL HIGH (ref 70–99)
Potassium: 4.4 mmol/L (ref 3.5–5.1)
Sodium: 137 mmol/L (ref 135–145)

## 2019-07-28 LAB — URINE CULTURE: Culture: 30000 — AB

## 2019-07-28 LAB — GLUCOSE, CAPILLARY
Glucose-Capillary: 106 mg/dL — ABNORMAL HIGH (ref 70–99)
Glucose-Capillary: 139 mg/dL — ABNORMAL HIGH (ref 70–99)
Glucose-Capillary: 140 mg/dL — ABNORMAL HIGH (ref 70–99)
Glucose-Capillary: 121 mg/dL — ABNORMAL HIGH (ref 70–99)
Glucose-Capillary: 115 mg/dL — ABNORMAL HIGH (ref 70–99)

## 2019-07-28 LAB — MAGNESIUM: Magnesium: 1.6 mg/dL — ABNORMAL LOW (ref 1.7–2.4)

## 2019-07-28 LAB — CBC
HCT: 25.7 % — ABNORMAL LOW (ref 39.0–52.0)
Hemoglobin: 7.9 g/dL — ABNORMAL LOW (ref 13.0–17.0)
MCH: 28.4 pg (ref 26.0–34.0)
MCHC: 30.7 g/dL (ref 30.0–36.0)
MCV: 92.4 fL (ref 80.0–100.0)
Platelets: 290 K/uL (ref 150–400)
RBC: 2.78 MIL/uL — ABNORMAL LOW (ref 4.22–5.81)
RDW: 16.7 % — ABNORMAL HIGH (ref 11.5–15.5)
WBC: 7.4 K/uL (ref 4.0–10.5)
nRBC: 0 % (ref 0.0–0.2)

## 2019-07-28 MED ORDER — MAGNESIUM SULFATE 4 GM/100ML IV SOLN
4.0000 g | Freq: Once | INTRAVENOUS | Status: AC
  Administered 2019-07-28: 4 g via INTRAVENOUS
  Filled 2019-07-28: qty 100

## 2019-07-28 NOTE — Progress Notes (Signed)
PROGRESS NOTE    [name]  PZW:[id number] DOB: Feb 05, 1950 DOA: 07/25/2019 PCP: Patient, No Pcp Per   Brief Narrative:   69 y.o. male with medical history significant for persistent atrial fibrillation/flutter on digoxin and Lopressor, combined diastolic/systolic CHF , metastatic colon cancer.  Patient was recently hospitalized from 6/8-6/23/2021 for A. fib with RVR.  Went home despite of being offered SNF.  At home he tripped and fell due to weakness, found by the neighbor therefore brought to the hospital.  Work-up was negative for trauma but showed UTI and mild rhabdomyolysis.   Assessment & Plan:   Active Problems:   Cancer of left colon (HCC)   Tachycardia   AKI (acute kidney injury) ([place])   Malignant neoplasm of colon (HCC)   Atrial flutter (HCC)   Atrial fibrillation with RVR (HCC)   Chronic systolic heart failure (HCC)   Orthostatic hypotension   Cellulitis of right leg   Rhabdomyolysis   Unwitnessed fall   Physical deconditioning   Pressure injury of skin   Mechanical fall, generalized weakness Denies any red flags or systemic symptoms.  Previously refused SNF but this time is agreeable PT/OT--SNF.  Patient agreeable, TOC team working COVID-19 for placement ordered  Urinary tract infection with hematuria Urine cultures-Proteus vulgaris.  Already on IV cefazolin.   Right lower leg nonpurulent cellulitis, improving Erythema and tenderness of the right lower extremity. Continue IV cefazolin-plan for total 5 days of antibiotics. cellulitis order set used Right lower extremity Dopplers-negative for DVT   AKI, suspect prerenal.   Baseline creatinine 0.9.  Creatinine today 1.01   Rhabdomyolysis in the setting of unwitnessed fall.   Improved with IV fluids   Atrial fibrillation/flutter, persistent, intermittent RVR. Currently in A. fib, rate controlled.  Poor candidate for anticoagulation Continue digoxin p.o. daily, Lopressor 100 mg p.o. twice daily    Orthostatic hypotension, possibly symptomatic Suspect some autonomic dysfunction with a component of dehydration getting IV fluids. 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 [name] as outpatient, seen by him during this hospitalization.  Appreciate his input.  Pain medication started Bowel regimen   Anemia of chronic disease (chemotherapy, colon cancer) Hemoglobin stable at baseline Daily CBC   Chronic combined CHF, stable.  History of systolic CHF, TTE on most recent admission showed preserved EF. Overall appears to be euvolemic in nature.  Resume his home medication including beta-blocker.  Discontinue fluid restriction as he needs hydration for his AKI.  Closely monitor for signs of volume overload.   Type 2 diabetes, A1c 7.8.  Uncontrolled-current acceptable range -Hold home glipizide.  Continue Lantus 10 units daily Insulin sliding scale and Accu-Chek   CAD status post CABG, stable Currently without any chest pain.    DVT prophylaxis: Subcu heparin Code Status: Full code Family Communication: None  Status is: Inpatient  Remains inpatient appropriate because:IV treatments appropriate due to intensity of illness or inability to take PO   Dispo: The patient is from: Home              Anticipated d/c is to: SNF              Anticipated d/c date is: 1-2 days  Patient currently medically stable for SNF placement, awaiting bed  Body mass index is 28.9 kg/m.  Pressure Injury 07/25/19 Sacrum Stage 1 -  Intact skin with non-blanchable redness of a localized area usually over a bony prominence. (Active)  07/25/19 1800  Location: Sacrum  Location Orientation:   Staging: Stage 1 -  Intact skin with  non-blanchable redness of a localized area usually over a bony prominence.  Wound Description (Comments):   Present on Admission: Yes    Subjective: No complaints feels okay.  Right lower extremity erythema improved  Review of Systems Otherwise negative except as per HPI, including: General: Denies fever, chills, night sweats or unintended weight loss. Resp: Denies cough, wheezing, shortness of breath. Cardiac: Denies chest pain, palpitations, orthopnea, paroxysmal nocturnal dyspnea. GI: Denies abdominal pain, nausea, vomiting, diarrhea or constipation GU: Denies dysuria, frequency, hesitancy or incontinence MS: Denies muscle aches, joint pain or swelling Neuro: Denies headache, neurologic deficits (focal weakness, numbness, tingling), abnormal gait Psych: Denies anxiety, depression, SI/HI/AVH Skin: Denies new rashes or lesions ID: Denies sick contacts, exotic exposures, travel  Examination:  Constitutional: Not in acute distress Respiratory: Clear to auscultation bilaterally Cardiovascular: Normal sinus rhythm, no rubs Abdomen: Nontender nondistended good bowel sounds Musculoskeletal: No edema noted Skin: Right lower extremity erythema improved Neurologic: CN 2-12 grossly intact.  And nonfocal Psychiatric: Normal judgment and insight. Alert and oriented x 3. Normal mood.  Objective: Vitals:   07/27/19 1407 07/27/19 2024 07/28/19 0603 07/28/19 0917  BP: 136/64 137/75 (!) 142/78 (!) 150/80  Pulse: (!) 48 84 74 73  Resp: 17 20 18    Temp: 98.5 F (36.9 C) 98.4 F (36.9 C) 98.2 F (36.8 C)   TempSrc: Oral     SpO2: 100% 100% 99% 100%  Weight:   102.1 kg   Height:        Intake/Output Summary (Last 24 hours) at 07/28/2019 0941 Last data filed at 07/28/2019 0600 Gross per 24 hour  Intake 720 ml  Output 1150 ml  Net -430 ml   Filed Weights   07/25/19 1006 07/27/19 0531 07/28/19 0603  Weight: 97.9 kg 104.3 kg 102.1 kg     Data Reviewed:   CBC: Recent Labs  Lab  07/22/19 1316 07/22/19 1316 07/23/19 0342 07/23/19 0342 07/24/19 0354 07/25/19 1030 07/26/19 0322 07/27/19 0401 07/28/19 0405  WBC 8.6   < > 8.5   < > 7.1 16.3* 8.0 7.8 7.4  NEUTROABS 6.6  --  6.3  --  5.1 13.8*  --   --   --   HGB 8.8*   < > 8.0*   < > 7.9* 9.2* 8.3* 8.3* 7.9*  HCT 28.0*   < > 25.1*   < > 25.2* 30.0* 26.6* 26.3* 25.7*  MCV 90.9   < > 90.6   < > 91.0 92.3 91.7 91.0 92.4  PLT 303   < > 284   < > 293 348 265 269 290   < > = values in this interval not displayed.   Basic Metabolic Panel: Recent Labs  Lab 07/22/19 1316 07/22/19 1316 07/23/19 0342 07/23/19 0342 07/24/19 0354 07/25/19 1030 07/26/19 0322 07/27/19 0401 07/28/19 0405  NA 134*   < > 136   < > 138 136 137 134* 137  K 4.2   < > 3.8   < > 3.8 4.4 4.1 3.5 4.4  CL 100   < > 102   < > 105 100 105 101 102  CO2 25   < >  24   < > 26 21* 25 27 27   GLUCOSE 264*   < > 123*   < > 144* 242* 262* 105* 109*  BUN 17   < > 19   < > 16 28* 28* 21 18  CREATININE 0.90   < > 0.82   < > 0.91 1.84* 1.26* 0.94 1.01  CALCIUM 8.2*   < > 8.5*   < > 8.1* 8.8* 8.4* 7.7* 8.0*  MG 1.6*  --  1.8  --  1.5*  --   --  1.4* 1.6*  PHOS 2.3*  --  2.8  --  3.1  --   --   --   --    < > = values in this interval not displayed.   GFR: Estimated Creatinine Clearance: 85.6 mL/min (by C-G formula based on SCr of 1.01 mg/dL). Liver Function Tests: Recent Labs  Lab 07/22/19 1316 07/23/19 0342 07/24/19 0354  AST 30 36 30  ALT 34 35 30  ALKPHOS 133* 143* 149*  BILITOT 0.7 0.6 0.6  PROT 6.1* 6.1* 6.0*  ALBUMIN 2.2* 2.2* 2.2*   No results for input(s): LIPASE, AMYLASE in the last 168 hours. No results for input(s): AMMONIA in the last 168 hours. Coagulation Profile: No results for input(s): INR, PROTIME in the last 168 hours. Cardiac Enzymes: Recent Labs  Lab 07/25/19 1030 07/26/19 0322  CKTOTAL 1,241* 528*   BNP (last 3 results) No results for input(s): PROBNP in the last 8760 hours. HbA1C: No results for input(s): HGBA1C  in the last 72 hours. CBG: Recent Labs  Lab 07/27/19 0745 07/27/19 1113 07/27/19 1625 07/27/19 2057 07/28/19 0745  GLUCAP 142* 151* 178* 135* 106*   Lipid Profile: No results for input(s): CHOL, HDL, LDLCALC, TRIG, CHOLHDL, LDLDIRECT in the last 72 hours. Thyroid Function Tests: No results for input(s): TSH, T4TOTAL, FREET4, T3FREE, THYROIDAB in the last 72 hours. Anemia Panel: No results for input(s): VITAMINB12, FOLATE, FERRITIN, TIBC, IRON, RETICCTPCT in the last 72 hours. Sepsis Labs: No results for input(s): PROCALCITON, LATICACIDVEN in the last 168 hours.  Recent Results (from the past 240 hour(s))  C Difficile Quick Screen (NO PCR Reflex)     Status: None   Collection Time: 07/23/19  4:06 PM   Specimen: STOOL  Result Value Ref Range Status   C Diff antigen NEGATIVE NEGATIVE Final   C Diff toxin NEGATIVE NEGATIVE Final   C Diff interpretation No C. difficile detected.  Final    Comment: Performed at [facility], [place] [address], [place], [place] [address]  Culture, Urine     Status: Abnormal (Preliminary result)   Collection Time: 07/25/19 12:13 PM   Specimen: Urine, Random  Result Value Ref Range Status   Specimen Description   Final    URINE, RANDOM Performed at [place] [address] [place], [place] [address]    Special Requests   Final    NONE Performed at [facility], [place] [address], [place], [place] [address]    Culture 30,000 COLONIES/mL PROTEUS VULGARIS (A)  Final   Report Status PENDING  Incomplete  SARS Coronavirus 2 by RT PCR (hospital order, performed in [facility] hospital lab) Nasopharyngeal Nasopharyngeal Swab     Status: None   Collection Time: 07/25/19  2:29 PM   Specimen: Nasopharyngeal Swab  Result Value Ref Range Status   SARS Coronavirus 2 NEGATIVE NEGATIVE Final    Comment: (NOTE) SARS-CoV-2 target nucleic acids are NOT  DETECTED.  The SARS-CoV-2 RNA is generally detectable  in upper and lower respiratory specimens during the acute phase of infection. The lowest concentration of SARS-CoV-2 viral copies this assay can detect is 250 copies / mL. A negative result does not preclude SARS-CoV-2 infection and should not be used as the sole basis for treatment or other patient management decisions.  A negative result may occur with improper specimen collection / handling, submission of specimen other than nasopharyngeal swab, presence of viral mutation(s) within the areas targeted by this assay, and inadequate number of viral copies (<250 copies / mL). A negative result must be combined with clinical observations, patient history, and epidemiological information.  Fact Sheet for Patients:   StrictlyIdeas.no  Fact Sheet for Healthcare Providers: BankingDealers.co.za  This test is not yet approved or  cleared by the [place] FDA and has been authorized for detection and/or diagnosis of SARS-CoV-2 by FDA under an Emergency Use Authorization (EUA).  This EUA will remain in effect (meaning this test can be used) for the duration of the COVID-19 declaration under Section 564(b)(1) of the Act, 21 U.S.C. section 360bbb-3(b)(1), unless the authorization is terminated or revoked sooner.  Performed at [facility], [place] [address] [place], [place] [address]          Radiology Studies: VAS [place] LOWER EXTREMITY VENOUS (DVT)  Result Date: 07/27/2019  Lower Venous DVTStudy Indications: Swelling.  Risk Factors: None identified. Limitations: Poor ultrasound/tissue interface. Comparison Study: No prior studies. Performing Technologist: Oliver Hum RVT  Examination Guidelines: A complete evaluation includes B-mode imaging, spectral Doppler, color Doppler, and power Doppler as needed of all accessible portions of each vessel. Bilateral testing is considered an integral part of a complete examination.  Limited examinations for reoccurring indications may be performed as noted. The reflux portion of the exam is performed with the patient in reverse Trendelenburg.  +---------+---------------+---------+-----------+----------+--------------+  RIGHT     Compressibility Phasicity Spontaneity Properties Thrombus Aging  +---------+---------------+---------+-----------+----------+--------------+  CFV       Full            Yes       Yes                                    +---------+---------------+---------+-----------+----------+--------------+  SFJ       Full                                                             +---------+---------------+---------+-----------+----------+--------------+  FV Prox   Full                                                             +---------+---------------+---------+-----------+----------+--------------+  FV Mid    Full                                                             +---------+---------------+---------+-----------+----------+--------------+  FV Distal Full                                                             +---------+---------------+---------+-----------+----------+--------------+  PFV       Full                                                             +---------+---------------+---------+-----------+----------+--------------+  POP       Full            Yes       Yes                                    +---------+---------------+---------+-----------+----------+--------------+  PTV       Full                                                             +---------+---------------+---------+-----------+----------+--------------+  PERO      Full                                                             +---------+---------------+---------+-----------+----------+--------------+   +----+---------------+---------+-----------+----------+--------------+  LEFT Compressibility Phasicity Spontaneity Properties Thrombus Aging   +----+---------------+---------+-----------+----------+--------------+  CFV  Full            Yes       Yes                                    +----+---------------+---------+-----------+----------+--------------+     Summary: RIGHT: - There is no evidence of deep vein thrombosis in the lower extremity.  - No cystic structure found in the popliteal fossa.  LEFT: - No evidence of common femoral vein obstruction.  *See table(s) above for measurements and observations. Electronically signed by Deitra Mayo MD on 07/27/2019 at 6:22:06 AM.    Final         Scheduled Meds:  Chlorhexidine Gluconate Cloth  6 each Topical Daily   digoxin  0.25 mg Oral Daily   ferrous sulfate  325 mg Oral Q breakfast   heparin  5,000 Units Subcutaneous Q8H   insulin aspart  0-9 Units Subcutaneous TID WC   insulin glargine  10 Units Subcutaneous Daily   magnesium oxide  800 mg Oral Daily   metoprolol tartrate  100 mg Oral BID   midodrine  5 mg Oral TID WC   senna  1 tablet Oral BID   sodium chloride flush  10-40 mL Intracatheter Q12H   Continuous Infusions:   ceFAZolin (ANCEF) IV 1 g (07/28/19 0544)   magnesium sulfate bolus IVPB  LOS: 3 days   Time spent= 35 mins    Demichael Traum Arsenio Loader, MD [facility]  If 7PM-7AM, please contact night-coverage  07/28/2019, 9:41 AM

## 2019-07-29 LAB — GLUCOSE, CAPILLARY
Glucose-Capillary: 91 mg/dL (ref 70–99)
Glucose-Capillary: 114 mg/dL — ABNORMAL HIGH (ref 70–99)
Glucose-Capillary: 197 mg/dL — ABNORMAL HIGH (ref 70–99)
Glucose-Capillary: 129 mg/dL — ABNORMAL HIGH (ref 70–99)

## 2019-07-29 LAB — BASIC METABOLIC PANEL
Anion gap: 8 (ref 5–15)
BUN: 16 mg/dL (ref 8–23)
CO2: 29 mmol/L (ref 22–32)
Calcium: 8.5 mg/dL — ABNORMAL LOW (ref 8.9–10.3)
Chloride: 102 mmol/L (ref 98–111)
Creatinine, Ser: 0.83 mg/dL (ref 0.61–1.24)
GFR calc Af Amer: >60 mL/min (ref >60)
GFR calc non Af Amer: >60 mL/min (ref >60)
Glucose, Bld: 86 mg/dL (ref 70–99)
Potassium: 4 mmol/L (ref 3.5–5.1)
Sodium: 139 mmol/L (ref 135–145)

## 2019-07-29 LAB — SARS CORONAVIRUS 2 (TAT 6-24 HRS): SARS Coronavirus 2: NEGATIVE

## 2019-07-29 LAB — CBC
HCT: 26.9 % — ABNORMAL LOW (ref 39.0–52.0)
Hemoglobin: 8.2 g/dL — ABNORMAL LOW (ref 13.0–17.0)
MCH: 28.5 pg (ref 26.0–34.0)
MCHC: 30.5 g/dL (ref 30.0–36.0)
MCV: 93.4 fL (ref 80.0–100.0)
Platelets: 275 10*3/uL (ref 150–400)
RBC: 2.88 MIL/uL — ABNORMAL LOW (ref 4.22–5.81)
RDW: 16.4 % — ABNORMAL HIGH (ref 11.5–15.5)
WBC: 6.1 10*3/uL (ref 4.0–10.5)
nRBC: 0 % (ref 0.0–0.2)

## 2019-07-29 LAB — MAGNESIUM: Magnesium: 1.9 mg/dL (ref 1.7–2.4)

## 2019-07-29 MED ORDER — SENNOSIDES-DOCUSATE SODIUM 8.6-50 MG PO TABS: 1.0000 | ORAL_TABLET | Freq: Every evening | ORAL | Status: DC | PRN

## 2019-07-29 MED ORDER — CEPHALEXIN 500 MG PO CAPS: 500.0000 mg | ORAL_CAPSULE | Freq: Four times a day (QID) | ORAL | 0 refills | Status: DC

## 2019-07-29 MED ORDER — POLYVINYL ALCOHOL 1.4 % OP SOLN
1.0000 [drp] | OPHTHALMIC | Status: DC | PRN
  Filled 2019-07-29: qty 15

## 2019-07-29 MED ORDER — SENNA 8.6 MG PO TABS: 1.0000 | ORAL_TABLET | Freq: Two times a day (BID) | ORAL | 0 refills | Status: DC

## 2019-07-29 MED ORDER — POLYETHYLENE GLYCOL 3350 17 G PO PACK: 17.0000 g | PACK | Freq: Every day | ORAL | 0 refills | Status: DC | PRN

## 2019-07-29 NOTE — TOC Transition Note (Signed)
Transition of Care [facility]) - CM/SW Discharge Note   Patient Details  Name: Clarence Andrews. MRN: [id number] Date of Birth: 1950/02/26  [facility] CM/SW Contact:  Ross Ludwig, LCSW Phone Number: 07/29/2019, 4:19 PM   Clinical Narrative:    CSW presented bed offers to patient and he chose [facility] for short term rehab.  CSW spoke to [place] at [facility] and they can accept patient pending insurance authorization.  Patient is managed by regular [employer], SNF will start insurance authorization.     Barriers to Discharge: Continued Medical Work up   Patient Goals and CMS Choice Patient states their goals for this hospitalization and ongoing recovery are:: agreeable now with SNF CMS Medicare.gov Compare Post Acute Care list provided to:: Patient Choice offered to / list presented to : Patient  Discharge Placement                       Discharge Plan and Services In-house Referral: Clinical Social Work   Post Acute Care Choice: [place]                               Social Determinants of Health (SDOH) Interventions     Readmission Risk Interventions No flowsheet data found.

## 2019-07-29 NOTE — Discharge Summary (Addendum)
Physician Discharge Summary  [name] SWH:[id number] DOB: February 06, 1950 DOA: 07/25/2019  PCP: Patient, No Pcp Per  Admit date: 07/25/2019 Discharge date: 08/21/19  Admitted From: Home Disposition: SNF  Recommendations for Outpatient Follow-up:  1. Follow up with PCP in 1-2 weeks 2. Please obtain BMP/CBC in one week your next doctors visit.  3. Outpatient follow-up with oncology for his chemo cycle   Discharge Condition: Stable CODE STATUS: Full code Diet recommendation: Heart healthy  Brief/Interim Summary: 69 y.o.malewith medical history significant forpersistent atrial fibrillation/flutter on digoxin and Lopressor, combined diastolic/systolic CHF, metastatic colon cancer. Patient was recently hospitalized from 6/8-6/23/2021 for A. fib with RVR.  Went home despite of being offered SNF.  At home he tripped and fell due to weakness, found by the neighbor therefore brought to the hospital.  Work-up was negative for trauma but showed UTI and mild rhabdomyolysis.  His urinary tract infection, cellulitis was treated with IV antibiotics.  Seen by PT who recommended SNF, arrangements made.  Rest of the details below.   Assessment & Plan:   Active Problems:   Cancer of left colon (HCC)   Tachycardia   AKI (acute kidney injury) ([place])   Malignant neoplasm of colon (HCC)   Atrial flutter (HCC)   Atrial fibrillation with RVR (HCC)   Chronic systolic heart failure (HCC)   Orthostatic hypotension   Cellulitis of right leg   Rhabdomyolysis   Unwitnessed fall   Physical deconditioning   Pressure injury of skin   Mechanical fall, generalized weakness Denies any red flags or systemic symptoms.  Previously refused SNF but this time is agreeable PT OT-SNF  Urinary tract infection with hematuria Urine cultures-Proteus vulgaris.  Completed Abx course  Right lower leg nonpurulent cellulitis, improving Erythema and tenderness of the right lower extremity. Transition to Keflex  for 2 more days. Right lower extremity Dopplers-negative for DVT  AKI, suspect prerenal.  Baseline creatinine 0.9.    Remained stable  Rhabdomyolysis in the setting of unwitnessed fall.  Resolved with IV fluids  Atrial fibrillation/flutter, persistent, intermittent RVR. Currently in A. fib, rate controlled.  Poor candidate for anticoagulation Continue digoxin p.o. daily, Lopressor 100 mg p.o. twice daily  Orthostatic hypotension, possibly symptomatic Suspect some autonomic dysfunction with a component of dehydration getting IV fluids. Continue midodrine 5 mg 3 times daily  COPD, stable No wheezing -Continue home Xopenex  Metastatic colon cancer complicated by large bowel obstruction related to rectal mass, status post colonic stent on 6/11, stable No abdominal pain, reports had bowel movement prior to admission -Followed by Dr. [name] as outpatient, seen by him during this hospitalization.  He underwent chemotherapy during this hospitalization.  Anemia of chronic disease (chemotherapy, colon cancer) Hemoglobin stable at baseline Daily CBC  Chronic combined CHF, stable. History of systolic CHF, TTE on most recent admission showed preserved EF. Overall appears to be euvolemic in nature.  Resume his home medication including beta-blocker.  Discontinue fluid restriction as he needs hydration for his AKI.  Closely monitor for signs of volume overload.  Type 2 diabetes, A1c 7.8.  Uncontrolled-current acceptable range Resume home regimen upon discharge  CAD status post CABG, stable Currently without any chest pain.    Body mass index is 27.09 kg/m.  Pressure Injury 07/25/19 Sacrum Stage 1 -  Intact skin with non-blanchable redness of a localized area usually over a bony prominence. (Active)  07/25/19 1800  Location: Sacrum  Location Orientation:   Staging: Stage 1 -  Intact skin with non-blanchable redness of  a localized area usually over a bony prominence.   Wound Description (Comments):   Present on Admission: Yes        Discharge Diagnoses:  Active Problems:   Cancer of left colon (HCC)   Tachycardia   AKI (acute kidney injury) ([place])   Malignant neoplasm of colon (HCC)   Atrial flutter (HCC)   Atrial fibrillation with RVR (HCC)   Chronic systolic heart failure (HCC)   Orthostatic hypotension   Cellulitis of right leg   Rhabdomyolysis   Unwitnessed fall   Physical deconditioning   Pressure injury of skin      Consultations:  Oncology  Subjective: Feels okay no complaints  Discharge Exam: Vitals:   08/02/19 0700 08/02/19 1012  BP: 140/64 (!) 134/52  Pulse:  (!) 38  Resp: 16 14  Temp: 98 F (36.7 C) 97.7 F (36.5 C)  SpO2: 98% 100%   Vitals:   08/01/19 1454 08/01/19 2236 08/02/19 0700 08/02/19 1012  BP: (!) 105/59 (!) 142/66 140/64 (!) 134/52  Pulse: 60 62  (!) 38  Resp: 16 15 16 14   Temp: (!) 97.5 F (36.4 C) 97.9 F (36.6 C) 98 F (36.7 C) 97.7 F (36.5 C)  TempSrc: Oral  Oral Oral  SpO2: 99% 97% 98% 100%  Weight:      Height:        General: Pt is alert, awake, not in acute distress Cardiovascular: RRR, S1/S2 +, no rubs, no gallops Respiratory: CTA bilaterally, no wheezing, no rhonchi Abdominal: Soft, NT, ND, bowel sounds + Extremities: no edema, no cyanosis, right lower extremity erythema has nearly resolved  Discharge Instructions   Allergies as of 08/02/2019   No Known Allergies     Medication List    TAKE these medications   ascorbic acid 500 MG tablet Commonly known as: VITAMIN C Take 1 tablet (500 mg total) by mouth daily.   Chromium-Cinnamon [phone] MCG-MG Caps Take 2,000 mg by mouth daily.   D3-1000 PO Take 1,000 Units by mouth daily.   digoxin 0.25 MG tablet Commonly known as: LANOXIN Take 1 tablet (0.25 mg total) by mouth daily.   ferrous sulfate 325 (65 FE) MG EC tablet Take 325 mg by mouth daily with breakfast.   glipiZIDE 10 MG tablet Commonly known as:  GLUCOTROL Take 10 mg by mouth daily before breakfast.   levalbuterol 45 MCG/ACT inhaler Commonly known as: XOPENEX HFA Inhale 2 puffs into the lungs every 6 (six) hours as needed for wheezing.   lidocaine-prilocaine cream Commonly known as: EMLA Apply to port site 1-2 hours prior to use   loperamide 2 MG capsule Commonly known as: IMODIUM Take 1 capsule by mouth 4 (four) times daily as needed for diarrhea or loose stools.   magnesium oxide 400 MG tablet Commonly known as: MAG-OX Take 800 mg by mouth daily.   metoprolol tartrate 100 MG tablet Commonly known as: LOPRESSOR Take 1 tablet (100 mg total) by mouth 2 (two) times daily.   midodrine 5 MG tablet Commonly known as: PROAMATINE Take 1 tablet (5 mg total) by mouth 3 (three) times daily with meals.   polyethylene glycol 17 g packet Commonly known as: MIRALAX / GLYCOLAX Take 17 g by mouth daily as needed for moderate constipation or severe constipation.   senna 8.6 MG Tabs tablet Commonly known as: SENOKOT Take 1 tablet (8.6 mg total) by mouth 2 (two) times daily.   senna-docusate 8.6-50 MG tablet Commonly known as: Senokot-S Take 1 tablet by mouth at  bedtime as needed for mild constipation.   Turmeric 500 MG Caps Take 2,000 mg by mouth daily. Taking 2 - 1000 mg tabs daily   vitamin B-12 500 MCG tablet Commonly known as: CYANOCOBALAMIN Take 500 mcg by mouth daily.       Follow-up Information    Nahser, Wonda Cheng, MD. Schedule an appointment as soon as possible for a visit in 1 week(s).   Specialty: Cardiology Contact information: [facility] [address] [address] [phone]              No Known Allergies  You were cared for by a hospitalist during your hospital stay. If you have any questions about your discharge medications or the care you received while you were in the hospital after you are discharged, you can call the unit and asked to speak with the hospitalist on call if the  hospitalist that took care of you is not available. Once you are discharged, your primary care physician will handle any further medical issues. Please note that no refills for any discharge medications will be authorized once you are discharged, as it is imperative that you return to your primary care physician (or establish a relationship with a primary care physician if you do not have one) for your aftercare needs so that they can reassess your need for medications and monitor your lab values.   Procedures/Studies: DG Chest 1 View  Result Date: 07/09/2019 CLINICAL DATA:  Tachycardia and diffuse abdominal pain. EXAM: CHEST  1 VIEW COMPARISON:  04/28/2019 FINDINGS: Previous median sternotomy. Power port inserted from a right jugular approach with tip at the SVC RA junction. Heart size is upper limits of normal with some left ventricular prominence. No acute mediastinal finding. The pulmonary vascularity is normal. The lungs are clear. No infiltrate, collapse or effusion. IMPRESSION: No active disease. Electronically Signed   By: Nelson Chimes M.D.   On: 07/09/2019 14:21   DG Lumbar Spine Complete  Result Date: 07/25/2019 CLINICAL DATA:  Low back pain after fall. EXAM: LUMBAR SPINE - COMPLETE 4+ VIEW COMPARISON:  07/13/2019. FINDINGS: Diffuse multilevel degenerative change. No acute bony abnormality identified. No evidence of fracture. Rectosigmoid stent noted. IMPRESSION: 1. Diffuse multilevel degenerative change. No acute bony abnormality. 2.  Rectosigmoid stent again noted. Electronically Signed   By: Marcello Moores  Register   On: 07/25/2019 13:14   DG Tibia/Fibula Right  Result Date: 07/25/2019 CLINICAL DATA:  Fall right lower leg injury EXAM: RIGHT TIBIA AND FIBULA - 2 VIEW COMPARISON:  None. FINDINGS: Tiny ossific linear density is seen adjacent to the lateral femoral condyle which could represent a tiny chip fracture. Fragmented enthesophytes seen at the anterior tibial tubercle. Mild overlying soft  tissue swelling seen around the lateral aspect of the knee. IMPRESSION: Tiny linear density seen adjacent to the lateral femoral condyle with overlying soft tissue swelling which could represent a tiny chip fracture. Please correlate with the patient's site of pain. Electronically Signed   By: Prudencio Pair M.D.   On: 07/25/2019 11:28   DG Ankle Complete Right  Result Date: 07/25/2019 CLINICAL DATA:  Patient had a mechanical fall last night around 10pm, tripped over a canister, states he is weak because the hospital would not let him walk. Reports R knee and R ankle pain. Weeping and skin tears noted to most of right tib/fib .*comment was truncated*fell, R lower leg injury EXAM: RIGHT ANKLE - COMPLETE 3+ VIEW COMPARISON:  None. FINDINGS: Ankle mortise intact. The talar  dome is normal. No malleolar fracture. The calcaneus is normal. Enthesopathic spurring of the calcaneus. IMPRESSION: No fracture or dislocation. Electronically Signed   By: Suzy Bouchard M.D.   On: 07/25/2019 11:31   DG Abd 1 View  Result Date: 07/10/2019 CLINICAL DATA:  Nasogastric tube placement. EXAM: ABDOMEN - 1 VIEW COMPARISON:  July 09, 2019 (1:53 p.m.) FINDINGS: Multiple sternal wires and sternal fixation plates and screws are seen. A nasogastric tube is seen with its distal tip overlying the body of the stomach. Numerous dilated small bowel loops are again seen throughout the abdomen and pelvis. No radio-opaque calculi or other significant radiographic abnormality are seen. IMPRESSION: Nasogastric tube positioning, as described above. Electronically Signed   By: Virgina Norfolk M.D.   On: 07/10/2019 17:56   DG Abdomen 1 View  Result Date: 07/09/2019 CLINICAL DATA:  Tachycardia.  Diffuse abdominal pain. EXAM: ABDOMEN - 1 VIEW COMPARISON:  None. FINDINGS: There is gas throughout small and large bowel most consistent with ileus. No focal dilatation to suggest obstruction. No free air. No abnormal calcifications. Ordinary degenerative  changes affect the spine. IMPRESSION: Possible ileus pattern. No evidence of frank obstruction or focal lesion. Electronically Signed   By: Nelson Chimes M.D.   On: 07/09/2019 14:22   CT ABDOMEN PELVIS W CONTRAST  Result Date: 07/10/2019 CLINICAL DATA:  Rectal tumor. Abdominal distention. No bowel movement for days. EXAM: CT ABDOMEN AND PELVIS WITH CONTRAST TECHNIQUE: Multidetector CT imaging of the abdomen and pelvis was performed using the standard protocol following bolus administration of intravenous contrast. CONTRAST:  138mL OMNIPAQUE IOHEXOL 300 MG/ML  SOLN COMPARISON:  03/15/2019 FINDINGS: Lower chest: Atelectasis and patchy airspace opacity in the lower lungs. Atherosclerosis and postoperative heart. Hepatobiliary: Multifocal hepatic metastatic disease with ill-defined masses measuring up to 4 cm in the posterior right liver, roughly 5 mm smaller than the before. Evidence of biliary obstruction or stone. Pancreas: Diffuse fatty atrophy with some preservation of density at the tail. There could be a cyst at the uncinate process, incidental in this setting. Spleen: Unremarkable. Adrenals/Urinary Tract: Negative adrenals. No hydronephrosis or stone. Simple bilateral renal cysts. Unremarkable bladder. Stomach/Bowel: Known sigmoid mass with severe luminal stenosis. The adjacent wall thickening is less thickened, but there is still spiculated density extending through the serosa into the mesentery. Above this narrowing there is generalized gas and fluid dilated bowel. Vascular/Lymphatic: No acute vascular finding. Decrease in size of IMA lymph nodes. Reproductive:No pathologic findings. Other: No ascites or pneumoperitoneum. Musculoskeletal: No acute abnormalities. IMPRESSION: 1. 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 2. Multifocal hepatic metastatic disease which is stable or slightly improved from  February 2021. Electronically Signed   By: Monte Fantasia M.D.   On: 07/10/2019 11:32   DG Abd 2 Views  Result Date: 07/13/2019 CLINICAL DATA:  Status post colonic stent placement EXAM: ABDOMEN - 2 VIEW COMPARISON:  July 10, 2019 FINDINGS: There is an apparent stent in the sigmoid colon region overlying the sacrum. There is no appreciable bowel dilatation or air-fluid level to suggest bowel obstruction. No free air. Nasogastric tube tip and side port in stomach. There are surgical clips in the upper abdomen. Lung bases are clear. IMPRESSION: Apparent stent in the sigmoid colon region overlying the sacrum. Nasogastric tube tip and side port in stomach. No bowel obstruction or free air. Lung bases clear. Electronically Signed   By: Lowella Grip III M.D.  On: 07/13/2019 11:44   DG C-Arm 1-60 Min-No Report  Result Date: 07/12/2019 Fluoroscopy was utilized by the requesting physician.  No radiographic interpretation.   VAS [place] LOWER EXTREMITY VENOUS (DVT)  Result Date: 07/27/2019  Lower Venous DVTStudy Indications: Swelling.  Risk Factors: None identified. Limitations: Poor ultrasound/tissue interface. Comparison Study: No prior studies. Performing Technologist: Oliver Hum RVT  Examination Guidelines: A complete evaluation includes B-mode imaging, spectral Doppler, color Doppler, and power Doppler as needed of all accessible portions of each vessel. Bilateral testing is considered an integral part of a complete examination. Limited examinations for reoccurring indications may be performed as noted. The reflux portion of the exam is performed with the patient in reverse Trendelenburg.  +---------+---------------+---------+-----------+----------+--------------+ RIGHT    CompressibilityPhasicitySpontaneityPropertiesThrombus Aging +---------+---------------+---------+-----------+----------+--------------+ CFV      Full           Yes      Yes                                  +---------+---------------+---------+-----------+----------+--------------+ SFJ      Full                                                        +---------+---------------+---------+-----------+----------+--------------+ FV Prox  Full                                                        +---------+---------------+---------+-----------+----------+--------------+ FV Mid   Full                                                        +---------+---------------+---------+-----------+----------+--------------+ FV DistalFull                                                        +---------+---------------+---------+-----------+----------+--------------+ PFV      Full                                                        +---------+---------------+---------+-----------+----------+--------------+ POP      Full           Yes      Yes                                 +---------+---------------+---------+-----------+----------+--------------+ PTV      Full                                                        +---------+---------------+---------+-----------+----------+--------------+  PERO     Full                                                        +---------+---------------+---------+-----------+----------+--------------+   +----+---------------+---------+-----------+----------+--------------+ LEFTCompressibilityPhasicitySpontaneityPropertiesThrombus Aging +----+---------------+---------+-----------+----------+--------------+ CFV Full           Yes      Yes                                 +----+---------------+---------+-----------+----------+--------------+     Summary: RIGHT: - There is no evidence of deep vein thrombosis in the lower extremity.  - No cystic structure found in the popliteal fossa.  LEFT: - No evidence of common femoral vein obstruction.  *See table(s) above for measurements and observations. Electronically signed by Deitra Mayo MD on 07/27/2019 at 6:22:06 AM.    Final    ECHOCARDIOGRAM LIMITED  Result Date: 07/17/2019    ECHOCARDIOGRAM LIMITED REPORT   Patient Name:   Marcas Bowsher. Date of Exam: 07/17/2019 Medical Rec #:  [id number]          Height:       74.0 in Accession #:    [id number]         Weight:       218.9 lb Date of Birth:  March 03, 1950           BSA:          2.259 m Patient Age:    30 years           BP:           117/54 mmHg Patient Gender: M                  HR:           98 bpm. Exam Location:  Inpatient Procedure: Limited Echo, Intracardiac Opacification Agent, Color Doppler and            Cardiac Doppler Indications:    Congestive Heart Failure 428.0 / I50.9  History:        Patient has prior history of Echocardiogram examinations, most                 recent 04/28/2019. CAD, Prior CABG; Risk Factors:Diabetes.                 Malignant neoplasm of sigmoid colon, history of throat cancer.  Sonographer:    Darlina Sicilian RDCS Referring Phys: [id number] Nadean Corwin A [place]  1. Left ventricular ejection fraction, by estimation, is 55-60%. The left ventricle has normal function. Left ventricular endocardial border not optimally defined to evaluate regional wall motion even with definity contrast. There is severe left ventricular hypertrophy of the basal-septal segment. Left ventricular diastolic parameters are indeterminate.  2. Right ventricular systolic function was not well visualized. The right ventricular size is normal. There is normal pulmonary artery systolic pressure. The estimated right ventricular systolic pressure is 31.5 mmHg.  3. The mitral valve is normal in structure. No evidence of mitral valve regurgitation. No evidence of mitral stenosis.  4. The aortic valve is tricuspid. Aortic valve regurgitation is trivial. Mild aortic valve sclerosis is present, with no evidence of aortic valve stenosis.  5. Aortic dilatation noted. There is mild dilatation at the level  of the sinuses of Valsalva  measuring 43 mm.  6. The inferior vena cava is dilated in size with <50% respiratory variability, suggesting right atrial pressure of 15 mmHg. FINDINGS  Left Ventricle: Left ventricular ejection fraction, by estimation, is 55 to 60%. The left ventricle has normal function. Left ventricular endocardial border not optimally defined to evaluate regional wall motion. The left ventricular internal cavity size was normal in size. There is severe left ventricular hypertrophy of the basal-septal segment. Right Ventricle: The right ventricular size is normal. No increase in right ventricular wall thickness. Right ventricular systolic function was not well visualized. There is normal pulmonary artery systolic pressure. The tricuspid regurgitant velocity is  1.36 m/s, and with an assumed right atrial pressure of 15 mmHg, the estimated right ventricular systolic pressure is 25.9 mmHg. Left Atrium: Left atrial size was normal in size. Right Atrium: Right atrial size was normal in size. Pericardium: There is no evidence of pericardial effusion. Mitral Valve: The mitral valve is normal in structure. Normal mobility of the mitral valve leaflets. No evidence of mitral valve stenosis. Tricuspid Valve: The tricuspid valve is normal in structure. Tricuspid valve regurgitation is trivial. No evidence of tricuspid stenosis. Aortic Valve: The aortic valve is tricuspid. Aortic valve regurgitation is trivial. Mild aortic valve sclerosis is present, with no evidence of aortic valve stenosis. Pulmonic Valve: The pulmonic valve was normal in structure. Pulmonic valve regurgitation is not visualized. No evidence of pulmonic stenosis. Aorta: Aortic dilatation noted. There is mild dilatation at the level of the sinuses of Valsalva measuring 43 mm. Venous: The inferior vena cava is dilated in size with less than 50% respiratory variability, suggesting right atrial pressure of 15 mmHg. IAS/Shunts: No atrial level shunt detected by color flow  Doppler.  LEFT VENTRICLE PLAX 2D LVIDd:         4.30 cm LVIDs:         3.55 cm LV PW:         1.05 cm LV IVS:        1.80 cm LVOT diam:     2.20 cm LV SV:         48 LV SV Index:   21 LVOT Area:     3.80 cm  LV Volumes (MOD) LV vol d, MOD A2C: 82.9 ml LV vol d, MOD A4C: 173.0 ml LV vol s, MOD A2C: 56.7 ml LV vol s, MOD A4C: 88.6 ml LV SV MOD A2C:     26.2 ml LV SV MOD A4C:     173.0 ml LV SV MOD BP:      51.9 ml LEFT ATRIUM         Index LA diam:    2.40 cm 1.06 cm/m  AORTIC VALVE LVOT Vmax:   75.00 cm/s LVOT Vmean:  57.000 cm/s LVOT VTI:    0.126 m  AORTA Ao Root diam: 4.30 cm MITRAL VALVE               TRICUSPID VALVE MV Area (PHT): 4.96 cm    TR Peak grad:   7.4 mmHg MV Decel Time: 153 msec    TR Vmax:        136.00 cm/s MV E velocity: 95.20 cm/s                            SHUNTS  Systemic VTI:  0.13 m                            Systemic Diam: 2.20 cm Fransico Him MD Electronically signed by Fransico Him MD Signature Date/Time: 07/17/2019/10:43:14 AM    Final      The results of significant diagnostics from this hospitalization (including imaging, microbiology, ancillary and laboratory) are listed below for reference.     Microbiology: Recent Results (from the past 240 hour(s))  C Difficile Quick Screen (NO PCR Reflex)     Status: None   Collection Time: 07/23/19  4:06 PM   Specimen: STOOL  Result Value Ref Range Status   C Diff antigen NEGATIVE NEGATIVE Final   C Diff toxin NEGATIVE NEGATIVE Final   C Diff interpretation No C. difficile detected.  Final    Comment: Performed at [facility], [place] [address] [place], [place] [address]  Culture, Urine     Status: Abnormal   Collection Time: 07/25/19 12:13 PM   Specimen: Urine, Random  Result Value Ref Range Status   Specimen Description   Final    URINE, RANDOM Performed at [facility] [address] [place],  [address]    Special Requests   Final     NONE Performed at [facility], [place] [address] [place], [place] [address]    Culture 30,000 COLONIES/mL PROTEUS VULGARIS (A)  Final   Report Status 07/28/2019 FINAL  Final   Organism ID, Bacteria PROTEUS VULGARIS (A)  Final      Susceptibility   Proteus vulgaris - MIC*    AMPICILLIN >=32 RESISTANT Resistant     CEFAZOLIN >=64 RESISTANT Resistant     CIPROFLOXACIN 0.5 SENSITIVE Sensitive     GENTAMICIN <=1 SENSITIVE Sensitive     IMIPENEM 2 SENSITIVE Sensitive     NITROFURANTOIN 128 RESISTANT Resistant     TRIMETH/SULFA <=20 SENSITIVE Sensitive     AMPICILLIN/SULBACTAM 16 INTERMEDIATE Intermediate     PIP/TAZO <=4 SENSITIVE Sensitive     * 30,000 COLONIES/mL PROTEUS VULGARIS  SARS Coronavirus 2 by RT PCR (hospital order, performed in [facility] hospital lab) Nasopharyngeal Nasopharyngeal Swab     Status: None   Collection Time: 07/25/19  2:29 PM   Specimen: Nasopharyngeal Swab  Result Value Ref Range Status   SARS Coronavirus 2 NEGATIVE NEGATIVE Final    Comment: (NOTE) SARS-CoV-2 target nucleic acids are NOT DETECTED.  The SARS-CoV-2 RNA is generally detectable in upper and lower respiratory specimens during the acute phase of infection. The lowest concentration of SARS-CoV-2 viral copies this assay can detect is 250 copies / mL. A negative result does not preclude SARS-CoV-2 infection and should not be used as the sole basis for treatment or other patient management decisions.  A negative result may occur with improper specimen collection / handling, submission of specimen other than nasopharyngeal swab, presence of viral mutation(s) within the areas targeted by this assay, and inadequate number of viral copies (<250 copies / mL). A negative result must be combined with clinical observations, patient history, and epidemiological information.  Fact Sheet for Patients:   StrictlyIdeas.no  Fact Sheet for Healthcare  Providers: BankingDealers.co.za  This test is not yet approved or  cleared by the [place] FDA and has been authorized for detection and/or diagnosis of SARS-CoV-2 by FDA under an Emergency Use Authorization (EUA).  This EUA will remain in effect (meaning this test can  be used) for the duration of the COVID-19 declaration under Section 564(b)(1) of the Act, 21 U.S.C. section 360bbb-3(b)(1), unless the authorization is terminated or revoked sooner.  Performed at [facility], [place] [address] [place], [place] [address]   SARS CORONAVIRUS 2 (TAT 6-24 HRS) Nasopharyngeal Nasopharyngeal Swab     Status: None   Collection Time: 07/28/19  9:42 AM   Specimen: Nasopharyngeal Swab  Result Value Ref Range Status   SARS Coronavirus 2 NEGATIVE NEGATIVE Final    Comment: (NOTE) SARS-CoV-2 target nucleic acids are NOT DETECTED.  The SARS-CoV-2 RNA is generally detectable in upper and lower respiratory specimens during the acute phase of infection. Negative results do not preclude SARS-CoV-2 infection, do not rule out co-infections with other pathogens, and should not be used as the sole basis for treatment or other patient management decisions. Negative results must be combined with clinical observations, patient history, and epidemiological information. The expected result is Negative.  Fact Sheet for Patients: SugarRoll.be  Fact Sheet for Healthcare Providers: https://www.woods-mathews.com/  This test is not yet approved or cleared by the [place] FDA and  has been authorized for detection and/or diagnosis of SARS-CoV-2 by FDA under an Emergency Use Authorization (EUA). This EUA will remain  in effect (meaning this test can be used) for the duration of the COVID-19 declaration under Se ction 564(b)(1) of the Act, 21 U.S.C. section 360bbb-3(b)(1), unless the authorization is terminated or revoked  sooner.  Performed at [facility], [place] [address], [place], [place] [address]      Labs: BNP (last 3 results) Recent Labs    04/28/19 0134  BNP 884.1*   Basic Metabolic Panel: Recent Labs  Lab 07/27/19 0401 07/28/19 0405 07/29/19 0405 07/30/19 0427 07/31/19 0254  NA 134* 137 139 136 138  K 3.5 4.4 4.0 3.9 3.9  CL 101 102 102 99 100  CO2 27 27 29 29 29   GLUCOSE 105* 109* 86 85 82  BUN 21 18 16 17 15   CREATININE 0.94 1.01 0.83 1.03 0.95  CALCIUM 7.7* 8.0* 8.5* 8.3* 8.5*  MG 1.4* 1.6* 1.9 1.7 1.6*   Liver Function Tests: No results for input(s): AST, ALT, ALKPHOS, BILITOT, PROT, ALBUMIN in the last 168 hours. No results for input(s): LIPASE, AMYLASE in the last 168 hours. No results for input(s): AMMONIA in the last 168 hours. CBC: Recent Labs  Lab 07/27/19 0401 07/28/19 0405 07/29/19 0405 07/30/19 0427 07/31/19 0254  WBC 7.8 7.4 6.1 6.7 6.8  HGB 8.3* 7.9* 8.2* 8.2* 8.8*  HCT 26.3* 25.7* 26.9* 26.9* 28.9*  MCV 91.0 92.4 93.4 91.8 92.0  PLT 269 290 275 267 253   Cardiac Enzymes: No results for input(s): CKTOTAL, CKMB, CKMBINDEX, TROPONINI in the last 168 hours. BNP: Invalid input(s): POCBNP CBG: Recent Labs  Lab 07/31/19 2243 08/01/19 0724 08/01/19 1206 08/01/19 1711 08/02/19 0800  GLUCAP 297* 338* 279* 302* 171*   D-Dimer No results for input(s): DDIMER in the last 72 hours. Hgb A1c No results for input(s): HGBA1C in the last 72 hours. Lipid Profile No results for input(s): CHOL, HDL, LDLCALC, TRIG, CHOLHDL, LDLDIRECT in the last 72 hours. Thyroid function studies No results for input(s): TSH, T4TOTAL, T3FREE, THYROIDAB in the last 72 hours.  Invalid input(s): FREET3 Anemia work up No results for input(s): VITAMINB12, FOLATE, FERRITIN, TIBC, IRON, RETICCTPCT in the last 72 hours. Urinalysis    Component Value Date/Time   COLORURINE YELLOW 07/25/2019 1542   APPEARANCEUR HAZY (A) 07/25/2019  1542   LABSPEC 1.018 07/25/2019 1542    PHURINE 5.0 07/25/2019 1542   GLUCOSEU 50 (A) 07/25/2019 1542   HGBUR SMALL (A) 07/25/2019 1542   BILIRUBINUR NEGATIVE 07/25/2019 1542   KETONESUR 5 (A) 07/25/2019 1542   PROTEINUR NEGATIVE 07/25/2019 1542   NITRITE NEGATIVE 07/25/2019 1542   LEUKOCYTESUR LARGE (A) 07/25/2019 1542   Sepsis Labs Invalid input(s): PROCALCITONIN,  WBC,  LACTICIDVEN Microbiology Recent Results (from the past 240 hour(s))  C Difficile Quick Screen (NO PCR Reflex)     Status: None   Collection Time: 07/23/19  4:06 PM   Specimen: STOOL  Result Value Ref Range Status   C Diff antigen NEGATIVE NEGATIVE Final   C Diff toxin NEGATIVE NEGATIVE Final   C Diff interpretation No C. difficile detected.  Final    Comment: Performed at [facility], [place] [address] [place], [place] [address]  Culture, Urine     Status: Abnormal   Collection Time: 07/25/19 12:13 PM   Specimen: Urine, Random  Result Value Ref Range Status   Specimen Description   Final    URINE, RANDOM Performed at [place] [address], [place], [place] [address]    Special Requests   Final    NONE Performed at [facility], [place] [address] [place], [place] [address]    Culture 30,000 COLONIES/mL PROTEUS VULGARIS (A)  Final   Report Status 07/28/2019 FINAL  Final   Organism ID, Bacteria PROTEUS VULGARIS (A)  Final      Susceptibility   Proteus vulgaris - MIC*    AMPICILLIN >=32 RESISTANT Resistant     CEFAZOLIN >=64 RESISTANT Resistant     CIPROFLOXACIN 0.5 SENSITIVE Sensitive     GENTAMICIN <=1 SENSITIVE Sensitive     IMIPENEM 2 SENSITIVE Sensitive     NITROFURANTOIN 128 RESISTANT Resistant     TRIMETH/SULFA <=20 SENSITIVE Sensitive     AMPICILLIN/SULBACTAM 16 INTERMEDIATE Intermediate     PIP/TAZO <=4 SENSITIVE Sensitive     * 30,000 COLONIES/mL PROTEUS VULGARIS  SARS Coronavirus 2 by RT PCR (hospital order, performed in [facility] hospital lab) Nasopharyngeal  Nasopharyngeal Swab     Status: None   Collection Time: 07/25/19  2:29 PM   Specimen: Nasopharyngeal Swab  Result Value Ref Range Status   SARS Coronavirus 2 NEGATIVE NEGATIVE Final    Comment: (NOTE) SARS-CoV-2 target nucleic acids are NOT DETECTED.  The SARS-CoV-2 RNA is generally detectable in upper and lower respiratory specimens during the acute phase of infection. The lowest concentration of SARS-CoV-2 viral copies this assay can detect is 250 copies / mL. A negative result does not preclude SARS-CoV-2 infection and should not be used as the sole basis for treatment or other patient management decisions.  A negative result may occur with improper specimen collection / handling, submission of specimen other than nasopharyngeal swab, presence of viral mutation(s) within the areas targeted by this assay, and inadequate number of viral copies (<250 copies / mL). A negative result must be combined with clinical observations, patient history, and epidemiological information.  Fact Sheet for Patients:   StrictlyIdeas.no  Fact Sheet for Healthcare Providers: BankingDealers.co.za  This test is not yet approved or  cleared by the [place] FDA and has been authorized for detection and/or diagnosis of SARS-CoV-2 by FDA under an Emergency Use Authorization (EUA).  This EUA will remain in effect (meaning this test can be used) for the duration of the COVID-19 declaration under Section  564(b)(1) of the Act, 21 U.S.C. section 360bbb-3(b)(1), unless the authorization is terminated or revoked sooner.  Performed at [facility], [place] [address], [place], [place] [address]   SARS CORONAVIRUS 2 (TAT 6-24 HRS) Nasopharyngeal Nasopharyngeal Swab     Status: None   Collection Time: 07/28/19  9:42 AM   Specimen: Nasopharyngeal Swab  Result Value Ref Range Status   SARS Coronavirus 2 NEGATIVE NEGATIVE Final    Comment:  (NOTE) SARS-CoV-2 target nucleic acids are NOT DETECTED.  The SARS-CoV-2 RNA is generally detectable in upper and lower respiratory specimens during the acute phase of infection. Negative results do not preclude SARS-CoV-2 infection, do not rule out co-infections with other pathogens, and should not be used as the sole basis for treatment or other patient management decisions. Negative results must be combined with clinical observations, patient history, and epidemiological information. The expected result is Negative.  Fact Sheet for Patients: SugarRoll.be  Fact Sheet for Healthcare Providers: https://www.woods-mathews.com/  This test is not yet approved or cleared by the [place] FDA and  has been authorized for detection and/or diagnosis of SARS-CoV-2 by FDA under an Emergency Use Authorization (EUA). This EUA will remain  in effect (meaning this test can be used) for the duration of the COVID-19 declaration under Se ction 564(b)(1) of the Act, 21 U.S.C. section 360bbb-3(b)(1), unless the authorization is terminated or revoked sooner.  Performed at [facility], [place] [address], [place], [place] [address]      Time coordinating discharge:  I have spent 35 minutes face to face with the patient and on the ward discussing the patients care, assessment, plan and disposition with other care givers. >50% of the time was devoted counseling the patient about the risks and benefits of treatment/Discharge disposition and coordinating care.   SIGNED:   Shelly Coss, MD  [facility] 08/02/2019, 11:16 AM   If 7PM-7AM, please contact night-coverage

## 2019-07-29 NOTE — Progress Notes (Addendum)
HEMATOLOGY-ONCOLOGY PROGRESS NOTE  SUBJECTIVE: Awaiting discharge to SNF.  Awaiting bed offer.  Remains on Ancef for cellulitis.  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[place])   04/11/2019 -  Chemotherapy   The patient had palonosetron (ALOXI) injection 0.25 mg, 0.25 mg, Intravenous,  Once, 5 of 6 cycles Administration: 0.25 mg (04/11/2019), 0.25 mg (05/28/2019), 0.25 mg (06/11/2019), 0.25 mg (06/25/2019) pegfilgrastim-cbqv (UDENYCA) injection 6 mg, 6 mg, Subcutaneous, Once, 4 of 5 cycles Administration: 6 mg (05/30/2019), 6 mg (06/13/2019), 6 mg (06/27/2019) leucovorin 972 mg in dextrose 5 % 250 mL infusion, 400 mg/m2 = 972 mg, Intravenous,  Once, 5 of 6 cycles Administration: 972 mg (04/11/2019), 972 mg (05/28/2019), 972 mg (06/11/2019), 972 mg (06/25/2019) oxaliplatin (ELOXATIN) 200 mg in dextrose 5 % 500 mL chemo infusion, 83 mg/m2 = 205 mg, Intravenous,  Once, 5 of 6 cycles Administration: 200 mg (04/11/2019), 200 mg (05/28/2019), 200 mg (06/11/2019), 200 mg (06/25/2019) fluorouracil (ADRUCIL) chemo injection 950 mg, 400 mg/m2 = 950 mg, Intravenous,  Once, 1 of 1 cycle Administration: 950 mg (04/11/2019) fluorouracil (ADRUCIL) 5,850 mg in sodium chloride 0.9 % 133 mL chemo infusion, 2,400 mg/m2 = 5,850 mg, Intravenous, 1 Day/Dose, 5 of 6 cycles Dose modification: 2,000 mg/m2 (original dose 2,400 mg/m2, Cycle 2, Reason: Provider Judgment) Administration: 5,850 mg (04/11/2019), 4,850 mg (05/28/2019), 4,850 mg (06/11/2019), 4,850 mg (06/25/2019)  for chemotherapy treatment.    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[place])   04/11/2019 -  Chemotherapy   The patient had palonosetron (ALOXI) injection 0.25 mg, 0.25 mg, Intravenous,  Once, 5 of 6 cycles Administration: 0.25 mg (04/11/2019), 0.25 mg (05/28/2019), 0.25 mg (06/11/2019), 0.25 mg (06/25/2019) pegfilgrastim-cbqv (UDENYCA)  injection 6 mg, 6 mg, Subcutaneous, Once, 4 of 5 cycles Administration: 6 mg (05/30/2019), 6 mg (06/13/2019), 6 mg (06/27/2019) leucovorin 972 mg in dextrose 5 % 250 mL infusion, 400 mg/m2 = 972 mg, Intravenous,  Once, 5 of 6 cycles Administration: 972 mg (04/11/2019), 972 mg (05/28/2019), 972 mg (06/11/2019), 972 mg (06/25/2019) oxaliplatin (ELOXATIN) 200 mg in dextrose 5 % 500 mL chemo infusion, 83 mg/m2 = 205 mg, Intravenous,  Once, 5 of 6 cycles Administration: 200 mg (04/11/2019), 200 mg (05/28/2019), 200 mg (06/11/2019), 200 mg (06/25/2019) fluorouracil (ADRUCIL) chemo injection 950 mg, 400 mg/m2 = 950 mg, Intravenous,  Once, 1 of 1 cycle Administration: 950 mg (04/11/2019) fluorouracil (ADRUCIL) 5,850 mg in sodium chloride 0.9 % 133 mL chemo infusion, 2,400 mg/m2 = 5,850 mg, Intravenous, 1 Day/Dose, 5 of 6 cycles Dose modification: 2,000 mg/m2 (original dose 2,400 mg/m2, Cycle 2, Reason: Provider Judgment) Administration: 5,850 mg (04/11/2019), 4,850 mg (05/28/2019), 4,850 mg (06/11/2019), 4,850 mg (06/25/2019)  for chemotherapy treatment.      PHYSICAL EXAMINATION:  Vitals:   07/29/19 0520 07/29/19 0936  BP: 135/82 114/65  Pulse: (!) 56 64  Resp: 20   Temp: 97.6 F (36.4 C)   SpO2: 98% 100%   Filed Weights   07/27/19 0531 07/28/19 0603 07/29/19 0520  Weight: 104.3 kg 102.1 kg 103.9 kg    Intake/Output from previous day: 06/27 0701 - 06/28 0700 In: 705 [P.O.:205; IV Piggyback:500] Out: 3100 [Urine:3100]  GENERAL:alert, no distress and comfortable HEENT: No thrush or ulcers LUNGS: Clear anteriorly HEART: Regular rhythm ABDOMEN:abdomen soft, non-tender, no hepatomegaly NEURO: alert & oriented x 3 with fluent speech, limited movement of the legs-pain related? Skin: Abrasions at  the right lower leg, blisters at the lower leg bilaterally  LABORATORY DATA:  I have reviewed the data as listed CMP Latest Ref Rng & Units 07/29/2019 07/28/2019 07/27/2019  Glucose 70 - 99 mg/dL 86 109(H)  105(H)  BUN 8 - 23 mg/dL 16 18 21   Creatinine 0.61 - 1.24 mg/dL 0.83 1.01 0.94  Sodium 135 - 145 mmol/L 139 137 134(L)  Potassium 3.5 - 5.1 mmol/L 4.0 4.4 3.5  Chloride 98 - 111 mmol/L 102 102 101  CO2 22 - 32 mmol/L 29 27 27   Calcium 8.9 - 10.3 mg/dL 8.5(L) 8.0(L) 7.7(L)  Total Protein 6.5 - 8.1 g/dL - - -  Total Bilirubin 0.3 - 1.2 mg/dL - - -  Alkaline Phos 38 - 126 U/L - - -  AST 15 - 41 U/L - - -  ALT 0 - 44 U/L - - -    Lab Results  Component Value Date   WBC 6.1 07/29/2019   HGB 8.2 (L) 07/29/2019   HCT 26.9 (L) 07/29/2019   MCV 93.4 07/29/2019   PLT 275 07/29/2019   NEUTROABS 13.8 (H) 07/25/2019    DG Chest 1 View  Result Date: 07/09/2019 CLINICAL DATA:  Tachycardia and diffuse abdominal pain. EXAM: CHEST  1 VIEW COMPARISON:  04/28/2019 FINDINGS: Previous median sternotomy. Power port inserted from a right jugular approach with tip at the SVC RA junction. Heart size is upper limits of normal with some left ventricular prominence. No acute mediastinal finding. The pulmonary vascularity is normal. The lungs are clear. No infiltrate, collapse or effusion. IMPRESSION: No active disease. Electronically Signed   By: Nelson Chimes M.D.   On: 07/09/2019 14:21   DG Lumbar Spine Complete  Result Date: 07/25/2019 CLINICAL DATA:  Low back pain after fall. EXAM: LUMBAR SPINE - COMPLETE 4+ VIEW COMPARISON:  07/13/2019. FINDINGS: Diffuse multilevel degenerative change. No acute bony abnormality identified. No evidence of fracture. Rectosigmoid stent noted. IMPRESSION: 1. Diffuse multilevel degenerative change. No acute bony abnormality. 2.  Rectosigmoid stent again noted. Electronically Signed   By: Marcello Moores  Register   On: 07/25/2019 13:14   DG Tibia/Fibula Right  Result Date: 07/25/2019 CLINICAL DATA:  Fall right lower leg injury EXAM: RIGHT TIBIA AND FIBULA - 2 VIEW COMPARISON:  None. FINDINGS: Tiny ossific linear density is seen adjacent to the lateral femoral condyle which could  represent a tiny chip fracture. Fragmented enthesophytes seen at the anterior tibial tubercle. Mild overlying soft tissue swelling seen around the lateral aspect of the knee. IMPRESSION: Tiny linear density seen adjacent to the lateral femoral condyle with overlying soft tissue swelling which could represent a tiny chip fracture. Please correlate with the patient's site of pain. Electronically Signed   By: Prudencio Pair M.D.   On: 07/25/2019 11:28   DG Ankle Complete Right  Result Date: 07/25/2019 CLINICAL DATA:  Patient had a mechanical fall last night around 10pm, tripped over a canister, states he is weak because the hospital would not let him walk. Reports R knee and R ankle pain. Weeping and skin tears noted to most of right tib/fib .*comment was truncated*fell, R lower leg injury EXAM: RIGHT ANKLE - COMPLETE 3+ VIEW COMPARISON:  None. FINDINGS: Ankle mortise intact. The talar dome is normal. No malleolar fracture. The calcaneus is normal. Enthesopathic spurring of the calcaneus. IMPRESSION: No fracture or dislocation. Electronically Signed   By: Suzy Bouchard M.D.   On: 07/25/2019 11:31   DG Abd 1 View  Result Date: 07/10/2019 CLINICAL DATA:  Nasogastric tube placement. EXAM: ABDOMEN - 1 VIEW COMPARISON:  July 09, 2019 (1:53 p.m.) FINDINGS: Multiple sternal wires and sternal fixation plates and screws are seen. A nasogastric tube is seen with its distal tip overlying the body of the stomach. Numerous dilated small bowel loops are again seen throughout the abdomen and pelvis. No radio-opaque calculi or other significant radiographic abnormality are seen. IMPRESSION: Nasogastric tube positioning, as described above. Electronically Signed   By: Virgina Norfolk M.D.   On: 07/10/2019 17:56   DG Abdomen 1 View  Result Date: 07/09/2019 CLINICAL DATA:  Tachycardia.  Diffuse abdominal pain. EXAM: ABDOMEN - 1 VIEW COMPARISON:  None. FINDINGS: There is gas throughout small and large bowel most consistent  with ileus. No focal dilatation to suggest obstruction. No free air. No abnormal calcifications. Ordinary degenerative changes affect the spine. IMPRESSION: Possible ileus pattern. No evidence of frank obstruction or focal lesion. Electronically Signed   By: Nelson Chimes M.D.   On: 07/09/2019 14:22   CT ABDOMEN PELVIS W CONTRAST  Result Date: 07/10/2019 CLINICAL DATA:  Rectal tumor. Abdominal distention. No bowel movement for days. EXAM: CT ABDOMEN AND PELVIS WITH CONTRAST TECHNIQUE: Multidetector CT imaging of the abdomen and pelvis was performed using the standard protocol following bolus administration of intravenous contrast. CONTRAST:  119m OMNIPAQUE IOHEXOL 300 MG/ML  SOLN COMPARISON:  03/15/2019 FINDINGS: Lower chest: Atelectasis and patchy airspace opacity in the lower lungs. Atherosclerosis and postoperative heart. Hepatobiliary: Multifocal hepatic metastatic disease with ill-defined masses measuring up to 4 cm in the posterior right liver, roughly 5 mm smaller than the before. Evidence of biliary obstruction or stone. Pancreas: Diffuse fatty atrophy with some preservation of density at the tail. There could be a cyst at the uncinate process, incidental in this setting. Spleen: Unremarkable. Adrenals/Urinary Tract: Negative adrenals. No hydronephrosis or stone. Simple bilateral renal cysts. Unremarkable bladder. Stomach/Bowel: Known sigmoid mass with severe luminal stenosis. The adjacent wall thickening is less thickened, but there is still spiculated density extending through the serosa into the mesentery. Above this narrowing there is generalized gas and fluid dilated bowel. Vascular/Lymphatic: No acute vascular finding. Decrease in size of IMA lymph nodes. Reproductive:No pathologic findings. Other: No ascites or pneumoperitoneum. Musculoskeletal: No acute abnormalities. IMPRESSION: 1. Severe and obstructing malignant stricture at the sigmoid colon where there is still extra serosal spiculated  density extending into the mesentery. 2. Multifocal hepatic metastatic disease which is stable or slightly improved from February 2021. Electronically Signed   By: JMonte FantasiaM.D.   On: 07/10/2019 11:32   DG Abd 2 Views  Result Date: 07/13/2019 CLINICAL DATA:  Status post colonic stent placement EXAM: ABDOMEN - 2 VIEW COMPARISON:  July 10, 2019 FINDINGS: There is an apparent stent in the sigmoid colon region overlying the sacrum. There is no appreciable bowel dilatation or air-fluid level to suggest bowel obstruction. No free air. Nasogastric tube tip and side port in stomach. There are surgical clips in the upper abdomen. Lung bases are clear. IMPRESSION: Apparent stent in the sigmoid colon region overlying the sacrum. Nasogastric tube tip and side port in stomach. No bowel obstruction or free air. Lung bases clear. Electronically Signed   By: WLowella GripIII M.D.   On: 07/13/2019 11:44   DG C-Arm 1-60 Min-No Report  Result Date: 07/12/2019 Fluoroscopy was utilized by the requesting physician.  No radiographic interpretation.   VAS [place]OWER EXTREMITY VENOUS (DVT)  Result Date: 07/27/2019  Lower Venous DVTStudy Indications: Swelling.  Risk Factors:  None identified. Limitations: Poor ultrasound/tissue interface. Comparison Study: No prior studies. Performing Technologist: Oliver Hum RVT  Examination Guidelines: A complete evaluation includes B-mode imaging, spectral Doppler, color Doppler, and power Doppler as needed of all accessible portions of each vessel. Bilateral testing is considered an integral part of a complete examination. Limited examinations for reoccurring indications may be performed as noted. The reflux portion of the exam is performed with the patient in reverse Trendelenburg.  +---------+---------------+---------+-----------+----------+--------------+ RIGHT    CompressibilityPhasicitySpontaneityPropertiesThrombus Aging  +---------+---------------+---------+-----------+----------+--------------+ CFV      Full           Yes      Yes                                 +---------+---------------+---------+-----------+----------+--------------+ SFJ      Full                                                        +---------+---------------+---------+-----------+----------+--------------+ FV Prox  Full                                                        +---------+---------------+---------+-----------+----------+--------------+ FV Mid   Full                                                        +---------+---------------+---------+-----------+----------+--------------+ FV DistalFull                                                        +---------+---------------+---------+-----------+----------+--------------+ PFV      Full                                                        +---------+---------------+---------+-----------+----------+--------------+ POP      Full           Yes      Yes                                 +---------+---------------+---------+-----------+----------+--------------+ PTV      Full                                                        +---------+---------------+---------+-----------+----------+--------------+ PERO     Full                                                        +---------+---------------+---------+-----------+----------+--------------+   +----+---------------+---------+-----------+----------+--------------+  LEFTCompressibilityPhasicitySpontaneityPropertiesThrombus Aging +----+---------------+---------+-----------+----------+--------------+ CFV Full           Yes      Yes                                 +----+---------------+---------+-----------+----------+--------------+     Summary: RIGHT: - There is no evidence of deep vein thrombosis in the lower extremity.  - No cystic structure found in the popliteal fossa.   LEFT: - No evidence of common femoral vein obstruction.  *See table(s) above for measurements and observations. Electronically signed by Deitra Mayo MD on 07/27/2019 at 6:22:06 AM.    Final    ECHOCARDIOGRAM LIMITED  Result Date: 07/17/2019    ECHOCARDIOGRAM LIMITED REPORT   Patient Name:   Clarence Andrews. Date of Exam: 07/17/2019 Medical Rec #:  [id number]          Height:       74.0 in Accession #:    [id number]         Weight:       218.9 lb Date of Birth:  [birth year]           BSA:          2.259 m Patient Age:    69 years           BP:           117/54 mmHg Patient Gender: M                  HR:           98 bpm. Exam Location:  Inpatient Procedure: Limited Echo, Intracardiac Opacification Agent, Color Doppler and            Cardiac Doppler Indications:    Congestive Heart Failure 428.0 / I50.9  History:        Patient has prior history of Echocardiogram examinations, most                 recent 04/28/2019. CAD, Prior CABG; Risk Factors:Diabetes.                 Malignant neoplasm of sigmoid colon, history of throat cancer.  Sonographer:    Darlina Sicilian RDCS Referring Phys: [id number] Nadean Corwin A [place]  1. Left ventricular ejection fraction, by estimation, is 55-60%. The left ventricle has normal function. Left ventricular endocardial border not optimally defined to evaluate regional wall motion even with definity contrast. There is severe left ventricular hypertrophy of the basal-septal segment. Left ventricular diastolic parameters are indeterminate.  2. Right ventricular systolic function was not well visualized. The right ventricular size is normal. There is normal pulmonary artery systolic pressure. The estimated right ventricular systolic pressure is 88.8 mmHg.  3. The mitral valve is normal in structure. No evidence of mitral valve regurgitation. No evidence of mitral stenosis.  4. The aortic valve is tricuspid. Aortic valve regurgitation is trivial. Mild aortic valve sclerosis is  present, with no evidence of aortic valve stenosis.  5. Aortic dilatation noted. There is mild dilatation at the level of the sinuses of Valsalva measuring 43 mm.  6. The inferior vena cava is dilated in size with <50% respiratory variability, suggesting right atrial pressure of 15 mmHg. FINDINGS  Left Ventricle: Left ventricular ejection fraction, by estimation, is 55 to 60%. The left ventricle has normal function. Left ventricular endocardial border not optimally defined to evaluate regional wall motion. The left ventricular  internal cavity size was normal in size. There is severe left ventricular hypertrophy of the basal-septal segment. Right Ventricle: The right ventricular size is normal. No increase in right ventricular wall thickness. Right ventricular systolic function was not well visualized. There is normal pulmonary artery systolic pressure. The tricuspid regurgitant velocity is  1.36 m/s, and with an assumed right atrial pressure of 15 mmHg, the estimated right ventricular systolic pressure is 74.2 mmHg. Left Atrium: Left atrial size was normal in size. Right Atrium: Right atrial size was normal in size. Pericardium: There is no evidence of pericardial effusion. Mitral Valve: The mitral valve is normal in structure. Normal mobility of the mitral valve leaflets. No evidence of mitral valve stenosis. Tricuspid Valve: The tricuspid valve is normal in structure. Tricuspid valve regurgitation is trivial. No evidence of tricuspid stenosis. Aortic Valve: The aortic valve is tricuspid. Aortic valve regurgitation is trivial. Mild aortic valve sclerosis is present, with no evidence of aortic valve stenosis. Pulmonic Valve: The pulmonic valve was normal in structure. Pulmonic valve regurgitation is not visualized. No evidence of pulmonic stenosis. Aorta: Aortic dilatation noted. There is mild dilatation at the level of the sinuses of Valsalva measuring 43 mm. Venous: The inferior vena cava is dilated in size with  less than 50% respiratory variability, suggesting right atrial pressure of 15 mmHg. IAS/Shunts: No atrial level shunt detected by color flow Doppler.  LEFT VENTRICLE PLAX 2D LVIDd:         4.30 cm LVIDs:         3.55 cm LV PW:         1.05 cm LV IVS:        1.80 cm LVOT diam:     2.20 cm LV SV:         48 LV SV Index:   21 LVOT Area:     3.80 cm  LV Volumes (MOD) LV vol d, MOD A2C: 82.9 ml LV vol d, MOD A4C: 173.0 ml LV vol s, MOD A2C: 56.7 ml LV vol s, MOD A4C: 88.6 ml LV SV MOD A2C:     26.2 ml LV SV MOD A4C:     173.0 ml LV SV MOD BP:      51.9 ml LEFT ATRIUM         Index LA diam:    2.40 cm 1.06 cm/m  AORTIC VALVE LVOT Vmax:   75.00 cm/s LVOT Vmean:  57.000 cm/s LVOT VTI:    0.126 m  AORTA Ao Root diam: 4.30 cm MITRAL VALVE               TRICUSPID VALVE MV Area (PHT): 4.96 cm    TR Peak grad:   7.4 mmHg MV Decel Time: 153 msec    TR Vmax:        136.00 cm/s MV E velocity: 95.20 cm/s                            SHUNTS                            Systemic VTI:  0.13 m                            Systemic Diam: 2.20 cm Fransico Him MD Electronically signed by Fransico Him MD Signature Date/Time: 07/17/2019/10:43:14 AM    Final  ASSESSMENT AND PLAN: 1. 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 Colonoscopy 03/07/2019-nearly completely obstructing mass beginning at 16 cm from the anal verge, sigmoid colon polyp ? CTs 03/15/2019-wall thickening of the lower sigmoid colon, low rectal mass with significant luminal narrowing, diffuse liver metastases, borderline enlarged sigmoid mesocolon, iliac, and upper abdominal lymph nodes ? Elevated CEA ? 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 Cycle 1 FOLFOX 04/11/2019 ? Cycle 2 FOLFOX 05/28/2019 ? Cycle 3 FOLFOX 06/11/2019 ? Cycle 4 FOLFOX 06/25/2019 ? CT abdomen/pelvis 07/10/2019-severe  obstructing stricture at the sigmoid colon, stable to slight improvement in hepatic metastatic disease compared to February 2021 2. 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 Diabetes 4. Coronary artery disease, status post coronary artery bypass surgery in 2017 5. Neutropenia following cycle 1 FOLFOX 6. Admission 04/28/2019 with COVID-19 infection 7. C. difficile colitis 04/28/2019 8. Atrial flutter during hospital admission March/April 2021-treated with a Cardizem drip, digoxin, and Eliquis anticoagulation. Converted to metoprolol at discharge.  Admission with recurrent rapid atrial fibrillation/flutter 07/09/2019 9. 07/09/2019-hospital admission for bowel obstruction  Sigmoidoscopy 07/12/2019-completely obstructing mass at 17-20 cm proximal to the anus-oozing present, stent placed 10.  Fall 07/24/2019 with acute kidney injury/rhabdomyolysis 11.  Anemia secondary to chronic disease, malnutrition, phlebotomy, GI bleeding  Clarence Andrews appears unchanged.  He is awaiting placement at SNF.  AKI has resolved.  Anemia stable. He has metastatic colon cancer.  He underwent placement of a colon stent 07/12/2019.  A CT on 07/10/2019 revealed improvement in hepatic metastases.  The plan is to continue  chemotherapy.  If he is discharged today, we will arrange for outpatient follow-up for chemotherapy.  If he is not discharged today, will consider administering next cycle of chemotherapy as an inpatient.  Recommendations: 1.  Continue antibiotics for cellulitis  2.  Watch for rectal bleeding while maintained on heparin prophylaxis 3.  Skilled nursing facility placement -awaiting bed offer 4.  If he is not discharged today, will consider administration of his neck cycle of chemotherapy as an inpatient.   LOS: 4 days   Clarence Andrews 07/29/19 Clarence Andrews was interviewed and examined.  He appears unchanged.   He agrees to nursing facility placement.  The plan is to resume FOLFOX chemotherapy as soon as possible.

## 2019-07-29 NOTE — Care Management Important Message (Signed)
Important Message  Patient Details IM Letter given to Evette Cristal SW Case Manager to present to the Patient Name: Clarence Andrews. MRN: [id number] Date of Birth: 1950-06-01   Medicare Important Message Given:  Yes     Kerin Salen 07/29/2019, 10:46 AM

## 2019-07-29 NOTE — Progress Notes (Signed)
Physical Therapy Treatment Patient Details Name: Clarence Andrews. MRN: [id number] DOB: November 03, 1950 Today's Date: 07/29/2019    History of Present Illness 69 y.o. male with medical history significant for persistent atrial fibrillation/flutter on digoxin and Lopressor, combined diastolic/systolic CHF , metastatic colon cancer.  Patient was recently hospitalized from 6/8-6/23/2021 for A. fib with RVR, large bowel obstruction 2* colonic mass, orthostatic hypotension. Pt was offered SNF but declined. At home he fell, now admitted with rhabdomyolysis, AKI, cellulitis RLE.    PT Comments    Patient agreeable to progressing mobility today with decreased pain this session. Patient requried mod assist with VC's to sequence/complete supine to sit transfer. Pt limited by bil LE weakness and MAX +2 assist required for sit<>stand and stand pivot transfer with RW to move to [facility]. Pt required Max assist for pericare and was incontinent for bowel/bladder during mobility. Stedy utilized for sit<>stand and transfer to recliner with +2 assist for safety. Pt able to rise to more upright posture with use of Stedy compared to RW. Acute PT will continue to progress pt as able, continue to recommend SNF level follow up.     Follow Up Recommendations  SNF     Equipment Recommendations  None recommended by PT    Recommendations for Other Services       Precautions / Restrictions Precautions Precautions: Fall Precaution Comments: fell at home just prior to this admission Restrictions Weight Bearing Restrictions: No    Mobility  Bed Mobility Overal bed mobility: Needs Assistance Bed Mobility: Supine to Sit     Supine to sit: Mod assist;HOB elevated     General bed mobility comments: cues for LE sequencing and assist to bring LE's off EOB fully. cues for press up through Lt elbow and assist to raise trunk from elevated HOB.  Transfers Overall transfer level: Needs assistance Equipment used: Rolling  walker (2 wheeled) Transfers: Sit to/from [facility] Sit to Stand: +2 physical assistance;+2 safety/equipment;From elevated surface;Mod assist Stand pivot transfers: Mod assist;Max assist;+2 physical assistance;+2 safety/equipment;From elevated surface       General transfer comment: Mod assist +2 for sit<>stand from EOB with RW. VC's for hand placement/technique for power up. pt incontinent of b/b in standing, Max +2 assist to take small steps and pivot with RW to [facility]. Pt performed 3x sit<>stand from [facility] for pericare. Sted required for transfer to recliner.  Ambulation/Gait          Stairs        Wheelchair Mobility    Modified Rankin (Stroke Patients Only)       Balance Overall balance assessment: Needs assistance Sitting-balance support: Bilateral upper extremity supported;Feet supported Sitting balance-Leahy Scale: Fair Sitting balance - Comments: pt resting with UE support in sitting on BSC   Standing balance support: Bilateral upper extremity supported;During functional activity Standing balance-Leahy Scale: Poor         Cognition Arousal/Alertness: Awake/alert Behavior During Therapy: WFL for tasks assessed/performed Overall Cognitive Status: Within Functional Limits for tasks assessed        General Comments: pt alert and oriented x4, pt remains agreeable to SNF for rehab and is eager to discharge there.      Exercises      General Comments        Pertinent Vitals/Pain Pain Assessment: 0-10 Pain Score: 4  Pain Location: general, L tknee pain Pain Descriptors / Indicators: Sore Pain Intervention(s): Limited activity within patient's tolerance;Monitored during session;Repositioned  PT Goals (current goals can now be found in the care plan section) Acute Rehab PT Goals Patient Stated Goal: to get stronger at SNF PT Goal Formulation: With patient Time For Goal Achievement: 07/29/19 Potential to Achieve Goals:  Fair Progress towards PT goals: Progressing toward goals    Frequency    Min 2X/week      PT Plan Current plan remains appropriate       AM-PAC PT "6 Clicks" Mobility   Outcome Measure  Help needed turning from your back to your side while in a flat bed without using bedrails?: A Lot Help needed moving from lying on your back to sitting on the side of a flat bed without using bedrails?: A Lot Help needed moving to and from a bed to a chair (including a wheelchair)?: Total Help needed standing up from a chair using your arms (e.g., wheelchair or bedside chair)?: Total Help needed to walk in hospital room?: Total Help needed climbing 3-5 steps with a railing? : Total 6 Click Score: 8    End of Session Equipment Utilized During Treatment: Gait belt Activity Tolerance: Patient tolerated treatment well Patient left: in chair;with call bell/phone within reach;with chair alarm set Nurse Communication: Mobility status;Need for lift equipment;Other (comment) (condom cath off; Stedy with +2 for transfer) PT Visit Diagnosis: Other abnormalities of gait and mobility (R26.89);Muscle weakness (generalized) (M62.81)     Time: 5498-2641 PT Time Calculation (min) (ACUTE ONLY): 51 min  Charges:  $Therapeutic Activity: 38-52 mins                     Verner Mould, DPT Acute Rehabilitation Services  Office [phone] Pager [phone]  07/29/2019 1:19 PM

## 2019-07-30 LAB — CBC
HCT: 26.9 % — ABNORMAL LOW (ref 39.0–52.0)
Hemoglobin: 8.2 g/dL — ABNORMAL LOW (ref 13.0–17.0)
MCH: 28 pg (ref 26.0–34.0)
MCHC: 30.5 g/dL (ref 30.0–36.0)
MCV: 91.8 fL (ref 80.0–100.0)
Platelets: 267 10*3/uL (ref 150–400)
RBC: 2.93 MIL/uL — ABNORMAL LOW (ref 4.22–5.81)
RDW: 16.4 % — ABNORMAL HIGH (ref 11.5–15.5)
WBC: 6.7 10*3/uL (ref 4.0–10.5)
nRBC: 0 % (ref 0.0–0.2)

## 2019-07-30 LAB — GLUCOSE, CAPILLARY
Glucose-Capillary: 88 mg/dL (ref 70–99)
Glucose-Capillary: 113 mg/dL — ABNORMAL HIGH (ref 70–99)
Glucose-Capillary: 95 mg/dL (ref 70–99)
Glucose-Capillary: 104 mg/dL — ABNORMAL HIGH (ref 70–99)
Glucose-Capillary: 103 mg/dL — ABNORMAL HIGH (ref 70–99)

## 2019-07-30 LAB — BASIC METABOLIC PANEL
Anion gap: 8 (ref 5–15)
BUN: 17 mg/dL (ref 8–23)
CO2: 29 mmol/L (ref 22–32)
Calcium: 8.3 mg/dL — ABNORMAL LOW (ref 8.9–10.3)
Chloride: 99 mmol/L (ref 98–111)
Creatinine, Ser: 1.03 mg/dL (ref 0.61–1.24)
GFR calc Af Amer: >60 mL/min (ref >60)
GFR calc non Af Amer: >60 mL/min (ref >60)
Glucose, Bld: 85 mg/dL (ref 70–99)
Potassium: 3.9 mmol/L (ref 3.5–5.1)
Sodium: 136 mmol/L (ref 135–145)

## 2019-07-30 LAB — MAGNESIUM: Magnesium: 1.7 mg/dL (ref 1.7–2.4)

## 2019-07-30 NOTE — Progress Notes (Signed)
Patient seen and examined at bedside, no acute events overnight.  No complaints this morning.  Discharge summary completed yesterday.  Awaiting insurance authorization.  Stable for discharge from medical standpoint.  Case discussed with patient's RN.  [name] MD [facility]

## 2019-07-30 NOTE — TOC Progression Note (Addendum)
Transition of Care (TOC) - Progression Note    Patient Details  Name: Clarence Andrews. MRN: [id number] Date of Birth: Nov 22, 1950  Transition of [facility]) CM/SW Contact  Ross Ludwig, [place] Phone Number:  07/30/2019, 12:41 PM  Clinical Narrative:     SNF still waiting for insurance authorization, CSW continuing to follow patient's progress throughout discharge planning.  4:15pm  CSW received phone call from [facility] that patient has been approved by [facility] for SNF placement.  SNF stated they can accept patient tomorrow.  CSW updated patient, bedside nurse, and physician.  Expected Discharge Plan: [place] Barriers to Discharge: Continued Medical Work up  Expected Discharge Plan and Services Expected Discharge Plan: [place] In-house Referral: Clinical Social Work   Post Acute Care Choice: [place] Living arrangements for the past 2 months: Single Family Home Expected Discharge Date: 07/30/19                                     Social Determinants of Health (SDOH) Interventions    Readmission Risk Interventions No flowsheet data found.

## 2019-07-31 ENCOUNTER — Telehealth: Payer: Self-pay | Admitting: Nurse Practitioner

## 2019-07-31 LAB — CBC
HCT: 28.9 % — ABNORMAL LOW (ref 39.0–52.0)
Hemoglobin: 8.8 g/dL — ABNORMAL LOW (ref 13.0–17.0)
MCH: 28 pg (ref 26.0–34.0)
MCHC: 30.4 g/dL (ref 30.0–36.0)
MCV: 92 fL (ref 80.0–100.0)
Platelets: 253 10*3/uL (ref 150–400)
RBC: 3.14 MIL/uL — ABNORMAL LOW (ref 4.22–5.81)
RDW: 16.2 % — ABNORMAL HIGH (ref 11.5–15.5)
WBC: 6.8 10*3/uL (ref 4.0–10.5)
nRBC: 0 % (ref 0.0–0.2)

## 2019-07-31 LAB — BASIC METABOLIC PANEL
Anion gap: 9 (ref 5–15)
BUN: 15 mg/dL (ref 8–23)
CO2: 29 mmol/L (ref 22–32)
Calcium: 8.5 mg/dL — ABNORMAL LOW (ref 8.9–10.3)
Chloride: 100 mmol/L (ref 98–111)
Creatinine, Ser: 0.95 mg/dL (ref 0.61–1.24)
GFR calc Af Amer: >60 mL/min (ref >60)
GFR calc non Af Amer: >60 mL/min (ref >60)
Glucose, Bld: 82 mg/dL (ref 70–99)
Potassium: 3.9 mmol/L (ref 3.5–5.1)
Sodium: 138 mmol/L (ref 135–145)

## 2019-07-31 LAB — GLUCOSE, CAPILLARY
Glucose-Capillary: 79 mg/dL (ref 70–99)
Glucose-Capillary: 95 mg/dL (ref 70–99)
Glucose-Capillary: 121 mg/dL — ABNORMAL HIGH (ref 70–99)
Glucose-Capillary: 297 mg/dL — ABNORMAL HIGH (ref 70–99)

## 2019-07-31 LAB — MAGNESIUM: Magnesium: 1.6 mg/dL — ABNORMAL LOW (ref 1.7–2.4)

## 2019-07-31 MED ORDER — HOT PACK MISC ONCOLOGY
1.0000 | Freq: Once | Status: AC | PRN
  Filled 2019-07-31: qty 1

## 2019-07-31 MED ORDER — SODIUM CHLORIDE 0.9% FLUSH: 10.0000 mL | INTRAVENOUS | Status: DC | PRN

## 2019-07-31 MED ORDER — SODIUM CHLORIDE 0.9 % IV SOLN
10.0000 mg | Freq: Once | INTRAVENOUS | Status: AC
  Administered 2019-07-31: 10 mg via INTRAVENOUS
  Filled 2019-07-31: qty 1

## 2019-07-31 MED ORDER — MAGNESIUM SULFATE 2 GM/50ML IV SOLN
2.0000 g | Freq: Once | INTRAVENOUS | Status: AC
  Administered 2019-07-31: 2 g via INTRAVENOUS
  Filled 2019-07-31: qty 50

## 2019-07-31 MED ORDER — OXALIPLATIN CHEMO INJECTION 100 MG/20ML
65.0000 mg/m2 | Freq: Once | INTRAVENOUS | Status: AC
  Administered 2019-07-31: 150 mg via INTRAVENOUS
  Filled 2019-07-31: qty 10

## 2019-07-31 MED ORDER — DEXTROSE 5 % IV SOLN: Freq: Once | INTRAVENOUS | Status: AC

## 2019-07-31 MED ORDER — PALONOSETRON HCL INJECTION 0.25 MG/5ML
0.2500 mg | Freq: Once | INTRAVENOUS | Status: AC
  Administered 2019-07-31: 0.25 mg via INTRAVENOUS
  Filled 2019-07-31: qty 5

## 2019-07-31 MED ORDER — SODIUM CHLORIDE 0.9 % IV SOLN: 2000.0000 mg/m2 | INTRAVENOUS | Status: DC

## 2019-07-31 MED ORDER — OXALIPLATIN CHEMO INJECTION 100 MG/20ML
85.0000 mg/m2 | Freq: Once | INTRAVENOUS | Status: DC
  Filled 2019-07-31: qty 40

## 2019-07-31 MED ORDER — LEUCOVORIN CALCIUM INJECTION 350 MG
400.0000 mg/m2 | Freq: Once | INTRAVENOUS | Status: AC
  Administered 2019-07-31: 932 mg via INTRAVENOUS
  Filled 2019-07-31: qty 46.6

## 2019-07-31 MED ORDER — SODIUM CHLORIDE 0.9 % IV SOLN
2000.0000 mg/m2 | Freq: Once | INTRAVENOUS | Status: AC
  Administered 2019-07-31: 4650 mg via INTRAVENOUS
  Filled 2019-07-31: qty 93

## 2019-07-31 MED ORDER — CEPHALEXIN 500 MG PO CAPS
500.0000 mg | ORAL_CAPSULE | Freq: Four times a day (QID) | ORAL | Status: AC
  Administered 2019-07-31 – 2019-08-02 (×8): 500 mg via ORAL
  Filled 2019-07-31 (×8): qty 1

## 2019-07-31 NOTE — TOC Progression Note (Addendum)
Transition of Care (TOC) - Progression Note    Patient Details  Name: Clarence Andrews. MRN: [id number] Date of Birth: 10-11-1950  Transition of Care [facility]) CM/SW Contact  Ross Ludwig, [place] Phone Number: 07/31/2019, 10:59 AM  Clinical Narrative:     CSW spoke with patient to discuss discharge planning.  Patient will be going to [facility] for short term rehab potentially today, once oncology has provided patient with his chemo treatment.  CSW explained to patient that the SNFs will usually coordinate with oncology to make sure he receives his cancer treatment.  Patient expressed being nervous about going to SNF and and also making sure he receives his chemo treatments.  CSW informed him that usually SNFs are good at working with cancer center.  11:20am CSW was informed that patient will be getting chemo for two days per physician before he discharges.  CSW updated SNF, who confirmed patient's insurance approval is good through Friday.  CSW to continue to follow patient's progress throughout discharge planning.                                         Expected Discharge Plan: [place] Barriers to Discharge: Continued Medical Work up  Expected Discharge Plan and Services Expected Discharge Plan: [place] In-house Referral: Clinical Social Work   Post Acute Care Choice: [place] Living arrangements for the past 2 months: Single Family Home Expected Discharge Date: 07/30/19                                     Social Determinants of Health (SDOH) Interventions    Readmission Risk Interventions No flowsheet data found.

## 2019-07-31 NOTE — Progress Notes (Signed)
PROGRESS NOTE    [name]  ONG:[id number] DOB: 1950/02/07 DOA: 07/25/2019 PCP: Patient, No Pcp Per   Brief Narrative: 69 y.o.malewith medical history significant forpersistent atrial fibrillation/flutter on digoxin and Lopressor, combined diastolic/systolic CHF, metastatic colon cancer. Patient was recently hospitalized from 6/8-6/23/2021 for A. fib with RVR. Went home despite of being offered SNF. At home he tripped and fell due to weakness, found by the neighbor therefore brought to the hospital. Work-up was negative for trauma but showed UTI and mild rhabdomyolysis.  His urinary tract infection, cellulitis was treated with IV antibiotics.  Seen by PT who recommended SNF.  He has a bed at a skilled nursing facility but since he missed several days/weeks of chemotherapy, his oncologist Dr. Learta Codding wants to start chemotherapy while inpatient.  Plan for discharge to skilled nursing facility on Friday.  Assessment & Plan:   Active Problems:   Cancer of left colon (HCC)   Tachycardia   AKI (acute kidney injury) ([place])   Malignant neoplasm of colon (HCC)   Atrial flutter (HCC)   Atrial fibrillation with RVR (HCC)   Chronic systolic heart failure (HCC)   Orthostatic hypotension   Cellulitis of right leg   Rhabdomyolysis   Unwitnessed fall   Physical deconditioning   Pressure injury of skin   Mechanical fall, generalized weakness PT recommended skilled nursing facility.  Has a bed available at skilled nursing facility  Urinary tract infection with hematuria Urine cultures-Proteus vulgaris.  Improving on cefazolin.  Transitioning to Keflex   Right lower leg nonpurulent cellulitis, improving Erythema and tenderness of the right lower extremity. Transition to Keflex  Right lower extremity Dopplers-negative for DVT  AKI, suspect prerenal.  Baseline creatinine 0.9.  Remained stable  Rhabdomyolysis in the setting of unwitnessed fall.  Resolved with IV  fluids  Atrial fibrillation/flutter, persistent, intermittent RVR. Currently in A. fib, rate controlled. Poor candidate for anticoagulation Continue digoxin p.o. daily, Lopressor 100 mg p.o. twice daily  Orthostatic hypotension, possibly symptomatic Suspect some autonomic dysfunction with a component of dehydration,treated with IV fluids. 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 [name] as outpatient, seen by him during this hospitalization.  He is due for his next cycle, plan is to start chemo here as an inpatient.  Anemia of chronic disease (chemotherapy, colon cancer) Hemoglobin stable, at baseline  Chronic combined CHF, stable.  History of systolic CHF, TTE on most recent admission showed preserved EF. Overall appears to be euvolemic in nature. Resume his home medication including beta-blocker.   Type 2 diabetes, A1c 7.8. Uncontrolled-current acceptable range Resume home regimen upon discharge  CAD status post CABG, stable Currently without any chest pain         DVT prophylaxis:Heparin [place] Code Status: Full Family Communication: None present at the bedside Status is: Inpatient  Remains inpatient appropriate because:IV treatments appropriate due to intensity of illness or inability to take PO   Dispo: The patient is from: Home              Anticipated d/c is to: SNF              Anticipated d/c date is: 2 days              Patient currently is medically stable  to d/c.  Waiting to complete chemo     Consultants: Oncology  Procedures:None  Antimicrobials:  Anti-infectives (From admission, onward)   Start     Dose/Rate Route Frequency Ordered Stop   07/29/19 0000  cephALEXin (KEFLEX) 500 MG  capsule     Discontinue     500 mg Oral 4 times daily 07/29/19 1216 07/31/19 2359   07/26/19 0815  ceFAZolin (ANCEF) IVPB 1 g/50 mL premix  Status:  Discontinued        1 g 100 mL/hr over 30 Minutes Intravenous Every 8 hours 07/26/19 0804 07/26/19 0809   07/25/19 1545  ceFAZolin (ANCEF) IVPB 1 g/50 mL premix     Discontinue     1 g 100 mL/hr over 30 Minutes Intravenous Every 8 hours 07/25/19 1530        Subjective: Patient seen and examined at the bedside this morning.  Hemodynamically stable.  Comfortable.  Denies any complaints.  He is very interested to take the chemotherapy while as an inpatient.  Objective: Vitals:   07/30/19 1300 07/30/19 2232 07/31/19 0500 07/31/19 0511  BP: 138/77 139/78  127/67  Pulse: 65 71  68  Resp: 16 18  15   Temp: (!) 97.5 F (36.4 C) (!) 97.4 F (36.3 C)  98.3 F (36.8 C)  TempSrc: Oral Oral  Oral  SpO2: 100% 100%  99%  Weight:   104 kg   Height:        Intake/Output Summary (Last 24 hours) at 07/31/2019 1114 Last data filed at 07/31/2019 0900 Gross per 24 hour  Intake --  Output 1550 ml  Net -1550 ml   Filed Weights   07/29/19 0520 07/30/19 0500 07/31/19 0500  Weight: 103.9 kg 104 kg 104 kg    Examination:  General exam: Last weakness, deconditioned male Respiratory system: Bilateral equal air entry, normal vesicular breath sounds, no wheezes or crackles  Cardiovascular system: S1 & S2 heard, RRR. No JVD, murmurs, rubs, gallops or clicks. No pedal edema. Gastrointestinal system: Abdomen is nondistended, soft and nontender. No organomegaly or masses felt. Normal bowel sounds heard. Central nervous system: Alert and oriented. No focal neurological deficits. Extremities: No edema, no clubbing ,no cyanosis   Data Reviewed: I have personally reviewed following labs and imaging studies  CBC: Recent Labs  Lab 07/25/19 1030 07/26/19 0322 07/27/19 0401 07/28/19 0405 07/29/19 0405 07/30/19 0427 07/31/19 0254  WBC 16.3*   < > 7.8 7.4  6.1 6.7 6.8  NEUTROABS 13.8*  --   --   --   --   --   --   HGB 9.2*   < > 8.3* 7.9* 8.2* 8.2* 8.8*  HCT 30.0*   < > 26.3* 25.7* 26.9* 26.9* 28.9*  MCV 92.3   < > 91.0 92.4 93.4 91.8 92.0  PLT 348   < > 269 290 275 267 253   < > = values in this interval not displayed.   Basic Metabolic Panel: Recent Labs  Lab 07/27/19 0401 07/28/19 0405 07/29/19 0405 07/30/19 0427 07/31/19 0254  NA 134* 137 139 136 138  K 3.5 4.4 4.0 3.9 3.9  CL 101 102 102 99 100  CO2 27 27 29 29 29   GLUCOSE 105* 109* 86 85 82  BUN 21 18 16 17 15   CREATININE 0.94 1.01 0.83 1.03 0.95  CALCIUM 7.7* 8.0* 8.5* 8.3* 8.5*  MG 1.4* 1.6* 1.9 1.7 1.6*   GFR: Estimated Creatinine Clearance: 91.7 mL/min (by C-G formula based on SCr of  0.95 mg/dL). Liver Function Tests: No results for input(s): AST, ALT, ALKPHOS, BILITOT, PROT, ALBUMIN in the last 168 hours. No results for input(s): LIPASE, AMYLASE in the last 168 hours. No results for input(s): AMMONIA in the last 168 hours. Coagulation Profile: No results for input(s): INR, PROTIME in the last 168 hours. Cardiac Enzymes: Recent Labs  Lab 07/25/19 1030 07/26/19 0322  CKTOTAL 1,241* 528*   BNP (last 3 results) No results for input(s): PROBNP in the last 8760 hours. HbA1C: No results for input(s): HGBA1C in the last 72 hours. CBG: Recent Labs  Lab 07/30/19 1052 07/30/19 1215 07/30/19 1618 07/30/19 2013 07/31/19 0810  GLUCAP 113* 95 104* 103* 79   Lipid Profile: No results for input(s): CHOL, HDL, LDLCALC, TRIG, CHOLHDL, LDLDIRECT in the last 72 hours. Thyroid Function Tests: No results for input(s): TSH, T4TOTAL, FREET4, T3FREE, THYROIDAB in the last 72 hours. Anemia Panel: No results for input(s): VITAMINB12, FOLATE, FERRITIN, TIBC, IRON, RETICCTPCT in the last 72 hours. Sepsis Labs: No results for input(s): PROCALCITON, LATICACIDVEN in the last 168 hours.  Recent Results (from the past 240 hour(s))  C Difficile Quick Screen (NO PCR Reflex)      Status: None   Collection Time: 07/23/19  4:06 PM   Specimen: STOOL  Result Value Ref Range Status   C Diff antigen NEGATIVE NEGATIVE Final   C Diff toxin NEGATIVE NEGATIVE Final   C Diff interpretation No C. difficile detected.  Final    Comment: Performed at [facility] [place] [address], [place], [place] [address]  Culture, Urine     Status: Abnormal   Collection Time: 07/25/19 12:13 PM   Specimen: Urine, Random  Result Value Ref Range Status   Specimen Description   Final    URINE, RANDOM Performed at [place] [address], [place], [place] [address]    Special Requests   Final    NONE Performed at [facility], [place] [address], [place], [place] [address]    Culture 30,000 COLONIES/mL PROTEUS VULGARIS (A)  Final   Report Status 07/28/2019 FINAL  Final   Organism ID, Bacteria PROTEUS VULGARIS (A)  Final      Susceptibility   Proteus vulgaris - MIC*    AMPICILLIN >=32 RESISTANT Resistant     CEFAZOLIN >=64 RESISTANT Resistant     CIPROFLOXACIN 0.5 SENSITIVE Sensitive     GENTAMICIN <=1 SENSITIVE Sensitive     IMIPENEM 2 SENSITIVE Sensitive     NITROFURANTOIN 128 RESISTANT Resistant     TRIMETH/SULFA <=20 SENSITIVE Sensitive     AMPICILLIN/SULBACTAM 16 INTERMEDIATE Intermediate     PIP/TAZO <=4 SENSITIVE Sensitive     * 30,000 COLONIES/mL PROTEUS VULGARIS  SARS Coronavirus 2 by RT PCR (hospital order, performed in [place] hospital lab) Nasopharyngeal Nasopharyngeal Swab     Status: None   Collection Time: 07/25/19  2:29 PM   Specimen: Nasopharyngeal Swab  Result Value Ref Range Status   SARS Coronavirus 2 NEGATIVE NEGATIVE Final    Comment: (NOTE) SARS-CoV-2 target nucleic acids are NOT DETECTED.  The SARS-CoV-2 RNA is generally detectable in upper and lower respiratory specimens during the acute phase of infection. The lowest concentration of SARS-CoV-2 viral copies this assay can detect is 250 copies /  mL. A negative result does not preclude SARS-CoV-2 infection and should not be used as the sole basis for treatment or other patient management decisions.  A negative result may occur with improper specimen collection /  handling, submission of specimen other than nasopharyngeal swab, presence of viral mutation(s) within the areas targeted by this assay, and inadequate number of viral copies (<250 copies / mL). A negative result must be combined with clinical observations, patient history, and epidemiological information.  Fact Sheet for Patients:   StrictlyIdeas.no  Fact Sheet for Healthcare Providers: BankingDealers.co.za  This test is not yet approved or  cleared by the [place] FDA and has been authorized for detection and/or diagnosis of SARS-CoV-2 by FDA under an Emergency Use Authorization (EUA).  This EUA will remain in effect (meaning this test can be used) for the duration of the COVID-19 declaration under Section 564(b)(1) of the Act, 21 U.S.C. section 360bbb-3(b)(1), unless the authorization is terminated or revoked sooner.  Performed at [facility], [place] [address], [place], [place] [address]   SARS CORONAVIRUS 2 (TAT 6-24 HRS) Nasopharyngeal Nasopharyngeal Swab     Status: None   Collection Time: 07/28/19  9:42 AM   Specimen: Nasopharyngeal Swab  Result Value Ref Range Status   SARS Coronavirus 2 NEGATIVE NEGATIVE Final    Comment: (NOTE) SARS-CoV-2 target nucleic acids are NOT DETECTED.  The SARS-CoV-2 RNA is generally detectable in upper and lower respiratory specimens during the acute phase of infection. Negative results do not preclude SARS-CoV-2 infection, do not rule out co-infections with other pathogens, and should not be used as the sole basis for treatment or other patient management decisions. Negative results must be combined with clinical observations, patient history, and  epidemiological information. The expected result is Negative.  Fact Sheet for Patients: SugarRoll.be  Fact Sheet for Healthcare Providers: https://www.woods-mathews.com/  This test is not yet approved or cleared by the [place] FDA and  has been authorized for detection and/or diagnosis of SARS-CoV-2 by FDA under an Emergency Use Authorization (EUA). This EUA will remain  in effect (meaning this test can be used) for the duration of the COVID-19 declaration under Se ction 564(b)(1) of the Act, 21 U.S.C. section 360bbb-3(b)(1), unless the authorization is terminated or revoked sooner.  Performed at [facility], [place] [address] [place], [place] [address]          Radiology Studies: No results found.      Scheduled Meds: . Chlorhexidine Gluconate Cloth  6 each Topical Daily  . digoxin  0.25 mg Oral Daily  . ferrous sulfate  325 mg Oral Q breakfast  . heparin  5,000 Units Subcutaneous Q8H  . insulin aspart  0-9 Units Subcutaneous TID WC  . insulin glargine  10 Units Subcutaneous Daily  . magnesium oxide  800 mg Oral Daily  . metoprolol tartrate  100 mg Oral BID  . midodrine  5 mg Oral TID WC  . senna  1 tablet Oral BID  . sodium chloride flush  10-40 mL Intracatheter Q12H   Continuous Infusions: .  ceFAZolin (ANCEF) IV 1 g (07/31/19 0636)     LOS: 6 days    Time spent: 25 mins. More than 50% of that time was spent in counseling and/or coordination of care.      Shelly Coss, MD [facility] P6/30/2021, 11:14 AM

## 2019-07-31 NOTE — Progress Notes (Signed)
HEMATOLOGY-ONCOLOGY PROGRESS NOTE  SUBJECTIVE: No complaint.  He has been approved for a skilled nursing bed and [place].  He would like to receive chemotherapy today.  He reports no nausea.  He is having bowel movements.  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[place])   04/11/2019 -  Chemotherapy   The patient had palonosetron (ALOXI) injection 0.25 mg, 0.25 mg, Intravenous,  Once, 5 of 6 cycles Administration: 0.25 mg (04/11/2019), 0.25 mg (05/28/2019), 0.25 mg (06/11/2019), 0.25 mg (06/25/2019) pegfilgrastim-cbqv (UDENYCA) injection 6 mg, 6 mg, Subcutaneous, Once, 4 of 5 cycles Administration: 6 mg (05/30/2019), 6 mg (06/13/2019), 6 mg (06/27/2019) fluorouracil (ADRUCIL) 4,650 mg in sodium chloride 0.9 % 1,000 mL chemo infusion, 2,000 mg/m2 = 4,650 mg (100 % of original dose 2,000 mg/m2), Intravenous, Once, 1 of 1 cycle Dose modification: 2,000 mg/m2 (original dose 2,000 mg/m2, Cycle 5, Reason: Provider Judgment) leucovorin 972 mg in dextrose 5 % 250 mL infusion, 400 mg/m2 = 972 mg, Intravenous,  Once, 5 of 6 cycles Administration: 972 mg (04/11/2019), 972 mg (05/28/2019), 972 mg (06/11/2019), 972 mg (06/25/2019) oxaliplatin (ELOXATIN) 200 mg in dextrose 5 % 500 mL chemo infusion, 83 mg/m2 = 205 mg, Intravenous,  Once, 5 of 6 cycles Administration: 200 mg (04/11/2019), 200 mg (05/28/2019), 200 mg (06/11/2019), 200 mg (06/25/2019) fluorouracil (ADRUCIL) chemo injection 950 mg, 400 mg/m2 = 950 mg, Intravenous,  Once, 1 of 1 cycle Administration: 950 mg (04/11/2019) fluorouracil (ADRUCIL) 5,850 mg in sodium chloride 0.9 % 133 mL chemo infusion, 2,400 mg/m2 = 5,850 mg, Intravenous, 1 Day/Dose, 5 of 6 cycles Dose modification: 2,000 mg/m2 (original dose 2,400 mg/m2, Cycle 2, Reason: Provider Judgment) Administration: 5,850 mg (04/11/2019), 4,850 mg (05/28/2019), 4,850 mg (06/11/2019), 4,850 mg (06/25/2019)  for chemotherapy treatment.    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[place])   04/11/2019 -  Chemotherapy   The patient had palonosetron (ALOXI) injection 0.25 mg, 0.25 mg, Intravenous,  Once, 5 of 6 cycles Administration: 0.25 mg (04/11/2019), 0.25 mg (05/28/2019), 0.25 mg (06/11/2019), 0.25 mg (06/25/2019) pegfilgrastim-cbqv (UDENYCA) injection 6 mg, 6 mg, Subcutaneous, Once, 4 of 5 cycles Administration: 6 mg (05/30/2019), 6 mg (06/13/2019), 6 mg (06/27/2019) fluorouracil (ADRUCIL) 4,650 mg in sodium chloride 0.9 % 1,000 mL chemo infusion, 2,000 mg/m2 = 4,650 mg (100 % of original dose 2,000 mg/m2), Intravenous, Once, 1 of 1 cycle Dose modification: 2,000 mg/m2 (original dose 2,000 mg/m2, Cycle 5, Reason: Provider Judgment) leucovorin 972 mg in dextrose 5 % 250 mL infusion, 400 mg/m2 = 972 mg, Intravenous,  Once, 5 of 6 cycles Administration: 972 mg (04/11/2019), 972 mg (05/28/2019), 972 mg (06/11/2019), 972 mg (06/25/2019) oxaliplatin (ELOXATIN) 200 mg in dextrose 5 % 500 mL chemo infusion, 83 mg/m2 = 205 mg, Intravenous,  Once, 5 of 6 cycles Administration: 200 mg (04/11/2019), 200 mg (05/28/2019), 200 mg (06/11/2019), 200 mg (06/25/2019) fluorouracil (ADRUCIL) chemo injection 950 mg, 400 mg/m2 = 950 mg, Intravenous,  Once, 1 of 1 cycle Administration: 950 mg (04/11/2019) fluorouracil (ADRUCIL) 5,850 mg in sodium chloride 0.9 % 133 mL chemo infusion, 2,400 mg/m2 = 5,850 mg, Intravenous, 1 Day/Dose, 5 of 6 cycles Dose modification: 2,000 mg/m2 (original dose 2,400 mg/m2, Cycle 2, Reason: Provider Judgment) Administration: 5,850 mg (04/11/2019), 4,850 mg (05/28/2019), 4,850 mg (06/11/2019), 4,850 mg (06/25/2019)  for chemotherapy treatment.      PHYSICAL EXAMINATION:  Vitals:   07/30/19  2232 07/31/19 0511  BP: 139/78 127/67  Pulse: 71 68  Resp: 18 15  Temp: (!) 97.4 F (36.3 C) 98.3 F (36.8 C)  SpO2: 100% 99%   Filed Weights   07/29/19 0520 07/30/19 0500 07/31/19 0500  Weight:  229 lb (103.9 kg) 229 lb 4.5 oz (104 kg) 229 lb 4.5 oz (104 kg)    Intake/Output from previous day: 06/29 0701 - 06/30 0700 In: -  Out: 1125 [Urine:1125]   HEENT: No thrush or ulcers  HEART: Regular rhythm, grouped beats ABDOMEN:abdomen soft, non-tender, no hepatomegaly NEURO: alert & oriented x 3 with fluent speech Skin: Abrasions at the right lower leg, resolving blisters at the lower leg bilaterally  LABORATORY DATA:  I have reviewed the data as listed CMP Latest Ref Rng & Units 07/31/2019 07/30/2019 07/29/2019  Glucose 70 - 99 mg/dL 82 85 86  BUN 8 - 23 mg/dL _0 Creatinine 0.61 - 1.24 mg/dL 0.95 1.03 0.83  Sodium 135 - 145 mmol/L 138 136 139  Potassium 3.5 - 5.1 mmol/L 3.9 3.9 4.0  Chloride 98 - 111 mmol/L 100 99 102  CO2 22 - 32 mmol/L _1 Calcium 8.9 - 10.3 mg/dL 8.5(L) 8.3(L) 8.5(L)  Total Protein 6.5 - 8.1 g/dL - - -  Total Bilirubin 0.3 - 1.2 mg/dL - - -  Alkaline Phos 38 - 126 U/L - - -  AST 15 - 41 U/L - - -  ALT 0 - 44 U/L - - -    Lab Results  Component Value Date   WBC 6.8 07/31/2019   HGB 8.8 (L) 07/31/2019   HCT 28.9 (L) 07/31/2019   MCV 92.0 07/31/2019   PLT 253 07/31/2019   NEUTROABS 13.8 (H) 07/25/2019    DG Chest 1 View  Result Date: 07/09/2019 CLINICAL DATA:  Tachycardia and diffuse abdominal pain. EXAM: CHEST  1 VIEW COMPARISON:  04/28/2019 FINDINGS: Previous median sternotomy. Power port inserted from a right jugular approach with tip at the SVC RA junction. Heart size is upper limits of normal with some left ventricular prominence. No acute mediastinal finding. The pulmonary vascularity is normal. The lungs are clear. No infiltrate, collapse or effusion. IMPRESSION: No active disease. Electronically Signed   By: Nelson Chimes M.D.   On: 07/09/2019 14:21   DG Lumbar Spine Complete  Result Date: 07/25/2019 CLINICAL DATA:  Low back pain after fall. EXAM: LUMBAR SPINE - COMPLETE 4+ VIEW COMPARISON:  07/13/2019. FINDINGS: Diffuse multilevel  degenerative change. No acute bony abnormality identified. No evidence of fracture. Rectosigmoid stent noted. IMPRESSION: 1. Diffuse multilevel degenerative change. No acute bony abnormality. 2.  Rectosigmoid stent again noted. Electronically Signed   By: Marcello Moores  Register   On: 07/25/2019 13:14   DG Tibia/Fibula Right  Result Date: 07/25/2019 CLINICAL DATA:  Fall right lower leg injury EXAM: RIGHT TIBIA AND FIBULA - 2 VIEW COMPARISON:  None. FINDINGS: Tiny ossific linear density is seen adjacent to the lateral femoral condyle which could represent a tiny chip fracture. Fragmented enthesophytes seen at the anterior tibial tubercle. Mild overlying soft tissue swelling seen around the lateral aspect of the knee. IMPRESSION: Tiny linear density seen adjacent to the lateral femoral condyle with overlying soft tissue swelling which could represent a tiny chip fracture. Please correlate with the patient's site of pain. Electronically Signed   By: Prudencio Pair M.D.   On: 07/25/2019 11:28   DG Ankle Complete Right  Result Date: 07/25/2019 CLINICAL DATA:  Patient had  a mechanical fall last night around 10pm, tripped over a canister, states he is weak because the hospital would not let him walk. Reports R knee and R ankle pain. Weeping and skin tears noted to most of right tib/fib .*comment was truncated*fell, R lower leg injury EXAM: RIGHT ANKLE - COMPLETE 3+ VIEW COMPARISON:  None. FINDINGS: Ankle mortise intact. The talar dome is normal. No malleolar fracture. The calcaneus is normal. Enthesopathic spurring of the calcaneus. IMPRESSION: No fracture or dislocation. Electronically Signed   By: Suzy Bouchard M.D.   On: 07/25/2019 11:31   DG Abd 1 View  Result Date: 07/10/2019 CLINICAL DATA:  Nasogastric tube placement. EXAM: ABDOMEN - 1 VIEW COMPARISON:  July 09, 2019 (1:53 p.m.) FINDINGS: Multiple sternal wires and sternal fixation plates and screws are seen. A nasogastric tube is seen with its distal tip  overlying the body of the stomach. Numerous dilated small bowel loops are again seen throughout the abdomen and pelvis. No radio-opaque calculi or other significant radiographic abnormality are seen. IMPRESSION: Nasogastric tube positioning, as described above. Electronically Signed   By: Virgina Norfolk M.D.   On: 07/10/2019 17:56   DG Abdomen 1 View  Result Date: 07/09/2019 CLINICAL DATA:  Tachycardia.  Diffuse abdominal pain. EXAM: ABDOMEN - 1 VIEW COMPARISON:  None. FINDINGS: There is gas throughout small and large bowel most consistent with ileus. No focal dilatation to suggest obstruction. No free air. No abnormal calcifications. Ordinary degenerative changes affect the spine. IMPRESSION: Possible ileus pattern. No evidence of frank obstruction or focal lesion. Electronically Signed   By: Nelson Chimes M.D.   On: 07/09/2019 14:22   CT ABDOMEN PELVIS W CONTRAST  Result Date: 07/10/2019 CLINICAL DATA:  Rectal tumor. Abdominal distention. No bowel movement for days. EXAM: CT ABDOMEN AND PELVIS WITH CONTRAST TECHNIQUE: Multidetector CT imaging of the abdomen and pelvis was performed using the standard protocol following bolus administration of intravenous contrast. CONTRAST:  132m OMNIPAQUE IOHEXOL 300 MG/ML  SOLN COMPARISON:  03/15/2019 FINDINGS: Lower chest: Atelectasis and patchy airspace opacity in the lower lungs. Atherosclerosis and postoperative heart. Hepatobiliary: Multifocal hepatic metastatic disease with ill-defined masses measuring up to 4 cm in the posterior right liver, roughly 5 mm smaller than the before. Evidence of biliary obstruction or stone. Pancreas: Diffuse fatty atrophy with some preservation of density at the tail. There could be a cyst at the uncinate process, incidental in this setting. Spleen: Unremarkable. Adrenals/Urinary Tract: Negative adrenals. No hydronephrosis or stone. Simple bilateral renal cysts. Unremarkable bladder. Stomach/Bowel: Known sigmoid mass with severe  luminal stenosis. The adjacent wall thickening is less thickened, but there is still spiculated density extending through the serosa into the mesentery. Above this narrowing there is generalized gas and fluid dilated bowel. Vascular/Lymphatic: No acute vascular finding. Decrease in size of IMA lymph nodes. Reproductive:No pathologic findings. Other: No ascites or pneumoperitoneum. Musculoskeletal: No acute abnormalities. IMPRESSION: 1. 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 2. Multifocal hepatic metastatic disease which is stable or slightly improved from February 2021. Electronically Signed   By: JMonte FantasiaM.D.   On: 07/10/2019 11:32   DG Abd 2 Views  Result Date: 07/13/2019 CLINICAL DATA:  Status post colonic stent placement EXAM: ABDOMEN - 2 VIEW COMPARISON:  July 10, 2019 FINDINGS: There is an apparent stent in the sigmoid colon region overlying the sacrum. There is no appreciable bowel dilatation or air-fluid level to suggest bowel  obstruction. No free air. Nasogastric tube tip and side port in stomach. There are surgical clips in the upper abdomen. Lung bases are clear. IMPRESSION: Apparent stent in the sigmoid colon region overlying the sacrum. Nasogastric tube tip and side port in stomach. No bowel obstruction or free air. Lung bases clear. Electronically Signed   By: Lowella Grip III M.D.   On: 07/13/2019 11:44   DG C-Arm 1-60 Min-No Report  Result Date: 07/12/2019 Fluoroscopy was utilized by the requesting physician.  No radiographic interpretation.   VAS [place] LOWER EXTREMITY VENOUS (DVT)  Result Date: 07/27/2019  Lower Venous DVTStudy Indications: Swelling.  Risk Factors: None identified. Limitations: Poor ultrasound/tissue interface. Comparison Study: No prior studies. Performing Technologist: Oliver Hum RVT  Examination Guidelines: A complete evaluation includes B-mode  imaging, spectral Doppler, color Doppler, and power Doppler as needed of all accessible portions of each vessel. Bilateral testing is considered an integral part of a complete examination. Limited examinations for reoccurring indications may be performed as noted. The reflux portion of the exam is performed with the patient in reverse Trendelenburg.  +---------+---------------+---------+-----------+----------+--------------+ RIGHT    CompressibilityPhasicitySpontaneityPropertiesThrombus Aging +---------+---------------+---------+-----------+----------+--------------+ CFV      Full           Yes      Yes                                 +---------+---------------+---------+-----------+----------+--------------+ SFJ      Full                                                        +---------+---------------+---------+-----------+----------+--------------+ FV Prox  Full                                                        +---------+---------------+---------+-----------+----------+--------------+ FV Mid   Full                                                        +---------+---------------+---------+-----------+----------+--------------+ FV DistalFull                                                        +---------+---------------+---------+-----------+----------+--------------+ PFV      Full                                                        +---------+---------------+---------+-----------+----------+--------------+ POP      Full           Yes      Yes                                 +---------+---------------+---------+-----------+----------+--------------+  PTV      Full                                                        +---------+---------------+---------+-----------+----------+--------------+ PERO     Full                                                        +---------+---------------+---------+-----------+----------+--------------+    +----+---------------+---------+-----------+----------+--------------+ LEFTCompressibilityPhasicitySpontaneityPropertiesThrombus Aging +----+---------------+---------+-----------+----------+--------------+ CFV Full           Yes      Yes                                 +----+---------------+---------+-----------+----------+--------------+     Summary: RIGHT: - There is no evidence of deep vein thrombosis in the lower extremity.  - No cystic structure found in the popliteal fossa.  LEFT: - No evidence of common femoral vein obstruction.  *See table(s) above for measurements and observations. Electronically signed by Deitra Mayo MD on 07/27/2019 at 6:22:06 AM.    Final    ECHOCARDIOGRAM LIMITED  Result Date: 07/17/2019    ECHOCARDIOGRAM LIMITED REPORT   Patient Name:   Zaeem Kandel. Date of Exam: 07/17/2019 Medical Rec #:  [id number]          Height:       74.0 in Accession #:    [id number]         Weight:       218.9 lb Date of Birth:  [birth year]           BSA:          2.259 m Patient Age:    26 years           BP:           117/54 mmHg Patient Gender: M                  HR:           98 bpm. Exam Location:  Inpatient Procedure: Limited Echo, Intracardiac Opacification Agent, Color Doppler and            Cardiac Doppler Indications:    Congestive Heart Failure 428.0 / I50.9  History:        Patient has prior history of Echocardiogram examinations, most                 recent 04/28/2019. CAD, Prior CABG; Risk Factors:Diabetes.                 Malignant neoplasm of sigmoid colon, history of throat cancer.  Sonographer:    Darlina Sicilian RDCS Referring Phys: [id number] Nadean Corwin A [place]  1. Left ventricular ejection fraction, by estimation, is 55-60%. The left ventricle has normal function. Left ventricular endocardial border not optimally defined to evaluate regional wall motion even with definity contrast. There is severe left ventricular hypertrophy of the basal-septal segment.  Left ventricular diastolic parameters are indeterminate.  2. Right ventricular systolic function was not well visualized. The right ventricular size is normal. There is normal pulmonary artery systolic pressure. The estimated  right ventricular systolic pressure is 75.4 mmHg.  3. The mitral valve is normal in structure. No evidence of mitral valve regurgitation. No evidence of mitral stenosis.  4. The aortic valve is tricuspid. Aortic valve regurgitation is trivial. Mild aortic valve sclerosis is present, with no evidence of aortic valve stenosis.  5. Aortic dilatation noted. There is mild dilatation at the level of the sinuses of Valsalva measuring 43 mm.  6. The inferior vena cava is dilated in size with <50% respiratory variability, suggesting right atrial pressure of 15 mmHg. FINDINGS  Left Ventricle: Left ventricular ejection fraction, by estimation, is 55 to 60%. The left ventricle has normal function. Left ventricular endocardial border not optimally defined to evaluate regional wall motion. The left ventricular internal cavity size was normal in size. There is severe left ventricular hypertrophy of the basal-septal segment. Right Ventricle: The right ventricular size is normal. No increase in right ventricular wall thickness. Right ventricular systolic function was not well visualized. There is normal pulmonary artery systolic pressure. The tricuspid regurgitant velocity is  1.36 m/s, and with an assumed right atrial pressure of 15 mmHg, the estimated right ventricular systolic pressure is 49.2 mmHg. Left Atrium: Left atrial size was normal in size. Right Atrium: Right atrial size was normal in size. Pericardium: There is no evidence of pericardial effusion. Mitral Valve: The mitral valve is normal in structure. Normal mobility of the mitral valve leaflets. No evidence of mitral valve stenosis. Tricuspid Valve: The tricuspid valve is normal in structure. Tricuspid valve regurgitation is trivial. No evidence  of tricuspid stenosis. Aortic Valve: The aortic valve is tricuspid. Aortic valve regurgitation is trivial. Mild aortic valve sclerosis is present, with no evidence of aortic valve stenosis. Pulmonic Valve: The pulmonic valve was normal in structure. Pulmonic valve regurgitation is not visualized. No evidence of pulmonic stenosis. Aorta: Aortic dilatation noted. There is mild dilatation at the level of the sinuses of Valsalva measuring 43 mm. Venous: The inferior vena cava is dilated in size with less than 50% respiratory variability, suggesting right atrial pressure of 15 mmHg. IAS/Shunts: No atrial level shunt detected by color flow Doppler.  LEFT VENTRICLE PLAX 2D LVIDd:         4.30 cm LVIDs:         3.55 cm LV PW:         1.05 cm LV IVS:        1.80 cm LVOT diam:     2.20 cm LV SV:         48 LV SV Index:   21 LVOT Area:     3.80 cm  LV Volumes (MOD) LV vol d, MOD A2C: 82.9 ml LV vol d, MOD A4C: 173.0 ml LV vol s, MOD A2C: 56.7 ml LV vol s, MOD A4C: 88.6 ml LV SV MOD A2C:     26.2 ml LV SV MOD A4C:     173.0 ml LV SV MOD BP:      51.9 ml LEFT ATRIUM         Index LA diam:    2.40 cm 1.06 cm/m  AORTIC VALVE LVOT Vmax:   75.00 cm/s LVOT Vmean:  57.000 cm/s LVOT VTI:    0.126 m  AORTA Ao Root diam: 4.30 cm MITRAL VALVE               TRICUSPID VALVE MV Area (PHT): 4.96 cm    TR Peak grad:   7.4 mmHg MV Decel Time: 153 msec  TR Vmax:        136.00 cm/s MV E velocity: 95.20 cm/s                            SHUNTS                            Systemic VTI:  0.13 m                            Systemic Diam: 2.20 cm Fransico Him MD Electronically signed by Fransico Him MD Signature Date/Time: 07/17/2019/10:43:14 AM    Final     ASSESSMENT AND PLAN: 1. 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 Colonoscopy 03/07/2019-nearly completely obstructing mass beginning at 16 cm from the anal verge, sigmoid colon polyp ? CTs 03/15/2019-wall thickening of the lower sigmoid colon, low rectal  mass with significant luminal narrowing, diffuse liver metastases, borderline enlarged sigmoid mesocolon, iliac, and upper abdominal lymph nodes ? Elevated CEA ? 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 Cycle 1 FOLFOX 04/11/2019 ? Cycle 2 FOLFOX 05/28/2019 ? Cycle 3 FOLFOX 06/11/2019 ? Cycle 4 FOLFOX 06/25/2019 ? CT abdomen/pelvis 07/10/2019-severe obstructing stricture at the sigmoid colon, stable to slight improvement in hepatic metastatic disease compared to February 2021 2. 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 Diabetes 4. Coronary artery disease, status post coronary artery bypass surgery in 2017 5. Neutropenia following cycle 1 FOLFOX 6. Admission 04/28/2019 with COVID-19 infection 7. C. difficile colitis 04/28/2019 8. Atrial flutter during hospital admission March/April 2021-treated with a Cardizem drip, digoxin, and Eliquis anticoagulation. Converted to metoprolol at discharge.  Admission with recurrent rapid atrial fibrillation/flutter 07/09/2019 9. 07/09/2019-hospital admission for bowel obstruction  Sigmoidoscopy 07/12/2019-completely obstructing mass at 17-20 cm proximal to the anus-oozing present, stent placed 10.  Fall 07/24/2019 with acute kidney injury/rhabdomyolysis 11.  Anemia secondary to chronic disease, malnutrition, phlebotomy, GI bleeding  Mr. Mcnee appears stable.  He is scheduled to be discharged to a skilled nursing facility to continue physical therapy.  He has been maintained off of chemotherapy for the past month.  The plan is to complete a cycle of FOLFOX prior to discharge to the skilled nursing facility.  He developed neutropenia following cycle 1 FOLFOX.  He received G-CSF with subsequent cycles.  He will not be able to  receive G-CSF with this cycle of chemotherapy.  I will dose reduce the oxaliplatin.  Recommendations: 1.  Cycle 5 FOLFOX chemotherapy today. 2.  Discharge to skilled nursing facility at the completion of chemotherapy 08/02/2019 3.  Outpatient follow-up and cycle 6 chemotherapy will be scheduled at the Cancer center in 2 weeks.   LOS: 6 days   Betsy Coder 07/31/19

## 2019-07-31 NOTE — Telephone Encounter (Signed)
Scheduled appt per 6/30 sch msg - pt to get an updated schedule with hospital discharge

## 2019-07-31 NOTE — Progress Notes (Signed)
Report called to [name] RN on 6E. Pt ready for transfer to [id number]. Coolidge Breeze, RN 07/31/2019

## 2019-07-31 NOTE — Progress Notes (Addendum)
Ok to use CMET from 07/24/19 and CBC, BMET from 07/31/19 for FOLFOX on 07/31/19 per Dr. [name].  Hardie Pulley, PharmD, BCPS, BCOP

## 2019-07-31 NOTE — Progress Notes (Signed)
Spoke w/ Dr. [name]. Since pegfilgrastim not given inpatient, oxaliplatin will be dose-reduced to 65mg /m2 for this cycle only. No pegfilgrastim this cycle.   Demetrius Charity, PharmD, BCPS, [place] [facility] Pharmacist Pharmacy Phone: [phone] 07/31/2019

## 2019-07-31 NOTE — Progress Notes (Signed)
Pt arrived on unit for chemotherapy. Chemotherapy teaching completed with pt for oxaliplatin and fluorouracil infusion. All questions answered. Pt education hand outs provided as well. Consent obtained, placed in chart. Chemotherapy dosages and calculations reviewed with Adline Peals, RN

## 2019-08-01 DIAGNOSIS — L03115 Cellulitis of right lower limb: Secondary | ICD-10-CM

## 2019-08-01 LAB — GLUCOSE, CAPILLARY
Glucose-Capillary: 338 mg/dL — ABNORMAL HIGH (ref 70–99)
Glucose-Capillary: 279 mg/dL — ABNORMAL HIGH (ref 70–99)
Glucose-Capillary: 302 mg/dL — ABNORMAL HIGH (ref 70–99)

## 2019-08-01 NOTE — Progress Notes (Signed)
Physical Therapy Treatment Patient Details Name: Clarence Andrews. MRN: [id number] DOB: 25-Jan-1951 Today's Date: 08/01/2019    History of Present Illness 69 y.o. male with medical history significant for persistent atrial fibrillation/flutter on digoxin and Lopressor, combined diastolic/systolic CHF , metastatic colon cancer.  Patient was recently hospitalized from 6/8-6/23/2021 for A. fib with RVR, large bowel obstruction 2* colonic mass, orthostatic hypotension. Pt was offered SNF but declined. At home he fell, now admitted with rhabdomyolysis, AKI, cellulitis RLE.    PT Comments    Patient making gradual progress with acute PT. He continues to require Mod assist +2 for sit<>stand transfer with RW for safety. Pt with improved posture and anterior weight shift with VC to prevent post LOB. He continue to have watery BM's and requires Max assist to complete peri-care. Pt performed 2x stand step transfer to move to [facility] and then end session in recliner. Mod assist +2 during gait to recliner required to steady and manage walker position. Pt following cues well this session. He will continue to benefit from skilled PT interventions to address impairments and progress mobility as able.   Follow Up Recommendations  SNF     Equipment Recommendations  None recommended by PT    Recommendations for Other Services       Precautions / Restrictions Precautions Precautions: Fall Precaution Comments: fell at home just prior to this admission    Mobility  Bed Mobility Overal bed mobility: Needs Assistance Bed Mobility: Supine to Sit     Supine to sit: Mod assist;HOB elevated     General bed mobility comments: cues for LE sequencing and assist to bring LE's off EOB fully. cues for press up through Lt elbow and assist to raise trunk from elevated HOB. Mod assist with use of bed pad to scoot to EOB.   Transfers Overall transfer level: Needs assistance Equipment used: Rolling walker (2  wheeled) Transfers: Sit to/from [facility] Sit to Stand: +2 physical assistance;+2 safety/equipment;From elevated surface;Mod assist Stand pivot transfers: Mod assist;+2 physical assistance;+2 safety/equipment;From elevated surface       General transfer comment: VC's provided prior to power up for pt to increase weigth bearing in great toes with standing to facilitate anterior weigth shift and prevent posterior LOB. Pt steadier with rise and no posterior lean. Mod assist +2 required for safety. Pt complete 2x sit<>stand EOB and 2x stand step transfer with RW to Bed>BSC>Recliener. He required Mod assist to steady and cues to sequence foot placement and manage walker during turn.   Ambulation/Gait Ambulation/Gait assistance: Mod assist;+2 safety/equipment Gait Distance (Feet): 10 Feet (4', 6') Assistive device: Rolling walker (2 wheeled) Gait Pattern/deviations: Step-through pattern;Decreased stride length;Decreased step length - right;Decreased step length - left;Shuffle;Trunk flexed;Narrow base of support Gait velocity: decr   General Gait Details: cues for sequencing steps in RW and management of walker. mod assist to steady and prevent LOB.   Stairs        Wheelchair Mobility    Modified Rankin (Stroke Patients Only)       Balance Overall balance assessment: Needs assistance Sitting-balance support: Bilateral upper extremity supported;Feet supported Sitting balance-Leahy Scale: Fair Sitting balance - Comments: pt resting with UE support in sitting on BSC   Standing balance support: Bilateral upper extremity supported;During functional activity Standing balance-Leahy Scale: Poor             Cognition Arousal/Alertness: Awake/alert Behavior During Therapy: WFL for tasks assessed/performed Overall Cognitive Status: Within Functional Limits for tasks assessed  General Comments: pt drowsy and sleeping at start, easily woken and remained alert  during session. pt able to state date and day of wek, oriented to self, situation, and plan to discharge to SNF.       Exercises      General Comments        Pertinent Vitals/Pain Pain Assessment: No/denies pain Pain Location: general, L tknee pain Pain Descriptors / Indicators: Sore           PT Goals (current goals can now be found in the care plan section) Acute Rehab PT Goals Patient Stated Goal: to get stronger at SNF PT Goal Formulation: With patient Time For Goal Achievement: 07/29/19 Potential to Achieve Goals: Fair Progress towards PT goals: Progressing toward goals    Frequency    Min 2X/week      PT Plan Current plan remains appropriate       AM-PAC PT "6 Clicks" Mobility   Outcome Measure  Help needed turning from your back to your side while in a flat bed without using bedrails?: A Lot Help needed moving from lying on your back to sitting on the side of a flat bed without using bedrails?: A Lot Help needed moving to and from a bed to a chair (including a wheelchair)?: A Lot Help needed standing up from a chair using your arms (e.g., wheelchair or bedside chair)?: A Lot Help needed to walk in hospital room?: Total Help needed climbing 3-5 steps with a railing? : Total 6 Click Score: 10    End of Session Equipment Utilized During Treatment: Gait belt Activity Tolerance: Patient tolerated treatment well Patient left: in chair;with call bell/phone within reach;with chair alarm set Nurse Communication: Mobility status;Need for lift equipment;Other (comment) (Stedy with +2 for transfer) PT Visit Diagnosis: Other abnormalities of gait and mobility (R26.89);Muscle weakness (generalized) (M62.81)     Time: 1234-1300 PT Time Calculation (min) (ACUTE ONLY): 26 min  Charges:  $Therapeutic Activity: 23-37 mins                    Verner Mould, DPT Acute Rehabilitation Services  Office [phone] Pager [phone]  08/01/2019 2:37 PM

## 2019-08-01 NOTE — Progress Notes (Signed)
Occupational Therapy Treatment Patient Details Name: Clarence Andrews. MRN: [id number] DOB: 11-02-1950 Today's Date: 08/01/2019    History of present illness 69 y.o. male with medical history significant for persistent atrial fibrillation/flutter on digoxin and Lopressor, combined diastolic/systolic CHF , metastatic colon cancer.  Patient was recently hospitalized from 6/8-6/23/2021 for A. fib with RVR, large bowel obstruction 2* colonic mass, orthostatic hypotension. Pt was offered SNF but declined. At home he fell, now admitted with rhabdomyolysis, AKI, cellulitis RLE.   OT comments  Treatment limited today as patient chose to return to bed mid-transfer when his breakfast tray arrived. Therapist had patient assist with bed mobility and perform grooming task to promote independence with functional tasks. Cont POC.  Follow Up Recommendations  SNF    Equipment Recommendations  None recommended by OT    Recommendations for Other Services      Precautions / Restrictions         Mobility Bed Mobility   Bed Mobility: Supine to Sit     Supine to sit: HOB elevated;Mod assist (Patinet mod assist to begin transfer to EOB. Patient able to move LLE and needing assistance for RLE. Patient stopped mid transfer to return to bed when breakfast tray arrived.)     General bed mobility comments: Patient able to bring feet onto bed and pull on rails to scoot himself up into bed with assistance of therapist pulling on pad.  Transfers                      Balance                                           ADL either performed or assessed with clinical judgement   ADL   Eating/Feeding: Independent;Bed level (Patient able to manage food and containers.)   Grooming: Wash/dry hands;Wash/dry face;Set up;Bed level (Patient washed face and hands supine in bed with HOB elevated.)           Upper Body Dressing : Bed level;Moderate assistance Upper Body Dressing Details  (indicate cue type and reason): mod assist to donn clean hospital gown.                         Vision       Perception     Praxis      Cognition                                                Exercises     Shoulder Instructions       General Comments      Pertinent Vitals/ Pain          Home Living                                          Prior Functioning/Environment              Frequency  Min 2X/week        Progress Toward Goals  OT Goals(current goals can now be found in the care plan section)  Progress towards OT goals: Progressing toward goals  Acute Rehab OT Goals Patient Stated Goal: to get stronger at SNF Time For Goal Achievement: 08/15/19 ADL Goals Pt Will Perform Grooming: sitting;with set-up Pt Will Perform Upper Body Bathing: with set-up;sitting Pt Will Perform Upper Body Dressing: with set-up;sitting Pt Will Transfer to Toilet: with min assist;bedside commode;stand pivot transfer Pt Will Perform Toileting - Clothing Manipulation and hygiene: with min assist;sit to/from stand Additional ADL Goal #1: Patinet will perform UB strengthening exervises with theraband x 20 reps each  Plan Discharge plan remains appropriate    Co-evaluation          OT goals addressed during session: ADL's and self-care;Other (comment) (bed mobility)      AM-PAC OT "6 Clicks" Daily Activity     Outcome Measure   Help from another person eating meals?: None Help from another person taking care of personal grooming?: A Little Help from another person toileting, which includes using toliet, bedpan, or urinal?: Total Help from another person bathing (including washing, rinsing, drying)?: A Lot Help from another person to put on and taking off regular upper body clothing?: A Lot Help from another person to put on and taking off regular lower body clothing?: Total 6 Click Score: 13    End of Session     OT Visit Diagnosis: Unsteadiness on feet (R26.81);Muscle weakness (generalized) (M62.81);Repeated falls (R29.6);History of falling (Z91.81)   Activity Tolerance Patient tolerated treatment well   Patient Left in bed;with call bell/phone within reach   Nurse Communication  (okay to see per RN)        Time: 0981-1914 OT Time Calculation (min): 11 min  Charges: OT General Charges $OT Visit: 1 Visit OT Treatments $Self Care/Home Management : 8-22 mins  Derl Barrow, OTR/[place]  Office [phone] Pager: [place] 08/01/2019, 12:04 PM

## 2019-08-01 NOTE — Care Management Important Message (Signed)
Important Message  Patient Details IM Letter given to Marney Doctor RN Case Manager to present to the Patient Name: Clarence Andrews. MRN: [id number] Date of Birth: 12/09/1950   Medicare Important Message Given:  Yes     Kerin Salen 08/01/2019, 10:00 AM

## 2019-08-01 NOTE — Progress Notes (Signed)
PROGRESS NOTE    Clarence Andrews.  MVH:[id number] DOB: 1950-08-13 DOA: 07/25/2019 PCP: Patient, No Pcp Per   Brief Narrative: 69 y.o.malewith medical history significant forpersistent atrial fibrillation/flutter on digoxin and Lopressor, combined diastolic/systolic CHF, metastatic colon cancer. Patient was recently hospitalized from 6/8-6/23/2021 for A. fib with RVR. Went home despite of being offered SNF. At home he tripped and fell due to weakness, found by the neighbor therefore brought to the hospital. Work-up was negative for trauma but showed UTI and mild rhabdomyolysis.  His urinary tract infection, cellulitis was treated with IV antibiotics.  Seen by PT who recommended SNF.  He has a bed at a skilled nursing facility but since he missed several days/weeks of chemotherapy, his oncologist Dr. Learta Codding wants to start chemotherapy while inpatient.  Plan for discharge to skilled nursing facility on Friday.  Assessment & Plan:   Active Problems:   Cancer of left colon (HCC)   Tachycardia   AKI (acute kidney injury) ([place])   Malignant neoplasm of colon (HCC)   Atrial flutter (HCC)   Atrial fibrillation with RVR (HCC)   Chronic systolic heart failure (HCC)   Orthostatic hypotension   Cellulitis of right leg   Rhabdomyolysis   Unwitnessed fall   Physical deconditioning   Pressure injury of skin   Mechanical fall, generalized weakness PT recommended skilled nursing facility.  Has a bed available at skilled nursing facility  Urinary tract infection with hematuria Urine cultures-Proteus vulgaris.  Improving on cefazolin.  Transitioning to Keflex   Right lower leg nonpurulent cellulitis, improving Erythema and tenderness of the right lower extremity. Transition to Keflex  Right lower extremity Dopplers-negative for DVT  AKI, suspect prerenal.  Baseline creatinine 0.9.  Remained stable  Rhabdomyolysis in the setting of unwitnessed fall.  Resolved with IV  fluids  Atrial fibrillation/flutter, persistent, intermittent RVR. Currently in A. fib, rate controlled. Poor candidate for anticoagulation Continue digoxin p.o. daily, Lopressor 100 mg p.o. twice daily  Orthostatic hypotension, possibly symptomatic Suspect some autonomic dysfunction with a component of dehydration,treated with IV fluids. 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 [name] as outpatient, seen by him during this hospitalization.  He is due for his next cycle, plan is to start chemo here as an inpatient.  Anemia of chronic disease (chemotherapy, colon cancer) Hemoglobin stable, at baseline  Chronic combined CHF, stable.  History of systolic CHF, TTE on most recent admission showed preserved EF. Overall appears to be euvolemic in nature. Resume his home medication including beta-blocker.   Type 2 diabetes, A1c 7.8. Uncontrolled-current acceptable range Resume home regimen upon discharge  CAD status post CABG, stable Currently without any chest pain         DVT prophylaxis:Heparin [place] Code Status: Full Family Communication: None present at the bedside Status is: Inpatient  Remains inpatient appropriate because:IV treatments appropriate due to intensity of illness or inability to take PO   Dispo: The patient is from: Home              Anticipated d/c is to: SNF              Anticipated d/c date is: 1 day              Patient currently is medically stable  to d/c.  Waiting to complete chemo     Consultants: Oncology  Procedures:None  Antimicrobials:  Anti-infectives (From admission, onward)   Start     Dose/Rate Route Frequency Ordered Stop   07/31/19 1200  cephALEXin (KEFLEX) capsule  500 mg     Discontinue     500 mg Oral Every 6 hours 07/31/19 1119 08/02/19 1159   07/29/19 0000  cephALEXin (KEFLEX) 500 MG capsule        500 mg Oral 4 times daily 07/29/19 1216 07/31/19 2359   07/26/19 0815  ceFAZolin (ANCEF) IVPB 1 g/50 mL premix  Status:  Discontinued        1 g 100 mL/hr over 30 Minutes Intravenous Every 8 hours 07/26/19 0804 07/26/19 0809   07/25/19 1545  ceFAZolin (ANCEF) IVPB 1 g/50 mL premix  Status:  Discontinued        1 g 100 mL/hr over 30 Minutes Intravenous Every 8 hours 07/25/19 1530 07/31/19 1119      Subjective: Patient seen and examined at the bedside this morning.  Comfortable.  Hemodynamically stable.  No active issues.  Objective: Vitals:   07/31/19 1416 07/31/19 2103 08/01/19 0604 08/01/19 1454  BP: 136/77 138/76 (!) 151/87 (!) 105/59  Pulse: 68 83 66 60  Resp: 18 16 16 16   Temp: 97.6 F (36.4 C) 98.5 F (36.9 C) 97.8 F (36.6 C) (!) 97.5 F (36.4 C)  TempSrc: Oral Oral Oral Oral  SpO2: 98% 97% 98% 99%  Weight:   95.7 kg   Height:        Intake/Output Summary (Last 24 hours) at 08/01/2019 1506 Last data filed at 08/01/2019 0959 Gross per 24 hour  Intake 382.59 ml  Output 1300 ml  Net -917.41 ml   Filed Weights   07/30/19 0500 07/31/19 0500 08/01/19 0604  Weight: 104 kg 104 kg 95.7 kg    Examination:  General exam: Generalized weakness, debility/deconditioning otherwise comfortable Respiratory system: Bilateral equal air entry, normal vesicular breath sounds, no wheezes or crackles  Cardiovascular system: S1 & S2 heard, RRR. No JVD, murmurs, rubs, gallops or clicks. Gastrointestinal system: Abdomen is nondistended, soft and nontender. No organomegaly or masses felt. Normal bowel sounds heard. Central nervous system: Alert and oriented. No focal neurological deficits. Extremities: No edema, no clubbing ,no cyanosis Skin: No rashes, lesions or ulcers,no icterus ,no pallor  Data Reviewed: I have personally reviewed following labs  and imaging studies  CBC: Recent Labs  Lab 07/27/19 0401 07/28/19 0405 07/29/19 0405 07/30/19 0427 07/31/19 0254  WBC 7.8 7.4 6.1 6.7 6.8  HGB 8.3* 7.9* 8.2* 8.2* 8.8*  HCT 26.3* 25.7* 26.9* 26.9* 28.9*  MCV 91.0 92.4 93.4 91.8 92.0  PLT 269 290 275 267 053   Basic Metabolic Panel: Recent Labs  Lab 07/27/19 0401 07/28/19 0405 07/29/19 0405 07/30/19 0427 07/31/19 0254  NA 134* 137 139 136 138  K 3.5 4.4 4.0 3.9 3.9  CL 101 102 102 99 100  CO2 27 27 29 29 29   GLUCOSE 105* 109* 86 85 82  BUN 21 18 16 17 15   CREATININE 0.94 1.01 0.83 1.03 0.95  CALCIUM 7.7* 8.0* 8.5* 8.3* 8.5*  MG 1.4* 1.6* 1.9 1.7 1.6*   GFR: Estimated Creatinine Clearance: 82.9 mL/min (by C-G formula based on SCr of 0.95 mg/dL). Liver Function Tests: No results for input(s): AST, ALT, ALKPHOS, BILITOT, PROT, ALBUMIN in the last 168 hours. No results for input(s): LIPASE, AMYLASE in the last 168 hours. No results for  input(s): AMMONIA in the last 168 hours. Coagulation Profile: No results for input(s): INR, PROTIME in the last 168 hours. Cardiac Enzymes: Recent Labs  Lab 07/26/19 0322  CKTOTAL 528*   BNP (last 3 results) No results for input(s): PROBNP in the last 8760 hours. HbA1C: No results for input(s): HGBA1C in the last 72 hours. CBG: Recent Labs  Lab 07/31/19 1226 07/31/19 1706 07/31/19 2243 08/01/19 0724 08/01/19 1206  GLUCAP 95 121* 297* 338* 279*   Lipid Profile: No results for input(s): CHOL, HDL, LDLCALC, TRIG, CHOLHDL, LDLDIRECT in the last 72 hours. Thyroid Function Tests: No results for input(s): TSH, T4TOTAL, FREET4, T3FREE, THYROIDAB in the last 72 hours. Anemia Panel: No results for input(s): VITAMINB12, FOLATE, FERRITIN, TIBC, IRON, RETICCTPCT in the last 72 hours. Sepsis Labs: No results for input(s): PROCALCITON, LATICACIDVEN in the last 168 hours.  Recent Results (from the past 240 hour(s))  C Difficile Quick Screen (NO PCR Reflex)     Status: None    Collection Time: 07/23/19  4:06 PM   Specimen: STOOL  Result Value Ref Range Status   C Diff antigen NEGATIVE NEGATIVE Final   C Diff toxin NEGATIVE NEGATIVE Final   C Diff interpretation No C. difficile detected.  Final    Comment: Performed at [facility], [place] [address] [place], [place] [address]  Culture, Urine     Status: Abnormal   Collection Time: 07/25/19 12:13 PM   Specimen: Urine, Random  Result Value Ref Range Status   Specimen Description   Final    URINE, RANDOM Performed at [place] [address] [place], [place] [address]    Special Requests   Final    NONE Performed at [facility], [place] [address] [place], [place] [address]    Culture 30,000 COLONIES/mL PROTEUS VULGARIS (A)  Final   Report Status 07/28/2019 FINAL  Final   Organism ID, Bacteria PROTEUS VULGARIS (A)  Final      Susceptibility   Proteus vulgaris - MIC*    AMPICILLIN >=32 RESISTANT Resistant     CEFAZOLIN >=64 RESISTANT Resistant     CIPROFLOXACIN 0.5 SENSITIVE Sensitive     GENTAMICIN <=1 SENSITIVE Sensitive     IMIPENEM 2 SENSITIVE Sensitive     NITROFURANTOIN 128 RESISTANT Resistant     TRIMETH/SULFA <=20 SENSITIVE Sensitive     AMPICILLIN/SULBACTAM 16 INTERMEDIATE Intermediate     PIP/TAZO <=4 SENSITIVE Sensitive     * 30,000 COLONIES/mL PROTEUS VULGARIS  SARS Coronavirus 2 by RT PCR (hospital order, performed in [place] hospital lab) Nasopharyngeal Nasopharyngeal Swab     Status: None   Collection Time: 07/25/19  2:29 PM   Specimen: Nasopharyngeal Swab  Result Value Ref Range Status   SARS Coronavirus 2 NEGATIVE NEGATIVE Final    Comment: (NOTE) SARS-CoV-2 target nucleic acids are NOT DETECTED.  The SARS-CoV-2 RNA is generally detectable in upper and lower respiratory specimens during the acute phase of infection. The lowest concentration of SARS-CoV-2 viral copies this assay can detect is 250 copies / mL. A negative  result does not preclude SARS-CoV-2 infection and should not be used as the sole basis for treatment or other patient management decisions.  A negative result may occur with improper specimen collection / handling, submission of specimen other than nasopharyngeal swab, presence of viral mutation(s) within the areas targeted by this assay, and inadequate number of viral copies (<250 copies / mL). A negative result must be combined with clinical  observations, patient history, and epidemiological information.  Fact Sheet for Patients:   StrictlyIdeas.no  Fact Sheet for Healthcare Providers: BankingDealers.co.za  This test is not yet approved or  cleared by the [place] FDA and has been authorized for detection and/or diagnosis of SARS-CoV-2 by FDA under an Emergency Use Authorization (EUA).  This EUA will remain in effect (meaning this test can be used) for the duration of the COVID-19 declaration under Section 564(b)(1) of the Act, 21 U.S.C. section 360bbb-3(b)(1), unless the authorization is terminated or revoked sooner.  Performed at [facility], [place] [address] [place], [place] [address]   SARS CORONAVIRUS 2 (TAT 6-24 HRS) Nasopharyngeal Nasopharyngeal Swab     Status: None   Collection Time: 07/28/19  9:42 AM   Specimen: Nasopharyngeal Swab  Result Value Ref Range Status   SARS Coronavirus 2 NEGATIVE NEGATIVE Final    Comment: (NOTE) SARS-CoV-2 target nucleic acids are NOT DETECTED.  The SARS-CoV-2 RNA is generally detectable in upper and lower respiratory specimens during the acute phase of infection. Negative results do not preclude SARS-CoV-2 infection, do not rule out co-infections with other pathogens, and should not be used as the sole basis for treatment or other patient management decisions. Negative results must be combined with clinical observations, patient history, and epidemiological  information. The expected result is Negative.  Fact Sheet for Patients: SugarRoll.be  Fact Sheet for Healthcare Providers: https://www.woods-mathews.com/  This test is not yet approved or cleared by the [place] FDA and  has been authorized for detection and/or diagnosis of SARS-CoV-2 by FDA under an Emergency Use Authorization (EUA). This EUA will remain  in effect (meaning this test can be used) for the duration of the COVID-19 declaration under Se ction 564(b)(1) of the Act, 21 U.S.C. section 360bbb-3(b)(1), unless the authorization is terminated or revoked sooner.  Performed at [facility], [place] [address] [place], [place] [address]          Radiology Studies: No results found.      Scheduled Meds: . cephALEXin  500 mg Oral Q6H  . Chlorhexidine Gluconate Cloth  6 each Topical Daily  . digoxin  0.25 mg Oral Daily  . ferrous sulfate  325 mg Oral Q breakfast  . FLUOROURACIL (ADRUCIL) CHEMO infusion For Inpatient Use  2,000 mg/m2 (Treatment Plan Recorded) Intravenous Once  . heparin  5,000 Units Subcutaneous Q8H  . insulin aspart  0-9 Units Subcutaneous TID WC  . insulin glargine  10 Units Subcutaneous Daily  . magnesium oxide  800 mg Oral Daily  . metoprolol tartrate  100 mg Oral BID  . midodrine  5 mg Oral TID WC  . senna  1 tablet Oral BID  . sodium chloride flush  10-40 mL Intracatheter Q12H   Continuous Infusions:    LOS: 7 days    Time spent: 15 mins. More than 50% of that time was spent in counseling and/or coordination of care.      Shelly Coss, MD [facility] P7/02/2019, 3:06 PM

## 2019-08-01 NOTE — Progress Notes (Addendum)
HEMATOLOGY-ONCOLOGY PROGRESS NOTE  SUBJECTIVE: Cycle 5 of FOLFOX yesterday and so far is tolerating it well.  He denies abdominal pain, nausea, vomiting, constipation, diarrhea.  And to discharge to SNF tomorrow.  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[place])   04/11/2019 -  Chemotherapy   The patient had palonosetron (ALOXI) injection 0.25 mg, 0.25 mg, Intravenous,  Once, 5 of 6 cycles Administration: 0.25 mg (04/11/2019), 0.25 mg (05/28/2019), 0.25 mg (06/11/2019), 0.25 mg (06/25/2019) pegfilgrastim-cbqv (UDENYCA) injection 6 mg, 6 mg, Subcutaneous, Once, 3 of 4 cycles Administration: 6 mg (05/30/2019), 6 mg (06/13/2019), 6 mg (06/27/2019) fluorouracil (ADRUCIL) 4,650 mg in sodium chloride 0.9 % 1,000 mL chemo infusion, 2,000 mg/m2 = 4,650 mg (100 % of original dose 2,000 mg/m2), Intravenous, Once, 1 of 1 cycle Dose modification: 2,000 mg/m2 (original dose 2,000 mg/m2, Cycle 5, Reason: Provider Judgment) leucovorin 972 mg in dextrose 5 % 250 mL infusion, 400 mg/m2 = 972 mg, Intravenous,  Once, 5 of 6 cycles Administration: 972 mg (04/11/2019), 972 mg (05/28/2019), 972 mg (06/11/2019), 972 mg (06/25/2019) oxaliplatin (ELOXATIN) 200 mg in dextrose 5 % 500 mL chemo infusion, 83 mg/m2 = 205 mg, Intravenous,  Once, 5 of 6 cycles Administration: 200 mg (04/11/2019), 200 mg (05/28/2019), 200 mg (06/11/2019), 200 mg (06/25/2019) fluorouracil (ADRUCIL) chemo injection 950 mg, 400 mg/m2 = 950 mg, Intravenous,  Once, 1 of 1 cycle Administration: 950 mg (04/11/2019) fluorouracil (ADRUCIL) 5,850 mg in sodium chloride 0.9 % 133 mL chemo infusion, 2,400 mg/m2 = 5,850 mg, Intravenous, 1 Day/Dose, 5 of 6 cycles Dose modification: 2,000 mg/m2 (original dose 2,400 mg/m2, Cycle 2, Reason: Provider Judgment) Administration: 5,850 mg (04/11/2019), 4,850 mg (05/28/2019), 4,850 mg (06/11/2019), 4,850 mg (06/25/2019)  for chemotherapy treatment.    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[place])   04/11/2019 -  Chemotherapy   The patient had palonosetron (ALOXI) injection 0.25 mg, 0.25 mg, Intravenous,  Once, 5 of 6 cycles Administration: 0.25 mg (04/11/2019), 0.25 mg (05/28/2019), 0.25 mg (06/11/2019), 0.25 mg (06/25/2019) pegfilgrastim-cbqv (UDENYCA) injection 6 mg, 6 mg, Subcutaneous, Once, 3 of 4 cycles Administration: 6 mg (05/30/2019), 6 mg (06/13/2019), 6 mg (06/27/2019) fluorouracil (ADRUCIL) 4,650 mg in sodium chloride 0.9 % 1,000 mL chemo infusion, 2,000 mg/m2 = 4,650 mg (100 % of original dose 2,000 mg/m2), Intravenous, Once, 1 of 1 cycle Dose modification: 2,000 mg/m2 (original dose 2,000 mg/m2, Cycle 5, Reason: Provider Judgment) leucovorin 972 mg in dextrose 5 % 250 mL infusion, 400 mg/m2 = 972 mg, Intravenous,  Once, 5 of 6 cycles Administration: 972 mg (04/11/2019), 972 mg (05/28/2019), 972 mg (06/11/2019), 972 mg (06/25/2019) oxaliplatin (ELOXATIN) 200 mg in dextrose 5 % 500 mL chemo infusion, 83 mg/m2 = 205 mg, Intravenous,  Once, 5 of 6 cycles Administration: 200 mg (04/11/2019), 200 mg (05/28/2019), 200 mg (06/11/2019), 200 mg (06/25/2019) fluorouracil (ADRUCIL) chemo injection 950 mg, 400 mg/m2 = 950 mg, Intravenous,  Once, 1 of 1 cycle Administration: 950 mg (04/11/2019) fluorouracil (ADRUCIL) 5,850 mg in sodium chloride 0.9 % 133 mL chemo infusion, 2,400 mg/m2 = 5,850 mg, Intravenous, 1 Day/Dose, 5 of 6 cycles Dose modification: 2,000 mg/m2 (original dose 2,400 mg/m2, Cycle 2, Reason: Provider Judgment) Administration: 5,850 mg (04/11/2019), 4,850 mg (05/28/2019), 4,850 mg (06/11/2019), 4,850 mg (06/25/2019)  for chemotherapy treatment.      PHYSICAL EXAMINATION:  Vitals:   07/31/19 2103 08/01/19 0604  BP:  138/76 (!) 151/87  Pulse: 83 66  Resp: 16 16  Temp: 98.5 F (36.9 C) 97.8 F (36.6 C)  SpO2: 97% 98%   Filed Weights   07/30/19 0500 07/31/19 0500 08/01/19 0604  Weight: 104 kg 104  kg 95.7 kg    Intake/Output from previous day: 06/30 0701 - 07/01 0700 In: 142.6 [I.V.:21.8; IV Piggyback:120.8] Out: 1725 [Urine:1725]   HEENT: No thrush or ulcers  HEART: Regular rhythm ABDOMEN:abdomen soft, non-tender, no hepatomegaly NEURO: alert & oriented x 3 with fluent speech Skin: Abrasions at the right lower leg, resolving blisters at the lower leg bilaterally  LABORATORY DATA:  I have reviewed the data as listed CMP Latest Ref Rng & Units 07/31/2019 07/30/2019 07/29/2019  Glucose 70 - 99 mg/dL 82 85 86  BUN 8 - 23 mg/dL '15 17 16  '$ Creatinine 0.61 - 1.24 mg/dL 0.95 1.03 0.83  Sodium 135 - 145 mmol/L 138 136 139  Potassium 3.5 - 5.1 mmol/L 3.9 3.9 4.0  Chloride 98 - 111 mmol/L 100 99 102  CO2 22 - 32 mmol/L '29 29 29  '$ Calcium 8.9 - 10.3 mg/dL 8.5(L) 8.3(L) 8.5(L)  Total Protein 6.5 - 8.1 g/dL - - -  Total Bilirubin 0.3 - 1.2 mg/dL - - -  Alkaline Phos 38 - 126 U/L - - -  AST 15 - 41 U/L - - -  ALT 0 - 44 U/L - - -    Lab Results  Component Value Date   WBC 6.8 07/31/2019   HGB 8.8 (L) 07/31/2019   HCT 28.9 (L) 07/31/2019   MCV 92.0 07/31/2019   PLT 253 07/31/2019   NEUTROABS 13.8 (H) 07/25/2019    DG Chest 1 View  Result Date: 07/09/2019 CLINICAL DATA:  Tachycardia and diffuse abdominal pain. EXAM: CHEST  1 VIEW COMPARISON:  04/28/2019 FINDINGS: Previous median sternotomy. Power port inserted from a right jugular approach with tip at the SVC RA junction. Heart size is upper limits of normal with some left ventricular prominence. No acute mediastinal finding. The pulmonary vascularity is normal. The lungs are clear. No infiltrate, collapse or effusion. IMPRESSION: No active disease. Electronically Signed   By: Nelson Chimes M.D.   On: 07/09/2019 14:21   DG Lumbar Spine Complete  Result Date: 07/25/2019 CLINICAL DATA:  Low back pain after fall. EXAM: LUMBAR SPINE - COMPLETE 4+ VIEW COMPARISON:  07/13/2019. FINDINGS: Diffuse multilevel degenerative change. No acute  bony abnormality identified. No evidence of fracture. Rectosigmoid stent noted. IMPRESSION: 1. Diffuse multilevel degenerative change. No acute bony abnormality. 2.  Rectosigmoid stent again noted. Electronically Signed   By: Marcello Moores  Register   On: 07/25/2019 13:14   DG Tibia/Fibula Right  Result Date: 07/25/2019 CLINICAL DATA:  Fall right lower leg injury EXAM: RIGHT TIBIA AND FIBULA - 2 VIEW COMPARISON:  None. FINDINGS: Tiny ossific linear density is seen adjacent to the lateral femoral condyle which could represent a tiny chip fracture. Fragmented enthesophytes seen at the anterior tibial tubercle. Mild overlying soft tissue swelling seen around the lateral aspect of the knee. IMPRESSION: Tiny linear density seen adjacent to the lateral femoral condyle with overlying soft tissue swelling which could represent a tiny chip fracture. Please correlate with the patient's site of pain. Electronically Signed   By: Prudencio Pair M.D.   On: 07/25/2019 11:28   DG Ankle Complete Right  Result Date: 07/25/2019 CLINICAL DATA:  Patient had a mechanical fall last night around 10pm, tripped over a canister, states he is weak  because the hospital would not let him walk. Reports R knee and R ankle pain. Weeping and skin tears noted to most of right tib/fib .*comment was truncated*fell, R lower leg injury EXAM: RIGHT ANKLE - COMPLETE 3+ VIEW COMPARISON:  None. FINDINGS: Ankle mortise intact. The talar dome is normal. No malleolar fracture. The calcaneus is normal. Enthesopathic spurring of the calcaneus. IMPRESSION: No fracture or dislocation. Electronically Signed   By: Suzy Bouchard M.D.   On: 07/25/2019 11:31   DG Abd 1 View  Result Date: 07/10/2019 CLINICAL DATA:  Nasogastric tube placement. EXAM: ABDOMEN - 1 VIEW COMPARISON:  July 09, 2019 (1:53 p.m.) FINDINGS: Multiple sternal wires and sternal fixation plates and screws are seen. A nasogastric tube is seen with its distal tip overlying the body of the stomach.  Numerous dilated small bowel loops are again seen throughout the abdomen and pelvis. No radio-opaque calculi or other significant radiographic abnormality are seen. IMPRESSION: Nasogastric tube positioning, as described above. Electronically Signed   By: Virgina Norfolk M.D.   On: 07/10/2019 17:56   DG Abdomen 1 View  Result Date: 07/09/2019 CLINICAL DATA:  Tachycardia.  Diffuse abdominal pain. EXAM: ABDOMEN - 1 VIEW COMPARISON:  None. FINDINGS: There is gas throughout small and large bowel most consistent with ileus. No focal dilatation to suggest obstruction. No free air. No abnormal calcifications. Ordinary degenerative changes affect the spine. IMPRESSION: Possible ileus pattern. No evidence of frank obstruction or focal lesion. Electronically Signed   By: Nelson Chimes M.D.   On: 07/09/2019 14:22   CT ABDOMEN PELVIS W CONTRAST  Result Date: 07/10/2019 CLINICAL DATA:  Rectal tumor. Abdominal distention. No bowel movement for days. EXAM: CT ABDOMEN AND PELVIS WITH CONTRAST TECHNIQUE: Multidetector CT imaging of the abdomen and pelvis was performed using the standard protocol following bolus administration of intravenous contrast. CONTRAST:  121m OMNIPAQUE IOHEXOL 300 MG/ML  SOLN COMPARISON:  03/15/2019 FINDINGS: Lower chest: Atelectasis and patchy airspace opacity in the lower lungs. Atherosclerosis and postoperative heart. Hepatobiliary: Multifocal hepatic metastatic disease with ill-defined masses measuring up to 4 cm in the posterior right liver, roughly 5 mm smaller than the before. Evidence of biliary obstruction or stone. Pancreas: Diffuse fatty atrophy with some preservation of density at the tail. There could be a cyst at the uncinate process, incidental in this setting. Spleen: Unremarkable. Adrenals/Urinary Tract: Negative adrenals. No hydronephrosis or stone. Simple bilateral renal cysts. Unremarkable bladder. Stomach/Bowel: Known sigmoid mass with severe luminal stenosis. The adjacent wall  thickening is less thickened, but there is still spiculated density extending through the serosa into the mesentery. Above this narrowing there is generalized gas and fluid dilated bowel. Vascular/Lymphatic: No acute vascular finding. Decrease in size of IMA lymph nodes. Reproductive:No pathologic findings. Other: No ascites or pneumoperitoneum. Musculoskeletal: No acute abnormalities. IMPRESSION: 1. 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 2. Multifocal hepatic metastatic disease which is stable or slightly improved from February 2021. Electronically Signed   By: JMonte FantasiaM.D.   On: 07/10/2019 11:32   DG Abd 2 Views  Result Date: 07/13/2019 CLINICAL DATA:  Status post colonic stent placement EXAM: ABDOMEN - 2 VIEW COMPARISON:  July 10, 2019 FINDINGS: There is an apparent stent in the sigmoid colon region overlying the sacrum. There is no appreciable bowel dilatation or air-fluid level to suggest bowel obstruction. No free air. Nasogastric tube tip and side port in stomach. There are surgical  clips in the upper abdomen. Lung bases are clear. IMPRESSION: Apparent stent in the sigmoid colon region overlying the sacrum. Nasogastric tube tip and side port in stomach. No bowel obstruction or free air. Lung bases clear. Electronically Signed   By: Lowella Grip III M.D.   On: 07/13/2019 11:44   DG C-Arm 1-60 Min-No Report  Result Date: 07/12/2019 Fluoroscopy was utilized by the requesting physician.  No radiographic interpretation.   VAS [place] LOWER EXTREMITY VENOUS (DVT)  Result Date: 07/27/2019  Lower Venous DVTStudy Indications: Swelling.  Risk Factors: None identified. Limitations: Poor ultrasound/tissue interface. Comparison Study: No prior studies. Performing Technologist: Oliver Hum RVT  Examination Guidelines: A complete evaluation includes B-mode imaging, spectral Doppler, color  Doppler, and power Doppler as needed of all accessible portions of each vessel. Bilateral testing is considered an integral part of a complete examination. Limited examinations for reoccurring indications may be performed as noted. The reflux portion of the exam is performed with the patient in reverse Trendelenburg.  +---------+---------------+---------+-----------+----------+--------------+ RIGHT    CompressibilityPhasicitySpontaneityPropertiesThrombus Aging +---------+---------------+---------+-----------+----------+--------------+ CFV      Full           Yes      Yes                                 +---------+---------------+---------+-----------+----------+--------------+ SFJ      Full                                                        +---------+---------------+---------+-----------+----------+--------------+ FV Prox  Full                                                        +---------+---------------+---------+-----------+----------+--------------+ FV Mid   Full                                                        +---------+---------------+---------+-----------+----------+--------------+ FV DistalFull                                                        +---------+---------------+---------+-----------+----------+--------------+ PFV      Full                                                        +---------+---------------+---------+-----------+----------+--------------+ POP      Full           Yes      Yes                                 +---------+---------------+---------+-----------+----------+--------------+  PTV      Full                                                        +---------+---------------+---------+-----------+----------+--------------+ PERO     Full                                                        +---------+---------------+---------+-----------+----------+--------------+    +----+---------------+---------+-----------+----------+--------------+ LEFTCompressibilityPhasicitySpontaneityPropertiesThrombus Aging +----+---------------+---------+-----------+----------+--------------+ CFV Full           Yes      Yes                                 +----+---------------+---------+-----------+----------+--------------+     Summary: RIGHT: - There is no evidence of deep vein thrombosis in the lower extremity.  - No cystic structure found in the popliteal fossa.  LEFT: - No evidence of common femoral vein obstruction.  *See table(s) above for measurements and observations. Electronically signed by Deitra Mayo MD on 07/27/2019 at 6:22:06 AM.    Final    ECHOCARDIOGRAM LIMITED  Result Date: 07/17/2019    ECHOCARDIOGRAM LIMITED REPORT   Patient Name:   Clarence Andrews. Date of Exam: 07/17/2019 Medical Rec #:  [id number]          Height:       74.0 in Accession #:    [id number]         Weight:       218.9 lb Date of Birth:  [birth year]           BSA:          2.259 m Patient Age:    26 years           BP:           117/54 mmHg Patient Gender: M                  HR:           98 bpm. Exam Location:  Inpatient Procedure: Limited Echo, Intracardiac Opacification Agent, Color Doppler and            Cardiac Doppler Indications:    Congestive Heart Failure 428.0 / I50.9  History:        Patient has prior history of Echocardiogram examinations, most                 recent 04/28/2019. CAD, Prior CABG; Risk Factors:Diabetes.                 Malignant neoplasm of sigmoid colon, history of throat cancer.  Sonographer:    Darlina Sicilian RDCS Referring Phys: [id number] Nadean Corwin A [place]  1. Left ventricular ejection fraction, by estimation, is 55-60%. The left ventricle has normal function. Left ventricular endocardial border not optimally defined to evaluate regional wall motion even with definity contrast. There is severe left ventricular hypertrophy of the basal-septal segment.  Left ventricular diastolic parameters are indeterminate.  2. Right ventricular systolic function was not well visualized. The right ventricular size is normal. There is normal pulmonary artery systolic pressure. The estimated  right ventricular systolic pressure is 75.4 mmHg.  3. The mitral valve is normal in structure. No evidence of mitral valve regurgitation. No evidence of mitral stenosis.  4. The aortic valve is tricuspid. Aortic valve regurgitation is trivial. Mild aortic valve sclerosis is present, with no evidence of aortic valve stenosis.  5. Aortic dilatation noted. There is mild dilatation at the level of the sinuses of Valsalva measuring 43 mm.  6. The inferior vena cava is dilated in size with <50% respiratory variability, suggesting right atrial pressure of 15 mmHg. FINDINGS  Left Ventricle: Left ventricular ejection fraction, by estimation, is 55 to 60%. The left ventricle has normal function. Left ventricular endocardial border not optimally defined to evaluate regional wall motion. The left ventricular internal cavity size was normal in size. There is severe left ventricular hypertrophy of the basal-septal segment. Right Ventricle: The right ventricular size is normal. No increase in right ventricular wall thickness. Right ventricular systolic function was not well visualized. There is normal pulmonary artery systolic pressure. The tricuspid regurgitant velocity is  1.36 m/s, and with an assumed right atrial pressure of 15 mmHg, the estimated right ventricular systolic pressure is 49.2 mmHg. Left Atrium: Left atrial size was normal in size. Right Atrium: Right atrial size was normal in size. Pericardium: There is no evidence of pericardial effusion. Mitral Valve: The mitral valve is normal in structure. Normal mobility of the mitral valve leaflets. No evidence of mitral valve stenosis. Tricuspid Valve: The tricuspid valve is normal in structure. Tricuspid valve regurgitation is trivial. No evidence  of tricuspid stenosis. Aortic Valve: The aortic valve is tricuspid. Aortic valve regurgitation is trivial. Mild aortic valve sclerosis is present, with no evidence of aortic valve stenosis. Pulmonic Valve: The pulmonic valve was normal in structure. Pulmonic valve regurgitation is not visualized. No evidence of pulmonic stenosis. Aorta: Aortic dilatation noted. There is mild dilatation at the level of the sinuses of Valsalva measuring 43 mm. Venous: The inferior vena cava is dilated in size with less than 50% respiratory variability, suggesting right atrial pressure of 15 mmHg. IAS/Shunts: No atrial level shunt detected by color flow Doppler.  LEFT VENTRICLE PLAX 2D LVIDd:         4.30 cm LVIDs:         3.55 cm LV PW:         1.05 cm LV IVS:        1.80 cm LVOT diam:     2.20 cm LV SV:         48 LV SV Index:   21 LVOT Area:     3.80 cm  LV Volumes (MOD) LV vol d, MOD A2C: 82.9 ml LV vol d, MOD A4C: 173.0 ml LV vol s, MOD A2C: 56.7 ml LV vol s, MOD A4C: 88.6 ml LV SV MOD A2C:     26.2 ml LV SV MOD A4C:     173.0 ml LV SV MOD BP:      51.9 ml LEFT ATRIUM         Index LA diam:    2.40 cm 1.06 cm/m  AORTIC VALVE LVOT Vmax:   75.00 cm/s LVOT Vmean:  57.000 cm/s LVOT VTI:    0.126 m  AORTA Ao Root diam: 4.30 cm MITRAL VALVE               TRICUSPID VALVE MV Area (PHT): 4.96 cm    TR Peak grad:   7.4 mmHg MV Decel Time: 153 msec  TR Vmax:        136.00 cm/s MV E velocity: 95.20 cm/s                            SHUNTS                            Systemic VTI:  0.13 m                            Systemic Diam: 2.20 cm Fransico Him MD Electronically signed by Fransico Him MD Signature Date/Time: 07/17/2019/10:43:14 AM    Final     ASSESSMENT AND PLAN: 1. 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 Colonoscopy 03/07/2019-nearly completely obstructing mass beginning at 16 cm from the anal verge, sigmoid colon polyp ? CTs 03/15/2019-wall thickening of the lower sigmoid colon, low rectal  mass with significant luminal narrowing, diffuse liver metastases, borderline enlarged sigmoid mesocolon, iliac, and upper abdominal lymph nodes ? Elevated CEA ? 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 Cycle 1 FOLFOX 04/11/2019 ? Cycle 2 FOLFOX 05/28/2019 ? Cycle 3 FOLFOX 06/11/2019 ? Cycle 4 FOLFOX 06/25/2019 ? CT abdomen/pelvis 07/10/2019-severe obstructing stricture at the sigmoid colon, stable to slight improvement in hepatic metastatic disease compared to February 2021 ? Cycle 5 FOLFOX 07/31/2019 2. 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 Diabetes 4. Coronary artery disease, status post coronary artery bypass surgery in 2017 5. Neutropenia following cycle 1 FOLFOX 6. Admission 04/28/2019 with COVID-19 infection 7. C. difficile colitis 04/28/2019 8. Atrial flutter during hospital admission March/April 2021-treated with a Cardizem drip, digoxin, and Eliquis anticoagulation. Converted to metoprolol at discharge.  Admission with recurrent rapid atrial fibrillation/flutter 07/09/2019 9. 07/09/2019-hospital admission for bowel obstruction  Sigmoidoscopy 07/12/2019-completely obstructing mass at 17-20 cm proximal to the anus-oozing present, stent placed 10.  Fall 07/24/2019 with acute kidney injury/rhabdomyolysis 11.  Anemia secondary to chronic disease, malnutrition, phlebotomy, GI bleeding  Clarence Andrews appears stable.  Tolerating cycle 5 of FOLFOX well overall.  Recommend for him to continue 5-FU infusion with plan to complete on 08/02/2019.  Oxaliplatin was dose reduced with this cycle since he will not be able to receive G-CSF in the hospital.  Will discharge to SNF tomorrow after the completion of his chemotherapy.  Recommendations: 1.  Continue cycle 5  FOLFOX 2.  Discharge to skilled nursing facility at the completion of chemotherapy 08/02/2019 3.  Outpatient follow-up and cycle 6 chemotherapy will be scheduled at the Cancer center in 2 weeks.   LOS: 7 days   Mikey Bussing 08/01/19 Clarence Andrews was interviewed and examined.  He tolerated the day 1 FOLFOX without significant acute toxicity.  The heart rate was regular when I saw him this morning.  He will complete the 5-FU infusion over the next 2 days.  He will not receive G-CSF support with this cycle.  He knows to call for fever or symptoms of an infection.

## 2019-08-02 ENCOUNTER — Ambulatory Visit: Payer: Medicare HMO | Admitting: Cardiovascular Disease

## 2019-08-02 DIAGNOSIS — K529 Noninfective gastroenteritis and colitis, unspecified: Secondary | ICD-10-CM | POA: Diagnosis not present

## 2019-08-02 DIAGNOSIS — N179 Acute kidney failure, unspecified: Secondary | ICD-10-CM | POA: Diagnosis not present

## 2019-08-02 DIAGNOSIS — N390 Urinary tract infection, site not specified: Secondary | ICD-10-CM | POA: Diagnosis not present

## 2019-08-02 DIAGNOSIS — B3741 Candidal cystitis and urethritis: Secondary | ICD-10-CM | POA: Diagnosis not present

## 2019-08-02 DIAGNOSIS — I4892 Unspecified atrial flutter: Secondary | ICD-10-CM | POA: Diagnosis not present

## 2019-08-02 DIAGNOSIS — E1141 Type 2 diabetes mellitus with diabetic mononeuropathy: Secondary | ICD-10-CM | POA: Diagnosis present

## 2019-08-02 DIAGNOSIS — Z7401 Bed confinement status: Secondary | ICD-10-CM | POA: Diagnosis not present

## 2019-08-02 DIAGNOSIS — T451X5A Adverse effect of antineoplastic and immunosuppressive drugs, initial encounter: Secondary | ICD-10-CM | POA: Diagnosis present

## 2019-08-02 DIAGNOSIS — R197 Diarrhea, unspecified: Secondary | ICD-10-CM | POA: Diagnosis not present

## 2019-08-02 DIAGNOSIS — I4819 Other persistent atrial fibrillation: Secondary | ICD-10-CM | POA: Diagnosis not present

## 2019-08-02 DIAGNOSIS — L89510 Pressure ulcer of right ankle, unstageable: Secondary | ICD-10-CM | POA: Diagnosis not present

## 2019-08-02 DIAGNOSIS — I5042 Chronic combined systolic (congestive) and diastolic (congestive) heart failure: Secondary | ICD-10-CM | POA: Diagnosis not present

## 2019-08-02 DIAGNOSIS — B3749 Other urogenital candidiasis: Secondary | ICD-10-CM | POA: Diagnosis present

## 2019-08-02 DIAGNOSIS — Z85038 Personal history of other malignant neoplasm of large intestine: Secondary | ICD-10-CM | POA: Diagnosis not present

## 2019-08-02 DIAGNOSIS — J020 Streptococcal pharyngitis: Secondary | ICD-10-CM | POA: Diagnosis not present

## 2019-08-02 DIAGNOSIS — I4891 Unspecified atrial fibrillation: Secondary | ICD-10-CM | POA: Diagnosis not present

## 2019-08-02 DIAGNOSIS — Z8616 Personal history of COVID-19: Secondary | ICD-10-CM | POA: Diagnosis not present

## 2019-08-02 DIAGNOSIS — L89520 Pressure ulcer of left ankle, unstageable: Secondary | ICD-10-CM | POA: Diagnosis not present

## 2019-08-02 DIAGNOSIS — I482 Chronic atrial fibrillation, unspecified: Secondary | ICD-10-CM | POA: Diagnosis not present

## 2019-08-02 DIAGNOSIS — Z23 Encounter for immunization: Secondary | ICD-10-CM | POA: Diagnosis not present

## 2019-08-02 DIAGNOSIS — Z20822 Contact with and (suspected) exposure to covid-19: Secondary | ICD-10-CM | POA: Diagnosis not present

## 2019-08-02 DIAGNOSIS — D849 Immunodeficiency, unspecified: Secondary | ICD-10-CM | POA: Diagnosis not present

## 2019-08-02 DIAGNOSIS — C19 Malignant neoplasm of rectosigmoid junction: Secondary | ICD-10-CM | POA: Diagnosis not present

## 2019-08-02 DIAGNOSIS — I959 Hypotension, unspecified: Secondary | ICD-10-CM | POA: Diagnosis not present

## 2019-08-02 DIAGNOSIS — D72829 Elevated white blood cell count, unspecified: Secondary | ICD-10-CM | POA: Diagnosis not present

## 2019-08-02 DIAGNOSIS — W010XXA Fall on same level from slipping, tripping and stumbling without subsequent striking against object, initial encounter: Secondary | ICD-10-CM | POA: Diagnosis not present

## 2019-08-02 DIAGNOSIS — I2510 Atherosclerotic heart disease of native coronary artery without angina pectoris: Secondary | ICD-10-CM | POA: Diagnosis not present

## 2019-08-02 DIAGNOSIS — J029 Acute pharyngitis, unspecified: Secondary | ICD-10-CM | POA: Diagnosis not present

## 2019-08-02 DIAGNOSIS — I951 Orthostatic hypotension: Secondary | ICD-10-CM | POA: Diagnosis not present

## 2019-08-02 DIAGNOSIS — C187 Malignant neoplasm of sigmoid colon: Secondary | ICD-10-CM | POA: Diagnosis not present

## 2019-08-02 DIAGNOSIS — Z951 Presence of aortocoronary bypass graft: Secondary | ICD-10-CM | POA: Diagnosis not present

## 2019-08-02 DIAGNOSIS — D010 Carcinoma in situ of colon: Secondary | ICD-10-CM | POA: Diagnosis not present

## 2019-08-02 DIAGNOSIS — C186 Malignant neoplasm of descending colon: Secondary | ICD-10-CM | POA: Diagnosis not present

## 2019-08-02 DIAGNOSIS — I483 Typical atrial flutter: Secondary | ICD-10-CM | POA: Diagnosis not present

## 2019-08-02 DIAGNOSIS — D701 Agranulocytosis secondary to cancer chemotherapy: Secondary | ICD-10-CM | POA: Diagnosis not present

## 2019-08-02 DIAGNOSIS — E869 Volume depletion, unspecified: Secondary | ICD-10-CM | POA: Diagnosis present

## 2019-08-02 DIAGNOSIS — C787 Secondary malignant neoplasm of liver and intrahepatic bile duct: Secondary | ICD-10-CM | POA: Diagnosis not present

## 2019-08-02 DIAGNOSIS — R531 Weakness: Secondary | ICD-10-CM | POA: Diagnosis not present

## 2019-08-02 DIAGNOSIS — E46 Unspecified protein-calorie malnutrition: Secondary | ICD-10-CM | POA: Diagnosis not present

## 2019-08-02 DIAGNOSIS — Z5111 Encounter for antineoplastic chemotherapy: Secondary | ICD-10-CM | POA: Diagnosis not present

## 2019-08-02 DIAGNOSIS — Z8371 Family history of colonic polyps: Secondary | ICD-10-CM | POA: Diagnosis not present

## 2019-08-02 DIAGNOSIS — Z79899 Other long term (current) drug therapy: Secondary | ICD-10-CM | POA: Diagnosis not present

## 2019-08-02 DIAGNOSIS — Z20828 Contact with and (suspected) exposure to other viral communicable diseases: Secondary | ICD-10-CM | POA: Diagnosis not present

## 2019-08-02 DIAGNOSIS — S80819A Abrasion, unspecified lower leg, initial encounter: Secondary | ICD-10-CM | POA: Diagnosis not present

## 2019-08-02 DIAGNOSIS — A0471 Enterocolitis due to Clostridium difficile, recurrent: Secondary | ICD-10-CM | POA: Diagnosis not present

## 2019-08-02 DIAGNOSIS — Z8619 Personal history of other infectious and parasitic diseases: Secondary | ICD-10-CM | POA: Diagnosis not present

## 2019-08-02 DIAGNOSIS — Z85810 Personal history of malignant neoplasm of tongue: Secondary | ICD-10-CM | POA: Diagnosis not present

## 2019-08-02 DIAGNOSIS — M6282 Rhabdomyolysis: Secondary | ICD-10-CM | POA: Diagnosis not present

## 2019-08-02 DIAGNOSIS — Z955 Presence of coronary angioplasty implant and graft: Secondary | ICD-10-CM | POA: Diagnosis not present

## 2019-08-02 DIAGNOSIS — E119 Type 2 diabetes mellitus without complications: Secondary | ICD-10-CM | POA: Diagnosis not present

## 2019-08-02 DIAGNOSIS — K219 Gastro-esophageal reflux disease without esophagitis: Secondary | ICD-10-CM | POA: Diagnosis not present

## 2019-08-02 DIAGNOSIS — L89151 Pressure ulcer of sacral region, stage 1: Secondary | ICD-10-CM | POA: Diagnosis present

## 2019-08-02 DIAGNOSIS — Z8589 Personal history of malignant neoplasm of other organs and systems: Secondary | ICD-10-CM | POA: Diagnosis not present

## 2019-08-02 DIAGNOSIS — Z1589 Genetic susceptibility to other disease: Secondary | ICD-10-CM | POA: Diagnosis not present

## 2019-08-02 DIAGNOSIS — R5381 Other malaise: Secondary | ICD-10-CM | POA: Diagnosis not present

## 2019-08-02 DIAGNOSIS — I479 Paroxysmal tachycardia, unspecified: Secondary | ICD-10-CM | POA: Diagnosis not present

## 2019-08-02 DIAGNOSIS — U071 COVID-19: Secondary | ICD-10-CM | POA: Diagnosis not present

## 2019-08-02 DIAGNOSIS — L03116 Cellulitis of left lower limb: Secondary | ICD-10-CM | POA: Diagnosis not present

## 2019-08-02 DIAGNOSIS — I5022 Chronic systolic (congestive) heart failure: Secondary | ICD-10-CM | POA: Diagnosis not present

## 2019-08-02 DIAGNOSIS — M2550 Pain in unspecified joint: Secondary | ICD-10-CM | POA: Diagnosis not present

## 2019-08-02 LAB — GLUCOSE, CAPILLARY
Glucose-Capillary: 171 mg/dL — ABNORMAL HIGH (ref 70–99)
Glucose-Capillary: 206 mg/dL — ABNORMAL HIGH (ref 70–99)
Glucose-Capillary: 178 mg/dL — ABNORMAL HIGH (ref 70–99)
Glucose-Capillary: 135 mg/dL — ABNORMAL HIGH (ref 70–99)

## 2019-08-02 MED ORDER — HEPARIN SOD (PORK) LOCK FLUSH 100 UNIT/ML IV SOLN
500.0000 [IU] | Freq: Once | INTRAVENOUS | Status: AC
  Administered 2019-08-02: 500 [IU] via INTRAVENOUS
  Filled 2019-08-02: qty 5

## 2019-08-02 NOTE — Progress Notes (Signed)
Report given to Janett Billow RN at [facility].

## 2019-08-02 NOTE — TOC Transition Note (Signed)
Transition of Care [facility]) - CM/SW Discharge Note   Patient Details  Name: Abdikadir Fohl. MRN: [id number] Date of Birth: [birth year]  [facility]) CM/SW Contact:  Bhavana Kady, Marjie Skiff, RN Phone Number: 08/02/2019, 12:40 PM   Clinical Narrative:     Pt to dc to [facility] today. PTAR to be set up for transport after chemo is completed. RN to call report to  [phone]. DC summary sent via hub.    Barriers to Discharge: Continued Medical Work up   Patient Goals and CMS Choice Patient states their goals for this hospitalization and ongoing recovery are:: agreeable now with SNF CMS Medicare.gov Compare Post Acute Care list provided to:: Patient Choice offered to / list presented to : Patient Discharge Plan and Services In-house Referral: Clinical Social Work   Post Acute Care Choice: [place]

## 2019-08-02 NOTE — Progress Notes (Signed)
[facility] here to take patient to snf, all belongings and discharge paperwork sent, dentures and cell phone wrapped up in patient shirt in the belongings bag, vss, yellow socks applied per patient request, denies any pain at this time.

## 2019-08-02 NOTE — Progress Notes (Signed)
Patient seen and examined at the bedside this morning.  There was concern for bradycardia.  EKG confirmed normal heart rate with PVCs.  He is hemodynamically stable for discharge to skilled facility today .Discharge orders and summary already in place.

## 2019-08-05 DIAGNOSIS — K219 Gastro-esophageal reflux disease without esophagitis: Secondary | ICD-10-CM | POA: Diagnosis not present

## 2019-08-05 DIAGNOSIS — D010 Carcinoma in situ of colon: Secondary | ICD-10-CM | POA: Diagnosis not present

## 2019-08-05 DIAGNOSIS — I479 Paroxysmal tachycardia, unspecified: Secondary | ICD-10-CM | POA: Diagnosis not present

## 2019-08-05 DIAGNOSIS — N179 Acute kidney failure, unspecified: Secondary | ICD-10-CM | POA: Diagnosis not present

## 2019-08-11 ENCOUNTER — Other Ambulatory Visit: Payer: Self-pay | Admitting: Oncology

## 2019-08-13 ENCOUNTER — Inpatient Hospital Stay: Payer: Medicare HMO

## 2019-08-13 ENCOUNTER — Other Ambulatory Visit: Payer: Self-pay

## 2019-08-13 ENCOUNTER — Inpatient Hospital Stay: Payer: Medicare HMO | Attending: Oncology | Admitting: Nurse Practitioner

## 2019-08-13 ENCOUNTER — Encounter: Payer: Self-pay | Admitting: Nurse Practitioner

## 2019-08-13 VITALS — BP 90/62 | HR 73 | Temp 97.3°F | Resp 16 | Ht 74.0 in

## 2019-08-13 DIAGNOSIS — E119 Type 2 diabetes mellitus without complications: Secondary | ICD-10-CM | POA: Diagnosis not present

## 2019-08-13 DIAGNOSIS — C787 Secondary malignant neoplasm of liver and intrahepatic bile duct: Secondary | ICD-10-CM | POA: Insufficient documentation

## 2019-08-13 DIAGNOSIS — Z8616 Personal history of COVID-19: Secondary | ICD-10-CM | POA: Insufficient documentation

## 2019-08-13 DIAGNOSIS — C187 Malignant neoplasm of sigmoid colon: Secondary | ICD-10-CM

## 2019-08-13 DIAGNOSIS — C186 Malignant neoplasm of descending colon: Secondary | ICD-10-CM

## 2019-08-13 DIAGNOSIS — Z95828 Presence of other vascular implants and grafts: Secondary | ICD-10-CM | POA: Insufficient documentation

## 2019-08-13 DIAGNOSIS — I2510 Atherosclerotic heart disease of native coronary artery without angina pectoris: Secondary | ICD-10-CM | POA: Insufficient documentation

## 2019-08-13 DIAGNOSIS — I4892 Unspecified atrial flutter: Secondary | ICD-10-CM | POA: Diagnosis not present

## 2019-08-13 DIAGNOSIS — Z85810 Personal history of malignant neoplasm of tongue: Secondary | ICD-10-CM | POA: Insufficient documentation

## 2019-08-13 DIAGNOSIS — D701 Agranulocytosis secondary to cancer chemotherapy: Secondary | ICD-10-CM | POA: Insufficient documentation

## 2019-08-13 DIAGNOSIS — Z5111 Encounter for antineoplastic chemotherapy: Secondary | ICD-10-CM | POA: Insufficient documentation

## 2019-08-13 DIAGNOSIS — C19 Malignant neoplasm of rectosigmoid junction: Secondary | ICD-10-CM | POA: Diagnosis not present

## 2019-08-13 DIAGNOSIS — U071 COVID-19: Secondary | ICD-10-CM | POA: Insufficient documentation

## 2019-08-13 DIAGNOSIS — Z1589 Genetic susceptibility to other disease: Secondary | ICD-10-CM | POA: Diagnosis not present

## 2019-08-13 LAB — CMP (CANCER CENTER ONLY)
ALT: 11 U/L (ref 0–44)
AST: 18 U/L (ref 15–41)
Albumin: 2.8 g/dL — ABNORMAL LOW (ref 3.5–5.0)
Alkaline Phosphatase: 160 U/L — ABNORMAL HIGH (ref 38–126)
Anion gap: 12 (ref 5–15)
BUN: 23 mg/dL (ref 8–23)
CO2: 26 mmol/L (ref 22–32)
Calcium: 9.5 mg/dL (ref 8.9–10.3)
Chloride: 103 mmol/L (ref 98–111)
Creatinine: 1.32 mg/dL — ABNORMAL HIGH (ref 0.61–1.24)
GFR, Est AFR Am: >60 mL/min (ref >60)
GFR, Estimated: 54 mL/min — ABNORMAL LOW (ref >60)
Glucose, Bld: 164 mg/dL — ABNORMAL HIGH (ref 70–99)
Potassium: 4 mmol/L (ref 3.5–5.1)
Sodium: 141 mmol/L (ref 135–145)
Total Bilirubin: 0.6 mg/dL (ref 0.3–1.2)
Total Protein: 7 g/dL (ref 6.5–8.1)

## 2019-08-13 LAB — CBC WITH DIFFERENTIAL (CANCER CENTER ONLY)
Abs Immature Granulocytes: 0.01 10*3/uL (ref 0.00–0.07)
Basophils Absolute: 0 10*3/uL (ref 0.0–0.1)
Basophils Relative: 1 %
Eosinophils Absolute: 0.2 10*3/uL (ref 0.0–0.5)
Eosinophils Relative: 3 %
HCT: 30.9 % — ABNORMAL LOW (ref 39.0–52.0)
Hemoglobin: 9.7 g/dL — ABNORMAL LOW (ref 13.0–17.0)
Immature Granulocytes: 0 %
Lymphocytes Relative: 16 %
Lymphs Abs: 0.9 10*3/uL (ref 0.7–4.0)
MCH: 28.8 pg (ref 26.0–34.0)
MCHC: 31.4 g/dL (ref 30.0–36.0)
MCV: 91.7 fL (ref 80.0–100.0)
Monocytes Absolute: 0.5 10*3/uL (ref 0.1–1.0)
Monocytes Relative: 9 %
Neutro Abs: 4.3 10*3/uL (ref 1.7–7.7)
Neutrophils Relative %: 71 %
Platelet Count: 155 10*3/uL (ref 150–400)
RBC: 3.37 MIL/uL — ABNORMAL LOW (ref 4.22–5.81)
RDW: 15.8 % — ABNORMAL HIGH (ref 11.5–15.5)
WBC Count: 5.9 10*3/uL (ref 4.0–10.5)
nRBC: 0 % (ref 0.0–0.2)

## 2019-08-13 LAB — CEA (IN HOUSE-CHCC): CEA (CHCC-In House): 90.58 ng/mL — ABNORMAL HIGH (ref 0.00–5.00)

## 2019-08-13 MED ORDER — PALONOSETRON HCL INJECTION 0.25 MG/5ML
0.2500 mg | Freq: Once | INTRAVENOUS | Status: AC
  Administered 2019-08-13: 0.25 mg via INTRAVENOUS

## 2019-08-13 MED ORDER — SODIUM CHLORIDE 0.9 % IV SOLN
10.0000 mg | Freq: Once | INTRAVENOUS | Status: AC
  Administered 2019-08-13: 10 mg via INTRAVENOUS
  Filled 2019-08-13: qty 10

## 2019-08-13 MED ORDER — SODIUM CHLORIDE 0.9 % IV SOLN
2000.0000 mg/m2 | INTRAVENOUS | Status: DC
  Administered 2019-08-13: 4650 mg via INTRAVENOUS
  Filled 2019-08-13: qty 93

## 2019-08-13 MED ORDER — SODIUM CHLORIDE 0.9% FLUSH
10.0000 mL | Freq: Once | INTRAVENOUS | Status: AC
  Administered 2019-08-13: 10 mL
  Filled 2019-08-13: qty 10

## 2019-08-13 MED ORDER — LEUCOVORIN CALCIUM INJECTION 350 MG
400.0000 mg/m2 | Freq: Once | INTRAVENOUS | Status: AC
  Administered 2019-08-13: 932 mg via INTRAVENOUS
  Filled 2019-08-13: qty 46.6

## 2019-08-13 MED ORDER — OXALIPLATIN CHEMO INJECTION 100 MG/20ML
65.0000 mg/m2 | Freq: Once | INTRAVENOUS | Status: AC
  Administered 2019-08-13: 150 mg via INTRAVENOUS
  Filled 2019-08-13: qty 20

## 2019-08-13 MED ORDER — DEXTROSE 5 % IV SOLN
Freq: Once | INTRAVENOUS | Status: AC
  Filled 2019-08-13: qty 250

## 2019-08-13 MED ORDER — PALONOSETRON HCL INJECTION 0.25 MG/5ML
INTRAVENOUS | Status: AC
  Filled 2019-08-13: qty 5

## 2019-08-13 NOTE — Progress Notes (Signed)
[facility] OFFICE PROGRESS NOTE   Diagnosis: Colon cancer  INTERVAL HISTORY:   Mr. Burnsworth returns as scheduled.  He completed cycle 5 FOLFOX 07/31/2019.  He denies nausea/vomiting.  No mouth sores.  No diarrhea.  He denies cold sensitivity.  No numbness or tingling in the hands or feet.  No lightheadedness or dizziness.  He is residing at a nursing facility.  He reports he is participating in physical therapy.  He is able to get up with a walker, sounds like he requires standby assistance.  He is feeding himself.  He requires assistance with bathing/grooming.  He reports normal blood pressure readings at the nursing facility.  Objective:  Vital signs in last 24 hours:  Blood pressure 90/62, pulse 73, temperature (!) 97.3 F (36.3 C), temperature source Temporal, resp. rate 16, height 6' 2" (1.88 m), SpO2 100 %.    HEENT: No thrush or ulcers. Resp: Lungs clear bilaterally. Cardio: Heart sounds are distant, slightly irregular, rate within normal limits. GI: Abdomen soft and nontender.  No hepatomegaly. Vascular: No leg edema. Port-A-Cath without erythema.  Lab Results:  Lab Results  Component Value Date   WBC 5.9 08/13/2019   HGB 9.7 (L) 08/13/2019   HCT 30.9 (L) 08/13/2019   MCV 91.7 08/13/2019   PLT 155 08/13/2019   NEUTROABS 4.3 08/13/2019    Imaging:  No results found.  Medications: I have reviewed the patient's current medications.  Assessment/Plan: 1. 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 Colonoscopy 03/07/2019-nearly completely obstructing mass beginning at 16 cm from the anal verge, sigmoid colon polyp ? CTs 03/15/2019-wall thickening of the lower sigmoid colon, low rectal mass with significant luminal narrowing, diffuse liver metastases, borderline enlarged sigmoid mesocolon, iliac, and upper abdominal lymph nodes ? Elevated CEA ? Biopsy liver lesion 03/29/2019-metastatic adenocarcinoma to liver consistent  with clinical impression of colorectal primary.MSS, tumor mutation burden-3, K-ras G12D, PIK3CA mutations ? Cycle 1 FOLFOX 04/11/2019 ? Cycle 2 FOLFOX 05/28/2019 ? Cycle 3 FOLFOX 06/11/2019 ? Cycle 4 FOLFOX 06/25/2019 ? CT abdomen/pelvis 07/10/2019-severe obstructing stricture at the sigmoid colon, stable to slight improvement in hepatic metastatic disease compared to February 2021 ? Cycle 5 FOLFOX 07/31/2019 2. 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 Diabetes 4. Coronary artery disease, status post coronary artery bypass surgery in 2017 5. Neutropenia following cycle 1 FOLFOX 6. Admission 04/28/2019 with COVID-19 infection 7. C. difficile colitis 04/28/2019 8. Atrial flutter during hospital admission March/April 2021-treated with a Cardizem drip, digoxin, and Eliquis anticoagulation. Converted to metoprolol at discharge.  Admission with recurrent rapid atrial fibrillation/flutter 07/09/2019 9. 07/09/2019-hospital admission for bowel obstruction  Sigmoidoscopy 07/12/2019-completely obstructing mass at 17-20 cm proximal to the anus-oozing present, stent placed 10.  Fall 07/24/2019 with acute kidney injury/rhabdomyolysis 11.  Anemia secondary to chronic disease, malnutrition, phlebotomy, GI bleeding 12.  Orthostatic hypotension-on midodrine 3 times daily.  Disposition: Clarence Andrews appears stable.  He has completed 5 cycles of FOLFOX.  Plan to proceed with cycle 6 today as scheduled.  We reviewed the CBC and chemistry panel from today.  Labs are adequate to proceed with treatment.  Creatinine is mildly elevated.  He will receive additional IV fluids on day of pump discontinuation, 08/15/2019.  He will return for lab, follow-up, FOLFOX in 2 weeks.  Plan reviewed with Dr. [name].    Ned Card ANP/GNP-BC   08/13/2019  9:49 AM

## 2019-08-13 NOTE — Patient Instructions (Signed)
[facility] Discharge Instructions for Patients Receiving Chemotherapy  Today you received the following chemotherapy agents oxaliplatin/leucovorin/florouracil   To help prevent nausea and vomiting after your treatment, we encourage you to take your nausea medication as directed.   If you develop nausea and vomiting that is not controlled by your nausea medication, call the clinic.   BELOW ARE SYMPTOMS THAT SHOULD BE REPORTED IMMEDIATELY:  *FEVER GREATER THAN 100.5 F  *CHILLS WITH OR WITHOUT FEVER  NAUSEA AND VOMITING THAT IS NOT CONTROLLED WITH YOUR NAUSEA MEDICATION  *UNUSUAL SHORTNESS OF BREATH  *UNUSUAL BRUISING OR BLEEDING  TENDERNESS IN MOUTH AND THROAT WITH OR WITHOUT PRESENCE OF ULCERS  *URINARY PROBLEMS  *BOWEL PROBLEMS  UNUSUAL RASH Items with * indicate a potential emergency and should be followed up as soon as possible.  Feel free to call the clinic you have any questions or concerns. The clinic phone number is [phone].  

## 2019-08-14 ENCOUNTER — Telehealth: Payer: Self-pay | Admitting: Nurse Practitioner

## 2019-08-14 NOTE — Telephone Encounter (Signed)
scheduled per 7/13 los. Unable to reach pt with appts added on 7/27 and 7/29. Noted to give pt updated appt calendar on next visit

## 2019-08-15 ENCOUNTER — Other Ambulatory Visit: Payer: Self-pay

## 2019-08-15 ENCOUNTER — Inpatient Hospital Stay: Payer: Medicare HMO

## 2019-08-15 ENCOUNTER — Other Ambulatory Visit: Payer: Self-pay | Admitting: Nurse Practitioner

## 2019-08-15 VITALS — BP 99/57 | HR 66 | Temp 97.7°F | Resp 16

## 2019-08-15 DIAGNOSIS — Z85810 Personal history of malignant neoplasm of tongue: Secondary | ICD-10-CM | POA: Diagnosis not present

## 2019-08-15 DIAGNOSIS — Z5111 Encounter for antineoplastic chemotherapy: Secondary | ICD-10-CM | POA: Diagnosis not present

## 2019-08-15 DIAGNOSIS — I4892 Unspecified atrial flutter: Secondary | ICD-10-CM | POA: Diagnosis not present

## 2019-08-15 DIAGNOSIS — C19 Malignant neoplasm of rectosigmoid junction: Secondary | ICD-10-CM | POA: Diagnosis not present

## 2019-08-15 DIAGNOSIS — I2510 Atherosclerotic heart disease of native coronary artery without angina pectoris: Secondary | ICD-10-CM | POA: Diagnosis not present

## 2019-08-15 DIAGNOSIS — C187 Malignant neoplasm of sigmoid colon: Secondary | ICD-10-CM

## 2019-08-15 DIAGNOSIS — C186 Malignant neoplasm of descending colon: Secondary | ICD-10-CM

## 2019-08-15 DIAGNOSIS — C787 Secondary malignant neoplasm of liver and intrahepatic bile duct: Secondary | ICD-10-CM | POA: Diagnosis not present

## 2019-08-15 DIAGNOSIS — E119 Type 2 diabetes mellitus without complications: Secondary | ICD-10-CM | POA: Diagnosis not present

## 2019-08-15 DIAGNOSIS — D701 Agranulocytosis secondary to cancer chemotherapy: Secondary | ICD-10-CM | POA: Diagnosis not present

## 2019-08-15 DIAGNOSIS — Z1589 Genetic susceptibility to other disease: Secondary | ICD-10-CM | POA: Diagnosis not present

## 2019-08-15 MED ORDER — ACETAMINOPHEN 325 MG PO TABS: 650.0000 mg | ORAL_TABLET | Freq: Once | ORAL | Status: DC

## 2019-08-15 MED ORDER — HEPARIN SOD (PORK) LOCK FLUSH 100 UNIT/ML IV SOLN
500.0000 [IU] | Freq: Once | INTRAVENOUS | Status: AC | PRN
  Administered 2019-08-15: 500 [IU]
  Filled 2019-08-15: qty 5

## 2019-08-15 MED ORDER — PEGFILGRASTIM-CBQV 6 MG/0.6ML ~~LOC~~ SOSY
6.0000 mg | PREFILLED_SYRINGE | Freq: Once | SUBCUTANEOUS | Status: AC
  Administered 2019-08-15: 6 mg via SUBCUTANEOUS

## 2019-08-15 MED ORDER — ACETAMINOPHEN 325 MG PO TABS
ORAL_TABLET | ORAL | Status: AC
  Filled 2019-08-15: qty 2

## 2019-08-15 MED ORDER — SODIUM CHLORIDE 0.9% FLUSH
10.0000 mL | INTRAVENOUS | Status: DC | PRN
  Administered 2019-08-15: 10 mL
  Filled 2019-08-15: qty 10

## 2019-08-15 MED ORDER — SODIUM CHLORIDE 0.9 % IV SOLN
INTRAVENOUS | Status: DC
  Filled 2019-08-15 (×2): qty 250

## 2019-08-15 MED ORDER — PEGFILGRASTIM-CBQV 6 MG/0.6ML ~~LOC~~ SOSY
PREFILLED_SYRINGE | SUBCUTANEOUS | Status: AC
  Filled 2019-08-15: qty 0.6

## 2019-08-15 NOTE — Patient Instructions (Signed)

## 2019-08-19 ENCOUNTER — Encounter: Payer: Self-pay | Admitting: *Deleted

## 2019-08-19 DIAGNOSIS — J029 Acute pharyngitis, unspecified: Secondary | ICD-10-CM | POA: Diagnosis not present

## 2019-08-19 NOTE — Progress Notes (Signed)
Faxed office note, consultation sheet and appointment calendar to [facility].

## 2019-08-20 DIAGNOSIS — I482 Chronic atrial fibrillation, unspecified: Secondary | ICD-10-CM | POA: Diagnosis not present

## 2019-08-20 DIAGNOSIS — N179 Acute kidney failure, unspecified: Secondary | ICD-10-CM | POA: Diagnosis not present

## 2019-08-20 DIAGNOSIS — R5381 Other malaise: Secondary | ICD-10-CM | POA: Diagnosis not present

## 2019-08-20 DIAGNOSIS — I5022 Chronic systolic (congestive) heart failure: Secondary | ICD-10-CM | POA: Diagnosis not present

## 2019-08-20 DIAGNOSIS — J020 Streptococcal pharyngitis: Secondary | ICD-10-CM | POA: Diagnosis not present

## 2019-08-25 ENCOUNTER — Other Ambulatory Visit: Payer: Self-pay | Admitting: Oncology

## 2019-08-27 ENCOUNTER — Inpatient Hospital Stay: Payer: Medicare HMO

## 2019-08-27 ENCOUNTER — Encounter: Payer: Self-pay | Admitting: Nurse Practitioner

## 2019-08-27 ENCOUNTER — Inpatient Hospital Stay (HOSPITAL_BASED_OUTPATIENT_CLINIC_OR_DEPARTMENT_OTHER): Payer: Medicare HMO | Admitting: Nurse Practitioner

## 2019-08-27 ENCOUNTER — Other Ambulatory Visit: Payer: Self-pay

## 2019-08-27 VITALS — BP 102/58 | HR 93 | Temp 97.7°F | Resp 18 | Ht 74.0 in | Wt 191.8 lb

## 2019-08-27 DIAGNOSIS — C187 Malignant neoplasm of sigmoid colon: Secondary | ICD-10-CM

## 2019-08-27 DIAGNOSIS — C186 Malignant neoplasm of descending colon: Secondary | ICD-10-CM

## 2019-08-27 DIAGNOSIS — Z5111 Encounter for antineoplastic chemotherapy: Secondary | ICD-10-CM | POA: Diagnosis not present

## 2019-08-27 DIAGNOSIS — D701 Agranulocytosis secondary to cancer chemotherapy: Secondary | ICD-10-CM | POA: Diagnosis not present

## 2019-08-27 DIAGNOSIS — C19 Malignant neoplasm of rectosigmoid junction: Secondary | ICD-10-CM | POA: Diagnosis not present

## 2019-08-27 DIAGNOSIS — Z1589 Genetic susceptibility to other disease: Secondary | ICD-10-CM | POA: Diagnosis not present

## 2019-08-27 DIAGNOSIS — I2510 Atherosclerotic heart disease of native coronary artery without angina pectoris: Secondary | ICD-10-CM | POA: Diagnosis not present

## 2019-08-27 DIAGNOSIS — C787 Secondary malignant neoplasm of liver and intrahepatic bile duct: Secondary | ICD-10-CM | POA: Diagnosis not present

## 2019-08-27 DIAGNOSIS — E119 Type 2 diabetes mellitus without complications: Secondary | ICD-10-CM | POA: Diagnosis not present

## 2019-08-27 DIAGNOSIS — Z85810 Personal history of malignant neoplasm of tongue: Secondary | ICD-10-CM | POA: Diagnosis not present

## 2019-08-27 DIAGNOSIS — I4892 Unspecified atrial flutter: Secondary | ICD-10-CM | POA: Diagnosis not present

## 2019-08-27 DIAGNOSIS — Z95828 Presence of other vascular implants and grafts: Secondary | ICD-10-CM

## 2019-08-27 LAB — CMP (CANCER CENTER ONLY)
ALT: 15 U/L (ref 0–44)
AST: 24 U/L (ref 15–41)
Albumin: 2.7 g/dL — ABNORMAL LOW (ref 3.5–5.0)
Alkaline Phosphatase: 186 U/L — ABNORMAL HIGH (ref 38–126)
Anion gap: 15 (ref 5–15)
BUN: 21 mg/dL (ref 8–23)
CO2: 24 mmol/L (ref 22–32)
Calcium: 9.6 mg/dL (ref 8.9–10.3)
Chloride: 102 mmol/L (ref 98–111)
Creatinine: 1.38 mg/dL — ABNORMAL HIGH (ref 0.61–1.24)
GFR, Est AFR Am: 59 mL/min — ABNORMAL LOW (ref >60)
GFR, Estimated: 51 mL/min — ABNORMAL LOW (ref >60)
Glucose, Bld: 136 mg/dL — ABNORMAL HIGH (ref 70–99)
Potassium: 3.9 mmol/L (ref 3.5–5.1)
Sodium: 141 mmol/L (ref 135–145)
Total Bilirubin: 0.7 mg/dL (ref 0.3–1.2)
Total Protein: 6.7 g/dL (ref 6.5–8.1)

## 2019-08-27 LAB — CBC WITH DIFFERENTIAL (CANCER CENTER ONLY)
Abs Immature Granulocytes: 0.09 10*3/uL — ABNORMAL HIGH (ref 0.00–0.07)
Basophils Absolute: 0.1 10*3/uL (ref 0.0–0.1)
Basophils Relative: 1 %
Eosinophils Absolute: 0.1 10*3/uL (ref 0.0–0.5)
Eosinophils Relative: 1 %
HCT: 33.3 % — ABNORMAL LOW (ref 39.0–52.0)
Hemoglobin: 10.3 g/dL — ABNORMAL LOW (ref 13.0–17.0)
Immature Granulocytes: 1 %
Lymphocytes Relative: 16 %
Lymphs Abs: 1.8 10*3/uL (ref 0.7–4.0)
MCH: 27.8 pg (ref 26.0–34.0)
MCHC: 30.9 g/dL (ref 30.0–36.0)
MCV: 89.8 fL (ref 80.0–100.0)
Monocytes Absolute: 1.3 10*3/uL — ABNORMAL HIGH (ref 0.1–1.0)
Monocytes Relative: 12 %
Neutro Abs: 7.5 10*3/uL (ref 1.7–7.7)
Neutrophils Relative %: 69 %
Platelet Count: 202 10*3/uL (ref 150–400)
RBC: 3.71 MIL/uL — ABNORMAL LOW (ref 4.22–5.81)
RDW: 15.8 % — ABNORMAL HIGH (ref 11.5–15.5)
WBC Count: 10.9 10*3/uL — ABNORMAL HIGH (ref 4.0–10.5)
nRBC: 0 % (ref 0.0–0.2)

## 2019-08-27 LAB — CEA (IN HOUSE-CHCC): CEA (CHCC-In House): 89.63 ng/mL — ABNORMAL HIGH (ref 0.00–5.00)

## 2019-08-27 MED ORDER — SODIUM CHLORIDE 0.9% FLUSH
10.0000 mL | Freq: Once | INTRAVENOUS | Status: AC
  Administered 2019-08-27: 10 mL
  Filled 2019-08-27: qty 10

## 2019-08-27 MED ORDER — OXALIPLATIN CHEMO INJECTION 100 MG/20ML
65.0000 mg/m2 | Freq: Once | INTRAVENOUS | Status: AC
  Administered 2019-08-27: 150 mg via INTRAVENOUS
  Filled 2019-08-27: qty 20

## 2019-08-27 MED ORDER — LEUCOVORIN CALCIUM INJECTION 350 MG
400.0000 mg/m2 | Freq: Once | INTRAVENOUS | Status: AC
  Administered 2019-08-27: 932 mg via INTRAVENOUS
  Filled 2019-08-27: qty 46.6

## 2019-08-27 MED ORDER — PALONOSETRON HCL INJECTION 0.25 MG/5ML
0.2500 mg | Freq: Once | INTRAVENOUS | Status: AC
  Administered 2019-08-27: 0.25 mg via INTRAVENOUS

## 2019-08-27 MED ORDER — PALONOSETRON HCL INJECTION 0.25 MG/5ML
INTRAVENOUS | Status: AC
  Filled 2019-08-27: qty 5

## 2019-08-27 MED ORDER — DEXTROSE 5 % IV SOLN
Freq: Once | INTRAVENOUS | Status: AC
  Filled 2019-08-27: qty 250

## 2019-08-27 MED ORDER — SODIUM CHLORIDE 0.9 % IV SOLN
2000.0000 mg/m2 | INTRAVENOUS | Status: DC
  Administered 2019-08-27: 4650 mg via INTRAVENOUS
  Filled 2019-08-27: qty 93

## 2019-08-27 MED ORDER — SODIUM CHLORIDE 0.9 % IV SOLN
10.0000 mg | Freq: Once | INTRAVENOUS | Status: AC
  Administered 2019-08-27: 10 mg via INTRAVENOUS
  Filled 2019-08-27: qty 10

## 2019-08-27 NOTE — Patient Instructions (Signed)
[facility] Discharge Instructions for Patients Receiving Chemotherapy  Today you received the following chemotherapy agents oxaliplatin/leucovorin/florouracil   To help prevent nausea and vomiting after your treatment, we encourage you to take your nausea medication as directed.   If you develop nausea and vomiting that is not controlled by your nausea medication, call the clinic.   BELOW ARE SYMPTOMS THAT SHOULD BE REPORTED IMMEDIATELY:  *FEVER GREATER THAN 100.5 F  *CHILLS WITH OR WITHOUT FEVER  NAUSEA AND VOMITING THAT IS NOT CONTROLLED WITH YOUR NAUSEA MEDICATION  *UNUSUAL SHORTNESS OF BREATH  *UNUSUAL BRUISING OR BLEEDING  TENDERNESS IN MOUTH AND THROAT WITH OR WITHOUT PRESENCE OF ULCERS  *URINARY PROBLEMS  *BOWEL PROBLEMS  UNUSUAL RASH Items with * indicate a potential emergency and should be followed up as soon as possible.  Feel free to call the clinic you have any questions or concerns. The clinic phone number is [phone].  

## 2019-08-27 NOTE — Progress Notes (Addendum)
[facility] OFFICE PROGRESS NOTE   Diagnosis:  Colon cancer  INTERVAL HISTORY:   Mr. Jarnigan returns as scheduled. He completed cycle six  FOLFOX 08/13/2019. He denies nausea/vomiting. No mouth sores. He estimates 2-3 loose stools a day. No numbness or tingling in the hands or feet. He states he is hungry but does not like the food served at the facility. He reports good fluid intake. He feels weak and is concerned his blood pressure is low.  He reports trouble walking due to right foot weakness.  Objective:  Vital signs in last 24 hours:  Blood pressure (!) 102/58, pulse 93, temperature 97.7 F (36.5 C), temperature source Temporal, resp. rate 18, height _0  (1.88 m), SpO2 96 %.    HEENT: No thrush or ulcers. Mucous membranes appear moist. Resp: Lungs clear bilaterally. Cardio: Regular rate and rhythm. GI: Abdomen soft and nontender. No hepatomegaly. Vascular: No leg edema. Neuro: Right foot drop.  Lower extremity strength otherwise appears intact. Skin: Mild decrease in skin turgor. Port-A-Cath without erythema.   Lab Results:  Lab Results  Component Value Date   WBC 10.9 (H) 08/27/2019   HGB 10.3 (L) 08/27/2019   HCT 33.3 (L) 08/27/2019   MCV 89.8 08/27/2019   PLT 202 08/27/2019   NEUTROABS 7.5 08/27/2019    Imaging:  No results found.  Medications: I have reviewed the patient's current medications.  Assessment/Plan: 1. 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 Colonoscopy 03/07/2019-nearly completely obstructing mass beginning at 16 cm from the anal verge, sigmoid colon polyp ? CTs 03/15/2019-wall thickening of the lower sigmoid colon, low rectal mass with significant luminal narrowing, diffuse liver metastases, borderline enlarged sigmoid mesocolon, iliac, and upper abdominal lymph nodes ? Elevated CEA ? Biopsy liver lesion 03/29/2019-metastatic adenocarcinoma to liver consistent with clinical impression of  colorectal primary.MSS, tumor mutation burden-3, K-ras G12D, PIK3CA mutations ? Cycle 1 FOLFOX 04/11/2019 ? Cycle 2 FOLFOX 05/28/2019 ? Cycle 3 FOLFOX 06/11/2019 ? Cycle 4 FOLFOX 06/25/2019 ? CT abdomen/pelvis 07/10/2019-severe obstructing stricture at the sigmoid colon, stable to slight improvement in hepatic metastatic disease compared to February 2021 ? Cycle 5 FOLFOX 07/31/2019 ? Cycle 6 FOLFOX 08/13/2019 ? Cycle 7 FOLFOX 08/27/2019 2. 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 Diabetes 4. Coronary artery disease, status post coronary artery bypass surgery in 2017 5. Neutropenia following cycle 1 FOLFOX 6. Admission 04/28/2019 with COVID-19 infection 7. C. difficile colitis 04/28/2019 8. Atrial flutter during hospital admission March/April 2021-treated with a Cardizem drip, digoxin, and Eliquis anticoagulation. Converted to metoprolol at discharge.  Admission with recurrent rapid atrial fibrillation/flutter 07/09/2019 9. 07/09/2019-hospital admission for bowel obstruction  Sigmoidoscopy 07/12/2019-completely obstructing mass at 17-20 cm proximal to the anus-oozing present, stent placed 10. Fall 07/24/2019 with acute kidney injury/rhabdomyolysis 11. Anemia secondary to chronic disease, malnutrition, phlebotomy, GI bleeding 12.  Orthostatic hypotension-on midodrine 3 times daily. 13.  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 Pollack has completed 6 cycles of FOLFOX.  Plan to proceed with cycle 7 today as scheduled.  He will undergo restaging CTs before his next appointment.  We reviewed the CBC and chemistry panel from today.  Labs are adequate to proceed with treatment.  Creatinine remains mildly elevated.  He has a right foot drop.  This is likely due to weight loss/nerve compression.  We will notify the nursing facility/physical therapy and  request  a brace.  He will undergo restaging CTs in 2 weeks.  We will see him in follow-up in 3 weeks.  He will contact the office in the interim with any problems.  Patient seen with Dr. [name].    Ned Card ANP/GNP-BC   08/27/2019  9:26 AM  This was a shared visit with Ned Card.  Mr. Netter was interviewed and examined.  He has completed 6 cycles of FOLFOX.  He will complete cycle 7 today.  He continues to have a borderline performance status to continue chemotherapy.  We will follow up on the CEA from today.  He will be referred for restaging CTs after this cycle of chemotherapy. The foot drop is most likely related to peripheral nerve compression in the setting of immobility and weight loss. Julieanne Manson, MD

## 2019-08-28 ENCOUNTER — Telehealth: Payer: Self-pay | Admitting: Nurse Practitioner

## 2019-08-28 NOTE — Telephone Encounter (Signed)
Scheduled per 7/27 los. Pt is aware of appt times and dates. Noted to give pt appt calendar on next visit.

## 2019-08-29 ENCOUNTER — Other Ambulatory Visit: Payer: Self-pay

## 2019-08-29 ENCOUNTER — Inpatient Hospital Stay: Payer: Medicare HMO

## 2019-08-29 VITALS — BP 105/60 | HR 97 | Temp 97.8°F | Resp 16

## 2019-08-29 DIAGNOSIS — E119 Type 2 diabetes mellitus without complications: Secondary | ICD-10-CM | POA: Diagnosis not present

## 2019-08-29 DIAGNOSIS — C19 Malignant neoplasm of rectosigmoid junction: Secondary | ICD-10-CM | POA: Diagnosis not present

## 2019-08-29 DIAGNOSIS — I2510 Atherosclerotic heart disease of native coronary artery without angina pectoris: Secondary | ICD-10-CM | POA: Diagnosis not present

## 2019-08-29 DIAGNOSIS — Z5111 Encounter for antineoplastic chemotherapy: Secondary | ICD-10-CM | POA: Diagnosis not present

## 2019-08-29 DIAGNOSIS — I4892 Unspecified atrial flutter: Secondary | ICD-10-CM | POA: Diagnosis not present

## 2019-08-29 DIAGNOSIS — C187 Malignant neoplasm of sigmoid colon: Secondary | ICD-10-CM

## 2019-08-29 DIAGNOSIS — Z1589 Genetic susceptibility to other disease: Secondary | ICD-10-CM | POA: Diagnosis not present

## 2019-08-29 DIAGNOSIS — D701 Agranulocytosis secondary to cancer chemotherapy: Secondary | ICD-10-CM | POA: Diagnosis not present

## 2019-08-29 DIAGNOSIS — Z85810 Personal history of malignant neoplasm of tongue: Secondary | ICD-10-CM | POA: Diagnosis not present

## 2019-08-29 DIAGNOSIS — C787 Secondary malignant neoplasm of liver and intrahepatic bile duct: Secondary | ICD-10-CM | POA: Diagnosis not present

## 2019-08-29 DIAGNOSIS — C186 Malignant neoplasm of descending colon: Secondary | ICD-10-CM

## 2019-08-29 DIAGNOSIS — Z95828 Presence of other vascular implants and grafts: Secondary | ICD-10-CM

## 2019-08-29 MED ORDER — PEGFILGRASTIM-CBQV 6 MG/0.6ML ~~LOC~~ SOSY
PREFILLED_SYRINGE | SUBCUTANEOUS | Status: AC
  Filled 2019-08-29: qty 0.6

## 2019-08-29 MED ORDER — PEGFILGRASTIM-CBQV 6 MG/0.6ML ~~LOC~~ SOSY
6.0000 mg | PREFILLED_SYRINGE | Freq: Once | SUBCUTANEOUS | Status: AC
  Administered 2019-08-29: 6 mg via SUBCUTANEOUS

## 2019-08-29 MED ORDER — SODIUM CHLORIDE 0.9% FLUSH
10.0000 mL | Freq: Once | INTRAVENOUS | Status: AC
  Administered 2019-08-29: 10 mL
  Filled 2019-08-29: qty 10

## 2019-08-29 MED ORDER — HEPARIN SOD (PORK) LOCK FLUSH 100 UNIT/ML IV SOLN
500.0000 [IU] | Freq: Once | INTRAVENOUS | Status: AC
  Administered 2019-08-29: 500 [IU]
  Filled 2019-08-29: qty 5

## 2019-08-29 NOTE — Patient Instructions (Signed)

## 2019-09-09 ENCOUNTER — Other Ambulatory Visit: Payer: Self-pay

## 2019-09-09 ENCOUNTER — Ambulatory Visit: Payer: Medicare HMO | Admitting: Cardiovascular Disease

## 2019-09-09 ENCOUNTER — Telehealth: Payer: Self-pay

## 2019-09-09 DIAGNOSIS — L89510 Pressure ulcer of right ankle, unstageable: Secondary | ICD-10-CM | POA: Diagnosis not present

## 2019-09-09 DIAGNOSIS — L89520 Pressure ulcer of left ankle, unstageable: Secondary | ICD-10-CM | POA: Diagnosis not present

## 2019-09-09 DIAGNOSIS — S80819A Abrasion, unspecified lower leg, initial encounter: Secondary | ICD-10-CM | POA: Diagnosis not present

## 2019-09-09 NOTE — Telephone Encounter (Signed)
Spoke with CT per [facility] resident does not need new labs for ct labs can be used from 08/27/19

## 2019-09-09 NOTE — Telephone Encounter (Signed)
Called to confirm appt for CT Scan and clarify no need for new labs

## 2019-09-10 ENCOUNTER — Encounter (HOSPITAL_COMMUNITY): Payer: Self-pay

## 2019-09-10 ENCOUNTER — Encounter (HOSPITAL_COMMUNITY): Payer: Self-pay | Admitting: Emergency Medicine

## 2019-09-10 ENCOUNTER — Other Ambulatory Visit: Payer: Self-pay

## 2019-09-10 ENCOUNTER — Emergency Department (HOSPITAL_COMMUNITY): Payer: Medicare HMO

## 2019-09-10 ENCOUNTER — Ambulatory Visit (HOSPITAL_COMMUNITY)
Admission: RE | Admit: 2019-09-10 | Discharge: 2019-09-10 | Disposition: A | Discharge status: Home / Self Care | Payer: Medicare HMO | Source: Ambulatory Visit | Attending: Nurse Practitioner | Admitting: Nurse Practitioner

## 2019-09-10 ENCOUNTER — Inpatient Hospital Stay: Payer: Medicare HMO

## 2019-09-10 ENCOUNTER — Inpatient Hospital Stay (HOSPITAL_COMMUNITY)
Admission: EM | Admit: 2019-09-10 | Discharge: 2019-09-21 | DRG: 372 | Disposition: A | Discharge status: Skilled Nursing Facility | Payer: Medicare HMO | Attending: Internal Medicine | Admitting: Internal Medicine

## 2019-09-10 DIAGNOSIS — Z803 Family history of malignant neoplasm of breast: Secondary | ICD-10-CM

## 2019-09-10 DIAGNOSIS — C787 Secondary malignant neoplasm of liver and intrahepatic bile duct: Secondary | ICD-10-CM | POA: Diagnosis not present

## 2019-09-10 DIAGNOSIS — E46 Unspecified protein-calorie malnutrition: Secondary | ICD-10-CM | POA: Diagnosis present

## 2019-09-10 DIAGNOSIS — T451X5A Adverse effect of antineoplastic and immunosuppressive drugs, initial encounter: Secondary | ICD-10-CM | POA: Diagnosis present

## 2019-09-10 DIAGNOSIS — I5042 Chronic combined systolic (congestive) and diastolic (congestive) heart failure: Secondary | ICD-10-CM | POA: Diagnosis not present

## 2019-09-10 DIAGNOSIS — Z955 Presence of coronary angioplasty implant and graft: Secondary | ICD-10-CM | POA: Diagnosis not present

## 2019-09-10 DIAGNOSIS — D849 Immunodeficiency, unspecified: Secondary | ICD-10-CM | POA: Diagnosis not present

## 2019-09-10 DIAGNOSIS — E119 Type 2 diabetes mellitus without complications: Secondary | ICD-10-CM

## 2019-09-10 DIAGNOSIS — I959 Hypotension, unspecified: Secondary | ICD-10-CM | POA: Diagnosis not present

## 2019-09-10 DIAGNOSIS — Z8619 Personal history of other infectious and parasitic diseases: Secondary | ICD-10-CM | POA: Diagnosis not present

## 2019-09-10 DIAGNOSIS — Z79899 Other long term (current) drug therapy: Secondary | ICD-10-CM | POA: Diagnosis not present

## 2019-09-10 DIAGNOSIS — Z20822 Contact with and (suspected) exposure to covid-19: Secondary | ICD-10-CM | POA: Diagnosis not present

## 2019-09-10 DIAGNOSIS — Z85810 Personal history of malignant neoplasm of tongue: Secondary | ICD-10-CM

## 2019-09-10 DIAGNOSIS — K8689 Other specified diseases of pancreas: Secondary | ICD-10-CM | POA: Diagnosis not present

## 2019-09-10 DIAGNOSIS — K8020 Calculus of gallbladder without cholecystitis without obstruction: Secondary | ICD-10-CM | POA: Diagnosis not present

## 2019-09-10 DIAGNOSIS — I482 Chronic atrial fibrillation, unspecified: Secondary | ICD-10-CM | POA: Diagnosis not present

## 2019-09-10 DIAGNOSIS — M21371 Foot drop, right foot: Secondary | ICD-10-CM | POA: Diagnosis present

## 2019-09-10 DIAGNOSIS — E869 Volume depletion, unspecified: Secondary | ICD-10-CM | POA: Diagnosis present

## 2019-09-10 DIAGNOSIS — Z951 Presence of aortocoronary bypass graft: Secondary | ICD-10-CM | POA: Diagnosis not present

## 2019-09-10 DIAGNOSIS — C189 Malignant neoplasm of colon, unspecified: Secondary | ICD-10-CM | POA: Diagnosis not present

## 2019-09-10 DIAGNOSIS — J449 Chronic obstructive pulmonary disease, unspecified: Secondary | ICD-10-CM | POA: Diagnosis present

## 2019-09-10 DIAGNOSIS — L89151 Pressure ulcer of sacral region, stage 1: Secondary | ICD-10-CM | POA: Diagnosis present

## 2019-09-10 DIAGNOSIS — E1141 Type 2 diabetes mellitus with diabetic mononeuropathy: Secondary | ICD-10-CM | POA: Diagnosis present

## 2019-09-10 DIAGNOSIS — D010 Carcinoma in situ of colon: Secondary | ICD-10-CM | POA: Diagnosis not present

## 2019-09-10 DIAGNOSIS — L89512 Pressure ulcer of right ankle, stage 2: Secondary | ICD-10-CM | POA: Diagnosis present

## 2019-09-10 DIAGNOSIS — R5381 Other malaise: Secondary | ICD-10-CM | POA: Diagnosis not present

## 2019-09-10 DIAGNOSIS — I951 Orthostatic hypotension: Secondary | ICD-10-CM | POA: Diagnosis present

## 2019-09-10 DIAGNOSIS — L03116 Cellulitis of left lower limb: Secondary | ICD-10-CM | POA: Diagnosis not present

## 2019-09-10 DIAGNOSIS — C186 Malignant neoplasm of descending colon: Secondary | ICD-10-CM | POA: Insufficient documentation

## 2019-09-10 DIAGNOSIS — D72829 Elevated white blood cell count, unspecified: Secondary | ICD-10-CM | POA: Diagnosis not present

## 2019-09-10 DIAGNOSIS — C19 Malignant neoplasm of rectosigmoid junction: Secondary | ICD-10-CM | POA: Diagnosis present

## 2019-09-10 DIAGNOSIS — A0471 Enterocolitis due to Clostridium difficile, recurrent: Principal | ICD-10-CM | POA: Diagnosis present

## 2019-09-10 DIAGNOSIS — K529 Noninfective gastroenteritis and colitis, unspecified: Secondary | ICD-10-CM | POA: Diagnosis present

## 2019-09-10 DIAGNOSIS — Z8719 Personal history of other diseases of the digestive system: Secondary | ICD-10-CM

## 2019-09-10 DIAGNOSIS — I479 Paroxysmal tachycardia, unspecified: Secondary | ICD-10-CM | POA: Diagnosis not present

## 2019-09-10 DIAGNOSIS — Z7401 Bed confinement status: Secondary | ICD-10-CM | POA: Diagnosis not present

## 2019-09-10 DIAGNOSIS — B3749 Other urogenital candidiasis: Secondary | ICD-10-CM | POA: Diagnosis present

## 2019-09-10 DIAGNOSIS — I5022 Chronic systolic (congestive) heart failure: Secondary | ICD-10-CM | POA: Diagnosis not present

## 2019-09-10 DIAGNOSIS — I4892 Unspecified atrial flutter: Secondary | ICD-10-CM | POA: Diagnosis present

## 2019-09-10 DIAGNOSIS — Z85038 Personal history of other malignant neoplasm of large intestine: Secondary | ICD-10-CM | POA: Diagnosis not present

## 2019-09-10 DIAGNOSIS — Z8371 Family history of colonic polyps: Secondary | ICD-10-CM

## 2019-09-10 DIAGNOSIS — M2550 Pain in unspecified joint: Secondary | ICD-10-CM | POA: Diagnosis not present

## 2019-09-10 DIAGNOSIS — I700 Atherosclerosis of aorta: Secondary | ICD-10-CM | POA: Diagnosis not present

## 2019-09-10 DIAGNOSIS — Z8589 Personal history of malignant neoplasm of other organs and systems: Secondary | ICD-10-CM

## 2019-09-10 DIAGNOSIS — I2510 Atherosclerotic heart disease of native coronary artery without angina pectoris: Secondary | ICD-10-CM | POA: Diagnosis present

## 2019-09-10 DIAGNOSIS — I483 Typical atrial flutter: Secondary | ICD-10-CM | POA: Diagnosis not present

## 2019-09-10 DIAGNOSIS — R531 Weakness: Secondary | ICD-10-CM | POA: Diagnosis not present

## 2019-09-10 DIAGNOSIS — C187 Malignant neoplasm of sigmoid colon: Secondary | ICD-10-CM | POA: Diagnosis not present

## 2019-09-10 DIAGNOSIS — B3741 Candidal cystitis and urethritis: Secondary | ICD-10-CM | POA: Diagnosis present

## 2019-09-10 DIAGNOSIS — R197 Diarrhea, unspecified: Secondary | ICD-10-CM | POA: Diagnosis not present

## 2019-09-10 DIAGNOSIS — D709 Neutropenia, unspecified: Secondary | ICD-10-CM | POA: Diagnosis present

## 2019-09-10 DIAGNOSIS — Z8249 Family history of ischemic heart disease and other diseases of the circulatory system: Secondary | ICD-10-CM

## 2019-09-10 DIAGNOSIS — Z8616 Personal history of COVID-19: Secondary | ICD-10-CM

## 2019-09-10 DIAGNOSIS — D638 Anemia in other chronic diseases classified elsewhere: Secondary | ICD-10-CM | POA: Diagnosis present

## 2019-09-10 LAB — URINALYSIS, ROUTINE W REFLEX MICROSCOPIC
Bilirubin Urine: NEGATIVE
Glucose, UA: NEGATIVE mg/dL
Ketones, ur: 5 mg/dL — AB
Nitrite: NEGATIVE
Protein, ur: 100 mg/dL — AB
Specific Gravity, Urine: 1.017 (ref 1.005–1.030)
WBC, UA: >50 WBC/hpf — ABNORMAL HIGH (ref 0–5)
pH: 5 (ref 5.0–8.0)

## 2019-09-10 LAB — CBC WITH DIFFERENTIAL/PLATELET
Abs Immature Granulocytes: 0.9 10*3/uL — ABNORMAL HIGH (ref 0.00–0.07)
Basophils Absolute: 0.1 10*3/uL (ref 0.0–0.1)
Basophils Relative: 1 %
Eosinophils Absolute: 0.1 10*3/uL (ref 0.0–0.5)
Eosinophils Relative: 0 %
HCT: 33.1 % — ABNORMAL LOW (ref 39.0–52.0)
Hemoglobin: 10.4 g/dL — ABNORMAL LOW (ref 13.0–17.0)
Immature Granulocytes: 4 %
Lymphocytes Relative: 5 %
Lymphs Abs: 1 10*3/uL (ref 0.7–4.0)
MCH: 29 pg (ref 26.0–34.0)
MCHC: 31.4 g/dL (ref 30.0–36.0)
MCV: 92.2 fL (ref 80.0–100.0)
Monocytes Absolute: 1.6 10*3/uL — ABNORMAL HIGH (ref 0.1–1.0)
Monocytes Relative: 7 %
Neutro Abs: 18.6 10*3/uL — ABNORMAL HIGH (ref 1.7–7.7)
Neutrophils Relative %: 83 %
Platelets: 138 10*3/uL — ABNORMAL LOW (ref 150–400)
RBC: 3.59 MIL/uL — ABNORMAL LOW (ref 4.22–5.81)
RDW: 16.3 % — ABNORMAL HIGH (ref 11.5–15.5)
WBC: 22.4 10*3/uL — ABNORMAL HIGH (ref 4.0–10.5)
nRBC: 0 % (ref 0.0–0.2)

## 2019-09-10 LAB — BASIC METABOLIC PANEL
Anion gap: 16 — ABNORMAL HIGH (ref 5–15)
BUN: 21 mg/dL (ref 8–23)
CO2: 24 mmol/L (ref 22–32)
Calcium: 8.9 mg/dL (ref 8.9–10.3)
Chloride: 100 mmol/L (ref 98–111)
Creatinine, Ser: 1.48 mg/dL — ABNORMAL HIGH (ref 0.61–1.24)
GFR calc Af Amer: 54 mL/min — ABNORMAL LOW (ref >60)
GFR calc non Af Amer: 47 mL/min — ABNORMAL LOW (ref >60)
Glucose, Bld: 137 mg/dL — ABNORMAL HIGH (ref 70–99)
Potassium: 3.7 mmol/L (ref 3.5–5.1)
Sodium: 140 mmol/L (ref 135–145)

## 2019-09-10 LAB — SARS CORONAVIRUS 2 BY RT PCR (HOSPITAL ORDER, PERFORMED IN ~~LOC~~ HOSPITAL LAB): SARS Coronavirus 2: NEGATIVE

## 2019-09-10 LAB — GLUCOSE, CAPILLARY: Glucose-Capillary: 126 mg/dL — ABNORMAL HIGH (ref 70–99)

## 2019-09-10 LAB — PROCALCITONIN: Procalcitonin: 0.3 ng/mL

## 2019-09-10 LAB — C DIFFICILE QUICK SCREEN W PCR REFLEX
C Diff antigen: POSITIVE — AB
C Diff toxin: NEGATIVE

## 2019-09-10 LAB — LACTIC ACID, PLASMA: Lactic Acid, Venous: 1.6 mmol/L (ref 0.5–1.9)

## 2019-09-10 MED ORDER — ACETAMINOPHEN 325 MG PO TABS: 650.0000 mg | ORAL_TABLET | Freq: Four times a day (QID) | ORAL | Status: DC | PRN

## 2019-09-10 MED ORDER — VANCOMYCIN 50 MG/ML ORAL SOLUTION
125.0000 mg | Freq: Four times a day (QID) | ORAL | Status: DC
  Administered 2019-09-10 – 2019-09-16 (×25): 125 mg via ORAL
  Filled 2019-09-10 (×29): qty 2.5

## 2019-09-10 MED ORDER — SODIUM CHLORIDE (PF) 0.9 % IJ SOLN
INTRAMUSCULAR | Status: AC
  Filled 2019-09-10: qty 50

## 2019-09-10 MED ORDER — SODIUM CHLORIDE 0.9 % IV BOLUS
1000.0000 mL | Freq: Once | INTRAVENOUS | Status: AC
  Administered 2019-09-10: 1000 mL via INTRAVENOUS

## 2019-09-10 MED ORDER — ONDANSETRON HCL 4 MG/2ML IJ SOLN: 4.0000 mg | Freq: Four times a day (QID) | INTRAMUSCULAR | Status: DC | PRN

## 2019-09-10 MED ORDER — ACETAMINOPHEN 650 MG RE SUPP: 650.0000 mg | Freq: Four times a day (QID) | RECTAL | Status: DC | PRN

## 2019-09-10 MED ORDER — ENOXAPARIN SODIUM 40 MG/0.4ML ~~LOC~~ SOLN
40.0000 mg | SUBCUTANEOUS | Status: DC
  Administered 2019-09-10 – 2019-09-20 (×11): 40 mg via SUBCUTANEOUS
  Filled 2019-09-10 (×11): qty 0.4

## 2019-09-10 MED ORDER — DIGOXIN 125 MCG PO TABS
0.2500 mg | ORAL_TABLET | Freq: Every day | ORAL | Status: DC
  Administered 2019-09-11 – 2019-09-21 (×11): 0.25 mg via ORAL
  Filled 2019-09-10 (×11): qty 2

## 2019-09-10 MED ORDER — INSULIN ASPART 100 UNIT/ML ~~LOC~~ SOLN
0.0000 [IU] | Freq: Three times a day (TID) | SUBCUTANEOUS | Status: DC
  Administered 2019-09-12 – 2019-09-13 (×3): 2 [IU] via SUBCUTANEOUS
  Administered 2019-09-13 – 2019-09-14 (×2): 3 [IU] via SUBCUTANEOUS
  Administered 2019-09-14: 2 [IU] via SUBCUTANEOUS
  Administered 2019-09-14: 3 [IU] via SUBCUTANEOUS
  Administered 2019-09-15: 2 [IU] via SUBCUTANEOUS
  Administered 2019-09-15 (×2): 5 [IU] via SUBCUTANEOUS
  Administered 2019-09-16 (×2): 8 [IU] via SUBCUTANEOUS
  Administered 2019-09-17: 5 [IU] via SUBCUTANEOUS
  Administered 2019-09-17: 2 [IU] via SUBCUTANEOUS
  Administered 2019-09-17 – 2019-09-18 (×3): 3 [IU] via SUBCUTANEOUS
  Administered 2019-09-19: 2 [IU] via SUBCUTANEOUS
  Administered 2019-09-19 – 2019-09-20 (×3): 3 [IU] via SUBCUTANEOUS
  Administered 2019-09-21: 2 [IU] via SUBCUTANEOUS
  Filled 2019-09-10: qty 0.15

## 2019-09-10 MED ORDER — ONDANSETRON HCL 4 MG PO TABS: 4.0000 mg | ORAL_TABLET | Freq: Four times a day (QID) | ORAL | Status: DC | PRN

## 2019-09-10 MED ORDER — SODIUM CHLORIDE 0.9 % IV SOLN: INTRAVENOUS | Status: AC

## 2019-09-10 NOTE — ED Notes (Signed)
Right chest port accessed. Blood returned. Flushed.

## 2019-09-10 NOTE — ED Triage Notes (Signed)
Arrives from CT via stretcher, was going for a routine CT to see colon CA progression, from [facility]. C/C hypotension, was initially 60s/40s and then when placed in stretcher and trendelenburg position it was 90s/70s. Patient alert and oriented.
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
## 2019-09-10 NOTE — H&P (Signed)
[facility] History and Physical  Clarence Andrews. WFU:[id number] DOB: Dec 16, 1950 DOA: 09/10/2019   PCP: Patient, No Pcp Per  Specialists: Followed by Dr. [name] with medical oncology  Chief Complaint: Hypotension  HPI: Clarence Andrews. is a 69 y.o. male with a past medical history of colon cancer status post 7 cycles of chemotherapy with FOLFOX, stent placement to his sigmoid colon for stricture from the cancer, history of C. difficile colitis in March, history of coronary artery disease, history of diabetes mellitus type 2 who presented from a skilled nursing facility to undergo routine CT scan for his cancer staging.  While he was in the radiology department he felt weak and lightheaded.  His blood pressure was noted to be in the 60s.  He was promptly brought to the emergency department.  He was noted to have low blood pressure in the ED as well.  He was given IV fluids with improvement.  Blood work shows leukocytosis.  Upon questioning the patient he mentioned that for the past 1 to 2 weeks he has been having more diarrhea than usual.  He was diagnosed with C. difficile colitis back in March and was treated with oral vancomycin.  He denies any abdominal pain.  Denies any nausea vomiting.  No fever or chills.  He says he has had problems with his blood pressure and he is noted to be on midodrine.  He denies chest pain shortness of breath.  He has had about 5-6 bowel movements daily for the past few days.  Home Medications: Prior to Admission medications   Medication Sig Start Date End Date Taking? Authorizing Provider  ascorbic acid (VITAMIN C) 500 MG tablet Take 1 tablet (500 mg total) by mouth daily. 05/05/19  Yes Eugenie Filler, MD  Cholecalciferol (D3-1000 PO) Take 1,000 Units by mouth daily.    Yes [provider]  Chromium-Cinnamon [phone] MCG-MG CAPS Take 2,000 mg by mouth daily.   Yes [provider]  digoxin (LANOXIN) 0.25 MG tablet Take 1 tablet (0.25  mg total) by mouth daily. 07/25/19  Yes Samuella Cota, MD  ferrous sulfate 325 (65 FE) MG EC tablet Take 325 mg by mouth daily with breakfast.   Yes [provider]  glipiZIDE (GLUCOTROL) 10 MG tablet Take 10 mg by mouth daily before breakfast.   Yes [provider]  levalbuterol (XOPENEX HFA) 45 MCG/ACT inhaler Inhale 2 puffs into the lungs every 6 (six) hours as needed for wheezing. 05/04/19  Yes Eugenie Filler, MD  loperamide (IMODIUM) 2 MG capsule Take 1 capsule by mouth 4 (four) times daily as needed for diarrhea or loose stools.    Yes [provider]  magnesium oxide (MAG-OX) 400 MG tablet Take 800 mg by mouth daily.    Yes [provider]  metoprolol tartrate (LOPRESSOR) 100 MG tablet Take 1 tablet (100 mg total) by mouth 2 (two) times daily. 05/04/19  Yes Eugenie Filler, MD  midodrine (PROAMATINE) 5 MG tablet Take 1 tablet (5 mg total) by mouth 3 (three) times daily with meals. 07/24/19  Yes Samuella Cota, MD  polyethylene glycol (MIRALAX / GLYCOLAX) 17 g packet Take 17 g by mouth daily as needed for moderate constipation or severe constipation. 07/29/19  Yes Amin, Ankit Chirag, MD  Turmeric 500 MG CAPS Take 2,000 mg by mouth daily. Taking 2 - 1000 mg tabs daily   Yes [provider]  vitamin B-12 (CYANOCOBALAMIN) 500 MCG tablet Take 500 mcg by mouth  daily.   Yes [provider]  lidocaine-prilocaine (EMLA) cream Apply to port site 1-2 hours prior to use Patient not taking: Reported on 06/13/2019 04/02/19   Owens Shark, NP  senna (SENOKOT) 8.6 MG TABS tablet Take 1 tablet (8.6 mg total) by mouth 2 (two) times daily. Patient not taking: Reported on 09/10/2019 07/29/19   Damita Lack, MD  senna-docusate (SENOKOT-S) 8.6-50 MG tablet Take 1 tablet by mouth at bedtime as needed for mild constipation. Patient not taking: Reported on 09/10/2019 07/29/19   Damita Lack, MD    Allergies: No Known Allergies  Past Medical  History: Past Medical History:  Diagnosis Date  . CAD (coronary artery disease) 2017   CABG  . Cancer ([place])   . CHF (congestive heart failure) ([place])   . Colitis   . COVID-19 04/29/2019  . Diabetes ([place])   . Paronychia of second toe of right foot   . Throat cancer [facility])     Past Surgical History:  Procedure Laterality Date  . APPENDECTOMY  1970  . COLONIC STENT PLACEMENT N/A 07/12/2019   Procedure: COLONIC STENT PLACEMENT;  Surgeon: Irving Copas., MD;  Location: [facility];  Service: Gastroenterology;  Laterality: N/A;  . CORONARY ARTERY BYPASS GRAFT     5 vessel   . FLEXIBLE SIGMOIDOSCOPY N/A 07/12/2019   Procedure: FLEXIBLE SIGMOIDOSCOPY;  Surgeon: Rush Landmark Telford Nab., MD;  Location: [facility];  Service: Gastroenterology;  Laterality: N/A;  . IR IMAGING GUIDED PORT INSERTION  04/04/2019  . TOOTH EXTRACTION      Social History: Currently in a skilled nursing facility.  No smoking alcohol use.   Family History:  Family History  Problem Relation Age of Onset  . Breast cancer Mother   . Colon polyps Father   . Heart disease Father   . Colon cancer Neg Hx   . Esophageal cancer Neg Hx   . Rectal cancer Neg Hx   . Stomach cancer Neg Hx      Review of Systems - History obtained from the patient General ROS: positive for  - fatigue Psychological ROS: negative Ophthalmic ROS: negative ENT ROS: negative Allergy and Immunology ROS: negative Hematological and Lymphatic ROS: negative Endocrine ROS: negative Respiratory ROS: no cough, shortness of breath, or wheezing Cardiovascular ROS: no chest pain or dyspnea on exertion Gastrointestinal ROS: As in HPI Genito-Urinary ROS: no dysuria, trouble voiding, or hematuria Musculoskeletal ROS: negative Neurological ROS: no TIA or stroke symptoms Dermatological ROS: negative  Physical Examination  Vitals:   09/10/19 0958 09/10/19 1030 09/10/19 1031 09/10/19 1327  BP: 117/72 118/72  132/88  Pulse: 73 69 69 83    Resp: (!) 9 14 15 12   Temp:      TempSrc:      SpO2: 96% 100% 100% 93%  Weight:      Height:        BP 132/88 (BP Location: Right Arm)   Pulse 83   Temp 98.7 F (37.1 C) (Oral)   Resp 12   Ht 6\' 2"  (1.88 m)   Wt 87 kg   SpO2 93%   BMI 24.63 kg/m   General appearance: alert, cooperative, appears stated age and no distress Head: Normocephalic, without obvious abnormality, atraumatic Eyes: conjunctivae/corneas clear. PERRL, EOM's intact.  Throat: Dry mucous membranes Neck: no adenopathy, no carotid bruit, no JVD, supple, symmetrical, trachea midline and thyroid not enlarged, symmetric, no tenderness/mass/nodules Resp: clear to auscultation bilaterally Cardio: regular rate and rhythm, S1, S2 normal, no  murmur, click, rub or gallop GI: Abdomen soft.  Mildly tender in the lower abdomen without any rebound rigidity or guarding.  No masses organomegaly. Extremities: extremities normal, atraumatic, no cyanosis or edema Pulses: 2+ and symmetric Skin: Skin color, texture, turgor normal. No rashes or lesions Lymph nodes: Cervical, supraclavicular, and axillary nodes normal. Neurologic: No focal neurological deficits.   Labs on Admission: I have personally reviewed following labs and imaging studies  CBC: Recent Labs  Lab 09/10/19 0922  WBC 22.4*  NEUTROABS 18.6*  HGB 10.4*  HCT 33.1*  MCV 92.2  PLT 315*   Basic Metabolic Panel: Recent Labs  Lab 09/10/19 0922  NA 140  K 3.7  CL 100  CO2 24  GLUCOSE 137*  BUN 21  CREATININE 1.48*  CALCIUM 8.9   GFR: Estimated Creatinine Clearance: 53.2 mL/min (A) (by C-G formula based on SCr of 1.48 mg/dL (H)).  CBG: Recent Labs  Lab 09/10/19 0819  GLUCAP 126*     Radiological Exams on Admission: DG Chest Port 1 View  Result Date: 09/10/2019 CLINICAL DATA:  Weakness with leukocytosis. EXAM: PORTABLE CHEST 1 VIEW COMPARISON:  Chest x-ray dated July 09, 2019. FINDINGS: Unchanged right chest wall port catheter. The heart size  and mediastinal contours are within normal limits. Prior CABG and left atrial appendage clipping. Normal pulmonary vascularity. No focal consolidation, pleural effusion, or pneumothorax. No acute osseous abnormality. IMPRESSION: No active disease. Electronically Signed   By: Titus Dubin M.D.   On: 09/10/2019 12:17    My interpretation of Electrocardiogram: Sinus rhythm in the 70s.  Prolonged PR interval noted.  Left axis.  PVC.  No concerning ST or T wave changes.   Problem List  Principal Problem:   Acute diarrhea Active Problems:   Cancer of left colon (HCC)   Hypotension   Assessment: This is a 69 year old Caucasian male who lives in a skilled nursing facility who has a history of colon cancer who was noted to be hypotensive when he was in the radiology department.  He reports more diarrhea than usual over the last many days.  He has a leukocytosis.  He has a previous history of C. difficile.  Concern is for recurrence of his C. difficile diarrhea.    Plan:  1. Acute on chronic diarrhea in the setting of a history of C. difficile: Stool studies have been ordered.  We will empirically start him on oral vancomycin.  His abdomen however is benign.  GI pathogen panel has also been ordered.  2.  Hypotension: Patient has a history of orthostatic hypotension.  He is noted to be on midodrine.  Patient's blood pressure was low in the radiology department with systolic in the 40G.  He was given IV fluids with good response.  Hypotension could have been due to volume loss from diarrhea.  Monitor blood pressures closely.   3.  Metastatic colon cancer: Under the care of Dr. [name].  He is receiving chemotherapy.  His last infusion was on 7/29.  It looks like he also got PEG filgrastim on that day.  This could be the reason for his leukocytosis.  He has extent in his sigmoid colon for his stricture.  4.  Diabetes mellitus type 2: Monitor CBGs.  Not on insulin at home.  HbA1c was 7.8 in  June.  5. History of atrial flutter/atrial fibrillation: Not thought to be a good candidate for anticoagulation per previous notes.  He is on digoxin which is being continued.  Check digoxin level  tomorrow.  Hold Lopressor due to hypotension.  Monitor on telemetry.  6.  History of COPD: Stable  7.  Anemia of chronic disease: Monitor hemoglobin.  No evidence of overt bleeding.  8.  Chronic combined systolic and diastolic CHF: Most recent echocardiogram in June with preserved ejection fraction.  Currently euvolemic.  9. History of coronary artery disease status post CABG: Stable  10. Wounds bilateral feet: States that he is followed by podiatry.  They were examined yesterday and were noted to be improving.   DVT Prophylaxis: Lovenox Code Status: Full code Family Communication: Discussed with the patient Disposition: Hopefully return to SNF when improved Consults called: Medical oncology will be notified via epic Admission Status: Status is: Inpatient  Remains inpatient appropriate because:Inpatient level of care appropriate due to severity of illness   Dispo: The patient is from: SNF              Anticipated d/c is to: SNF              Anticipated d/c date is: 2 days              Patient currently is not medically stable to d/c.    Severity of Illness: The appropriate patient status for this patient is INPATIENT. Inpatient status is judged to be reasonable and necessary in order to provide the required intensity of service to ensure the patient's safety. The patient's presenting symptoms, physical exam findings, and initial radiographic and laboratory data in the context of their chronic comorbidities is felt to place them at high risk for further clinical deterioration. Furthermore, it is not anticipated that the patient will be medically stable for discharge from the hospital within 2 midnights of admission. The following factors support the patient status of inpatient.   " The  patient's presenting symptoms include hypotension. " The worrisome physical exam findings include dehydration. " The initial radiographic and laboratory data are worrisome because of leukocytosis. " The chronic co-morbidities include diabetes and colon cancer.   * I certify that at the point of admission it is my clinical judgment that the patient will require inpatient hospital care spanning beyond 2 midnights from the point of admission due to high intensity of service, high risk for further deterioration and high frequency of surveillance required.*    Further management decisions will depend on results of further testing and patient's response to treatment.   Imogine Carvell [employer]  [facility] operational officer on [email or web address]  09/10/2019, 5:39 PM

## 2019-09-11 ENCOUNTER — Encounter (HOSPITAL_COMMUNITY): Payer: Self-pay | Admitting: Internal Medicine

## 2019-09-11 ENCOUNTER — Inpatient Hospital Stay (HOSPITAL_COMMUNITY): Payer: Medicare HMO

## 2019-09-11 DIAGNOSIS — R197 Diarrhea, unspecified: Secondary | ICD-10-CM

## 2019-09-11 LAB — COMPREHENSIVE METABOLIC PANEL
ALT: 13 U/L (ref 0–44)
AST: 22 U/L (ref 15–41)
Albumin: 2.8 g/dL — ABNORMAL LOW (ref 3.5–5.0)
Alkaline Phosphatase: 177 U/L — ABNORMAL HIGH (ref 38–126)
Anion gap: 10 (ref 5–15)
BUN: 22 mg/dL (ref 8–23)
CO2: 27 mmol/L (ref 22–32)
Calcium: 8.5 mg/dL — ABNORMAL LOW (ref 8.9–10.3)
Chloride: 104 mmol/L (ref 98–111)
Creatinine, Ser: 1.19 mg/dL (ref 0.61–1.24)
GFR calc Af Amer: >60 mL/min (ref >60)
GFR calc non Af Amer: >60 mL/min (ref >60)
Glucose, Bld: 83 mg/dL (ref 70–99)
Potassium: 3.4 mmol/L — ABNORMAL LOW (ref 3.5–5.1)
Sodium: 141 mmol/L (ref 135–145)
Total Bilirubin: 0.7 mg/dL (ref 0.3–1.2)
Total Protein: 6 g/dL — ABNORMAL LOW (ref 6.5–8.1)

## 2019-09-11 LAB — GASTROINTESTINAL PANEL BY PCR, STOOL (REPLACES STOOL CULTURE)

## 2019-09-11 LAB — CBC
HCT: 30.6 % — ABNORMAL LOW (ref 39.0–52.0)
Hemoglobin: 9.5 g/dL — ABNORMAL LOW (ref 13.0–17.0)
MCH: 28.8 pg (ref 26.0–34.0)
MCHC: 31 g/dL (ref 30.0–36.0)
MCV: 92.7 fL (ref 80.0–100.0)
Platelets: 136 10*3/uL — ABNORMAL LOW (ref 150–400)
RBC: 3.3 MIL/uL — ABNORMAL LOW (ref 4.22–5.81)
RDW: 16.2 % — ABNORMAL HIGH (ref 11.5–15.5)
WBC: 21.7 10*3/uL — ABNORMAL HIGH (ref 4.0–10.5)
nRBC: 0 % (ref 0.0–0.2)

## 2019-09-11 LAB — URINE CULTURE: Culture: 30000 — AB

## 2019-09-11 LAB — GLUCOSE, CAPILLARY
Glucose-Capillary: 76 mg/dL (ref 70–99)
Glucose-Capillary: 91 mg/dL (ref 70–99)
Glucose-Capillary: 119 mg/dL — ABNORMAL HIGH (ref 70–99)
Glucose-Capillary: 94 mg/dL (ref 70–99)
Glucose-Capillary: 112 mg/dL — ABNORMAL HIGH (ref 70–99)

## 2019-09-11 LAB — DIGOXIN LEVEL: Digoxin Level: 0.6 ng/mL — ABNORMAL LOW (ref 0.8–2.0)

## 2019-09-11 LAB — PROCALCITONIN: Procalcitonin: 0.31 ng/mL

## 2019-09-11 LAB — MRSA PCR SCREENING: MRSA by PCR: NEGATIVE

## 2019-09-11 LAB — CLOSTRIDIUM DIFFICILE BY PCR, REFLEXED: Toxigenic C. Difficile by PCR: POSITIVE — AB

## 2019-09-11 MED ORDER — IOHEXOL 300 MG/ML  SOLN
100.0000 mL | Freq: Once | INTRAMUSCULAR | Status: AC | PRN
  Administered 2019-09-11: 100 mL via INTRAVENOUS

## 2019-09-11 MED ORDER — SODIUM CHLORIDE (PF) 0.9 % IJ SOLN
INTRAMUSCULAR | Status: AC
  Filled 2019-09-11: qty 50

## 2019-09-11 MED ORDER — ADULT MULTIVITAMIN W/MINERALS CH
1.0000 | ORAL_TABLET | Freq: Every day | ORAL | Status: DC
  Administered 2019-09-11 – 2019-09-21 (×11): 1 via ORAL
  Filled 2019-09-11 (×11): qty 1

## 2019-09-11 MED ORDER — SODIUM CHLORIDE 0.9% FLUSH
10.0000 mL | Freq: Two times a day (BID) | INTRAVENOUS | Status: DC
  Administered 2019-09-13 – 2019-09-19 (×8): 10 mL

## 2019-09-11 MED ORDER — CHLORHEXIDINE GLUCONATE CLOTH 2 % EX PADS
6.0000 | MEDICATED_PAD | Freq: Every day | CUTANEOUS | Status: DC
  Administered 2019-09-11 – 2019-09-20 (×10): 6 via TOPICAL

## 2019-09-11 MED ORDER — IOHEXOL 9 MG/ML PO SOLN
ORAL | Status: AC
  Filled 2019-09-11: qty 1000

## 2019-09-11 MED ORDER — SODIUM CHLORIDE 0.9% FLUSH
10.0000 mL | INTRAVENOUS | Status: DC | PRN
  Administered 2019-09-21: 10 mL

## 2019-09-11 MED ORDER — MUPIROCIN CALCIUM 2 % EX CREA
TOPICAL_CREAM | Freq: Every day | CUTANEOUS | Status: DC
  Administered 2019-09-11 – 2019-09-12 (×2): 2 via TOPICAL
  Filled 2019-09-11: qty 15

## 2019-09-11 MED ORDER — KATE FARMS STANDARD 1.4 PO LIQD
325.0000 mL | Freq: Two times a day (BID) | ORAL | Status: DC
  Administered 2019-09-11 – 2019-09-15 (×3): 325 mL via ORAL
  Filled 2019-09-11 (×15): qty 325

## 2019-09-11 NOTE — Evaluation (Addendum)
Physical Therapy Evaluation Patient Details Name: Clarence Andrews. MRN: [id number] DOB: 29-Apr-1950 Today's Date: 09/11/2019   History of Present Illness  69 y.o. 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 difficile colitis in March, CAD, diabetes mellitus type 2 who presented from a skilled nursing facility to undergo routine CT scan for his cancer staging and was found to be hypotensive with c/o weakness and lightheaded.  Clinical Impression  Pt admitted with above diagnosis. PTA, pt in SNF participating in therapy. Pt with slow, labored movement to come to sitting EOB, requiring min A to fully upright trunk. Pt able to stand with min A to fully rise from seated surface, hip extension last. Pt able to perform standing marching with BUE assisted on RW but after ~3 minutes pt became dizzy with BP 72/89mmHg so returned to supine. BP in supine 132/77 with dizziness resolved. RN notified of BP with mobility. Pt currently with functional limitations due to the deficits listed below (see PT Problem List). Pt will benefit from skilled PT to increase their independence and safety with mobility to allow discharge to the venue listed below.       Follow Up Recommendations SNF    Equipment Recommendations  None recommended by PT    Recommendations for Other Services       Precautions / Restrictions Precautions Precautions: Fall Precaution Comments: monitor BP Restrictions Weight Bearing Restrictions: No      Mobility  Bed Mobility Overal bed mobility: Needs Assistance Bed Mobility: Supine to Sit;Sit to Supine  Supine to sit: Min assist Sit to supine: Min guard   General bed mobility comments: cues to push through bottom elbow to assist in uprighting trunk but ultimately requiring min A to fully upright trunk, min G for return to supine with cues for safety to place legs fully on  mattress  Transfers Overall transfer level: Needs assistance Equipment used: Rolling walker (2 wheeled) Transfers: Sit to/from Stand Sit to Stand: Min assist  General transfer comment: cues to push from seated surface and return hands for controlled descent to sitting, min A to power up with hip extension occurring last  Ambulation/Gait  General Gait Details: standing marching with BUE support on RW; unable to attempt gait due to dizziness onset after standing 3 minutes  Stairs            Wheelchair Mobility    Modified Rankin (Stroke Patients Only)       Balance Overall balance assessment: Needs assistance Sitting-balance support: Feet supported;No upper extremity supported Sitting balance-Leahy Scale: Fair Sitting balance - Comments: seated EOB   Standing balance support: During functional activity;Bilateral upper extremity supported Standing balance-Leahy Scale: Poor Standing balance comment: reliant on RW for steadying        Pertinent Vitals/Pain Pain Assessment: No/denies pain    Home Living                        Prior Function                 Hand Dominance        Extremity/Trunk Assessment   Upper Extremity Assessment Upper Extremity Assessment: Defer to OT evaluation    Lower Extremity Assessment Lower Extremity Assessment: Overall WFL for tasks assessed;RLE deficits/detail RLE Deficits / Details: 0/5 dorsiflexion due to chemotherapy; 3+/5 plantarflexion, knee extension, knee flexion, hip abd, hip flexion and hip  adduction    Cervical / Trunk Assessment Cervical / Trunk Assessment: Normal  Communication      Cognition Arousal/Alertness: Awake/alert Behavior During Therapy: WFL for tasks assessed/performed;Flat affect Overall Cognitive Status: Within Functional Limits for tasks assessed           General Comments General comments (skin integrity, edema, etc.): Pt standing and performing marching in place with dizziness  onset after ~3 minutes standing, BP 72/58 mmHg so returned to supine. Rechecked BP once supine and 132/44mmHg with dizziness resolved.    Exercises     Assessment/Plan    PT Assessment Patient needs continued PT services  PT Problem List Decreased strength;Decreased range of motion;Decreased activity tolerance;Decreased balance       PT Treatment Interventions DME instruction;Gait training;Functional mobility training;Therapeutic activities;Therapeutic exercise;Balance training;Patient/family education    PT Goals (Current goals can be found in the Care Plan section)  Acute Rehab PT Goals Patient Stated Goal: "get CT scan" PT Goal Formulation: With patient Time For Goal Achievement: 09/25/19 Potential to Achieve Goals: Good    Frequency Min 2X/week   Barriers to discharge        Co-evaluation PT/OT/SLP Co-Evaluation/Treatment: Yes Reason for Co-Treatment: For patient/therapist safety;To address functional/ADL transfers PT goals addressed during session: Mobility/safety with mobility;Balance;Proper use of DME         AM-PAC PT "6 Clicks" Mobility  Outcome Measure Help needed turning from your back to your side while in a flat bed without using bedrails?: A Little Help needed moving from lying on your back to sitting on the side of a flat bed without using bedrails?: A Little Help needed moving to and from a bed to a chair (including a wheelchair)?: A Little Help needed standing up from a chair using your arms (e.g., wheelchair or bedside chair)?: A Little Help needed to walk in hospital room?: A Little Help needed climbing 3-5 steps with a railing? : A Lot 6 Click Score: 17    End of Session Equipment Utilized During Treatment: Gait belt Activity Tolerance: Other (comment) (Limited by BP, dizziness) Patient left: in bed;with call bell/phone within reach;with bed alarm set Nurse Communication: Mobility status;Other (comment) (BP with mobility) PT Visit Diagnosis:  Unsteadiness on feet (R26.81);Other abnormalities of gait and mobility (R26.89)    Time: 0315-9458 PT Time Calculation (min) (ACUTE ONLY): 25 min   Charges:   PT Evaluation $PT Eval Moderate Complexity: 1 Mod           Tori Cire Deyarmin PT, DPT 09/11/19, 4:27 PM

## 2019-09-11 NOTE — Evaluation (Signed)
Occupational Therapy Evaluation Patient Details Name: Clarence Andrews. MRN: [id number] DOB: 03-21-1950 Today's Date: 09/11/2019    History of Present Illness 69 y.o. 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 difficile colitis in March, CAD, diabetes mellitus type 2 who presented from a skilled nursing facility to undergo routine CT scan for his cancer staging and was found to be hypotensive with c/o weakness and lightheaded.   Clinical Impression   [name] is a 69 year old man who presents with generalized weakness, decreased activity tolerance, impaired balance, history of right foot drop, bilateral foot wounds and orthostatic hypotension resulting in impaired ability to perform baseline ADLs and mobility. Patient reports assisting with ADLs and ambulating with therapy at facility. Patient currently able to transfer to side of bed with increased time and use of bed rail, min assist to stand with RW, limited standing tolerance due to orthostatic hypotension and set up assistance for UB ADLs and min-mod assist for LB ADLs in a predominantly seated position. Patient will benefit from skilled OT services to improve deficits and learn compensatory strategies as needed to improve functional abilities and independence. Patient needs continued short term rehab at discharge.      Follow Up Recommendations  SNF    Equipment Recommendations  None recommended by OT    Recommendations for Other Services       Precautions / Restrictions Precautions Precautions: Fall Precaution Comments: Orthostatic BP Restrictions Weight Bearing Restrictions: No      Mobility Bed Mobility Overal bed mobility: Needs Assistance Bed Mobility: Supine to Sit;Sit to Supine     Supine to sit: Min assist Sit to supine: Min guard   General bed mobility comments: cues to push through bottom elbow to assist in  uprighting trunk but ultimately requiring min A to fully upright trunk, min G for return to supine with cues for safety to place legs fully on mattress  Transfers Overall transfer level: Needs assistance Equipment used: Rolling walker (2 wheeled) Transfers: Sit to/from Stand Sit to Stand: Min assist         General transfer comment: cues to push from seated surface and return hands for controlled descent to sitting, min A to power up with hip extension occurring last    Balance Overall balance assessment: Needs assistance Sitting-balance support: Feet supported;No upper extremity supported Sitting balance-Leahy Scale: Fair Sitting balance - Comments: seated EOB   Standing balance support: During functional activity;Bilateral upper extremity supported Standing balance-Leahy Scale: Poor Standing balance comment: reliant on RW for steadying                           ADL either performed or assessed with clinical judgement   ADL Overall ADL's : Needs assistance/impaired Eating/Feeding: Independent   Grooming: Wash/dry hands;Sitting   Upper Body Bathing: Set up;Sitting   Lower Body Bathing: Minimal assistance;Set up;Sit to/from stand   Upper Body Dressing : Set up;Sitting   Lower Body Dressing: Moderate assistance;Set up;Sit to/from stand   Toilet Transfer: Minimal assistance;BSC;Stand-pivot   Toileting- Water quality scientist and Hygiene: Moderate assistance;Sit to/from stand       Functional mobility during ADLs: Minimal assistance;Rolling walker;Cueing for safety       Vision Patient Visual Report: No change from baseline       Perception     Praxis      Pertinent Vitals/Pain  Pain Assessment: No/denies pain     Hand Dominance Right   Extremity/Trunk Assessment Upper Extremity Assessment Upper Extremity Assessment: Overall WFL for tasks assessed   Lower Extremity Assessment Lower Extremity Assessment: Defer to PT evaluation RLE Deficits /  Details: 0/5 dorsiflexion due to chemotherapy; 3+/5 plantarflexion, knee extension, knee flexion, hip abd, hip flexion and hip adduction   Cervical / Trunk Assessment Cervical / Trunk Assessment: Normal   Communication Communication Communication: No difficulties   Cognition Arousal/Alertness: Awake/alert Behavior During Therapy: WFL for tasks assessed/performed;Flat affect Overall Cognitive Status: Within Functional Limits for tasks assessed                                     General Comments  Pt standing and performing marching in place with dizziness onset after ~3 minutes standing, BP 72/58 mmHg so returned to supine. Rechecked BP once supine and 132/24mmHg with dizziness resolved.    Exercises     Shoulder Instructions      Home Living Family/patient expects to be discharged to:: Skilled nursing facility                                        Prior Functioning/Environment Level of Independence: Needs assistance  Gait / Transfers Assistance Needed: currently at SNF pt able to transfer and ambulate with RW and staff assisting; ace bandage used to prevent foot drop when ambulating ADL's / Homemaking Assistance Needed: currently af SNF pt completes bed baths and dressing with nursing assisting   Comments: Pt with recent hospital stay and d/c to [facility] for rehab July 2021        OT Problem List: Decreased strength;Decreased activity tolerance;Impaired balance (sitting and/or standing);Pain      OT Treatment/Interventions: Self-care/ADL training;Therapeutic exercise;DME and/or AE instruction;Patient/family education;Therapeutic activities;Balance training    OT Goals(Current goals can be found in the care plan section) Acute Rehab OT Goals Patient Stated Goal: "get CT scan" OT Goal Formulation: With patient Time For Goal Achievement: 09/25/19 Potential to Achieve Goals: Good  OT Frequency: Min 2X/week   Barriers to  D/C:            Co-evaluation PT/OT/SLP Co-Evaluation/Treatment: Yes Reason for Co-Treatment: For patient/therapist safety;To address functional/ADL transfers PT goals addressed during session: Mobility/safety with mobility;Balance;Proper use of DME OT goals addressed during session: ADL's and self-care      AM-PAC OT "6 Clicks" Daily Activity     Outcome Measure Help from another person eating meals?: None Help from another person taking care of personal grooming?: A Little Help from another person toileting, which includes using toliet, bedpan, or urinal?: A Lot Help from another person bathing (including washing, rinsing, drying)?: A Little Help from another person to put on and taking off regular upper body clothing?: A Little Help from another person to put on and taking off regular lower body clothing?: A Lot 6 Click Score: 17   End of Session Equipment Utilized During Treatment: Gait belt;Rolling walker  Activity Tolerance: Other (comment) (Orthostatic BP) Patient left:    OT Visit Diagnosis: Muscle weakness (generalized) (M62.81);Pain;History of falling (Z91.81) Pain - part of body: Ankle and joints of foot                Time: 0960-4540 OT Time Calculation (min): 30 min Charges:  OT  General Charges $OT Visit: 1 Visit OT Evaluation $OT Eval Moderate Complexity: 1 Mod  Jamaree Hosier, OTR/L [place]  Office [phone] Pager: [place] 09/11/2019, 5:32 PM

## 2019-09-11 NOTE — Progress Notes (Signed)
IP PROGRESS NOTE  Subjective:   Clarence Andrews is well-known to me with a history of metastatic colon cancer.  He is currently being treated with FOLFOX chemotherapy and received the last cycle beginning on 08/27/2019.  He reports tolerated the chemotherapy well.  No nausea/vomiting or neuropathy symptoms.  He has persistent right foot drop and is participating in physical therapy at the skilled nursing facility. He developed recurrent diarrhea following chemotherapy.  He describes having a loose stool every few hours.  He was scheduled for restaging CTs yesterday and was noted to be hypotensive.  He was referred to the emergency room. He was admitted for further evaluation.  A stool sample returned positive for C. difficile. Objective: Vital signs in last 24 hours: Blood pressure 108/80, pulse 76, temperature 98.6 F (37 C), temperature source Oral, resp. rate 15, height 6' 2"  (1.88 m), weight 178 lb (80.7 kg), SpO2 97 %.  Intake/Output from previous day: 08/10 0701 - 08/11 0700 In: 2250 [P.O.:480; I.V.:770; IV Piggyback:1000] Out: 300 [Urine:300]  Physical Exam:  HEENT: No thrush Lungs: Clear anteriorly, no respiratory distress Cardiac: Regular rhythm Abdomen: No hepatomegaly, nontender Extremities: No leg edema Neurologic: 3-4/5 strength with dorsi flexion at the right foot  Portacath/PICC-without erythema  Lab Results: Recent Labs    09/10/19 0922 09/11/19 0330  WBC 22.4* 21.7*  HGB 10.4* 9.5*  HCT 33.1* 30.6*  PLT 138* 136*    BMET Recent Labs    09/10/19 0922 09/11/19 0330  NA 140 141  K 3.7 3.4*  CL 100 104  CO2 24 27  GLUCOSE 137* 83  BUN 21 22  CREATININE 1.48* 1.19  CALCIUM 8.9 8.5*    Lab Results  Component Value Date   CEA1 89.63 (H) 08/27/2019    Studies/Results: DG Chest Port 1 View  Result Date: 09/10/2019 CLINICAL DATA:  Weakness with leukocytosis. EXAM: PORTABLE CHEST 1 VIEW COMPARISON:  Chest x-ray dated July 09, 2019. FINDINGS: Unchanged  right chest wall port catheter. The heart size and mediastinal contours are within normal limits. Prior CABG and left atrial appendage clipping. Normal pulmonary vascularity. No focal consolidation, pleural effusion, or pneumothorax. No acute osseous abnormality. IMPRESSION: No active disease. Electronically Signed   By: Titus Dubin M.D.   On: 09/10/2019 12:17    Medications: I have reviewed the patient's current medications.  Assessment/Plan:  1. 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 Colonoscopy 03/07/2019-nearly completely obstructing mass beginning at 16 cm from the anal verge, sigmoid colon polyp ? CTs 03/15/2019-wall thickening of the lower sigmoid colon, low rectal mass with significant luminal narrowing, diffuse liver metastases, borderline enlarged sigmoid mesocolon, iliac, and upper abdominal lymph nodes ? Elevated CEA ? 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 Cycle 1 FOLFOX 04/11/2019 ? Cycle 2 FOLFOX 05/28/2019 ? Cycle 3 FOLFOX 06/11/2019 ? Cycle 4 FOLFOX 06/25/2019 ? CT abdomen/pelvis 07/10/2019-severe obstructing stricture at the sigmoid colon, stable to slight improvement in hepatic metastatic disease compared to February 2021 ? Cycle 5 FOLFOX 07/31/2019 ? Cycle 6 FOLFOX 08/13/2019 ? Cycle 7 FOLFOX 08/27/2019 2. 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 Diabetes 4. Coronary artery disease, status post coronary artery bypass surgery in 2017 5. Neutropenia following cycle 1 FOLFOX 6. Admission 04/28/2019 with COVID-19 infection 7. C. difficile colitis 04/28/2019 8. Atrial flutter during hospital admission  March/April 2021-treated with a Cardizem drip, digoxin, and Eliquis anticoagulation. Converted to  metoprolol at discharge.  Admission with recurrent rapid atrial fibrillation/flutter 07/09/2019 9. 07/09/2019-hospital admission for bowel obstruction  Sigmoidoscopy 07/12/2019-completely obstructing mass at 17-20 cm proximal to the anus-oozing present, stent placed 10. Fall 07/24/2019 with acute kidney injury/rhabdomyolysis 11. 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 13.  Right foot drop is likely due to weight loss/nerve compression 14.  C. 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 difficile colitis.  He is being treated with vancomycin.  The leukocytosis is likely related to G-CSF therapy given on 08/29/2019.  I have a low clinical suspicion for another systemic infection.  Mr. Cadden was scheduled to undergo restaging CTs yesterday.  He can complete the CT evaluation as an inpatient after the diarrhea improves.  Recommendations: 1.  Continue vancomycin for C. difficile colitis 2.  Physical therapy evaluation for the right foot drop 3.  Restaging CT abdomen/pelvis after diarrhea has improved 4.  Outpatient follow-up at the Cancer center as scheduled 09/17/2019 5.  Please call oncology as needed   LOS: 1 day   Betsy Coder, MD   09/11/2019, 3:21 PM

## 2019-09-11 NOTE — Progress Notes (Signed)
PROGRESS NOTE    Clarence Andrews.  ZOX:[id number] DOB: 12-Jul-1950 DOA: 09/10/2019 PCP: Patient, No Pcp Per   Chief Complaint  Patient presents with  . Hypotension   Brief Narrative:  Clarence Liska. is Clarence Andrews 69 y.o. male with Clarence Andrews past medical history of colon cancer status post 7 cycles of chemotherapy with FOLFOX, stent placement to his sigmoid colon for stricture from the cancer, history of C. difficile colitis in March, history of coronary artery disease, history of diabetes mellitus type 2 who presented from [facility] facility to undergo routine CT scan for his cancer staging.  While he was in the radiology department he felt weak and lightheaded.  His blood pressure was noted to be in the 60s.  He was promptly brought to the emergency department.  He was noted to have low blood pressure in the ED as well.  He was given IV fluids with improvement.  Blood work shows leukocytosis.  Upon questioning the patient he mentioned that for the past 1 to 2 weeks he has been having more diarrhea than usual.  He was diagnosed with C. difficile colitis back in March and was treated with oral vancomycin.  He denies any abdominal pain.  Denies any nausea vomiting.  No fever or chills.  He says he has had problems with his blood pressure and he is noted to be on midodrine.  He denies chest pain shortness of breath.  He has had about 5-6 bowel movements daily for the past few days.  Assessment & Plan:   Principal Problem:   Acute diarrhea Active Problems:   Cancer of left colon (HCC)   Hypotension  Assessment: This is Clarence Andrews 69 year old Caucasian male who lives in [facility] who has Clarence Andrews history of colon cancer who was noted to be hypotensive when he was in the radiology department.  He reports more diarrhea than usual over the last many days.  He has Clarence Agostini leukocytosis.  He has Clarence Andrews previous history of C. difficile.  Concern is for recurrence of his C. difficile diarrhea.    Plan:  1. Acute  on chronic diarrhea in the setting of Clarence Andrews history of C. difficile:  Continue vancomycin - may need taper with recurrence, will review timing of other infections Positive toxigenic C diff PCR (negative C diff toxin) Continue to follow diarrhea  2.  Hypotension: Patient has Clarence Andrews history of orthostatic hypotension.  He is noted to be on midodrine.  Patient's blood pressure was low in the radiology department with systolic in the 81X. Suspected hypovolemia from diarrhea. Continue to follow, improved today Continue midodrine  3.  Metastatic colon cancer: Under the care of Dr. [name].  He is receiving chemotherapy.  His last infusion was on 7/29.  It looks like he also got PEG filgrastim on that day.  This could be the reason for his leukocytosis.  He has stent in his sigmoid colon for his stricture. Follow CT scan abd/pelvis   4.  Diabetes mellitus type 2: Monitor CBGs.  Not on insulin at home.  HbA1c was 7.8 in June.  5. History of atrial flutter/atrial fibrillation: Not thought to be Clarence Andrews good candidate for anticoagulation per previous notes.   Continue digoxin (level 0.6) Holding metoprolol with hypotension on presentation, follow for resumption   6.  History of COPD: Stable  7.  Anemia of chronic disease: Monitor hemoglobin.  No evidence of overt bleeding.  8.  Chronic combined systolic and diastolic CHF: Most recent echocardiogram  in June with preserved ejection fraction.  Currently euvolemic.  9. History of coronary artery disease status post CABG: Stable  10. Wounds bilateral feet: States that he is followed by podiatry.  They were examined yesterday and were noted to be improving.  DVT prophylaxis: lovenox Code Status: full  Family Communication: none at bedside Disposition:   Status is: Inpatient  Remains inpatient appropriate because:Inpatient level of care appropriate due to severity of illness   Dispo: The patient is from: SNF              Anticipated d/c is to:  SNF              Anticipated d/c date is: > 3 days              Patient currently is not medically stable to d/c.   Consultants:   oncology  Procedures: (  none  Antimicrobials:  Anti-infectives (From admission, onward)   Start     Dose/Rate Route Frequency Ordered Stop   09/10/19 2200  vancomycin (VANCOCIN) 50 mg/mL oral solution 125 mg     Discontinue     125 mg Oral 4 times daily 09/10/19 1739 09/20/19 2159     Subjective: Says diarrhea is less  Objective: Vitals:   09/11/19 0852 09/11/19 1100 09/11/19 1423 09/11/19 1700  BP:  100/78 108/80   Pulse:  80 76   Resp: 15   17  Temp:  98 F (36.7 C) 98.6 F (37 C)   TempSrc:  Oral Oral   SpO2:  98% 97%   Weight:      Height:        Intake/Output Summary (Last 24 hours) at 09/11/2019 1853 Last data filed at 09/11/2019 1800 Gross per 24 hour  Intake 2653.45 ml  Output 550 ml  Net 2103.45 ml   Filed Weights   09/10/19 0852 09/11/19 0232  Weight: 87 kg 80.7 kg    Examination:  General exam: Appears calm and comfortable  Respiratory system: Clear to auscultation. Respiratory effort normal. Cardiovascular system: S1 & S2 heard, RRR. Gastrointestinal system: Abdomen is nondistended, soft and nontender.  Central nervous system: Alert and oriented. No focal neurological deficits. Extremities: no LEE Skin: No rashes, lesions or ulcers Psychiatry: Judgement and insight appear normal. Mood & affect appropriate.     Data Reviewed: I have personally reviewed following labs and imaging studies  CBC: Recent Labs  Lab 09/10/19 0922 09/11/19 0330  WBC 22.4* 21.7*  NEUTROABS 18.6*  --   HGB 10.4* 9.5*  HCT 33.1* 30.6*  MCV 92.2 92.7  PLT 138* 136*    Basic Metabolic Panel: Recent Labs  Lab 09/10/19 0922 09/11/19 0330  NA 140 141  K 3.7 3.4*  CL 100 104  CO2 24 27  GLUCOSE 137* 83  BUN 21 22  CREATININE 1.48* 1.19  CALCIUM 8.9 8.5*    GFR: Estimated Creatinine Clearance: 65 mL/min (by C-Andrews formula  based on SCr of 1.19 mg/dL).  Liver Function Tests: Recent Labs  Lab 09/11/19 0330  AST 22  ALT 13  ALKPHOS 177*  BILITOT 0.7  PROT 6.0*  ALBUMIN 2.8*    CBG: Recent Labs  Lab 09/10/19 0819 09/11/19 0244 09/11/19 0820 09/11/19 1228 09/11/19 1607  GLUCAP 126* 76 91 119* 94     Recent Results (from the past 240 hour(s))  Blood culture (routine x 2)     Status: None (Preliminary result)   Collection Time: 09/10/19  1:52 PM  Specimen: BLOOD  Result Value Ref Range Status   Specimen Description   Final    BLOOD RIGHT ANTECUBITAL Performed at [facility] [address], [place], [place] [address]    Special Requests   Final    BOTTLES DRAWN AEROBIC AND ANAEROBIC Blood Culture adequate volume Performed at [facility] [address], [place], [place] [address]    Culture   Final    NO GROWTH < 24 HOURS Performed at [facility] [address], [place], [place] [address]    Report Status PENDING  Incomplete  SARS Coronavirus 2 by RT PCR (hospital order, performed in [facility] hospital lab) Nasopharyngeal Nasopharyngeal Swab     Status: None   Collection Time: 09/10/19  3:33 PM   Specimen: Nasopharyngeal Swab  Result Value Ref Range Status   SARS Coronavirus 2 NEGATIVE NEGATIVE Final    Comment: (NOTE) SARS-CoV-2 target nucleic acids are NOT DETECTED.  The SARS-CoV-2 RNA is generally detectable in upper and lower respiratory specimens during the acute phase of infection. The lowest concentration of SARS-CoV-2 viral copies this assay can detect is 250 copies / mL. Clarence Andrews negative result does not preclude SARS-CoV-2 infection and should not be used as the sole basis for treatment or other patient management decisions.  Clarence Andrews negative result may occur with improper specimen collection / handling, submission of specimen other than nasopharyngeal swab, presence of viral mutation(s) within the areas targeted by this assay, and  inadequate number of viral copies (<250 copies / mL). Clarence Andrews negative result must be combined with clinical observations, patient history, and epidemiological information.  Fact Sheet for Patients:   StrictlyIdeas.no  Fact Sheet for Healthcare Providers: BankingDealers.co.za  This test is not yet approved or  cleared by the [place] FDA and has been authorized for detection and/or diagnosis of SARS-CoV-2 by FDA under an Emergency Use Authorization (EUA).  This EUA will remain in effect (meaning this test can be used) for the duration of the COVID-19 declaration under Section 564(b)(1) of the Act, 21 U.S.C. section 360bbb-3(b)(1), unless the authorization is terminated or revoked sooner.  Performed at [facility], [place] [address] [place], [place] [address]   C Difficile Quick Screen w PCR reflex     Status: Abnormal   Collection Time: 09/10/19 10:39 PM   Specimen: STOOL  Result Value Ref Range Status   C Diff antigen POSITIVE (Ahlayah Tarkowski) NEGATIVE Final   C Diff toxin NEGATIVE NEGATIVE Final   C Diff interpretation Results are indeterminate. See PCR results.  Final    Comment: Performed at [facility], [place] [address] [place], [place] [address]  Gastrointestinal Panel by PCR , Stool     Status: None   Collection Time: 09/10/19 10:39 PM   Specimen: STOOL  Result Value Ref Range Status   Campylobacter species NOT DETECTED NOT DETECTED Final   Plesimonas shigelloides NOT DETECTED NOT DETECTED Final   Salmonella species NOT DETECTED NOT DETECTED Final   Yersinia enterocolitica NOT DETECTED NOT DETECTED Final   Vibrio species NOT DETECTED NOT DETECTED Final   Vibrio cholerae NOT DETECTED NOT DETECTED Final   Enteroaggregative E coli (EAEC) NOT DETECTED NOT DETECTED Final   Enteropathogenic E coli (EPEC) NOT DETECTED NOT DETECTED Final   Enterotoxigenic E coli (ETEC) NOT DETECTED NOT DETECTED Final   Shiga  like toxin producing E coli (STEC) NOT DETECTED NOT DETECTED Final   Shigella/Enteroinvasive E coli (EIEC) NOT DETECTED NOT  DETECTED Final   Cryptosporidium NOT DETECTED NOT DETECTED Final   Cyclospora cayetanensis NOT DETECTED NOT DETECTED Final   Entamoeba histolytica NOT DETECTED NOT DETECTED Final   Giardia lamblia NOT DETECTED NOT DETECTED Final   Adenovirus F40/41 NOT DETECTED NOT DETECTED Final   Astrovirus NOT DETECTED NOT DETECTED Final   Norovirus GI/GII NOT DETECTED NOT DETECTED Final   Rotavirus [place] Franson NOT DETECTED NOT DETECTED Final   Sapovirus (I, II, IV, and V) NOT DETECTED NOT DETECTED Final    Comment: Performed at [facility], [address] [place], [place] [address]  C. Diff by PCR, Reflexed     Status: Abnormal   Collection Time: 09/10/19 10:39 PM  Result Value Ref Range Status   Toxigenic C. Difficile by PCR POSITIVE (Clarence Ghan) NEGATIVE Final    Comment: Positive for toxigenic C. difficile with little to no toxin production. Only treat if clinical presentation suggests symptomatic illness. Performed at [facility], [place] [address], [place], [place] [address]   MRSA PCR Screening     Status: None   Collection Time: 09/11/19  7:05 AM   Specimen: Nasal Mucosa; Nasopharyngeal  Result Value Ref Range Status   MRSA by PCR NEGATIVE NEGATIVE Final    Comment:        The GeneXpert MRSA Assay (FDA approved for NASAL specimens only), is one component of Aizlynn Digilio comprehensive MRSA colonization surveillance program. It is not intended to diagnose MRSA infection nor to guide or monitor treatment for MRSA infections. Performed at [facility], [place] [address] [place], [place] [address]          Radiology Studies: [facility] Chest Port 1 View  Result Date: 09/10/2019 CLINICAL DATA:  Weakness with leukocytosis. EXAM: PORTABLE CHEST 1 VIEW COMPARISON:  Chest x-ray dated July 09, 2019. FINDINGS: Unchanged right chest wall port catheter. The heart size and  mediastinal contours are within normal limits. Prior CABG and left atrial appendage clipping. Normal pulmonary vascularity. No focal consolidation, pleural effusion, or pneumothorax. No acute osseous abnormality. IMPRESSION: No active disease. Electronically Signed   By: Titus Dubin M.D.   On: 09/10/2019 12:17        Scheduled Meds: . Chlorhexidine Gluconate Cloth  6 each Topical Daily  . digoxin  0.25 mg Oral Daily  . enoxaparin (LOVENOX) injection  40 mg Subcutaneous Q24H  . feeding supplement (KATE FARMS STANDARD 1.4)  325 mL Oral BID BM  . insulin aspart  0-15 Units Subcutaneous TID WC  . multivitamin with minerals  1 tablet Oral Daily  . mupirocin cream   Topical Daily  . sodium chloride (PF)      . sodium chloride flush  10-40 mL Intracatheter Q12H  . vancomycin  125 mg Oral QID   Continuous Infusions:   LOS: 1 day    Time spent: over 30 min    Fayrene Helper, MD [facility]   To contact the attending provider between 7A-7P or the covering provider during after hours 7P-7A, please log into the web site [email or web address] and access using universal [place] password for that web site. If you do not have the password, please call the hospital operator.  09/11/2019, 6:53 PM

## 2019-09-11 NOTE — Progress Notes (Signed)
Initial Nutrition Assessment  DOCUMENTATION CODES:   Not applicable  INTERVENTION:  - will order [employer] 1.4 po BID, each supplement provides 455 kcal and 20 grams protein. - will order 1 tablet multivitamin with minerals/day. - will complete NFPE at follow-up.   NUTRITION DIAGNOSIS:   Increased nutrient needs related to acute illness, cancer and cancer related treatments as evidenced by estimated needs.  GOAL:   Patient will meet greater than or equal to 90% of their needs  MONITOR:   PO intake, Supplement acceptance, Labs, Weight trends  REASON FOR ASSESSMENT:   Malnutrition Screening Tool  ASSESSMENT:   69 y.o. male with a medical history of colon cancer s/p 7 cycles of chemo with FOLFOX, stent placement to sigmoid colon for stricture from cancer, C. diffi in 04/2019, CAD, and type 2 DM. He presented from SNF to undergo routine CT scan for his cancer staging.  While he was in the radiology department he felt weak and lightheaded.  His blood pressure was noted to be in the 60s. He was transferred to the ED and given IV fluids. In the ED, he reported that for 1-2 weeks he has been having more diarrhea than usual; 5-6 BMs for the few days PTA. No N/V or abdominal pain.  Patient is currently out of the room to CT. Flow sheet documentation indicates patient ate 0% of breakfast and lunch today.   Patient assessed by [place] RD on 5/25 due to poor appetite but at that time patient had reported his appetite improved and he was feeling much better.   Weight today is 178 lb and weight on 07/09/19 was 224 lb. This indicates 46 lb weight loss (20% body weight) in the past 3 months; significant for time frame.  Highly suspect patient meets criteria for malnutrition, but unable to confirm without talking to patient and completing NFPE.   He was seen by SLP this afternoon and regular, thin liquid diet was recommended.    Labs reviewed; CBGs: 76, 91, 119, 94 mg/dl, K: 3.4 mmol/l,  Ca: 8.5 mg/dl. Medications reviewed; sliding scale novolog.     NUTRITION - FOCUSED PHYSICAL EXAM:  unable to complete at this time.   Diet Order:   Diet Order            Diet Heart Room service appropriate? Yes; Fluid consistency: Thin  Diet effective now                 EDUCATION NEEDS:   No education needs have been identified at this time  Skin:  Skin Assessment: Skin Integrity Issues: Skin Integrity Issues:: Other (Comment) Other: non-pressure injury to L ankle  Last BM:  8/11 (type 6)  Height:   Ht Readings from Last 1 Encounters:  09/11/19 6\' 2"  (1.88 m)    Weight:   Wt Readings from Last 1 Encounters:  09/11/19 80.7 kg     Estimated Nutritional Needs:  Kcal:  2250-2475 kcal Protein:  100-115 grams Fluid:  >/= 2.5 L/day     Jarome Matin, MS, RD, LDN, CNSC Inpatient Clinical Dietitian RD pager # available in AMION  After hours/weekend pager # available in [facility]

## 2019-09-11 NOTE — Evaluation (Signed)
Clinical/Bedside Swallow Evaluation Patient Details  Name: Clarence Andrews. MRN: [id number] Date of Birth: [birth year]  Today's Date: 09/11/2019 Time: SLP Start Time (ACUTE ONLY): 1233 SLP Stop Time (ACUTE ONLY): 1249 SLP Time Calculation (min) (ACUTE ONLY): 16 min  Past Medical History:  Past Medical History:  Diagnosis Date  . CAD (coronary artery disease) 2017   CABG  . Cancer ([place])   . CHF (congestive heart failure) ([place])   . Colitis   . COVID-19 04/29/2019  . Diabetes ([place])   . Paronychia of second toe of right foot   . Throat cancer [facility])    Past Surgical History:  Past Surgical History:  Procedure Laterality Date  . APPENDECTOMY  1970  . COLONIC STENT PLACEMENT N/A 07/12/2019   Procedure: COLONIC STENT PLACEMENT;  Surgeon: Irving Copas., MD;  Location: [facility];  Service: Gastroenterology;  Laterality: N/A;  . CORONARY ARTERY BYPASS GRAFT     5 vessel   . FLEXIBLE SIGMOIDOSCOPY N/A 07/12/2019   Procedure: FLEXIBLE SIGMOIDOSCOPY;  Surgeon: Rush Landmark Telford Nab., MD;  Location: [facility];  Service: Gastroenterology;  Laterality: N/A;  . IR IMAGING GUIDED PORT INSERTION  04/04/2019  . TOOTH EXTRACTION     HPI:  Clarence Andrews. is a 69 y.o. male with a past medical history of colon cancer status post 7 cycles of chemotherapy with FOLFOX, throat cancer (with radiation "years ago"), COPD, coronary artery disease, diabetes mellitus type 2 who presented from a skilled nursing facility to undergo routine CT scan for his cancer staging and became weak and lightheaded, blood pressure in the 60s. Blood work shows leukocytosis; has acute on chronic diarrhea. CXR No active disease.   Assessment / Plan / Recommendation Clinical Impression  Pt reports difficulty swallowing large pills since "I had Covid a few weeks ago". Neck palpated and found to have fibrotic tissue s/p radiation "years ago" for throat cancer. Consumption of straw sips thin and solid texture were  within normal limits and no indications of decreased airway protection. Provided education re: relationship of radiation and potential dysphagia. Notified him of SLP at outpatient who can work with pt on a home exercise program to keep tissue as pliable and protective as possible. Continue regular/thin liquids, cut larger pills in half with water.   SLP Visit Diagnosis: Dysphagia, unspecified (R13.10)    Aspiration Risk  Mild aspiration risk    Diet Recommendation Regular;Thin liquid   Liquid Administration via: Straw;Cup Medication Administration: Whole meds with liquid (large pills cut in 1/2) Supervision: Patient able to self feed Postural Changes: Seated upright at 90 degrees    Other  Recommendations Oral Care Recommendations: Oral care BID   Follow up Recommendations  (outpt ST in future if symptoms worsen)      Frequency and Duration            Prognosis        Swallow Study   General HPI: Clarence Andrews. is a 69 y.o. male with a past medical history of colon cancer status post 7 cycles of chemotherapy with FOLFOX, throat cancer (with radiation "years ago"), COPD, coronary artery disease, diabetes mellitus type 2 who presented from a skilled nursing facility to undergo routine CT scan for his cancer staging and became weak and lightheaded, blood pressure in the 60s. Blood work shows leukocytosis; has acute on chronic diarrhea. CXR No active disease. Type of Study: Bedside Swallow Evaluation Previous Swallow Assessment:  (none) Diet Prior to this Study: Regular;Thin liquids Temperature Spikes Noted:  No Respiratory Status: Room air History of Recent Intubation: No Behavior/Cognition: Alert;Cooperative;Pleasant mood Oral Cavity Assessment: Within Functional Limits Oral Care Completed by SLP: No Oral Cavity - Dentition: Other (Comment);Edentulous (implants not present) Vision: Functional for self-feeding Self-Feeding Abilities: Able to feed self Patient Positioning:  Upright in bed Baseline Vocal Quality: Normal Volitional Cough: Strong Volitional Swallow: Able to elicit    Oral/Motor/Sensory Function Overall Oral Motor/Sensory Function: Within functional limits   Ice Chips Ice chips: Not tested   Thin Liquid Thin Liquid: Within functional limits Presentation: Straw    Nectar Thick Nectar Thick Liquid: Not tested   Honey Thick Honey Thick Liquid: Not tested   Puree Puree: Not tested   Solid     Solid: Within functional limits      Houston Siren 09/11/2019,1:09 PM Orbie Pyo Colvin Caroli.Ed Risk analyst [phone] Office [phone]

## 2019-09-11 NOTE — Consult Note (Addendum)
[facility] Nurse Consult Note: Reason for Consult: Consult requested for BLE.  Pt states he is followed as an outpatient by a podiatrist and wounds are improving.  Wound type: Right outer ankle with chronic full thickness wound; 1X.5X.1cm, dry yellow wound bed, no odor, drainage, or fluctuance.  Right anterior foot with 2 areas of chronic full thickness wounds; .5X1X.1cm and .5X.5X.1cm, dark red and dry, no odor, drainage, or fluctuance. Left outer ankle with chronic full thickness wound; 1.25X1.25X.1cm, 10% yellow, 90% red dry wound bed, no odor, drainage, or fluctuance. Dressing procedure/placement/frequency: Topical treatment orders provided for bedside nurses to perform Q day to promote moist healing as follows: Apply Bactroban to left and right outer ankles and RLE wounds Q day, then cover with foam dressings.  (Change foam dressings Q 3 days or PRN soiling.) Pt can resume follow-up with this podiatrist after discharge.  Please re-consult if further assistance is needed.  Thank-you,  Julien Girt MSN, [place], [place], [place], [place]

## 2019-09-12 ENCOUNTER — Other Ambulatory Visit: Payer: Self-pay | Admitting: Oncology

## 2019-09-12 DIAGNOSIS — R197 Diarrhea, unspecified: Secondary | ICD-10-CM | POA: Diagnosis not present

## 2019-09-12 LAB — COMPREHENSIVE METABOLIC PANEL
ALT: 11 U/L (ref 0–44)
AST: 19 U/L (ref 15–41)
Albumin: 2.6 g/dL — ABNORMAL LOW (ref 3.5–5.0)
Alkaline Phosphatase: 156 U/L — ABNORMAL HIGH (ref 38–126)
Anion gap: 9 (ref 5–15)
BUN: 16 mg/dL (ref 8–23)
CO2: 27 mmol/L (ref 22–32)
Calcium: 8.1 mg/dL — ABNORMAL LOW (ref 8.9–10.3)
Chloride: 102 mmol/L (ref 98–111)
Creatinine, Ser: 1.09 mg/dL (ref 0.61–1.24)
GFR calc Af Amer: >60 mL/min (ref >60)
GFR calc non Af Amer: >60 mL/min (ref >60)
Glucose, Bld: 94 mg/dL (ref 70–99)
Potassium: 3.5 mmol/L (ref 3.5–5.1)
Sodium: 138 mmol/L (ref 135–145)
Total Bilirubin: 0.3 mg/dL (ref 0.3–1.2)
Total Protein: 5.4 g/dL — ABNORMAL LOW (ref 6.5–8.1)

## 2019-09-12 LAB — CBC WITH DIFFERENTIAL/PLATELET
Abs Immature Granulocytes: 0.84 10*3/uL — ABNORMAL HIGH (ref 0.00–0.07)
Basophils Absolute: 0.1 10*3/uL (ref 0.0–0.1)
Basophils Relative: 0 %
Eosinophils Absolute: 0.2 10*3/uL (ref 0.0–0.5)
Eosinophils Relative: 1 %
HCT: 28.1 % — ABNORMAL LOW (ref 39.0–52.0)
Hemoglobin: 8.9 g/dL — ABNORMAL LOW (ref 13.0–17.0)
Immature Granulocytes: 5 %
Lymphocytes Relative: 9 %
Lymphs Abs: 1.5 10*3/uL (ref 0.7–4.0)
MCH: 29.1 pg (ref 26.0–34.0)
MCHC: 31.7 g/dL (ref 30.0–36.0)
MCV: 91.8 fL (ref 80.0–100.0)
Monocytes Absolute: 1.5 10*3/uL — ABNORMAL HIGH (ref 0.1–1.0)
Monocytes Relative: 9 %
Neutro Abs: 12.8 10*3/uL — ABNORMAL HIGH (ref 1.7–7.7)
Neutrophils Relative %: 76 %
Platelets: 131 10*3/uL — ABNORMAL LOW (ref 150–400)
RBC: 3.06 MIL/uL — ABNORMAL LOW (ref 4.22–5.81)
RDW: 16.2 % — ABNORMAL HIGH (ref 11.5–15.5)
WBC: 17 10*3/uL — ABNORMAL HIGH (ref 4.0–10.5)
nRBC: 0.1 % (ref 0.0–0.2)

## 2019-09-12 LAB — GLUCOSE, CAPILLARY
Glucose-Capillary: 93 mg/dL (ref 70–99)
Glucose-Capillary: 147 mg/dL — ABNORMAL HIGH (ref 70–99)
Glucose-Capillary: 149 mg/dL — ABNORMAL HIGH (ref 70–99)
Glucose-Capillary: 161 mg/dL — ABNORMAL HIGH (ref 70–99)

## 2019-09-12 LAB — PROCALCITONIN: Procalcitonin: 0.12 ng/mL

## 2019-09-12 LAB — MAGNESIUM: Magnesium: 1.6 mg/dL — ABNORMAL LOW (ref 1.7–2.4)

## 2019-09-12 LAB — PHOSPHORUS: Phosphorus: 2.2 mg/dL — ABNORMAL LOW (ref 2.5–4.6)

## 2019-09-12 MED ORDER — LACTATED RINGERS IV BOLUS
1000.0000 mL | Freq: Once | INTRAVENOUS | Status: AC
  Administered 2019-09-12: 1000 mL via INTRAVENOUS

## 2019-09-12 MED ORDER — MAGNESIUM SULFATE 2 GM/50ML IV SOLN
2.0000 g | Freq: Once | INTRAVENOUS | Status: AC
  Administered 2019-09-12: 2 g via INTRAVENOUS
  Filled 2019-09-12: qty 50

## 2019-09-12 MED ORDER — MIDODRINE HCL 5 MG PO TABS
5.0000 mg | ORAL_TABLET | Freq: Three times a day (TID) | ORAL | Status: DC
  Administered 2019-09-13 – 2019-09-14 (×6): 5 mg via ORAL
  Filled 2019-09-12 (×6): qty 1

## 2019-09-12 MED ORDER — LACTATED RINGERS IV BOLUS: 1000.0000 mL | Freq: Once | INTRAVENOUS | Status: DC
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
## 2019-09-12 NOTE — Progress Notes (Signed)
IP PROGRESS NOTE  Subjective:   Clarence Andrews reports no further diarrhea.  No complaint. Objective: Vital signs in last 24 hours: Blood pressure 98/65, pulse 88, temperature 98.5 F (36.9 C), temperature source Oral, resp. rate 20, height 6' 2"  (1.88 m), weight 178 lb (80.7 kg), SpO2 100 %.  Intake/Output from previous day: 08/11 0701 - 08/12 0700 In: 1403.5 [P.O.:750; I.V.:653.5] Out: 2450 [Urine:2450]  Physical Exam:   Neurologic: 3-4/5 strength with dorsi flexion at the right foot  Portacath/PICC-without erythema  Lab Results: Recent Labs    09/11/19 0330 09/12/19 0413  WBC 21.7* 17.0*  HGB 9.5* 8.9*  HCT 30.6* 28.1*  PLT 136* 131*    BMET Recent Labs    09/11/19 0330 09/12/19 0413  NA 141 138  K 3.4* 3.5  CL 104 102  CO2 27 27  GLUCOSE 83 94  BUN 22 16  CREATININE 1.19 1.09  CALCIUM 8.5* 8.1*    Lab Results  Component Value Date   CEA1 89.63 (H) 08/27/2019    Studies/Results: CT ABDOMEN PELVIS W CONTRAST  Result Date: 09/12/2019 CLINICAL DATA:  Metastatic colon cancer. History of sigmoid colon stent. EXAM: CT ABDOMEN AND PELVIS WITH CONTRAST TECHNIQUE: Multidetector CT imaging of the abdomen and pelvis was performed using the standard protocol following bolus administration of intravenous contrast. CONTRAST:  125m OMNIPAQUE IOHEXOL 300 MG/ML  SOLN COMPARISON:  07/10/2019 FINDINGS: Lower chest: The lung bases are clear of an acute process. No pulmonary nodules are identified. No pleural effusion. The heart is normal in size. Hepatobiliary: Stable appearing diffuse hepatic metastatic disease. Two adjacent lesions at the hepatic dome on image 14/2 measure 5.6 x 3.9 cm and previously measured 6.5 x 4.1 cm. Segment 6 lesion on image 25/2 measures 3.5 x 3.3 cm and previously measured 4.5 x 3.4 cm. No new lesions or progressive findings. Small gallstones noted in the gallbladder. No findings for acute cholecystitis. Normal caliber common bile duct. Pancreas:  Stable pancreatic atrophy. There is a subtle low-attenuation lesion in the lower pancreatic head/uncinate process. This measures approximately 14 mm and previously measured approximately 10 mm. MRI abdomen without and with contrast may be helpful for further evaluation if clinically necessary given the patient's disease process. Spleen: Normal size. No focal lesions. Adrenals/Urinary Tract: The adrenal glands and kidneys are stable. Bilateral renal cysts are noted. No worrisome renal lesions or hydronephrosis. The bladder is slightly thick walled which could be due to partial bladder outlet obstruction from an enlarged prostate gland. Stomach/Bowel: The stomach, duodenum and small bowel are unremarkable. Stable metallic stent in the sigmoid colon. No findings for colonic obstruction. Vascular/Lymphatic: Normal and stable aorta and branch vessels. No atherosclerotic calcifications or aneurysm. The major venous structures are patent. Small scattered mesenteric and retroperitoneal lymph nodes are stable. Reproductive: Prostate gland is mildly enlarged. The seminal vesicles appear normal. Other: Mild presacral edema possibly radiation related. Small lymph nodes in the sigmoid mesocolon are stable. Musculoskeletal: No significant bony findings. IMPRESSION: 1. Stable to slightly improved diffuse hepatic metastatic disease. No new or progressive findings. 2. Slight interval enlargement of the low-attenuation lesion in the lower pancreatic head/uncinate process. Recommend MRI abdomen without and with contrast for further evaluation if clinically necessary. 3. Stable metallic stent in the sigmoid colon. No findings for colonic obstruction. 4. Cholelithiasis. 5. Stable small scattered mesenteric and retroperitoneal lymph nodes. No new or progressive findings. 6. Stable presacral edema and small lymph nodes in the sigmoid mesocolon. 7. Aortic atherosclerosis. Aortic Atherosclerosis (ICD10-I70.0). Electronically Signed  By: Marijo Sanes M.D.   On: 09/12/2019 05:49    Medications: I have reviewed the patient's current medications.  Assessment/Plan:  1. 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 Colonoscopy 03/07/2019-nearly completely obstructing mass beginning at 16 cm from the anal verge, sigmoid colon polyp ? CTs 03/15/2019-wall thickening of the lower sigmoid colon, low rectal mass with significant luminal narrowing, diffuse liver metastases, borderline enlarged sigmoid mesocolon, iliac, and upper abdominal lymph nodes ? Elevated CEA ? 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 Cycle 1 FOLFOX 04/11/2019 ? Cycle 2 FOLFOX 05/28/2019 ? Cycle 3 FOLFOX 06/11/2019 ? Cycle 4 FOLFOX 06/25/2019 ? CT abdomen/pelvis 07/10/2019-severe obstructing stricture at the sigmoid colon, stable to slight improvement in hepatic metastatic disease compared to February 2021 ? Cycle 5 FOLFOX 07/31/2019 ? Cycle 6 FOLFOX 08/13/2019 ? Cycle 7 FOLFOX 08/27/2019 ? CT abdomen/pelvis 09/12/2019-stable and slightly decreased hepatic lesions, no evidence of progressive metastatic disease, slight enlargement of a previously noted pancreas head/uncinate lesion, no evidence of colonic obstruction, stable small mesenteric and retroperitoneal nodes 2. 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 Diabetes 4. Coronary artery disease, status post coronary artery bypass surgery in 2017 5. Neutropenia following cycle 1 FOLFOX 6. Admission 04/28/2019 with COVID-19 infection 7. C. difficile colitis 04/28/2019 8. Atrial flutter during hospital admission March/April 2021-treated with a Cardizem drip, digoxin, and Eliquis anticoagulation. Converted to metoprolol at discharge.  Admission with  recurrent rapid atrial fibrillation/flutter 07/09/2019 9. 07/09/2019-hospital admission for bowel obstruction  Sigmoidoscopy 07/12/2019-completely obstructing mass at 17-20 cm proximal to the anus-oozing present, stent placed 10. Fall 07/24/2019 with acute kidney injury/rhabdomyolysis 11. 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 13.  Right foot drop is likely due to weight loss/nerve compression 14.  C. 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  He continues vancomycin.  The restaging abdomen CT reveals slightly improved versus stable disease.  No new site of metastatic disease.  I discussed treatment options with Clarence Andrews.  I recommend continuing FOLFOX for now.  We will consider switching to FOLFIRI or a maintenance regimen after several more cycles of FOLFOX.  We will check the CEA when he returns on 09/17/2019.  Recommendations: 1.  Continue vancomycin for C. difficile colitis 2.  Physical therapy evaluation for the right foot drop 3.  Outpatient follow-up at the Cancer center 09/17/2019 for an office visit and FOLFOX chemotherapy 4.  Please call oncology as needed   LOS: 2 days   Betsy Coder, MD   09/12/2019, 5:17 PM

## 2019-09-12 NOTE — Progress Notes (Signed)
PROGRESS NOTE    [name]  HWE:[id number] DOB: [birth year] DOA: 09/10/2019 PCP: Patient, No Pcp Per   Chief Complaint  Patient presents with  . Hypotension   Brief Narrative:  Clarence Andrews. is Clarence Andrews 69 y.o. male with Gabe Glace past medical history of colon cancer status post 7 cycles of chemotherapy with FOLFOX, stent placement to his sigmoid colon for stricture from the cancer, history of C. difficile colitis in March, history of coronary artery disease, history of diabetes mellitus type 2 who [facility] to undergo routine CT scan for his cancer staging.  While he was in the radiology department he felt weak and lightheaded.  His blood pressure was noted to be in the 60s.  He was promptly brought to the emergency department.  He was noted to have low blood pressure in the ED as well.  He was given IV fluids with improvement.  Blood work shows leukocytosis.  Upon questioning the patient he mentioned that for the past 1 to 2 weeks he has been having more diarrhea than usual.  He was diagnosed with C. difficile colitis back in March and was treated with oral vancomycin.  He denies any abdominal pain.  Denies any nausea vomiting.  No fever or chills.  He says he has had problems with his blood pressure and he is noted to be on midodrine.  He denies chest pain shortness of breath.  He has had about 5-6 bowel movements daily for the past few days.  Assessment & Plan:   Principal Problem:   Acute diarrhea Active Problems:   Cancer of left colon (HCC)   Hypotension  Assessment: This is Clarence Andrews 69 year old Caucasian male who lives [facility] who has [name] history of colon cancer who was noted to be hypotensive when he was in the radiology department.  He reports more diarrhea than usual over the last many days.  He has [name] leukocytosis.  He has [name] previous history of C. difficile.  Concern is for recurrence of his C. difficile diarrhea.    Plan:  1. Acute  on chronic diarrhea in the setting of [name] history of C. difficile:  Continue vancomycin - may need taper with recurrence, will review timing of other infections Positive toxigenic C diff PCR (negative C diff toxin) Continue to follow diarrhea - seems to be improving  2.  Hypotension: Patient has Hugh Garrow history of orthostatic hypotension.  He is noted to be on midodrine.  Patient's blood pressure was low in the radiology department with systolic in the 89F. Suspected hypovolemia from diarrhea. Follow orthostatics Resume midodrine  3.  Metastatic colon cancer: Under the care of Dr. [name].  He is receiving chemotherapy.  His last infusion was on 7/29.  It looks like he also got PEG filgrastim on that day.  This could be the reason for his leukocytosis.  He has stent in his sigmoid colon for his stricture. Follow CT scan abd/pelvis - with stable to improved diffuse hepatic metastatic disease - interval enlargement of low attenuation lesion in lower pancreatic head/uncinate process (consider MRI) - stable metallic stent in sigmoid colon (see report)  4.  Diabetes mellitus type 2: Monitor CBGs.  Not on insulin at home.  HbA1c was 7.8 in June.  5. History of atrial flutter/atrial fibrillation: Not thought to be [name] good candidate for anticoagulation per previous notes.   Continue digoxin (level 0.6) Holding metoprolol with hypotension on presentation, follow for resumption  6.  History of COPD: Stable  7.  Anemia of chronic disease: Monitor hemoglobin.  No evidence of overt bleeding.  8.  Chronic combined systolic and diastolic CHF: Most recent echocardiogram in June with preserved ejection fraction.  Currently euvolemic.  9. History of coronary artery disease status post CABG: Stable  10. Wounds bilateral feet: States that he is followed by podiatry.  They were examined pta and were noted to be improving.  DVT prophylaxis: lovenox Code Status: full  Family Communication: none at  bedside Disposition:   Status is: Inpatient  Remains inpatient appropriate because:Inpatient level of care appropriate due to severity of illness   Dispo: The patient is from: SNF              Anticipated d/c is to: SNF              Anticipated d/c date is: > 3 days              Patient currently is not medically stable to d/c.   Consultants:   oncology  Procedures: (  none  Antimicrobials:  Anti-infectives (From admission, onward)   Start     Dose/Rate Route Frequency Ordered Stop   09/10/19 2200  vancomycin (VANCOCIN) 50 mg/mL oral solution 125 mg     Discontinue     125 mg Oral 4 times daily 09/10/19 1739 09/20/19 2159     Subjective: Continued improvement in diarrhea Did c/o LH with standing  Objective: Vitals:   09/11/19 2257 09/12/19 0551 09/12/19 0826 09/12/19 1207  BP: 105/67 123/83  98/65  Pulse: 91 84  88  Resp: 14 14 15 20   Temp: 97.6 F (36.4 C) 97.6 F (36.4 C)  98.5 F (36.9 C)  TempSrc: Oral Oral  Oral  SpO2: 99% 100%  100%  Weight:      Height:        Intake/Output Summary (Last 24 hours) at 09/12/2019 1803 Last data filed at 09/12/2019 1400 Gross per 24 hour  Intake 50 ml  Output 2525 ml  Net -2475 ml   Filed Weights   09/10/19 0852 09/11/19 0232  Weight: 87 kg 80.7 kg    Examination:  General: No acute distress. Cardiovascular: Heart sounds show Melenie Minniear regular rate, and rhythm Lungs: Clear to auscultation bilaterally  Abdomen: Soft, nontender, nondistended  Neurological: Alert and oriented 3. Moves all extremities 4. Cranial nerves II through XII grossly intact. Skin: Warm and dry. No rashes or lesions. Extremities: No clubbing or cyanosis. No edema.   Data Reviewed: I have personally reviewed following labs and imaging studies  CBC: Recent Labs  Lab 09/10/19 0922 09/11/19 0330 09/12/19 0413  WBC 22.4* 21.7* 17.0*  NEUTROABS 18.6*  --  12.8*  HGB 10.4* 9.5* 8.9*  HCT 33.1* 30.6* 28.1*  MCV 92.2 92.7 91.8  PLT 138* 136*  131*    Basic Metabolic Panel: Recent Labs  Lab 09/10/19 0922 09/11/19 0330 09/12/19 0413  NA 140 141 138  K 3.7 3.4* 3.5  CL 100 104 102  CO2 24 27 27   GLUCOSE 137* 83 94  BUN 21 22 16   CREATININE 1.48* 1.19 1.09  CALCIUM 8.9 8.5* 8.1*  MG  --   --  1.6*  PHOS  --   --  2.2*    GFR: Estimated Creatinine Clearance: 71 mL/min (by C-G formula based on SCr of 1.09 mg/dL).  Liver Function Tests: Recent Labs  Lab 09/11/19 0330 09/12/19 0413  AST 22 19  ALT  13 11  ALKPHOS 177* 156*  BILITOT 0.7 0.3  PROT 6.0* 5.4*  ALBUMIN 2.8* 2.6*    CBG: Recent Labs  Lab 09/11/19 1607 09/11/19 2259 09/12/19 0803 09/12/19 1138 09/12/19 1618  GLUCAP 94 112* 93 147* 149*     Recent Results (from the past 240 hour(s))  Blood culture (routine x 2)     Status: None (Preliminary result)   Collection Time: 09/10/19  1:52 PM   Specimen: BLOOD  Result Value Ref Range Status   Specimen Description   Final    BLOOD RIGHT ANTECUBITAL Performed at [facility], [place] [address] [place], [place] [address]    Special Requests   Final    BOTTLES DRAWN AEROBIC AND ANAEROBIC Blood Culture adequate volume Performed at [place] [address] [place], [place] [address]    Culture   Final    NO GROWTH 2 DAYS Performed at [place] [address] [place], [place] [address]    Report Status PENDING  Incomplete  SARS Coronavirus 2 by RT PCR (hospital order, performed in [facility] hospital lab) Nasopharyngeal Nasopharyngeal Swab     Status: None   Collection Time: 09/10/19  3:33 PM   Specimen: Nasopharyngeal Swab  Result Value Ref Range Status   SARS Coronavirus 2 NEGATIVE NEGATIVE Final    Comment: (NOTE) SARS-CoV-2 target nucleic acids are NOT DETECTED.  The SARS-CoV-2 RNA is generally detectable in upper and lower respiratory specimens during the acute phase of infection. The lowest concentration of SARS-CoV-2 viral copies this  assay can detect is 250 copies / mL. Seleta Hovland negative result does not preclude SARS-CoV-2 infection and should not be used as the sole basis for treatment or other patient management decisions.  Auren Valdes negative result may occur with improper specimen collection / handling, submission of specimen other than nasopharyngeal swab, presence of viral mutation(s) within the areas targeted by this assay, and inadequate number of viral copies (<250 copies / mL). Jaselle Pryer negative result must be combined with clinical observations, patient history, and epidemiological information.  Fact Sheet for Patients:   StrictlyIdeas.no  Fact Sheet for Healthcare Providers: BankingDealers.co.za  This test is not yet approved or  cleared by the [place] FDA and has been authorized for detection and/or diagnosis of SARS-CoV-2 by FDA under an Emergency Use Authorization (EUA).  This EUA will remain in effect (meaning this test can be used) for the duration of the COVID-19 declaration under Section 564(b)(1) of the Act, 21 U.S.C. section 360bbb-3(b)(1), unless the authorization is terminated or revoked sooner.  Performed at [facility], [place] [address] [place], [place] [address]   Urine culture     Status: Abnormal   Collection Time: 09/10/19 10:39 PM   Specimen: Urine, Clean Catch  Result Value Ref Range Status   Specimen Description   Final    URINE, CLEAN CATCH Performed at [facility], [place] [address] [place], [place] [address]    Special Requests   Final    NONE Performed at [facility], [place] [address], [place],  [address]    Culture 30,000 COLONIES/mL YEAST (Eriq Hufford)  Final   Report Status 09/11/2019 FINAL  Final  C Difficile Quick Screen w PCR reflex     Status: Abnormal   Collection Time: 09/10/19 10:39 PM   Specimen: STOOL  Result Value Ref Range Status   C Diff antigen POSITIVE (Raneem Mendolia) NEGATIVE  Final   C Diff  toxin NEGATIVE NEGATIVE Final   C Diff interpretation Results are indeterminate. See PCR results.  Final    Comment: Performed at [facility], [place] [address] [place], [place] [address]  Gastrointestinal Panel by PCR , Stool     Status: None   Collection Time: 09/10/19 10:39 PM   Specimen: STOOL  Result Value Ref Range Status   Campylobacter species NOT DETECTED NOT DETECTED Final   Plesimonas shigelloides NOT DETECTED NOT DETECTED Final   Salmonella species NOT DETECTED NOT DETECTED Final   Yersinia enterocolitica NOT DETECTED NOT DETECTED Final   Vibrio species NOT DETECTED NOT DETECTED Final   Vibrio cholerae NOT DETECTED NOT DETECTED Final   Enteroaggregative E coli (EAEC) NOT DETECTED NOT DETECTED Final   Enteropathogenic E coli (EPEC) NOT DETECTED NOT DETECTED Final   Enterotoxigenic E coli (ETEC) NOT DETECTED NOT DETECTED Final   Shiga like toxin producing E coli (STEC) NOT DETECTED NOT DETECTED Final   Shigella/Enteroinvasive E coli (EIEC) NOT DETECTED NOT DETECTED Final   Cryptosporidium NOT DETECTED NOT DETECTED Final   Cyclospora cayetanensis NOT DETECTED NOT DETECTED Final   Entamoeba histolytica NOT DETECTED NOT DETECTED Final   Giardia lamblia NOT DETECTED NOT DETECTED Final   Adenovirus F40/41 NOT DETECTED NOT DETECTED Final   Astrovirus NOT DETECTED NOT DETECTED Final   Norovirus GI/GII NOT DETECTED NOT DETECTED Final   Rotavirus Shahil Speegle NOT DETECTED NOT DETECTED Final   Sapovirus (I, II, IV, and V) NOT DETECTED NOT DETECTED Final    Comment: Performed at [facility], [place] [place], [place] [address]  C. Diff by PCR, Reflexed     Status: Abnormal   Collection Time: 09/10/19 10:39 PM  Result Value Ref Range Status   Toxigenic C. Difficile by PCR POSITIVE (Sherlyne Crownover) NEGATIVE Final    Comment: Positive for toxigenic C. difficile with little to no toxin production. Only treat if clinical presentation suggests symptomatic  illness. Performed at [facility], [place] [address], [place], [place] [address]   Blood culture (routine x 2)     Status: None (Preliminary result)   Collection Time: 09/11/19  4:10 AM   Specimen: BLOOD  Result Value Ref Range Status   Specimen Description   Final    BLOOD RIGHT ARM Performed at [place] [address] [place], [place] [address]    Special Requests   Final    BOTTLES DRAWN AEROBIC AND ANAEROBIC Blood Culture adequate volume Performed at [place] [address], [place], [place] [address]    Culture   Final    NO GROWTH 1 DAY Performed at [facility], [place] [address] [place], [place] [address]    Report Status PENDING  Incomplete  MRSA PCR Screening     Status: None   Collection Time: 09/11/19  7:05 AM   Specimen: Nasal Mucosa; Nasopharyngeal  Result Value Ref Range Status   MRSA by PCR NEGATIVE NEGATIVE Final    Comment:        The GeneXpert MRSA Assay (FDA approved for NASAL specimens only), is one component of Denicia Pagliarulo comprehensive MRSA colonization surveillance program. It is not intended to diagnose MRSA infection nor to guide or monitor treatment for MRSA infections. Performed at [facility], [place] [address] [place], [place] [address]          Radiology Studies: CT ABDOMEN PELVIS W CONTRAST  Result Date: 09/12/2019 CLINICAL DATA:  Metastatic colon cancer.  History of sigmoid colon stent. EXAM: CT ABDOMEN AND PELVIS WITH CONTRAST TECHNIQUE: Multidetector CT imaging of the abdomen and pelvis was performed using the standard protocol following bolus administration of intravenous contrast. CONTRAST:  192mL OMNIPAQUE IOHEXOL 300 MG/ML  SOLN COMPARISON:  07/10/2019 FINDINGS: Lower chest: The lung bases are clear of an acute process. No pulmonary nodules are identified. No pleural effusion. The heart is normal in size. Hepatobiliary: Stable appearing diffuse hepatic metastatic  disease. Two adjacent lesions at the hepatic dome on image 14/2 measure 5.6 x 3.9 cm and previously measured 6.5 x 4.1 cm. Segment 6 lesion on image 25/2 measures 3.5 x 3.3 cm and previously measured 4.5 x 3.4 cm. No new lesions or progressive findings. Small gallstones noted in the gallbladder. No findings for acute cholecystitis. Normal caliber common bile duct. Pancreas: Stable pancreatic atrophy. There is Torria Fromer subtle low-attenuation lesion in the lower pancreatic head/uncinate process. This measures approximately 14 mm and previously measured approximately 10 mm. MRI abdomen without and with contrast may be helpful for further evaluation if clinically necessary given the patient's disease process. Spleen: Normal size. No focal lesions. Adrenals/Urinary Tract: The adrenal glands and kidneys are stable. Bilateral renal cysts are noted. No worrisome renal lesions or hydronephrosis. The bladder is slightly thick walled which could be due to partial bladder outlet obstruction from an enlarged prostate gland. Stomach/Bowel: The stomach, duodenum and small bowel are unremarkable. Stable metallic stent in the sigmoid colon. No findings for colonic obstruction. Vascular/Lymphatic: Normal and stable aorta and branch vessels. No atherosclerotic calcifications or aneurysm. The major venous structures are patent. Small scattered mesenteric and retroperitoneal lymph nodes are stable. Reproductive: Prostate gland is mildly enlarged. The seminal vesicles appear normal. Other: Mild presacral edema possibly radiation related. Small lymph nodes in the sigmoid mesocolon are stable. Musculoskeletal: No significant bony findings. IMPRESSION: 1. Stable to slightly improved diffuse hepatic metastatic disease. No new or progressive findings. 2. Slight interval enlargement of the low-attenuation lesion in the lower pancreatic head/uncinate process. Recommend MRI abdomen without and with contrast for further evaluation if clinically  necessary. 3. Stable metallic stent in the sigmoid colon. No findings for colonic obstruction. 4. Cholelithiasis. 5. Stable small scattered mesenteric and retroperitoneal lymph nodes. No new or progressive findings. 6. Stable presacral edema and small lymph nodes in the sigmoid mesocolon. 7. Aortic atherosclerosis. Aortic Atherosclerosis (ICD10-I70.0). Electronically Signed   By: Marijo Sanes M.D.   On: 09/12/2019 05:49        Scheduled Meds: . Chlorhexidine Gluconate Cloth  6 each Topical Daily  . digoxin  0.25 mg Oral Daily  . enoxaparin (LOVENOX) injection  40 mg Subcutaneous Q24H  . feeding supplement (KATE FARMS STANDARD 1.4)  325 mL Oral BID BM  . insulin aspart  0-15 Units Subcutaneous TID WC  . multivitamin with minerals  1 tablet Oral Daily  . mupirocin cream   Topical Daily  . sodium chloride flush  10-40 mL Intracatheter Q12H  . vancomycin  125 mg Oral QID   Continuous Infusions:   LOS: 2 days    Time spent: over 30 min    Fayrene Helper, MD [facility]   To contact the attending provider between 7A-7P or the covering provider during after hours 7P-7A, please log into the web site [email or web address] and access using universal [place] password for that web site. If you do not have the password, please call the hospital operator.  09/12/2019, 6:03 PM
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
## 2019-09-13 LAB — CBC WITH DIFFERENTIAL/PLATELET
Abs Immature Granulocytes: 0.77 10*3/uL — ABNORMAL HIGH (ref 0.00–0.07)
Basophils Absolute: 0.1 10*3/uL (ref 0.0–0.1)
Basophils Relative: 0 %
Eosinophils Absolute: 0.3 10*3/uL (ref 0.0–0.5)
Eosinophils Relative: 2 %
HCT: 28.6 % — ABNORMAL LOW (ref 39.0–52.0)
Hemoglobin: 9 g/dL — ABNORMAL LOW (ref 13.0–17.0)
Immature Granulocytes: 5 %
Lymphocytes Relative: 10 %
Lymphs Abs: 1.5 10*3/uL (ref 0.7–4.0)
MCH: 29 pg (ref 26.0–34.0)
MCHC: 31.5 g/dL (ref 30.0–36.0)
MCV: 92.3 fL (ref 80.0–100.0)
Monocytes Absolute: 1.5 10*3/uL — ABNORMAL HIGH (ref 0.1–1.0)
Monocytes Relative: 10 %
Neutro Abs: 12 10*3/uL — ABNORMAL HIGH (ref 1.7–7.7)
Neutrophils Relative %: 73 %
Platelets: 155 10*3/uL (ref 150–400)
RBC: 3.1 MIL/uL — ABNORMAL LOW (ref 4.22–5.81)
RDW: 16 % — ABNORMAL HIGH (ref 11.5–15.5)
WBC: 16.2 10*3/uL — ABNORMAL HIGH (ref 4.0–10.5)
nRBC: 0.1 % (ref 0.0–0.2)

## 2019-09-13 LAB — GLUCOSE, CAPILLARY
Glucose-Capillary: 118 mg/dL — ABNORMAL HIGH (ref 70–99)
Glucose-Capillary: 178 mg/dL — ABNORMAL HIGH (ref 70–99)
Glucose-Capillary: 140 mg/dL — ABNORMAL HIGH (ref 70–99)
Glucose-Capillary: 128 mg/dL — ABNORMAL HIGH (ref 70–99)

## 2019-09-13 LAB — COMPREHENSIVE METABOLIC PANEL
ALT: 11 U/L (ref 0–44)
AST: 18 U/L (ref 15–41)
Albumin: 2.7 g/dL — ABNORMAL LOW (ref 3.5–5.0)
Alkaline Phosphatase: 158 U/L — ABNORMAL HIGH (ref 38–126)
Anion gap: 11 (ref 5–15)
BUN: 14 mg/dL (ref 8–23)
CO2: 27 mmol/L (ref 22–32)
Calcium: 8.3 mg/dL — ABNORMAL LOW (ref 8.9–10.3)
Chloride: 103 mmol/L (ref 98–111)
Creatinine, Ser: 1.03 mg/dL (ref 0.61–1.24)
GFR calc Af Amer: >60 mL/min (ref >60)
GFR calc non Af Amer: >60 mL/min (ref >60)
Glucose, Bld: 125 mg/dL — ABNORMAL HIGH (ref 70–99)
Potassium: 3.7 mmol/L (ref 3.5–5.1)
Sodium: 141 mmol/L (ref 135–145)
Total Bilirubin: 0.4 mg/dL (ref 0.3–1.2)
Total Protein: 5.6 g/dL — ABNORMAL LOW (ref 6.5–8.1)

## 2019-09-13 LAB — MAGNESIUM: Magnesium: 1.9 mg/dL (ref 1.7–2.4)

## 2019-09-13 LAB — PHOSPHORUS: Phosphorus: 2.1 mg/dL — ABNORMAL LOW (ref 2.5–4.6)

## 2019-09-13 NOTE — Care Management Important Message (Signed)
Important Message  Patient Details IM Letter given to the Patient Name: Clarence Andrews. MRN: [id number] Date of Birth: 05-18-1950   Medicare Important Message Given:  Yes     Kerin Salen 09/13/2019, 10:34 AM

## 2019-09-13 NOTE — Progress Notes (Signed)
PROGRESS NOTE    [name]  QHU:[id number] DOB: May 06, 1950 DOA: 09/10/2019 PCP: Patient, No Pcp Per   Chief Complaint  Patient presents with  . Hypotension   Brief Narrative:  Clarence Andrews. is Clarence Andrews 69 y.o. male with Giordana Weinheimer past medical history of colon cancer status post 7 cycles of chemotherapy with FOLFOX, stent placement to his sigmoid colon for stricture from the cancer, history of C. difficile colitis in March, history of coronary artery disease, history of diabetes mellitus type 2 who presented from [facility] skilled nursing facility to undergo routine CT scan for his cancer staging.  While he was in the radiology department he felt weak and lightheaded.  His blood pressure was noted to be in the 60s.  He was promptly brought to the emergency department.  He was noted to have low blood pressure in the ED as well.  He was given IV fluids with improvement.  Blood work shows leukocytosis.  Upon questioning the patient he mentioned that for the past 1 to 2 weeks he has been having more diarrhea than usual.  He was diagnosed with C. difficile colitis back in March and was treated with oral vancomycin.  He denies any abdominal pain.  Denies any nausea vomiting.  No fever or chills.  He says he has had problems with his blood pressure and he is noted to be on midodrine.  He denies chest pain shortness of breath.  He has had about 5-6 bowel movements daily for the past few days.  Assessment & Plan:   Principal Problem:   Acute diarrhea Active Problems:   Cancer of left colon (HCC)   Hypotension  Assessment: This is Clarence Andrews 69 year old Caucasian male who lives in [facility] who has Clarence Andrews history of colon cancer who was noted to be hypotensive when he was in the radiology department.  He reports more diarrhea than usual over the last many days.  He has Clarence Andrews leukocytosis.  He has Clarence Andrews previous history of C. difficile.  Concern is for recurrence of his C. difficile diarrhea.    Plan:  1. Acute  on chronic diarrhea in the setting of Clarence Andrews history of C. difficile:  Continue vancomycin - may need taper with recurrence, will review timing of other infections Positive toxigenic C diff PCR (negative C diff toxin) Continue to follow diarrhea - seems to be improving  2.  Orthostatic Hypotension  Hypotension: Patient has Clarence Andrews history of orthostatic hypotension.  He is noted to be on midodrine.  Patient's blood pressure was low in the radiology department with systolic in the 46F. Suspected hypovolemia from diarrhea as well as chronic orthostatic hypotension. Follow orthostatics - still with significant orthostatic hypotension - thigh high ted hose, s/p IVF, continue midodrine  3.  Metastatic colon cancer: Under the care of Dr. [name].  He is receiving chemotherapy.  His last infusion was on 7/29.  It looks like he also got PEG filgrastim on that day.  This could be the reason for his leukocytosis.  He has stent in his sigmoid colon for his stricture. Follow CT scan abd/pelvis - with stable to improved diffuse hepatic metastatic disease - interval enlargement of low attenuation lesion in lower pancreatic head/uncinate process (consider MRI) - stable metallic stent in sigmoid colon (see report) Will need follow up outpatient  4.  Diabetes mellitus type 2: Monitor CBGs.  Not on insulin at home.  HbA1c was 7.8 in June.  5. History of atrial flutter/atrial fibrillation: Not thought  to be Clarence Andrews good candidate for anticoagulation per previous notes.   Continue digoxin (level 0.6) Holding metoprolol with hypotension on presentation, follow for resumption   6.  History of COPD: Stable  7.  Anemia of chronic disease: Monitor hemoglobin.  No evidence of overt bleeding.  8.  Chronic combined systolic and diastolic CHF: Most recent echocardiogram in June with preserved ejection fraction.  Currently euvolemic.  9. History of coronary artery disease status post CABG: Stable  10. Wounds bilateral feet:  States that he is followed by podiatry.  They were examined pta and were noted to be improving.  DVT prophylaxis: lovenox Code Status: full  Family Communication: none at bedside Disposition:   Status is: Inpatient  Remains inpatient appropriate because:Inpatient level of care appropriate due to severity of illness   Dispo: The patient is from: SNF              Anticipated d/c is to: SNF              Anticipated d/c date is: > 3 days              Patient currently is not medically stable to d/c.   Consultants:   oncology  Procedures: (  none  Antimicrobials:  Anti-infectives (From admission, onward)   Start     Dose/Rate Route Frequency Ordered Stop   09/10/19 2200  vancomycin (VANCOCIN) 50 mg/mL oral solution 125 mg     Discontinue     125 mg Oral 4 times daily 09/10/19 1739 09/20/19 2159     Subjective: No diarrhea Denies LH with sitting up  Objective: Vitals:   09/12/19 1207 09/12/19 2232 09/13/19 0632 09/13/19 1218  BP: 98/65 123/71 (!) 113/91 115/76  Pulse: 88 89 91 85  Resp: 20 20 12 20   Temp: 98.5 F (36.9 C) 97.8 F (36.6 C) 97.7 F (36.5 C) 97.9 F (36.6 C)  TempSrc: Oral Oral Oral Oral  SpO2: 100% 100% 99% 98%  Weight:      Height:        Intake/Output Summary (Last 24 hours) at 09/13/2019 1419 Last data filed at 09/13/2019 0700 Gross per 24 hour  Intake --  Output 900 ml  Net -900 ml   Filed Weights   09/10/19 0852 09/11/19 0232  Weight: 87 kg 80.7 kg    Examination:  General: No acute distress. Cardiovascular: Heart sounds show Kailey Esquilin regular rate, and rhythm. Lungs: Clear to auscultation bilaterally  Abdomen: Soft, nontender, nondistended  Neurological: Alert and oriented 3. Moves all extremities 4. Cranial nerves II through XII grossly intact. Skin: Warm and dry. No rashes or lesions. Extremities: No clubbing or cyanosis. No edema.   Data Reviewed: I have personally reviewed following labs and imaging studies  CBC: Recent Labs   Lab 09/10/19 0922 09/11/19 0330 09/12/19 0413 09/13/19 0415  WBC 22.4* 21.7* 17.0* 16.2*  NEUTROABS 18.6*  --  12.8* 12.0*  HGB 10.4* 9.5* 8.9* 9.0*  HCT 33.1* 30.6* 28.1* 28.6*  MCV 92.2 92.7 91.8 92.3  PLT 138* 136* 131* 016    Basic Metabolic Panel: Recent Labs  Lab 09/10/19 0922 09/11/19 0330 09/12/19 0413 09/13/19 0415  NA 140 141 138 141  K 3.7 3.4* 3.5 3.7  CL 100 104 102 103  CO2 24 27 27 27   GLUCOSE 137* 83 94 125*  BUN 21 22 16 14   CREATININE 1.48* 1.19 1.09 1.03  CALCIUM 8.9 8.5* 8.1* 8.3*  MG  --   --  1.6* 1.9  PHOS  --   --  2.2* 2.1*    GFR: Estimated Creatinine Clearance: 75.1 mL/min (by C-G formula based on SCr of 1.03 mg/dL).  Liver Function Tests: Recent Labs  Lab 09/11/19 0330 09/12/19 0413 09/13/19 0415  AST 22 19 18   ALT 13 11 11   ALKPHOS 177* 156* 158*  BILITOT 0.7 0.3 0.4  PROT 6.0* 5.4* 5.6*  ALBUMIN 2.8* 2.6* 2.7*    CBG: Recent Labs  Lab 09/12/19 1138 09/12/19 1618 09/12/19 2235 09/13/19 0744 09/13/19 1214  GLUCAP 147* 149* 161* 118* 178*     Recent Results (from the past 240 hour(s))  Blood culture (routine x 2)     Status: None (Preliminary result)   Collection Time: 09/10/19  1:52 PM   Specimen: BLOOD  Result Value Ref Range Status   Specimen Description   Final    BLOOD RIGHT ANTECUBITAL Performed at [facility], [place] [address], [place], [place] [address]    Special Requests   Final    BOTTLES DRAWN AEROBIC AND ANAEROBIC Blood Culture adequate volume Performed at [place] [address], [place], [place] [address]    Culture   Final    NO GROWTH 3 DAYS Performed at [facility], [place] [address], [place], [place] [address]    Report Status PENDING  Incomplete  SARS Coronavirus 2 by RT PCR (hospital order, performed in [facility] hospital lab) Nasopharyngeal Nasopharyngeal Swab     Status: None   Collection Time: 09/10/19  3:33 PM   Specimen: Nasopharyngeal  Swab  Result Value Ref Range Status   SARS Coronavirus 2 NEGATIVE NEGATIVE Final    Comment: (NOTE) SARS-CoV-2 target nucleic acids are NOT DETECTED.  The SARS-CoV-2 RNA is generally detectable in upper and lower respiratory specimens during the acute phase of infection. The lowest concentration of SARS-CoV-2 viral copies this assay can detect is 250 copies / mL. Jeffrey Graefe negative result does not preclude SARS-CoV-2 infection and should not be used as the sole basis for treatment or other patient management decisions.  Jerritt Cardoza negative result may occur with improper specimen collection / handling, submission of specimen other than nasopharyngeal swab, presence of viral mutation(s) within the areas targeted by this assay, and inadequate number of viral copies (<250 copies / mL). Senetra Dillin negative result must be combined with clinical observations, patient history, and epidemiological information.  Fact Sheet for Patients:   StrictlyIdeas.no  Fact Sheet for Healthcare Providers: BankingDealers.co.za  This test is not yet approved or  cleared by the [place] FDA and has been authorized for detection and/or diagnosis of SARS-CoV-2 by FDA under an Emergency Use Authorization (EUA).  This EUA will remain in effect (meaning this test can be used) for the duration of the COVID-19 declaration under Section 564(b)(1) of the Act, 21 U.S.C. section 360bbb-3(b)(1), unless the authorization is terminated or revoked sooner.  Performed at [facility], [place] [address] [place], [place] [address]   Urine culture     Status: Abnormal   Collection Time: 09/10/19 10:39 PM   Specimen: Urine, Clean Catch  Result Value Ref Range Status   Specimen Description   Final    URINE, CLEAN CATCH Performed at [facility], [place] [address], [place], [place] [address]    Special Requests   Final    NONE Performed at [facility], [place] [address], [place], [place] [address]    Culture 30,000 COLONIES/mL  YEAST (Zane Pellecchia)  Final   Report Status 09/11/2019 FINAL  Final  C Difficile Quick Screen w PCR reflex     Status: Abnormal   Collection Time: 09/10/19 10:39 PM   Specimen: STOOL  Result Value Ref Range Status   C Diff antigen POSITIVE (Korion Cuevas) NEGATIVE Final   C Diff toxin NEGATIVE NEGATIVE Final   C Diff interpretation Results are indeterminate. See PCR results.  Final    Comment: Performed at [facility], [place] [address] [place], [place] [address]  Gastrointestinal Panel by PCR , Stool     Status: None   Collection Time: 09/10/19 10:39 PM   Specimen: STOOL  Result Value Ref Range Status   Campylobacter species NOT DETECTED NOT DETECTED Final   Plesimonas shigelloides NOT DETECTED NOT DETECTED Final   Salmonella species NOT DETECTED NOT DETECTED Final   Yersinia enterocolitica NOT DETECTED NOT DETECTED Final   Vibrio species NOT DETECTED NOT DETECTED Final   Vibrio cholerae NOT DETECTED NOT DETECTED Final   Enteroaggregative E coli (EAEC) NOT DETECTED NOT DETECTED Final   Enteropathogenic E coli (EPEC) NOT DETECTED NOT DETECTED Final   Enterotoxigenic E coli (ETEC) NOT DETECTED NOT DETECTED Final   Shiga like toxin producing E coli (STEC) NOT DETECTED NOT DETECTED Final   Shigella/Enteroinvasive E coli (EIEC) NOT DETECTED NOT DETECTED Final   Cryptosporidium NOT DETECTED NOT DETECTED Final   Cyclospora cayetanensis NOT DETECTED NOT DETECTED Final   Entamoeba histolytica NOT DETECTED NOT DETECTED Final   Giardia lamblia NOT DETECTED NOT DETECTED Final   Adenovirus F40/41 NOT DETECTED NOT DETECTED Final   Astrovirus NOT DETECTED NOT DETECTED Final   Norovirus GI/GII NOT DETECTED NOT DETECTED Final   Rotavirus Ansel Ferrall NOT DETECTED NOT DETECTED Final   Sapovirus (I, II, IV, and V) NOT DETECTED NOT DETECTED Final    Comment: Performed at [facility], [address] [place], [place]  [address]  C. Diff by PCR, Reflexed     Status: Abnormal   Collection Time: 09/10/19 10:39 PM  Result Value Ref Range Status   Toxigenic C. Difficile by PCR POSITIVE (Himani Corona) NEGATIVE Final    Comment: Positive for toxigenic C. difficile with little to no toxin production. Only treat if clinical presentation suggests symptomatic illness. Performed at [facility], [place] [address], [place], [place] [address]   Blood culture (routine x 2)     Status: None (Preliminary result)   Collection Time: 09/11/19  4:10 AM   Specimen: BLOOD  Result Value Ref Range Status   Specimen Description   Final    BLOOD RIGHT ARM Performed at [place] [address] [place], [place] [address]    Special Requests   Final    BOTTLES DRAWN AEROBIC AND ANAEROBIC Blood Culture adequate volume Performed at [place] [address], [place], [place] [address]    Culture   Final    NO GROWTH 2 DAYS Performed at [place] [address], [place], [place] [address]    Report Status PENDING  Incomplete  MRSA PCR Screening     Status: None   Collection Time: 09/11/19  7:05 AM   Specimen: Nasal Mucosa; Nasopharyngeal  Result Value Ref Range Status   MRSA by PCR NEGATIVE NEGATIVE Final    Comment:        The GeneXpert MRSA Assay (FDA approved for NASAL specimens only), is one component of Antoni Stefan comprehensive MRSA colonization surveillance program.  It is not intended to diagnose MRSA infection nor to guide or monitor treatment for MRSA infections. Performed at [facility], [place] [address], [place], [place] [address]          Radiology Studies: CT ABDOMEN PELVIS W CONTRAST  Result Date: 09/12/2019 CLINICAL DATA:  Metastatic colon cancer. History of sigmoid colon stent. EXAM: CT ABDOMEN AND PELVIS WITH CONTRAST TECHNIQUE: Multidetector CT imaging of the abdomen and pelvis was performed using the standard protocol following bolus  administration of intravenous contrast. CONTRAST:  174mL OMNIPAQUE IOHEXOL 300 MG/ML  SOLN COMPARISON:  07/10/2019 FINDINGS: Lower chest: The lung bases are clear of an acute process. No pulmonary nodules are identified. No pleural effusion. The heart is normal in size. Hepatobiliary: Stable appearing diffuse hepatic metastatic disease. Two adjacent lesions at the hepatic dome on image 14/2 measure 5.6 x 3.9 cm and previously measured 6.5 x 4.1 cm. Segment 6 lesion on image 25/2 measures 3.5 x 3.3 cm and previously measured 4.5 x 3.4 cm. No new lesions or progressive findings. Small gallstones noted in the gallbladder. No findings for acute cholecystitis. Normal caliber common bile duct. Pancreas: Stable pancreatic atrophy. There is Winefred Hillesheim subtle low-attenuation lesion in the lower pancreatic head/uncinate process. This measures approximately 14 mm and previously measured approximately 10 mm. MRI abdomen without and with contrast may be helpful for further evaluation if clinically necessary given the patient's disease process. Spleen: Normal size. No focal lesions. Adrenals/Urinary Tract: The adrenal glands and kidneys are stable. Bilateral renal cysts are noted. No worrisome renal lesions or hydronephrosis. The bladder is slightly thick walled which could be due to partial bladder outlet obstruction from an enlarged prostate gland. Stomach/Bowel: The stomach, duodenum and small bowel are unremarkable. Stable metallic stent in the sigmoid colon. No findings for colonic obstruction. Vascular/Lymphatic: Normal and stable aorta and branch vessels. No atherosclerotic calcifications or aneurysm. The major venous structures are patent. Small scattered mesenteric and retroperitoneal lymph nodes are stable. Reproductive: Prostate gland is mildly enlarged. The seminal vesicles appear normal. Other: Mild presacral edema possibly radiation related. Small lymph nodes in the sigmoid mesocolon are stable. Musculoskeletal: No  significant bony findings. IMPRESSION: 1. Stable to slightly improved diffuse hepatic metastatic disease. No new or progressive findings. 2. Slight interval enlargement of the low-attenuation lesion in the lower pancreatic head/uncinate process. Recommend MRI abdomen without and with contrast for further evaluation if clinically necessary. 3. Stable metallic stent in the sigmoid colon. No findings for colonic obstruction. 4. Cholelithiasis. 5. Stable small scattered mesenteric and retroperitoneal lymph nodes. No new or progressive findings. 6. Stable presacral edema and small lymph nodes in the sigmoid mesocolon. 7. Aortic atherosclerosis. Aortic Atherosclerosis (ICD10-I70.0). Electronically Signed   By: Marijo Sanes M.D.   On: 09/12/2019 05:49        Scheduled Meds: . Chlorhexidine Gluconate Cloth  6 each Topical Daily  . digoxin  0.25 mg Oral Daily  . enoxaparin (LOVENOX) injection  40 mg Subcutaneous Q24H  . feeding supplement (KATE FARMS STANDARD 1.4)  325 mL Oral BID BM  . insulin aspart  0-15 Units Subcutaneous TID WC  . midodrine  5 mg Oral TID WC  . multivitamin with minerals  1 tablet Oral Daily  . mupirocin cream   Topical Daily  . sodium chloride flush  10-40 mL Intracatheter Q12H  . vancomycin  125 mg Oral QID   Continuous Infusions:   LOS: 3 days    Time spent: over 30 min  Fayrene Helper, MD [facility]   To contact the attending provider between 7A-7P or the covering provider during after hours 7P-7A, please log into the web site [email or web address] and access using universal [place] password for that web site. If you do not have the password, please call the hospital operator.  09/13/2019, 2:19 PM

## 2019-09-13 NOTE — Progress Notes (Signed)
Physical Therapy Treatment Patient Details Name: Clarence Andrews. MRN: [id number] DOB: 12/29/1950 Today's Date: 09/13/2019    History of Present Illness 69 y.o. 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 difficile colitis in March, CAD, diabetes mellitus type 2 who presented from a skilled nursing facility to undergo routine CT scan for his cancer staging and was found to be hypotensive with c/o weakness and lightheaded.    PT Comments    Initiated treatment with orthostatic BP to determine pt tolerance to transferring to seated EOB. Supine BP 119/80mmHg and with HOB elevated at max 112/73 mmHg after ~3 minutes without dizziness complaints. Encouraged pt to sit EOB and educated on benefits, but pt declines due to being too tired. Pt able to scoot up in bed when cued to push BLE into mattress to scoot body up to [facility] and therapist providing min A. Pt performs quad sets with isometric hold and educated to perform again today for strengthening. Pt's BP improving and hope to progress to sitting EOB, transfers and gait as BP maintains stable. RN notified of pt's BP and session. Patient will benefit from continued physical therapy in hospital and recommendations below to increase strength, balance, endurance for safe ADLs and gait.   Follow Up Recommendations  SNF     Equipment Recommendations  None recommended by PT    Recommendations for Other Services       Precautions / Restrictions Precautions Precautions: Fall Precaution Comments: Orthostatic BP Restrictions Weight Bearing Restrictions: No    Mobility  Bed Mobility    General bed mobility comments: pt declines transfer to sitting EOB despite encouragement and education; pt able to scoot up in bed with BLE pushing into bed and min A from therapist  Transfers     Ambulation/Gait     Stairs             Wheelchair Mobility     Modified Rankin (Stroke Patients Only)       Balance       Sitting balance - Comments: pt declines sitting EOB         Cognition Arousal/Alertness: Awake/alert Behavior During Therapy: WFL for tasks assessed/performed;Flat affect Overall Cognitive Status: Within Functional Limits for tasks assessed      Exercises General Exercises - Lower Extremity Quad Sets: Strengthening;Supine;Both;15 reps    General Comments General comments (skin integrity, edema, etc.): Pt BP 119/35mmHg in supine, elevated HOB to full upright sitting after ~3 minutes BP 112/72mmHg; encouraged pt fully come to sitting EOB with therapist once good BP noted, but pt declines      Pertinent Vitals/Pain Pain Assessment: No/denies pain    Home Living                      Prior Function            PT Goals (current goals can now be found in the care plan section) Acute Rehab PT Goals Patient Stated Goal: "get CT scan" PT Goal Formulation: With patient Time For Goal Achievement: 09/25/19 Potential to Achieve Goals: Good Progress towards PT goals: Progressing toward goals    Frequency    Min 2X/week      PT Plan Current plan remains appropriate    Co-evaluation              AM-PAC PT "6 Clicks" Mobility   Outcome Measure  Help needed  turning from your back to your side while in a flat bed without using bedrails?: A Little Help needed moving from lying on your back to sitting on the side of a flat bed without using bedrails?: A Little Help needed moving to and from a bed to a chair (including a wheelchair)?: A Little Help needed standing up from a chair using your arms (e.g., wheelchair or bedside chair)?: A Little Help needed to walk in hospital room?: A Little Help needed climbing 3-5 steps with a railing? : A Lot 6 Click Score: 17    End of Session   Activity Tolerance: Other (comment) (Limited by BP) Patient left: in bed;with call bell/phone within reach;with bed  alarm set Nurse Communication: Mobility status;Other (comment) (BP) PT Visit Diagnosis: Unsteadiness on feet (R26.81);Other abnormalities of gait and mobility (R26.89)     Time: 1417-1440 PT Time Calculation (min) (ACUTE ONLY): 23 min  Charges:  $Therapeutic Exercise: 8-22 mins $Therapeutic Activity: 8-22 mins                      Tori Makenley Shimp PT, DPT 09/13/19, 3:17 PM

## 2019-09-14 LAB — CBC WITH DIFFERENTIAL/PLATELET
Abs Immature Granulocytes: 0.71 10*3/uL — ABNORMAL HIGH (ref 0.00–0.07)
Basophils Absolute: 0.1 10*3/uL (ref 0.0–0.1)
Basophils Relative: 0 %
Eosinophils Absolute: 0.2 10*3/uL (ref 0.0–0.5)
Eosinophils Relative: 1 %
HCT: 29.5 % — ABNORMAL LOW (ref 39.0–52.0)
Hemoglobin: 9.2 g/dL — ABNORMAL LOW (ref 13.0–17.0)
Immature Granulocytes: 4 %
Lymphocytes Relative: 8 %
Lymphs Abs: 1.4 10*3/uL (ref 0.7–4.0)
MCH: 28.8 pg (ref 26.0–34.0)
MCHC: 31.2 g/dL (ref 30.0–36.0)
MCV: 92.2 fL (ref 80.0–100.0)
Monocytes Absolute: 2.1 10*3/uL — ABNORMAL HIGH (ref 0.1–1.0)
Monocytes Relative: 12 %
Neutro Abs: 12.6 10*3/uL — ABNORMAL HIGH (ref 1.7–7.7)
Neutrophils Relative %: 75 %
Platelets: 178 10*3/uL (ref 150–400)
RBC: 3.2 MIL/uL — ABNORMAL LOW (ref 4.22–5.81)
RDW: 16 % — ABNORMAL HIGH (ref 11.5–15.5)
WBC: 17 10*3/uL — ABNORMAL HIGH (ref 4.0–10.5)
nRBC: 0 % (ref 0.0–0.2)

## 2019-09-14 LAB — COMPREHENSIVE METABOLIC PANEL
ALT: 10 U/L (ref 0–44)
AST: 18 U/L (ref 15–41)
Albumin: 2.7 g/dL — ABNORMAL LOW (ref 3.5–5.0)
Alkaline Phosphatase: 153 U/L — ABNORMAL HIGH (ref 38–126)
Anion gap: 9 (ref 5–15)
BUN: 16 mg/dL (ref 8–23)
CO2: 27 mmol/L (ref 22–32)
Calcium: 8.2 mg/dL — ABNORMAL LOW (ref 8.9–10.3)
Chloride: 101 mmol/L (ref 98–111)
Creatinine, Ser: 1.09 mg/dL (ref 0.61–1.24)
GFR calc Af Amer: >60 mL/min (ref >60)
GFR calc non Af Amer: >60 mL/min (ref >60)
Glucose, Bld: 123 mg/dL — ABNORMAL HIGH (ref 70–99)
Potassium: 3.4 mmol/L — ABNORMAL LOW (ref 3.5–5.1)
Sodium: 137 mmol/L (ref 135–145)
Total Bilirubin: 0.5 mg/dL (ref 0.3–1.2)
Total Protein: 5.7 g/dL — ABNORMAL LOW (ref 6.5–8.1)

## 2019-09-14 LAB — GLUCOSE, CAPILLARY
Glucose-Capillary: 124 mg/dL — ABNORMAL HIGH (ref 70–99)
Glucose-Capillary: 171 mg/dL — ABNORMAL HIGH (ref 70–99)
Glucose-Capillary: 181 mg/dL — ABNORMAL HIGH (ref 70–99)
Glucose-Capillary: 148 mg/dL — ABNORMAL HIGH (ref 70–99)

## 2019-09-14 LAB — PHOSPHORUS: Phosphorus: 2.3 mg/dL — ABNORMAL LOW (ref 2.5–4.6)

## 2019-09-14 LAB — MAGNESIUM: Magnesium: 1.6 mg/dL — ABNORMAL LOW (ref 1.7–2.4)

## 2019-09-14 MED ORDER — MAGNESIUM SULFATE 2 GM/50ML IV SOLN
2.0000 g | Freq: Once | INTRAVENOUS | Status: AC
  Administered 2019-09-14: 2 g via INTRAVENOUS
  Filled 2019-09-14: qty 50

## 2019-09-14 MED ORDER — COSYNTROPIN 0.25 MG IJ SOLR
0.2500 mg | Freq: Once | INTRAMUSCULAR | Status: AC
  Administered 2019-09-15: 0.25 mg via INTRAVENOUS
  Filled 2019-09-14: qty 0.25

## 2019-09-14 MED ORDER — POTASSIUM CHLORIDE CRYS ER 20 MEQ PO TBCR
40.0000 meq | EXTENDED_RELEASE_TABLET | Freq: Once | ORAL | Status: AC
  Administered 2019-09-14: 40 meq via ORAL
  Filled 2019-09-14: qty 2

## 2019-09-14 MED ORDER — SODIUM CHLORIDE 0.9 % IV BOLUS
1000.0000 mL | Freq: Once | INTRAVENOUS | Status: AC
  Administered 2019-09-14: 1000 mL via INTRAVENOUS

## 2019-09-14 MED ORDER — MIDODRINE HCL 5 MG PO TABS
5.0000 mg | ORAL_TABLET | Freq: Once | ORAL | Status: AC
  Administered 2019-09-14: 5 mg via ORAL
  Filled 2019-09-14: qty 1

## 2019-09-14 MED ORDER — MIDODRINE HCL 5 MG PO TABS
10.0000 mg | ORAL_TABLET | Freq: Three times a day (TID) | ORAL | Status: DC
  Administered 2019-09-15 – 2019-09-21 (×19): 10 mg via ORAL
  Filled 2019-09-14 (×21): qty 2

## 2019-09-14 NOTE — Plan of Care (Signed)
  Problem: Clinical Measurements: Goal: Will remain free from infection Outcome: Progressing   Problem: Nutrition: Goal: Adequate nutrition will be maintained Outcome: Progressing   

## 2019-09-14 NOTE — Progress Notes (Signed)
PROGRESS NOTE    [name]  NWG:[id number] DOB: Jul 30, 1950 DOA: 09/10/2019 PCP: Patient, No Pcp Per   Chief Complaint  Patient presents with  . Hypotension   Brief Narrative:  Tinsley Lomas. is Janavia Rottman 69 y.o. male with Azaleah Usman past medical history of colon cancer status post 7 cycles of chemotherapy with FOLFOX, stent placement to his sigmoid colon for stricture from the cancer, history of C. difficile colitis in March, history of coronary artery disease, history of diabetes mellitus type 2 who [facility] to undergo routine CT scan for his cancer staging.  While he was in the radiology department he felt weak and lightheaded.  His blood pressure was noted to be in the 60s.  He was promptly brought to the emergency department.  He was noted to have low blood pressure in the ED as well.  He was given IV fluids with improvement.  Blood work shows leukocytosis.  Upon questioning the patient he mentioned that for the past 1 to 2 weeks he has been having more diarrhea than usual.  He was diagnosed with C. difficile colitis back in March and was treated with oral vancomycin.  He denies any abdominal pain.  Denies any nausea vomiting.  No fever or chills.  He says he has had problems with his blood pressure and he is noted to be on midodrine.  He denies chest pain shortness of breath.  He has had about 5-6 bowel movements daily for the past few days.  Assessment & Plan:   Principal Problem:   Acute diarrhea Active Problems:   Cancer of left colon (HCC)   Hypotension  Assessment: This is Desteni Piscopo 69 year old Caucasian male who [facility] who has Darien Mignogna history of colon cancer who was noted to be hypotensive when he was in the radiology department.  He reports more diarrhea than usual over the last many days.  He has Nafis Farnan leukocytosis.  He has Rain Friedt previous history of C. difficile.  Concern is for recurrence of his C. difficile diarrhea.    Plan:  1. Acute  on chronic diarrhea in the setting of Millianna Szymborski history of C. difficile:  Continue vancomycin - may need taper with recurrence, will review timing of other infections Positive toxigenic C diff PCR (negative C diff toxin) Continue to follow diarrhea - seems to be improving  2.  Orthostatic Hypotension  Hypotension: Patient has Yisrael Obryan history of orthostatic hypotension.  He is noted to be on midodrine.  Patient's blood pressure was low in the radiology department with systolic in the 57Q. Suspected hypovolemia from diarrhea as well as chronic orthostatic hypotension. Follow orthostatics - still with significant orthostatic hypotension (BP to 60's with OT today) - thigh high ted hose, s/p IVF, continue midodrine (increase to 10 mg TID) and bolus Follow ACTH stim test  3.  Metastatic colon cancer: Under the care of Dr. [name].  He is receiving chemotherapy.  His last infusion was on 7/29.  It looks like he also got PEG filgrastim on that day.  This could be the reason for his leukocytosis.  He has stent in his sigmoid colon for his stricture. Follow CT scan abd/pelvis - with stable to improved diffuse hepatic metastatic disease - interval enlargement of low attenuation lesion in lower pancreatic head/uncinate process (consider MRI) - stable metallic stent in sigmoid colon (see report) Will need follow up outpatient  4.  Diabetes mellitus type 2: Monitor CBGs.  Not on insulin  at home.  HbA1c was 7.8 in June.  5. History of atrial flutter/atrial fibrillation: Not thought to be Layan Zalenski good candidate for anticoagulation per previous notes.   Continue digoxin (level 0.6) Holding metoprolol with hypotension on presentation, follow for resumption   6.  History of COPD: Stable  7.  Anemia of chronic disease: Monitor hemoglobin.  No evidence of overt bleeding.  8.  Chronic combined systolic and diastolic CHF: Most recent echocardiogram in June with preserved ejection fraction.  Currently euvolemic.  9. History  of coronary artery disease status post CABG: Stable  10. Wounds bilateral feet: States that he is followed by podiatry.  They were examined pta and were noted to be improving.  DVT prophylaxis: lovenox Code Status: full  Family Communication: none at bedside Disposition:   Status is: Inpatient  Remains inpatient appropriate because:Inpatient level of care appropriate due to severity of illness   Dispo: The patient is from: SNF              Anticipated d/c is to: SNF              Anticipated d/c date is: > 3 days              Patient currently is not medically stable to d/c.   Consultants:   oncology  Procedures: (  none  Antimicrobials:  Anti-infectives (From admission, onward)   Start     Dose/Rate Route Frequency Ordered Stop   09/10/19 2200  vancomycin (VANCOCIN) 50 mg/mL oral solution 125 mg     Discontinue     125 mg Oral 4 times daily 09/10/19 1739 09/20/19 2159     Subjective: No diarrhea Denies LH with sitting up  Objective: Vitals:   09/13/19 1218 09/13/19 2037 09/14/19 0647 09/14/19 1313  BP: 115/76 127/78 118/77 120/83  Pulse: 85 92 95 99  Resp: 20 18 18 14   Temp: 97.9 F (36.6 C) 98.7 F (37.1 C) 98.6 F (37 C) 98.2 F (36.8 C)  TempSrc: Oral Oral Oral Oral  SpO2: 98% 97%  100%  Weight:      Height:        Intake/Output Summary (Last 24 hours) at 09/14/2019 1901 Last data filed at 09/14/2019 1847 Gross per 24 hour  Intake 450 ml  Output 1400 ml  Net -950 ml   Filed Weights   09/10/19 0852 09/11/19 0232  Weight: 87 kg 80.7 kg    Examination:  General: No acute distress. Cardiovascular: Heart sounds show Sandford Diop regular rate, and rhythm. Lungs: Clear to auscultation bilaterally  Abdomen: Soft, nontender, nondistended  Neurological: Alert and oriented 3. Moves all extremities 4. Cranial nerves II through XII grossly intact. Skin: Warm and dry. No rashes or lesions. Extremities: No clubbing or cyanosis. No edema.    Data Reviewed: I  have personally reviewed following labs and imaging studies  CBC: Recent Labs  Lab 09/10/19 0922 09/11/19 0330 09/12/19 0413 09/13/19 0415 09/14/19 0431  WBC 22.4* 21.7* 17.0* 16.2* 17.0*  NEUTROABS 18.6*  --  12.8* 12.0* 12.6*  HGB 10.4* 9.5* 8.9* 9.0* 9.2*  HCT 33.1* 30.6* 28.1* 28.6* 29.5*  MCV 92.2 92.7 91.8 92.3 92.2  PLT 138* 136* 131* 155 195    Basic Metabolic Panel: Recent Labs  Lab 09/10/19 0922 09/11/19 0330 09/12/19 0413 09/13/19 0415 09/14/19 0431  NA 140 141 138 141 137  K 3.7 3.4* 3.5 3.7 3.4*  CL 100 104 102 103 101  CO2 24 27 27 27  27  GLUCOSE 137* 83 94 125* 123*  BUN 21 22 16 14 16   CREATININE 1.48* 1.19 1.09 1.03 1.09  CALCIUM 8.9 8.5* 8.1* 8.3* 8.2*  MG  --   --  1.6* 1.9 1.6*  PHOS  --   --  2.2* 2.1* 2.3*    GFR: Estimated Creatinine Clearance: 71 mL/min (by C-G formula based on SCr of 1.09 mg/dL).  Liver Function Tests: Recent Labs  Lab 09/11/19 0330 09/12/19 0413 09/13/19 0415 09/14/19 0431  AST 22 19 18 18   ALT 13 11 11 10   ALKPHOS 177* 156* 158* 153*  BILITOT 0.7 0.3 0.4 0.5  PROT 6.0* 5.4* 5.6* 5.7*  ALBUMIN 2.8* 2.6* 2.7* 2.7*    CBG: Recent Labs  Lab 09/13/19 1741 09/13/19 2040 09/14/19 0832 09/14/19 1128 09/14/19 1553  GLUCAP 140* 128* 124* 171* 181*     Recent Results (from the past 240 hour(s))  Blood culture (routine x 2)     Status: None (Preliminary result)   Collection Time: 09/10/19  1:52 PM   Specimen: BLOOD  Result Value Ref Range Status   Specimen Description   Final    BLOOD RIGHT ANTECUBITAL Performed at [facility], [place] [address] [place], [place] [address]    Special Requests   Final    BOTTLES DRAWN AEROBIC AND ANAEROBIC Blood Culture adequate volume Performed at [place] [address] [place], [place] [address]    Culture   Final    NO GROWTH 4 DAYS Performed at [facility], [place] [address], [place], [place] [address]    Report Status  PENDING  Incomplete  SARS Coronavirus 2 by RT PCR (hospital order, performed in [facility] hospital lab) Nasopharyngeal Nasopharyngeal Swab     Status: None   Collection Time: 09/10/19  3:33 PM   Specimen: Nasopharyngeal Swab  Result Value Ref Range Status   SARS Coronavirus 2 NEGATIVE NEGATIVE Final    Comment: (NOTE) SARS-CoV-2 target nucleic acids are NOT DETECTED.  The SARS-CoV-2 RNA is generally detectable in upper and lower respiratory specimens during the acute phase of infection. The lowest concentration of SARS-CoV-2 viral copies this assay can detect is 250 copies / mL. Lexis Potenza negative result does not preclude SARS-CoV-2 infection and should not be used as the sole basis for treatment or other patient management decisions.  Jovanny Stephanie negative result may occur with improper specimen collection / handling, submission of specimen other than nasopharyngeal swab, presence of viral mutation(s) within the areas targeted by this assay, and inadequate number of viral copies (<250 copies / mL). Elva Mauro negative result must be combined with clinical observations, patient history, and epidemiological information.  Fact Sheet for Patients:   StrictlyIdeas.no  Fact Sheet for Healthcare Providers: BankingDealers.co.za  This test is not yet approved or  cleared by the [place] FDA and has been authorized for detection and/or diagnosis of SARS-CoV-2 by FDA under an Emergency Use Authorization (EUA).  This EUA will remain in effect (meaning this test can be used) for the duration of the COVID-19 declaration under Section 564(b)(1) of the Act, 21 U.S.C. section 360bbb-3(b)(1), unless the authorization is terminated or revoked sooner.  Performed at [facility], [place] [address] [place], [place] [address]   Urine culture     Status: Abnormal   Collection Time: 09/10/19 10:39 PM   Specimen: Urine, Clean Catch  Result Value Ref Range  Status   Specimen Description   Final    URINE, CLEAN  CATCH Performed at [facility], [place] [address] [place], [place] [address]    Special Requests   Final    NONE Performed at [facility], [place] [address], [place], [place] [address]    Culture 30,000 COLONIES/mL YEAST (Osmond Steckman)  Final   Report Status 09/11/2019 FINAL  Final  C Difficile Quick Screen w PCR reflex     Status: Abnormal   Collection Time: 09/10/19 10:39 PM   Specimen: STOOL  Result Value Ref Range Status   C Diff antigen POSITIVE (Philip Eckersley) NEGATIVE Final   C Diff toxin NEGATIVE NEGATIVE Final   C Diff interpretation Results are indeterminate. See PCR results.  Final    Comment: Performed at [facility], [place] [address] [place], [place] [address]  Gastrointestinal Panel by PCR , Stool     Status: None   Collection Time: 09/10/19 10:39 PM   Specimen: STOOL  Result Value Ref Range Status   Campylobacter species NOT DETECTED NOT DETECTED Final   Plesimonas shigelloides NOT DETECTED NOT DETECTED Final   Salmonella species NOT DETECTED NOT DETECTED Final   Yersinia enterocolitica NOT DETECTED NOT DETECTED Final   Vibrio species NOT DETECTED NOT DETECTED Final   Vibrio cholerae NOT DETECTED NOT DETECTED Final   Enteroaggregative E coli (EAEC) NOT DETECTED NOT DETECTED Final   Enteropathogenic E coli (EPEC) NOT DETECTED NOT DETECTED Final   Enterotoxigenic E coli (ETEC) NOT DETECTED NOT DETECTED Final   Shiga like toxin producing E coli (STEC) NOT DETECTED NOT DETECTED Final   Shigella/Enteroinvasive E coli (EIEC) NOT DETECTED NOT DETECTED Final   Cryptosporidium NOT DETECTED NOT DETECTED Final   Cyclospora cayetanensis NOT DETECTED NOT DETECTED Final   Entamoeba histolytica NOT DETECTED NOT DETECTED Final   Giardia lamblia NOT DETECTED NOT DETECTED Final   Adenovirus F40/41 NOT DETECTED NOT DETECTED Final   Astrovirus NOT DETECTED NOT DETECTED Final   Norovirus GI/GII NOT  DETECTED NOT DETECTED Final   Rotavirus Jericho Cieslik NOT DETECTED NOT DETECTED Final   Sapovirus (I, II, IV, and V) NOT DETECTED NOT DETECTED Final    Comment: Performed at [facility], [address] [place], [place] [address]  C. Diff by PCR, Reflexed     Status: Abnormal   Collection Time: 09/10/19 10:39 PM  Result Value Ref Range Status   Toxigenic C. Difficile by PCR POSITIVE (Shaianne Nucci) NEGATIVE Final    Comment: Positive for toxigenic C. difficile with little to no toxin production. Only treat if clinical presentation suggests symptomatic illness. Performed at [facility], [place] [address] [place], [place] [address]   Blood culture (routine x 2)     Status: None (Preliminary result)   Collection Time: 09/11/19  4:10 AM   Specimen: BLOOD  Result Value Ref Range Status   Specimen Description   Final    BLOOD RIGHT ARM Performed at [place] [address] [place], [place] [address]    Special Requests   Final    BOTTLES DRAWN AEROBIC AND ANAEROBIC Blood Culture adequate volume Performed at [place] [address], [place], [place] [address]    Culture   Final    NO GROWTH 3 DAYS Performed at [facility], [place] [address] [place], [place] [address]    Report Status PENDING  Incomplete  MRSA PCR Screening     Status: None   Collection Time: 09/11/19  7:05 AM   Specimen: Nasal Mucosa; Nasopharyngeal  Result Value Ref Range Status   MRSA by PCR NEGATIVE NEGATIVE Final    Comment:        The GeneXpert MRSA Assay (FDA approved for NASAL specimens only), is one component of Saide Lanuza comprehensive MRSA colonization surveillance program. It is not intended to diagnose MRSA infection nor to guide or monitor treatment for MRSA infections. Performed at [facility], [place] [address] [place], [place] [address]          Radiology Studies: No results found.      Scheduled Meds: . Chlorhexidine Gluconate  Cloth  6 each Topical Daily  . digoxin  0.25 mg Oral Daily  . enoxaparin (LOVENOX) injection  40 mg Subcutaneous Q24H  . feeding supplement (KATE FARMS STANDARD 1.4)  325 mL Oral BID BM  . insulin aspart  0-15 Units Subcutaneous TID WC  . [START ON 09/15/2019] midodrine  10 mg Oral TID WC  . multivitamin with minerals  1 tablet Oral Daily  . mupirocin cream   Topical Daily  . sodium chloride flush  10-40 mL Intracatheter Q12H  . vancomycin  125 mg Oral QID   Continuous Infusions:   LOS: 4 days    Time spent: over 30 min    Fayrene Helper, MD [facility]   To contact the attending provider between 7A-7P or the covering provider during after hours 7P-7A, please log into the web site [email or web address] and access using universal [place] password for that web site. If you do not have the password, please call the hospital operator.  09/14/2019, 7:01 PM

## 2019-09-14 NOTE — Progress Notes (Signed)
Occupational Therapy Treatment Patient Details Name: Clarence Andrews. MRN: [id number] DOB: [birth year] Today's Date: 09/14/2019    History of present illness 69 y.o. 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 difficile colitis in March, CAD, diabetes mellitus type 2 who presented from a skilled nursing facility to undergo routine CT scan for his cancer staging and was found to be hypotensive with c/o weakness and lightheaded. Admitted with c.diff   OT comments  Treatment focused on improving patient's activity tolerance with functional mobility and orthostatic Bps monitored.  Today patient max assist for supine to sit and mod assist to return to supine. Patient's BP 123/ 58 in supine and dropped to 70/58 in sitting at side of bed. Patient asymptomatic and therapist had patient perform knee hikes x 20 reps and knee extension reps x 20 reps at side of bed to see if BP improves. BP 64/53 after 2 min of exercise. Patient returned to supine and BP improved to 111/76. Patient educated and encouraged to participate in bed exercises - upper and lower extremities - to maintain strength and mobility. Cont POC   Follow Up Recommendations  SNF    Equipment Recommendations  None recommended by OT    Recommendations for Other Services      Precautions / Restrictions Precautions Precautions: Fall Precaution Comments: Orthostatic BP Restrictions Weight Bearing Restrictions: No       Mobility Bed Mobility Overal bed mobility: Needs Assistance Bed Mobility: Supine to Sit;Sit to Supine     Supine to sit: Max assist;HOB elevated Sit to supine: Mod assist   General bed mobility comments: Patient max assist today - to assist with movement of LE's to side of bed and trunk lift off. Unable to get himself to edge of bed and needing therapist to pivot his hips and scoot him forward to edge of  bed.  Transfers                 General transfer comment: UNable to to attempt stand due to orthostatic BP.    Balance Overall balance assessment: Needs assistance Sitting-balance support: No upper extremity supported;Feet supported Sitting balance-Leahy Scale: Fair Sitting balance - Comments: Sits edge of bed without propping initially. Has mild loss of balance to the left today x 2.                                   ADL either performed or assessed with clinical judgement   ADL                       Lower Body Dressing: Total assistance Lower Body Dressing Details (indicate cue type and reason): Total to donn socks at side of bed.                     Vision Patient Visual Report: No change from baseline Vision Assessment?: No apparent visual deficits   Perception     Praxis      Cognition Arousal/Alertness: Awake/alert Behavior During Therapy: WFL for tasks assessed/performed;Flat affect Overall Cognitive Status: Within Functional Limits for tasks assessed  Exercises     Shoulder Instructions       General Comments      Pertinent Vitals/ Pain       Pain Assessment: No/denies pain  Home Living                                          Prior Functioning/Environment              Frequency  Min 2X/week        Progress Toward Goals  OT Goals(current goals can now be found in the care plan section)  Progress towards OT goals: OT to reassess next treatment (Activity limited by orthostatic BP)     Plan Discharge plan remains appropriate    Co-evaluation          OT goals addressed during session: Strengthening/ROM (activity tolerance, functional mobility)      AM-PAC OT "6 Clicks" Daily Activity     Outcome Measure   Help from another person eating meals?: None Help from another person taking care of personal grooming?: A  Little Help from another person toileting, which includes using toliet, bedpan, or urinal?: A Lot Help from another person bathing (including washing, rinsing, drying)?: A Little Help from another person to put on and taking off regular upper body clothing?: A Little Help from another person to put on and taking off regular lower body clothing?: A Lot 6 Click Score: 17    End of Session Equipment Utilized During Treatment: Gait belt  OT Visit Diagnosis: Muscle weakness (generalized) (M62.81);Pain;History of falling (Z91.81)   Activity Tolerance Other (comment) (limited by BP)   Patient Left     Nurse Communication  (BP)        Time: 4008-6761 OT Time Calculation (min): 17 min  Charges: OT General Charges $OT Visit: 1 Visit OT Treatments $Therapeutic Activity: 8-22 mins  Daya Dutt, OTR/L [place]  Office [phone] Pager: [place] 09/14/2019, 3:52 PM

## 2019-09-15 LAB — CBC WITH DIFFERENTIAL/PLATELET
Abs Immature Granulocytes: 0.56 10*3/uL — ABNORMAL HIGH (ref 0.00–0.07)
Basophils Absolute: 0.1 10*3/uL (ref 0.0–0.1)
Basophils Relative: 0 %
Eosinophils Absolute: 0.2 10*3/uL (ref 0.0–0.5)
Eosinophils Relative: 1 %
HCT: 27.8 % — ABNORMAL LOW (ref 39.0–52.0)
Hemoglobin: 8.7 g/dL — ABNORMAL LOW (ref 13.0–17.0)
Immature Granulocytes: 4 %
Lymphocytes Relative: 10 %
Lymphs Abs: 1.5 10*3/uL (ref 0.7–4.0)
MCH: 29 pg (ref 26.0–34.0)
MCHC: 31.3 g/dL (ref 30.0–36.0)
MCV: 92.7 fL (ref 80.0–100.0)
Monocytes Absolute: 2.3 10*3/uL — ABNORMAL HIGH (ref 0.1–1.0)
Monocytes Relative: 15 %
Neutro Abs: 10.8 10*3/uL — ABNORMAL HIGH (ref 1.7–7.7)
Neutrophils Relative %: 70 %
Platelets: 203 10*3/uL (ref 150–400)
RBC: 3 MIL/uL — ABNORMAL LOW (ref 4.22–5.81)
RDW: 16.5 % — ABNORMAL HIGH (ref 11.5–15.5)
WBC: 15.3 10*3/uL — ABNORMAL HIGH (ref 4.0–10.5)
nRBC: 0.1 % (ref 0.0–0.2)

## 2019-09-15 LAB — CULTURE, BLOOD (ROUTINE X 2)
Culture: NO GROWTH
Special Requests: ADEQUATE

## 2019-09-15 LAB — GLUCOSE, CAPILLARY
Glucose-Capillary: 149 mg/dL — ABNORMAL HIGH (ref 70–99)
Glucose-Capillary: 220 mg/dL — ABNORMAL HIGH (ref 70–99)
Glucose-Capillary: 217 mg/dL — ABNORMAL HIGH (ref 70–99)
Glucose-Capillary: 171 mg/dL — ABNORMAL HIGH (ref 70–99)

## 2019-09-15 LAB — ACTH STIMULATION, 3 TIME POINTS
Cortisol, 30 Min: 13.6 ug/dL
Cortisol, 60 Min: 14.5 ug/dL
Cortisol, Base: 14 ug/dL

## 2019-09-15 LAB — COMPREHENSIVE METABOLIC PANEL
ALT: 10 U/L (ref 0–44)
AST: 17 U/L (ref 15–41)
Albumin: 2.5 g/dL — ABNORMAL LOW (ref 3.5–5.0)
Alkaline Phosphatase: 143 U/L — ABNORMAL HIGH (ref 38–126)
Anion gap: 10 (ref 5–15)
BUN: 18 mg/dL (ref 8–23)
CO2: 26 mmol/L (ref 22–32)
Calcium: 8.1 mg/dL — ABNORMAL LOW (ref 8.9–10.3)
Chloride: 103 mmol/L (ref 98–111)
Creatinine, Ser: 1.12 mg/dL (ref 0.61–1.24)
GFR calc Af Amer: >60 mL/min (ref >60)
GFR calc non Af Amer: >60 mL/min (ref >60)
Glucose, Bld: 174 mg/dL — ABNORMAL HIGH (ref 70–99)
Potassium: 3.7 mmol/L (ref 3.5–5.1)
Sodium: 139 mmol/L (ref 135–145)
Total Bilirubin: 0.5 mg/dL (ref 0.3–1.2)
Total Protein: 5.4 g/dL — ABNORMAL LOW (ref 6.5–8.1)

## 2019-09-15 LAB — MAGNESIUM: Magnesium: 1.8 mg/dL (ref 1.7–2.4)

## 2019-09-15 LAB — PHOSPHORUS: Phosphorus: 2.2 mg/dL — ABNORMAL LOW (ref 2.5–4.6)

## 2019-09-15 MED ORDER — SIMETHICONE 80 MG PO CHEW
80.0000 mg | CHEWABLE_TABLET | Freq: Once | ORAL | Status: AC
  Administered 2019-09-15: 80 mg via ORAL
  Filled 2019-09-15: qty 1

## 2019-09-15 MED ORDER — COSYNTROPIN 0.25 MG IJ SOLR
0.2500 mg | Freq: Once | INTRAMUSCULAR | Status: AC
  Administered 2019-09-16: 0.25 mg via INTRAVENOUS
  Filled 2019-09-15: qty 0.25

## 2019-09-15 NOTE — Progress Notes (Signed)
Care assumed from previous RN. Agree with assessment. Pt in bed, VSS, NAD. Will continue to assess and monitor pt. 

## 2019-09-15 NOTE — Progress Notes (Signed)
PROGRESS NOTE    [name]  GYJ:[id number] DOB: 1950-07-22 DOA: 09/10/2019 PCP: Patient, No Pcp Per   Chief Complaint  Patient presents with   Hypotension   Brief Narrative:  Nahome Bublitz. is Quinnton Bury 69 y.o. male with Nino Amano past medical history of colon cancer status post 7 cycles of chemotherapy with FOLFOX, stent placement to his sigmoid colon for stricture from the cancer, history of C. difficile colitis in March, history of coronary artery disease, history of diabetes mellitus type 2 who [facility] to undergo routine CT scan for his cancer staging.  While he was in the radiology department he felt weak and lightheaded.  His blood pressure was noted to be in the 60s.  He was promptly brought to the emergency department.  He was noted to have low blood pressure in the ED as well.  He was given IV fluids with improvement.  Blood work shows leukocytosis.  Upon questioning the patient he mentioned that for the past 1 to 2 weeks he has been having more diarrhea than usual.  He was diagnosed with C. difficile colitis back in March and was treated with oral vancomycin.  He denies any abdominal pain.  Denies any nausea vomiting.  No fever or chills.  He says he has had problems with his blood pressure and he is noted to be on midodrine.  He denies chest pain shortness of breath.  He has had about 5-6 bowel movements daily for the past few days.  Assessment & Plan:   Principal Problem:   Acute diarrhea Active Problems:   Cancer of left colon (HCC)   Hypotension  Assessment: This is Choya Tornow 69 year old Caucasian male who lives [facility] who has Sashay Felling history of colon cancer who was noted to be hypotensive when he was in the radiology department.  He reports more diarrhea than usual over the last many days.  He has Caine Barfield leukocytosis.  He has Donyea Gafford previous history of C. difficile.  Concern is for recurrence of his C. difficile diarrhea.    Plan:  1. Acute  on chronic diarrhea in the setting of Jaysion Ramseyer history of C. difficile:  Continue vancomycin - may need taper with recurrence, will review timing of other infections Positive toxigenic C diff PCR (negative C diff toxin) Continue to follow diarrhea - seems to be improving  2.  Orthostatic Hypotension   Hypotension: Patient has Carsen Machi history of orthostatic hypotension.  He is noted to be on midodrine.  Patient's blood pressure was low in the radiology department with systolic in the 26V. Suspected hypovolemia from diarrhea as well as chronic orthostatic hypotension. Follow orthostatics - still with significant orthostatic hypotension (BP to 60's with OT today) - thigh high ted hose, s/p IVF, continue midodrine (increase to 10 mg TID) and bolus Follow ACTH stim test - not clear this was done correctly, will repeat  3.  Metastatic colon cancer: Under the care of Dr. [name].  He is receiving chemotherapy.  His last infusion was on 7/29.  It looks like he also got PEG filgrastim on that day.  This could be the reason for his leukocytosis.  He has stent in his sigmoid colon for his stricture. Follow CT scan abd/pelvis - with stable to improved diffuse hepatic metastatic disease - interval enlargement of low attenuation lesion in lower pancreatic head/uncinate process (consider MRI) - stable metallic stent in sigmoid colon (see report) Will need follow up outpatient  4.  Diabetes mellitus type 2: Monitor CBGs.  Not on insulin at home.  HbA1c was 7.8 in June.  5. History of atrial flutter/atrial fibrillation: Not thought to be Donovan Persley good candidate for anticoagulation per previous notes.   Continue digoxin (level 0.6) Holding metoprolol with hypotension on presentation, follow for resumption   6.  History of COPD: Stable  7.  Anemia of chronic disease: Monitor hemoglobin.  No evidence of overt bleeding.  8.  Chronic combined systolic and diastolic CHF: Most recent echocardiogram in June with preserved ejection  fraction.  Currently euvolemic.  9. History of coronary artery disease status post CABG: Stable  10. Wounds bilateral feet: States that he is followed by podiatry.  They were examined pta and were noted to be improving.  DVT prophylaxis: lovenox Code Status: full  Family Communication: none at bedside Disposition:   Status is: Inpatient  Remains inpatient appropriate because:Inpatient level of care appropriate due to severity of illness   Dispo: The patient is from: SNF              Anticipated d/c is to: SNF              Anticipated d/c date is: > 3 days              Patient currently is not medically stable to d/c.   Consultants:   oncology  Procedures: (  none  Antimicrobials:  Anti-infectives (From admission, onward)   Start     Dose/Rate Route Frequency Ordered Stop   09/10/19 2200  vancomycin (VANCOCIN) 50 mg/mL oral solution 125 mg     Discontinue     125 mg Oral 4 times daily 09/10/19 1739 09/20/19 2159     Subjective: No new complaints  Objective: Vitals:   09/14/19 2034 09/15/19 0558 09/15/19 0600 09/15/19 1158  BP: 132/81 (!) 154/96 122/80 119/72  Pulse: 90 94 87 92  Resp:  18  20  Temp: 98.6 F (37 C) 98.3 F (36.8 C)  98 F (36.7 C)  TempSrc: Oral Oral  Oral  SpO2: 100% 99%  99%  Weight:      Height:        Intake/Output Summary (Last 24 hours) at 09/15/2019 1422 Last data filed at 09/15/2019 1300 Gross per 24 hour  Intake 930 ml  Output 1000 ml  Net -70 ml   Filed Weights   09/10/19 0852 09/11/19 0232  Weight: 87 kg 80.7 kg    Examination:  General: No acute distress. Cardiovascular: Heart sounds show Keeon Zurn regular rate, and rhythm. N Lungs: Clear to auscultation bilaterally Abdomen: Soft, nontender, nondistended  Neurological: Alert and oriented 3. Moves all extremities 4 . Cranial nerves II through XII grossly intact. Skin: Warm and dry. No rashes or lesions. Extremities: No clubbing or cyanosis. No edema.   Data Reviewed: I  have personally reviewed following labs and imaging studies  CBC: Recent Labs  Lab 09/10/19 0922 09/10/19 0922 09/11/19 0330 09/12/19 0413 09/13/19 0415 09/14/19 0431 09/15/19 0400  WBC 22.4*   < > 21.7* 17.0* 16.2* 17.0* 15.3*  NEUTROABS 18.6*  --   --  12.8* 12.0* 12.6* 10.8*  HGB 10.4*   < > 9.5* 8.9* 9.0* 9.2* 8.7*  HCT 33.1*   < > 30.6* 28.1* 28.6* 29.5* 27.8*  MCV 92.2   < > 92.7 91.8 92.3 92.2 92.7  PLT 138*   < > 136* 131* 155 178 203   < > = values in this interval not displayed.  Basic Metabolic Panel: Recent Labs  Lab 09/11/19 0330 09/12/19 0413 09/13/19 0415 09/14/19 0431 09/15/19 0400  NA 141 138 141 137 139  K 3.4* 3.5 3.7 3.4* 3.7  CL 104 102 103 101 103  CO2 27 27 27 27 26   GLUCOSE 83 94 125* 123* 174*  BUN 22 16 14 16 18   CREATININE 1.19 1.09 1.03 1.09 1.12  CALCIUM 8.5* 8.1* 8.3* 8.2* 8.1*  MG  --  1.6* 1.9 1.6* 1.8  PHOS  --  2.2* 2.1* 2.3* 2.2*    GFR: Estimated Creatinine Clearance: 69.1 mL/min (by C-G formula based on SCr of 1.12 mg/dL).  Liver Function Tests: Recent Labs  Lab 09/11/19 0330 09/12/19 0413 09/13/19 0415 09/14/19 0431 09/15/19 0400  AST 22 19 18 18 17   ALT 13 11 11 10 10   ALKPHOS 177* 156* 158* 153* 143*  BILITOT 0.7 0.3 0.4 0.5 0.5  PROT 6.0* 5.4* 5.6* 5.7* 5.4*  ALBUMIN 2.8* 2.6* 2.7* 2.7* 2.5*    CBG: Recent Labs  Lab 09/14/19 1128 09/14/19 1553 09/14/19 2045 09/15/19 0803 09/15/19 1155  GLUCAP 171* 181* 148* 149* 220*     Recent Results (from the past 240 hour(s))  Blood culture (routine x 2)     Status: None   Collection Time: 09/10/19  1:52 PM   Specimen: BLOOD  Result Value Ref Range Status   Specimen Description   Final    BLOOD RIGHT ANTECUBITAL Performed at [facility], [place] [address] [place], [place] [address]    Special Requests   Final    BOTTLES DRAWN AEROBIC AND ANAEROBIC Blood Culture adequate volume Performed at [place] [address], [place], [place] [address]    Culture   Final    NO GROWTH 5 DAYS Performed at [facility], [place] [address], [place], [place] [address]    Report Status 09/15/2019 FINAL  Final  SARS Coronavirus 2 by RT PCR (hospital order, performed in [facility] hospital lab) Nasopharyngeal Nasopharyngeal Swab     Status: None   Collection Time: 09/10/19  3:33 PM   Specimen: Nasopharyngeal Swab  Result Value Ref Range Status   SARS Coronavirus 2 NEGATIVE NEGATIVE Final    Comment: (NOTE) SARS-CoV-2 target nucleic acids are NOT DETECTED.  The SARS-CoV-2 RNA is generally detectable in upper and lower respiratory specimens during the acute phase of infection. The lowest concentration of SARS-CoV-2 viral copies this assay can detect is 250 copies / mL. Robson Trickey negative result does not preclude SARS-CoV-2 infection and should not be used as the sole basis for treatment or other patient management decisions.  Susann Lawhorne negative result may occur with improper specimen collection / handling, submission of specimen other than nasopharyngeal swab, presence of viral mutation(s) within the areas targeted by this assay, and inadequate number of viral copies (<250 copies / mL). Ravynn Hogate negative result must be combined with clinical observations, patient history, and epidemiological information.  Fact Sheet for Patients:   StrictlyIdeas.no  Fact Sheet for Healthcare Providers: BankingDealers.co.za  This test is not yet approved or  cleared by the [place] FDA and has been authorized for detection and/or diagnosis of SARS-CoV-2 by FDA under an Emergency Use Authorization (EUA).  This EUA will remain in effect (meaning this test can be used) for the duration of the COVID-19 declaration under Section 564(b)(1) of the Act, 21 U.S.C. section 360bbb-3(b)(1), unless the authorization is terminated or revoked sooner.  Performed at [facility],  [place]  [address], [place], [place] [address]   Urine culture     Status: Abnormal   Collection Time: 09/10/19 10:39 PM   Specimen: Urine, Clean Catch  Result Value Ref Range Status   Specimen Description   Final    URINE, CLEAN CATCH Performed at [facility], [place] [address], [place], [place] [address]    Special Requests   Final    NONE Performed at [facility], [place] [address], [place], [place] [address]    Culture 30,000 COLONIES/mL YEAST (Ameet Sandy)  Final   Report Status 09/11/2019 FINAL  Final  C Difficile Quick Screen w PCR reflex     Status: Abnormal   Collection Time: 09/10/19 10:39 PM   Specimen: STOOL  Result Value Ref Range Status   C Diff antigen POSITIVE (Alli Jasmer) NEGATIVE Final   C Diff toxin NEGATIVE NEGATIVE Final   C Diff interpretation Results are indeterminate. See PCR results.  Final    Comment: Performed at [facility], [place] [address] [place], [place] [address]  Gastrointestinal Panel by PCR , Stool     Status: None   Collection Time: 09/10/19 10:39 PM   Specimen: STOOL  Result Value Ref Range Status   Campylobacter species NOT DETECTED NOT DETECTED Final   Plesimonas shigelloides NOT DETECTED NOT DETECTED Final   Salmonella species NOT DETECTED NOT DETECTED Final   Yersinia enterocolitica NOT DETECTED NOT DETECTED Final   Vibrio species NOT DETECTED NOT DETECTED Final   Vibrio cholerae NOT DETECTED NOT DETECTED Final   Enteroaggregative E coli (EAEC) NOT DETECTED NOT DETECTED Final   Enteropathogenic E coli (EPEC) NOT DETECTED NOT DETECTED Final   Enterotoxigenic E coli (ETEC) NOT DETECTED NOT DETECTED Final   Shiga like toxin producing E coli (STEC) NOT DETECTED NOT DETECTED Final   Shigella/Enteroinvasive E coli (EIEC) NOT DETECTED NOT DETECTED Final   Cryptosporidium NOT DETECTED NOT DETECTED Final   Cyclospora cayetanensis NOT DETECTED NOT DETECTED Final   Entamoeba histolytica NOT DETECTED NOT DETECTED Final    Giardia lamblia NOT DETECTED NOT DETECTED Final   Adenovirus F40/41 NOT DETECTED NOT DETECTED Final   Astrovirus NOT DETECTED NOT DETECTED Final   Norovirus GI/GII NOT DETECTED NOT DETECTED Final   Rotavirus Daxton Nydam NOT DETECTED NOT DETECTED Final   Sapovirus (I, II, IV, and V) NOT DETECTED NOT DETECTED Final    Comment: Performed at [facility], [place] [place], [place] [address]  C. Diff by PCR, Reflexed     Status: Abnormal   Collection Time: 09/10/19 10:39 PM  Result Value Ref Range Status   Toxigenic C. Difficile by PCR POSITIVE (Ahnya Akre) NEGATIVE Final    Comment: Positive for toxigenic C. difficile with little to no toxin production. Only treat if clinical presentation suggests symptomatic illness. Performed at [facility], [place] [address] [place], [place] [address]   Blood culture (routine x 2)     Status: None (Preliminary result)   Collection Time: 09/11/19  4:10 AM   Specimen: BLOOD  Result Value Ref Range Status   Specimen Description   Final    BLOOD RIGHT ARM Performed at [place] [address], [place], [place] [address]    Special Requests   Final    BOTTLES DRAWN AEROBIC AND ANAEROBIC Blood Culture adequate volume Performed at [place] [address], [place], [place] [address]    Culture   Final  NO GROWTH 4 DAYS Performed at [facility], [place] [address], [place], [place] [address]    Report Status PENDING  Incomplete  MRSA PCR Screening     Status: None   Collection Time: 09/11/19  7:05 AM   Specimen: Nasal Mucosa; Nasopharyngeal  Result Value Ref Range Status   MRSA by PCR NEGATIVE NEGATIVE Final    Comment:        The GeneXpert MRSA Assay (FDA approved for NASAL specimens only), is one component of Loyal Rudy comprehensive MRSA colonization surveillance program. It is not intended to diagnose MRSA infection nor to guide or monitor treatment for MRSA infections. Performed at [facility], [place] [address], [place], [place] [address]          Radiology Studies: No results found.      Scheduled Meds:  Chlorhexidine Gluconate Cloth  6 each Topical Daily   digoxin  0.25 mg Oral Daily   enoxaparin (LOVENOX) injection  40 mg Subcutaneous Q24H   feeding supplement (KATE FARMS STANDARD 1.4)  325 mL Oral BID BM   insulin aspart  0-15 Units Subcutaneous TID WC   midodrine  10 mg Oral TID WC   multivitamin with minerals  1 tablet Oral Daily   mupirocin cream   Topical Daily   sodium chloride flush  10-40 mL Intracatheter Q12H   vancomycin  125 mg Oral QID   Continuous Infusions:   LOS: 5 days    Time spent: over 30 min    Fayrene Helper, MD [facility]   To contact the attending provider between 7A-7P or the covering provider during after hours 7P-7A, please log into the web site [email or web address] and access using universal [place] password for that web site. If you do not have the password, please call the hospital operator.  09/15/2019, 2:22 PM

## 2019-09-16 LAB — CBC WITH DIFFERENTIAL/PLATELET
Abs Immature Granulocytes: 0.53 10*3/uL — ABNORMAL HIGH (ref 0.00–0.07)
Basophils Absolute: 0.1 10*3/uL (ref 0.0–0.1)
Basophils Relative: 0 %
Eosinophils Absolute: 0.2 10*3/uL (ref 0.0–0.5)
Eosinophils Relative: 2 %
HCT: 28.2 % — ABNORMAL LOW (ref 39.0–52.0)
Hemoglobin: 9 g/dL — ABNORMAL LOW (ref 13.0–17.0)
Immature Granulocytes: 3 %
Lymphocytes Relative: 10 %
Lymphs Abs: 1.5 10*3/uL (ref 0.7–4.0)
MCH: 29.2 pg (ref 26.0–34.0)
MCHC: 31.9 g/dL (ref 30.0–36.0)
MCV: 91.6 fL (ref 80.0–100.0)
Monocytes Absolute: 2.1 10*3/uL — ABNORMAL HIGH (ref 0.1–1.0)
Monocytes Relative: 14 %
Neutro Abs: 11.3 10*3/uL — ABNORMAL HIGH (ref 1.7–7.7)
Neutrophils Relative %: 71 %
Platelets: 204 10*3/uL (ref 150–400)
RBC: 3.08 MIL/uL — ABNORMAL LOW (ref 4.22–5.81)
RDW: 16.2 % — ABNORMAL HIGH (ref 11.5–15.5)
WBC: 15.8 10*3/uL — ABNORMAL HIGH (ref 4.0–10.5)
nRBC: 0.2 % (ref 0.0–0.2)

## 2019-09-16 LAB — GLUCOSE, CAPILLARY
Glucose-Capillary: 120 mg/dL — ABNORMAL HIGH (ref 70–99)
Glucose-Capillary: 269 mg/dL — ABNORMAL HIGH (ref 70–99)
Glucose-Capillary: 287 mg/dL — ABNORMAL HIGH (ref 70–99)

## 2019-09-16 LAB — ACTH STIMULATION, 3 TIME POINTS
Cortisol, 30 Min: 27.7 ug/dL
Cortisol, 60 Min: 32.6 ug/dL
Cortisol, Base: 10.8 ug/dL

## 2019-09-16 LAB — COMPREHENSIVE METABOLIC PANEL
ALT: 10 U/L (ref 0–44)
AST: 16 U/L (ref 15–41)
Albumin: 2.5 g/dL — ABNORMAL LOW (ref 3.5–5.0)
Alkaline Phosphatase: 132 U/L — ABNORMAL HIGH (ref 38–126)
Anion gap: 11 (ref 5–15)
BUN: 16 mg/dL (ref 8–23)
CO2: 25 mmol/L (ref 22–32)
Calcium: 8.4 mg/dL — ABNORMAL LOW (ref 8.9–10.3)
Chloride: 103 mmol/L (ref 98–111)
Creatinine, Ser: 0.92 mg/dL (ref 0.61–1.24)
GFR calc Af Amer: >60 mL/min (ref >60)
GFR calc non Af Amer: >60 mL/min (ref >60)
Glucose, Bld: 96 mg/dL (ref 70–99)
Potassium: 3.5 mmol/L (ref 3.5–5.1)
Sodium: 139 mmol/L (ref 135–145)
Total Bilirubin: 0.4 mg/dL (ref 0.3–1.2)
Total Protein: 5.5 g/dL — ABNORMAL LOW (ref 6.5–8.1)

## 2019-09-16 LAB — CULTURE, BLOOD (ROUTINE X 2)
Culture: NO GROWTH
Special Requests: ADEQUATE

## 2019-09-16 LAB — PHOSPHORUS: Phosphorus: 2.3 mg/dL — ABNORMAL LOW (ref 2.5–4.6)

## 2019-09-16 LAB — MAGNESIUM: Magnesium: 1.6 mg/dL — ABNORMAL LOW (ref 1.7–2.4)

## 2019-09-16 MED ORDER — MAGNESIUM SULFATE 2 GM/50ML IV SOLN
2.0000 g | Freq: Once | INTRAVENOUS | Status: AC
  Administered 2019-09-16: 2 g via INTRAVENOUS
  Filled 2019-09-16: qty 50

## 2019-09-16 MED ORDER — LACTATED RINGERS IV BOLUS
500.0000 mL | Freq: Once | INTRAVENOUS | Status: AC
  Administered 2019-09-16: 500 mL via INTRAVENOUS

## 2019-09-16 MED ORDER — FLUCONAZOLE 100 MG PO TABS
200.0000 mg | ORAL_TABLET | Freq: Every day | ORAL | Status: DC
  Administered 2019-09-16 – 2019-09-21 (×6): 200 mg via ORAL
  Filled 2019-09-16 (×6): qty 2

## 2019-09-16 NOTE — Progress Notes (Signed)
Pt RED MEWS... pt stable and asymptomatic orthostatic VSs obtained per MD request as a follow up from previous orthostatic VS a few days ago. Pt currently being treated, MD made aware of current findings. SRP, RN

## 2019-09-16 NOTE — TOC Progression Note (Signed)
Transition of Care (TOC) - Progression Note    Patient Details  Name: Clarence Andrews. MRN: [id number] Date of Birth: December 08, 1950  Transition of Care [facility]) CM/SW Contact  Ross Ludwig, [place] Phone Number: 09/16/2019, 5:15 PM  Clinical Narrative:     CSW was informed that patient has been approved for SNF placement, and authorization is good through tomorrow.  CSW updated physician.   Expected Discharge Plan: [place] Barriers to Discharge: Continued Medical Work up, Ship broker  Expected Discharge Plan and Services Expected Discharge Plan: [place] Choice: Nursing Home Living arrangements for the past 2 months: [place]                                       Social Determinants of Health (SDOH) Interventions    Readmission Risk Interventions No flowsheet data found.

## 2019-09-16 NOTE — Progress Notes (Signed)
Physical Therapy Treatment Patient Details Name: Clarence Andrews. MRN: [id number] DOB: [birth year] Today's Date: 09/16/2019    History of Present Illness 69 y.o. 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 difficile colitis in March, CAD, diabetes mellitus type 2 who presented from a skilled nursing facility to undergo routine CT scan for his cancer staging and was found to be hypotensive with c/o weakness and lightheaded. Admitted with c.diff    PT Comments    Pt in bed.  General Comments: AxO x 3 loves to tell stories, familiar from prior admit June General bed mobility comments: required + 2 assist side to side rolling for peri care after a BM then + 2 assist to tranasition to EOB using bed pad to complete scooting.  Sat EOB x 5 min before attempting sit to stand due to low BP. General transfer comment: + 2 side by side assist for safety to assist with standing to take BP pt only tolerated <60 sec due to fatigue, weakness Supine        BP 122/70 HR 84, RA 100% EOB            BP 74/61, HR 98, RA 100% EOB 5 min  BP 118/80, HR 100, RA 100% Standing      BP 86/51, HR 105, RA 100%  Follow Up Recommendations  SNF     Equipment Recommendations  None recommended by PT    Recommendations for Other Services       Precautions / Restrictions Precautions Precautions: Fall Precaution Comments: Orthostatic BP    Mobility  Bed Mobility Overal bed mobility: Needs Assistance Bed Mobility: Rolling;Supine to Sit;Sit to Supine Rolling: Mod assist;+2 for physical assistance   Supine to sit: Max assist;HOB elevated Sit to supine: Max assist;Total assist   General bed mobility comments: required + 2 assist side to side rolling for peri care after a BM then + 2 assist to tranasition to EOB using bed pad to complete scooting.  Sat EOB x 5 min before attempting sit to stand due to low  BP.  Transfers Overall transfer level: Needs assistance Equipment used: Rolling walker (2 wheeled) Transfers: Sit to/from Stand Sit to Stand: Min assist;+2 safety/equipment         General transfer comment: + 2 side by side assist for safety to assist with standing to take BP pt only tolerated <60 sec due to fatigue, weakness  Ambulation/Gait                 Stairs             Wheelchair Mobility    Modified Rankin (Stroke Patients Only)       Balance                                            Cognition Arousal/Alertness: Awake/alert Behavior During Therapy: WFL for tasks assessed/performed;Flat affect Overall Cognitive Status: Within Functional Limits for tasks assessed                                 General Comments: AxO x 3 loves to tell stories, familiar from prior admit June      Exercises      General Comments  Pertinent Vitals/Pain Pain Assessment: No/denies pain    Home Living                      Prior Function            PT Goals (current goals can now be found in the care plan section) Progress towards PT goals: Progressing toward goals    Frequency    Min 2X/week      PT Plan Current plan remains appropriate    Co-evaluation              AM-PAC PT "6 Clicks" Mobility   Outcome Measure  Help needed turning from your back to your side while in a flat bed without using bedrails?: A Lot Help needed moving from lying on your back to sitting on the side of a flat bed without using bedrails?: A Lot Help needed moving to and from a bed to a chair (including a wheelchair)?: A Lot Help needed standing up from a chair using your arms (e.g., wheelchair or bedside chair)?: A Lot Help needed to walk in hospital room?: A Lot Help needed climbing 3-5 steps with a railing? : Total 6 Click Score: 11    End of Session Equipment Utilized During Treatment: Gait belt Activity  Tolerance: Patient limited by fatigue Patient left: in bed;with call bell/phone within reach;with bed alarm set Nurse Communication: Mobility status PT Visit Diagnosis: Unsteadiness on feet (R26.81);Other abnormalities of gait and mobility (R26.89)     Time: 5625-6389 PT Time Calculation (min) (ACUTE ONLY): 26 min  Charges:  $Therapeutic Activity: 23-37 mins                     Rica Koyanagi  PTA Acute  Rehabilitation Services Pager      [phone] Office      [phone]

## 2019-09-16 NOTE — Care Management Important Message (Signed)
Important Message  Patient Details IM Letter given to the Patient Name: Clarence Andrews. MRN: [id number] Date of Birth: 1950-03-05   Medicare Important Message Given:  Yes     Kerin Salen 09/16/2019, 10:34 AM

## 2019-09-16 NOTE — Progress Notes (Signed)
PROGRESS NOTE    [name]  WNI:[id number] DOB: [birth year] DOA: 09/10/2019 PCP: Patient, No Pcp Per   Chief Complaint  Patient presents with  . Hypotension   Brief Narrative:  Gabrien Mentink. is Dayna Geurts 69 y.o. male with Elonda Giuliano past medical history of colon cancer status post 7 cycles of chemotherapy with FOLFOX, stent placement to his sigmoid colon for stricture from the cancer, history of C. difficile colitis in March, history of coronary artery disease, history of diabetes mellitus type 2 who [facility] to undergo routine CT scan for his cancer staging.  While he was in the radiology department he felt weak and lightheaded.  His blood pressure was noted to be in the 60s.  He was promptly brought to the emergency department.  He was noted to have low blood pressure in the ED as well.  He was given IV fluids with improvement.  Blood work shows leukocytosis.  Upon questioning the patient he mentioned that for the past 1 to 2 weeks he has been having more diarrhea than usual.  He was diagnosed with C. difficile colitis back in March and was treated with oral vancomycin.  He denies any abdominal pain.  Denies any nausea vomiting.  No fever or chills.  He says he has had problems with his blood pressure and he is noted to be on midodrine.  He denies chest pain shortness of breath.  He has had about 5-6 bowel movements daily for the past few days.  Assessment & Plan:   Principal Problem:   Acute diarrhea Active Problems:   Cancer of left colon (HCC)   Hypotension  Assessment: This is Aayra Hornbaker 69 year old Caucasian male who [facility] who has Keshonda Monsour history of colon cancer who was noted to be hypotensive when he was in the radiology department.  He reports more diarrhea than usual over the last many days.  He has Nichlos Kunzler leukocytosis.  He has Hadas Jessop previous history of C. difficile.  Concern is for recurrence of his C. difficile diarrhea.    Plan:  1. Acute  on chronic diarrhea in the setting of Mayzee Reichenbach history of C. difficile:  Continue vancomycin - may need taper with recurrence, will review timing of other infections Positive toxigenic C diff PCR (negative C diff toxin) Continue to follow diarrhea - seems to be improving  2.  Orthostatic Hypotension  Hypotension: Patient has Eleny Cortez history of orthostatic hypotension.  He is noted to be on midodrine.  Patient's blood pressure was low in the radiology department with systolic in the 38H. Suspected hypovolemia from diarrhea as well as chronic orthostatic hypotension. Follow orthostatics  still with significant orthostatic hypotension (BP to 80's with RN today)  thigh high ted hose, continue midodrine 10 TID, add abdominal binder, bolus again today Consider cards c/s in AM Negative ACTH stim test   3.  Metastatic colon cancer: Under the care of Dr. [name].  He is receiving chemotherapy.  His last infusion was on 7/29.  It looks like he also got PEG filgrastim on that day.  This could be the reason for his leukocytosis.  He has stent in his sigmoid colon for his stricture. Follow CT scan abd/pelvis - with stable to improved diffuse hepatic metastatic disease - interval enlargement of low attenuation lesion in lower pancreatic head/uncinate process (consider MRI) - stable metallic stent in sigmoid colon (see report) Will need follow up outpatient  4. Candiduria: urine cx with 30,000 yeast.  Start fluconazole.    # Diabetes mellitus type 2: Monitor CBGs.  Not on insulin at home.  HbA1c was 7.8 in June.  5. History of atrial flutter/atrial fibrillation: Not thought to be Jadasia Haws good candidate for anticoagulation per previous notes.   Continue digoxin (level 0.6) Holding metoprolol with hypotension on presentation, follow for resumption   6.  History of COPD: Stable  7.  Anemia of chronic disease: Monitor hemoglobin.  No evidence of overt bleeding.  8.  Chronic combined systolic and diastolic CHF: Most  recent echocardiogram in June with preserved ejection fraction.  Currently euvolemic.  9. History of coronary artery disease status post CABG: Stable  10. Wounds bilateral feet: States that he is followed by podiatry.  They were examined pta and were noted to be improving.  Pressure Injury 07/25/19 Sacrum Stage 1 -  Intact skin with non-blanchable redness of Marce Charlesworth localized area usually over Chonte Ricke bony prominence. (Active)  07/25/19 1800  Location: Sacrum  Location Orientation:   Staging: Stage 1 -  Intact skin with non-blanchable redness of Shaakira Borrero localized area usually over Kush Farabee bony prominence.  Wound Description (Comments):   Present on Admission: Yes     Pressure Injury Ankle Right;Lateral Stage 2 -  Partial thickness loss of dermis presenting as Leray Garverick shallow open injury with Armida Vickroy red, pink wound bed without slough. (Active)     Location: Ankle  Location Orientation: Right;Lateral  Staging: Stage 2 -  Partial thickness loss of dermis presenting as Vallie Teters shallow open injury with Umi Mainor red, pink wound bed without slough.  Wound Description (Comments):   Present on Admission: Yes   DVT prophylaxis: lovenox Code Status: full  Family Communication: none at bedside Disposition:   Status is: Inpatient  Remains inpatient appropriate because:Inpatient level of care appropriate due to severity of illness   Dispo: The patient is from: SNF              Anticipated d/c is to: SNF              Anticipated d/c date is: > 3 days              Patient currently is not medically stable to d/c.   Consultants:   oncology  Procedures: (  none  Antimicrobials:  Anti-infectives (From admission, onward)   Start     Dose/Rate Route Frequency Ordered Stop   09/10/19 2200  vancomycin (VANCOCIN) 50 mg/mL oral solution 125 mg     Discontinue     125 mg Oral 4 times daily 09/10/19 1739 09/20/19 2159     Subjective: No new complaints  Objective: Vitals:   09/16/19 0658 09/16/19 1249 09/16/19 1251 09/16/19 1339    BP: 110/78 130/83 (!) 83/67 126/83  Pulse: 89 84 (!) 112 85  Resp: 19 (!) 22    Temp: 98.1 F (36.7 C) (!) 97.4 F (36.3 C)  (!) 97.4 F (36.3 C)  TempSrc:  Oral  Oral  SpO2: 99% 98% 99%   Weight:      Height:        Intake/Output Summary (Last 24 hours) at 09/16/2019 1650 Last data filed at 09/16/2019 1300 Gross per 24 hour  Intake 120 ml  Output 951 ml  Net -831 ml   Filed Weights   09/10/19 0852 09/11/19 0232  Weight: 87 kg 80.7 kg    Examination:  General: No acute distress. Cardiovascular: Heart sounds show Gerrica Cygan regular rate, and rhythm Lungs: Clear to auscultation bilaterally Abdomen: Soft,  nontender, nondistended Neurological: Alert and oriented 3. Moves all extremities 4 . Cranial nerves II through XII grossly intact. Skin: Warm and dry. No rashes or lesions. Extremities: No clubbing or cyanosis. No edema.   Data Reviewed: I have personally reviewed following labs and imaging studies  CBC: Recent Labs  Lab 09/12/19 0413 09/13/19 0415 09/14/19 0431 09/15/19 0400 09/16/19 0422  WBC 17.0* 16.2* 17.0* 15.3* 15.8*  NEUTROABS 12.8* 12.0* 12.6* 10.8* 11.3*  HGB 8.9* 9.0* 9.2* 8.7* 9.0*  HCT 28.1* 28.6* 29.5* 27.8* 28.2*  MCV 91.8 92.3 92.2 92.7 91.6  PLT 131* 155 178 203 161    Basic Metabolic Panel: Recent Labs  Lab 09/12/19 0413 09/13/19 0415 09/14/19 0431 09/15/19 0400 09/16/19 0422  NA 138 141 137 139 139  K 3.5 3.7 3.4* 3.7 3.5  CL 102 103 101 103 103  CO2 27 27 27 26 25   GLUCOSE 94 125* 123* 174* 96  BUN 16 14 16 18 16   CREATININE 1.09 1.03 1.09 1.12 0.92  CALCIUM 8.1* 8.3* 8.2* 8.1* 8.4*  MG 1.6* 1.9 1.6* 1.8 1.6*  PHOS 2.2* 2.1* 2.3* 2.2* 2.3*    GFR: Estimated Creatinine Clearance: 84.1 mL/min (by C-G formula based on SCr of 0.92 mg/dL).  Liver Function Tests: Recent Labs  Lab 09/12/19 0413 09/13/19 0415 09/14/19 0431 09/15/19 0400 09/16/19 0422  AST 19 18 18 17 16   ALT 11 11 10 10 10   ALKPHOS 156* 158* 153* 143* 132*   BILITOT 0.3 0.4 0.5 0.5 0.4  PROT 5.4* 5.6* 5.7* 5.4* 5.5*  ALBUMIN 2.6* 2.7* 2.7* 2.5* 2.5*    CBG: Recent Labs  Lab 09/15/19 1650 09/15/19 2010 09/16/19 0741 09/16/19 1209 09/16/19 1644  GLUCAP 217* 171* 120* 269* 287*     Recent Results (from the past 240 hour(s))  Blood culture (routine x 2)     Status: None   Collection Time: 09/10/19  1:52 PM   Specimen: BLOOD  Result Value Ref Range Status   Specimen Description   Final    BLOOD RIGHT ANTECUBITAL Performed at [facility], [place] [address], [place], [place] [address]    Special Requests   Final    BOTTLES DRAWN AEROBIC AND ANAEROBIC Blood Culture adequate volume Performed at [place] [address], [place], [place] [address]    Culture   Final    NO GROWTH 5 DAYS Performed at [facility], [place] [address], [place], [place] [address]    Report Status 09/15/2019 FINAL  Final  SARS Coronavirus 2 by RT PCR (hospital order, performed in [facility] hospital lab) Nasopharyngeal Nasopharyngeal Swab     Status: None   Collection Time: 09/10/19  3:33 PM   Specimen: Nasopharyngeal Swab  Result Value Ref Range Status   SARS Coronavirus 2 NEGATIVE NEGATIVE Final    Comment: (NOTE) SARS-CoV-2 target nucleic acids are NOT DETECTED.  The SARS-CoV-2 RNA is generally detectable in upper and lower respiratory specimens during the acute phase of infection. The lowest concentration of SARS-CoV-2 viral copies this assay can detect is 250 copies / mL. Cohl Behrens negative result does not preclude SARS-CoV-2 infection and should not be used as the sole basis for treatment or other patient management decisions.  Chantel Teti negative result may occur with improper specimen collection / handling, submission of specimen other than nasopharyngeal swab, presence of viral mutation(s) within the areas targeted by this assay, and inadequate number of viral copies (<250 copies / mL). Emett Stapel negative result  must be  combined with clinical observations, patient history, and epidemiological information.  Fact Sheet for Patients:   StrictlyIdeas.no  Fact Sheet for Healthcare Providers: BankingDealers.co.za  This test is not yet approved or  cleared by the [place] FDA and has been authorized for detection and/or diagnosis of SARS-CoV-2 by FDA under an Emergency Use Authorization (EUA).  This EUA will remain in effect (meaning this test can be used) for the duration of the COVID-19 declaration under Section 564(b)(1) of the Act, 21 U.S.C. section 360bbb-3(b)(1), unless the authorization is terminated or revoked sooner.  Performed at [facility], [place] [address] [place], [place] [address]   Urine culture     Status: Abnormal   Collection Time: 09/10/19 10:39 PM   Specimen: Urine, Clean Catch  Result Value Ref Range Status   Specimen Description   Final    URINE, CLEAN CATCH Performed at [facility], [place] [address] [place], [place] [address]    Special Requests   Final    NONE Performed at [facility], [place] [address], [place], [place] [address]    Culture 30,000 COLONIES/mL YEAST (Markail Diekman)  Final   Report Status 09/11/2019 FINAL  Final  C Difficile Quick Screen w PCR reflex     Status: Abnormal   Collection Time: 09/10/19 10:39 PM   Specimen: STOOL  Result Value Ref Range Status   C Diff antigen POSITIVE (Romie Tay) NEGATIVE Final   C Diff toxin NEGATIVE NEGATIVE Final   C Diff interpretation Results are indeterminate. See PCR results.  Final    Comment: Performed at [facility], [place] [address], [place], [place] [address]  Gastrointestinal Panel by PCR , Stool     Status: None   Collection Time: 09/10/19 10:39 PM   Specimen: STOOL  Result Value Ref Range Status   Campylobacter species NOT DETECTED NOT DETECTED Final   Plesimonas shigelloides NOT DETECTED NOT DETECTED  Final   Salmonella species NOT DETECTED NOT DETECTED Final   Yersinia enterocolitica NOT DETECTED NOT DETECTED Final   Vibrio species NOT DETECTED NOT DETECTED Final   Vibrio cholerae NOT DETECTED NOT DETECTED Final   Enteroaggregative E coli (EAEC) NOT DETECTED NOT DETECTED Final   Enteropathogenic E coli (EPEC) NOT DETECTED NOT DETECTED Final   Enterotoxigenic E coli (ETEC) NOT DETECTED NOT DETECTED Final   Shiga like toxin producing E coli (STEC) NOT DETECTED NOT DETECTED Final   Shigella/Enteroinvasive E coli (EIEC) NOT DETECTED NOT DETECTED Final   Cryptosporidium NOT DETECTED NOT DETECTED Final   Cyclospora cayetanensis NOT DETECTED NOT DETECTED Final   Entamoeba histolytica NOT DETECTED NOT DETECTED Final   Giardia lamblia NOT DETECTED NOT DETECTED Final   Adenovirus F40/41 NOT DETECTED NOT DETECTED Final   Astrovirus NOT DETECTED NOT DETECTED Final   Norovirus GI/GII NOT DETECTED NOT DETECTED Final   Rotavirus Jacody Beneke NOT DETECTED NOT DETECTED Final   Sapovirus (I, II, IV, and V) NOT DETECTED NOT DETECTED Final    Comment: Performed at [facility], [address] [place], [place] [address]  C. Diff by PCR, Reflexed     Status: Abnormal   Collection Time: 09/10/19 10:39 PM  Result Value Ref Range Status   Toxigenic C. Difficile by PCR POSITIVE (Glennon Kopko) NEGATIVE Final    Comment: Positive for toxigenic C. difficile with little to no toxin production. Only treat if clinical presentation suggests symptomatic illness. Performed at [facility], [place],  [place] [address]   Blood culture (routine x 2)     Status: None   Collection Time: 09/11/19  4:10 AM   Specimen: BLOOD  Result Value Ref Range Status   Specimen Description   Final    BLOOD RIGHT ARM Performed at [place] [address] [place], [place] [address]    Special Requests   Final    BOTTLES DRAWN AEROBIC AND ANAEROBIC Blood Culture adequate volume Performed at [place] [address], [place], [place] [address]    Culture   Final    NO GROWTH 5 DAYS Performed at [facility], [place] [address] [place], [place] [address]    Report Status 09/16/2019 FINAL  Final  MRSA PCR Screening     Status: None   Collection Time: 09/11/19  7:05 AM   Specimen: Nasal Mucosa; Nasopharyngeal  Result Value Ref Range Status   MRSA by PCR NEGATIVE NEGATIVE Final    Comment:        The GeneXpert MRSA Assay (FDA approved for NASAL specimens only), is one component of Tiernan Suto comprehensive MRSA colonization surveillance program. It is not intended to diagnose MRSA infection nor to guide or monitor treatment for MRSA infections. Performed at [facility], [place] [address] [place], [place] [address]          Radiology Studies: No results found.      Scheduled Meds: . Chlorhexidine Gluconate Cloth  6 each Topical Daily  . digoxin  0.25 mg Oral Daily  . enoxaparin (LOVENOX) injection  40 mg Subcutaneous Q24H  . feeding supplement (KATE FARMS STANDARD 1.4)  325 mL Oral BID BM  . insulin aspart  0-15 Units Subcutaneous TID WC  . midodrine  10 mg Oral TID WC  . multivitamin with minerals  1 tablet Oral Daily  . mupirocin cream   Topical Daily  . sodium chloride flush  10-40 mL Intracatheter Q12H  . vancomycin  125 mg Oral QID   Continuous Infusions: . lactated ringers       LOS: 6 days    Time spent: over 30 min    Fayrene Helper, MD [facility]   To contact the attending provider between 7A-7P or the covering provider during after hours 7P-7A, please log into the web site [email or web address] and access using universal [place] password for that web site. If you do not have the password, please call the hospital operator.  09/16/2019, 4:50 PM

## 2019-09-16 NOTE — Progress Notes (Signed)
Md assessed pt, VS repeated per MD request. SRP. RN

## 2019-09-16 NOTE — Plan of Care (Signed)
  Problem: Clinical Measurements: Goal: Diagnostic test results will improve Outcome: Progressing   

## 2019-09-17 ENCOUNTER — Inpatient Hospital Stay: Payer: Medicare HMO | Admitting: Nurse Practitioner

## 2019-09-17 ENCOUNTER — Inpatient Hospital Stay: Payer: Medicare HMO

## 2019-09-17 DIAGNOSIS — B3741 Candidal cystitis and urethritis: Secondary | ICD-10-CM

## 2019-09-17 DIAGNOSIS — I951 Orthostatic hypotension: Secondary | ICD-10-CM

## 2019-09-17 HISTORY — DX: Candidal cystitis and urethritis: B37.41

## 2019-09-17 LAB — COMPREHENSIVE METABOLIC PANEL
ALT: 10 U/L (ref 0–44)
AST: 16 U/L (ref 15–41)
Albumin: 2.6 g/dL — ABNORMAL LOW (ref 3.5–5.0)
Alkaline Phosphatase: 130 U/L — ABNORMAL HIGH (ref 38–126)
Anion gap: 10 (ref 5–15)
BUN: 15 mg/dL (ref 8–23)
CO2: 26 mmol/L (ref 22–32)
Calcium: 8.5 mg/dL — ABNORMAL LOW (ref 8.9–10.3)
Chloride: 101 mmol/L (ref 98–111)
Creatinine, Ser: 0.92 mg/dL (ref 0.61–1.24)
GFR calc Af Amer: >60 mL/min (ref >60)
GFR calc non Af Amer: >60 mL/min (ref >60)
Glucose, Bld: 143 mg/dL — ABNORMAL HIGH (ref 70–99)
Potassium: 3.5 mmol/L (ref 3.5–5.1)
Sodium: 137 mmol/L (ref 135–145)
Total Bilirubin: 0.6 mg/dL (ref 0.3–1.2)
Total Protein: 5.8 g/dL — ABNORMAL LOW (ref 6.5–8.1)

## 2019-09-17 LAB — PHOSPHORUS: Phosphorus: 2.2 mg/dL — ABNORMAL LOW (ref 2.5–4.6)

## 2019-09-17 LAB — CBC WITH DIFFERENTIAL/PLATELET
Abs Immature Granulocytes: 0.4 10*3/uL — ABNORMAL HIGH (ref 0.00–0.07)
Basophils Absolute: 0.1 10*3/uL (ref 0.0–0.1)
Basophils Relative: 1 %
Eosinophils Absolute: 0.2 10*3/uL (ref 0.0–0.5)
Eosinophils Relative: 1 %
HCT: 28.3 % — ABNORMAL LOW (ref 39.0–52.0)
Hemoglobin: 9.1 g/dL — ABNORMAL LOW (ref 13.0–17.0)
Immature Granulocytes: 2 %
Lymphocytes Relative: 9 %
Lymphs Abs: 1.6 10*3/uL (ref 0.7–4.0)
MCH: 29.2 pg (ref 26.0–34.0)
MCHC: 32.2 g/dL (ref 30.0–36.0)
MCV: 90.7 fL (ref 80.0–100.0)
Monocytes Absolute: 2.2 10*3/uL — ABNORMAL HIGH (ref 0.1–1.0)
Monocytes Relative: 13 %
Neutro Abs: 13 10*3/uL — ABNORMAL HIGH (ref 1.7–7.7)
Neutrophils Relative %: 74 %
Platelets: 242 10*3/uL (ref 150–400)
RBC: 3.12 MIL/uL — ABNORMAL LOW (ref 4.22–5.81)
RDW: 16.3 % — ABNORMAL HIGH (ref 11.5–15.5)
WBC: 17.5 10*3/uL — ABNORMAL HIGH (ref 4.0–10.5)
nRBC: 0.1 % (ref 0.0–0.2)

## 2019-09-17 LAB — GLUCOSE, CAPILLARY
Glucose-Capillary: 215 mg/dL — ABNORMAL HIGH (ref 70–99)
Glucose-Capillary: 131 mg/dL — ABNORMAL HIGH (ref 70–99)
Glucose-Capillary: 159 mg/dL — ABNORMAL HIGH (ref 70–99)
Glucose-Capillary: 225 mg/dL — ABNORMAL HIGH (ref 70–99)
Glucose-Capillary: 177 mg/dL — ABNORMAL HIGH (ref 70–99)

## 2019-09-17 LAB — MAGNESIUM: Magnesium: 1.8 mg/dL (ref 1.7–2.4)

## 2019-09-17 LAB — ACTH: C206 ACTH: 13 pg/mL (ref 7.2–63.3)

## 2019-09-17 MED ORDER — POTASSIUM CHLORIDE CRYS ER 20 MEQ PO TBCR
40.0000 meq | EXTENDED_RELEASE_TABLET | Freq: Once | ORAL | Status: DC
  Filled 2019-09-17: qty 2

## 2019-09-17 MED ORDER — K PHOS MONO-SOD PHOS DI & MONO 155-852-130 MG PO TABS
500.0000 mg | ORAL_TABLET | Freq: Once | ORAL | Status: DC
  Filled 2019-09-17: qty 2

## 2019-09-17 MED ORDER — SODIUM CHLORIDE 0.9 % IV SOLN: INTRAVENOUS | Status: DC

## 2019-09-17 NOTE — Assessment & Plan Note (Signed)
-  Continue digoxin -Not an anticoagulation candidate per previous notes

## 2019-09-17 NOTE — Assessment & Plan Note (Signed)
-   Continue SSI and CBG monitoring ?

## 2019-09-17 NOTE — Assessment & Plan Note (Addendum)
-  Patient endorses that his diarrhea has been persistent and ongoing while he has been on FOLFOX.  He does have a history of confirmed C. difficile in March 2021 and completed treatment at that time.  This hospitalization he was retested he was retested and is antigen positive but toxin negative; this is consistent with colonization and a carrier state, not active infection.  He was started empirically on oral vancomycin on admission and has completed 7 days treatment as of 09/17/2019.  Also discussed this with ID, also in agreement for stopping vancomycin in lieu of no active infection -At this point, suspect the ongoing diarrhea is in the setting of ongoing FOLFOX treatment.  Furthermore, he had no significant abdominal pain which would be expected with a C. difficile infection as well. -Patient informed of discontinuation of vancomycin and further supportive care/monitoring -Overall diarrhea is improving especially with use of Imodium

## 2019-09-17 NOTE — Assessment & Plan Note (Addendum)
-   s/p cycle 7 of FOLFOX on 08/27/19 - further management per oncology -Overall, his functional status seems to continue to decline especially in setting of worsening diarrhea that appears more likely due to chemotherapy adverse effects - outpatient followup for continuing treatment

## 2019-09-17 NOTE — Hospital Course (Addendum)
Clarence Andrews is a 69 yo CM with PMH head/neck cancer (s/p surgery/chemoradiation), orthostatic hypotension, DMII, CAD (s/p CABG), CHF, colorectal adenocarcinoma (s/p cycle 7 FOLFOX 08/27/19), hx Cdiff March 2021 who presented with weakness and hypotension.  He has a known history of orthostatic hypotension and is on midodrine at home.  Secondary causes from previous work-up have also been negative.  He continues to have approximately 2-3 diarrheal bowel movements at home on a daily basis. On admission he underwent C. difficile testing and was antigen positive but toxin negative (he had been placed empirically on oral vancomycin on admission while awaiting stool specimen results). He was taken off oral vanc after 7 days treatment and after discussion with ID as well.   During hospitalization he also remained hypotensive and with positive orthostatics. He was restarted on IVF given his overall volume depleted state.  His blood pressure supine improved.  He was also continued on midodrine.  He did continue to have orthostatic hypotension upon standing however was asymptomatic.  He was recommended to continue slow change in positions when going from lying to standing.  His diarrhea also slowly improved especially with imodium use.  He worked with physical therapy during hospitalization and was recommended for ongoing rehab at discharge.  He was also followed by oncology during hospitalization and will continue on treatment at discharge.

## 2019-09-17 NOTE — Progress Notes (Addendum)
PROGRESS NOTE    Clarence Andrews.   KCL:[id number]  DOB: 1950/04/19  DOA: 09/10/2019     7  PCP: Patient, No Pcp Per  CC: weakness, dizziness  Hospital Course: Clarence Andrews is a 69 yo CM with PMH head/neck cancer (s/p surgery/chemoradiation), orthostatic hypotension, DMII, CAD (s/p CABG), CHF, colorectal adenocarcinoma (s/p cycle 7 FOLFOX 08/27/19), hx Cdiff March 2021 who presented with weakness and hypotension.  He has a known history of orthostatic hypotension and is on midodrine at home.  Secondary causes from previous work-up have also been negative.  He continues to have approximately 2-3 diarrheal bowel movements at home on a daily basis. On admission he underwent C. difficile testing and was antigen positive but toxin negative (he had been placed empirically on oral vancomycin on admission while awaiting stool specimen results). During hospitalization he also remained hypotensive and with positive orthostatics.   Interval History:  No events overnight.  Patient still endorsing ongoing diarrhea, no abdominal pain.  Denies any fevers, chills, sweats.  Old records reviewed in assessment of this patient  ROS: Constitutional: positive for fatigue, Respiratory: negative for cough, Cardiovascular: negative for chest pain and Gastrointestinal: positive for diarrhea  Assessment & Plan: Cancer of left colon ([place]) - s/p cycle 7 of FOLFOX on 08/27/19 - further management per oncology -Overall, his functional status seems to continue to decline especially in setting of worsening diarrhea that appears more likely due to chemotherapy adverse effects  Chronic diarrhea -Patient endorses that his diarrhea has been persistent and ongoing while he has been on FOLFOX.  He does have a history of confirmed C. difficile in March 2021 and completed treatment at that time.  This hospitalization he was retested he was retested and is antigen positive but toxin negative; this is consistent with  colonization and a carrier state, not active infection.  He was started empirically on oral vancomycin on admission and has completed 7 days treatment as of 09/17/2019.  Also discussed this with ID, also in agreement for stopping vancomycin in lieu of no active infection -At this point, suspect the ongoing diarrhea is in the setting of ongoing FOLFOX treatment.  Furthermore, he had no significant abdominal pain which would be expected with a C. difficile infection as well. -Patient informed of discontinuation of vancomycin and further supportive care/monitoring -Fluids restarted  Orthostatic hypotension -Patient has history of hypotension.  This may likely be in setting of ongoing chronic diarrhea and volume loss.  He also appears volume depleted on exam in general.  He remains on midodrine at home prior to admission and has had SCDs and abdominal binder attempted during hospitalization with no significant improvement -Previous echo reviewed, he has adequate ventricular function and should be able to withstand adequate volume challenge.  Patient instructed to inform staff if he feels short of breath with fluids -Start on IVF and monitor response.  Suspect this will take at least 2 to 3 days to adequately volume replete.  -If and when orthostasis improves, hopeful that patient will be able to work with physical therapy in order to pursue disposition planning as they have been unable to work with patient due to inability to stand from dizziness and hypotension  Candida cystitis - urine noted with yeast; given immunocompromised state, will complete fluconazole course   Type 2 diabetes mellitus without complication (HCC) -Continue SSI and CBG monitoring  Atrial flutter ([place]) -Continue digoxin -Not an anticoagulation candidate per previous notes   Antimicrobials: Vancomycin p.o. 09/10/2019>> 09/16/2019 Fluconazole 09/16/2019>>  present  DVT prophylaxis: Lovenox Code Status: Full Family  Communication: None present Disposition Plan:  Status is: Inpatient  Remains inpatient appropriate because:Hemodynamically unstable, IV treatments appropriate due to intensity of illness or inability to take PO and Inpatient level of care appropriate due to severity of illness   Dispo: The patient is from: Home              Anticipated d/c is to: Pending PT eval              Anticipated d/c date is: > 3 days              Patient currently is not medically stable to d/c.       Objective: Blood pressure 112/72, pulse 93, temperature (!) 97.5 F (36.4 C), temperature source Oral, resp. rate 19, height 6\' 2"  (1.88 m), weight 80.7 kg, SpO2 100 %.  Examination: General appearance: Chronically ill-appearing elderly man resting in bed in no distress Head: Normocephalic, without obvious abnormality, atraumatic Eyes: EOMI Lungs: clear to auscultation bilaterally Heart: regular rate and rhythm and S1, S2 normal Abdomen: normal findings: bowel sounds normal and soft, non-tender Extremities: No edema Skin: mobility and turgor normal Neurologic: Grossly normal  Data Reviewed: I have personally reviewed following labs and imaging studies Results for orders placed or performed during the hospital encounter of 09/10/19 (from the past 24 hour(s))  Glucose, capillary     Status: Abnormal   Collection Time: 09/16/19  4:44 PM  Result Value Ref Range   Glucose-Capillary 287 (H) 70 - 99 mg/dL  CBC with Differential/Platelet     Status: Abnormal   Collection Time: 09/17/19  4:20 AM  Result Value Ref Range   WBC 17.5 (H) 4.0 - 10.5 K/uL   RBC 3.12 (L) 4.22 - 5.81 MIL/uL   Hemoglobin 9.1 (L) 13.0 - 17.0 g/dL   HCT 28.3 (L) 39 - 52 %   MCV 90.7 80.0 - 100.0 fL   MCH 29.2 26.0 - 34.0 pg   MCHC 32.2 30.0 - 36.0 g/dL   RDW 16.3 (H) 11.5 - 15.5 %   Platelets 242 150 - 400 K/uL   nRBC 0.1 0.0 - 0.2 %   Neutrophils Relative % 74 %   Neutro Abs 13.0 (H) 1.7 - 7.7 K/uL   Lymphocytes Relative 9 %    Lymphs Abs 1.6 0.7 - 4.0 K/uL   Monocytes Relative 13 %   Monocytes Absolute 2.2 (H) 0 - 1 K/uL   Eosinophils Relative 1 %   Eosinophils Absolute 0.2 0 - 0 K/uL   Basophils Relative 1 %   Basophils Absolute 0.1 0 - 0 K/uL   Immature Granulocytes 2 %   Abs Immature Granulocytes 0.40 (H) 0.00 - 0.07 K/uL  Comprehensive metabolic panel     Status: Abnormal   Collection Time: 09/17/19  4:20 AM  Result Value Ref Range   Sodium 137 135 - 145 mmol/L   Potassium 3.5 3.5 - 5.1 mmol/L   Chloride 101 98 - 111 mmol/L   CO2 26 22 - 32 mmol/L   Glucose, Bld 143 (H) 70 - 99 mg/dL   BUN 15 8 - 23 mg/dL   Creatinine, Ser 0.92 0.61 - 1.24 mg/dL   Calcium 8.5 (L) 8.9 - 10.3 mg/dL   Total Protein 5.8 (L) 6.5 - 8.1 g/dL   Albumin 2.6 (L) 3.5 - 5.0 g/dL   AST 16 15 - 41 U/L   ALT 10 0 - 44 U/L  Alkaline Phosphatase 130 (H) 38 - 126 U/L   Total Bilirubin 0.6 0.3 - 1.2 mg/dL   GFR calc non Af Amer >60 >60 mL/min   GFR calc Af Amer >60 >60 mL/min   Anion gap 10 5 - 15  Magnesium     Status: None   Collection Time: 09/17/19  4:20 AM  Result Value Ref Range   Magnesium 1.8 1.7 - 2.4 mg/dL  Phosphorus     Status: Abnormal   Collection Time: 09/17/19  4:20 AM  Result Value Ref Range   Phosphorus 2.2 (L) 2.5 - 4.6 mg/dL  Glucose, capillary     Status: Abnormal   Collection Time: 09/17/19  7:34 AM  Result Value Ref Range   Glucose-Capillary 131 (H) 70 - 99 mg/dL  Glucose, capillary     Status: Abnormal   Collection Time: 09/17/19 11:54 AM  Result Value Ref Range   Glucose-Capillary 159 (H) 70 - 99 mg/dL    Recent Results (from the past 240 hour(s))  Blood culture (routine x 2)     Status: None   Collection Time: 09/10/19  1:52 PM   Specimen: BLOOD  Result Value Ref Range Status   Specimen Description   Final    BLOOD RIGHT ANTECUBITAL Performed at [facility], [place] [address] [place], [place] [address]    Special Requests   Final    BOTTLES DRAWN AEROBIC AND ANAEROBIC  Blood Culture adequate volume Performed at [place] [address] [place], [place] [address]    Culture   Final    NO GROWTH 5 DAYS Performed at [facility], [place] [address] [place], [place] [address]    Report Status 09/15/2019 FINAL  Final  SARS Coronavirus 2 by RT PCR (hospital order, performed in [facility] hospital lab) Nasopharyngeal Nasopharyngeal Swab     Status: None   Collection Time: 09/10/19  3:33 PM   Specimen: Nasopharyngeal Swab  Result Value Ref Range Status   SARS Coronavirus 2 NEGATIVE NEGATIVE Final    Comment: (NOTE) SARS-CoV-2 target nucleic acids are NOT DETECTED.  The SARS-CoV-2 RNA is generally detectable in upper and lower respiratory specimens during the acute phase of infection. The lowest concentration of SARS-CoV-2 viral copies this assay can detect is 250 copies / mL. A negative result does not preclude SARS-CoV-2 infection and should not be used as the sole basis for treatment or other patient management decisions.  A negative result may occur with improper specimen collection / handling, submission of specimen other than nasopharyngeal swab, presence of viral mutation(s) within the areas targeted by this assay, and inadequate number of viral copies (<250 copies / mL). A negative result must be combined with clinical observations, patient history, and epidemiological information.  Fact Sheet for Patients:   StrictlyIdeas.no  Fact Sheet for Healthcare Providers: BankingDealers.co.za  This test is not yet approved or  cleared by the [place] FDA and has been authorized for detection and/or diagnosis of SARS-CoV-2 by FDA under an Emergency Use Authorization (EUA).  This EUA will remain in effect (meaning this test can be used) for the duration of the COVID-19 declaration under Section 564(b)(1) of the Act, 21 U.S.C. section 360bbb-3(b)(1), unless the authorization is  terminated or revoked sooner.  Performed at [facility], [place] [address], [place], [place] [address]   Urine culture     Status: Abnormal   Collection Time: 09/10/19 10:39 PM   Specimen: Urine, Clean Catch  Result Value Ref Range Status   Specimen Description   Final    URINE, CLEAN CATCH Performed at [facility], [place] [address] [place], [place] [address]    Special Requests   Final    NONE Performed at [facility], [place] [address], [place], [place] [address]    Culture 30,000 COLONIES/mL YEAST (A)  Final   Report Status 09/11/2019 FINAL  Final  C Difficile Quick Screen w PCR reflex     Status: Abnormal   Collection Time: 09/10/19 10:39 PM   Specimen: STOOL  Result Value Ref Range Status   C Diff antigen POSITIVE (A) NEGATIVE Final   C Diff toxin NEGATIVE NEGATIVE Final   C Diff interpretation Results are indeterminate. See PCR results.  Final    Comment: Performed at [facility], [place] [address], [place], [place] [address]  Gastrointestinal Panel by PCR , Stool     Status: None   Collection Time: 09/10/19 10:39 PM   Specimen: STOOL  Result Value Ref Range Status   Campylobacter species NOT DETECTED NOT DETECTED Final   Plesimonas shigelloides NOT DETECTED NOT DETECTED Final   Salmonella species NOT DETECTED NOT DETECTED Final   Yersinia enterocolitica NOT DETECTED NOT DETECTED Final   Vibrio species NOT DETECTED NOT DETECTED Final   Vibrio cholerae NOT DETECTED NOT DETECTED Final   Enteroaggregative E coli (EAEC) NOT DETECTED NOT DETECTED Final   Enteropathogenic E coli (EPEC) NOT DETECTED NOT DETECTED Final   Enterotoxigenic E coli (ETEC) NOT DETECTED NOT DETECTED Final   Shiga like toxin producing E coli (STEC) NOT DETECTED NOT DETECTED Final   Shigella/Enteroinvasive E coli (EIEC) NOT DETECTED NOT DETECTED Final   Cryptosporidium NOT DETECTED NOT DETECTED Final   Cyclospora cayetanensis NOT  DETECTED NOT DETECTED Final   Entamoeba histolytica NOT DETECTED NOT DETECTED Final   Giardia lamblia NOT DETECTED NOT DETECTED Final   Adenovirus F40/41 NOT DETECTED NOT DETECTED Final   Astrovirus NOT DETECTED NOT DETECTED Final   Norovirus GI/GII NOT DETECTED NOT DETECTED Final   Rotavirus A NOT DETECTED NOT DETECTED Final   Sapovirus (I, II, IV, and V) NOT DETECTED NOT DETECTED Final    Comment: Performed at [facility], [place] [place], [place] [address]  C. Diff by PCR, Reflexed     Status: Abnormal   Collection Time: 09/10/19 10:39 PM  Result Value Ref Range Status   Toxigenic C. Difficile by PCR POSITIVE (A) NEGATIVE Final    Comment: Positive for toxigenic C. difficile with little to no toxin production. Only treat if clinical presentation suggests symptomatic illness. Performed at [facility], [place] [address] [place], [place] [address]   Blood culture (routine x 2)     Status: None   Collection Time: 09/11/19  4:10 AM   Specimen: BLOOD  Result Value Ref Range Status   Specimen Description   Final    BLOOD RIGHT ARM Performed at [place] [address] [place], [place] [address]    Special Requests   Final    BOTTLES DRAWN AEROBIC AND ANAEROBIC Blood Culture adequate volume Performed at [place] [address], [place], [place] [address]    Culture   Final    NO GROWTH 5 DAYS Performed at [facility], [place] [address] [place], [place] [address]    Report Status 09/16/2019 FINAL  Final  MRSA PCR Screening  Status: None   Collection Time: 09/11/19  7:05 AM   Specimen: Nasal Mucosa; Nasopharyngeal  Result Value Ref Range Status   MRSA by PCR NEGATIVE NEGATIVE Final    Comment:        The GeneXpert MRSA Assay (FDA approved for NASAL specimens only), is one component of a comprehensive MRSA colonization surveillance program. It is not intended to diagnose MRSA infection nor to guide  or monitor treatment for MRSA infections. Performed at [facility], [place] [address], [place], [place] [address]      Radiology Studies: No results found. CT ABDOMEN PELVIS W CONTRAST  Final Result    DG Chest Port 1 View  Final Result       Scheduled Meds: . Chlorhexidine Gluconate Cloth  6 each Topical Daily  . digoxin  0.25 mg Oral Daily  . enoxaparin (LOVENOX) injection  40 mg Subcutaneous Q24H  . feeding supplement (KATE FARMS STANDARD 1.4)  325 mL Oral BID BM  . fluconazole  200 mg Oral Daily  . insulin aspart  0-15 Units Subcutaneous TID WC  . midodrine  10 mg Oral TID WC  . multivitamin with minerals  1 tablet Oral Daily  . mupirocin cream   Topical Daily  . sodium chloride flush  10-40 mL Intracatheter Q12H   PRN Meds: acetaminophen **OR** acetaminophen, ondansetron **OR** ondansetron (ZOFRAN) IV, sodium chloride flush Continuous Infusions: . sodium chloride 75 mL/hr at 09/17/19 1243      LOS: 7 days  Time spent: Greater than 50% of the 35 minute visit was spent in counseling/coordination of care for the patient as laid out in the A&P.   Dwyane Dee, MD [facility] 09/17/2019, 4:11 PM   Contact via secure chat.  To contact the attending provider between 7A-7P or the covering provider during after hours 7P-7A, please log into the web site [email or web address] and access using universal [place] password for that web site. If you do not have the password, please call the hospital operator.

## 2019-09-17 NOTE — Assessment & Plan Note (Addendum)
-   urine noted with yeast; given immunocompromised state, will complete fluconazole course x 14 days

## 2019-09-17 NOTE — Assessment & Plan Note (Addendum)
-  Patient has history of hypotension.  This may likely be in setting of ongoing chronic diarrhea and volume loss.  He also appears volume depleted on exam in general.  He remains on midodrine at home prior to admission and has had SCDs and abdominal binder attempted during hospitalization with no significant improvement -Previous echo reviewed, he has adequate ventricular function and should be able to withstand adequate volume challenge.  Patient instructed to inform staff if he feels short of breath with fluids -Start on IVF and monitor response.  Suspect this will take at least 2 to 3 days to adequately volume replete.  -If and when orthostasis improves, hopeful that patient will be able to work with physical therapy in order to pursue disposition planning as they have been unable to work with patient due to inability to stand from dizziness and hypotension - still orthostatic on 8/18, however IVF were not on during exam; IVF restarted and will continue to assess response. -appears persistently orthostatic positive however is asymptomatic; educated patient on positional change techniques to include sitting up for a few minutes from a laying position and then upon standing to also stand with assistance and wait a few minutes prior to walking as well; continue TED hose and abdominal binder when ambulating

## 2019-09-18 LAB — CBC WITH DIFFERENTIAL/PLATELET
Abs Immature Granulocytes: 0.3 10*3/uL — ABNORMAL HIGH (ref 0.00–0.07)
Basophils Absolute: 0.1 10*3/uL (ref 0.0–0.1)
Basophils Relative: 0 %
Eosinophils Absolute: 0.3 10*3/uL (ref 0.0–0.5)
Eosinophils Relative: 2 %
HCT: 26.4 % — ABNORMAL LOW (ref 39.0–52.0)
Hemoglobin: 8.3 g/dL — ABNORMAL LOW (ref 13.0–17.0)
Immature Granulocytes: 2 %
Lymphocytes Relative: 10 %
Lymphs Abs: 1.5 10*3/uL (ref 0.7–4.0)
MCH: 28.9 pg (ref 26.0–34.0)
MCHC: 31.4 g/dL (ref 30.0–36.0)
MCV: 92 fL (ref 80.0–100.0)
Monocytes Absolute: 2.2 10*3/uL — ABNORMAL HIGH (ref 0.1–1.0)
Monocytes Relative: 16 %
Neutro Abs: 10 10*3/uL — ABNORMAL HIGH (ref 1.7–7.7)
Neutrophils Relative %: 70 %
Platelets: 231 10*3/uL (ref 150–400)
RBC: 2.87 MIL/uL — ABNORMAL LOW (ref 4.22–5.81)
RDW: 16.2 % — ABNORMAL HIGH (ref 11.5–15.5)
WBC: 14.4 10*3/uL — ABNORMAL HIGH (ref 4.0–10.5)
nRBC: 0 % (ref 0.0–0.2)

## 2019-09-18 LAB — BASIC METABOLIC PANEL
Anion gap: 8 (ref 5–15)
BUN: 14 mg/dL (ref 8–23)
CO2: 26 mmol/L (ref 22–32)
Calcium: 8.1 mg/dL — ABNORMAL LOW (ref 8.9–10.3)
Chloride: 103 mmol/L (ref 98–111)
Creatinine, Ser: 0.84 mg/dL (ref 0.61–1.24)
GFR calc Af Amer: >60 mL/min (ref >60)
GFR calc non Af Amer: >60 mL/min (ref >60)
Glucose, Bld: 111 mg/dL — ABNORMAL HIGH (ref 70–99)
Potassium: 3.6 mmol/L (ref 3.5–5.1)
Sodium: 137 mmol/L (ref 135–145)

## 2019-09-18 LAB — GLUCOSE, CAPILLARY
Glucose-Capillary: 105 mg/dL — ABNORMAL HIGH (ref 70–99)
Glucose-Capillary: 198 mg/dL — ABNORMAL HIGH (ref 70–99)
Glucose-Capillary: 191 mg/dL — ABNORMAL HIGH (ref 70–99)
Glucose-Capillary: 200 mg/dL — ABNORMAL HIGH (ref 70–99)

## 2019-09-18 LAB — MAGNESIUM: Magnesium: 1.6 mg/dL — ABNORMAL LOW (ref 1.7–2.4)

## 2019-09-18 MED ORDER — LOPERAMIDE HCL 2 MG PO CAPS
2.0000 mg | ORAL_CAPSULE | ORAL | Status: DC | PRN
  Administered 2019-09-18 – 2019-09-21 (×6): 2 mg via ORAL
  Filled 2019-09-18 (×6): qty 1

## 2019-09-18 MED ORDER — MAGNESIUM OXIDE 400 (241.3 MG) MG PO TABS
800.0000 mg | ORAL_TABLET | Freq: Once | ORAL | Status: AC
  Administered 2019-09-18: 800 mg via ORAL
  Filled 2019-09-18: qty 2

## 2019-09-18 NOTE — Progress Notes (Signed)
Occupational Therapy Treatment Patient Details Name: Clarence Andrews. MRN: [id number] DOB: Mar 02, 1950 Today's Date: 09/18/2019    History of present illness 69 y.o. 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 difficile colitis in March, CAD, diabetes mellitus type 2 who presented from a skilled nursing facility to undergo routine CT scan for his cancer staging and was found to be hypotensive with c/o weakness and lightheaded. Admitted with c.diff   OT comments  Patient supine in bed watching television. Therapist assisted patient with donning abdominal binder and thigh high TED hose prior to attempts at sitting. Patient able to roll left and right using bed rails while therapist placed abdominal binder. Patient also assisted pulling up TED hose from knees to thigh. Patient's HOB placed at 60 degrees and in that position several minutes prior to transfer to side of bed. Patient performed UE exercises with theraband to work on strengthening and improving activity tolerance. Patient able to stand from elevated bed today with min assist and use of RW. Patient able to take steps to the head of the bed. Mod assist to return to supine. Patient not symptomatic during treatment - no complaints of dizziness or light headedness. Patient exhibits forward head posture and reports cervical neck pain radiating to right shoulder and wanting to lay down because of neck pain. Patient polite and participatory - but at times stopping therapy to watch food show on television. Improved tolerance today. Cont POC.  BP monitored w/ activity, see below: Supine: 132/82  HR 86 HOB at 60 deg:  133/77 HR 88 Sitting EOB:  88/58 HR 97 Sitting after UE exercise 2 min: 90/66 HR 105 Sitting after UE exercise & 4 min: 100/60 HR 112 After standing approx 15 sec: 83/67 HR 107   Follow Up Recommendations  SNF    Equipment  Recommendations  None recommended by OT (Defer to next venue)    Recommendations for Other Services      Precautions / Restrictions Precautions Precautions: Fall Precaution Comments: Orthostatic BP Restrictions Weight Bearing Restrictions: No       Mobility Bed Mobility Overal bed mobility: Needs Assistance Bed Mobility: Rolling;Supine to Sit;Sit to Supine Rolling: Min assist   Supine to sit: Max assist;HOB elevated Sit to supine: Mod assist   General bed mobility comments: Min assist to roll left and right with use of bed rails. Max assist for supine to sit with HOB elevated to 60 degrees. Patient exhibited difficulty pulling himself into upright position and to the left and also needed assistance for LEs. Patient mod assist for LEs to transfer back into supine.  Transfers Overall transfer level: Needs assistance Equipment used: Rolling walker (2 wheeled) Transfers: Sit to/from Stand Sit to Stand: Min assist;From elevated surface         General transfer comment: Patient min assist to stand from elevated bed with RW. Patient able to take steps to the head of the bed with RW and verbal cues. Patient is unsymptomatic in regards to BP but complaining of neck pain and wants to return to supine position.    Balance Overall balance assessment: Needs assistance Sitting-balance support: No upper extremity supported;Feet supported;Single extremity supported Sitting balance-Leahy Scale: Poor Sitting balance - Comments: Initially needed one hand to prop at side of bed but with increased time able to support with propping. Min assist at times to reduce posterior lean.   Standing balance support: During  functional activity;Bilateral upper extremity supported Standing balance-Leahy Scale: Poor Standing balance comment: reliant on RW for steadying                           ADL either performed or assessed with clinical judgement   ADL                                                Vision Patient Visual Report: No change from baseline Vision Assessment?: No apparent visual deficits   Perception     Praxis      Cognition Arousal/Alertness: Awake/alert Behavior During Therapy: Flat affect Overall Cognitive Status: Within Functional Limits for tasks assessed                                 General Comments: Attends to television quite a bit.        Exercises Other Exercises Other Exercises: Bil Bicep Flexion w/ orange theraband 10 reps x 2 sets (between BPs), Bil shoulder extension w/ orange theraband 10 reps x 2 sets (between BPs), Bil Horizontal Abduction x 10   Shoulder Instructions       General Comments      Pertinent Vitals/ Pain       Pain Assessment: No/denies pain  Home Living                                          Prior Functioning/Environment              Frequency  Min 2X/week        Progress Toward Goals  OT Goals(current goals can now be found in the care plan section)  Progress towards OT goals: Progressing toward goals  Acute Rehab OT Goals Patient Stated Goal: Get stronger OT Goal Formulation: With patient Time For Goal Achievement: 09/25/19 Potential to Achieve Goals: [place] Discharge plan remains appropriate    Co-evaluation          OT goals addressed during session: Other (comment);Strengthening/ROM (functional mobility, activity tolerance)      AM-PAC OT "6 Clicks" Daily Activity     Outcome Measure   Help from another person eating meals?: None Help from another person taking care of personal grooming?: A Little Help from another person toileting, which includes using toliet, bedpan, or urinal?: Total Help from another person bathing (including washing, rinsing, drying)?: A Lot Help from another person to put on and taking off regular upper body clothing?: A Lot Help from another person to put on and taking off regular lower body  clothing?: A Lot 6 Click Score: 14    End of Session Equipment Utilized During Treatment: Gait belt;Rolling walker  OT Visit Diagnosis: Muscle weakness (generalized) (M62.81);Pain;History of falling (Z91.81) Pain - Right/Left:  (posterior neck, rt shoulder)   Activity Tolerance Patient limited by pain (Reports pain in neck and wants to return to supine.)   Patient Left     Nurse Communication  (BP, neck pain)        Time: [id number] OT Time Calculation (min): 28 min  Charges: OT General Charges $OT Visit: 1 Visit OT Treatments $Therapeutic Activity: 8-22 mins $Therapeutic  Exercise: 8-22 mins  Derl Barrow, OTR/L [place]  Office [phone] Pager: [place] 09/18/2019, 5:07 PM

## 2019-09-18 NOTE — Progress Notes (Signed)
PROGRESS NOTE    [name]   GYK:[id number]  DOB: 07-08-1950  DOA: 09/10/2019     8  PCP: Patient, No Pcp Per  CC: weakness, dizziness  Hospital Course: Mr. Derise is a 69 yo CM with PMH head/neck cancer (s/p surgery/chemoradiation), orthostatic hypotension, DMII, CAD (s/p CABG), CHF, colorectal adenocarcinoma (s/p cycle 7 FOLFOX 08/27/19), hx Cdiff March 2021 who presented with weakness and hypotension.  He has a known history of orthostatic hypotension and is on midodrine at home.  Secondary causes from previous work-up have also been negative.  He continues to have approximately 2-3 diarrheal bowel movements at home on a daily basis. On admission he underwent C. difficile testing and was antigen positive but toxin negative (he had been placed empirically on oral vancomycin on admission while awaiting stool specimen results). During hospitalization he also remained hypotensive and with positive orthostatics. He was restarted on IVF given his overall volume depleted state.    Interval History:  No events overnight.  Patient seen resting in bed this morning in no distress.  He did undergo repeat orthostatic blood pressure this morning which was still positive.  His IV fluids were also not on and these were then restarted.  He also became hypotensive when trying to work with physical therapy.  Old records reviewed in assessment of this patient  ROS: Constitutional: positive for fatigue, Respiratory: negative for cough, Cardiovascular: negative for chest pain and Gastrointestinal: positive for diarrhea  Assessment & Plan: Cancer of left colon ([place]) - s/p cycle 7 of FOLFOX on 08/27/19 - further management per oncology -Overall, his functional status seems to continue to decline especially in setting of worsening diarrhea that appears more likely due to chemotherapy adverse effects  Chronic diarrhea -Patient endorses that his diarrhea has been persistent and ongoing while he  has been on FOLFOX.  He does have a history of confirmed C. difficile in March 2021 and completed treatment at that time.  This hospitalization he was retested he was retested and is antigen positive but toxin negative; this is consistent with colonization and a carrier state, not active infection.  He was started empirically on oral vancomycin on admission and has completed 7 days treatment as of 09/17/2019.  Also discussed this with ID, also in agreement for stopping vancomycin in lieu of no active infection -At this point, suspect the ongoing diarrhea is in the setting of ongoing FOLFOX treatment.  Furthermore, he had no significant abdominal pain which would be expected with a C. difficile infection as well. -Patient informed of discontinuation of vancomycin and further supportive care/monitoring -Fluids restarted  Orthostatic hypotension -Patient has history of hypotension.  This may likely be in setting of ongoing chronic diarrhea and volume loss.  He also appears volume depleted on exam in general.  He remains on midodrine at home prior to admission and has had SCDs and abdominal binder attempted during hospitalization with no significant improvement -Previous echo reviewed, he has adequate ventricular function and should be able to withstand adequate volume challenge.  Patient instructed to inform staff if he feels short of breath with fluids -Start on IVF and monitor response.  Suspect this will take at least 2 to 3 days to adequately volume replete.  -If and when orthostasis improves, hopeful that patient will be able to work with physical therapy in order to pursue disposition planning as they have been unable to work with patient due to inability to stand from dizziness and hypotension - still orthostatic on  8/18, however IVF were not on during exam; IVF restarted and will continue to assess response.   Candida cystitis - urine noted with yeast; given immunocompromised state, will complete  fluconazole course   Type 2 diabetes mellitus without complication (HCC) -Continue SSI and CBG monitoring  Atrial flutter (HCC) -Continue digoxin -Not an anticoagulation candidate per previous notes   Antimicrobials: Vancomycin p.o. 09/10/2019>> 09/16/2019 Fluconazole 09/16/2019>> present  DVT prophylaxis: Lovenox Code Status: Full Family Communication: None present Disposition Plan:  Status is: Inpatient  Remains inpatient appropriate because:Hemodynamically unstable, IV treatments appropriate due to intensity of illness or inability to take PO and Inpatient level of care appropriate due to severity of illness   Dispo: The patient is from: Home              Anticipated d/c is to: Pending PT eval              Anticipated d/c date is: > 3 days              Patient currently is not medically stable to d/c.   Objective: Blood pressure 127/85, pulse 90, temperature (!) 97.4 F (36.3 C), temperature source Oral, resp. rate 18, height 6\' 2"  (1.88 m), weight 80.7 kg, SpO2 100 %.  Examination: General appearance: Chronically ill-appearing elderly man resting in bed in no distress Head: Normocephalic, without obvious abnormality, atraumatic Eyes: EOMI Lungs: clear to auscultation bilaterally Heart: regular rate and rhythm and S1, S2 normal Abdomen: normal findings: bowel sounds normal and soft, non-tender Extremities: No edema Skin: mobility and turgor normal Neurologic: Grossly normal  Data Reviewed: I have personally reviewed following labs and imaging studies Results for orders placed or performed during the hospital encounter of 09/10/19 (from the past 24 hour(s))  Glucose, capillary     Status: Abnormal   Collection Time: 09/17/19  4:19 PM  Result Value Ref Range   Glucose-Capillary 225 (H) 70 - 99 mg/dL  Glucose, capillary     Status: Abnormal   Collection Time: 09/17/19  9:42 PM  Result Value Ref Range   Glucose-Capillary 177 (H) 70 - 99 mg/dL   Comment 1 Notify RN     Comment 2 Document in Chart   Basic metabolic panel     Status: Abnormal   Collection Time: 09/18/19  3:00 AM  Result Value Ref Range   Sodium 137 135 - 145 mmol/L   Potassium 3.6 3.5 - 5.1 mmol/L   Chloride 103 98 - 111 mmol/L   CO2 26 22 - 32 mmol/L   Glucose, Bld 111 (H) 70 - 99 mg/dL   BUN 14 8 - 23 mg/dL   Creatinine, Ser 0.84 0.61 - 1.24 mg/dL   Calcium 8.1 (L) 8.9 - 10.3 mg/dL   GFR calc non Af Amer >60 >60 mL/min   GFR calc Af Amer >60 >60 mL/min   Anion gap 8 5 - 15  CBC with Differential/Platelet     Status: Abnormal   Collection Time: 09/18/19  3:00 AM  Result Value Ref Range   WBC 14.4 (H) 4.0 - 10.5 K/uL   RBC 2.87 (L) 4.22 - 5.81 MIL/uL   Hemoglobin 8.3 (L) 13.0 - 17.0 g/dL   HCT 26.4 (L) 39 - 52 %   MCV 92.0 80.0 - 100.0 fL   MCH 28.9 26.0 - 34.0 pg   MCHC 31.4 30.0 - 36.0 g/dL   RDW 16.2 (H) 11.5 - 15.5 %   Platelets 231 150 - 400 K/uL  nRBC 0.0 0.0 - 0.2 %   Neutrophils Relative % 70 %   Neutro Abs 10.0 (H) 1.7 - 7.7 K/uL   Lymphocytes Relative 10 %   Lymphs Abs 1.5 0.7 - 4.0 K/uL   Monocytes Relative 16 %   Monocytes Absolute 2.2 (H) 0 - 1 K/uL   Eosinophils Relative 2 %   Eosinophils Absolute 0.3 0 - 0 K/uL   Basophils Relative 0 %   Basophils Absolute 0.1 0 - 0 K/uL   Immature Granulocytes 2 %   Abs Immature Granulocytes 0.30 (H) 0.00 - 0.07 K/uL  Magnesium     Status: Abnormal   Collection Time: 09/18/19  3:00 AM  Result Value Ref Range   Magnesium 1.6 (L) 1.7 - 2.4 mg/dL  Glucose, capillary     Status: Abnormal   Collection Time: 09/18/19  7:32 AM  Result Value Ref Range   Glucose-Capillary 105 (H) 70 - 99 mg/dL  Glucose, capillary     Status: Abnormal   Collection Time: 09/18/19 12:43 PM  Result Value Ref Range   Glucose-Capillary 198 (H) 70 - 99 mg/dL    Recent Results (from the past 240 hour(s))  Blood culture (routine x 2)     Status: None   Collection Time: 09/10/19  1:52 PM   Specimen: BLOOD  Result Value Ref Range Status    Specimen Description   Final    BLOOD RIGHT ANTECUBITAL Performed at [facility], [place] [address] [place], [place] [address]    Special Requests   Final    BOTTLES DRAWN AEROBIC AND ANAEROBIC Blood Culture adequate volume Performed at [place] [address] [place], [place] [address]    Culture   Final    NO GROWTH 5 DAYS Performed at [facility], [place] [address], [place], [place] [address]    Report Status 09/15/2019 FINAL  Final  SARS Coronavirus 2 by RT PCR (hospital order, performed in [facility] hospital lab) Nasopharyngeal Nasopharyngeal Swab     Status: None   Collection Time: 09/10/19  3:33 PM   Specimen: Nasopharyngeal Swab  Result Value Ref Range Status   SARS Coronavirus 2 NEGATIVE NEGATIVE Final    Comment: (NOTE) SARS-CoV-2 target nucleic acids are NOT DETECTED.  The SARS-CoV-2 RNA is generally detectable in upper and lower respiratory specimens during the acute phase of infection. The lowest concentration of SARS-CoV-2 viral copies this assay can detect is 250 copies / mL. A negative result does not preclude SARS-CoV-2 infection and should not be used as the sole basis for treatment or other patient management decisions.  A negative result may occur with improper specimen collection / handling, submission of specimen other than nasopharyngeal swab, presence of viral mutation(s) within the areas targeted by this assay, and inadequate number of viral copies (<250 copies / mL). A negative result must be combined with clinical observations, patient history, and epidemiological information.  Fact Sheet for Patients:   StrictlyIdeas.no  Fact Sheet for Healthcare Providers: BankingDealers.co.za  This test is not yet approved or  cleared by the [place] FDA and has been authorized for detection and/or diagnosis of SARS-CoV-2 by FDA under an Emergency Use Authorization  (EUA).  This EUA will remain in effect (meaning this test can be used) for the duration of the COVID-19 declaration under Section 564(b)(1) of the Act, 21 U.S.C. section 360bbb-3(b)(1), unless the authorization is terminated or revoked sooner.  Performed at [facility], [place]  [address], [place], [place] [address]   Urine culture     Status: Abnormal   Collection Time: 09/10/19 10:39 PM   Specimen: Urine, Clean Catch  Result Value Ref Range Status   Specimen Description   Final    URINE, CLEAN CATCH Performed at [facility], [place] [address] [place], [place] [address]    Special Requests   Final    NONE Performed at [facility] [place] [address] [place], [place] [address]    Culture 30,000 COLONIES/mL YEAST (A)  Final   Report Status 09/11/2019 FINAL  Final  C Difficile Quick Screen w PCR reflex     Status: Abnormal   Collection Time: 09/10/19 10:39 PM   Specimen: STOOL  Result Value Ref Range Status   C Diff antigen POSITIVE (A) NEGATIVE Final   C Diff toxin NEGATIVE NEGATIVE Final   C Diff interpretation Results are indeterminate. See PCR results.  Final    Comment: Performed at [facility], [place] [address] [place], [place] [address]  Gastrointestinal Panel by PCR , Stool     Status: None   Collection Time: 09/10/19 10:39 PM   Specimen: STOOL  Result Value Ref Range Status   Campylobacter species NOT DETECTED NOT DETECTED Final   Plesimonas shigelloides NOT DETECTED NOT DETECTED Final   Salmonella species NOT DETECTED NOT DETECTED Final   Yersinia enterocolitica NOT DETECTED NOT DETECTED Final   Vibrio species NOT DETECTED NOT DETECTED Final   Vibrio cholerae NOT DETECTED NOT DETECTED Final   Enteroaggregative E coli (EAEC) NOT DETECTED NOT DETECTED Final   Enteropathogenic E coli (EPEC) NOT DETECTED NOT DETECTED Final   Enterotoxigenic E coli (ETEC) NOT DETECTED NOT DETECTED Final   Shiga like  toxin producing E coli (STEC) NOT DETECTED NOT DETECTED Final   Shigella/Enteroinvasive E coli (EIEC) NOT DETECTED NOT DETECTED Final   Cryptosporidium NOT DETECTED NOT DETECTED Final   Cyclospora cayetanensis NOT DETECTED NOT DETECTED Final   Entamoeba histolytica NOT DETECTED NOT DETECTED Final   Giardia lamblia NOT DETECTED NOT DETECTED Final   Adenovirus F40/41 NOT DETECTED NOT DETECTED Final   Astrovirus NOT DETECTED NOT DETECTED Final   Norovirus GI/GII NOT DETECTED NOT DETECTED Final   Rotavirus A NOT DETECTED NOT DETECTED Final   Sapovirus (I, II, IV, and V) NOT DETECTED NOT DETECTED Final    Comment: Performed at [facility], [place] [place], [place] [address]  C. Diff by PCR, Reflexed     Status: Abnormal   Collection Time: 09/10/19 10:39 PM  Result Value Ref Range Status   Toxigenic C. Difficile by PCR POSITIVE (A) NEGATIVE Final    Comment: Positive for toxigenic C. difficile with little to no toxin production. Only treat if clinical presentation suggests symptomatic illness. Performed at [facility], [place] [address] [place], [place] [address]   Blood culture (routine x 2)     Status: None   Collection Time: 09/11/19  4:10 AM   Specimen: BLOOD  Result Value Ref Range Status   Specimen Description   Final    BLOOD RIGHT ARM Performed at [place] [address], [place], [place] [address]    Special Requests   Final    BOTTLES DRAWN AEROBIC AND ANAEROBIC Blood Culture adequate volume Performed at [place] [address], [place], [place] [address]    Culture   Final    NO GROWTH 5 DAYS Performed  at [facility], [place] [address], [place], [place] [address]    Report Status 09/16/2019 FINAL  Final  MRSA PCR Screening     Status: None   Collection Time: 09/11/19  7:05 AM   Specimen: Nasal Mucosa; Nasopharyngeal  Result Value Ref Range Status   MRSA by PCR NEGATIVE NEGATIVE Final    Comment:         The GeneXpert MRSA Assay (FDA approved for NASAL specimens only), is one component of a comprehensive MRSA colonization surveillance program. It is not intended to diagnose MRSA infection nor to guide or monitor treatment for MRSA infections. Performed at [facility], [place] [address], [place], [place] [address]      Radiology Studies: No results found. CT ABDOMEN PELVIS W CONTRAST  Final Result    DG Chest Port 1 View  Final Result       Scheduled Meds: . Chlorhexidine Gluconate Cloth  6 each Topical Daily  . digoxin  0.25 mg Oral Daily  . enoxaparin (LOVENOX) injection  40 mg Subcutaneous Q24H  . feeding supplement (KATE FARMS STANDARD 1.4)  325 mL Oral BID BM  . fluconazole  200 mg Oral Daily  . insulin aspart  0-15 Units Subcutaneous TID WC  . midodrine  10 mg Oral TID WC  . multivitamin with minerals  1 tablet Oral Daily  . mupirocin cream   Topical Daily  . phosphorus  500 mg Oral Once  . potassium chloride  40 mEq Oral Once  . sodium chloride flush  10-40 mL Intracatheter Q12H   PRN Meds: acetaminophen **OR** acetaminophen, loperamide, ondansetron **OR** ondansetron (ZOFRAN) IV, sodium chloride flush Continuous Infusions: . sodium chloride 75 mL/hr at 09/18/19 1120      LOS: 8 days  Time spent: Greater than 50% of the 35 minute visit was spent in counseling/coordination of care for the patient as laid out in the A&P.   Dwyane Dee, MD [facility] 09/18/2019, 2:05 PM   Contact via secure chat.  To contact the attending provider between 7A-7P or the covering provider during after hours 7P-7A, please log into the web site [email or web address] and access using universal [place] password for that web site. If you do not have the password, please call the hospital operator.

## 2019-09-18 NOTE — Progress Notes (Signed)
Physical Therapy Treatment Patient Details Name: Clarence Andrews. MRN: [id number] DOB: 1951/01/24 Today's Date: 09/18/2019    History of Present Illness 69 y.o. 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 difficile colitis in March, CAD, diabetes mellitus type 2 who presented from a skilled nursing facility to undergo routine CT scan for his cancer staging and was found to be hypotensive with c/o weakness and lightheaded. Admitted with c.diff    PT Comments    +2 max assist for supine to sit. In sitting BP was 62/52, pt denied dizziness but reported feeling "weak". After sitting for 4 minutes, BP was 70/50. Performed trunk rotational stretching in sitting, and forward reaching in sitting. Performed LE strengthening exercises in supine. Pt reports he's not been able to actively dorsiflex R ankle for ~1 month.    Follow Up Recommendations  SNF     Equipment Recommendations  None recommended by PT    Recommendations for Other Services       Precautions / Restrictions Precautions Precautions: Fall Precaution Comments: Orthostatic BP Restrictions Weight Bearing Restrictions: No    Mobility  Bed Mobility Overal bed mobility: Needs Assistance       Supine to sit: Max assist;HOB elevated Sit to supine: Max assist   General bed mobility comments: max A to raise trunk with HOB up ~45*, posterior lean initially in sitting, pt denied dizziness in sitting but BP 62/52 and he reported feeling "weak", BP 70/50 after 4 minutes in sitting.  Transfers Overall transfer level: Needs assistance Equipment used: 1 person hand held assist Transfers: Lateral/Scoot Transfers          Lateral/Scoot Transfers: +2 physical assistance;Max assist General transfer comment: scooting up towards head of bed in sitting, max A to clean buttocks from bed  Ambulation/Gait             General Gait  Details: unable   Stairs             Wheelchair Mobility    Modified Rankin (Stroke Patients Only)       Balance Overall balance assessment: Needs assistance Sitting-balance support: No upper extremity supported;Feet supported;Single extremity supported Sitting balance-Leahy Scale: Poor Sitting balance - Comments: min A to supervision for posterior lean in sitting                       High Level Balance Comments: pt reached forward over BOS x 2 in sitting without loss of balance            Cognition Arousal/Alertness: Awake/alert Behavior During Therapy: WFL for tasks assessed/performed;Flat affect Overall Cognitive Status: Within Functional Limits for tasks assessed                                 General Comments: AxO x 3 loves to tell stories, familiar from prior admit June      Exercises General Exercises - Lower Extremity Ankle Circles/Pumps: AROM;Left (unable to actively dorsiflex on R (pt stated this has been the case for a month)) Quad Sets: Strengthening;Supine;Both;10 reps Heel Slides: AROM;Both;5 reps;Supine Other Exercises Other Exercises: trunk rotation AAROM to L and R x 2    General Comments        Pertinent Vitals/Pain Pain Assessment: No/denies pain    Home Living  Prior Function            PT Goals (current goals can now be found in the care plan section) Acute Rehab PT Goals Patient Stated Goal: "get CT scan" PT Goal Formulation: With patient Time For Goal Achievement: 09/25/19 Potential to Achieve Goals: Good Progress towards PT goals: Not progressing toward goals - comment (limited by orthostatic hypotension)    Frequency    Min 2X/week      PT Plan Current plan remains appropriate    Co-evaluation              AM-PAC PT "6 Clicks" Mobility   Outcome Measure  Help needed turning from your back to your side while in a flat bed without using bedrails?: A  Lot Help needed moving from lying on your back to sitting on the side of a flat bed without using bedrails?: A Lot Help needed moving to and from a bed to a chair (including a wheelchair)?: A Lot Help needed standing up from a chair using your arms (e.g., wheelchair or bedside chair)?: A Lot Help needed to walk in hospital room?: Total Help needed climbing 3-5 steps with a railing? : Total 6 Click Score: 10    End of Session Equipment Utilized During Treatment: Gait belt Activity Tolerance: Patient limited by fatigue;Treatment limited secondary to medical complications (Comment) (orthostatic in sitting) Patient left: in bed;with call bell/phone within reach;with bed alarm set;with nursing/sitter in room Nurse Communication: Mobility status PT Visit Diagnosis: Unsteadiness on feet (R26.81);Other abnormalities of gait and mobility (R26.89)     Time: 1610-9604 PT Time Calculation (min) (ACUTE ONLY): 20 min  Charges:  $Therapeutic Activity: 8-22 mins                     Blondell Reveal Kistler PT 09/18/2019  Acute Rehabilitation Services Pager [phone] Office [phone]

## 2019-09-18 NOTE — Progress Notes (Signed)
Nutrition Follow-up  DOCUMENTATION CODES:   Not applicable  INTERVENTION:  - will d/c Dillard [place] per patient request. - will trial Magic Cup BID with meals, each supplement provides 290 kcal and 9 grams of protein. - will liberalize from Heart Healthy to Regular diet.  - weigh patient today.   NUTRITION DIAGNOSIS:   Moderate Malnutrition related to chronic illness, cancer and cancer related treatments as evidenced by moderate fat depletion, moderate muscle depletion. -revised, ongoing  GOAL:   Patient will meet greater than or equal to 90% of their needs -unmet  MONITOR:   PO intake, Supplement acceptance, Labs, Weight trends  ASSESSMENT:   69 y.o. male with a medical history of colon cancer s/p 7 cycles of chemo with FOLFOX, stent placement to sigmoid colon for stricture from cancer, C. diffi in 04/2019, CAD, and type 2 DM. He presented from SNF to undergo routine CT scan for his cancer staging.  While he was in the radiology department he felt weak and lightheaded.  His blood pressure was noted to be in the 60s. He was transferred to the ED and given IV fluids. In the ED, he reported that for 1-2 weeks he has been having more diarrhea than usual; 5-6 BMs for the few days PTA. No N/V or abdominal pain.  Patient has been eating 50-75% of meals on average. For breakfast he had a yogurt and a bagel with cream cheese and for lunch he ate a sweet potato and asked RD for 2 cups of jello.   Most of the time spent in patient's room was spent providing active listening as patient recounted events and experiences throughout his life.   He is currently receiving FOLFOX for colon cancer. He also had COVID-19 in the past. He reports poor taste sensation although it is slowly coming back, especially if he eats items that are seasoned well.   He does not care for most of the food he has received so far, mainly because of lack of taste and d/t items being cold. Patient enjoys cooking, especially  grilling, at home and is hopeful to be able to do this again in the future.   He has not been weighed since 8/11. He does not like [employer] oral nutrition supplement and although he has been drinking some Ensure at times, he does not care for this supplement either.    Labs reviewed; CBGs: 105 and 198 mg/dl, Ca: 8.1 mg/dl, Mg: 1.6 mg/dl. Medications reviewed; sliding scale novolog, 800 mg mag-ox x1 dose 8/18, 1 tablet multivitamin with minerals/day, 500 mg KPhos neutral x1 dose 8/17, 40 mEq Klor-Con x1 dose 8/17.  IVF; NS @ 75 ml/hr.      NUTRITION - FOCUSED PHYSICAL EXAM:    Most Recent Value  Orbital Region Mild depletion  Upper Arm Region Moderate depletion  Thoracic and Lumbar Region Unable to assess  Buccal Region Mild depletion  Temple Region Mild depletion  Clavicle Bone Region Moderate depletion  Clavicle and Acromion Bone Region Moderate depletion  Scapular Bone Region Unable to assess  Dorsal Hand Mild depletion  Patellar Region Unable to assess  Anterior Thigh Region Unable to assess  Posterior Calf Region Unable to assess  Edema (RD Assessment) Unable to assess  Hair Reviewed  Eyes Reviewed  Mouth Other (Comment)  [poor dentition]  Skin Reviewed  Nails Reviewed       Diet Order:   Diet Order            Diet Heart Room  service appropriate? Yes; Fluid consistency: Thin  Diet effective now                 EDUCATION NEEDS:   No education needs have been identified at this time  Skin:  Skin Assessment: Skin Integrity Issues: Skin Integrity Issues:: Stage II Stage II: R ankle Other: non-pressure injury to L ankle  Last BM:  8/17 (type 4)  Height:   Ht Readings from Last 1 Encounters:  09/11/19 6\' 2"  (1.88 m)    Weight:   Wt Readings from Last 1 Encounters:  09/11/19 80.7 kg    Estimated Nutritional Needs:  Kcal:  2250-2475 kcal Protein:  100-115 grams Fluid:  >/= 2.5 L/day     Jarome Matin, MS, RD, LDN, CNSC Inpatient Clinical  Dietitian RD pager # available in [facility]  After hours/weekend pager # available in [facility]

## 2019-09-19 ENCOUNTER — Inpatient Hospital Stay: Payer: Medicare HMO

## 2019-09-19 ENCOUNTER — Encounter: Payer: Self-pay | Admitting: *Deleted

## 2019-09-19 DIAGNOSIS — K529 Noninfective gastroenteritis and colitis, unspecified: Secondary | ICD-10-CM

## 2019-09-19 LAB — CBC WITH DIFFERENTIAL/PLATELET
Abs Immature Granulocytes: 0.15 10*3/uL — ABNORMAL HIGH (ref 0.00–0.07)
Basophils Absolute: 0.1 10*3/uL (ref 0.0–0.1)
Basophils Relative: 1 %
Eosinophils Absolute: 0.2 10*3/uL (ref 0.0–0.5)
Eosinophils Relative: 2 %
HCT: 25.7 % — ABNORMAL LOW (ref 39.0–52.0)
Hemoglobin: 8.1 g/dL — ABNORMAL LOW (ref 13.0–17.0)
Immature Granulocytes: 2 %
Lymphocytes Relative: 12 %
Lymphs Abs: 1.2 10*3/uL (ref 0.7–4.0)
MCH: 29.3 pg (ref 26.0–34.0)
MCHC: 31.5 g/dL (ref 30.0–36.0)
MCV: 93.1 fL (ref 80.0–100.0)
Monocytes Absolute: 1.5 10*3/uL — ABNORMAL HIGH (ref 0.1–1.0)
Monocytes Relative: 16 %
Neutro Abs: 6.6 10*3/uL (ref 1.7–7.7)
Neutrophils Relative %: 67 %
Platelets: 213 10*3/uL (ref 150–400)
RBC: 2.76 MIL/uL — ABNORMAL LOW (ref 4.22–5.81)
RDW: 16.6 % — ABNORMAL HIGH (ref 11.5–15.5)
WBC: 9.7 10*3/uL (ref 4.0–10.5)
nRBC: 0 % (ref 0.0–0.2)

## 2019-09-19 LAB — BASIC METABOLIC PANEL
Anion gap: 10 (ref 5–15)
BUN: 13 mg/dL (ref 8–23)
CO2: 26 mmol/L (ref 22–32)
Calcium: 8.4 mg/dL — ABNORMAL LOW (ref 8.9–10.3)
Chloride: 103 mmol/L (ref 98–111)
Creatinine, Ser: 1.01 mg/dL (ref 0.61–1.24)
GFR calc Af Amer: >60 mL/min (ref >60)
GFR calc non Af Amer: >60 mL/min (ref >60)
Glucose, Bld: 155 mg/dL — ABNORMAL HIGH (ref 70–99)
Potassium: 4.2 mmol/L (ref 3.5–5.1)
Sodium: 139 mmol/L (ref 135–145)

## 2019-09-19 LAB — GLUCOSE, CAPILLARY
Glucose-Capillary: 134 mg/dL — ABNORMAL HIGH (ref 70–99)
Glucose-Capillary: 154 mg/dL — ABNORMAL HIGH (ref 70–99)
Glucose-Capillary: 100 mg/dL — ABNORMAL HIGH (ref 70–99)

## 2019-09-19 LAB — MAGNESIUM: Magnesium: 1.6 mg/dL — ABNORMAL LOW (ref 1.7–2.4)

## 2019-09-19 MED ORDER — MAGNESIUM OXIDE 400 (241.3 MG) MG PO TABS
800.0000 mg | ORAL_TABLET | Freq: Once | ORAL | Status: AC
  Administered 2019-09-19: 800 mg via ORAL
  Filled 2019-09-19: qty 2
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
## 2019-09-19 NOTE — Progress Notes (Signed)
Scheduling message sent for lab/flush/OV/FOLFOX week of 09/23/19 and to call SNF w/appointment.

## 2019-09-19 NOTE — Progress Notes (Addendum)
IP PROGRESS NOTE  Subjective:   Clarence Andrews reports no further diarrhea.  Hoping to discharge from the hospital soon.  He has no other complaints.  Objective: Vital signs in last 24 hours: Blood pressure 121/72, pulse 81, temperature 98.2 F (36.8 C), temperature source Oral, resp. rate 16, height 6' 2"  (1.88 m), weight 82.2 kg, SpO2 99 %.  Intake/Output from previous day: 08/18 0701 - 08/19 0700 In: 2371.2 [P.O.:480; I.V.:1891.2] Out: 2650 [Urine:2650]  Physical Exam:  General: Awake and alert, no distress Abdomen: Soft, nontender, nondistended Neurologic: 3-4/5 strength with dorsi flexion at the right foot  Portacath/PICC-without erythema  Lab Results: Recent Labs    09/18/19 0300 09/19/19 0440  WBC 14.4* 9.7  HGB 8.3* 8.1*  HCT 26.4* 25.7*  PLT 231 213    BMET Recent Labs    09/18/19 0300 09/19/19 0440  NA 137 139  K 3.6 4.2  CL 103 103  CO2 26 26  GLUCOSE 111* 155*  BUN 14 13  CREATININE 0.84 1.01  CALCIUM 8.1* 8.4*    Lab Results  Component Value Date   CEA1 89.63 (H) 08/27/2019    Studies/Results: No results found.  Medications: I have reviewed the patient's current medications.  Assessment/Plan:  1. 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 Colonoscopy 03/07/2019-nearly completely obstructing mass beginning at 16 cm from the anal verge, sigmoid colon polyp ? CTs 03/15/2019-wall thickening of the lower sigmoid colon, low rectal mass with significant luminal narrowing, diffuse liver metastases, borderline enlarged sigmoid mesocolon, iliac, and upper abdominal lymph nodes ? Elevated CEA ? 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 Cycle 1 FOLFOX 04/11/2019 ? Cycle 2 FOLFOX 05/28/2019 ? Cycle 3 FOLFOX 06/11/2019 ? Cycle 4 FOLFOX 06/25/2019 ? CT abdomen/pelvis  07/10/2019-severe obstructing stricture at the sigmoid colon, stable to slight improvement in hepatic metastatic disease compared to February 2021 ? Cycle 5 FOLFOX 07/31/2019 ? Cycle 6 FOLFOX 08/13/2019 ? Cycle 7 FOLFOX 08/27/2019 ? CT abdomen/pelvis 09/12/2019-stable and slightly decreased hepatic lesions, no evidence of progressive metastatic disease, slight enlargement of a previously noted pancreas head/uncinate lesion, no evidence of colonic obstruction, stable small mesenteric and retroperitoneal nodes 2. 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 Diabetes 4. Coronary artery disease, status post coronary artery bypass surgery in 2017 5. Neutropenia following cycle 1 FOLFOX 6. Admission 04/28/2019 with COVID-19 infection 7. C. difficile colitis 04/28/2019 8. Atrial flutter during hospital admission March/April 2021-treated with a Cardizem drip, digoxin, and Eliquis anticoagulation. Converted to metoprolol at discharge.  Admission with recurrent rapid atrial fibrillation/flutter 07/09/2019 9. 07/09/2019-hospital admission for bowel obstruction  Sigmoidoscopy 07/12/2019-completely obstructing mass at 17-20 cm proximal to the anus-oozing present, stent placed 10. Fall 07/24/2019 with acute kidney injury/rhabdomyolysis 11. 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 13.  Right foot drop is likely due to weight loss/nerve compression 14.  C. 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  He denies recurrent diarrhea.  Vancomycin has been stopped.  He is using Imodium as needed. The restaging abdomen CT reveals slightly improved versus stable disease.  No new site of metastatic disease.    I discussed treatment options with Clarence Andrews.  I recommend continuing FOLFOX for now.  We will  consider switching to FOLFIRI or a maintenance regimen after  several more cycles of FOLFOX.  We will recheck his CEA at his next follow-up visit as an outpatient.  Recommendations: 1.  Use Imodium or Lomotil as needed for diarrhea 2.  Physical therapy evaluation for the right foot drop 3.  We will reschedule outpatient follow-up with chemotherapy for next week   LOS: 9 days   Mikey Bussing, NP   09/19/2019, 10:41 AM Clarence Andrews was interviewed and examined.  He appears unchanged.  Diarrhea has improved.  It is unclear whether the diarrhea is related to C. difficile infection or chemotherapy.  It would be unusual to have this degree of diarrhea from the FOLFOX regimen, but this is possible.  Outpatient follow-up will be scheduled next week for the next cycle of chemotherapy.  Julieanne Manson, MD

## 2019-09-19 NOTE — Progress Notes (Signed)
PROGRESS NOTE    [name]   XBM:[id number]  DOB: 06-23-1950  DOA: 09/10/2019     9  PCP: Patient, No Pcp Per  CC: weakness, dizziness  Hospital Course: [name] is a 69 yo CM with PMH head/neck cancer (s/p surgery/chemoradiation), orthostatic hypotension, DMII, CAD (s/p CABG), CHF, colorectal adenocarcinoma (s/p cycle 7 FOLFOX 08/27/19), hx Cdiff March 2021 who presented with weakness and hypotension.  He has a known history of orthostatic hypotension and is on midodrine at home.  Secondary causes from previous work-up have also been negative.  He continues to have approximately 2-3 diarrheal bowel movements at home on a daily basis. On admission he underwent C. difficile testing and was antigen positive but toxin negative (he had been placed empirically on oral vancomycin on admission while awaiting stool specimen results). He was taken off oral vanc after 7 days treatment and after discussion with ID as well.   During hospitalization he also remained hypotensive and with positive orthostatics. He was restarted on IVF given his overall volume depleted state.   His diarrhea slowly improved especially with imodium use.    Interval History:  No events overnight.  States that his diarrhea has been slowly improving especially with use of Imodium recently.  We are now awaiting further placement and more improvement in blood pressures, especially with standing.  Old records reviewed in assessment of this patient  ROS: Constitutional: positive for fatigue, Respiratory: negative for cough, Cardiovascular: negative for chest pain and Gastrointestinal: positive for diarrhea  Assessment & Plan: Cancer of left colon ([facility]) - s/p cycle 7 of FOLFOX on 08/27/19 - further management per oncology -Overall, his functional status seems to continue to decline especially in setting of worsening diarrhea that appears more likely due to chemotherapy adverse effects  Chronic  diarrhea -Patient endorses that his diarrhea has been persistent and ongoing while he has been on FOLFOX.  He does have a history of confirmed C. difficile in March 2021 and completed treatment at that time.  This hospitalization he was retested he was retested and is antigen positive but toxin negative; this is consistent with colonization and a carrier state, not active infection.  He was started empirically on oral vancomycin on admission and has completed 7 days treatment as of 09/17/2019.  Also discussed this with ID, also in agreement for stopping vancomycin in lieu of no active infection -At this point, suspect the ongoing diarrhea is in the setting of ongoing FOLFOX treatment.  Furthermore, he had no significant abdominal pain which would be expected with a C. difficile infection as well. -Patient informed of discontinuation of vancomycin and further supportive care/monitoring -Fluids restarted -Overall diarrhea is improving especially with use of Imodium  Orthostatic hypotension -Patient has history of hypotension.  This may likely be in setting of ongoing chronic diarrhea and volume loss.  He also appears volume depleted on exam in general.  He remains on midodrine at home prior to admission and has had SCDs and abdominal binder attempted during hospitalization with no significant improvement -Previous echo reviewed, he has adequate ventricular function and should be able to withstand adequate volume challenge.  Patient instructed to inform staff if he feels short of breath with fluids -Start on IVF and monitor response.  Suspect this will take at least 2 to 3 days to adequately volume replete.  -If and when orthostasis improves, hopeful that patient will be able to work with physical therapy in order to pursue disposition planning as they have  been unable to work with patient due to inability to stand from dizziness and hypotension - still orthostatic on 8/18, however IVF were not on during  exam; IVF restarted and will continue to assess response. -Follow-up repeat orthostatic blood pressure on 09/19/2019   Candida cystitis - urine noted with yeast; given immunocompromised state, will complete fluconazole course   Type 2 diabetes mellitus without complication (HCC) -Continue SSI and CBG monitoring  Atrial flutter (HCC) -Continue digoxin -Not an anticoagulation candidate per previous notes   Antimicrobials: Vancomycin p.o. 09/10/2019>> 09/16/2019 Fluconazole 09/16/2019>> present  DVT prophylaxis: Lovenox Code Status: Full Family Communication: None present Disposition Plan:  Status is: Inpatient  Remains inpatient appropriate because:Hemodynamically unstable, IV treatments appropriate due to intensity of illness or inability to take PO and Inpatient level of care appropriate due to severity of illness   Dispo: The patient is from: Home              Anticipated d/c is to: Pending PT eval              Anticipated d/c date is: > 3 days              Patient currently is not medically stable to d/c.  Objective: Blood pressure (!) 142/78, pulse 80, temperature (!) 97.5 F (36.4 C), temperature source Oral, resp. rate 18, height 6\' 2"  (1.88 m), weight 82.2 kg, SpO2 100 %.  Examination: General appearance: Chronically ill-appearing elderly man resting in bed in no distress Head: Normocephalic, without obvious abnormality, atraumatic Eyes: EOMI Lungs: clear to auscultation bilaterally Heart: regular rate and rhythm and S1, S2 normal Abdomen: normal findings: bowel sounds normal and soft, non-tender Extremities: No edema Skin: mobility and turgor normal Neurologic: Grossly normal  Data Reviewed: I have personally reviewed following labs and imaging studies Results for orders placed or performed during the hospital encounter of 09/10/19 (from the past 24 hour(s))  Glucose, capillary     Status: Abnormal   Collection Time: 09/18/19  5:05 PM  Result Value Ref Range    Glucose-Capillary 191 (H) 70 - 99 mg/dL  Glucose, capillary     Status: Abnormal   Collection Time: 09/18/19  9:49 PM  Result Value Ref Range   Glucose-Capillary 200 (H) 70 - 99 mg/dL  Basic metabolic panel     Status: Abnormal   Collection Time: 09/19/19  4:40 AM  Result Value Ref Range   Sodium 139 135 - 145 mmol/L   Potassium 4.2 3.5 - 5.1 mmol/L   Chloride 103 98 - 111 mmol/L   CO2 26 22 - 32 mmol/L   Glucose, Bld 155 (H) 70 - 99 mg/dL   BUN 13 8 - 23 mg/dL   Creatinine, Ser 1.01 0.61 - 1.24 mg/dL   Calcium 8.4 (L) 8.9 - 10.3 mg/dL   GFR calc non Af Amer >60 >60 mL/min   GFR calc Af Amer >60 >60 mL/min   Anion gap 10 5 - 15  CBC with Differential/Platelet     Status: Abnormal   Collection Time: 09/19/19  4:40 AM  Result Value Ref Range   WBC 9.7 4.0 - 10.5 K/uL   RBC 2.76 (L) 4.22 - 5.81 MIL/uL   Hemoglobin 8.1 (L) 13.0 - 17.0 g/dL   HCT 25.7 (L) 39 - 52 %   MCV 93.1 80.0 - 100.0 fL   MCH 29.3 26.0 - 34.0 pg   MCHC 31.5 30.0 - 36.0 g/dL   RDW 16.6 (H) 11.5 - 15.5 %  Platelets 213 150 - 400 K/uL   nRBC 0.0 0.0 - 0.2 %   Neutrophils Relative % 67 %   Neutro Abs 6.6 1.7 - 7.7 K/uL   Lymphocytes Relative 12 %   Lymphs Abs 1.2 0.7 - 4.0 K/uL   Monocytes Relative 16 %   Monocytes Absolute 1.5 (H) 0 - 1 K/uL   Eosinophils Relative 2 %   Eosinophils Absolute 0.2 0 - 0 K/uL   Basophils Relative 1 %   Basophils Absolute 0.1 0 - 0 K/uL   Immature Granulocytes 2 %   Abs Immature Granulocytes 0.15 (H) 0.00 - 0.07 K/uL  Magnesium     Status: Abnormal   Collection Time: 09/19/19  4:40 AM  Result Value Ref Range   Magnesium 1.6 (L) 1.7 - 2.4 mg/dL  Glucose, capillary     Status: Abnormal   Collection Time: 09/19/19  7:53 AM  Result Value Ref Range   Glucose-Capillary 134 (H) 70 - 99 mg/dL  Glucose, capillary     Status: Abnormal   Collection Time: 09/19/19  1:08 PM  Result Value Ref Range   Glucose-Capillary 154 (H) 70 - 99 mg/dL    Recent Results (from the past 240  hour(s))  Blood culture (routine x 2)     Status: None   Collection Time: 09/10/19  1:52 PM   Specimen: BLOOD  Result Value Ref Range Status   Specimen Description   Final    BLOOD RIGHT ANTECUBITAL Performed at [facility], [place] [address], [place], [place] [address]    Special Requests   Final    BOTTLES DRAWN AEROBIC AND ANAEROBIC Blood Culture adequate volume Performed at [place] [address] [place], [place] [address]    Culture   Final    NO GROWTH 5 DAYS Performed at [facility], [place] [address], [place], [place] [address]    Report Status 09/15/2019 FINAL  Final  SARS Coronavirus 2 by RT PCR (hospital order, performed in [facility] hospital lab) Nasopharyngeal Nasopharyngeal Swab     Status: None   Collection Time: 09/10/19  3:33 PM   Specimen: Nasopharyngeal Swab  Result Value Ref Range Status   SARS Coronavirus 2 NEGATIVE NEGATIVE Final    Comment: (NOTE) SARS-CoV-2 target nucleic acids are NOT DETECTED.  The SARS-CoV-2 RNA is generally detectable in upper and lower respiratory specimens during the acute phase of infection. The lowest concentration of SARS-CoV-2 viral copies this assay can detect is 250 copies / mL. A negative result does not preclude SARS-CoV-2 infection and should not be used as the sole basis for treatment or other patient management decisions.  A negative result may occur with improper specimen collection / handling, submission of specimen other than nasopharyngeal swab, presence of viral mutation(s) within the areas targeted by this assay, and inadequate number of viral copies (<250 copies / mL). A negative result must be combined with clinical observations, patient history, and epidemiological information.  Fact Sheet for Patients:   StrictlyIdeas.no  Fact Sheet for Healthcare Providers: BankingDealers.co.za  This test is not yet approved or   cleared by the [place] FDA and has been authorized for detection and/or diagnosis of SARS-CoV-2 by FDA under an Emergency Use Authorization (EUA).  This EUA will remain in effect (meaning this test can be used) for the duration of the COVID-19 declaration under Section 564(b)(1) of the Act, 21 U.S.C. section 360bbb-3(b)(1), unless the authorization is terminated or revoked sooner.  Performed at [facility], [place] [address], [place], [place] [address]   Urine culture     Status: Abnormal   Collection Time: 09/10/19 10:39 PM   Specimen: Urine, Clean Catch  Result Value Ref Range Status   Specimen Description   Final    URINE, CLEAN CATCH Performed at [facility], [place] [address] [place], [place] [address]    Special Requests   Final    NONE Performed at [facility], [place] [address], [place], [place] [address]    Culture 30,000 COLONIES/mL YEAST (A)  Final   Report Status 09/11/2019 FINAL  Final  C Difficile Quick Screen w PCR reflex     Status: Abnormal   Collection Time: 09/10/19 10:39 PM   Specimen: STOOL  Result Value Ref Range Status   C Diff antigen POSITIVE (A) NEGATIVE Final   C Diff toxin NEGATIVE NEGATIVE Final   C Diff interpretation Results are indeterminate. See PCR results.  Final    Comment: Performed at [facility], [place] [address] [place], [place] [address]  Gastrointestinal Panel by PCR , Stool     Status: None   Collection Time: 09/10/19 10:39 PM   Specimen: STOOL  Result Value Ref Range Status   Campylobacter species NOT DETECTED NOT DETECTED Final   Plesimonas shigelloides NOT DETECTED NOT DETECTED Final   Salmonella species NOT DETECTED NOT DETECTED Final   Yersinia enterocolitica NOT DETECTED NOT DETECTED Final   Vibrio species NOT DETECTED NOT DETECTED Final   Vibrio cholerae NOT DETECTED NOT DETECTED Final   Enteroaggregative E coli (EAEC) NOT DETECTED NOT DETECTED  Final   Enteropathogenic E coli (EPEC) NOT DETECTED NOT DETECTED Final   Enterotoxigenic E coli (ETEC) NOT DETECTED NOT DETECTED Final   Shiga like toxin producing E coli (STEC) NOT DETECTED NOT DETECTED Final   Shigella/Enteroinvasive E coli (EIEC) NOT DETECTED NOT DETECTED Final   Cryptosporidium NOT DETECTED NOT DETECTED Final   Cyclospora cayetanensis NOT DETECTED NOT DETECTED Final   Entamoeba histolytica NOT DETECTED NOT DETECTED Final   Giardia lamblia NOT DETECTED NOT DETECTED Final   Adenovirus F40/41 NOT DETECTED NOT DETECTED Final   Astrovirus NOT DETECTED NOT DETECTED Final   Norovirus GI/GII NOT DETECTED NOT DETECTED Final   Rotavirus A NOT DETECTED NOT DETECTED Final   Sapovirus (I, II, IV, and V) NOT DETECTED NOT DETECTED Final    Comment: Performed at [facility], [place]., [place], [place] [address]  C. Diff by PCR, Reflexed     Status: Abnormal   Collection Time: 09/10/19 10:39 PM  Result Value Ref Range Status   Toxigenic C. Difficile by PCR POSITIVE (A) NEGATIVE Final    Comment: Positive for toxigenic C. difficile with little to no toxin production. Only treat if clinical presentation suggests symptomatic illness. Performed at [facility], [place] [address], [place], [place] [address]   Blood culture (routine x 2)     Status: None   Collection Time: 09/11/19  4:10 AM   Specimen: BLOOD  Result Value Ref Range Status   Specimen Description   Final    BLOOD RIGHT ARM Performed at [place] [address] [place], [place] [address]    Special Requests   Final    BOTTLES DRAWN AEROBIC AND ANAEROBIC Blood Culture adequate volume Performed at [place] [address] [place], [place] [address]    Culture   Final  NO GROWTH 5 DAYS Performed at [facility], [place] [address], [place], [place] [address]    Report Status 09/16/2019 FINAL  Final  MRSA PCR Screening     Status: None   Collection  Time: 09/11/19  7:05 AM   Specimen: Nasal Mucosa; Nasopharyngeal  Result Value Ref Range Status   MRSA by PCR NEGATIVE NEGATIVE Final    Comment:        The GeneXpert MRSA Assay (FDA approved for NASAL specimens only), is one component of a comprehensive MRSA colonization surveillance program. It is not intended to diagnose MRSA infection nor to guide or monitor treatment for MRSA infections. Performed at [facility], [place] [address], [place], [place] [address]      Radiology Studies: No results found. CT ABDOMEN PELVIS W CONTRAST  Final Result    DG Chest Port 1 View  Final Result       Scheduled Meds: . Chlorhexidine Gluconate Cloth  6 each Topical Daily  . digoxin  0.25 mg Oral Daily  . enoxaparin (LOVENOX) injection  40 mg Subcutaneous Q24H  . fluconazole  200 mg Oral Daily  . insulin aspart  0-15 Units Subcutaneous TID WC  . midodrine  10 mg Oral TID WC  . multivitamin with minerals  1 tablet Oral Daily  . mupirocin cream   Topical Daily  . phosphorus  500 mg Oral Once  . potassium chloride  40 mEq Oral Once  . sodium chloride flush  10-40 mL Intracatheter Q12H   PRN Meds: acetaminophen **OR** acetaminophen, loperamide, ondansetron **OR** ondansetron (ZOFRAN) IV, sodium chloride flush Continuous Infusions: . sodium chloride 75 mL/hr at 09/19/19 0039      LOS: 9 days  Time spent: Greater than 50% of the 35 minute visit was spent in counseling/coordination of care for the patient as laid out in the A&P.   Dwyane Dee, MD [facility] 09/19/2019, 1:33 PM   Contact via secure chat.  To contact the attending provider between 7A-7P or the covering provider during after hours 7P-7A, please log into the web site [email or web address] and access using universal [place] password for that web site. If you do not have the password, please call the hospital operator.

## 2019-09-19 NOTE — TOC Progression Note (Addendum)
Transition of Care (TOC) - Progression Note    Patient Details  Name: Clarence Andrews. MRN: [id number] Date of Birth: [birth year]  Transition of Care [facility]) CM/SW Contact  Ross Ludwig, [place] Phone Number: 09/19/2019, 5:40 PM  Clinical Narrative:     CSW spoke to patient and he asked if CSW can try to get him placed at [facility].  CSW contacted [facility] they reviewed information and declined patient.  CSW to follow up with patient in the morning.  Currently plan is to return to [facility] once insurance has been reapproved and patient is medically ready     Expected Discharge Plan: [place] Barriers to Discharge: Continued Medical Work up, Orthoptist and Services Expected Discharge Plan: [place] Choice: Nursing Home Living arrangements for the past 2 months: [place]                                       Social Determinants of Health (SDOH) Interventions    Readmission Risk Interventions No flowsheet data found.

## 2019-09-20 ENCOUNTER — Telehealth: Payer: Self-pay | Admitting: Oncology

## 2019-09-20 LAB — CBC WITH DIFFERENTIAL/PLATELET
Abs Immature Granulocytes: 0.15 10*3/uL — ABNORMAL HIGH (ref 0.00–0.07)
Basophils Absolute: 0.1 10*3/uL (ref 0.0–0.1)
Basophils Relative: 1 %
Eosinophils Absolute: 0.2 10*3/uL (ref 0.0–0.5)
Eosinophils Relative: 2 %
HCT: 26.9 % — ABNORMAL LOW (ref 39.0–52.0)
Hemoglobin: 8.3 g/dL — ABNORMAL LOW (ref 13.0–17.0)
Immature Granulocytes: 1 %
Lymphocytes Relative: 11 %
Lymphs Abs: 1.3 10*3/uL (ref 0.7–4.0)
MCH: 28.4 pg (ref 26.0–34.0)
MCHC: 30.9 g/dL (ref 30.0–36.0)
MCV: 92.1 fL (ref 80.0–100.0)
Monocytes Absolute: 1.5 10*3/uL — ABNORMAL HIGH (ref 0.1–1.0)
Monocytes Relative: 13 %
Neutro Abs: 8.3 10*3/uL — ABNORMAL HIGH (ref 1.7–7.7)
Neutrophils Relative %: 72 %
Platelets: 237 10*3/uL (ref 150–400)
RBC: 2.92 MIL/uL — ABNORMAL LOW (ref 4.22–5.81)
RDW: 16.5 % — ABNORMAL HIGH (ref 11.5–15.5)
WBC: 11.5 10*3/uL — ABNORMAL HIGH (ref 4.0–10.5)
nRBC: 0 % (ref 0.0–0.2)

## 2019-09-20 LAB — BASIC METABOLIC PANEL
Anion gap: 10 (ref 5–15)
BUN: 12 mg/dL (ref 8–23)
CO2: 26 mmol/L (ref 22–32)
Calcium: 8.7 mg/dL — ABNORMAL LOW (ref 8.9–10.3)
Chloride: 102 mmol/L (ref 98–111)
Creatinine, Ser: 0.84 mg/dL (ref 0.61–1.24)
GFR calc Af Amer: >60 mL/min (ref >60)
GFR calc non Af Amer: >60 mL/min (ref >60)
Glucose, Bld: 82 mg/dL (ref 70–99)
Potassium: 3.7 mmol/L (ref 3.5–5.1)
Sodium: 138 mmol/L (ref 135–145)

## 2019-09-20 LAB — MAGNESIUM: Magnesium: 1.3 mg/dL — ABNORMAL LOW (ref 1.7–2.4)

## 2019-09-20 LAB — GLUCOSE, CAPILLARY
Glucose-Capillary: 95 mg/dL (ref 70–99)
Glucose-Capillary: 169 mg/dL — ABNORMAL HIGH (ref 70–99)
Glucose-Capillary: 167 mg/dL — ABNORMAL HIGH (ref 70–99)
Glucose-Capillary: 120 mg/dL — ABNORMAL HIGH (ref 70–99)

## 2019-09-20 MED ORDER — MAGNESIUM OXIDE 400 (241.3 MG) MG PO TABS
800.0000 mg | ORAL_TABLET | Freq: Once | ORAL | Status: AC
  Administered 2019-09-20: 800 mg via ORAL
  Filled 2019-09-20: qty 2

## 2019-09-20 NOTE — Progress Notes (Signed)
Physical Therapy Treatment Patient Details Name: Clarence Andrews. MRN: [id number] DOB: 1950-06-06 Today's Date: 09/20/2019    History of Present Illness 69 y.o. 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 difficile colitis in March, CAD, diabetes mellitus type 2 who presented from a skilled nursing facility to undergo routine CT scan for his cancer staging and was found to be hypotensive with c/o weakness and lightheaded. Admitted with c.diff    PT Comments    Pt  Is pleasant and motivated. progressing however continues to be somewhat limited with mobility  d/t orthostasis. He is able to tolerate EOB,  standing and take a few lateral steps along EOB, no LOC.  See flow sheets for VS.  Continue to recommend SNF post acute    Follow Up Recommendations  SNF     Equipment Recommendations  None recommended by PT    Recommendations for Other Services       Precautions / Restrictions Precautions Precautions: Fall Precaution Comments: Orthostatic BP Required Braces or Orthoses: Other Brace Other Brace: abd binder  Restrictions Weight Bearing Restrictions: No    Mobility  Bed Mobility Overal bed mobility: Needs Assistance Bed Mobility: Rolling;Sidelying to Sit;Sit to Supine Rolling: Min assist Sidelying to sit: Min assist;Mod assist   Sit to supine: Min assist   General bed mobility comments: assist to complete roll in both directions (rolls x4 for bed pan, abd binder placement). min-mod assist to elevate trunk, light assist to bring LEs on to bed for return to supine, incr time needed for all bed mobility   Transfers Overall transfer level: Needs assistance Equipment used: Rolling walker (2 wheeled) Transfers: Sit to/from Stand           General transfer comment: cues for hand placement, assist to rise and transition to RW   Ambulation/Gait Ambulation/Gait assistance: Min  assist   Assistive device: Rolling walker (2 wheeled)       General Gait Details: lateral steps along EOB, unable to tolerate furhter activity d/t fatigue/decr BP   Stairs             Wheelchair Mobility    Modified Rankin (Stroke Patients Only)       Balance   Sitting-balance support: Single extremity supported;No upper extremity supported;Feet supported Sitting balance-Leahy Scale: Fair Sitting balance - Comments: initial min assist, cues for midline, able to maintain static sit with unilateral UE support    Standing balance support: During functional activity;Bilateral upper extremity supported Standing balance-Leahy Scale: Poor Standing balance comment: reliant on RW for steadying                            Cognition Arousal/Alertness: Awake/alert Behavior During Therapy: WFL for tasks assessed/performed Overall Cognitive Status: Within Functional Limits for tasks assessed                                        Exercises General Exercises - Lower Extremity Ankle Circles/Pumps: AROM;AAROM;Both;5 reps (unable to dorsiflex R foot d/t covid )    General Comments        Pertinent Vitals/Pain Pain Assessment: No/denies pain    Home Living                      Prior  Function            PT Goals (current goals can now be found in the care plan section) Acute Rehab PT Goals Patient Stated Goal: Get stronger PT Goal Formulation: With patient Time For Goal Achievement: 09/25/19 Potential to Achieve Goals: Good Progress towards PT goals: Progressing toward goals    Frequency    Min 2X/week      PT Plan Current plan remains appropriate    Co-evaluation              AM-PAC PT "6 Clicks" Mobility   Outcome Measure  Help needed turning from your back to your side while in a flat bed without using bedrails?: A Little Help needed moving from lying on your back to sitting on the side of a flat bed without  using bedrails?: A Lot Help needed moving to and from a bed to a chair (including a wheelchair)?: A Little Help needed standing up from a chair using your arms (e.g., wheelchair or bedside chair)?: A Little Help needed to walk in hospital room?: Total Help needed climbing 3-5 steps with a railing? : Total 6 Click Score: 13    End of Session Equipment Utilized During Treatment: Gait belt;Other (comment) (abd binder) Activity Tolerance: Treatment limited secondary to medical complications (Comment) (orthostasis ) Patient left: in bed;with call bell/phone within reach;with bed alarm set   PT Visit Diagnosis: Unsteadiness on feet (R26.81);Other abnormalities of gait and mobility (R26.89)     Time: 6222-9798 PT Time Calculation (min) (ACUTE ONLY): 28 min  Charges:  $Therapeutic Activity: 23-37 mins                     Baxter Flattery, PT  Acute Rehab Dept ([facility]) [phone] Pager [phone]  09/20/2019    [facility] 09/20/2019, 1:11 PM

## 2019-09-20 NOTE — TOC Progression Note (Signed)
Transition of Care (TOC) - Progression Note    Patient Details  Name: Clarence Andrews. MRN: [id number] Date of Birth: 08-Jun-1950  Transition of Care [facility]) CM/SW Contact  Clarence Ludwig, [place] Phone Number: 09/20/2019, 2:47 PM  Clinical Narrative:     CSW updated patient that [facility] is not able to accept patient.  CSW called [facility] and they said patient's Josem Kaufmann is still pending.  CSW sent updated PT notes to facility.   Expected Discharge Plan: [place] Barriers to Discharge: Continued Medical Work up, Ship broker  Expected Discharge Plan and Services Expected Discharge Plan: [place] Choice: Nursing Home Living arrangements for the past 2 months: [place]                                       Social Determinants of Health (SDOH) Interventions    Readmission Risk Interventions No flowsheet data found.

## 2019-09-20 NOTE — Telephone Encounter (Signed)
Scheduled appt per 8/19 sch msg - pt is aware of appts added.

## 2019-09-20 NOTE — Care Management Important Message (Signed)
Important Message  Patient Details IM Letter given to the Patient Name: Clarence Andrews. MRN: [id number] Date of Birth: 1950-09-01   Medicare Important Message Given:  Yes     Kerin Salen 09/20/2019, 10:31 AM

## 2019-09-20 NOTE — Progress Notes (Signed)
PROGRESS NOTE    Clarence Andrews.   BOF:[id number]  DOB: August 09, 1950  DOA: 09/10/2019     10  PCP: Patient, No Pcp Per  CC: weakness, dizziness  Hospital Course: [name] is a 69 yo CM with PMH head/neck cancer (s/p surgery/chemoradiation), orthostatic hypotension, DMII, CAD (s/p CABG), CHF, colorectal adenocarcinoma (s/p cycle 7 FOLFOX 08/27/19), hx Cdiff March 2021 who presented with weakness and hypotension.  He has a known history of orthostatic hypotension and is on midodrine at home.  Secondary causes from previous work-up have also been negative.  He continues to have approximately 2-3 diarrheal bowel movements at home on a daily basis. On admission he underwent C. difficile testing and was antigen positive but toxin negative (he had been placed empirically on oral vancomycin on admission while awaiting stool specimen results). He was taken off oral vanc after 7 days treatment and after discussion with ID as well.   During hospitalization he also remained hypotensive and with positive orthostatics. He was restarted on IVF given his overall volume depleted state.   His diarrhea slowly improved especially with imodium use.    Interval History:  No events overnight.  Remains asymptomatic when standing with staff.  Understands plan is awaiting insurance authorization for returning to his SNF.  Old records reviewed in assessment of this patient  ROS: Constitutional: positive for fatigue, Respiratory: negative for cough, Cardiovascular: negative for chest pain and Gastrointestinal: positive for diarrhea  Assessment & Plan: Cancer of left colon ([place]) - s/p cycle 7 of FOLFOX on 08/27/19 - further management per oncology -Overall, his functional status seems to continue to decline especially in setting of worsening diarrhea that appears more likely due to chemotherapy adverse effects  Chronic diarrhea -Patient endorses that his diarrhea has been persistent and ongoing while he  has been on FOLFOX.  He does have a history of confirmed C. difficile in March 2021 and completed treatment at that time.  This hospitalization he was retested he was retested and is antigen positive but toxin negative; this is consistent with colonization and a carrier state, not active infection.  He was started empirically on oral vancomycin on admission and has completed 7 days treatment as of 09/17/2019.  Also discussed this with ID, also in agreement for stopping vancomycin in lieu of no active infection -At this point, suspect the ongoing diarrhea is in the setting of ongoing FOLFOX treatment.  Furthermore, he had no significant abdominal pain which would be expected with a C. difficile infection as well. -Patient informed of discontinuation of vancomycin and further supportive care/monitoring -Fluids restarted -Overall diarrhea is improving especially with use of Imodium  Orthostatic hypotension -Patient has history of hypotension.  This may likely be in setting of ongoing chronic diarrhea and volume loss.  He also appears volume depleted on exam in general.  He remains on midodrine at home prior to admission and has had SCDs and abdominal binder attempted during hospitalization with no significant improvement -Previous echo reviewed, he has adequate ventricular function and should be able to withstand adequate volume challenge.  Patient instructed to inform staff if he feels short of breath with fluids -Start on IVF and monitor response.  Suspect this will take at least 2 to 3 days to adequately volume replete.  -If and when orthostasis improves, hopeful that patient will be able to work with physical therapy in order to pursue disposition planning as they have been unable to work with patient due to inability to stand from  dizziness and hypotension - still orthostatic on 8/18, however IVF were not on during exam; IVF restarted and will continue to assess response. -appears persistently  orthostatic positive however is asymptomatic; educated patient on positional change techniques to include sitting up for a few minutes from a laying position and then upon standing to also stand with assistance and wait a few minutes prior to walking as well; continue TED hose and abdominal binder when ambulating   Candida cystitis - urine noted with yeast; given immunocompromised state, will complete fluconazole course   Type 2 diabetes mellitus without complication ([place]) -Continue SSI and CBG monitoring  Atrial flutter ([facility]) -Continue digoxin -Not an anticoagulation candidate per previous notes   Antimicrobials: Vancomycin p.o. 09/10/2019>> 09/16/2019 Fluconazole 09/16/2019>> present  DVT prophylaxis: Lovenox Code Status: Full Family Communication: None present Disposition Plan:  Status is: Inpatient  Remains inpatient appropriate because:Hemodynamically unstable, IV treatments appropriate due to intensity of illness or inability to take PO and Inpatient level of care appropriate due to severity of illness   Dispo: The patient is from: Home              Anticipated d/c is to: SNF              Anticipated d/c date is: 2 days              Patient currently is not medically stable to d/c.  Objective: Blood pressure 121/65, pulse 76, temperature 98.1 F (36.7 C), resp. rate 16, height 6\' 2"  (1.88 m), weight 82.2 kg, SpO2 100 %.  Examination: General appearance: Chronically ill-appearing elderly man resting in bed in no distress Head: Normocephalic, without obvious abnormality, atraumatic Eyes: EOMI Lungs: clear to auscultation bilaterally Heart: regular rate and rhythm and S1, S2 normal Abdomen: normal findings: bowel sounds normal and soft, non-tender Extremities: No edema Skin: mobility and turgor normal Neurologic: Grossly normal  Data Reviewed: I have personally reviewed following labs and imaging studies Results for orders placed or performed during the hospital  encounter of 09/10/19 (from the past 24 hour(s))  Glucose, capillary     Status: Abnormal   Collection Time: 09/19/19  4:20 PM  Result Value Ref Range   Glucose-Capillary 100 (H) 70 - 99 mg/dL  Basic metabolic panel     Status: Abnormal   Collection Time: 09/20/19  4:33 AM  Result Value Ref Range   Sodium 138 135 - 145 mmol/L   Potassium 3.7 3.5 - 5.1 mmol/L   Chloride 102 98 - 111 mmol/L   CO2 26 22 - 32 mmol/L   Glucose, Bld 82 70 - 99 mg/dL   BUN 12 8 - 23 mg/dL   Creatinine, Ser 0.84 0.61 - 1.24 mg/dL   Calcium 8.7 (L) 8.9 - 10.3 mg/dL   GFR calc non Af Amer >60 >60 mL/min   GFR calc Af Amer >60 >60 mL/min   Anion gap 10 5 - 15  CBC with Differential/Platelet     Status: Abnormal   Collection Time: 09/20/19  4:33 AM  Result Value Ref Range   WBC 11.5 (H) 4.0 - 10.5 K/uL   RBC 2.92 (L) 4.22 - 5.81 MIL/uL   Hemoglobin 8.3 (L) 13.0 - 17.0 g/dL   HCT 26.9 (L) 39 - 52 %   MCV 92.1 80.0 - 100.0 fL   MCH 28.4 26.0 - 34.0 pg   MCHC 30.9 30.0 - 36.0 g/dL   RDW 16.5 (H) 11.5 - 15.5 %   Platelets 237 150 -  400 K/uL   nRBC 0.0 0.0 - 0.2 %   Neutrophils Relative % 72 %   Neutro Abs 8.3 (H) 1.7 - 7.7 K/uL   Lymphocytes Relative 11 %   Lymphs Abs 1.3 0.7 - 4.0 K/uL   Monocytes Relative 13 %   Monocytes Absolute 1.5 (H) 0 - 1 K/uL   Eosinophils Relative 2 %   Eosinophils Absolute 0.2 0 - 0 K/uL   Basophils Relative 1 %   Basophils Absolute 0.1 0 - 0 K/uL   Immature Granulocytes 1 %   Abs Immature Granulocytes 0.15 (H) 0.00 - 0.07 K/uL  Magnesium     Status: Abnormal   Collection Time: 09/20/19  4:33 AM  Result Value Ref Range   Magnesium 1.3 (L) 1.7 - 2.4 mg/dL  Glucose, capillary     Status: None   Collection Time: 09/20/19  7:52 AM  Result Value Ref Range   Glucose-Capillary 95 70 - 99 mg/dL  Glucose, capillary     Status: Abnormal   Collection Time: 09/20/19 11:40 AM  Result Value Ref Range   Glucose-Capillary 169 (H) 70 - 99 mg/dL    Recent Results (from the past 240  hour(s))  SARS Coronavirus 2 by RT PCR (hospital order, performed in [facility] hospital lab) Nasopharyngeal Nasopharyngeal Swab     Status: None   Collection Time: 09/10/19  3:33 PM   Specimen: Nasopharyngeal Swab  Result Value Ref Range Status   SARS Coronavirus 2 NEGATIVE NEGATIVE Final    Comment: (NOTE) SARS-CoV-2 target nucleic acids are NOT DETECTED.  The SARS-CoV-2 RNA is generally detectable in upper and lower respiratory specimens during the acute phase of infection. The lowest concentration of SARS-CoV-2 viral copies this assay can detect is 250 copies / mL. A negative result does not preclude SARS-CoV-2 infection and should not be used as the sole basis for treatment or other patient management decisions.  A negative result may occur with improper specimen collection / handling, submission of specimen other than nasopharyngeal swab, presence of viral mutation(s) within the areas targeted by this assay, and inadequate number of viral copies (<250 copies / mL). A negative result must be combined with clinical observations, patient history, and epidemiological information.  Fact Sheet for Patients:   StrictlyIdeas.no  Fact Sheet for Healthcare Providers: BankingDealers.co.za  This test is not yet approved or  cleared by the [place] FDA and has been authorized for detection and/or diagnosis of SARS-CoV-2 by FDA under an Emergency Use Authorization (EUA).  This EUA will remain in effect (meaning this test can be used) for the duration of the COVID-19 declaration under Section 564(b)(1) of the Act, 21 U.S.C. section 360bbb-3(b)(1), unless the authorization is terminated or revoked sooner.  Performed at [facility], [place] [address] [place], [place] [address]   Urine culture     Status: Abnormal   Collection Time: 09/10/19 10:39 PM   Specimen: Urine, Clean Catch  Result Value Ref Range Status    Specimen Description   Final    URINE, CLEAN CATCH Performed at [facility], [place] [address] [place], [place] [address]    Special Requests   Final    NONE Performed at [facility], [place] [address], [place], [place] [address]    Culture 30,000 COLONIES/mL YEAST (A)  Final   Report Status 09/11/2019 FINAL  Final  C Difficile Quick Screen w PCR reflex     Status: Abnormal   Collection Time: 09/10/19  10:39 PM   Specimen: STOOL  Result Value Ref Range Status   C Diff antigen POSITIVE (A) NEGATIVE Final   C Diff toxin NEGATIVE NEGATIVE Final   C Diff interpretation Results are indeterminate. See PCR results.  Final    Comment: Performed at [facility], [place] [address], [place], [place] [address]  Gastrointestinal Panel by PCR , Stool     Status: None   Collection Time: 09/10/19 10:39 PM   Specimen: STOOL  Result Value Ref Range Status   Campylobacter species NOT DETECTED NOT DETECTED Final   Plesimonas shigelloides NOT DETECTED NOT DETECTED Final   Salmonella species NOT DETECTED NOT DETECTED Final   Yersinia enterocolitica NOT DETECTED NOT DETECTED Final   Vibrio species NOT DETECTED NOT DETECTED Final   Vibrio cholerae NOT DETECTED NOT DETECTED Final   Enteroaggregative E coli (EAEC) NOT DETECTED NOT DETECTED Final   Enteropathogenic E coli (EPEC) NOT DETECTED NOT DETECTED Final   Enterotoxigenic E coli (ETEC) NOT DETECTED NOT DETECTED Final   Shiga like toxin producing E coli (STEC) NOT DETECTED NOT DETECTED Final   Shigella/Enteroinvasive E coli (EIEC) NOT DETECTED NOT DETECTED Final   Cryptosporidium NOT DETECTED NOT DETECTED Final   Cyclospora cayetanensis NOT DETECTED NOT DETECTED Final   Entamoeba histolytica NOT DETECTED NOT DETECTED Final   Giardia lamblia NOT DETECTED NOT DETECTED Final   Adenovirus F40/41 NOT DETECTED NOT DETECTED Final   Astrovirus NOT DETECTED NOT DETECTED Final   Norovirus GI/GII NOT DETECTED  NOT DETECTED Final   Rotavirus A NOT DETECTED NOT DETECTED Final   Sapovirus (I, II, IV, and V) NOT DETECTED NOT DETECTED Final    Comment: Performed at [facility], [address] [place], [place] [address]  C. Diff by PCR, Reflexed     Status: Abnormal   Collection Time: 09/10/19 10:39 PM  Result Value Ref Range Status   Toxigenic C. Difficile by PCR POSITIVE (A) NEGATIVE Final    Comment: Positive for toxigenic C. difficile with little to no toxin production. Only treat if clinical presentation suggests symptomatic illness. Performed at [facility], [place] [address], [place], [place] [address]   Blood culture (routine x 2)     Status: None   Collection Time: 09/11/19  4:10 AM   Specimen: BLOOD  Result Value Ref Range Status   Specimen Description   Final    BLOOD RIGHT ARM Performed at [place] [address], [place], [place] [address]    Special Requests   Final    BOTTLES DRAWN AEROBIC AND ANAEROBIC Blood Culture adequate volume Performed at [place] [address] [place], [place] [address]    Culture   Final    NO GROWTH 5 DAYS Performed at [facility], [place] [address], [place], [place] [address]    Report Status 09/16/2019 FINAL  Final  MRSA PCR Screening     Status: None   Collection Time: 09/11/19  7:05 AM   Specimen: Nasal Mucosa; Nasopharyngeal  Result Value Ref Range Status   MRSA by PCR NEGATIVE NEGATIVE Final    Comment:        The GeneXpert MRSA Assay (FDA approved for NASAL specimens only), is one component of a comprehensive MRSA colonization surveillance program. It is not intended to diagnose MRSA infection nor to guide or monitor treatment for MRSA infections. Performed at [facility], [place] [address], [place], [place] [address]  Radiology Studies: No results found. CT ABDOMEN PELVIS W CONTRAST  Final Result    DG Chest Port 1 View  Final Result         Scheduled Meds: . Chlorhexidine Gluconate Cloth  6 each Topical Daily  . digoxin  0.25 mg Oral Daily  . enoxaparin (LOVENOX) injection  40 mg Subcutaneous Q24H  . fluconazole  200 mg Oral Daily  . insulin aspart  0-15 Units Subcutaneous TID WC  . midodrine  10 mg Oral TID WC  . multivitamin with minerals  1 tablet Oral Daily  . mupirocin cream   Topical Daily  . phosphorus  500 mg Oral Once  . potassium chloride  40 mEq Oral Once  . sodium chloride flush  10-40 mL Intracatheter Q12H   PRN Meds: acetaminophen **OR** acetaminophen, loperamide, ondansetron **OR** ondansetron (ZOFRAN) IV, sodium chloride flush Continuous Infusions: . sodium chloride 75 mL/hr at 09/19/19 0039      LOS: 10 days  Time spent: Greater than 50% of the 35 minute visit was spent in counseling/coordination of care for the patient as laid out in the A&P.   Dwyane Dee, MD [facility] 09/20/2019, 3:15 PM   Contact via secure chat.  To contact the attending provider between 7A-7P or the covering provider during after hours 7P-7A, please log into the web site [email or web address] and access using universal [place] password for that web site. If you do not have the password, please call the hospital operator.

## 2019-09-21 ENCOUNTER — Inpatient Hospital Stay: Payer: Medicare HMO

## 2019-09-21 DIAGNOSIS — K529 Noninfective gastroenteritis and colitis, unspecified: Secondary | ICD-10-CM | POA: Diagnosis not present

## 2019-09-21 DIAGNOSIS — R197 Diarrhea, unspecified: Secondary | ICD-10-CM | POA: Diagnosis not present

## 2019-09-21 DIAGNOSIS — D010 Carcinoma in situ of colon: Secondary | ICD-10-CM | POA: Diagnosis not present

## 2019-09-21 DIAGNOSIS — M2550 Pain in unspecified joint: Secondary | ICD-10-CM | POA: Diagnosis not present

## 2019-09-21 DIAGNOSIS — R5381 Other malaise: Secondary | ICD-10-CM | POA: Diagnosis not present

## 2019-09-21 DIAGNOSIS — C186 Malignant neoplasm of descending colon: Secondary | ICD-10-CM | POA: Diagnosis not present

## 2019-09-21 DIAGNOSIS — B3741 Candidal cystitis and urethritis: Secondary | ICD-10-CM | POA: Diagnosis not present

## 2019-09-21 DIAGNOSIS — E46 Unspecified protein-calorie malnutrition: Secondary | ICD-10-CM | POA: Diagnosis not present

## 2019-09-21 DIAGNOSIS — L89151 Pressure ulcer of sacral region, stage 1: Secondary | ICD-10-CM | POA: Diagnosis present

## 2019-09-21 DIAGNOSIS — Z7401 Bed confinement status: Secondary | ICD-10-CM | POA: Diagnosis not present

## 2019-09-21 DIAGNOSIS — Z1159 Encounter for screening for other viral diseases: Secondary | ICD-10-CM | POA: Diagnosis not present

## 2019-09-21 DIAGNOSIS — I482 Chronic atrial fibrillation, unspecified: Secondary | ICD-10-CM | POA: Diagnosis not present

## 2019-09-21 DIAGNOSIS — I479 Paroxysmal tachycardia, unspecified: Secondary | ICD-10-CM | POA: Diagnosis not present

## 2019-09-21 DIAGNOSIS — Z85810 Personal history of malignant neoplasm of tongue: Secondary | ICD-10-CM | POA: Diagnosis not present

## 2019-09-21 DIAGNOSIS — Z79899 Other long term (current) drug therapy: Secondary | ICD-10-CM | POA: Diagnosis not present

## 2019-09-21 DIAGNOSIS — I951 Orthostatic hypotension: Secondary | ICD-10-CM | POA: Diagnosis not present

## 2019-09-21 DIAGNOSIS — E119 Type 2 diabetes mellitus without complications: Secondary | ICD-10-CM | POA: Diagnosis not present

## 2019-09-21 DIAGNOSIS — C187 Malignant neoplasm of sigmoid colon: Secondary | ICD-10-CM | POA: Diagnosis not present

## 2019-09-21 DIAGNOSIS — C787 Secondary malignant neoplasm of liver and intrahepatic bile duct: Secondary | ICD-10-CM | POA: Diagnosis not present

## 2019-09-21 DIAGNOSIS — I5022 Chronic systolic (congestive) heart failure: Secondary | ICD-10-CM | POA: Diagnosis not present

## 2019-09-21 DIAGNOSIS — Z923 Personal history of irradiation: Secondary | ICD-10-CM | POA: Diagnosis not present

## 2019-09-21 DIAGNOSIS — L03116 Cellulitis of left lower limb: Secondary | ICD-10-CM | POA: Diagnosis not present

## 2019-09-21 DIAGNOSIS — C19 Malignant neoplasm of rectosigmoid junction: Secondary | ICD-10-CM | POA: Diagnosis not present

## 2019-09-21 DIAGNOSIS — Z5111 Encounter for antineoplastic chemotherapy: Secondary | ICD-10-CM | POA: Diagnosis not present

## 2019-09-21 DIAGNOSIS — I483 Typical atrial flutter: Secondary | ICD-10-CM | POA: Diagnosis not present

## 2019-09-21 DIAGNOSIS — Z8616 Personal history of COVID-19: Secondary | ICD-10-CM | POA: Diagnosis not present

## 2019-09-21 DIAGNOSIS — Z9221 Personal history of antineoplastic chemotherapy: Secondary | ICD-10-CM | POA: Diagnosis not present

## 2019-09-21 DIAGNOSIS — D649 Anemia, unspecified: Secondary | ICD-10-CM | POA: Diagnosis not present

## 2019-09-21 DIAGNOSIS — A0472 Enterocolitis due to Clostridium difficile, not specified as recurrent: Secondary | ICD-10-CM | POA: Diagnosis not present

## 2019-09-21 LAB — BASIC METABOLIC PANEL
Anion gap: 10 (ref 5–15)
BUN: 15 mg/dL (ref 8–23)
CO2: 27 mmol/L (ref 22–32)
Calcium: 8.7 mg/dL — ABNORMAL LOW (ref 8.9–10.3)
Chloride: 103 mmol/L (ref 98–111)
Creatinine, Ser: 0.82 mg/dL (ref 0.61–1.24)
GFR calc Af Amer: >60 mL/min (ref >60)
GFR calc non Af Amer: >60 mL/min (ref >60)
Glucose, Bld: 120 mg/dL — ABNORMAL HIGH (ref 70–99)
Potassium: 3.9 mmol/L (ref 3.5–5.1)
Sodium: 140 mmol/L (ref 135–145)

## 2019-09-21 LAB — CBC WITH DIFFERENTIAL/PLATELET
Abs Immature Granulocytes: 0.11 10*3/uL — ABNORMAL HIGH (ref 0.00–0.07)
Basophils Absolute: 0.1 10*3/uL (ref 0.0–0.1)
Basophils Relative: 1 %
Eosinophils Absolute: 0.2 10*3/uL (ref 0.0–0.5)
Eosinophils Relative: 2 %
HCT: 26.4 % — ABNORMAL LOW (ref 39.0–52.0)
Hemoglobin: 8.2 g/dL — ABNORMAL LOW (ref 13.0–17.0)
Immature Granulocytes: 1 %
Lymphocytes Relative: 12 %
Lymphs Abs: 1.1 10*3/uL (ref 0.7–4.0)
MCH: 29 pg (ref 26.0–34.0)
MCHC: 31.1 g/dL (ref 30.0–36.0)
MCV: 93.3 fL (ref 80.0–100.0)
Monocytes Absolute: 1.3 10*3/uL — ABNORMAL HIGH (ref 0.1–1.0)
Monocytes Relative: 13 %
Neutro Abs: 6.9 10*3/uL (ref 1.7–7.7)
Neutrophils Relative %: 71 %
Platelets: 237 10*3/uL (ref 150–400)
RBC: 2.83 MIL/uL — ABNORMAL LOW (ref 4.22–5.81)
RDW: 16.5 % — ABNORMAL HIGH (ref 11.5–15.5)
WBC: 9.7 10*3/uL (ref 4.0–10.5)
nRBC: 0 % (ref 0.0–0.2)

## 2019-09-21 LAB — SARS CORONAVIRUS 2 BY RT PCR (HOSPITAL ORDER, PERFORMED IN ~~LOC~~ HOSPITAL LAB): SARS Coronavirus 2: NEGATIVE

## 2019-09-21 LAB — GLUCOSE, CAPILLARY
Glucose-Capillary: 117 mg/dL — ABNORMAL HIGH (ref 70–99)
Glucose-Capillary: 148 mg/dL — ABNORMAL HIGH (ref 70–99)
Glucose-Capillary: 177 mg/dL — ABNORMAL HIGH (ref 70–99)

## 2019-09-21 LAB — MAGNESIUM: Magnesium: 1.5 mg/dL — ABNORMAL LOW (ref 1.7–2.4)

## 2019-09-21 MED ORDER — MAGNESIUM OXIDE 400 (241.3 MG) MG PO TABS
800.0000 mg | ORAL_TABLET | Freq: Once | ORAL | Status: AC
  Administered 2019-09-21: 800 mg via ORAL
  Filled 2019-09-21: qty 2

## 2019-09-21 MED ORDER — FLUCONAZOLE 200 MG PO TABS: 200.0000 mg | ORAL_TABLET | Freq: Every day | ORAL | 0 refills | Status: AC

## 2019-09-21 MED ORDER — ADULT MULTIVITAMIN W/MINERALS CH: 1.0000 | ORAL_TABLET | Freq: Every day | ORAL | Status: DC

## 2019-09-21 MED ORDER — HEPARIN SOD (PORK) LOCK FLUSH 100 UNIT/ML IV SOLN
500.0000 [IU] | INTRAVENOUS | Status: AC | PRN
  Administered 2019-09-21: 500 [IU]

## 2019-09-21 MED ORDER — MIDODRINE HCL 10 MG PO TABS: 10.0000 mg | ORAL_TABLET | Freq: Three times a day (TID) | ORAL | Status: DC

## 2019-09-21 NOTE — TOC Transition Note (Signed)
Transition of Care [facility]) - CM/SW Discharge Note   Patient Details  Name: Clarence Andrews. MRN: [id number] Date of Birth: November 16, 1950  [facility] CM/SW Contact:  Ross Ludwig, LCSW Phone Number: 09/21/2019, 2:10 PM   Clinical Narrative:     Patient to be d/c'ed today to [facility] room 208.  Patient and family agreeable to plans will transport via ems RN to call report to [place], [phone].  Patient to notify family that he is discharging today.  Final next level of care: Skilled Nursing Facility Barriers to Discharge: Barriers Resolved   Patient Goals and CMS Choice Patient states their goals for this hospitalization and ongoing recovery are:: To go to SNF to continue with rehab. CMS Medicare.gov Compare Post Acute Care list provided to:: Patient Choice offered to / list presented to : Patient  Discharge Placement   Existing PASRR number confirmed : 09/17/19            Patient to be transferred to facility by: [facility] Name of family member notified: Patient to notify his family Patient and family notified of of transfer: 09/21/19  Discharge Plan and Services     Post Acute Care Choice: Nursing Home            DME Agency: NA                  Social Determinants of Health ([place]) Interventions     Readmission Risk Interventions No flowsheet data found.

## 2019-09-21 NOTE — Plan of Care (Signed)
  Problem: Pain Managment: Goal: General experience of comfort will improve Outcome: Progressing   

## 2019-09-21 NOTE — Discharge Summary (Signed)
Physician Discharge Summary  Clarence Andrews. JEH:[id number] DOB: May 29, 1950 DOA: 09/10/2019  PCP: Patient, No Pcp Per  Admit date: 09/10/2019 Discharge date: 09/21/2019  Admitted From: rehab Disposition:  Rehab/SNF Discharging physician: Dwyane Dee, MD  Recommendations for Outpatient Follow-up:  1. Continue slow transitions from laying to standing due to orthostatic hypotension; patient remained asymptomatic 2. Continue care with oncology 3. Continue imodium PRN for diarrhea 4.    Patient discharged to SNF in Discharge Condition: fair CODE STATUS: Full Diet recommendation:  Diet Orders (From admission, onward)    Start     Ordered   09/21/19 0000  Diet - low sodium heart healthy        09/21/19 1222   09/18/19 1531  Diet regular Room service appropriate? Yes; Fluid consistency: Thin  Diet effective now       Question Answer Comment  Room service appropriate? Yes   Fluid consistency: Thin      09/18/19 1530          Hospital Course: Clarence Andrews is a 69 yo CM with PMH head/neck cancer (s/p surgery/chemoradiation), orthostatic hypotension, DMII, CAD (s/p CABG), CHF, colorectal adenocarcinoma (s/p cycle 7 FOLFOX 08/27/19), hx Cdiff March 2021 who presented with weakness and hypotension.  He has a known history of orthostatic hypotension and is on midodrine at home.  Secondary causes from previous work-up have also been negative.  He continues to have approximately 2-3 diarrheal bowel movements at home on a daily basis. On admission he underwent C. difficile testing and was antigen positive but toxin negative (he had been placed empirically on oral vancomycin on admission while awaiting stool specimen results). He was taken off oral vanc after 7 days treatment and after discussion with ID as well.   During hospitalization he also remained hypotensive and with positive orthostatics. He was restarted on IVF given his overall volume depleted state.  His blood pressure supine  improved.  He was also continued on midodrine.  He did continue to have orthostatic hypotension upon standing however was asymptomatic.  He was recommended to continue slow change in positions when going from lying to standing.  His diarrhea also slowly improved especially with imodium use.  He worked with physical therapy during hospitalization and was recommended for ongoing rehab at discharge.  He was also followed by oncology during hospitalization and will continue on treatment at discharge.   Cancer of left colon ([place]) - s/p cycle 7 of FOLFOX on 08/27/19 - further management per oncology -Overall, his functional status seems to continue to decline especially in setting of worsening diarrhea that appears more likely due to chemotherapy adverse effects - outpatient followup for continuing treatment   Chronic diarrhea -Patient endorses that his diarrhea has been persistent and ongoing while he has been on FOLFOX.  He does have a history of confirmed C. difficile in March 2021 and completed treatment at that time.  This hospitalization he was retested he was retested and is antigen positive but toxin negative; this is consistent with colonization and a carrier state, not active infection.  He was started empirically on oral vancomycin on admission and has completed 7 days treatment as of 09/17/2019.  Also discussed this with ID, also in agreement for stopping vancomycin in lieu of no active infection -At this point, suspect the ongoing diarrhea is in the setting of ongoing FOLFOX treatment.  Furthermore, he had no significant abdominal pain which would be expected with a C. difficile infection as well. -Patient informed of discontinuation  of vancomycin and further supportive care/monitoring -Overall diarrhea is improving especially with use of Imodium  Orthostatic hypotension -Patient has history of hypotension.  This may likely be in setting of ongoing chronic diarrhea and volume loss.  He also  appears volume depleted on exam in general.  He remains on midodrine at home prior to admission and has had SCDs and abdominal binder attempted during hospitalization with no significant improvement -Previous echo reviewed, he has adequate ventricular function and should be able to withstand adequate volume challenge.  Patient instructed to inform staff if he feels short of breath with fluids -Start on IVF and monitor response.  Suspect this will take at least 2 to 3 days to adequately volume replete.  -If and when orthostasis improves, hopeful that patient will be able to work with physical therapy in order to pursue disposition planning as they have been unable to work with patient due to inability to stand from dizziness and hypotension - still orthostatic on 8/18, however IVF were not on during exam; IVF restarted and will continue to assess response. -appears persistently orthostatic positive however is asymptomatic; educated patient on positional change techniques to include sitting up for a few minutes from a laying position and then upon standing to also stand with assistance and wait a few minutes prior to walking as well; continue TED hose and abdominal binder when ambulating   Candida cystitis - urine noted with yeast; given immunocompromised state, will complete fluconazole course x 14 days  Type 2 diabetes mellitus without complication (HCC) -Continue SSI and CBG monitoring  Atrial flutter (HCC) -Continue digoxin -Not an anticoagulation candidate per previous notes  Pressure ulcer of sacral region, stage 1 - continue daily wound care; turning in bed frequently   Discharge Diagnoses:   Principal Diagnosis: Chronic diarrhea  Active Hospital Problems   Diagnosis Date Noted  . Chronic diarrhea 09/10/2019    Priority: High  . Cancer of left colon ([place]) 04/02/2019    Priority: High  . Orthostatic hypotension 07/25/2019    Priority: Medium  . Pressure ulcer of sacral region,  stage 1 09/21/2019  . Candida cystitis 09/17/2019  . Atrial flutter ([place])   . Type 2 diabetes mellitus without complication ([place]) 01/75/1025    Resolved Hospital Problems  No resolved problems to display.    Discharge Instructions    Diet - low sodium heart healthy   Complete by: As directed    Discharge wound care:   Complete by: As directed    Apply Bactroban to left and right outer ankles and right lower extremity wounds daily and cover with foam dressings.  Change foam dressings every 3 days or as needed if heavily soiled   Increase activity slowly   Complete by: As directed      Allergies as of 09/21/2019   No Known Allergies     Medication List    STOP taking these medications   lidocaine-prilocaine cream Commonly known as: EMLA   metoprolol tartrate 100 MG tablet Commonly known as: LOPRESSOR   polyethylene glycol 17 g packet Commonly known as: MIRALAX / GLYCOLAX   senna 8.6 MG Tabs tablet Commonly known as: SENOKOT   senna-docusate 8.6-50 MG tablet Commonly known as: Senokot-S     TAKE these medications   ascorbic acid 500 MG tablet Commonly known as: VITAMIN C Take 1 tablet (500 mg total) by mouth daily.   Chromium-Cinnamon [phone] MCG-MG Caps Take 2,000 mg by mouth daily.   D3-1000 PO Take 1,000 Units  by mouth daily.   digoxin 0.25 MG tablet Commonly known as: LANOXIN Take 1 tablet (0.25 mg total) by mouth daily.   ferrous sulfate 325 (65 FE) MG EC tablet Take 325 mg by mouth daily with breakfast.   fluconazole 200 MG tablet Commonly known as: DIFLUCAN Take 1 tablet (200 mg total) by mouth daily for 7 days. Start taking on: September 22, 2019   glipiZIDE 10 MG tablet Commonly known as: GLUCOTROL Take 10 mg by mouth daily before breakfast.   levalbuterol 45 MCG/ACT inhaler Commonly known as: XOPENEX HFA Inhale 2 puffs into the lungs every 6 (six) hours as needed for wheezing.   loperamide 2 MG capsule Commonly known as: IMODIUM Take 1  capsule by mouth 4 (four) times daily as needed for diarrhea or loose stools.   magnesium oxide 400 MG tablet Commonly known as: MAG-OX Take 800 mg by mouth daily.   midodrine 10 MG tablet Commonly known as: PROAMATINE Take 1 tablet (10 mg total) by mouth 3 (three) times daily with meals. What changed:   medication strength  how much to take   multivitamin with minerals Tabs tablet Take 1 tablet by mouth daily. Start taking on: September 22, 2019   Turmeric 500 MG Caps Take 2,000 mg by mouth daily. Taking 2 - 1000 mg tabs daily   vitamin B-12 500 MCG tablet Commonly known as: CYANOCOBALAMIN Take 500 mcg by mouth daily.            Discharge Care Instructions  (From admission, onward)         Start     Ordered   09/21/19 0000  Discharge wound care:       Comments: Apply Bactroban to left and right outer ankles and right lower extremity wounds daily and cover with foam dressings.  Change foam dressings every 3 days or as needed if heavily soiled   09/21/19 1222          No Known Allergies  Consultations: Oncology  Discharge Exam: BP 120/77 (BP Location: Right Arm)   Pulse 85   Temp 97.9 F (36.6 C) (Oral)   Resp 12   Ht 6\' 2"  (1.88 m)   Wt 82.2 kg   SpO2 100%   BMI 23.27 kg/m  General appearance: Chronically ill-appearing elderly man resting in bed in no distress Head: Normocephalic, without obvious abnormality, atraumatic Eyes: EOMI Lungs: clear to auscultation bilaterally Heart: regular rate and rhythm and S1, S2 normal Abdomen: normal findings: bowel sounds normal and soft, non-tender Extremities: No edema Skin: mobility and turgor normal Neurologic: Grossly normal  The results of significant diagnostics from this hospitalization (including imaging, microbiology, ancillary and laboratory) are listed below for reference.   Microbiology: No results found for this or any previous visit (from the past 240 hour(s)).   Labs: BNP (last 3  results) Recent Labs    04/28/19 0134  BNP 789.3*   Basic Metabolic Panel: Recent Labs  Lab 09/15/19 0400 09/15/19 0400 09/16/19 0422 09/16/19 0422 09/17/19 0420 09/18/19 0300 09/19/19 0440 09/20/19 0433 09/21/19 0400  NA 139   < > 139   < > 137 137 139 138 140  K 3.7   < > 3.5   < > 3.5 3.6 4.2 3.7 3.9  CL 103   < > 103   < > 101 103 103 102 103  CO2 26   < > 25   < > 26 26 26 26 27   GLUCOSE 174*   < >  96   < > 143* 111* 155* 82 120*  BUN 18   < > 16   < > 15 14 13 12 15   CREATININE 1.12   < > 0.92   < > 0.92 0.84 1.01 0.84 0.82  CALCIUM 8.1*   < > 8.4*   < > 8.5* 8.1* 8.4* 8.7* 8.7*  MG 1.8   < > 1.6*   < > 1.8 1.6* 1.6* 1.3* 1.5*  PHOS 2.2*  --  2.3*  --  2.2*  --   --   --   --    < > = values in this interval not displayed.   Liver Function Tests: Recent Labs  Lab 09/15/19 0400 09/16/19 0422 09/17/19 0420  AST 17 16 16   ALT 10 10 10   ALKPHOS 143* 132* 130*  BILITOT 0.5 0.4 0.6  PROT 5.4* 5.5* 5.8*  ALBUMIN 2.5* 2.5* 2.6*   No results for input(s): LIPASE, AMYLASE in the last 168 hours. No results for input(s): AMMONIA in the last 168 hours. CBC: Recent Labs  Lab 09/17/19 0420 09/18/19 0300 09/19/19 0440 09/20/19 0433 09/21/19 0400  WBC 17.5* 14.4* 9.7 11.5* 9.7  NEUTROABS 13.0* 10.0* 6.6 8.3* 6.9  HGB 9.1* 8.3* 8.1* 8.3* 8.2*  HCT 28.3* 26.4* 25.7* 26.9* 26.4*  MCV 90.7 92.0 93.1 92.1 93.3  PLT 242 231 213 237 237   Cardiac Enzymes: No results for input(s): CKTOTAL, CKMB, CKMBINDEX, TROPONINI in the last 168 hours. BNP: Invalid input(s): POCBNP CBG: Recent Labs  Lab 09/20/19 1140 09/20/19 1642 09/20/19 2123 09/21/19 0827 09/21/19 1201  GLUCAP 169* 167* 120* 117* 148*   D-Dimer No results for input(s): DDIMER in the last 72 hours. Hgb A1c No results for input(s): HGBA1C in the last 72 hours. Lipid Profile No results for input(s): CHOL, HDL, LDLCALC, TRIG, CHOLHDL, LDLDIRECT in the last 72 hours. Thyroid function studies No results  for input(s): TSH, T4TOTAL, T3FREE, THYROIDAB in the last 72 hours.  Invalid input(s): FREET3 Anemia work up No results for input(s): VITAMINB12, FOLATE, FERRITIN, TIBC, IRON, RETICCTPCT in the last 72 hours. Urinalysis    Component Value Date/Time   COLORURINE YELLOW 09/10/2019 2239   APPEARANCEUR CLOUDY (A) 09/10/2019 2239   LABSPEC 1.017 09/10/2019 2239   PHURINE 5.0 09/10/2019 2239   GLUCOSEU NEGATIVE 09/10/2019 2239   HGBUR LARGE (A) 09/10/2019 2239   BILIRUBINUR NEGATIVE 09/10/2019 2239   KETONESUR 5 (A) 09/10/2019 2239   PROTEINUR 100 (A) 09/10/2019 2239   NITRITE NEGATIVE 09/10/2019 2239   LEUKOCYTESUR LARGE (A) 09/10/2019 2239   Sepsis Labs Invalid input(s): PROCALCITONIN,  WBC,  LACTICIDVEN Microbiology No results found for this or any previous visit (from the past 240 hour(s)).  Procedures/Studies: CT ABDOMEN PELVIS W CONTRAST  Result Date: 09/12/2019 CLINICAL DATA:  Metastatic colon cancer. History of sigmoid colon stent. EXAM: CT ABDOMEN AND PELVIS WITH CONTRAST TECHNIQUE: Multidetector CT imaging of the abdomen and pelvis was performed using the standard protocol following bolus administration of intravenous contrast. CONTRAST:  160mL OMNIPAQUE IOHEXOL 300 MG/ML  SOLN COMPARISON:  07/10/2019 FINDINGS: Lower chest: The lung bases are clear of an acute process. No pulmonary nodules are identified. No pleural effusion. The heart is normal in size. Hepatobiliary: Stable appearing diffuse hepatic metastatic disease. Two adjacent lesions at the hepatic dome on image 14/2 measure 5.6 x 3.9 cm and previously measured 6.5 x 4.1 cm. Segment 6 lesion on image 25/2 measures 3.5 x 3.3 cm and previously measured 4.5 x 3.4 cm.  No new lesions or progressive findings. Small gallstones noted in the gallbladder. No findings for acute cholecystitis. Normal caliber common bile duct. Pancreas: Stable pancreatic atrophy. There is a subtle low-attenuation lesion in the lower pancreatic  head/uncinate process. This measures approximately 14 mm and previously measured approximately 10 mm. MRI abdomen without and with contrast may be helpful for further evaluation if clinically necessary given the patient's disease process. Spleen: Normal size. No focal lesions. Adrenals/Urinary Tract: The adrenal glands and kidneys are stable. Bilateral renal cysts are noted. No worrisome renal lesions or hydronephrosis. The bladder is slightly thick walled which could be due to partial bladder outlet obstruction from an enlarged prostate gland. Stomach/Bowel: The stomach, duodenum and small bowel are unremarkable. Stable metallic stent in the sigmoid colon. No findings for colonic obstruction. Vascular/Lymphatic: Normal and stable aorta and branch vessels. No atherosclerotic calcifications or aneurysm. The major venous structures are patent. Small scattered mesenteric and retroperitoneal lymph nodes are stable. Reproductive: Prostate gland is mildly enlarged. The seminal vesicles appear normal. Other: Mild presacral edema possibly radiation related. Small lymph nodes in the sigmoid mesocolon are stable. Musculoskeletal: No significant bony findings. IMPRESSION: 1. Stable to slightly improved diffuse hepatic metastatic disease. No new or progressive findings. 2. Slight interval enlargement of the low-attenuation lesion in the lower pancreatic head/uncinate process. Recommend MRI abdomen without and with contrast for further evaluation if clinically necessary. 3. Stable metallic stent in the sigmoid colon. No findings for colonic obstruction. 4. Cholelithiasis. 5. Stable small scattered mesenteric and retroperitoneal lymph nodes. No new or progressive findings. 6. Stable presacral edema and small lymph nodes in the sigmoid mesocolon. 7. Aortic atherosclerosis. Aortic Atherosclerosis (ICD10-I70.0). Electronically Signed   By: Marijo Sanes M.D.   On: 09/12/2019 05:49   DG Chest Port 1 View  Result Date:  09/10/2019 CLINICAL DATA:  Weakness with leukocytosis. EXAM: PORTABLE CHEST 1 VIEW COMPARISON:  Chest x-ray dated July 09, 2019. FINDINGS: Unchanged right chest wall port catheter. The heart size and mediastinal contours are within normal limits. Prior CABG and left atrial appendage clipping. Normal pulmonary vascularity. No focal consolidation, pleural effusion, or pneumothorax. No acute osseous abnormality. IMPRESSION: No active disease. Electronically Signed   By: Titus Dubin M.D.   On: 09/10/2019 12:17     Time coordinating discharge: Over 30 minutes    Dwyane Dee, MD  [facility] 09/21/2019, 12:25 PM Pager: Secure chat  If 7PM-7AM, please contact night-coverage [email or web address] Password [email or web address]

## 2019-09-21 NOTE — Assessment & Plan Note (Signed)
-   continue daily wound care; turning in bed frequently

## 2019-09-23 DIAGNOSIS — R5381 Other malaise: Secondary | ICD-10-CM | POA: Diagnosis not present

## 2019-09-23 DIAGNOSIS — I482 Chronic atrial fibrillation, unspecified: Secondary | ICD-10-CM | POA: Diagnosis not present

## 2019-09-23 DIAGNOSIS — I5022 Chronic systolic (congestive) heart failure: Secondary | ICD-10-CM | POA: Diagnosis not present

## 2019-09-23 DIAGNOSIS — D010 Carcinoma in situ of colon: Secondary | ICD-10-CM | POA: Diagnosis not present

## 2019-09-24 ENCOUNTER — Other Ambulatory Visit: Payer: Self-pay

## 2019-09-24 NOTE — Patient Outreach (Signed)
[facility]) Care Management  09/24/2019  Clarence Andrews. 1950-10-28 [id number]     Transition of Care Referral  Referral Date: 09/24/2019 Referral Source: [facility] Discharge Report Date of Discharge:09/21/2019 Facility: [place]: [facility]    Referral received. Upon chart review noted that patient discharged to SNF.     Plan: RN CM will close case at this time.    Enzo Montgomery, RN,BSN,CCM [place] Management Telephonic Care Management Coordinator Direct Phone: [phone] Toll Free: [phone] Fax: [phone]

## 2019-09-25 ENCOUNTER — Telehealth: Payer: Self-pay | Admitting: *Deleted

## 2019-09-25 NOTE — Telephone Encounter (Signed)
Called and left VM requesting MD give him his chemo tomorrow. He is concerned he has missed so many weeks. Also wants to talk w/office manager tomorrow about his insurance. Called him back and left VM that Dr. [name] wants to give him chemo as long as his counts are good and his cardiac status is stable. We never intentionally hold chemo unless giving it would cause harm. Will make financial staff aware of request re: insurance discussion.

## 2019-09-26 ENCOUNTER — Inpatient Hospital Stay: Payer: Medicare HMO

## 2019-09-26 ENCOUNTER — Inpatient Hospital Stay (HOSPITAL_BASED_OUTPATIENT_CLINIC_OR_DEPARTMENT_OTHER): Payer: Medicare HMO | Admitting: Oncology

## 2019-09-26 ENCOUNTER — Other Ambulatory Visit: Payer: Self-pay

## 2019-09-26 ENCOUNTER — Telehealth: Payer: Self-pay | Admitting: Oncology

## 2019-09-26 ENCOUNTER — Inpatient Hospital Stay: Payer: Medicare HMO | Attending: Oncology

## 2019-09-26 ENCOUNTER — Inpatient Hospital Stay: Payer: Medicare HMO | Admitting: Nutrition

## 2019-09-26 VITALS — BP 96/66 | HR 105 | Temp 98.7°F | Resp 20

## 2019-09-26 VITALS — BP 66/45 | HR 100 | Temp 97.9°F | Resp 18 | Ht 74.0 in

## 2019-09-26 DIAGNOSIS — Z5111 Encounter for antineoplastic chemotherapy: Secondary | ICD-10-CM | POA: Insufficient documentation

## 2019-09-26 DIAGNOSIS — Z85810 Personal history of malignant neoplasm of tongue: Secondary | ICD-10-CM | POA: Insufficient documentation

## 2019-09-26 DIAGNOSIS — C186 Malignant neoplasm of descending colon: Secondary | ICD-10-CM

## 2019-09-26 DIAGNOSIS — A0472 Enterocolitis due to Clostridium difficile, not specified as recurrent: Secondary | ICD-10-CM | POA: Diagnosis not present

## 2019-09-26 DIAGNOSIS — D649 Anemia, unspecified: Secondary | ICD-10-CM

## 2019-09-26 DIAGNOSIS — E46 Unspecified protein-calorie malnutrition: Secondary | ICD-10-CM | POA: Diagnosis not present

## 2019-09-26 DIAGNOSIS — C19 Malignant neoplasm of rectosigmoid junction: Secondary | ICD-10-CM | POA: Diagnosis not present

## 2019-09-26 DIAGNOSIS — C787 Secondary malignant neoplasm of liver and intrahepatic bile duct: Secondary | ICD-10-CM | POA: Diagnosis not present

## 2019-09-26 DIAGNOSIS — I951 Orthostatic hypotension: Secondary | ICD-10-CM | POA: Diagnosis not present

## 2019-09-26 DIAGNOSIS — Z8616 Personal history of COVID-19: Secondary | ICD-10-CM | POA: Insufficient documentation

## 2019-09-26 DIAGNOSIS — Z923 Personal history of irradiation: Secondary | ICD-10-CM | POA: Insufficient documentation

## 2019-09-26 DIAGNOSIS — Z79899 Other long term (current) drug therapy: Secondary | ICD-10-CM | POA: Insufficient documentation

## 2019-09-26 DIAGNOSIS — E119 Type 2 diabetes mellitus without complications: Secondary | ICD-10-CM | POA: Insufficient documentation

## 2019-09-26 DIAGNOSIS — Z9221 Personal history of antineoplastic chemotherapy: Secondary | ICD-10-CM | POA: Diagnosis not present

## 2019-09-26 DIAGNOSIS — C187 Malignant neoplasm of sigmoid colon: Secondary | ICD-10-CM

## 2019-09-26 LAB — CMP (CANCER CENTER ONLY)
ALT: 18 U/L (ref 0–44)
AST: 34 U/L (ref 15–41)
Albumin: 2.3 g/dL — ABNORMAL LOW (ref 3.5–5.0)
Alkaline Phosphatase: 172 U/L — ABNORMAL HIGH (ref 38–126)
Anion gap: 9 (ref 5–15)
BUN: 21 mg/dL (ref 8–23)
CO2: 26 mmol/L (ref 22–32)
Calcium: 9.9 mg/dL (ref 8.9–10.3)
Chloride: 99 mmol/L (ref 98–111)
Creatinine: 1.29 mg/dL — ABNORMAL HIGH (ref 0.61–1.24)
GFR, Est AFR Am: >60 mL/min (ref >60)
GFR, Estimated: 55 mL/min — ABNORMAL LOW (ref >60)
Glucose, Bld: 227 mg/dL — ABNORMAL HIGH (ref 70–99)
Potassium: 4.7 mmol/L (ref 3.5–5.1)
Sodium: 134 mmol/L — ABNORMAL LOW (ref 135–145)
Total Bilirubin: 0.6 mg/dL (ref 0.3–1.2)
Total Protein: 6.9 g/dL (ref 6.5–8.1)

## 2019-09-26 LAB — CBC WITH DIFFERENTIAL (CANCER CENTER ONLY)
Abs Immature Granulocytes: 0.12 10*3/uL — ABNORMAL HIGH (ref 0.00–0.07)
Basophils Absolute: 0 10*3/uL (ref 0.0–0.1)
Basophils Relative: 0 %
Eosinophils Absolute: 0 10*3/uL (ref 0.0–0.5)
Eosinophils Relative: 0 %
HCT: 29.2 % — ABNORMAL LOW (ref 39.0–52.0)
Hemoglobin: 9 g/dL — ABNORMAL LOW (ref 13.0–17.0)
Immature Granulocytes: 1 %
Lymphocytes Relative: 11 %
Lymphs Abs: 1.2 10*3/uL (ref 0.7–4.0)
MCH: 28 pg (ref 26.0–34.0)
MCHC: 30.8 g/dL (ref 30.0–36.0)
MCV: 91 fL (ref 80.0–100.0)
Monocytes Absolute: 1 10*3/uL (ref 0.1–1.0)
Monocytes Relative: 8 %
Neutro Abs: 9.1 10*3/uL — ABNORMAL HIGH (ref 1.7–7.7)
Neutrophils Relative %: 79 %
Platelet Count: 423 10*3/uL — ABNORMAL HIGH (ref 150–400)
RBC: 3.21 MIL/uL — ABNORMAL LOW (ref 4.22–5.81)
RDW: 16 % — ABNORMAL HIGH (ref 11.5–15.5)
WBC Count: 11.4 10*3/uL — ABNORMAL HIGH (ref 4.0–10.5)
nRBC: 0 % (ref 0.0–0.2)

## 2019-09-26 LAB — SAMPLE TO BLOOD BANK

## 2019-09-26 LAB — CEA (IN HOUSE-CHCC): CEA (CHCC-In House): 94.06 ng/mL — ABNORMAL HIGH (ref 0.00–5.00)

## 2019-09-26 LAB — MAGNESIUM: Magnesium: 1.5 mg/dL — ABNORMAL LOW (ref 1.7–2.4)

## 2019-09-26 MED ORDER — MAGNESIUM SULFATE 4 GM/100ML IV SOLN
4.0000 g | Freq: Once | INTRAVENOUS | Status: AC
  Administered 2019-09-26: 4 g via INTRAVENOUS
  Filled 2019-09-26: qty 100

## 2019-09-26 MED ORDER — SODIUM CHLORIDE 0.9 % IV SOLN
1600.0000 mg/m2 | INTRAVENOUS | Status: DC
  Administered 2019-09-26: 3300 mg via INTRAVENOUS
  Filled 2019-09-26: qty 66

## 2019-09-26 NOTE — Progress Notes (Signed)
Per Dr. [name] :OK to treat today w/pulse 105 and low BP. Will only give 5FU pump w/dose reduction.

## 2019-09-26 NOTE — Telephone Encounter (Signed)
Scheduled appointments per 8/26 los. Patient's wife is aware of appointments, but she declined a calendar print out.

## 2019-09-26 NOTE — Patient Instructions (Signed)
Clarence Andrews Discharge Instructions for Patients Receiving Chemotherapy  Today you received the following chemotherapy agents:   To help prevent nausea and vomiting after your treatment, we encourage you to take your nausea medication as prescribed.   If you develop nausea and vomiting that is not controlled by your nausea medication, call the clinic.   BELOW ARE SYMPTOMS THAT SHOULD BE REPORTED IMMEDIATELY:  *FEVER GREATER THAN 100.5 F  *CHILLS WITH OR WITHOUT FEVER  NAUSEA AND VOMITING THAT IS NOT CONTROLLED WITH YOUR NAUSEA MEDICATION  *UNUSUAL SHORTNESS OF BREATH  *UNUSUAL BRUISING OR BLEEDING  TENDERNESS IN MOUTH AND THROAT WITH OR WITHOUT PRESENCE OF ULCERS  *URINARY PROBLEMS  *BOWEL PROBLEMS  UNUSUAL RASH Items with * indicate a potential emergency and should be followed up as soon as possible.  Feel free to call the clinic should you have any questions or concerns. The clinic phone number is [phone].  Please show the [place] at check-in to the Emergency Department and triage nurse.   Hypomagnesemia Hypomagnesemia is a condition in which the level of magnesium in the blood is low. Magnesium is a mineral that is found in many foods. It is used in many different processes in the body. Hypomagnesemia can affect every organ in the body. In severe cases, it can cause life-threatening problems. What are the causes? This condition may be caused by:  Not getting enough magnesium in your diet.  Malnutrition.  Problems with absorbing magnesium from the intestines.  Dehydration.  Alcohol abuse.  Vomiting.  Severe or chronic diarrhea.  Some medicines, including medicines that make you urinate more (diuretics).  Certain diseases, such as kidney disease, diabetes, celiac disease, and overactive thyroid. What are the signs or symptoms? Symptoms of this condition include:  Loss of appetite.  Nausea and vomiting.  Involuntary shaking  or trembling of a body part (tremor).  Muscle weakness.  Tingling in the arms and legs.  Sudden tightening of muscles (muscle spasms).  Confusion.  Psychiatric issues, such as depression, irritability, or psychosis.  A feeling of fluttering of the heart.  Seizures. These symptoms are more severe if magnesium levels drop suddenly. How is this diagnosed? This condition may be diagnosed based on:  Your symptoms and medical history.  A physical exam.  Blood and urine tests. How is this treated? Treatment depends on the cause and the severity of the condition. It may be treated with:  A magnesium supplement. This can be taken in pill form. If the condition is severe, magnesium is usually given through an IV.  Changes to your diet. You may be directed to eat foods that have a lot of magnesium, such as green leafy vegetables, peas, beans, and nuts.  Stopping any intake of alcohol. Follow these instructions at home:      Make sure that your diet includes foods with magnesium. Foods that have a lot of magnesium in them include: ? Green leafy vegetables, such as spinach and broccoli. ? Beans and peas. ? Nuts and seeds, such as almonds and sunflower seeds. ? Whole grains, such as whole grain bread and fortified cereals.  Take magnesium supplements if your health care provider tells you to do that. Take them as directed.  Take over-the-counter and prescription medicines only as told by your health care provider.  Have your magnesium levels monitored as told by your health care provider.  When you are active, drink fluids that contain electrolytes.  Avoid drinking alcohol.  Keep all follow-up  visits as told by your health care provider. This is important. Contact a health care provider if:  You get worse instead of better.  Your symptoms return. Get help right away if you:  Develop severe muscle weakness.  Have trouble breathing.  Feel that your heart is  racing. Summary  Hypomagnesemia is a condition in which the level of magnesium in the blood is low.  Hypomagnesemia can affect every organ in the body.  Treatment may include eating more foods that contain magnesium, taking magnesium supplements, and not drinking alcohol.  Have your magnesium levels monitored as told by your health care provider. This information is not intended to replace advice given to you by your health care provider. Make sure you discuss any questions you have with your health care provider. Document Revised: 12/30/2016 Document Reviewed: 12/19/2016 Elsevier Patient Education  2020 [employer].

## 2019-09-26 NOTE — Progress Notes (Signed)
Mg+ returned at 1.5 today. Per Dr. [name]: Have pharmacy dose infusion of Mg+ today.

## 2019-09-26 NOTE — Progress Notes (Signed)
Nutrition follow-up completed with patient during infusion for colorectal cancer. Patient reports he currently lives in a nursing facility. He states that food is not good.   Weight has decreased and was documented as 181.22 pounds on August 18. Patient reports he does receive Ensure Enlive between meals at times. States he has worked with a Film/video editor to let them know his food preferences.  Nutrition diagnosis:  Inadequate oral intake continues.   Moderate malnutrition continues.  Intervention: Educated patient on strategies for requesting favorite foods in small amounts throughout the day. Encouraged him to continue to communicate with staff so they can provide him with food preferences. Encouraged him to increase oral nutrition supplements for added calories and protein.  Monitoring, evaluation, goals: Patient will tolerate increased calories and protein to minimize further weight loss and promote healing.  Next visit: To be scheduled with treatments as needed.  **Disclaimer: This note was dictated with voice recognition software. Similar sounding words can inadvertently be transcribed and this note may contain transcription errors which may not have been corrected upon publication of note.**

## 2019-09-26 NOTE — Progress Notes (Signed)
MD ok'd to use current wt for dose of 5FU.  Kennith Center, Pharm.D., CPP 09/26/2019@12 :52 PM

## 2019-09-26 NOTE — Progress Notes (Signed)
Positive orthos; pt weak upon transfer. Dr. [name] aware. Please check BP when d/c pump per Dr. [name].

## 2019-09-26 NOTE — Progress Notes (Signed)
CRITICAL VALUE STICKER  CRITICAL VALUE: Hgb 6.1  RECEIVER (on-site recipient of call): Lyle Leisner,RN   DATE & TIME NOTIFIED: 09/26/19 at [place] (representative from lab): Maudie Mercury, RN  MD NOTIFIED: Dr. [name]  TIME OF NOTIFICATION: 09/26/19 @ 1119  RESPONSE: TXM drawn for transfusion. Labs recollected at request of lab due to [facility] not looking accurate. Redraw has Hgb 9.0

## 2019-09-26 NOTE — Progress Notes (Signed)
[place] OFFICE PROGRESS NOTE   Diagnosis: Colon cancer  INTERVAL HISTORY:   Clarence Andrews was discharged from the hospital on 09/21/2019 after a lengthy hospital admission with diarrhea and C. difficile infection.  He had persistent orthostatic hypotension while in the hospital.  He was discharged to a skilled nursing facility.  He has no new complaint.  He reports no diarrhea at present.  Objective:  Vital signs in last 24 hours:  Blood pressure (!) 66/45, pulse 100, temperature 97.9 F (36.6 C), temperature source Axillary, resp. rate 18, height 6' 2"  (1.88 m), SpO2 95 %.  Repeat manual blood pressure sitting 80/46, pulse 96, supine systolic blood pressure 272    HEENT: No thrush Resp: Lungs clear bilaterally Cardio: Regular rate and rhythm, distant heart sounds GI: No hepatomegaly, nontender Vascular: Chronic darkening and stasis change at the lower leg bilaterally Neuro: Alert and oriented    Portacath/PICC-without erythema  Lab Results:  Lab Results  Component Value Date   WBC 11.4 (H) 09/26/2019   HGB 9.0 (L) 09/26/2019   HCT 29.2 (L) 09/26/2019   MCV 91.0 09/26/2019   PLT 423 (H) 09/26/2019   NEUTROABS 9.1 (H) 09/26/2019    CMP  Lab Results  Component Value Date   NA 134 (L) 09/26/2019   K 4.7 09/26/2019   CL 99 09/26/2019   CO2 26 09/26/2019   GLUCOSE 227 (H) 09/26/2019   BUN 21 09/26/2019   CREATININE 1.29 (H) 09/26/2019   CALCIUM 9.9 09/26/2019   PROT 6.9 09/26/2019   ALBUMIN 2.3 (L) 09/26/2019   AST 34 09/26/2019   ALT 18 09/26/2019   ALKPHOS 172 (H) 09/26/2019   BILITOT 0.6 09/26/2019   GFRNONAA 55 (L) 09/26/2019   GFRAA >60 09/26/2019    Lab Results  Component Value Date   CEA1 94.06 (H) 09/26/2019    Medications: I have reviewed the patient's current medications.   Assessment/Plan: 1. 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 Colonoscopy 03/07/2019-nearly completely obstructing  mass beginning at 16 cm from the anal verge, sigmoid colon polyp ? CTs 03/15/2019-wall thickening of the lower sigmoid colon, low rectal mass with significant luminal narrowing, diffuse liver metastases, borderline enlarged sigmoid mesocolon, iliac, and upper abdominal lymph nodes ? Elevated CEA ? 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 Cycle 1 FOLFOX 04/11/2019 ? Cycle 2 FOLFOX 05/28/2019 ? Cycle 3 FOLFOX 06/11/2019 ? Cycle 4 FOLFOX 06/25/2019 ? CT abdomen/pelvis 07/10/2019-severe obstructing stricture at the sigmoid colon, stable to slight improvement in hepatic metastatic disease compared to February 2021 ? Cycle 5 FOLFOX 07/31/2019 ? Cycle6FOLFOX 08/13/2019 ? Cycle 7 FOLFOX 08/27/2019 ? CT abdomen/pelvis 09/12/2019-stable and slightly decreased hepatic lesions, no evidence of progressive metastatic disease, slight enlargement of a previously noted pancreas head/uncinate lesion, no evidence of colonic obstruction, stable small mesenteric and retroperitoneal nodes ? Cycle 8 FOLFOX 09/26/2019, oxaliplatin held secondary to persistent orthostatic hypotension 2. 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 Diabetes 4. Coronary artery disease, status post coronary artery bypass surgery in 2017 5. Neutropenia following cycle 1 FOLFOX 6. Admission 04/28/2019 with COVID-19 infection 7. C. difficile colitis 04/28/2019 8. Atrial flutter during hospital admission March/April 2021-treated with a Cardizem drip, digoxin, and Eliquis anticoagulation. Converted to metoprolol at discharge.  Admission with recurrent rapid atrial fibrillation/flutter 07/09/2019 9. 07/09/2019-hospital admission for  bowel obstruction  Sigmoidoscopy 07/12/2019-completely obstructing mass at 17-20 cm  proximal to the anus-oozing present, stent placed 10. Fall 07/24/2019 with acute kidney injury/rhabdomyolysis 11. 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 13.Right foot drop is likely due to weight loss/nerve compression 14.  C. 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 Kneeland appears unchanged.  Diarrhea has resolved.  I suspect the diarrhea was related to C. difficile infection, but it is possible the chemotherapy has contributed.  We will dose reduce the 5-fluorouracil with chemotherapy today.  He continues to have orthostatic hypotension.  The etiology of the orthostasis is unclear, but I am concerned this may be secondary to oxaliplatin.  Oxaliplatin will be placed on hold.  I recommend leg support stockings in addition to midodrine.  He will receive IV magnesium supplementation today.  Mr. Sippel will return for an office visit in the next cycle of chemotherapy in 2 weeks.  Betsy Coder, MD  09/26/2019  2:08 PM

## 2019-09-27 ENCOUNTER — Telehealth: Payer: Self-pay

## 2019-09-27 ENCOUNTER — Telehealth: Payer: Self-pay | Admitting: Oncology

## 2019-09-27 NOTE — Telephone Encounter (Signed)
Scheduled appointments per 8/26 los. Left message for patient with appointments dates and times.

## 2019-09-27 NOTE — Telephone Encounter (Signed)
Pt called to cancel his appt tomorrow 09/28/19 Scheduling contacts this nurse to inquire about cancelled appt as its noted pt received a 5FU pump yesterday and is to come in to stop pump this nurse made several attempts at contact facility being left on hold for greater than 27 minutes no one responded to several attempts.

## 2019-09-28 ENCOUNTER — Inpatient Hospital Stay: Payer: Medicare HMO

## 2019-09-28 NOTE — Progress Notes (Deleted)
No show today for pump stop. Called pt. And [facility] facility [facility] [phone]) Spoke with Eustaquio Maize, RN.     1. There is NO transportation from pts facility on the weekend. (Scheduling message sent to avoid treatment on Thursdays.)    2. While on the phone with  Beth, RN, I verbally instructed her to stop pump, turn off pump, and disconnect pump tubing. She confirmed that she was placing the contents in a biohazard bag. She stated that she was skilled in disconnecting a PAC with flush and heparin, and did so. Pts pump was alarming and deactivated at approximately  1345. Pts next appointment is 10/14/19. Beth, RN stated she would make a note to bring the pump with the pt on that date.

## 2019-10-01 DIAGNOSIS — D010 Carcinoma in situ of colon: Secondary | ICD-10-CM | POA: Diagnosis not present

## 2019-10-01 DIAGNOSIS — R5381 Other malaise: Secondary | ICD-10-CM | POA: Diagnosis not present

## 2019-10-01 DIAGNOSIS — L03116 Cellulitis of left lower limb: Secondary | ICD-10-CM | POA: Diagnosis not present

## 2019-10-01 DIAGNOSIS — I479 Paroxysmal tachycardia, unspecified: Secondary | ICD-10-CM | POA: Diagnosis not present

## 2019-10-02 ENCOUNTER — Telehealth: Payer: Self-pay

## 2019-10-02 DIAGNOSIS — L89520 Pressure ulcer of left ankle, unstageable: Secondary | ICD-10-CM | POA: Diagnosis not present

## 2019-10-02 NOTE — Telephone Encounter (Signed)
[facility] called to confirm upcoming appts pt did cancel previous pump stop however nursing home states nursing de accessed and removed pump at facility.

## 2019-10-08 DIAGNOSIS — L89890 Pressure ulcer of other site, unstageable: Secondary | ICD-10-CM | POA: Diagnosis not present

## 2019-10-08 DIAGNOSIS — L89510 Pressure ulcer of right ankle, unstageable: Secondary | ICD-10-CM | POA: Diagnosis not present

## 2019-10-08 DIAGNOSIS — L89520 Pressure ulcer of left ankle, unstageable: Secondary | ICD-10-CM | POA: Diagnosis not present

## 2019-10-09 ENCOUNTER — Telehealth: Payer: Self-pay | Admitting: *Deleted

## 2019-10-09 NOTE — Telephone Encounter (Signed)
Called to report his current [employer] plan is not in network for [facility] and he has a chemo scheduled for 10/14/19. He has signed up for new plan today, a [facility] plan. Patient wants to know how to handle his appointments on 10/14/19. Per Dr. [name]: Would still like to see him on 9/13, but can reschedule his chemo to after 11/01/19.

## 2019-10-11 NOTE — Telephone Encounter (Signed)
Attempted to call patient to discuss keeping his OV on Monday and future chemo dates will also be discussed at that time. Was not available.

## 2019-10-13 ENCOUNTER — Other Ambulatory Visit: Payer: Self-pay | Admitting: Oncology

## 2019-10-14 ENCOUNTER — Inpatient Hospital Stay: Payer: Medicare HMO | Attending: Oncology

## 2019-10-14 ENCOUNTER — Other Ambulatory Visit: Payer: Self-pay

## 2019-10-14 ENCOUNTER — Encounter: Payer: Self-pay | Admitting: Nurse Practitioner

## 2019-10-14 ENCOUNTER — Inpatient Hospital Stay: Payer: Medicare HMO

## 2019-10-14 ENCOUNTER — Inpatient Hospital Stay (HOSPITAL_BASED_OUTPATIENT_CLINIC_OR_DEPARTMENT_OTHER): Payer: Medicare HMO | Admitting: Nurse Practitioner

## 2019-10-14 VITALS — BP 103/58 | HR 61 | Temp 97.1°F | Resp 18

## 2019-10-14 DIAGNOSIS — E119 Type 2 diabetes mellitus without complications: Secondary | ICD-10-CM | POA: Diagnosis not present

## 2019-10-14 DIAGNOSIS — Z5111 Encounter for antineoplastic chemotherapy: Secondary | ICD-10-CM | POA: Diagnosis present

## 2019-10-14 DIAGNOSIS — I951 Orthostatic hypotension: Secondary | ICD-10-CM | POA: Insufficient documentation

## 2019-10-14 DIAGNOSIS — C19 Malignant neoplasm of rectosigmoid junction: Secondary | ICD-10-CM | POA: Diagnosis present

## 2019-10-14 DIAGNOSIS — Z79899 Other long term (current) drug therapy: Secondary | ICD-10-CM | POA: Diagnosis not present

## 2019-10-14 DIAGNOSIS — C187 Malignant neoplasm of sigmoid colon: Secondary | ICD-10-CM | POA: Diagnosis not present

## 2019-10-14 DIAGNOSIS — C787 Secondary malignant neoplasm of liver and intrahepatic bile duct: Secondary | ICD-10-CM | POA: Insufficient documentation

## 2019-10-14 DIAGNOSIS — D649 Anemia, unspecified: Secondary | ICD-10-CM

## 2019-10-14 DIAGNOSIS — Z95828 Presence of other vascular implants and grafts: Secondary | ICD-10-CM

## 2019-10-14 DIAGNOSIS — C186 Malignant neoplasm of descending colon: Secondary | ICD-10-CM

## 2019-10-14 LAB — CBC WITH DIFFERENTIAL (CANCER CENTER ONLY)
Abs Immature Granulocytes: 0.03 10*3/uL (ref 0.00–0.07)
Basophils Absolute: 0.1 10*3/uL (ref 0.0–0.1)
Basophils Relative: 1 %
Eosinophils Absolute: 0.2 10*3/uL (ref 0.0–0.5)
Eosinophils Relative: 2 %
HCT: 28.6 % — ABNORMAL LOW (ref 39.0–52.0)
Hemoglobin: 8.9 g/dL — ABNORMAL LOW (ref 13.0–17.0)
Immature Granulocytes: 0 %
Lymphocytes Relative: 18 %
Lymphs Abs: 1.8 10*3/uL (ref 0.7–4.0)
MCH: 28.5 pg (ref 26.0–34.0)
MCHC: 31.1 g/dL (ref 30.0–36.0)
MCV: 91.7 fL (ref 80.0–100.0)
Monocytes Absolute: 0.8 10*3/uL (ref 0.1–1.0)
Monocytes Relative: 8 %
Neutro Abs: 7.5 10*3/uL (ref 1.7–7.7)
Neutrophils Relative %: 71 %
Platelet Count: 251 10*3/uL (ref 150–400)
RBC: 3.12 MIL/uL — ABNORMAL LOW (ref 4.22–5.81)
RDW: 15.7 % — ABNORMAL HIGH (ref 11.5–15.5)
WBC Count: 10.5 10*3/uL (ref 4.0–10.5)
nRBC: 0 % (ref 0.0–0.2)

## 2019-10-14 LAB — MAGNESIUM: Magnesium: 1.3 mg/dL — CL (ref 1.7–2.4)

## 2019-10-14 LAB — CMP (CANCER CENTER ONLY)
ALT: 17 U/L (ref 0–44)
AST: 21 U/L (ref 15–41)
Albumin: 2.6 g/dL — ABNORMAL LOW (ref 3.5–5.0)
Alkaline Phosphatase: 157 U/L — ABNORMAL HIGH (ref 38–126)
Anion gap: 8 (ref 5–15)
BUN: 23 mg/dL (ref 8–23)
CO2: 29 mmol/L (ref 22–32)
Calcium: 9.3 mg/dL (ref 8.9–10.3)
Chloride: 102 mmol/L (ref 98–111)
Creatinine: 1.08 mg/dL (ref 0.61–1.24)
GFR, Est AFR Am: >60 mL/min (ref >60)
GFR, Estimated: >60 mL/min (ref >60)
Glucose, Bld: 246 mg/dL — ABNORMAL HIGH (ref 70–99)
Potassium: 4.3 mmol/L (ref 3.5–5.1)
Sodium: 139 mmol/L (ref 135–145)
Total Bilirubin: 0.7 mg/dL (ref 0.3–1.2)
Total Protein: 6.5 g/dL (ref 6.5–8.1)

## 2019-10-14 MED ORDER — SODIUM CHLORIDE 0.9% FLUSH
10.0000 mL | Freq: Once | INTRAVENOUS | Status: AC
  Administered 2019-10-14: 10 mL
  Filled 2019-10-14: qty 10

## 2019-10-14 MED ORDER — SODIUM CHLORIDE 0.9 % IV SOLN
1600.0000 mg/m2 | INTRAVENOUS | Status: DC
  Administered 2019-10-14: 3300 mg via INTRAVENOUS
  Filled 2019-10-14: qty 66

## 2019-10-14 MED ORDER — SODIUM CHLORIDE 0.9% FLUSH
10.0000 mL | INTRAVENOUS | Status: DC | PRN
  Administered 2019-10-14: 10 mL
  Filled 2019-10-14: qty 10

## 2019-10-14 MED ORDER — SODIUM CHLORIDE 0.9 % IV SOLN
Freq: Once | INTRAVENOUS | Status: AC
  Filled 2019-10-14: qty 250

## 2019-10-14 MED ORDER — DEXTROSE 5 % IV SOLN
Freq: Once | INTRAVENOUS | Status: DC
  Filled 2019-10-14: qty 250

## 2019-10-14 MED ORDER — MAGNESIUM SULFATE 4 GM/100ML IV SOLN
4.0000 g | Freq: Once | INTRAVENOUS | Status: AC
  Administered 2019-10-14: 4 g via INTRAVENOUS
  Filled 2019-10-14: qty 100

## 2019-10-14 NOTE — Progress Notes (Addendum)
[place] OFFICE PROGRESS NOTE   Diagnosis: Colon cancer  INTERVAL HISTORY:   [name] returns for follow-up.  He completed a cycle of 5-fluorouracil 09/26/2019.  He denies nausea/vomiting.  No mouth sores.  No diarrhea.  No hand or foot pain or redness.  He denies pain.  He reports a good appetite.  Objective:  Vital signs in last 24 hours:  Blood pressure (!) 103/58, pulse 61, temperature (!) 97.1 F (36.2 C), temperature source Oral, resp. rate 18, SpO2 95 %.    HEENT: No thrush or ulcers. Resp: Lungs clear bilaterally. Cardio: Regular rate and rhythm, distant heart sounds. GI: Abdomen soft and nontender.  No hepatomegaly. Vascular: No leg edema. Port-A-Cath without erythema.  Lab Results:  Lab Results  Component Value Date   WBC 10.5 10/14/2019   HGB 8.9 (L) 10/14/2019   HCT 28.6 (L) 10/14/2019   MCV 91.7 10/14/2019   PLT 251 10/14/2019   NEUTROABS 7.5 10/14/2019    Imaging:  No results found.  Medications: I have reviewed the patient's current medications.  Assessment/Plan: 1. 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 Colonoscopy 03/07/2019-nearly completely obstructing mass beginning at 16 cm from the anal verge, sigmoid colon polyp ? CTs 03/15/2019-wall thickening of the lower sigmoid colon, low rectal mass with significant luminal narrowing, diffuse liver metastases, borderline enlarged sigmoid mesocolon, iliac, and upper abdominal lymph nodes ? Elevated CEA ? 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 Cycle 1 FOLFOX 04/11/2019 ? Cycle 2 FOLFOX 05/28/2019 ? Cycle 3 FOLFOX 06/11/2019 ? Cycle 4 FOLFOX 06/25/2019 ? CT abdomen/pelvis 07/10/2019-severe obstructing stricture at the sigmoid colon, stable to slight improvement in hepatic metastatic disease compared to  February 2021 ? Cycle 5 FOLFOX 07/31/2019 ? Cycle6FOLFOX 08/13/2019 ? Cycle 7 FOLFOX 08/27/2019 ? CT abdomen/pelvis 09/12/2019-stable and slightly decreased hepatic lesions, no evidence of progressive metastatic disease, slight enlargement of a previously noted pancreas head/uncinate lesion, no evidence of colonic obstruction, stable small mesenteric and retroperitoneal nodes ? Cycle 8 FOLFOX 09/26/2019, oxaliplatin held secondary to persistent orthostatic hypotension ? Cycle 9 FOLFOX 10/14/2019, oxaliplatin on hold secondary to persistent orthostatic hypotension 2. 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 Diabetes 4. Coronary artery disease, status post coronary artery bypass surgery in 2017 5. Neutropenia following cycle 1 FOLFOX 6. Admission 04/28/2019 with COVID-19 infection 7. C. difficile colitis 04/28/2019 8. Atrial flutter during hospital admission March/April 2021-treated with a Cardizem drip, digoxin, and Eliquis anticoagulation. Converted to metoprolol at discharge.  Admission with recurrent rapid atrial fibrillation/flutter 07/09/2019 9. 07/09/2019-hospital admission for bowel obstruction  Sigmoidoscopy 07/12/2019-completely obstructing mass at 17-20 cm proximal to the anus-oozing present, stent placed 10. Fall 07/24/2019 with acute kidney injury/rhabdomyolysis 11. 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 13.Right foot drop is likely due to weight loss/nerve compression 14. C. 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  He has completed 8 cycles of FOLFOX.  Oxaliplatin was held with cycle 8 due to persistent orthostatic hypotension.  Plan to proceed with cycle 9 FOLFOX today as scheduled, continue to hold oxaliplatin.  We reviewed the CBC from  today.  Counts adequate to proceed with treatment.  He will return for lab, follow-up, treatment in 2 weeks.  He will contact the office in  the interim with any problems.  Patient seen with Dr. [name].    Ned Card ANP/GNP-BC   10/14/2019  10:12 AM  This was a shared visit with Ned Card.  Clarence Andrews reports persistent orthostatic symptoms.  The plan is to continue single agent 5-fluorouracil chemotherapy.  He will receive magnesium supplementation today.  Julieanne Manson, MD

## 2019-10-14 NOTE — Progress Notes (Signed)
Per Np ok to tx with magnesium 1.3 pharmacy called to dose and input orders

## 2019-10-14 NOTE — Progress Notes (Signed)
LATE ENTRY-Received call this morning with critical magnesium of 1.3 on patient.  Reported value face to face to Ned Card, NP.  Patient is currently in infusion. Gardiner Rhyme, RN

## 2019-10-14 NOTE — Patient Instructions (Signed)
[place] Discharge Instructions for Patients Receiving Chemotherapy  Today you received the following chemotherapy agents: fluorouracil  To help prevent nausea and vomiting after your treatment, we encourage you to take your nausea medication as prescribed.   If you develop nausea and vomiting that is not controlled by your nausea medication, call the clinic.   BELOW ARE SYMPTOMS THAT SHOULD BE REPORTED IMMEDIATELY:  *FEVER GREATER THAN 100.5 F  *CHILLS WITH OR WITHOUT FEVER  NAUSEA AND VOMITING THAT IS NOT CONTROLLED WITH YOUR NAUSEA MEDICATION  *UNUSUAL SHORTNESS OF BREATH  *UNUSUAL BRUISING OR BLEEDING  TENDERNESS IN MOUTH AND THROAT WITH OR WITHOUT PRESENCE OF ULCERS  *URINARY PROBLEMS  *BOWEL PROBLEMS  UNUSUAL RASH Items with * indicate a potential emergency and should be followed up as soon as possible.  Feel free to call the clinic should you have any questions or concerns. The clinic phone number is [phone].  Please show the [place] at check-in to the Emergency Department and triage nurse.   Hypomagnesemia Hypomagnesemia is a condition in which the level of magnesium in the blood is low. Magnesium is a mineral that is found in many foods. It is used in many different processes in the body. Hypomagnesemia can affect every organ in the body. In severe cases, it can cause life-threatening problems. What are the causes? This condition may be caused by:  Not getting enough magnesium in your diet.  Malnutrition.  Problems with absorbing magnesium from the intestines.  Dehydration.  Alcohol abuse.  Vomiting.  Severe or chronic diarrhea.  Some medicines, including medicines that make you urinate more (diuretics).  Certain diseases, such as kidney disease, diabetes, celiac disease, and overactive thyroid. What are the signs or symptoms? Symptoms of this condition include:  Loss of appetite.  Nausea and  vomiting.  Involuntary shaking or trembling of a body part (tremor).  Muscle weakness.  Tingling in the arms and legs.  Sudden tightening of muscles (muscle spasms).  Confusion.  Psychiatric issues, such as depression, irritability, or psychosis.  A feeling of fluttering of the heart.  Seizures. These symptoms are more severe if magnesium levels drop suddenly. How is this diagnosed? This condition may be diagnosed based on:  Your symptoms and medical history.  A physical exam.  Blood and urine tests. How is this treated? Treatment depends on the cause and the severity of the condition. It may be treated with:  A magnesium supplement. This can be taken in pill form. If the condition is severe, magnesium is usually given through an IV.  Changes to your diet. You may be directed to eat foods that have a lot of magnesium, such as green leafy vegetables, peas, beans, and nuts.  Stopping any intake of alcohol. Follow these instructions at home:      Make sure that your diet includes foods with magnesium. Foods that have a lot of magnesium in them include: ? Green leafy vegetables, such as spinach and broccoli. ? Beans and peas. ? Nuts and seeds, such as almonds and sunflower seeds. ? Whole grains, such as whole grain bread and fortified cereals.  Take magnesium supplements if your health care provider tells you to do that. Take them as directed.  Take over-the-counter and prescription medicines only as told by your health care provider.  Have your magnesium levels monitored as told by your health care provider.  When you are active, drink fluids that contain electrolytes.  Avoid drinking alcohol.  Keep all follow-up  visits as told by your health care provider. This is important. Contact a health care provider if:  You get worse instead of better.  Your symptoms return. Get help right away if you:  Develop severe muscle weakness.  Have trouble  breathing.  Feel that your heart is racing. Summary  Hypomagnesemia is a condition in which the level of magnesium in the blood is low.  Hypomagnesemia can affect every organ in the body.  Treatment may include eating more foods that contain magnesium, taking magnesium supplements, and not drinking alcohol.  Have your magnesium levels monitored as told by your health care provider. This information is not intended to replace advice given to you by your health care provider. Make sure you discuss any questions you have with your health care provider. Document Revised: 12/30/2016 Document Reviewed: 12/19/2016 Elsevier Patient Education  2020 [employer].

## 2019-10-15 ENCOUNTER — Telehealth: Payer: Self-pay | Admitting: Nurse Practitioner

## 2019-10-15 DIAGNOSIS — L89510 Pressure ulcer of right ankle, unstageable: Secondary | ICD-10-CM | POA: Diagnosis not present

## 2019-10-15 DIAGNOSIS — L89520 Pressure ulcer of left ankle, unstageable: Secondary | ICD-10-CM | POA: Diagnosis not present

## 2019-10-15 DIAGNOSIS — L89890 Pressure ulcer of other site, unstageable: Secondary | ICD-10-CM | POA: Diagnosis not present

## 2019-10-15 NOTE — Telephone Encounter (Signed)
Scheduled appointments per 9/13 los. Patient is aware of all upcoming appointments. Patient requested for updated calendar to be printed at next visit. Request was put in next visit's notes.

## 2019-10-16 ENCOUNTER — Other Ambulatory Visit: Payer: Self-pay

## 2019-10-16 ENCOUNTER — Inpatient Hospital Stay: Payer: Medicare HMO

## 2019-10-16 VITALS — BP 123/69 | HR 76 | Temp 98.4°F | Resp 20

## 2019-10-16 DIAGNOSIS — Z5111 Encounter for antineoplastic chemotherapy: Secondary | ICD-10-CM | POA: Diagnosis not present

## 2019-10-16 DIAGNOSIS — C186 Malignant neoplasm of descending colon: Secondary | ICD-10-CM

## 2019-10-16 DIAGNOSIS — C187 Malignant neoplasm of sigmoid colon: Secondary | ICD-10-CM

## 2019-10-16 MED ORDER — HEPARIN SOD (PORK) LOCK FLUSH 100 UNIT/ML IV SOLN
500.0000 [IU] | Freq: Once | INTRAVENOUS | Status: AC | PRN
  Administered 2019-10-16: 500 [IU]
  Filled 2019-10-16: qty 5

## 2019-10-16 MED ORDER — SODIUM CHLORIDE 0.9% FLUSH
10.0000 mL | INTRAVENOUS | Status: DC | PRN
  Administered 2019-10-16: 10 mL
  Filled 2019-10-16: qty 10

## 2019-10-21 ENCOUNTER — Other Ambulatory Visit: Payer: Self-pay

## 2019-10-21 NOTE — Patient Outreach (Signed)
[place] [facility]) Care Management  10/21/2019  Monroe Qin. [birth year] [id number]   Referral Date: 10/21/19 Referral Source: [employer] Report Date of Discharge: 10/18/19 Facility:  [place]: [employer]   Referral received.  No outreach warranted at this time.  Transition of Care calls being completed via EMMI. RN CM will outreach patient for any red flags received.    Plan: RN CM will close case.    Jone Baseman, RN, MSN [facility] Care Management Care Management Coordinator Direct Line [phone] Toll Free: [phone]  Fax: [phone]

## 2019-10-22 DIAGNOSIS — L89520 Pressure ulcer of left ankle, unstageable: Secondary | ICD-10-CM | POA: Diagnosis not present

## 2019-10-22 DIAGNOSIS — L89510 Pressure ulcer of right ankle, unstageable: Secondary | ICD-10-CM | POA: Diagnosis not present

## 2019-10-22 DIAGNOSIS — L89890 Pressure ulcer of other site, unstageable: Secondary | ICD-10-CM | POA: Diagnosis not present

## 2019-10-24 DIAGNOSIS — Z13228 Encounter for screening for other metabolic disorders: Secondary | ICD-10-CM | POA: Diagnosis not present

## 2019-10-24 DIAGNOSIS — E790 Hyperuricemia without signs of inflammatory arthritis and tophaceous disease: Secondary | ICD-10-CM | POA: Diagnosis not present

## 2019-10-25 ENCOUNTER — Other Ambulatory Visit: Payer: Self-pay

## 2019-10-25 DIAGNOSIS — C187 Malignant neoplasm of sigmoid colon: Secondary | ICD-10-CM

## 2019-10-28 ENCOUNTER — Inpatient Hospital Stay: Payer: Medicare HMO

## 2019-10-28 ENCOUNTER — Encounter: Payer: Self-pay | Admitting: Nurse Practitioner

## 2019-10-28 ENCOUNTER — Other Ambulatory Visit: Payer: Self-pay

## 2019-10-28 ENCOUNTER — Other Ambulatory Visit: Payer: Self-pay | Admitting: Oncology

## 2019-10-28 ENCOUNTER — Inpatient Hospital Stay (HOSPITAL_BASED_OUTPATIENT_CLINIC_OR_DEPARTMENT_OTHER): Payer: Medicare HMO | Admitting: Nurse Practitioner

## 2019-10-28 VITALS — BP 90/66 | HR 90 | Temp 97.7°F | Resp 19 | Ht 74.0 in

## 2019-10-28 DIAGNOSIS — Z95828 Presence of other vascular implants and grafts: Secondary | ICD-10-CM

## 2019-10-28 DIAGNOSIS — C187 Malignant neoplasm of sigmoid colon: Secondary | ICD-10-CM

## 2019-10-28 DIAGNOSIS — C186 Malignant neoplasm of descending colon: Secondary | ICD-10-CM

## 2019-10-28 DIAGNOSIS — Z5111 Encounter for antineoplastic chemotherapy: Secondary | ICD-10-CM | POA: Diagnosis not present

## 2019-10-28 LAB — CBC WITH DIFFERENTIAL (CANCER CENTER ONLY)
Abs Immature Granulocytes: 0.03 10*3/uL (ref 0.00–0.07)
Basophils Absolute: 0.1 10*3/uL (ref 0.0–0.1)
Basophils Relative: 1 %
Eosinophils Absolute: 0.2 10*3/uL (ref 0.0–0.5)
Eosinophils Relative: 2 %
HCT: 27.9 % — ABNORMAL LOW (ref 39.0–52.0)
Hemoglobin: 8.5 g/dL — ABNORMAL LOW (ref 13.0–17.0)
Immature Granulocytes: 0 %
Lymphocytes Relative: 17 %
Lymphs Abs: 1.8 10*3/uL (ref 0.7–4.0)
MCH: 28 pg (ref 26.0–34.0)
MCHC: 30.5 g/dL (ref 30.0–36.0)
MCV: 91.8 fL (ref 80.0–100.0)
Monocytes Absolute: 0.8 10*3/uL (ref 0.1–1.0)
Monocytes Relative: 8 %
Neutro Abs: 7.2 10*3/uL (ref 1.7–7.7)
Neutrophils Relative %: 72 %
Platelet Count: 394 10*3/uL (ref 150–400)
RBC: 3.04 MIL/uL — ABNORMAL LOW (ref 4.22–5.81)
RDW: 14.7 % (ref 11.5–15.5)
WBC Count: 10.1 10*3/uL (ref 4.0–10.5)
nRBC: 0 % (ref 0.0–0.2)

## 2019-10-28 LAB — MAGNESIUM: Magnesium: 1.5 mg/dL — ABNORMAL LOW (ref 1.7–2.4)

## 2019-10-28 LAB — CBC (CANCER CENTER ONLY)
HCT: 27.5 % — ABNORMAL LOW (ref 39.0–52.0)
Hemoglobin: 8.5 g/dL — ABNORMAL LOW (ref 13.0–17.0)
MCH: 28.2 pg (ref 26.0–34.0)
MCHC: 30.9 g/dL (ref 30.0–36.0)
MCV: 91.4 fL (ref 80.0–100.0)
Platelet Count: 355 10*3/uL (ref 150–400)
RBC: 3.01 MIL/uL — ABNORMAL LOW (ref 4.22–5.81)
RDW: 14.5 % (ref 11.5–15.5)
WBC Count: 10.3 10*3/uL (ref 4.0–10.5)
nRBC: 0 % (ref 0.0–0.2)

## 2019-10-28 LAB — CMP (CANCER CENTER ONLY)
ALT: 19 U/L (ref 0–44)
AST: 22 U/L (ref 15–41)
Albumin: 2.5 g/dL — ABNORMAL LOW (ref 3.5–5.0)
Alkaline Phosphatase: 151 U/L — ABNORMAL HIGH (ref 38–126)
Anion gap: 10 (ref 5–15)
BUN: 26 mg/dL — ABNORMAL HIGH (ref 8–23)
CO2: 28 mmol/L (ref 22–32)
Calcium: 9.3 mg/dL (ref 8.9–10.3)
Chloride: 103 mmol/L (ref 98–111)
Creatinine: 0.99 mg/dL (ref 0.61–1.24)
GFR, Est AFR Am: >60 mL/min (ref >60)
GFR, Estimated: >60 mL/min (ref >60)
Glucose, Bld: 128 mg/dL — ABNORMAL HIGH (ref 70–99)
Potassium: 4.2 mmol/L (ref 3.5–5.1)
Sodium: 141 mmol/L (ref 135–145)
Total Bilirubin: 0.4 mg/dL (ref 0.3–1.2)
Total Protein: 6.6 g/dL (ref 6.5–8.1)

## 2019-10-28 MED ORDER — SODIUM CHLORIDE 0.9 % IV SOLN
1600.0000 mg/m2 | INTRAVENOUS | Status: DC
  Administered 2019-10-28: 3300 mg via INTRAVENOUS
  Filled 2019-10-28: qty 66

## 2019-10-28 MED ORDER — SODIUM CHLORIDE 0.9% FLUSH
10.0000 mL | Freq: Once | INTRAVENOUS | Status: DC
  Filled 2019-10-28: qty 10

## 2019-10-28 MED ORDER — MAGNESIUM SULFATE 2 GM/50ML IV SOLN
2.0000 g | Freq: Once | INTRAVENOUS | Status: AC
  Administered 2019-10-28: 2 g via INTRAVENOUS

## 2019-10-28 MED ORDER — MAGNESIUM SULFATE 2 GM/50ML IV SOLN
INTRAVENOUS | Status: AC
  Filled 2019-10-28: qty 50

## 2019-10-28 NOTE — Patient Instructions (Signed)
[place] Discharge Instructions for Patients Receiving Chemotherapy  Today you received the following chemotherapy agents Adrucil   To help prevent nausea and vomiting after your treatment, we encourage you to take your nausea medication as directed.    If you develop nausea and vomiting that is not controlled by your nausea medication, call the clinic.   BELOW ARE SYMPTOMS THAT SHOULD BE REPORTED IMMEDIATELY:  *FEVER GREATER THAN 100.5 F  *CHILLS WITH OR WITHOUT FEVER  NAUSEA AND VOMITING THAT IS NOT CONTROLLED WITH YOUR NAUSEA MEDICATION  *UNUSUAL SHORTNESS OF BREATH  *UNUSUAL BRUISING OR BLEEDING  TENDERNESS IN MOUTH AND THROAT WITH OR WITHOUT PRESENCE OF ULCERS  *URINARY PROBLEMS  *BOWEL PROBLEMS  UNUSUAL RASH Items with * indicate a potential emergency and should be followed up as soon as possible.  Feel free to call the clinic should you have any questions or concerns. The clinic phone number is [phone].  Please show the [place] at check-in to the Emergency Department and triage nurse.   Hypomagnesemia Hypomagnesemia is a condition in which the level of magnesium in the blood is low. Magnesium is a mineral that is found in many foods. It is used in many different processes in the body. Hypomagnesemia can affect every organ in the body. In severe cases, it can cause life-threatening problems. What are the causes? This condition may be caused by:  Not getting enough magnesium in your diet.  Malnutrition.  Problems with absorbing magnesium from the intestines.  Dehydration.  Alcohol abuse.  Vomiting.  Severe or chronic diarrhea.  Some medicines, including medicines that make you urinate more (diuretics).  Certain diseases, such as kidney disease, diabetes, celiac disease, and overactive thyroid. What are the signs or symptoms? Symptoms of this condition include:  Loss of appetite.  Nausea and vomiting.  Involuntary  shaking or trembling of a body part (tremor).  Muscle weakness.  Tingling in the arms and legs.  Sudden tightening of muscles (muscle spasms).  Confusion.  Psychiatric issues, such as depression, irritability, or psychosis.  A feeling of fluttering of the heart.  Seizures. These symptoms are more severe if magnesium levels drop suddenly. How is this diagnosed? This condition may be diagnosed based on:  Your symptoms and medical history.  A physical exam.  Blood and urine tests. How is this treated? Treatment depends on the cause and the severity of the condition. It may be treated with:  A magnesium supplement. This can be taken in pill form. If the condition is severe, magnesium is usually given through an IV.  Changes to your diet. You may be directed to eat foods that have a lot of magnesium, such as green leafy vegetables, peas, beans, and nuts.  Stopping any intake of alcohol. Follow these instructions at home:      Make sure that your diet includes foods with magnesium. Foods that have a lot of magnesium in them include: ? Green leafy vegetables, such as spinach and broccoli. ? Beans and peas. ? Nuts and seeds, such as almonds and sunflower seeds. ? Whole grains, such as whole grain bread and fortified cereals.  Take magnesium supplements if your health care provider tells you to do that. Take them as directed.  Take over-the-counter and prescription medicines only as told by your health care provider.  Have your magnesium levels monitored as told by your health care provider.  When you are active, drink fluids that contain electrolytes.  Avoid drinking alcohol.  Keep  all follow-up visits as told by your health care provider. This is important. Contact a health care provider if:  You get worse instead of better.  Your symptoms return. Get help right away if you:  Develop severe muscle weakness.  Have trouble breathing.  Feel that your heart is  racing. Summary  Hypomagnesemia is a condition in which the level of magnesium in the blood is low.  Hypomagnesemia can affect every organ in the body.  Treatment may include eating more foods that contain magnesium, taking magnesium supplements, and not drinking alcohol.  Have your magnesium levels monitored as told by your health care provider. This information is not intended to replace advice given to you by your health care provider. Make sure you discuss any questions you have with your health care provider. Document Revised: 12/30/2016 Document Reviewed: 12/19/2016 Elsevier Patient Education  2020 [employer].

## 2019-10-28 NOTE — Patient Instructions (Signed)

## 2019-10-28 NOTE — Progress Notes (Signed)
Mg = 1.5 today.  Infusion has time for Mg Sulf 2 g IV over 1 hour. Orders entered at request of Ned Card, NP.  Kennith Center, Pharm.D., CPP 10/28/2019@11 :40 AM

## 2019-10-28 NOTE — Progress Notes (Addendum)
[facility] OFFICE PROGRESS NOTE   Diagnosis: Colon cancer  INTERVAL HISTORY:   Clarence Andrews returns as scheduled.  He completed a cycle of 5-fluorouracil 10/14/2019.  He denies nausea/vomiting.  No mouth sores.  No diarrhea.  No hand or foot pain or redness.  Objective:  Vital signs in last 24 hours:  Pulse 90, temperature 97.7 F (36.5 C), temperature source Tympanic, resp. rate 19, height 6' 2"  (1.88 m), SpO2 100 %.    HEENT: No thrush or ulcers. Resp: Lungs clear bilaterally. Cardio: Distant heart sounds, regular. GI: Abdomen soft and nontender.  No hepatomegaly. Vascular: No leg edema. Skin: Palms without erythema. Port-A-Cath without erythema.   Lab Results:  Lab Results  Component Value Date   WBC 10.3 10/28/2019   HGB 8.5 (L) 10/28/2019   HCT 27.5 (L) 10/28/2019   MCV 91.4 10/28/2019   PLT 355 10/28/2019   NEUTROABS 7.5 10/14/2019    Imaging:  No results found.  Medications: I have reviewed the patient's current medications.  Assessment/Plan: 1. 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 Colonoscopy 03/07/2019-nearly completely obstructing mass beginning at 16 cm from the anal verge, sigmoid colon polyp ? CTs 03/15/2019-wall thickening of the lower sigmoid colon, low rectal mass with significant luminal narrowing, diffuse liver metastases, borderline enlarged sigmoid mesocolon, iliac, and upper abdominal lymph nodes ? Elevated CEA ? 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 Cycle 1 FOLFOX 04/11/2019 ? Cycle 2 FOLFOX 05/28/2019 ? Cycle 3 FOLFOX 06/11/2019 ? Cycle 4 FOLFOX 06/25/2019 ? CT abdomen/pelvis 07/10/2019-severe obstructing stricture at the sigmoid colon, stable to slight improvement in hepatic metastatic disease compared to February 2021 ? Cycle 5 FOLFOX  07/31/2019 ? Cycle6FOLFOX 08/13/2019 ? Cycle 7 FOLFOX 08/27/2019 ? CT abdomen/pelvis 09/12/2019-stable and slightly decreased hepatic lesions, no evidence of progressive metastatic disease, slight enlargement of a previously noted pancreas head/uncinate lesion, no evidence of colonic obstruction, stable small mesenteric and retroperitoneal nodes ? Cycle8FOLFOX 09/26/2019, oxaliplatin held secondary to persistent orthostatic hypotension ? Cycle 9 FOLFOX 10/14/2019, oxaliplatin on hold secondary to persistent orthostatic hypotension ? Cycle 10 FOLFOX 10/28/2019, oxaliplatin on hold secondary to persistent orthostatic hypotension 2. 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 Diabetes 4. Coronary artery disease, status post coronary artery bypass surgery in 2017 5. Neutropenia following cycle 1 FOLFOX 6. Admission 04/28/2019 with COVID-19 infection 7. C. difficile colitis 04/28/2019 8. Atrial flutter during hospital admission March/April 2021-treated with a Cardizem drip, digoxin, and Eliquis anticoagulation. Converted to metoprolol at discharge.  Admission with recurrent rapid atrial fibrillation/flutter 07/09/2019 9. 07/09/2019-hospital admission for bowel obstruction  Sigmoidoscopy 07/12/2019-completely obstructing mass at 17-20 cm proximal to the anus-oozing present, stent placed 10. Fall 07/24/2019 with acute kidney injury/rhabdomyolysis 11. 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 13.Right foot drop is likely due to weight loss/nerve compression 14. C. 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  He has completed 9 cycles of FOLFOX.  Oxaliplatin on hold due to persistent orthostatic hypotension.  Plan to proceed with cycle 10 FOLFOX today as scheduled, hold  oxaliplatin.  We reviewed the CBC and chemistry panel from today.  Labs appear adequate to proceed with treatment pending CBC differential.  Hemoglobin overall is stable.  He has persistent hypomagnesemia.  He continues oral magnesium.  He will receive IV magnesium today as well.  He will return for lab, follow-up, chemotherapy in 2 weeks.  He will contact the office in the interim with any problems.    Ned Card ANP/GNP-BC   10/28/2019  11:02 AM

## 2019-10-29 ENCOUNTER — Telehealth: Payer: Self-pay | Admitting: Nurse Practitioner

## 2019-10-29 NOTE — Telephone Encounter (Signed)
Scheduled appointment per 9/27 los. Spoke to patient who is aware of appointment date and time.

## 2019-10-30 ENCOUNTER — Inpatient Hospital Stay: Payer: Medicare HMO

## 2019-10-30 ENCOUNTER — Other Ambulatory Visit: Payer: Self-pay

## 2019-10-30 DIAGNOSIS — Z95828 Presence of other vascular implants and grafts: Secondary | ICD-10-CM

## 2019-10-30 DIAGNOSIS — Z5111 Encounter for antineoplastic chemotherapy: Secondary | ICD-10-CM | POA: Diagnosis not present

## 2019-10-30 MED ORDER — HEPARIN SOD (PORK) LOCK FLUSH 100 UNIT/ML IV SOLN
500.0000 [IU] | Freq: Once | INTRAVENOUS | Status: DC
  Filled 2019-10-30: qty 5

## 2019-10-30 MED ORDER — SODIUM CHLORIDE 0.9% FLUSH
10.0000 mL | Freq: Once | INTRAVENOUS | Status: DC
  Filled 2019-10-30: qty 10

## 2019-10-30 MED ORDER — SODIUM CHLORIDE 0.9% FLUSH
10.0000 mL | Freq: Once | INTRAVENOUS | Status: AC
  Administered 2019-10-30: 10 mL
  Filled 2019-10-30: qty 10

## 2019-10-30 MED ORDER — HEPARIN SOD (PORK) LOCK FLUSH 100 UNIT/ML IV SOLN
500.0000 [IU] | Freq: Once | INTRAVENOUS | Status: AC
  Administered 2019-10-30: 500 [IU] via INTRAVENOUS
  Filled 2019-10-30: qty 5

## 2019-10-30 MED ORDER — SODIUM CHLORIDE 0.9% FLUSH
10.0000 mL | INTRAVENOUS | Status: DC | PRN
  Filled 2019-10-30: qty 10

## 2019-10-30 NOTE — Patient Instructions (Signed)

## 2019-11-04 ENCOUNTER — Other Ambulatory Visit: Payer: Self-pay | Admitting: Oncology

## 2019-11-11 ENCOUNTER — Inpatient Hospital Stay (HOSPITAL_BASED_OUTPATIENT_CLINIC_OR_DEPARTMENT_OTHER): Payer: Medicare Other | Admitting: Oncology

## 2019-11-11 ENCOUNTER — Inpatient Hospital Stay: Payer: Medicare Other

## 2019-11-11 ENCOUNTER — Other Ambulatory Visit: Payer: Self-pay

## 2019-11-11 ENCOUNTER — Inpatient Hospital Stay: Payer: Medicare Other | Attending: Oncology

## 2019-11-11 ENCOUNTER — Other Ambulatory Visit: Payer: Self-pay | Admitting: *Deleted

## 2019-11-11 VITALS — BP 72/56 | HR 103 | Temp 97.5°F | Resp 18 | Ht 74.0 in

## 2019-11-11 VITALS — BP 142/80 | HR 86 | Temp 98.2°F | Resp 18

## 2019-11-11 DIAGNOSIS — C187 Malignant neoplasm of sigmoid colon: Secondary | ICD-10-CM

## 2019-11-11 DIAGNOSIS — Z7901 Long term (current) use of anticoagulants: Secondary | ICD-10-CM | POA: Insufficient documentation

## 2019-11-11 DIAGNOSIS — C186 Malignant neoplasm of descending colon: Secondary | ICD-10-CM

## 2019-11-11 DIAGNOSIS — I959 Hypotension, unspecified: Secondary | ICD-10-CM | POA: Insufficient documentation

## 2019-11-11 DIAGNOSIS — C19 Malignant neoplasm of rectosigmoid junction: Secondary | ICD-10-CM | POA: Diagnosis present

## 2019-11-11 DIAGNOSIS — C787 Secondary malignant neoplasm of liver and intrahepatic bile duct: Secondary | ICD-10-CM | POA: Diagnosis present

## 2019-11-11 DIAGNOSIS — Z5111 Encounter for antineoplastic chemotherapy: Secondary | ICD-10-CM | POA: Diagnosis present

## 2019-11-11 DIAGNOSIS — I4892 Unspecified atrial flutter: Secondary | ICD-10-CM | POA: Diagnosis not present

## 2019-11-11 LAB — CBC WITH DIFFERENTIAL (CANCER CENTER ONLY)
Abs Immature Granulocytes: 0.06 10*3/uL (ref 0.00–0.07)
Basophils Absolute: 0 10*3/uL (ref 0.0–0.1)
Basophils Relative: 0 %
Eosinophils Absolute: 0.1 10*3/uL (ref 0.0–0.5)
Eosinophils Relative: 1 %
HCT: 22.8 % — ABNORMAL LOW (ref 39.0–52.0)
Hemoglobin: 7 g/dL — ABNORMAL LOW (ref 13.0–17.0)
Immature Granulocytes: 1 %
Lymphocytes Relative: 7 %
Lymphs Abs: 0.7 10*3/uL (ref 0.7–4.0)
MCH: 27.5 pg (ref 26.0–34.0)
MCHC: 30.7 g/dL (ref 30.0–36.0)
MCV: 89.4 fL (ref 80.0–100.0)
Monocytes Absolute: 0.9 10*3/uL (ref 0.1–1.0)
Monocytes Relative: 8 %
Neutro Abs: 8.8 10*3/uL — ABNORMAL HIGH (ref 1.7–7.7)
Neutrophils Relative %: 83 %
Platelet Count: 271 10*3/uL (ref 150–400)
RBC: 2.55 MIL/uL — ABNORMAL LOW (ref 4.22–5.81)
RDW: 15.4 % (ref 11.5–15.5)
WBC Count: 10.5 10*3/uL (ref 4.0–10.5)
nRBC: 0 % (ref 0.0–0.2)

## 2019-11-11 LAB — CMP (CANCER CENTER ONLY)
ALT: 21 U/L (ref 0–44)
AST: 25 U/L (ref 15–41)
Albumin: 2.5 g/dL — ABNORMAL LOW (ref 3.5–5.0)
Alkaline Phosphatase: 133 U/L — ABNORMAL HIGH (ref 38–126)
Anion gap: 8 (ref 5–15)
BUN: 26 mg/dL — ABNORMAL HIGH (ref 8–23)
CO2: 28 mmol/L (ref 22–32)
Calcium: 9.2 mg/dL (ref 8.9–10.3)
Chloride: 102 mmol/L (ref 98–111)
Creatinine: 1.14 mg/dL (ref 0.61–1.24)
GFR, Estimated: >60 mL/min (ref >60)
Glucose, Bld: 236 mg/dL — ABNORMAL HIGH (ref 70–99)
Potassium: 4.1 mmol/L (ref 3.5–5.1)
Sodium: 138 mmol/L (ref 135–145)
Total Bilirubin: 0.4 mg/dL (ref 0.3–1.2)
Total Protein: 6.4 g/dL — ABNORMAL LOW (ref 6.5–8.1)

## 2019-11-11 LAB — SAMPLE TO BLOOD BANK

## 2019-11-11 LAB — MAGNESIUM: Magnesium: 1.5 mg/dL — ABNORMAL LOW (ref 1.7–2.4)

## 2019-11-11 LAB — PREPARE RBC (CROSSMATCH)

## 2019-11-11 LAB — ABO/RH: ABO/RH(D): B POS

## 2019-11-11 LAB — CEA (IN HOUSE-CHCC): CEA (CHCC-In House): 105.15 ng/mL — ABNORMAL HIGH (ref 0.00–5.00)

## 2019-11-11 MED ORDER — DIPHENHYDRAMINE HCL 25 MG PO CAPS
ORAL_CAPSULE | ORAL | Status: AC
  Filled 2019-11-11: qty 1

## 2019-11-11 MED ORDER — FLUDROCORTISONE ACETATE 0.1 MG PO TABS: 0.1000 mg | ORAL_TABLET | Freq: Every day | ORAL | 0 refills | Status: DC

## 2019-11-11 MED ORDER — SODIUM CHLORIDE 0.9 % IV SOLN
1600.0000 mg/m2 | INTRAVENOUS | Status: DC
  Administered 2019-11-11: 3300 mg via INTRAVENOUS
  Filled 2019-11-11: qty 66

## 2019-11-11 MED ORDER — DIPHENHYDRAMINE HCL 25 MG PO CAPS
25.0000 mg | ORAL_CAPSULE | Freq: Once | ORAL | Status: AC
  Administered 2019-11-11: 25 mg via ORAL

## 2019-11-11 MED ORDER — ACETAMINOPHEN 325 MG PO TABS
650.0000 mg | ORAL_TABLET | Freq: Once | ORAL | Status: AC
  Administered 2019-11-11: 650 mg via ORAL

## 2019-11-11 MED ORDER — SODIUM CHLORIDE 0.9% IV SOLUTION
250.0000 mL | Freq: Once | INTRAVENOUS | Status: AC
  Administered 2019-11-11: 250 mL via INTRAVENOUS
  Filled 2019-11-11: qty 250

## 2019-11-11 MED ORDER — ACETAMINOPHEN 325 MG PO TABS
ORAL_TABLET | ORAL | Status: AC
  Filled 2019-11-11: qty 2

## 2019-11-11 NOTE — Progress Notes (Deleted)
[facility] OFFICE PROGRESS NOTE   Diagnosis:   INTERVAL HISTORY:   ***  Objective:  Vital signs in last 24 hours:  Blood pressure (!) 72/56, pulse (!) 103, temperature (!) 97.5 F (36.4 C), temperature source Tympanic, resp. rate 18, height 6' 2"  (1.88 m), SpO2 100 %.    HEENT: *** Lymphatics: *** Resp: *** Cardio: *** GI: *** Vascular: *** Neuro:***  Skin:***   Portacath/PICC-without erythema  Lab Results:  Lab Results  Component Value Date   WBC 10.3 10/28/2019   WBC 10.1 10/28/2019   HGB 8.5 (L) 10/28/2019   HGB 8.5 (L) 10/28/2019   HCT 27.5 (L) 10/28/2019   HCT 27.9 (L) 10/28/2019   MCV 91.4 10/28/2019   MCV 91.8 10/28/2019   PLT 355 10/28/2019   PLT 394 10/28/2019   NEUTROABS 7.2 10/28/2019    CMP  Lab Results  Component Value Date   NA 141 10/28/2019   K 4.2 10/28/2019   CL 103 10/28/2019   CO2 28 10/28/2019   GLUCOSE 128 (H) 10/28/2019   BUN 26 (H) 10/28/2019   CREATININE 0.99 10/28/2019   CALCIUM 9.3 10/28/2019   PROT 6.6 10/28/2019   ALBUMIN 2.5 (L) 10/28/2019   AST 22 10/28/2019   ALT 19 10/28/2019   ALKPHOS 151 (H) 10/28/2019   BILITOT 0.4 10/28/2019   GFRNONAA >60 10/28/2019   GFRAA >60 10/28/2019    Lab Results  Component Value Date   CEA1 94.06 (H) 09/26/2019     Medications: I have reviewed the patient's current medications.   Assessment/Plan: 1. 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 Colonoscopy 03/07/2019-nearly completely obstructing mass beginning at 16 cm from the anal verge, sigmoid colon polyp ? CTs 03/15/2019-wall thickening of the lower sigmoid colon, low rectal mass with significant luminal narrowing, diffuse liver metastases, borderline enlarged sigmoid mesocolon, iliac, and upper abdominal lymph nodes ? Elevated CEA ? 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 Cycle 1 FOLFOX 04/11/2019 ? Cycle 2 FOLFOX 05/28/2019 ? Cycle 3 FOLFOX 06/11/2019 ? Cycle 4 FOLFOX 06/25/2019 ? CT abdomen/pelvis 07/10/2019-severe obstructing stricture at the sigmoid colon, stable to slight improvement in hepatic metastatic disease compared to February 2021 ? Cycle 5 FOLFOX 07/31/2019 ? Cycle6FOLFOX 08/13/2019 ? Cycle 7 FOLFOX 08/27/2019 ? CT abdomen/pelvis 09/12/2019-stable and slightly decreased hepatic lesions, no evidence of progressive metastatic disease, slight enlargement of a previously noted pancreas head/uncinate lesion, no evidence of colonic obstruction, stable small mesenteric and retroperitoneal nodes ? Cycle8FOLFOX 09/26/2019, oxaliplatin held secondary to persistent orthostatic hypotension ? Cycle 9 FOLFOX 10/14/2019, oxaliplatin on hold secondary to persistent orthostatic hypotension ? Cycle 10 FOLFOX 10/28/2019, oxaliplatin on hold secondary to persistent orthostatic hypotension 2. 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 Diabetes 4. Coronary artery disease, status post coronary artery bypass surgery in 2017 5. Neutropenia following cycle 1 FOLFOX 6. Admission 04/28/2019 with COVID-19 infection 7. C. difficile colitis 04/28/2019 8. Atrial flutter during hospital admission March/April 2021-treated with a Cardizem drip, digoxin, and Eliquis anticoagulation. Converted to metoprolol at discharge.  Admission with recurrent rapid atrial fibrillation/flutter 07/09/2019 9. 07/09/2019-hospital admission for bowel obstruction  Sigmoidoscopy 07/12/2019-completely obstructing mass at 17-20 cm proximal to the anus-oozing present, stent placed 10. Fall 07/24/2019 with acute kidney injury/rhabdomyolysis 11. Anemia secondary to chronic disease, malnutrition, phlebotomy, GI bleeding 12.Orthostatic hypotension-on midodrine  3 times  daily. 13.Right foot drop is likely due to weight loss/nerve compression 14. C. 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

## 2019-11-11 NOTE — Progress Notes (Signed)
[facility] OFFICE PROGRESS NOTE   Diagnosis: Colon cancer  INTERVAL HISTORY:   Mr. Smethurst returns as scheduled.  He completed another cycle of 5-fluorouracil on 10/28/2019.  No diarrhea or bleeding.  He continues physical therapy, though he remains bedbound due to orthostasis.  He reports a good appetite.  No dyspnea.  Objective:  Vital signs in last 24 hours:  Blood pressure (!) 72/56, pulse (!) 103, temperature (!) 97.5 F (36.4 C), temperature source Tympanic, resp. rate 18, height _0  (1.88 m), SpO2 100 %.    HEENT: No thrush or ulcers, mucous membranes are moist Resp: Lungs clear bilaterally Cardio: Regular rate and rhythm GI: No hepatomegaly, nontender Vascular: No leg edema Neuro: The motor exam appears intact in the legs bilaterally Skin: Dryness over the trunk and extremities  Portacath/PICC-without erythema  Lab Results:  Lab Results  Component Value Date   WBC 10.3 10/28/2019   WBC 10.1 10/28/2019   HGB 8.5 (L) 10/28/2019   HGB 8.5 (L) 10/28/2019   HCT 27.5 (L) 10/28/2019   HCT 27.9 (L) 10/28/2019   MCV 91.4 10/28/2019   MCV 91.8 10/28/2019   PLT 355 10/28/2019   PLT 394 10/28/2019   NEUTROABS 7.2 10/28/2019    CMP  Lab Results  Component Value Date   NA 141 10/28/2019   K 4.2 10/28/2019   CL 103 10/28/2019   CO2 28 10/28/2019   GLUCOSE 128 (H) 10/28/2019   BUN 26 (H) 10/28/2019   CREATININE 0.99 10/28/2019   CALCIUM 9.3 10/28/2019   PROT 6.6 10/28/2019   ALBUMIN 2.5 (L) 10/28/2019   AST 22 10/28/2019   ALT 19 10/28/2019   ALKPHOS 151 (H) 10/28/2019   BILITOT 0.4 10/28/2019   GFRNONAA >60 10/28/2019   GFRAA >60 10/28/2019    Lab Results  Component Value Date   CEA1 94.06 (H) 09/26/2019     Medications: I have reviewed the patient's current medications.   Assessment/Plan: 1. 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 Colonoscopy 03/07/2019-nearly completely obstructing mass  beginning at 16 cm from the anal verge, sigmoid colon polyp ? CTs 03/15/2019-wall thickening of the lower sigmoid colon, low rectal mass with significant luminal narrowing, diffuse liver metastases, borderline enlarged sigmoid mesocolon, iliac, and upper abdominal lymph nodes ? Elevated CEA ? 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 Cycle 1 FOLFOX 04/11/2019 ? Cycle 2 FOLFOX 05/28/2019 ? Cycle 3 FOLFOX 06/11/2019 ? Cycle 4 FOLFOX 06/25/2019 ? CT abdomen/pelvis 07/10/2019-severe obstructing stricture at the sigmoid colon, stable to slight improvement in hepatic metastatic disease compared to February 2021 ? Cycle 5 FOLFOX 07/31/2019 ? Cycle6FOLFOX 08/13/2019 ? Cycle 7 FOLFOX 08/27/2019 ? CT abdomen/pelvis 09/12/2019-stable and slightly decreased hepatic lesions, no evidence of progressive metastatic disease, slight enlargement of a previously noted pancreas head/uncinate lesion, no evidence of colonic obstruction, stable small mesenteric and retroperitoneal nodes ? Cycle8FOLFOX 09/26/2019, oxaliplatin held secondary to persistent orthostatic hypotension ? Cycle 9 FOLFOX 10/14/2019, oxaliplatin on hold secondary to persistent orthostatic hypotension ? Cycle 10 FOLFOX 10/28/2019, oxaliplatin on hold secondary to persistent orthostatic hypotension ? Cycle 11 FOLFOX 11/11/2019, oxaliplatin on hold secondary to persistent orthostatic hypotension 2. 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 Diabetes 4. Coronary artery disease, status post coronary artery bypass surgery in 2017 5. Neutropenia following cycle 1 FOLFOX 6.  Admission 04/28/2019 with COVID-19 infection 7. C. difficile colitis 04/28/2019 8. Atrial flutter during hospital admission March/April 2021-treated  with a Cardizem drip, digoxin, and Eliquis anticoagulation. Converted to metoprolol at discharge.  Admission with recurrent rapid atrial fibrillation/flutter 07/09/2019 9. 07/09/2019-hospital admission for bowel obstruction  Sigmoidoscopy 07/12/2019-completely obstructing mass at 17-20 cm proximal to the anus-oozing present, stent placed 10. Fall 07/24/2019 with acute kidney injury/rhabdomyolysis 11. 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 13.Right foot drop is likely due to weight loss/nerve compression 14. C. 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 Douthit appears unchanged.  He continues to have hypotension.  We will repeat orthostatics in the chemotherapy room today.  I will add Florinef if he remains orthostatic.  I suspect he has orthostatic hypotension secondary to toxicity from oxaliplatin.  The initial hemoglobin from labs returned at less than 7.  He denies bleeding.  We will repeat a CBC.  The plan is to transfuse packed red blood cells if the hemoglobin returns again at less than 7.  I reviewed potential toxicities associated with a red cell transfusion and he agrees to proceed.  Mr. Purdum will return for an office visit and chemotherapy in 2 weeks.  He will undergo a restaging CT evaluation after the next cycle of chemotherapy.  Betsy Coder, MD  11/11/2019  10:49 AM

## 2019-11-11 NOTE — Progress Notes (Signed)
Ok to treat per Dr [name]. Pt will receive 1 unit of pRBCs during infusion today.

## 2019-11-12 ENCOUNTER — Other Ambulatory Visit: Payer: Self-pay | Admitting: *Deleted

## 2019-11-12 ENCOUNTER — Telehealth: Payer: Self-pay | Admitting: *Deleted

## 2019-11-12 ENCOUNTER — Telehealth: Payer: Self-pay | Admitting: Oncology

## 2019-11-12 DIAGNOSIS — C187 Malignant neoplasm of sigmoid colon: Secondary | ICD-10-CM

## 2019-11-12 LAB — BPAM RBC
Blood Product Expiration Date: 202111102359
ISSUE DATE / TIME: 202110111248
Unit Type and Rh: 7300

## 2019-11-12 LAB — TYPE AND SCREEN
ABO/RH(D): B POS
Antibody Screen: NEGATIVE
Unit division: 0

## 2019-11-12 NOTE — Telephone Encounter (Signed)
Scheduled appointments per 10/11 los. Spoke to patient who is aware of appointments dates and times.  

## 2019-11-12 NOTE — Telephone Encounter (Signed)
Called facility where pt resides to make aware that pt will have an additional 2hrs for IVF/mag added after pump d/c. Due to low magnesium levels. Facility verbalized understanding.

## 2019-11-13 ENCOUNTER — Inpatient Hospital Stay: Payer: Medicare Other

## 2019-11-13 ENCOUNTER — Other Ambulatory Visit: Payer: Self-pay

## 2019-11-13 DIAGNOSIS — C186 Malignant neoplasm of descending colon: Secondary | ICD-10-CM

## 2019-11-13 DIAGNOSIS — C187 Malignant neoplasm of sigmoid colon: Secondary | ICD-10-CM

## 2019-11-13 DIAGNOSIS — Z5111 Encounter for antineoplastic chemotherapy: Secondary | ICD-10-CM | POA: Diagnosis not present

## 2019-11-13 MED ORDER — HEPARIN SOD (PORK) LOCK FLUSH 100 UNIT/ML IV SOLN
500.0000 [IU] | Freq: Every day | INTRAVENOUS | Status: AC | PRN
  Administered 2019-11-13: 500 [IU]
  Filled 2019-11-13: qty 5

## 2019-11-13 MED ORDER — SODIUM CHLORIDE 0.9% FLUSH
10.0000 mL | INTRAVENOUS | Status: DC | PRN
  Filled 2019-11-13: qty 10

## 2019-11-13 MED ORDER — SODIUM CHLORIDE 0.9% IV SOLUTION
Freq: Once | INTRAVENOUS | Status: AC
  Filled 2019-11-13: qty 1000

## 2019-11-13 MED ORDER — MAGNESIUM SULFATE 2 GM/50ML IV SOLN
INTRAVENOUS | Status: AC
  Filled 2019-11-13: qty 50

## 2019-11-13 MED ORDER — SODIUM CHLORIDE 0.9% FLUSH
10.0000 mL | INTRAVENOUS | Status: DC | PRN
  Administered 2019-11-13: 10 mL
  Filled 2019-11-13: qty 10

## 2019-11-13 MED ORDER — MAGNESIUM SULFATE 2 GM/50ML IV SOLN
2.0000 g | Freq: Once | INTRAVENOUS | Status: AC
  Administered 2019-11-13: 2 g via INTRAVENOUS

## 2019-11-13 MED ORDER — HEPARIN SOD (PORK) LOCK FLUSH 100 UNIT/ML IV SOLN
500.0000 [IU] | Freq: Once | INTRAVENOUS | Status: DC | PRN
  Filled 2019-11-13: qty 5

## 2019-11-13 NOTE — Patient Instructions (Signed)
Magnesium Sulfate injection What is this medicine? MAGNESIUM SULFATE (mag NEE zee um SUL fate) is an electrolyte injection commonly used to treat low magnesium levels in your blood. It is also used to prevent or control seizures in women with preeclampsia or eclampsia. This medicine may be used for other purposes; ask your health care provider or pharmacist if you have questions. What should I tell my health care provider before I take this medicine? They need to know if you have any of these conditions:  heart disease  history of irregular heart beat  kidney disease  an unusual or allergic reaction to magnesium sulfate, medicines, foods, dyes, or preservatives  pregnant or trying to get pregnant  breast-feeding How should I use this medicine? This medicine is for infusion into a vein. It is given by a health care professional in a hospital or clinic setting. Talk to your pediatrician regarding the use of this medicine in children. While this drug may be prescribed for selected conditions, precautions do apply. Overdosage: If you think you have taken too much of this medicine contact a poison control center or emergency room at once. NOTE: This medicine is only for you. Do not share this medicine with others. What if I miss a dose? This does not apply. What may interact with this medicine? This medicine may interact with the following medications:  certain medicines for anxiety or sleep  certain medicines for seizures like phenobarbital  digoxin  medicines that relax muscles for surgery  narcotic medicines for pain This list may not describe all possible interactions. Give your health care provider a list of all the medicines, herbs, non-prescription drugs, or dietary supplements you use. Also tell them if you smoke, drink alcohol, or use illegal drugs. Some items may interact with your medicine. What should I watch for while using this medicine? Your condition will be  monitored carefully while you are receiving this medicine. You may need blood work done while you are receiving this medicine. What side effects may I notice from receiving this medicine? Side effects that you should report to your doctor or health care professional as soon as possible:  allergic reactions like skin rash, itching or hives, swelling of the face, lips, or tongue  facial flushing  muscle weakness  signs and symptoms of low blood pressure like dizziness; feeling faint or lightheaded, falls; unusually weak or tired  signs and symptoms of a dangerous change in heartbeat or heart rhythm like chest pain; dizziness; fast or irregular heartbeat; palpitations; breathing problems  sweating This list may not describe all possible side effects. Call your doctor for medical advice about side effects. You may report side effects to FDA at 1-800-FDA-1088. Where should I keep my medicine? This drug is given in a hospital or clinic and will not be stored at home. NOTE: This sheet is a summary. It may not cover all possible information. If you have questions about this medicine, talk to your doctor, pharmacist, or health care provider.  2020 Elsevier/Gold Standard (2015-08-05 12:31:42)   Rehydration, Adult Rehydration is the replacement of body fluids and salts and minerals (electrolytes) that are lost during dehydration. Dehydration is when there is not enough fluid or water in the body. This happens when you lose more fluids than you take in. Common causes of dehydration include:  Vomiting.  Diarrhea.  Excessive sweating, such as from heat exposure or exercise.  Taking medicines that cause the body to lose excess fluid (diuretics).  Impaired kidney function.  Not drinking enough fluid.  Certain illnesses or infections.  Certain poorly controlled long-term (chronic) illnesses, such as diabetes, heart disease, and kidney disease.  Symptoms of mild dehydration may include  thirst, dry lips and mouth, dry skin, and dizziness. Symptoms of severe dehydration may include increased heart rate, confusion, fainting, and not urinating. You can rehydrate by drinking certain fluids or getting fluids through an IV tube, as told by your health care provider. What are the risks? Generally, rehydration is safe. However, one problem that can happen is taking in too much fluid (overhydration). This is rare. If overhydration happens, it can cause an electrolyte imbalance, kidney failure, or a decrease in salt (sodium) levels in the body. How to rehydrate Follow instructions from your health care provider for rehydration. The kind of fluid you should drink and the amount you should drink depend on your condition.  If directed by your health care provider, drink an oral rehydration solution (ORS). This is a drink designed to treat dehydration that is found in pharmacies and retail stores. ? Make an ORS by following instructions on the package. ? Start by drinking small amounts, about  cup (120 mL) every 5-10 minutes. ? Slowly increase how much you drink until you have taken the amount recommended by your health care provider.  Drink enough clear fluids to keep your urine clear or pale yellow. If you were instructed to drink an ORS, finish the ORS first, then start slowly drinking other clear fluids. Drink fluids such as: ? Water. Do not drink only water. Doing that can lead to having too little sodium in your body (hyponatremia). ? Ice chips. ? Fruit juice that you have added water to (diluted juice). ? Low-calorie sports drinks.  If you are severely dehydrated, your health care provider may recommend that you receive fluids through an IV tube in the hospital.  Do not take sodium tablets. Doing that can lead to the condition of having too much sodium in your body (hypernatremia). Eating while you rehydrate Follow instructions from your health care provider about what to eat while  you rehydrate. Your health care provider may recommend that you slowly begin eating regular foods in small amounts.  Eat foods that contain a healthy balance of electrolytes, such as bananas, oranges, potatoes, tomatoes, and spinach.  Avoid foods that are greasy or contain a lot of fat or sugar.  In some cases, you may get nutrition through a feeding tube that is passed through your nose and into your stomach (nasogastric tube, or NG tube). This may be done if you have uncontrolled vomiting or diarrhea. Beverages to avoid Certain beverages may make dehydration worse. While you rehydrate, avoid:  Alcohol.  Caffeine.  Drinks that contain a lot of sugar. These include: ? High-calorie sports drinks. ? Fruit juice that is not diluted. ? Soda.  Check nutrition labels to see how much sugar or caffeine a beverage contains. Signs of dehydration recovery You may be recovering from dehydration if:  You are urinating more often than before you started rehydrating.  Your urine is clear or pale yellow.  Your energy level improves.  You vomit less frequently.  You have diarrhea less frequently.  Your appetite improves or returns to normal.  You feel less dizzy or less light-headed.  Your skin tone and color start to look more normal. Contact a health care provider if:  You continue to have symptoms of mild dehydration, such as: ? Thirst. ? Dry lips. ? Slightly dry mouth. ?  Dry, warm skin. ? Dizziness.  You continue to vomit or have diarrhea. Get help right away if:  You have symptoms of dehydration that get worse.  You feel: ? Confused. ? Weak. ? Like you are going to faint.  You have not urinated in 6-8 hours.  You have very dark urine.  You have trouble breathing.  Your heart rate while sitting still is over 100 beats a minute.  You cannot drink fluids without vomiting.  You have vomiting or diarrhea that: ? Gets worse. ? Does not go away.  You have a  fever. This information is not intended to replace advice given to you by your health care provider. Make sure you discuss any questions you have with your health care provider. Document Revised: 12/30/2016 Document Reviewed: 03/13/2015 Elsevier Patient Education  2020 [employer].

## 2019-11-24 ENCOUNTER — Other Ambulatory Visit: Payer: Self-pay | Admitting: Oncology

## 2019-11-25 ENCOUNTER — Other Ambulatory Visit: Payer: Self-pay

## 2019-11-25 ENCOUNTER — Inpatient Hospital Stay: Payer: Medicare Other

## 2019-11-25 ENCOUNTER — Inpatient Hospital Stay (HOSPITAL_BASED_OUTPATIENT_CLINIC_OR_DEPARTMENT_OTHER): Payer: Medicare Other | Admitting: Nurse Practitioner

## 2019-11-25 ENCOUNTER — Encounter: Payer: Self-pay | Admitting: Nurse Practitioner

## 2019-11-25 VITALS — HR 98

## 2019-11-25 VITALS — BP 80/62 | HR 103 | Temp 98.5°F | Resp 18 | Ht 74.0 in

## 2019-11-25 DIAGNOSIS — C187 Malignant neoplasm of sigmoid colon: Secondary | ICD-10-CM

## 2019-11-25 DIAGNOSIS — C186 Malignant neoplasm of descending colon: Secondary | ICD-10-CM | POA: Diagnosis not present

## 2019-11-25 DIAGNOSIS — Z95828 Presence of other vascular implants and grafts: Secondary | ICD-10-CM

## 2019-11-25 DIAGNOSIS — Z5111 Encounter for antineoplastic chemotherapy: Secondary | ICD-10-CM | POA: Diagnosis not present

## 2019-11-25 LAB — CBC WITH DIFFERENTIAL (CANCER CENTER ONLY)
Abs Immature Granulocytes: 0.03 10*3/uL (ref 0.00–0.07)
Basophils Absolute: 0.1 10*3/uL (ref 0.0–0.1)
Basophils Relative: 1 %
Eosinophils Absolute: 0.1 10*3/uL (ref 0.0–0.5)
Eosinophils Relative: 1 %
HCT: 27.4 % — ABNORMAL LOW (ref 39.0–52.0)
Hemoglobin: 8.5 g/dL — ABNORMAL LOW (ref 13.0–17.0)
Immature Granulocytes: 0 %
Lymphocytes Relative: 12 %
Lymphs Abs: 1.2 10*3/uL (ref 0.7–4.0)
MCH: 27.2 pg (ref 26.0–34.0)
MCHC: 31 g/dL (ref 30.0–36.0)
MCV: 87.5 fL (ref 80.0–100.0)
Monocytes Absolute: 1.1 10*3/uL — ABNORMAL HIGH (ref 0.1–1.0)
Monocytes Relative: 11 %
Neutro Abs: 7.4 10*3/uL (ref 1.7–7.7)
Neutrophils Relative %: 75 %
Platelet Count: 291 10*3/uL (ref 150–400)
RBC: 3.13 MIL/uL — ABNORMAL LOW (ref 4.22–5.81)
RDW: 14.9 % (ref 11.5–15.5)
WBC Count: 9.9 10*3/uL (ref 4.0–10.5)
nRBC: 0 % (ref 0.0–0.2)

## 2019-11-25 LAB — CMP (CANCER CENTER ONLY)
ALT: 18 U/L (ref 0–44)
AST: 21 U/L (ref 15–41)
Albumin: 2.7 g/dL — ABNORMAL LOW (ref 3.5–5.0)
Alkaline Phosphatase: 164 U/L — ABNORMAL HIGH (ref 38–126)
Anion gap: 7 (ref 5–15)
BUN: 24 mg/dL — ABNORMAL HIGH (ref 8–23)
CO2: 30 mmol/L (ref 22–32)
Calcium: 9.5 mg/dL (ref 8.9–10.3)
Chloride: 98 mmol/L (ref 98–111)
Creatinine: 1.1 mg/dL (ref 0.61–1.24)
GFR, Estimated: >60 mL/min (ref >60)
Glucose, Bld: 151 mg/dL — ABNORMAL HIGH (ref 70–99)
Potassium: 4.1 mmol/L (ref 3.5–5.1)
Sodium: 135 mmol/L (ref 135–145)
Total Bilirubin: 0.7 mg/dL (ref 0.3–1.2)
Total Protein: 6.5 g/dL (ref 6.5–8.1)

## 2019-11-25 LAB — MAGNESIUM: Magnesium: 1.5 mg/dL — ABNORMAL LOW (ref 1.7–2.4)

## 2019-11-25 LAB — CEA (IN HOUSE-CHCC): CEA (CHCC-In House): 174.72 ng/mL — ABNORMAL HIGH (ref 0.00–5.00)

## 2019-11-25 MED ORDER — SODIUM CHLORIDE 0.9 % IV SOLN
1600.0000 mg/m2 | INTRAVENOUS | Status: DC
  Administered 2019-11-25: 3300 mg via INTRAVENOUS
  Filled 2019-11-25: qty 66

## 2019-11-25 MED ORDER — SODIUM CHLORIDE 0.9% FLUSH
10.0000 mL | Freq: Once | INTRAVENOUS | Status: AC
  Administered 2019-11-25: 10 mL
  Filled 2019-11-25: qty 10

## 2019-11-25 NOTE — Progress Notes (Signed)
[facility] OFFICE PROGRESS NOTE   Diagnosis: Colon cancer  INTERVAL HISTORY:   Clarence Andrews returns as scheduled.  He completed another cycle of 5-fluorouracil 11/11/2019.  He denies nausea/vomiting.  No mouth sores.  He did have diarrhea.  He took Imodium with good results.  He reports stable chronic right knee pain.  Objective:  Vital signs in last 24 hours:  Blood pressure (!) 80/62, pulse (!) 103, temperature 98.5 F (36.9 C), temperature source Tympanic, resp. rate 18, height _0  (1.88 m), SpO2 100 %.    HEENT: No thrush or ulcers. Resp: Lungs clear bilaterally. Cardio: Regular rate and rhythm. GI: No hepatomegaly. Vascular: No leg edema.  Skin: Palms without erythema.  Skin in general has a dry appearance. Port-A-Cath without erythema.   Lab Results:  Lab Results  Component Value Date   WBC 9.9 11/25/2019   HGB 8.5 (L) 11/25/2019   HCT 27.4 (L) 11/25/2019   MCV 87.5 11/25/2019   PLT 291 11/25/2019   NEUTROABS 7.4 11/25/2019    Imaging:  No results found.  Medications: I have reviewed the patient's current medications.  Assessment/Plan: 1. 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 Colonoscopy 03/07/2019-nearly completely obstructing mass beginning at 16 cm from the anal verge, sigmoid colon polyp ? CTs 03/15/2019-wall thickening of the lower sigmoid colon, low rectal mass with significant luminal narrowing, diffuse liver metastases, borderline enlarged sigmoid mesocolon, iliac, and upper abdominal lymph nodes ? Elevated CEA ? 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 Cycle 1 FOLFOX 04/11/2019 ? Cycle 2 FOLFOX 05/28/2019 ? Cycle 3 FOLFOX 06/11/2019 ? Cycle 4 FOLFOX 06/25/2019 ? CT abdomen/pelvis 07/10/2019-severe obstructing stricture at the sigmoid colon, stable to  slight improvement in hepatic metastatic disease compared to February 2021 ? Cycle 5 FOLFOX 07/31/2019 ? Cycle6FOLFOX 08/13/2019 ? Cycle 7 FOLFOX 08/27/2019 ? CT abdomen/pelvis 09/12/2019-stable and slightly decreased hepatic lesions, no evidence of progressive metastatic disease, slight enlargement of a previously noted pancreas head/uncinate lesion, no evidence of colonic obstruction, stable small mesenteric and retroperitoneal nodes ? Cycle8FOLFOX 09/26/2019, oxaliplatin held secondary to persistent orthostatic hypotension ? Cycle 9 FOLFOX 10/14/2019, oxaliplatin on hold secondary to persistent orthostatic hypotension ? Cycle 10 FOLFOX 10/28/2019, oxaliplatin on hold secondary to persistent orthostatic hypotension ? Cycle 11 FOLFOX 11/11/2019, oxaliplatin on hold secondary to persistent orthostatic hypotension ? Cycle 12 FOLFOX 11/25/2019, oxaliplatin on hold secondary to persistent orthostatic hypotension 2. 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 Diabetes 4. Coronary artery disease, status post coronary artery bypass surgery in 2017 5. Neutropenia following cycle 1 FOLFOX 6. Admission 04/28/2019 with COVID-19 infection 7. C. difficile colitis 04/28/2019 8. Atrial flutter during hospital admission March/April 2021-treated with a Cardizem drip, digoxin, and Eliquis anticoagulation. Converted to metoprolol at discharge.  Admission with recurrent rapid atrial fibrillation/flutter 07/09/2019 9. 07/09/2019-hospital admission for bowel obstruction  Sigmoidoscopy 07/12/2019-completely obstructing mass at 17-20 cm proximal to the anus-oozing present, stent placed 10. Fall 07/24/2019 with acute kidney injury/rhabdomyolysis 11. 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  Florinef 0.1 mg daily beginning  11/11/2019. 13.Right foot drop is likely due to weight loss/nerve compression 14. C. 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  He has completed 11 cycles of FOLFOX with oxaliplatin on hold secondary to  persistent orthostatic hypotension.  Plan to proceed with cycle 12 FOLFOX today, continue to hold oxaliplatin.  Restaging CTs prior to his next visit.  We reviewed the CBC from today.  Counts adequate to proceed with treatment.  He has persistent hypotension.  He continues midodrine, Florinef added 2 weeks ago.  He will return for lab, follow-up, treatment in 2 weeks.  He will contact the office in the interim with any problems.  Plan reviewed with Dr. [name].    Ned Card ANP/GNP-BC   11/25/2019  1:10 PM

## 2019-11-25 NOTE — Patient Instructions (Signed)

## 2019-11-25 NOTE — Patient Instructions (Signed)
[facility] Discharge Instructions for Patients Receiving Chemotherapy  Today you received the following chemotherapy agents: Fluorouracil.  To help prevent nausea and vomiting after your treatment, we encourage you to take your nausea medication as directed.   If you develop nausea and vomiting that is not controlled by your nausea medication, call the clinic.   BELOW ARE SYMPTOMS THAT SHOULD BE REPORTED IMMEDIATELY:  *FEVER GREATER THAN 100.5 F  *CHILLS WITH OR WITHOUT FEVER  NAUSEA AND VOMITING THAT IS NOT CONTROLLED WITH YOUR NAUSEA MEDICATION  *UNUSUAL SHORTNESS OF BREATH  *UNUSUAL BRUISING OR BLEEDING  TENDERNESS IN MOUTH AND THROAT WITH OR WITHOUT PRESENCE OF ULCERS  *URINARY PROBLEMS  *BOWEL PROBLEMS  UNUSUAL RASH Items with * indicate a potential emergency and should be followed up as soon as possible.  Feel free to call the clinic should you have any questions or concerns. The clinic phone number is [phone].  Please show the [facility] at check-in to the Emergency Department and triage nurse.

## 2019-11-26 ENCOUNTER — Telehealth: Payer: Self-pay | Admitting: Nurse Practitioner

## 2019-11-26 NOTE — Telephone Encounter (Signed)
Scheduled appointments per 10/25 los. Will have updated calendar printed for patient at next visit.

## 2019-11-27 ENCOUNTER — Inpatient Hospital Stay: Payer: Medicare Other

## 2019-11-27 ENCOUNTER — Other Ambulatory Visit: Payer: Self-pay

## 2019-11-27 DIAGNOSIS — C187 Malignant neoplasm of sigmoid colon: Secondary | ICD-10-CM

## 2019-11-27 DIAGNOSIS — Z5111 Encounter for antineoplastic chemotherapy: Secondary | ICD-10-CM | POA: Diagnosis not present

## 2019-11-27 DIAGNOSIS — C186 Malignant neoplasm of descending colon: Secondary | ICD-10-CM

## 2019-11-27 MED ORDER — HEPARIN SOD (PORK) LOCK FLUSH 100 UNIT/ML IV SOLN
500.0000 [IU] | Freq: Once | INTRAVENOUS | Status: AC | PRN
  Administered 2019-11-27: 500 [IU]
  Filled 2019-11-27: qty 5

## 2019-11-27 MED ORDER — SODIUM CHLORIDE 0.9% FLUSH
10.0000 mL | INTRAVENOUS | Status: DC | PRN
  Administered 2019-11-27: 10 mL
  Filled 2019-11-27: qty 10

## 2019-11-29 ENCOUNTER — Telehealth: Payer: Self-pay | Admitting: *Deleted

## 2019-11-29 NOTE — Telephone Encounter (Addendum)
Patient did not receive IV magnesium needed on 10/27--attempting to get appointment for him to come to [facility] on 10/28 or 10/29 for infusion, however transportation at facility could not bring him on 10/28 and are not working today. Called and spoke with nurse, Jerene Pitch and confirmed he is on magnesium oxide 800 mg daily.  Inquired if the facility is able to give IV fluids with magnesium there? Per nursing manager at facility, they are not able to administer fluids w/additives. Patient has been scheduled for infusion on 12/02/19--SNF is to arrange transportation. [facility] pharmacy is dosing and putting in orders.

## 2019-12-02 ENCOUNTER — Ambulatory Visit: Payer: Medicare Other

## 2019-12-03 ENCOUNTER — Inpatient Hospital Stay: Payer: Medicare Other | Attending: Oncology

## 2019-12-03 ENCOUNTER — Other Ambulatory Visit: Payer: Self-pay

## 2019-12-03 ENCOUNTER — Inpatient Hospital Stay: Payer: Medicare Other

## 2019-12-03 VITALS — BP 129/75 | HR 77 | Temp 98.1°F | Resp 18

## 2019-12-03 DIAGNOSIS — Z79899 Other long term (current) drug therapy: Secondary | ICD-10-CM | POA: Diagnosis not present

## 2019-12-03 DIAGNOSIS — C787 Secondary malignant neoplasm of liver and intrahepatic bile duct: Secondary | ICD-10-CM | POA: Insufficient documentation

## 2019-12-03 DIAGNOSIS — C19 Malignant neoplasm of rectosigmoid junction: Secondary | ICD-10-CM | POA: Insufficient documentation

## 2019-12-03 DIAGNOSIS — R970 Elevated carcinoembryonic antigen [CEA]: Secondary | ICD-10-CM | POA: Diagnosis not present

## 2019-12-03 DIAGNOSIS — Z95828 Presence of other vascular implants and grafts: Secondary | ICD-10-CM

## 2019-12-03 DIAGNOSIS — I951 Orthostatic hypotension: Secondary | ICD-10-CM | POA: Insufficient documentation

## 2019-12-03 DIAGNOSIS — D638 Anemia in other chronic diseases classified elsewhere: Secondary | ICD-10-CM | POA: Diagnosis not present

## 2019-12-03 DIAGNOSIS — Z5111 Encounter for antineoplastic chemotherapy: Secondary | ICD-10-CM | POA: Diagnosis present

## 2019-12-03 DIAGNOSIS — Z5112 Encounter for antineoplastic immunotherapy: Secondary | ICD-10-CM | POA: Diagnosis not present

## 2019-12-03 MED ORDER — HEPARIN SOD (PORK) LOCK FLUSH 100 UNIT/ML IV SOLN
500.0000 [IU] | Freq: Once | INTRAVENOUS | Status: AC
  Administered 2019-12-03: 500 [IU]
  Filled 2019-12-03: qty 5

## 2019-12-03 MED ORDER — SODIUM CHLORIDE 0.9 % IV SOLN
4.0000 g | Freq: Once | INTRAVENOUS | Status: AC
  Administered 2019-12-03: 4 g via INTRAVENOUS
  Filled 2019-12-03: qty 8

## 2019-12-03 MED ORDER — SODIUM CHLORIDE 0.9% FLUSH
10.0000 mL | Freq: Once | INTRAVENOUS | Status: AC
  Administered 2019-12-03: 10 mL
  Filled 2019-12-03: qty 10

## 2019-12-03 MED ORDER — SODIUM CHLORIDE 0.9 % IV SOLN
INTRAVENOUS | Status: DC
  Filled 2019-12-03: qty 250

## 2019-12-03 NOTE — Patient Instructions (Signed)
Hypomagnesemia Hypomagnesemia is a condition in which the level of magnesium in the blood is low. Magnesium is a mineral that is found in many foods. It is used in many different processes in the body. Hypomagnesemia can affect every organ in the body. In severe cases, it can cause life-threatening problems. What are the causes? This condition may be caused by:  Not getting enough magnesium in your diet.  Malnutrition.  Problems with absorbing magnesium from the intestines.  Dehydration.  Alcohol abuse.  Vomiting.  Severe or chronic diarrhea.  Some medicines, including medicines that make you urinate more (diuretics).  Certain diseases, such as kidney disease, diabetes, celiac disease, and overactive thyroid. What are the signs or symptoms? Symptoms of this condition include:  Loss of appetite.  Nausea and vomiting.  Involuntary shaking or trembling of a body part (tremor).  Muscle weakness.  Tingling in the arms and legs.  Sudden tightening of muscles (muscle spasms).  Confusion.  Psychiatric issues, such as depression, irritability, or psychosis.  A feeling of fluttering of the heart.  Seizures. These symptoms are more severe if magnesium levels drop suddenly. How is this diagnosed? This condition may be diagnosed based on:  Your symptoms and medical history.  A physical exam.  Blood and urine tests. How is this treated? Treatment depends on the cause and the severity of the condition. It may be treated with:  A magnesium supplement. This can be taken in pill form. If the condition is severe, magnesium is usually given through an IV.  Changes to your diet. You may be directed to eat foods that have a lot of magnesium, such as green leafy vegetables, peas, beans, and nuts.  Stopping any intake of alcohol. Follow these instructions at home:      Make sure that your diet includes foods with magnesium. Foods that have a lot of magnesium in them  include: ? Green leafy vegetables, such as spinach and broccoli. ? Beans and peas. ? Nuts and seeds, such as almonds and sunflower seeds. ? Whole grains, such as whole grain bread and fortified cereals.  Take magnesium supplements if your health care provider tells you to do that. Take them as directed.  Take over-the-counter and prescription medicines only as told by your health care provider.  Have your magnesium levels monitored as told by your health care provider.  When you are active, drink fluids that contain electrolytes.  Avoid drinking alcohol.  Keep all follow-up visits as told by your health care provider. This is important. Contact a health care provider if:  You get worse instead of better.  Your symptoms return. Get help right away if you:  Develop severe muscle weakness.  Have trouble breathing.  Feel that your heart is racing. Summary  Hypomagnesemia is a condition in which the level of magnesium in the blood is low.  Hypomagnesemia can affect every organ in the body.  Treatment may include eating more foods that contain magnesium, taking magnesium supplements, and not drinking alcohol.  Have your magnesium levels monitored as told by your health care provider. This information is not intended to replace advice given to you by your health care provider. Make sure you discuss any questions you have with your health care provider. Document Revised: 12/30/2016 Document Reviewed: 12/19/2016 Elsevier Patient Education  2020 Elsevier Inc.  

## 2019-12-05 ENCOUNTER — Ambulatory Visit (HOSPITAL_COMMUNITY)
Admission: RE | Admit: 2019-12-05 | Discharge: 2019-12-05 | Disposition: A | Discharge status: Home / Self Care | Payer: Medicare Other | Source: Ambulatory Visit | Attending: Nurse Practitioner | Admitting: Nurse Practitioner

## 2019-12-05 ENCOUNTER — Other Ambulatory Visit: Payer: Self-pay | Admitting: *Deleted

## 2019-12-05 ENCOUNTER — Other Ambulatory Visit: Payer: Self-pay

## 2019-12-05 DIAGNOSIS — C186 Malignant neoplasm of descending colon: Secondary | ICD-10-CM | POA: Diagnosis present

## 2019-12-05 MED ORDER — IOHEXOL 300 MG/ML  SOLN
100.0000 mL | Freq: Once | INTRAMUSCULAR | Status: AC | PRN
  Administered 2019-12-05: 100 mL via INTRAVENOUS

## 2019-12-05 MED ORDER — HEPARIN SOD (PORK) LOCK FLUSH 100 UNIT/ML IV SOLN
INTRAVENOUS | Status: AC
  Administered 2019-12-05: 500 [IU]
  Filled 2019-12-05: qty 5

## 2019-12-05 MED ORDER — HEPARIN SOD (PORK) LOCK FLUSH 100 UNIT/ML IV SOLN: 500.0000 [IU] | Freq: Once | INTRAVENOUS | Status: AC

## 2019-12-09 ENCOUNTER — Inpatient Hospital Stay: Payer: Medicare Other

## 2019-12-09 ENCOUNTER — Other Ambulatory Visit: Payer: Self-pay

## 2019-12-09 ENCOUNTER — Inpatient Hospital Stay (HOSPITAL_BASED_OUTPATIENT_CLINIC_OR_DEPARTMENT_OTHER): Payer: Medicare Other | Admitting: Oncology

## 2019-12-09 VITALS — BP 117/60 | HR 90

## 2019-12-09 VITALS — BP 56/43 | HR 101 | Temp 98.7°F | Resp 24 | Wt 187.2 lb

## 2019-12-09 DIAGNOSIS — Z5112 Encounter for antineoplastic immunotherapy: Secondary | ICD-10-CM | POA: Diagnosis not present

## 2019-12-09 DIAGNOSIS — C189 Malignant neoplasm of colon, unspecified: Secondary | ICD-10-CM

## 2019-12-09 DIAGNOSIS — C186 Malignant neoplasm of descending colon: Secondary | ICD-10-CM

## 2019-12-09 DIAGNOSIS — Z95828 Presence of other vascular implants and grafts: Secondary | ICD-10-CM

## 2019-12-09 DIAGNOSIS — C187 Malignant neoplasm of sigmoid colon: Secondary | ICD-10-CM

## 2019-12-09 LAB — CMP (CANCER CENTER ONLY)
ALT: 16 U/L (ref 0–44)
AST: 23 U/L (ref 15–41)
Albumin: 2.7 g/dL — ABNORMAL LOW (ref 3.5–5.0)
Alkaline Phosphatase: 154 U/L — ABNORMAL HIGH (ref 38–126)
Anion gap: 10 (ref 5–15)
BUN: 21 mg/dL (ref 8–23)
CO2: 27 mmol/L (ref 22–32)
Calcium: 8.9 mg/dL (ref 8.9–10.3)
Chloride: 102 mmol/L (ref 98–111)
Creatinine: 1.14 mg/dL (ref 0.61–1.24)
GFR, Estimated: >60 mL/min (ref >60)
Glucose, Bld: 279 mg/dL — ABNORMAL HIGH (ref 70–99)
Potassium: 4.1 mmol/L (ref 3.5–5.1)
Sodium: 139 mmol/L (ref 135–145)
Total Bilirubin: 0.3 mg/dL (ref 0.3–1.2)
Total Protein: 6.6 g/dL (ref 6.5–8.1)

## 2019-12-09 LAB — CBC WITH DIFFERENTIAL (CANCER CENTER ONLY)
Abs Immature Granulocytes: 0.02 10*3/uL (ref 0.00–0.07)
Basophils Absolute: 0 10*3/uL (ref 0.0–0.1)
Basophils Relative: 1 %
Eosinophils Absolute: 0.2 10*3/uL (ref 0.0–0.5)
Eosinophils Relative: 3 %
HCT: 29.1 % — ABNORMAL LOW (ref 39.0–52.0)
Hemoglobin: 8.8 g/dL — ABNORMAL LOW (ref 13.0–17.0)
Immature Granulocytes: 0 %
Lymphocytes Relative: 16 %
Lymphs Abs: 1 10*3/uL (ref 0.7–4.0)
MCH: 26 pg (ref 26.0–34.0)
MCHC: 30.2 g/dL (ref 30.0–36.0)
MCV: 86.1 fL (ref 80.0–100.0)
Monocytes Absolute: 0.9 10*3/uL (ref 0.1–1.0)
Monocytes Relative: 13 %
Neutro Abs: 4.4 10*3/uL (ref 1.7–7.7)
Neutrophils Relative %: 67 %
Platelet Count: 320 10*3/uL (ref 150–400)
RBC: 3.38 MIL/uL — ABNORMAL LOW (ref 4.22–5.81)
RDW: 15.3 % (ref 11.5–15.5)
WBC Count: 6.6 10*3/uL (ref 4.0–10.5)
nRBC: 0 % (ref 0.0–0.2)

## 2019-12-09 LAB — MAGNESIUM: Magnesium: 1.6 mg/dL — ABNORMAL LOW (ref 1.7–2.4)

## 2019-12-09 MED ORDER — SODIUM CHLORIDE 0.9 % IV SOLN
5.0000 mg/kg | Freq: Once | INTRAVENOUS | Status: AC
  Administered 2019-12-09: 400 mg via INTRAVENOUS
  Filled 2019-12-09: qty 16

## 2019-12-09 MED ORDER — SODIUM CHLORIDE 0.9 % IV SOLN
INTRAVENOUS | Status: AC
  Filled 2019-12-09: qty 250

## 2019-12-09 MED ORDER — HEPARIN SOD (PORK) LOCK FLUSH 100 UNIT/ML IV SOLN
500.0000 [IU] | Freq: Once | INTRAVENOUS | Status: DC
  Filled 2019-12-09: qty 5

## 2019-12-09 MED ORDER — SODIUM CHLORIDE 0.9 % IV SOLN
10.0000 mg | Freq: Once | INTRAVENOUS | Status: AC
  Administered 2019-12-09: 10 mg via INTRAVENOUS
  Filled 2019-12-09: qty 10

## 2019-12-09 MED ORDER — ATROPINE SULFATE 1 MG/ML IJ SOLN
INTRAMUSCULAR | Status: AC
  Filled 2019-12-09: qty 1

## 2019-12-09 MED ORDER — HEPARIN SOD (PORK) LOCK FLUSH 100 UNIT/ML IV SOLN
500.0000 [IU] | Freq: Once | INTRAVENOUS | Status: DC | PRN
  Filled 2019-12-09: qty 5

## 2019-12-09 MED ORDER — SODIUM CHLORIDE 0.9% FLUSH
10.0000 mL | Freq: Once | INTRAVENOUS | Status: AC
  Administered 2019-12-09: 10 mL
  Filled 2019-12-09: qty 10

## 2019-12-09 MED ORDER — PALONOSETRON HCL INJECTION 0.25 MG/5ML
INTRAVENOUS | Status: AC
  Filled 2019-12-09: qty 5

## 2019-12-09 MED ORDER — PALONOSETRON HCL INJECTION 0.25 MG/5ML
0.2500 mg | Freq: Once | INTRAVENOUS | Status: AC
  Administered 2019-12-09: 0.25 mg via INTRAVENOUS

## 2019-12-09 MED ORDER — ATROPINE SULFATE 1 MG/ML IJ SOLN
0.5000 mg | Freq: Once | INTRAMUSCULAR | Status: AC | PRN
  Administered 2019-12-09: 0.5 mg via INTRAVENOUS

## 2019-12-09 MED ORDER — SODIUM CHLORIDE 0.9% FLUSH
10.0000 mL | INTRAVENOUS | Status: DC | PRN
  Filled 2019-12-09: qty 10

## 2019-12-09 MED ORDER — SODIUM CHLORIDE 0.9 % IV SOLN: 5.0000 mg/kg | Freq: Once | INTRAVENOUS | Status: DC

## 2019-12-09 MED ORDER — SODIUM CHLORIDE 0.9 % IV SOLN
Freq: Once | INTRAVENOUS | Status: AC
  Filled 2019-12-09: qty 250

## 2019-12-09 MED ORDER — SODIUM CHLORIDE 0.9 % IV SOLN
1600.0000 mg/m2 | INTRAVENOUS | Status: DC
  Administered 2019-12-09: 3400 mg via INTRAVENOUS
  Filled 2019-12-09: qty 68

## 2019-12-09 MED ORDER — SODIUM CHLORIDE 0.9 % IV SOLN
125.0000 mg/m2 | Freq: Once | INTRAVENOUS | Status: AC
  Administered 2019-12-09: 260 mg via INTRAVENOUS
  Filled 2019-12-09: qty 13

## 2019-12-09 MED ORDER — SODIUM CHLORIDE 0.9 % IV SOLN
200.0000 mg/m2 | Freq: Once | INTRAVENOUS | Status: AC
  Administered 2019-12-09: 422 mg via INTRAVENOUS
  Filled 2019-12-09: qty 21.1

## 2019-12-09 NOTE — Progress Notes (Signed)
[facility] OFFICE PROGRESS NOTE   Diagnosis: Colon cancer  INTERVAL HISTORY:   Clarence Andrews returns as scheduled.  He completed another cycle of 5-fluorouracil on 11/25/2019.  No new complaint.  No peripheral numbness.  No diarrhea.  Good appetite.  He continues to have orthostasis.  Objective:  Vital signs in last 24 hours:  Blood pressure (!) 56/43, pulse (!) 101, temperature 98.7 F (37.1 C), temperature source Tympanic, resp. rate (!) 24, weight 187 lb 3.2 oz (84.9 kg), SpO2 100 %.  Supine blood pressure 122/70    HEENT: No thrush or ulcers, mucous membranes are moist Lymphatics: No cervical, supraclavicular, or axillary nodes Resp: Lungs clear bilaterally Cardio: Regular rate and rhythm, distant heart sounds GI: No hepatomegaly, nontender Vascular: No leg edema Neuro: Alert and oriented    Portacath/PICC-without erythema  Lab Results:  Lab Results  Component Value Date   WBC 6.6 12/09/2019   HGB 8.8 (L) 12/09/2019   HCT 29.1 (L) 12/09/2019   MCV 86.1 12/09/2019   PLT 320 12/09/2019   NEUTROABS 4.4 12/09/2019    CMP  Lab Results  Component Value Date   NA 139 12/09/2019   K 4.1 12/09/2019   CL 102 12/09/2019   CO2 27 12/09/2019   GLUCOSE 279 (H) 12/09/2019   BUN 21 12/09/2019   CREATININE 1.14 12/09/2019   CALCIUM 8.9 12/09/2019   PROT 6.6 12/09/2019   ALBUMIN 2.7 (L) 12/09/2019   AST 23 12/09/2019   ALT 16 12/09/2019   ALKPHOS 154 (H) 12/09/2019   BILITOT 0.3 12/09/2019   GFRNONAA >60 12/09/2019   GFRAA >60 10/28/2019    Lab Results  Component Value Date   CEA1 174.72 (H) 11/25/2019    Lab Results  Component Value Date   INR 1.2 03/29/2019    Imaging:  CT Abdomen Pelvis W Contrast  Result Date: 12/06/2019 CLINICAL DATA:  69 year old male with history of colon cancer. Restaging examination. Ongoing chemotherapy. EXAM: CT ABDOMEN AND PELVIS WITH CONTRAST TECHNIQUE: Multidetector CT imaging of the abdomen and pelvis was  performed using the standard protocol following bolus administration of intravenous contrast. CONTRAST:  167m OMNIPAQUE IOHEXOL 300 MG/ML  SOLN COMPARISON:  CT the abdomen and pelvis 09/11/2019. FINDINGS: Lower chest: Left atrial appendage ligation clip. Atherosclerotic calcifications in left anterior descending, left circumflex and right coronary arteries. Postoperative changes of median sternotomy for CABG, including LIMA to the LAD. Calcified granulomas in the anterior aspect of the left upper lobe. Hepatobiliary: Multiple hypovascular hepatic lesions appear increased in number and size compared to the prior examination. The largest of these lesions is in the central aspect of the liver predominantly in segment 4A (axial image 21 of series 2), currently measuring 6.6 x 5.5 cm (previously 5.7 x 3.9 cm). Another prominent lesion in the inferior aspect of the right lobe of the liver between segments 5 and 6 (axial image 33 of series 2), currently 5.4 x 4.0 cm (previously 3.5 x 3.3 cm. Several small new lesions are also noted in segment 2 (axial image 22 of series 2), central aspect of the left lobe of the liver between segments 2 and 3 (axial image 23 of series 2), segment 8 (axial image 16 of series 2), and segment 7 (axial image 24 of series 2). No intra or extrahepatic biliary ductal dilatation. Tiny calcified gallstones in the neck of the gallbladder. No findings to suggest an acute cholecystitis at this time. Pancreas: 1.4 x 1.0 cm low-attenuation lesion in the head of  the pancreas (image 35 of series 2), stable compared to the prior study, too small to characterize. No other pancreatic mass. No pancreatic ductal dilatation. No peripancreatic fluid collections or inflammatory changes. Spleen: Unremarkable. Adrenals/Urinary Tract: Multiple low-attenuation lesions in both kidneys, compatible with simple cysts, largest of which is exophytic in the posterior aspect of the upper pole the right kidney measuring 5.5  x 4.1 cm. Other subcentimeter low-attenuation lesions in both kidneys are too small to definitively characterize, but statistically likely to represent tiny cysts. Bilateral adrenal glands are normal in appearance. No hydroureteronephrosis. Asymmetric mural thickening of the urinary bladder wall, most severe posteriorly where there also appears to be some increased urothelial enhancement, measuring up to 1.4 cm in thickness (sagittal image 70 of series 6). Stomach/Bowel: The appearance of the stomach is normal. No pathologic dilatation of small bowel or colon. Stent traversing an area of stenosis in the distal sigmoid colon. The appendix is not confidently identified and may be surgically absent. Regardless, there are no inflammatory changes noted adjacent to the cecum to suggest the presence of an acute appendicitis at this time. Vascular/Lymphatic: Mild atherosclerotic disease in the abdominal aorta. No aneurysm or dissection noted in the abdominal or pelvic vasculature. Borderline enlarged peripancreatic lymph node (axial image 34 of series 2), stable compared to prior examination. No other definite pathologically enlarged lymph nodes are noted in the abdomen or pelvis. Reproductive: Prostate gland and seminal vesicles are unremarkable in appearance. Other: No significant volume of ascites.  No pneumoperitoneum. Musculoskeletal: There are no aggressive appearing lytic or blastic lesions noted in the visualized portions of the skeleton. IMPRESSION: 1. Progressive metastatic disease to the liver with increased number and size of numerous hypovascular lesions, as detailed above. 2. No other sites of extra hepatic metastatic disease confidently identified in the abdomen or pelvis. 3. Profound mural thickening in the posterior aspect of the urinary bladder with some urothelial hyperenhancement, increased compared to the prior examination. This may reflect post treatment changes from prior radiation therapy, however,  with urologic consultation should be considered for further clinical evaluation. 4. Small cystic lesion in the head of the pancreas, nonspecific, but stable compared to the prior study and favored to represent a small pseudocyst or other benign lesion. Attention at time of routine follow-up imaging is recommended. 5. Stable position of stent in the sigmoid colon, without evidence of bowel obstruction. 6. Aortic atherosclerosis. 7. Cholelithiasis without evidence of acute cholecystitis at this time. 8. Additional incidental findings, as above. Electronically Signed   By: Vinnie Langton M.D.   On: 12/06/2019 09:10    Medications: I have reviewed the patient's current medications.   Assessment/Plan: 1. 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 Colonoscopy 03/07/2019-nearly completely obstructing mass beginning at 16 cm from the anal verge, sigmoid colon polyp ? CTs 03/15/2019-wall thickening of the lower sigmoid colon, low rectal mass with significant luminal narrowing, diffuse liver metastases, borderline enlarged sigmoid mesocolon, iliac, and upper abdominal lymph nodes ? Elevated CEA ? 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 Cycle 1 FOLFOX 04/11/2019 ? Cycle 2 FOLFOX 05/28/2019 ? Cycle 3 FOLFOX 06/11/2019 ? Cycle 4 FOLFOX 06/25/2019 ? CT abdomen/pelvis 07/10/2019-severe obstructing stricture at the sigmoid colon, stable to slight improvement in hepatic metastatic disease compared to February 2021 ? Cycle 5 FOLFOX 07/31/2019 ? Cycle6FOLFOX 08/13/2019 ? Cycle 7 FOLFOX 08/27/2019 ? CT abdomen/pelvis 09/12/2019-stable and slightly decreased  hepatic lesions, no evidence of progressive metastatic disease, slight enlargement of a previously noted pancreas head/uncinate lesion, no evidence of colonic obstruction, stable small  mesenteric and retroperitoneal nodes ? Cycle8FOLFOX 09/26/2019, oxaliplatin held secondary to persistent orthostatic hypotension ? Cycle 9 FOLFOX 10/14/2019, oxaliplatin on hold secondary to persistent orthostatic hypotension ? Cycle 10 FOLFOX 10/28/2019, oxaliplatin on hold secondary to persistent orthostatic hypotension ? Cycle 11 FOLFOX 11/11/2019, oxaliplatin on hold secondary to persistent orthostatic hypotension ? Cycle 12 FOLFOX 11/25/2019, oxaliplatin on hold secondary to persistent orthostatic hypotension ? CT abdomen/pelvis 12/05/2019-increased size and number of liver metastases, mural thickening in the posterior bladder with enhancement, stable small cystic lesion in the pancreas head, stable sigmoid stent without evidence of obstruction 2. 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 Diabetes 4. Coronary artery disease, status post coronary artery bypass surgery in 2017 5. Neutropenia following cycle 1 FOLFOX 6. Admission 04/28/2019 with COVID-19 infection 7. C. difficile colitis 04/28/2019 8. Atrial flutter during hospital admission March/April 2021-treated with a Cardizem drip, digoxin, and Eliquis anticoagulation. Converted to metoprolol at discharge.  Admission with recurrent rapid atrial fibrillation/flutter 07/09/2019 9. 07/09/2019-hospital admission for bowel obstruction  Sigmoidoscopy 07/12/2019-completely obstructing mass at 17-20 cm proximal to the anus-oozing present, stent placed 10. Fall 07/24/2019 with acute kidney injury/rhabdomyolysis 11. 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  Florinef 0.1 mg daily beginning 11/11/2019. 13.Right foot drop is likely due to weight loss/nerve compression 14. C. 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  He has been treated with systemic therapy since March of this year.  The CEA is higher and the restaging CT is consistent with disease progression involving the liver.  I discussed the CT findings and reviewed the images with Clarence Andrews.  We discussed treatment options including comfort care versus a trial of second line systemic therapy.  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  He continues to have orthostasis, potentially related to toxicity from oxaliplatin.  Clarence Andrews indicates he would like to proceed with salvage systemic therapy.  He understands no therapy will be curative.  We discussed an approximate 20-25% chance of a clinical response with second line FOLFIRI/bevacizumab.  I reviewed potential toxicities associated with the FOLFIRI regimen including the chance for acute/delayed diarrhea, alopecia, hematologic toxicity, mucositis, and abdominal pain.  We discussed the hypertension, bleeding, thromboembolic disease, bowel perforation, and delayed wound healing associated with bevacizumab.  He agrees to proceed with this regimen.  Clarence Andrews will complete cycle 1 FOLFIRI/bevacizumab today.  He will return for an office visit in the next cycle of chemotherapy in 2 weeks.  Betsy Coder, MD  12/09/2019  2:15 PM

## 2019-12-09 NOTE — Progress Notes (Signed)
DISCONTINUE ON PATHWAY REGIMEN - Colorectal     A cycle is every 14 days:     Oxaliplatin      Leucovorin      Fluorouracil      Fluorouracil   **Always confirm dose/schedule in your pharmacy ordering system**  REASON: Disease Progression PRIOR TREATMENT: [id number]: mFOLFOX6 q14 Days TREATMENT RESPONSE: Stable Disease (SD)  START ON PATHWAY REGIMEN - Colorectal     A cycle is every 14 days:     Bevacizumab-xxxx      Irinotecan      Leucovorin      Fluorouracil      Fluorouracil   **Always confirm dose/schedule in your pharmacy ordering system**  Patient Characteristics: Distant Metastases, Nonsurgical Candidate, KRAS/NRAS Mutation Positive/Unknown (BRAF V600 Wild-Type/Unknown), Standard Cytotoxic Therapy, Second Line Standard Cytotoxic Therapy, Bevacizumab Eligible Tumor Location: Colon Therapeutic Status: Distant Metastases Microsatellite/Mismatch Repair Status: MSS/pMMR BRAF Mutation Status: Wild-Type (no mutation) KRAS/NRAS Mutation Status: Mutation Positive Standard Cytotoxic Line of Therapy: Second Line Standard Cytotoxic Therapy Bevacizumab Eligibility: Eligible Intent of Therapy: Non-Curative / Palliative Intent, Discussed with Patient

## 2019-12-09 NOTE — Patient Instructions (Signed)
[facility] Discharge Instructions for Patients Receiving Chemotherapy  Today you received the following chemotherapy agents Bevacizumab; Irinotecan; Leucovorin; fluorouracil   To help prevent nausea and vomiting after your treatment, we encourage you to take your nausea medication as directed.    If you develop nausea and vomiting that is not controlled by your nausea medication, call the clinic.   BELOW ARE SYMPTOMS THAT SHOULD BE REPORTED IMMEDIATELY:  *FEVER GREATER THAN 100.5 F2  *CHILLS WITH OR WITHOUT FEVER  NAUSEA AND VOMITING THAT IS NOT CONTROLLED WITH YOUR NAUSEA MEDICATION  *UNUSUAL SHORTNESS OF BREATH  *UNUSUAL BRUISING OR BLEEDING  TENDERNESS IN MOUTH AND THROAT WITH OR WITHOUT PRESENCE OF ULCERS  *URINARY PROBLEMS  *BOWEL PROBLEMS  UNUSUAL RASH Items with * indicate a potential emergency and should be followed up as soon as possible.  Feel free to call the clinic should you have any questions or concerns. The clinic phone number is [phone].  Please show the [facility] at check-in to the Emergency Department and triage nurse.  Bevacizumab injection What is this medicine? BEVACIZUMAB (be va SIZ yoo mab) is a monoclonal antibody. It is used to treat many types of cancer. This medicine may be used for other purposes; ask your health care provider or pharmacist if you have questions. COMMON BRAND NAME(S): Avastin, MVASI, Zirabev What should I tell my health care provider before I take this medicine? They need to know if you have any of these conditions:  diabetes  heart disease  high blood pressure  history of coughing up blood  prior anthracycline chemotherapy (e.g., doxorubicin, daunorubicin, epirubicin)  recent or ongoing radiation therapy  recent or planning to have surgery  stroke  an unusual or allergic reaction to bevacizumab, hamster proteins, mouse proteins, other medicines, foods, dyes, or  preservatives  pregnant or trying to get pregnant  breast-feeding How should I use this medicine? This medicine is for infusion into a vein. It is given by a health care professional in a hospital or clinic setting. Talk to your pediatrician regarding the use of this medicine in children. Special care may be needed. Overdosage: If you think you have taken too much of this medicine contact a poison control center or emergency room at once. NOTE: This medicine is only for you. Do not share this medicine with others. What if I miss a dose? It is important not to miss your dose. Call your doctor or health care professional if you are unable to keep an appointment. What may interact with this medicine? Interactions are not expected. This list may not describe all possible interactions. Give your health care provider a list of all the medicines, herbs, non-prescription drugs, or dietary supplements you use. Also tell them if you smoke, drink alcohol, or use illegal drugs. Some items may interact with your medicine. What should I watch for while using this medicine? Your condition will be monitored carefully while you are receiving this medicine. You will need important blood work and urine testing done while you are taking this medicine. This medicine may increase your risk to bruise or bleed. Call your doctor or health care professional if you notice any unusual bleeding. Before having surgery, talk to your health care provider to make sure it is ok. This drug can increase the risk of poor healing of your surgical site or wound. You will need to stop this drug for 28 days before surgery. After surgery, wait at least 28 days before restarting this  drug. Make sure the surgical site or wound is healed enough before restarting this drug. Talk to your health care provider if questions. Do not become pregnant while taking this medicine or for 6 months after stopping it. Women should inform their doctor if  they wish to become pregnant or think they might be pregnant. There is a potential for serious side effects to an unborn child. Talk to your health care professional or pharmacist for more information. Do not breast-feed an infant while taking this medicine and for 6 months after the last dose. This medicine has caused ovarian failure in some women. This medicine may interfere with the ability to have a child. You should talk to your doctor or health care professional if you are concerned about your fertility. What side effects may I notice from receiving this medicine? Side effects that you should report to your doctor or health care professional as soon as possible:  allergic reactions like skin rash, itching or hives, swelling of the face, lips, or tongue  chest pain or chest tightness  chills  coughing up blood  high fever  seizures  severe constipation  signs and symptoms of bleeding such as bloody or black, tarry stools; red or dark-brown urine; spitting up blood or brown material that looks like coffee grounds; red spots on the skin; unusual bruising or bleeding from the eye, gums, or nose  signs and symptoms of a blood clot such as breathing problems; chest pain; severe, sudden headache; pain, swelling, warmth in the leg  signs and symptoms of a stroke like changes in vision; confusion; trouble speaking or understanding; severe headaches; sudden numbness or weakness of the face, arm or leg; trouble walking; dizziness; loss of balance or coordination  stomach pain  sweating  swelling of legs or ankles  vomiting  weight gain Side effects that usually do not require medical attention (report to your doctor or health care professional if they continue or are bothersome):  back pain  changes in taste  decreased appetite  dry skin  nausea  tiredness This list may not describe all possible side effects. Call your doctor for medical advice about side effects. You may  report side effects to FDA at 1-800-FDA-1088. Where should I keep my medicine? This drug is given in a hospital or clinic and will not be stored at home. NOTE: This sheet is a summary. It may not cover all possible information. If you have questions about this medicine, talk to your doctor, pharmacist, or health care provider.  2020 Elsevier/Gold Standard (2018-11-14 10:50:46) Irinotecan injection What is this medicine? IRINOTECAN (ir in oh TEE kan ) is a chemotherapy drug. It is used to treat colon and rectal cancer. This medicine may be used for other purposes; ask your health care provider or pharmacist if you have questions. COMMON BRAND NAME(S): Camptosar What should I tell my health care provider before I take this medicine? They need to know if you have any of these conditions:  dehydration  diarrhea  infection (especially a virus infection such as chickenpox, cold sores, or herpes)  liver disease  low blood counts, like low white cell, platelet, or red cell counts  low levels of calcium, magnesium, or potassium in the blood  recent or ongoing radiation therapy  an unusual or allergic reaction to irinotecan, other medicines, foods, dyes, or preservatives  pregnant or trying to get pregnant  breast-feeding How should I use this medicine? This drug is given as an infusion into a  vein. It is administered in a hospital or clinic by a specially trained health care professional. Talk to your pediatrician regarding the use of this medicine in children. Special care may be needed. Overdosage: If you think you have taken too much of this medicine contact a poison control center or emergency room at once. NOTE: This medicine is only for you. Do not share this medicine with others. What if I miss a dose? It is important not to miss your dose. Call your doctor or health care professional if you are unable to keep an appointment. What may interact with this medicine? This medicine  may interact with the following medications:  antiviral medicines for HIV or AIDS  certain antibiotics like rifampin or rifabutin  certain medicines for fungal infections like itraconazole, ketoconazole, posaconazole, and voriconazole  certain medicines for seizures like carbamazepine, phenobarbital, phenotoin  clarithromycin  gemfibrozil  nefazodone  [place]'s Wort This list may not describe all possible interactions. Give your health care provider a list of all the medicines, herbs, non-prescription drugs, or dietary supplements you use. Also tell them if you smoke, drink alcohol, or use illegal drugs. Some items may interact with your medicine. What should I watch for while using this medicine? Your condition will be monitored carefully while you are receiving this medicine. You will need important blood work done while you are taking this medicine. This drug may make you feel generally unwell. This is not uncommon, as chemotherapy can affect healthy cells as well as cancer cells. Report any side effects. Continue your course of treatment even though you feel ill unless your doctor tells you to stop. In some cases, you may be given additional medicines to help with side effects. Follow all directions for their use. You may get drowsy or dizzy. Do not drive, use machinery, or do anything that needs mental alertness until you know how this medicine affects you. Do not stand or sit up quickly, especially if you are an older patient. This reduces the risk of dizzy or fainting spells. Call your health care professional for advice if you get a fever, chills, or sore throat, or other symptoms of a cold or flu. Do not treat yourself. This medicine decreases your body's ability to fight infections. Try to avoid being around people who are sick. Avoid taking products that contain aspirin, acetaminophen, ibuprofen, naproxen, or ketoprofen unless instructed by your doctor. These medicines may hide a  fever. This medicine may increase your risk to bruise or bleed. Call your doctor or health care professional if you notice any unusual bleeding. Be careful brushing and flossing your teeth or using a toothpick because you may get an infection or bleed more easily. If you have any dental work done, tell your dentist you are receiving this medicine. Do not become pregnant while taking this medicine or for 6 months after stopping it. Women should inform their health care professional if they wish to become pregnant or think they might be pregnant. Men should not father a child while taking this medicine and for 3 months after stopping it. There is potential for serious side effects to an unborn child. Talk to your health care professional for more information. Do not breast-feed an infant while taking this medicine or for 7 days after stopping it. This medicine has caused ovarian failure in some women. This medicine may make it more difficult to get pregnant. Talk to your health care professional if you are concerned about your fertility. This medicine  has caused decreased sperm counts in some men. This may make it more difficult to father a child. Talk to your health care professional if you are concerned about your fertility. What side effects may I notice from receiving this medicine? Side effects that you should report to your doctor or health care professional as soon as possible:  allergic reactions like skin rash, itching or hives, swelling of the face, lips, or tongue  chest pain  diarrhea  flushing, runny nose, sweating during infusion  low blood counts - this medicine may decrease the number of white blood cells, red blood cells and platelets. You may be at increased risk for infections and bleeding.  nausea, vomiting  pain, swelling, warmth in the leg  signs of decreased platelets or bleeding - bruising, pinpoint red spots on the skin, black, tarry stools, blood in the urine  signs  of infection - fever or chills, cough, sore throat, pain or difficulty passing urine  signs of decreased red blood cells - unusually weak or tired, fainting spells, lightheadedness Side effects that usually do not require medical attention (report to your doctor or health care professional if they continue or are bothersome):  constipation  hair loss  headache  loss of appetite  mouth sores  stomach pain This list may not describe all possible side effects. Call your doctor for medical advice about side effects. You may report side effects to FDA at 1-800-FDA-1088. Where should I keep my medicine? This drug is given in a hospital or clinic and will not be stored at home. NOTE: This sheet is a summary. It may not cover all possible information. If you have questions about this medicine, talk to your doctor, pharmacist, or health care provider.  2020 Elsevier/Gold Standard (2018-03-09 10:09:17)

## 2019-12-10 ENCOUNTER — Telehealth: Payer: Self-pay | Admitting: Oncology

## 2019-12-10 ENCOUNTER — Encounter: Payer: Self-pay | Admitting: *Deleted

## 2019-12-10 NOTE — Telephone Encounter (Signed)
Scheduled appointments per 11/8 los. Called patient, no answer. Voicemail was full so was unable to leave a message. Mailed updated calendar to patient with appointments date and times.

## 2019-12-10 NOTE — Progress Notes (Signed)
Faxed 11/8 office note, labs and recent CT with information on irinotecan and avastin to [facility] # [phone]

## 2019-12-10 NOTE — Addendum Note (Signed)
Addended by: Tania Ade on: 12/10/2019 04:57 PM   Modules accepted: Orders

## 2019-12-11 ENCOUNTER — Encounter (HOSPITAL_COMMUNITY): Payer: Self-pay | Admitting: Internal Medicine

## 2019-12-11 ENCOUNTER — Observation Stay (HOSPITAL_COMMUNITY)
Admission: EM | Admit: 2019-12-11 | Discharge: 2019-12-13 | Disposition: A | Discharge status: Home / Self Care | Payer: Medicare Other | Attending: Internal Medicine | Admitting: Internal Medicine

## 2019-12-11 ENCOUNTER — Other Ambulatory Visit: Payer: Self-pay

## 2019-12-11 ENCOUNTER — Inpatient Hospital Stay: Payer: Medicare Other

## 2019-12-11 ENCOUNTER — Emergency Department (HOSPITAL_COMMUNITY): Payer: Medicare Other

## 2019-12-11 ENCOUNTER — Other Ambulatory Visit: Payer: Self-pay | Admitting: Nurse Practitioner

## 2019-12-11 VITALS — BP 56/40 | HR 79

## 2019-12-11 DIAGNOSIS — R112 Nausea with vomiting, unspecified: Secondary | ICD-10-CM | POA: Diagnosis not present

## 2019-12-11 DIAGNOSIS — D72829 Elevated white blood cell count, unspecified: Secondary | ICD-10-CM | POA: Diagnosis not present

## 2019-12-11 DIAGNOSIS — I951 Orthostatic hypotension: Secondary | ICD-10-CM | POA: Diagnosis not present

## 2019-12-11 DIAGNOSIS — C187 Malignant neoplasm of sigmoid colon: Secondary | ICD-10-CM

## 2019-12-11 DIAGNOSIS — C189 Malignant neoplasm of colon, unspecified: Secondary | ICD-10-CM

## 2019-12-11 DIAGNOSIS — I2510 Atherosclerotic heart disease of native coronary artery without angina pectoris: Secondary | ICD-10-CM | POA: Insufficient documentation

## 2019-12-11 DIAGNOSIS — R031 Nonspecific low blood-pressure reading: Secondary | ICD-10-CM | POA: Diagnosis present

## 2019-12-11 DIAGNOSIS — Z85818 Personal history of malignant neoplasm of other sites of lip, oral cavity, and pharynx: Secondary | ICD-10-CM | POA: Diagnosis not present

## 2019-12-11 DIAGNOSIS — K922 Gastrointestinal hemorrhage, unspecified: Secondary | ICD-10-CM | POA: Insufficient documentation

## 2019-12-11 DIAGNOSIS — I110 Hypertensive heart disease with heart failure: Secondary | ICD-10-CM | POA: Insufficient documentation

## 2019-12-11 DIAGNOSIS — Z7984 Long term (current) use of oral hypoglycemic drugs: Secondary | ICD-10-CM | POA: Diagnosis not present

## 2019-12-11 DIAGNOSIS — J189 Pneumonia, unspecified organism: Secondary | ICD-10-CM

## 2019-12-11 DIAGNOSIS — E119 Type 2 diabetes mellitus without complications: Secondary | ICD-10-CM | POA: Diagnosis not present

## 2019-12-11 DIAGNOSIS — Z20822 Contact with and (suspected) exposure to covid-19: Secondary | ICD-10-CM | POA: Diagnosis not present

## 2019-12-11 DIAGNOSIS — Z85038 Personal history of other malignant neoplasm of large intestine: Secondary | ICD-10-CM | POA: Insufficient documentation

## 2019-12-11 DIAGNOSIS — Z8616 Personal history of COVID-19: Secondary | ICD-10-CM | POA: Insufficient documentation

## 2019-12-11 DIAGNOSIS — Z5112 Encounter for antineoplastic immunotherapy: Secondary | ICD-10-CM | POA: Diagnosis not present

## 2019-12-11 DIAGNOSIS — C186 Malignant neoplasm of descending colon: Secondary | ICD-10-CM

## 2019-12-11 DIAGNOSIS — I4892 Unspecified atrial flutter: Secondary | ICD-10-CM | POA: Diagnosis present

## 2019-12-11 DIAGNOSIS — Z79899 Other long term (current) drug therapy: Secondary | ICD-10-CM | POA: Diagnosis not present

## 2019-12-11 DIAGNOSIS — Z95828 Presence of other vascular implants and grafts: Secondary | ICD-10-CM

## 2019-12-11 DIAGNOSIS — I5023 Acute on chronic systolic (congestive) heart failure: Secondary | ICD-10-CM | POA: Insufficient documentation

## 2019-12-11 LAB — CBC WITH DIFFERENTIAL/PLATELET
Abs Immature Granulocytes: 0.05 10*3/uL (ref 0.00–0.07)
Basophils Absolute: 0 10*3/uL (ref 0.0–0.1)
Basophils Relative: 0 %
Eosinophils Absolute: 0 10*3/uL (ref 0.0–0.5)
Eosinophils Relative: 0 %
HCT: 29.8 % — ABNORMAL LOW (ref 39.0–52.0)
Hemoglobin: 9.1 g/dL — ABNORMAL LOW (ref 13.0–17.0)
Immature Granulocytes: 0 %
Lymphocytes Relative: 8 %
Lymphs Abs: 0.9 10*3/uL (ref 0.7–4.0)
MCH: 26.8 pg (ref 26.0–34.0)
MCHC: 30.5 g/dL (ref 30.0–36.0)
MCV: 87.9 fL (ref 80.0–100.0)
Monocytes Absolute: 0.3 10*3/uL (ref 0.1–1.0)
Monocytes Relative: 2 %
Neutro Abs: 10 10*3/uL — ABNORMAL HIGH (ref 1.7–7.7)
Neutrophils Relative %: 90 %
Platelets: 301 10*3/uL (ref 150–400)
RBC: 3.39 MIL/uL — ABNORMAL LOW (ref 4.22–5.81)
RDW: 15.2 % (ref 11.5–15.5)
WBC: 11.2 10*3/uL — ABNORMAL HIGH (ref 4.0–10.5)
nRBC: 0 % (ref 0.0–0.2)

## 2019-12-11 LAB — BASIC METABOLIC PANEL
Anion gap: 12 (ref 5–15)
BUN: 25 mg/dL — ABNORMAL HIGH (ref 8–23)
CO2: 26 mmol/L (ref 22–32)
Calcium: 8.4 mg/dL — ABNORMAL LOW (ref 8.9–10.3)
Chloride: 100 mmol/L (ref 98–111)
Creatinine, Ser: 1.1 mg/dL (ref 0.61–1.24)
GFR, Estimated: >60 mL/min (ref >60)
Glucose, Bld: 225 mg/dL — ABNORMAL HIGH (ref 70–99)
Potassium: 4 mmol/L (ref 3.5–5.1)
Sodium: 138 mmol/L (ref 135–145)

## 2019-12-11 LAB — OCCULT BLOOD GASTRIC / DUODENUM (SPECIMEN CUP)
Occult Blood, Gastric: POSITIVE — AB
pH, Gastric: 6

## 2019-12-11 LAB — RESPIRATORY PANEL BY RT PCR (FLU A&B, COVID)
Influenza A by PCR: NEGATIVE
Influenza B by PCR: NEGATIVE
SARS Coronavirus 2 by RT PCR: NEGATIVE

## 2019-12-11 MED ORDER — SODIUM CHLORIDE 0.9% FLUSH
10.0000 mL | Freq: Once | INTRAVENOUS | Status: DC
  Filled 2019-12-11: qty 10

## 2019-12-11 MED ORDER — AMOXICILLIN-POT CLAVULANATE 875-125 MG PO TABS
1.0000 | ORAL_TABLET | Freq: Once | ORAL | Status: AC
  Administered 2019-12-11: 1 via ORAL
  Filled 2019-12-11: qty 1

## 2019-12-11 MED ORDER — SODIUM CHLORIDE (PF) 0.9 % IJ SOLN
INTRAMUSCULAR | Status: AC
  Filled 2019-12-11: qty 50

## 2019-12-11 MED ORDER — PANTOPRAZOLE SODIUM 40 MG IV SOLR
40.0000 mg | Freq: Once | INTRAVENOUS | Status: AC
  Administered 2019-12-11: 40 mg via INTRAVENOUS
  Filled 2019-12-11: qty 40

## 2019-12-11 MED ORDER — HEPARIN SOD (PORK) LOCK FLUSH 100 UNIT/ML IV SOLN
500.0000 [IU] | Freq: Once | INTRAVENOUS | Status: DC
  Filled 2019-12-11: qty 5

## 2019-12-11 MED ORDER — ONDANSETRON HCL 4 MG/2ML IJ SOLN
4.0000 mg | Freq: Once | INTRAMUSCULAR | Status: AC
  Administered 2019-12-11: 4 mg via INTRAVENOUS
  Filled 2019-12-11: qty 2

## 2019-12-11 MED ORDER — AMOXICILLIN-POT CLAVULANATE 875-125 MG PO TABS: 1.0000 | ORAL_TABLET | Freq: Two times a day (BID) | ORAL | 0 refills | Status: DC

## 2019-12-11 MED ORDER — SODIUM CHLORIDE 0.9 % IV SOLN: 4.0000 mg | Freq: Once | INTRAVENOUS | Status: DC

## 2019-12-11 MED ORDER — ONDANSETRON HCL 4 MG/2ML IJ SOLN
4.0000 mg | Freq: Four times a day (QID) | INTRAMUSCULAR | Status: DC | PRN
  Filled 2019-12-11: qty 2

## 2019-12-11 MED ORDER — SODIUM CHLORIDE 0.9 % IV SOLN
INTRAVENOUS | Status: DC
  Filled 2019-12-11: qty 250

## 2019-12-11 MED ORDER — IOHEXOL 350 MG/ML SOLN
100.0000 mL | Freq: Once | INTRAVENOUS | Status: AC | PRN
  Administered 2019-12-11: 47 mL via INTRAVENOUS

## 2019-12-11 MED ORDER — DEXAMETHASONE SODIUM PHOSPHATE 10 MG/ML IJ SOLN: 4.0000 mg | Freq: Once | INTRAMUSCULAR | Status: DC

## 2019-12-11 MED ORDER — SODIUM CHLORIDE 0.9 % IV BOLUS
1000.0000 mL | Freq: Once | INTRAVENOUS | Status: AC
  Administered 2019-12-11: 1000 mL via INTRAVENOUS

## 2019-12-11 MED ORDER — HEPARIN SOD (PORK) LOCK FLUSH 100 UNIT/ML IV SOLN
500.0000 [IU] | Freq: Once | INTRAVENOUS | Status: AC | PRN
  Administered 2019-12-11: 500 [IU]
  Filled 2019-12-11: qty 5

## 2019-12-11 MED ORDER — SODIUM CHLORIDE 0.9% FLUSH
10.0000 mL | INTRAVENOUS | Status: DC | PRN
  Administered 2019-12-11: 10 mL
  Filled 2019-12-11: qty 10

## 2019-12-11 NOTE — ED Notes (Signed)
[facility] called for transport.  

## 2019-12-11 NOTE — Discharge Instructions (Addendum)
Suspect that today you had ongoing orthostasis.  Lab work has looked unremarkable.  Please continue management outpatient with oncology.  Your CT scan showed a possible pneumonia.  Take the antibiotics as prescribed.  Get rechecked if you develop difficulty breathing or new concerning symptoms.

## 2019-12-11 NOTE — ED Notes (Signed)
Attempted to call report to [facility].  Rang multiple times but no answer.

## 2019-12-11 NOTE — ED Notes (Signed)
Resting comfortably, talking on cell phone. Advises he feels fine. Awaiting disposition.

## 2019-12-11 NOTE — Progress Notes (Signed)
Patient was brought to the port flush room following 5FU pump d/c due to nausea and vomiting in the waiting room. Port reaccessed using sterile technique, flushes easily with brisk blood return. Pt found to be hypotensive, see flowsheets for VS. NS bolus was initiated per orders and rapid response called and patient was transported to the ER for further management. Report to [place], Therapist, sports.

## 2019-12-11 NOTE — ED Notes (Signed)
[facility] placed on hold until more testing

## 2019-12-11 NOTE — ED Notes (Addendum)
Assumed care of patient at this time, nad noted, sr up x2, bed locked and low, call bell w/I reach.  Will continue to monitor.  Awaiting transport back to the facility.  Patient vomiting, provider made aware.

## 2019-12-11 NOTE — ED Triage Notes (Signed)
Sent from Cancer center for Hypotension while receiving chemo. Pt states he has been orthostatic for "months". Denies any n/v/d.
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
## 2019-12-11 NOTE — Addendum Note (Signed)
Addended by: Kasandra Knudsen A on: 12/11/2019 02:10 PM   Modules accepted: Orders

## 2019-12-11 NOTE — H&P (Signed)
History and Physical    PLEASE NOTE THAT DRAGON DICTATION SOFTWARE WAS USED IN THE CONSTRUCTION OF THIS NOTE.   [name] OZH:[id number] DOB: [birth year] DOA: 12/11/2019  PCP: Patient, No Pcp Per Patient coming from: home   I have personally briefly reviewed patient's old medical records in [place]  Chief Complaint: Hypotension  HPI: Clarence Andrews. is a 69 y.o. male with medical history significant for metastatic colon cancer on chemotherapy, paroxysmal atrial fibrillation not anticoagulated, coronary artery disease status post CABG in 2017, chronic anemia with baseline hemoglobin 8-9, recurrent orthostatic hypotension on midodrine, type 2 diabetes mellitus, who is admitted to [facility] on 12/11/2019 with acute upper GI bleed after presenting from home to [facility] Emergency Department for evaluation of hypotension.   In the setting of metastatic colon cancer, the patient underwent chemotherapy on 12/09/2019 and subsequently followed up routinely in oncology clinic earlier today.  At that time, there was some concern of low blood pressures, prompting the patient to be sent to [facility] long emergency department for further evaluation.   He denies any recent subjective fever, chills, rigors, or generalized myalgias.  Denies any recent headache, neck stiffness, rhinitis, rhinorrhea, sore throat, shortness of breath, cough, wheezing, abdominal pain, diarrhea, or rash.  No recent traveling or known COVID-19 exposures.  Denies any recent dysuria, gross hematuria, or change in urinary urgency/frequency.  Denies any recent chest pain, diaphoresis, palpitations, presyncope, syncope, or dizziness.  The patient reports intermittent nausea following his most recent chemotherapy on 12/09/2019, with associated one episode of dark-colored emesis that was witnessed in the emergency department today.  The patient reports that 1-2 episodes of dark-colored emesis following  chemotherapy is typical for him, and notes no change in quality, volume, or appearance of emesis in the ED today relative to that which is typical to him following chemotherapy.  Denies any associated melena or hematochezia.  Denies any routine NSAID use.  Past medical history notable for paroxysmal atrial fibrillation/flutter for which he confirms that he is on no blood thinning agents as an outpatient, including no aspirin. No recent trauma.  Denies any known history of gastrointestinal bleed.  Of note, most recent echocardiogram was performed in June 2021 and showed LVEF 55 to 60% and indeterminate diastolic parameters.  He is not on any diuretics at home.  Denies any recent orthopnea, PND, or peripheral edema.  Per chart review, it appears as patient has a history of recurrent hypotension, including recurrent orthostatic hypotension for which she is on midodrine at home.  Not on any antihypertensive medications as an outpatient.  Per chart review,     ED Course:  Vital signs in the ED were notable for the following: Temperature max 97.9; heart rate 83-95; blood pressure 111/70 - 142/85; respiratory 14-21; oxygen saturation 95 to 97% on room air.  Labs were notable for the following: BMP notable for the following: Sodium 138, potassium 4.0, BUN 25 relative to 21 on 12/09/2019 and 24 on 11/25/2019, creatinine 1.1 relative to 1.14 on 12/09/2019.  CBC notable for the following: White blood cell count of 11,200, hemoglobin 9.1 relative to 8.8 on 12/09/2019 and 8.5 on 11/25/2019; this evening's hemoglobin is associated with MCV 88, MCHC 30.5, and RDW 15.2.  Vomitus collected in the ED this evening was found to be Hemoccult positive.  In the setting of underlying malignancy and hypotension observed in outpatient clinic earlier today, CTA of the chest was checked in the ED, and showed no  evidence of acute pulmonary embolism, while showing clusters of groundglass nodularity in the left lower lobe, which  appears to be improving relative to CTA of the chest performed on 04/28/2019.  EKG shows sinus rhythm with heart rate 85, QTc 439 ms, no evidence of T wave or ST changes, and unchanged Q waves in leads III and aVF relative to most recent prior EKG on 09/11/2019.  While in the ED, the following were administered: Zofran 4 mg IV x1, Protonix 40 mg IV x1, and a 1 L normal saline bolus.  Additionally, in the setting of nodularity of the left lower lobe, the patient was also given a dose of Augmentin.  Subsequently, the patient was admitted for overnight observation for further evaluation and management of potential acute upper gastrointestinal bleed.    Review of Systems: As per HPI otherwise 10 point review of systems negative.   Past Medical History:  Diagnosis Date  . CAD (coronary artery disease) 2017   CABG  . Cancer ([place])   . CHF (congestive heart failure) ([place])   . Colitis   . COVID-19 04/29/2019  . Diabetes ([place])   . Paronychia of second toe of right foot   . Throat cancer [facility])     Past Surgical History:  Procedure Laterality Date  . APPENDECTOMY  1970  . COLONIC STENT PLACEMENT N/A 07/12/2019   Procedure: COLONIC STENT PLACEMENT;  Surgeon: Irving Copas., MD;  Location: [facility];  Service: Gastroenterology;  Laterality: N/A;  . CORONARY ARTERY BYPASS GRAFT     5 vessel   . FLEXIBLE SIGMOIDOSCOPY N/A 07/12/2019   Procedure: FLEXIBLE SIGMOIDOSCOPY;  Surgeon: Rush Landmark Telford Nab., MD;  Location: [facility];  Service: Gastroenterology;  Laterality: N/A;  . IR IMAGING GUIDED PORT INSERTION  04/04/2019  . TOOTH EXTRACTION      Social History:  reports that he has never smoked. He has never used smokeless tobacco. He reports previous alcohol use. He reports that he does not use drugs.   No Known Allergies  Family History  Problem Relation Age of Onset  . Breast cancer Mother   . Colon polyps Father   . Heart disease Father   . Colon cancer Neg Hx   . Esophageal  cancer Neg Hx   . Rectal cancer Neg Hx   . Stomach cancer Neg Hx      Prior to Admission medications   Medication Sig Start Date End Date Taking? Authorizing Provider  acetaminophen (TYLENOL) 325 MG tablet Take 650 mg by mouth every 6 (six) hours as needed.   Yes [provider]  amoxicillin-clavulanate (AUGMENTIN) 875-125 MG tablet Take 1 tablet by mouth every 12 (twelve) hours. 12/11/19   Quintella Reichert, MD  ascorbic acid (VITAMIN C) 500 MG tablet Take 1 tablet (500 mg total) by mouth daily. 05/05/19   Eugenie Filler, MD  calcium carbonate (OSCAL) 1500 (600 Ca) MG TABS tablet Take 1,500 mg by mouth 2 (two) times daily as needed.    [provider]  Cholecalciferol (D3-1000 PO) Take 1,000 Units by mouth daily.     [provider]  Chromium-Cinnamon [phone] MCG-MG CAPS Take 2,000 mg by mouth daily.    [provider]  digoxin (LANOXIN) 0.25 MG tablet Take 1 tablet (0.25 mg total) by mouth daily. 07/25/19   Samuella Cota, MD  ferrous sulfate 325 (65 FE) MG EC tablet Take 325 mg by mouth daily with breakfast.    [provider]  glipiZIDE (GLUCOTROL) 10 MG  tablet Take 10 mg by mouth daily before breakfast.    [provider]  levalbuterol (XOPENEX HFA) 45 MCG/ACT inhaler Inhale 2 puffs into the lungs every 6 (six) hours as needed for wheezing. 05/04/19   Eugenie Filler, MD  loperamide (IMODIUM) 2 MG capsule Take 1 capsule by mouth 4 (four) times daily as needed for diarrhea or loose stools.  Patient not taking: Reported on 12/10/2019    [provider]  magnesium oxide (MAG-OX) 400 MG tablet Take 800 mg by mouth daily.     [provider]  midodrine (PROAMATINE) 10 MG tablet Take 1 tablet (10 mg total) by mouth 3 (three) times daily with meals. 09/21/19   Dwyane Dee, MD  Multiple Vitamin (MULTIVITAMIN WITH MINERALS) TABS tablet Take 1 tablet by mouth daily. 09/22/19   Dwyane Dee, MD  Multiple Vitamins-Minerals  (DECUBI-VITE) CAPS Take 1 capsule by mouth daily.    [provider]  PROPYLENE GLYCOL OP Place 1 drop into both eyes as needed.    [provider]  Turmeric 500 MG CAPS Take 1,000 mg by mouth 2 (two) times daily.     [provider]  vitamin B-12 (CYANOCOBALAMIN) 500 MCG tablet Take 500 mcg by mouth daily.    [provider]     Objective    Physical Exam: Vitals:   12/11/19 1815 12/11/19 1830 12/11/19 1900 12/11/19 1940  BP:  (!) 142/85 129/84 (!) 150/95  Pulse: 93 93  97  Resp: 16 16 (!) 21 20  Temp:    97.9 F (36.6 C)  TempSrc:    Oral  SpO2: 100% 100%  98%    General: appears to be stated age; alert, oriented Skin: warm, dry, no rash Head:  AT/[place] Mouth:  Oral mucosa membranes appear dry, normal dentition Neck: supple; trachea midline Heart:  RRR; did not appreciate any M/R/G Lungs: CTAB, did not appreciate any wheezes, rales, or rhonchi Abdomen: + BS; soft, ND, NT Vascular: 2+ pedal pulses b/l; 2+ radial pulses b/l Extremities: no peripheral edema, no muscle wasting Neuro: strength and sensation intact in upper and lower extremities b/l   Labs on Admission: I have personally reviewed following labs and imaging studies  CBC: Recent Labs  Lab 12/09/19 0842 12/11/19 1441  WBC 6.6 11.2*  NEUTROABS 4.4 10.0*  HGB 8.8* 9.1*  HCT 29.1* 29.8*  MCV 86.1 87.9  PLT 320 542   Basic Metabolic Panel: Recent Labs  Lab 12/09/19 0842 12/11/19 1441  NA 139 138  K 4.1 4.0  CL 102 100  CO2 27 26  GLUCOSE 279* 225*  BUN 21 25*  CREATININE 1.14 1.10  CALCIUM 8.9 8.4*  MG 1.6*  --    GFR: Estimated Creatinine Clearance: 71.6 mL/min (by C-G formula based on SCr of 1.1 mg/dL). Liver Function Tests: Recent Labs  Lab 12/09/19 0842  AST 23  ALT 16  ALKPHOS 154*  BILITOT 0.3  PROT 6.6  ALBUMIN 2.7*   No results for input(s): LIPASE, AMYLASE in the last 168 hours. No results for input(s): AMMONIA in the last 168  hours. Coagulation Profile: No results for input(s): INR, PROTIME in the last 168 hours. Cardiac Enzymes: No results for input(s): CKTOTAL, CKMB, CKMBINDEX, TROPONINI in the last 168 hours. BNP (last 3 results) No results for input(s): PROBNP in the last 8760 hours. HbA1C: No results for input(s): HGBA1C in the last 72 hours. CBG: No results for input(s): GLUCAP in the last 168 hours. Lipid Profile: No  results for input(s): CHOL, HDL, LDLCALC, TRIG, CHOLHDL, LDLDIRECT in the last 72 hours. Thyroid Function Tests: No results for input(s): TSH, T4TOTAL, FREET4, T3FREE, THYROIDAB in the last 72 hours. Anemia Panel: No results for input(s): VITAMINB12, FOLATE, FERRITIN, TIBC, IRON, RETICCTPCT in the last 72 hours. Urine analysis:    Component Value Date/Time   COLORURINE YELLOW 09/10/2019 2239   APPEARANCEUR CLOUDY (A) 09/10/2019 2239   LABSPEC 1.017 09/10/2019 2239   PHURINE 5.0 09/10/2019 2239   GLUCOSEU NEGATIVE 09/10/2019 2239   HGBUR LARGE (A) 09/10/2019 2239   BILIRUBINUR NEGATIVE 09/10/2019 2239   KETONESUR 5 (A) 09/10/2019 2239   PROTEINUR 100 (A) 09/10/2019 2239   NITRITE NEGATIVE 09/10/2019 2239   LEUKOCYTESUR LARGE (A) 09/10/2019 2239    Radiological Exams on Admission: No results found.   EKG: Independently reviewed, with result as described above.    Assessment/Plan   Sahej Schrieber. is a 69 y.o. 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    Principal Problem:   Acute upper GI bleed Active Problems:   Type 2 diabetes mellitus  without complication (HCC)   Atrial flutter (HCC)   Orthostatic hypotension   Nausea & vomiting   Leukocytosis    #) Acute upper gastrointestinal bleed: Potential acute on the basis of single episode of dark-colored emesis observed in the ED this evening, with resultant specimen found to be Hemoccult positive.  Of note, BUN appears consistent with prior values, without significant elevation relative to these, as further quantified above.  Not on any blood thinners as an outpatient, and denies any routine NSAID use.  Denies any history of alcohol abuse, and no known liver disease.  Consequently, there does not appear to be an indication for initiation of SBP prophylaxis.  Additionally, presentation and history are less suggestive of variceal bleed, and therefore there does not appear to be an indication for initiation of octreotide.  Patient appears to be hemodynamically stable, with normotensive blood pressures and nontachycardic heart rate in the ED.  Additionally, presenting hemoglobin of 9.1 found to be consistent with baseline range of 8-9, with nonelevated RDW.  With the exception of the after mentioned episode of emesis, the patient appears asymptomatic, denies any chest pain, shortness of breath, dizziness.  Patient denies any history of gastrointestinal bleed.  Given the asymptomatic nature of patient's presentation along with reassuring vital signs and baseline hemoglobin values, I have not yet discussed the case with gastroenterology, rather, engage in close overnight observation, with monitoring on telemetry, repeat hemoglobin level, and Protonix, as further specified below.   Plan: Will keep n.p.o. for now.  Refraining from pharmacologic DVT prophylaxis.  SCDs.  Monitor on telemetry.  Monitor continuous pulse oximetry.  Maintain at least 2 large-bore IVs.  Check INR.  Repeat hemoglobin with a.m. labs.  Will closely monitor ensuing hemoglobin levels and correlate these data points with the  patient's overall clinical picture including vital signs determine need for transfusion.  Type, screen, and hold 2 units PRBC.  Protonix 40 mg IV twice daily.  Repeat BMP and CBC in the morning.  Add on iron studies to determine if patient would benefit from  IV iron transfusion.  As needed IV Zofran.     #) Leukocytosis: White blood cell count noted to be mildly elevated at 11,200.  No evidence of overt underlying infectious process at this time, and patient does not appear to be septic.  Covid 19/influenza PCR checked in the ED this evening were both found to be negative.  We will also check urinalysis.  CTA of the chest performed today showed groundglass nodularity in the left lower lobe, which appears to be improving relative to CTA chest performed in March 2021 at the time of patient's diagnosis of COVID-19.  Given improving radiographic findings and the complete absence of any recent acute respiratory symptoms, suspect that this nodularity represents improving features relative to the time of the aforementioned COVID-19 infection in March 2021.  However, to further evaluate the possibility of underlying acute pulmonary infection, will add on procalcitonin level, repeat CBC with differential, and overall clinically correlate to determine the need for any additional antibiotics.  Of note, the patient was given a single dose of Augmentin in the ED this evening.  I am refraining from empirically providing additional antibiotics given limited clinical evidence to suggest acute infection at this time, while taking into account the potential risk for developing diarrhea in this patient that is already on chemotherapy.  Plan: Check urinalysis.  Add on procalcitonin.  Repeat CBC with differential in the morning.  Close monitoring for development of fever.     #) History of orthostatic hypotension: Documented history of recurrent hypotension including recurrent orthostatic hypotension, for which the patient  is on midodrine as an outpatient.  The patient is exhibited no evidence of hypotension in the ED this evening.  Plan: Continue home midodrine.  Close monitoring of ensuing blood pressure via routine vital signs.  Monitor on telemetry.     #) Paroxysmal atrial fibrillation: Documented history of such. In the setting of a CHA2DS2-VASc score of 4, there is an indication for the patient to be on chronic anticoagulation for thromboembolic prophylaxis.  However, the patient reports that he is on any chronic anticoagulation as an outpatient.  Rationale for this is currently not entirely clear to me. home AV nodal blocking regimen: Digoxin.  Most recent echocardiogram occurred in June 2021, with results further detailed above. Presenting EKG shows sinus rhythm with heart rate 85, no evidence of acute ischemic changes.   Plan: monitor strict I's & O's and daily weights. Repeat BMP in the morning. Check serum magnesium level. Continue home AV nodal blocking regimen, as above.  Will attempt additional chart review to determine rationale behind absence of chronic anticoagulation.  Monitor on telemetry.  Check INR.     #) Type 2 diabetes mellitus: On glipizide as his sole outpatient oral hypoglycemic agent.  Not on exogenous insulin at home.  Plan: Hold home glipizide.  As the patient is currently n.p.o., I have ordered Accu-Cheks every 6 hours while awake with low-dose sliding scale insulin.     DVT prophylaxis: scd's  Code Status: Full code Family Communication: none Disposition Plan: Per Rounding Team Consults called: none  Admission status: Observation; PCU.    PLEASE NOTE THAT DRAGON DICTATION SOFTWARE WAS USED IN THE CONSTRUCTION OF THIS NOTE.   [place] [facility] Pager [phone] From [place]  Otherwise, please contact night-coverage  [email or web address] Password [facility]  12/11/2019, 8:51 PM

## 2019-12-11 NOTE — Addendum Note (Signed)
Addended by: Josiah Lobo on: 12/11/2019 02:40 PM   Modules accepted: Orders

## 2019-12-11 NOTE — ED Provider Notes (Addendum)
Patient care assumed at signout at 1700. Patient with history of profound ortho stasis in the emergency department following hypotensive episode with hypoxia. On ED arrival patient without any recurrent hypotension or hypoxia. PE study pending.  CTA is negative for PE. CT does demonstrate possible left lower lobe pneumonia versus aspiration. Patient is completely asymptomatic at this time. Discussed with patient findings of studies. Given my leukocytosis, transient hypoxia will treat with antibiotics for possible pneumonia. Discussed importance of return precautions if he has any worsening or new concerning symptoms.   Quintella Reichert, MD 12/11/19 1752  While awaiting transportation to his facility patient developed episode of emesis. Emesis bag with dark material with possible small clots mixed in. Sample was sent down to the lab and it is positive for blood. Patient denies any history of upper G.I. bleed, he is not anticoagulated. Hemoglobin is stable. Given new onset of small volume hematemesis and his episode of hypotension earlier feel it would be more appropriate for him to be observed in the hospital. Will treat with one-time dose of Protonix. Hospitalist consulted for admission.    Quintella Reichert, MD 12/11/19 2102

## 2019-12-11 NOTE — Addendum Note (Signed)
Addended by: Margaret Pyle on: 12/11/2019 02:06 PM   Modules accepted: Orders

## 2019-12-12 ENCOUNTER — Encounter (HOSPITAL_COMMUNITY): Payer: Self-pay | Admitting: Internal Medicine

## 2019-12-12 DIAGNOSIS — K922 Gastrointestinal hemorrhage, unspecified: Secondary | ICD-10-CM | POA: Diagnosis not present

## 2019-12-12 DIAGNOSIS — R112 Nausea with vomiting, unspecified: Secondary | ICD-10-CM | POA: Diagnosis present

## 2019-12-12 DIAGNOSIS — D72829 Elevated white blood cell count, unspecified: Secondary | ICD-10-CM | POA: Diagnosis present

## 2019-12-12 LAB — PREPARE RBC (CROSSMATCH)

## 2019-12-12 LAB — CBC WITH DIFFERENTIAL/PLATELET
Abs Immature Granulocytes: 0.08 10*3/uL — ABNORMAL HIGH (ref 0.00–0.07)
Basophils Absolute: 0 10*3/uL (ref 0.0–0.1)
Basophils Relative: 0 %
Eosinophils Absolute: 0 10*3/uL (ref 0.0–0.5)
Eosinophils Relative: 0 %
HCT: 29.9 % — ABNORMAL LOW (ref 39.0–52.0)
Hemoglobin: 9.1 g/dL — ABNORMAL LOW (ref 13.0–17.0)
Immature Granulocytes: 1 %
Lymphocytes Relative: 6 %
Lymphs Abs: 0.8 10*3/uL (ref 0.7–4.0)
MCH: 26.6 pg (ref 26.0–34.0)
MCHC: 30.4 g/dL (ref 30.0–36.0)
MCV: 87.4 fL (ref 80.0–100.0)
Monocytes Absolute: 0.2 10*3/uL (ref 0.1–1.0)
Monocytes Relative: 1 %
Neutro Abs: 12.1 10*3/uL — ABNORMAL HIGH (ref 1.7–7.7)
Neutrophils Relative %: 92 %
Platelets: 259 10*3/uL (ref 150–400)
RBC: 3.42 MIL/uL — ABNORMAL LOW (ref 4.22–5.81)
RDW: 15.5 % (ref 11.5–15.5)
WBC: 13.1 10*3/uL — ABNORMAL HIGH (ref 4.0–10.5)
nRBC: 0 % (ref 0.0–0.2)

## 2019-12-12 LAB — COMPREHENSIVE METABOLIC PANEL
ALT: 19 U/L (ref 0–44)
AST: 22 U/L (ref 15–41)
Albumin: 2.9 g/dL — ABNORMAL LOW (ref 3.5–5.0)
Alkaline Phosphatase: 117 U/L (ref 38–126)
Anion gap: 11 (ref 5–15)
BUN: 30 mg/dL — ABNORMAL HIGH (ref 8–23)
CO2: 25 mmol/L (ref 22–32)
Calcium: 8.5 mg/dL — ABNORMAL LOW (ref 8.9–10.3)
Chloride: 99 mmol/L (ref 98–111)
Creatinine, Ser: 1.08 mg/dL (ref 0.61–1.24)
GFR, Estimated: >60 mL/min (ref >60)
Glucose, Bld: 245 mg/dL — ABNORMAL HIGH (ref 70–99)
Potassium: 4.2 mmol/L (ref 3.5–5.1)
Sodium: 135 mmol/L (ref 135–145)
Total Bilirubin: 0.7 mg/dL (ref 0.3–1.2)
Total Protein: 6.7 g/dL (ref 6.5–8.1)

## 2019-12-12 LAB — CBG MONITORING, ED
Glucose-Capillary: 239 mg/dL — ABNORMAL HIGH (ref 70–99)
Glucose-Capillary: 219 mg/dL — ABNORMAL HIGH (ref 70–99)
Glucose-Capillary: 203 mg/dL — ABNORMAL HIGH (ref 70–99)
Glucose-Capillary: 190 mg/dL — ABNORMAL HIGH (ref 70–99)
Glucose-Capillary: 191 mg/dL — ABNORMAL HIGH (ref 70–99)

## 2019-12-12 LAB — PROCALCITONIN: Procalcitonin: 0.3 ng/mL

## 2019-12-12 LAB — IRON AND TIBC
Iron: 101 ug/dL (ref 45–182)
Saturation Ratios: 44 % — ABNORMAL HIGH (ref 17.9–39.5)
TIBC: 232 ug/dL — ABNORMAL LOW (ref 250–450)
UIBC: 131 ug/dL

## 2019-12-12 LAB — MAGNESIUM: Magnesium: 1.6 mg/dL — ABNORMAL LOW (ref 1.7–2.4)

## 2019-12-12 LAB — PHOSPHORUS: Phosphorus: 3 mg/dL (ref 2.5–4.6)

## 2019-12-12 LAB — FERRITIN: Ferritin: 228 ng/mL (ref 24–336)

## 2019-12-12 LAB — PROTIME-INR
INR: 1.1 (ref 0.8–1.2)
Prothrombin Time: 13.7 seconds (ref 11.4–15.2)

## 2019-12-12 MED ORDER — LEVALBUTEROL TARTRATE 45 MCG/ACT IN AERO: 2.0000 | INHALATION_SPRAY | Freq: Four times a day (QID) | RESPIRATORY_TRACT | Status: DC | PRN

## 2019-12-12 MED ORDER — DEXAMETHASONE SODIUM PHOSPHATE 4 MG/ML IJ SOLN
4.0000 mg | Freq: Once | INTRAMUSCULAR | Status: AC
  Administered 2019-12-12: 4 mg via INTRAVENOUS
  Filled 2019-12-12: qty 1

## 2019-12-12 MED ORDER — INSULIN ASPART 100 UNIT/ML ~~LOC~~ SOLN
0.0000 [IU] | Freq: Four times a day (QID) | SUBCUTANEOUS | Status: DC
  Administered 2019-12-12: 1 [IU] via SUBCUTANEOUS
  Administered 2019-12-12 (×2): 2 [IU] via SUBCUTANEOUS
  Filled 2019-12-12: qty 0.06

## 2019-12-12 MED ORDER — ALBUTEROL SULFATE HFA 108 (90 BASE) MCG/ACT IN AERS: 2.0000 | INHALATION_SPRAY | Freq: Four times a day (QID) | RESPIRATORY_TRACT | Status: DC | PRN

## 2019-12-12 MED ORDER — HYOSCYAMINE SULFATE 0.125 MG PO TABS
0.1250 mg | ORAL_TABLET | ORAL | Status: DC | PRN
  Filled 2019-12-12: qty 1

## 2019-12-12 MED ORDER — MAGNESIUM SULFATE 2 GM/50ML IV SOLN
2.0000 g | Freq: Once | INTRAVENOUS | Status: AC
  Administered 2019-12-12: 2 g via INTRAVENOUS
  Filled 2019-12-12: qty 50

## 2019-12-12 MED ORDER — MIDODRINE HCL 5 MG PO TABS
10.0000 mg | ORAL_TABLET | Freq: Three times a day (TID) | ORAL | Status: DC
  Administered 2019-12-12 – 2019-12-13 (×4): 10 mg via ORAL
  Filled 2019-12-12 (×7): qty 2

## 2019-12-12 MED ORDER — PANTOPRAZOLE SODIUM 40 MG IV SOLR
40.0000 mg | Freq: Two times a day (BID) | INTRAVENOUS | Status: DC
  Administered 2019-12-12 – 2019-12-13 (×3): 40 mg via INTRAVENOUS
  Filled 2019-12-12 (×3): qty 40

## 2019-12-12 MED ORDER — DIGOXIN 125 MCG PO TABS
0.2500 mg | ORAL_TABLET | Freq: Every day | ORAL | Status: DC
  Administered 2019-12-12 – 2019-12-13 (×2): 0.25 mg via ORAL
  Filled 2019-12-12: qty 1
  Filled 2019-12-12: qty 2

## 2019-12-12 MED ORDER — ADULT MULTIVITAMIN W/MINERALS CH
1.0000 | ORAL_TABLET | Freq: Every day | ORAL | Status: DC
  Administered 2019-12-12 – 2019-12-13 (×2): 1 via ORAL
  Filled 2019-12-12 (×2): qty 1

## 2019-12-12 MED ORDER — SODIUM CHLORIDE 0.9 % IV SOLN: INTRAVENOUS | Status: DC

## 2019-12-12 MED ORDER — ACETAMINOPHEN 325 MG PO TABS: 650.0000 mg | ORAL_TABLET | Freq: Four times a day (QID) | ORAL | Status: DC | PRN

## 2019-12-12 MED ORDER — VITAMIN B-12 1000 MCG PO TABS
500.0000 ug | ORAL_TABLET | Freq: Every day | ORAL | Status: DC
  Administered 2019-12-12 – 2019-12-13 (×2): 500 ug via ORAL
  Filled 2019-12-12 (×2): qty 1

## 2019-12-12 MED ORDER — ACETAMINOPHEN 650 MG RE SUPP: 650.0000 mg | Freq: Four times a day (QID) | RECTAL | Status: DC | PRN

## 2019-12-12 NOTE — ED Notes (Signed)
ED TO INPATIENT HANDOFF REPORT  Name/Age/Gender Clarence Andrews. 69 y.o. male  Code Status    Code Status Orders  (From admission, onward)         Start     Ordered   12/12/19 0038  Full code  Continuous        12/12/19 0038        Code Status History    Date Active Date Inactive Code Status Order ID Comments User Context   09/10/2019 1739 09/22/2019 0156 Full Code [id number]  Bonnielee Haff, MD ED   07/25/2019 1531 08/03/2019 0328 Full Code [id number]  Desiree Hane, MD ED   07/09/2019 1839 07/24/2019 2007 Full Code [id number]  Damita Lack, MD ED   04/28/2019 0736 05/04/2019 2225 Full Code [id number]  Kayleen Memos, DO ED   Advance Care Planning Activity      Home/SNF/Other Skilled nursing facility  Chief Complaint Acute upper GI bleed [K92.2]  Level of Care/Admitting Diagnosis ED Disposition    ED Disposition Condition Comment   [place]: [place] [[id number]] Level of Care: Progressive [102] Admit to Progressive based on following criteria: GI, ENDOCRINE disease patients with GI bleeding, acute liver failure or pancreatitis, stable with diabetic ketoaci dosis or thyrotoxicosis (hypothyroid) state. Covid Evaluation: Covid negative Diagnosis: Acute upper GI bleed [761607] Admitting Physician: Rhetta Mura [[id number]] Attending Physician: Rhetta Mura [[id number]]       Medical History Past Medical History:  Diagnosis Date  . CAD (coronary artery disease) 2017   CABG  . Cancer ([place])   . CHF (congestive heart failure) ([place])   . Colitis   . COVID-19 04/29/2019  . Diabetes ([place])   . Paronychia of second toe of right foot   . Throat cancer ([place])     Allergies No Known Allergies  IV Location/Drains/Wounds Patient Lines/Drains/Airways Status    Active Line/Drains/Airways    Name Placement date Placement time Site Days   Implanted Port 04/04/19 Right Chest 04/04/19  1441  Chest  252   External Urinary Catheter  09/11/19  --  --  92   GI Stent 22 Fr. 07/12/19  1645  --  153   Pressure Injury 07/25/19 Sacrum Stage 1 -  Intact skin with non-blanchable redness of a localized area usually over a bony prominence. 07/25/19  1800   140   Pressure Injury Ankle Right;Lateral Stage 2 -  Partial thickness loss of dermis presenting as a shallow open injury with a red, pink wound bed without slough. --  --      Wound / Incision (Open or Dehisced) 09/11/19 Non-pressure wound Ankle Left 09/11/19  0130  Ankle  92          Labs/Imaging Results for orders placed or performed during the hospital encounter of 12/11/19 (from the past 48 hour(s))  CBC with Differential     Status: Abnormal   Collection Time: 12/11/19  2:41 PM  Result Value Ref Range   WBC 11.2 (H) 4.0 - 10.5 K/uL   RBC 3.39 (L) 4.22 - 5.81 MIL/uL   Hemoglobin 9.1 (L) 13.0 - 17.0 g/dL   HCT 29.8 (L) 39 - 52 %   MCV 87.9 80.0 - 100.0 fL   MCH 26.8 26.0 - 34.0 pg   MCHC 30.5 30.0 - 36.0 g/dL   RDW 15.2 11.5 - 15.5 %   Platelets 301 150 - 400 K/uL   nRBC 0.0 0.0 - 0.2 %   Neutrophils  Relative % 90 %   Neutro Abs 10.0 (H) 1.7 - 7.7 K/uL   Lymphocytes Relative 8 %   Lymphs Abs 0.9 0.7 - 4.0 K/uL   Monocytes Relative 2 %   Monocytes Absolute 0.3 0.1 - 1.0 K/uL   Eosinophils Relative 0 %   Eosinophils Absolute 0.0 0.0 - 0.5 K/uL   Basophils Relative 0 %   Basophils Absolute 0.0 0.0 - 0.1 K/uL   Immature Granulocytes 0 %   Abs Immature Granulocytes 0.05 0.00 - 0.07 K/uL    Comment: Performed at [facility], [place] [address] [place], [place] [address]  Basic metabolic panel     Status: Abnormal   Collection Time: 12/11/19  2:41 PM  Result Value Ref Range   Sodium 138 135 - 145 mmol/L   Potassium 4.0 3.5 - 5.1 mmol/L   Chloride 100 98 - 111 mmol/L   CO2 26 22 - 32 mmol/L   Glucose, Bld 225 (H) 70 - 99 mg/dL    Comment: Glucose reference range applies only to samples taken after fasting for at least 8 hours.   BUN 25 (H) 8 -  23 mg/dL   Creatinine, Ser 1.10 0.61 - 1.24 mg/dL   Calcium 8.4 (L) 8.9 - 10.3 mg/dL   GFR, Estimated >60 >60 mL/min    Comment: (NOTE) Calculated using the CKD-EPI Creatinine Equation (2021)    Anion gap 12 5 - 15    Comment: Performed at [facility], [place] [address] [place], [place] [address]  Occult bld gastric/duodenum (cup to lab)     Status: Abnormal   Collection Time: 12/11/19  7:28 PM  Result Value Ref Range   pH, Gastric 6    Occult Blood, Gastric POSITIVE (A) NEGATIVE    Comment: Performed at [facility], [place] [address] [place], [place] [address]  Type and screen [place]     Status: None (Preliminary result)   Collection Time: 12/11/19  8:33 PM  Result Value Ref Range   ABO/RH(D) B POS    Antibody Screen NEG    Sample Expiration 12/14/2019,2359    Unit Number [id number]    Blood Component Type RED CELLS,LR    Unit division 00    Status of Unit ALLOCATED    Transfusion Status OK TO TRANSFUSE    Crossmatch Result      Compatible Performed at [facility] [address] [place],  [address]    Unit Number [id number]    Blood Component Type RED CELLS,LR    Unit division 00    Status of Unit ALLOCATED    Transfusion Status OK TO TRANSFUSE    Crossmatch Result Compatible   Respiratory Panel by RT PCR (Flu A&B, Covid) - Nasopharyngeal Swab     Status: None   Collection Time: 12/11/19  9:00 PM   Specimen: Nasopharyngeal Swab  Result Value Ref Range   SARS Coronavirus 2 by RT PCR NEGATIVE NEGATIVE    Comment: (NOTE) SARS-CoV-2 target nucleic acids are NOT DETECTED.  The SARS-CoV-2 RNA is generally detectable in upper respiratoy specimens during the acute phase of infection. The lowest concentration of SARS-CoV-2 viral copies this assay can detect is 131 copies/mL. A negative result does not preclude SARS-Cov-2 infection and should not be used as the sole basis for  treatment or other patient management decisions. A negative result may occur with  improper specimen collection/handling, submission of specimen other than nasopharyngeal swab, presence  of viral mutation(s) within the areas targeted by this assay, and inadequate number of viral copies (<131 copies/mL). A negative result must be combined with clinical observations, patient history, and epidemiological information. The expected result is Negative.  Fact Sheet for Patients:  PinkCheek.be  Fact Sheet for Healthcare Providers:  GravelBags.it  This test is no t yet approved or cleared by the [place] FDA and  has been authorized for detection and/or diagnosis of SARS-CoV-2 by FDA under an Emergency Use Authorization (EUA). This EUA will remain  in effect (meaning this test can be used) for the duration of the COVID-19 declaration under Section 564(b)(1) of the Act, 21 U.S.C. section 360bbb-3(b)(1), unless the authorization is terminated or revoked sooner.     Influenza A by PCR NEGATIVE NEGATIVE   Influenza B by PCR NEGATIVE NEGATIVE    Comment: (NOTE) The Xpert Xpress SARS-CoV-2/FLU/RSV assay is intended as an aid in  the diagnosis of influenza from Nasopharyngeal swab specimens and  should not be used as a sole basis for treatment. Nasal washings and  aspirates are unacceptable for Xpert Xpress SARS-CoV-2/FLU/RSV  testing.  Fact Sheet for Patients: PinkCheek.be  Fact Sheet for Healthcare Providers: GravelBags.it  This test is not yet approved or cleared by the [place] FDA and  has been authorized for detection and/or diagnosis of SARS-CoV-2 by  FDA under an Emergency Use Authorization (EUA). This EUA will remain  in effect (meaning this test can be used) for the duration of the  Covid-19 declaration under Section 564(b)(1) of the Act, 21  U.S.C. section  360bbb-3(b)(1), unless the authorization is  terminated or revoked. Performed at [facility], [place] [address] [place], [place] [address]   Prepare RBC (crossmatch)     Status: None   Collection Time: 12/12/19 12:47 AM  Result Value Ref Range   Order Confirmation      ORDER PROCESSED BY BLOOD BANK Performed at [facility], [place] [address], [place], [place] [address]   Protime-INR     Status: None   Collection Time: 12/12/19  6:14 AM  Result Value Ref Range   Prothrombin Time 13.7 11.4 - 15.2 seconds   INR 1.1 0.8 - 1.2    Comment: (NOTE) INR goal varies based on device and disease states. Performed at [facility], [place] [address], [place], [place] [address]   Comprehensive metabolic panel     Status: Abnormal   Collection Time: 12/12/19  6:14 AM  Result Value Ref Range   Sodium 135 135 - 145 mmol/L   Potassium 4.2 3.5 - 5.1 mmol/L   Chloride 99 98 - 111 mmol/L   CO2 25 22 - 32 mmol/L   Glucose, Bld 245 (H) 70 - 99 mg/dL    Comment: Glucose reference range applies only to samples taken after fasting for at least 8 hours.   BUN 30 (H) 8 - 23 mg/dL   Creatinine, Ser 1.08 0.61 - 1.24 mg/dL   Calcium 8.5 (L) 8.9 - 10.3 mg/dL   Total Protein 6.7 6.5 - 8.1 g/dL   Albumin 2.9 (L) 3.5 - 5.0 g/dL   AST 22 15 - 41 U/L   ALT 19 0 - 44 U/L   Alkaline Phosphatase 117 38 - 126 U/L   Total Bilirubin 0.7 0.3 - 1.2 mg/dL   GFR, Estimated >60 >60 mL/min    Comment: (NOTE) Calculated using the CKD-EPI Creatinine Equation (2021)    Anion gap 11 5 - 15  Comment: Performed at [facility], [place] [address], [place], [place] [address]  CBC with Differential/Platelet     Status: Abnormal   Collection Time: 12/12/19  6:14 AM  Result Value Ref Range   WBC 13.1 (H) 4.0 - 10.5 K/uL   RBC 3.42 (L) 4.22 - 5.81 MIL/uL   Hemoglobin 9.1 (L) 13.0 - 17.0 g/dL   HCT 29.9 (L) 39 - 52 %   MCV 87.4 80.0 - 100.0 fL   MCH 26.6  26.0 - 34.0 pg   MCHC 30.4 30.0 - 36.0 g/dL   RDW 15.5 11.5 - 15.5 %   Platelets 259 150 - 400 K/uL   nRBC 0.0 0.0 - 0.2 %   Neutrophils Relative % 92 %   Neutro Abs 12.1 (H) 1.7 - 7.7 K/uL   Lymphocytes Relative 6 %   Lymphs Abs 0.8 0.7 - 4.0 K/uL   Monocytes Relative 1 %   Monocytes Absolute 0.2 0.1 - 1.0 K/uL   Eosinophils Relative 0 %   Eosinophils Absolute 0.0 0.0 - 0.5 K/uL   Basophils Relative 0 %   Basophils Absolute 0.0 0.0 - 0.1 K/uL   Immature Granulocytes 1 %   Abs Immature Granulocytes 0.08 (H) 0.00 - 0.07 K/uL    Comment: Performed at [facility], [place] [address], [place], [place] [address]  Magnesium     Status: Abnormal   Collection Time: 12/12/19  6:14 AM  Result Value Ref Range   Magnesium 1.6 (L) 1.7 - 2.4 mg/dL    Comment: Performed at [facility], [place] [address] [place], [place] [address]  Phosphorus     Status: None   Collection Time: 12/12/19  6:14 AM  Result Value Ref Range   Phosphorus 3.0 2.5 - 4.6 mg/dL    Comment: Performed at [facility], [place] [address], [place], [place] [address]  CBG monitoring, ED     Status: Abnormal   Collection Time: 12/12/19  6:14 AM  Result Value Ref Range   Glucose-Capillary 239 (H) 70 - 99 mg/dL    Comment: Glucose reference range applies only to samples taken after fasting for at least 8 hours.   Comment 1 Notify RN   Procalcitonin     Status: None   Collection Time: 12/12/19  6:15 AM  Result Value Ref Range   Procalcitonin 0.30 ng/mL    Comment:        Interpretation: PCT (Procalcitonin) <= 0.5 ng/mL: Systemic infection (sepsis) is not likely. Local bacterial infection is possible. (NOTE)       Sepsis PCT Algorithm           Lower Respiratory Tract                                      Infection PCT Algorithm    ----------------------------     ----------------------------         PCT < 0.25 ng/mL                PCT < 0.10 ng/mL          Strongly  encourage             Strongly discourage   discontinuation of antibiotics    initiation of antibiotics    ----------------------------     -----------------------------       PCT 0.25 - 0.50 ng/mL  PCT 0.10 - 0.25 ng/mL               OR       >80% decrease in PCT            Discourage initiation of                                            antibiotics      Encourage discontinuation           of antibiotics    ----------------------------     -----------------------------         PCT >= 0.50 ng/mL              PCT 0.26 - 0.50 ng/mL               AND        <80% decrease in PCT             Encourage initiation of                                             antibiotics       Encourage continuation           of antibiotics    ----------------------------     -----------------------------        PCT >= 0.50 ng/mL                  PCT > 0.50 ng/mL               AND         increase in PCT                  Strongly encourage                                      initiation of antibiotics    Strongly encourage escalation           of antibiotics                                     -----------------------------                                           PCT <= 0.25 ng/mL                                                 OR                                        > 80% decrease in PCT  Discontinue / Do not initiate                                             antibiotics  Performed at [place] [address] [place], [place] [address]   Iron and TIBC     Status: Abnormal   Collection Time: 12/12/19  6:15 AM  Result Value Ref Range   Iron 101 45 - 182 ug/dL   TIBC 232 (L) 250 - 450 ug/dL   Saturation Ratios 44 (H) 17.9 - 39.5 %   UIBC 131 ug/dL    Comment: Performed at [facility], [place] [address] [place], [place] [address]  Ferritin     Status: None   Collection Time: 12/12/19  6:15 AM  Result  Value Ref Range   Ferritin 228 24 - 336 ng/mL    Comment: Performed at [facility], [place] [address] [place], [place] [address]  CBG monitoring, ED     Status: Abnormal   Collection Time: 12/12/19  8:33 AM  Result Value Ref Range   Glucose-Capillary 219 (H) 70 - 99 mg/dL    Comment: Glucose reference range applies only to samples taken after fasting for at least 8 hours.   Comment 1 Notify RN    Comment 2 Document in Chart   CBG monitoring, ED     Status: Abnormal   Collection Time: 12/12/19 12:26 PM  Result Value Ref Range   Glucose-Capillary 203 (H) 70 - 99 mg/dL    Comment: Glucose reference range applies only to samples taken after fasting for at least 8 hours.   Comment 1 Notify RN    Comment 2 Document in Chart   CBG monitoring, ED     Status: Abnormal   Collection Time: 12/12/19  6:13 PM  Result Value Ref Range   Glucose-Capillary 190 (H) 70 - 99 mg/dL    Comment: Glucose reference range applies only to samples taken after fasting for at least 8 hours.   Comment 1 Notify RN    Comment 2 Document in Chart   CBG monitoring, ED     Status: Abnormal   Collection Time: 12/12/19  8:16 PM  Result Value Ref Range   Glucose-Capillary 191 (H) 70 - 99 mg/dL    Comment: Glucose reference range applies only to samples taken after fasting for at least 8 hours.   CT Angio Chest PE W and/or Wo Contrast  Result Date: 12/11/2019 CLINICAL DATA:  5 77-year-old male with concern for pulmonary embolism. Colon cancer. EXAM: CT ANGIOGRAPHY CHEST WITH CONTRAST TECHNIQUE: Multidetector CT imaging of the chest was performed using the standard protocol during bolus administration of intravenous contrast. Multiplanar CT image reconstructions and MIPs were obtained to evaluate the vascular anatomy. CONTRAST:  71mL OMNIPAQUE IOHEXOL 350 MG/ML SOLN COMPARISON:  Chest CT dated 04/28/2019 and radiograph dated 09/10/2019 FINDINGS: Cardiovascular: There is no cardiomegaly or pericardial  effusion. There is coronary vascular calcification and postsurgical changes of CABG. The ascending aorta is enlarged measuring 4.5 cm in diameter similar to prior CT. Evaluation of the aorta is limited due to suboptimal opacification and timing of the contrast. No pulmonary artery embolus identified. Mediastinum/Nodes: No hilar or mediastinal adenopathy. The esophagus and the thyroid gland are grossly unremarkable. No mediastinal fluid collection. Right-sided Port-A-Cath with tip at the cavoatrial junction. Lungs/Pleura: Clusters of ground-glass  nodularity in the left lower lobe most concerning for pneumonia versus aspiration. Clinical correlation is recommended. There is a calcified granuloma in the lingula anteriorly. No lobar consolidation, pleural effusion, pneumothorax. The central airways are patent. Small amount of mucus content noted within the distal esophagus and left mainstem bronchus. Upper Abdomen: Multiple hepatic hypodense lesions most consistent with metastatic disease. Multiple gallstones. Partially visualized right renal hypodense lesion, likely a cyst. Musculoskeletal: Osteopenia with degenerative changes of the spine. No acute osseous pathology. Median sternotomy wires. Review of the MIP images confirms the above findings. IMPRESSION: 1. No CT evidence of pulmonary artery embolus. 2. Clusters of ground-glass nodularity in the left lower lobe most concerning for pneumonia versus aspiration. Clinical correlation is recommended. 3. Multiple hepatic metastatic disease. 4. Cholelithiasis. 5. Aortic Atherosclerosis (ICD10-I70.0). Electronically Signed   By: Anner Crete M.D.   On: 12/11/2019 17:02    Pending Labs Unresulted Labs (From admission, onward)         None      Vitals/Pain Today's Vitals   12/12/19 2200 12/12/19 2230 12/12/19 2300 12/12/19 2315  BP: 131/78 138/76 (!) 156/88   Pulse: 73 74 74 73  Resp: 15 15 18 13   Temp:      TempSrc:      SpO2: 99% 99% 98% 99%  PainSc:         Isolation Precautions No active isolations  Medications Medications  sodium chloride (PF) 0.9 % injection (has no administration in time range)  ondansetron (ZOFRAN) injection 4 mg (4 mg Intravenous Refused 12/11/19 2130)  digoxin (LANOXIN) tablet 0.25 mg (0.25 mg Oral Given 12/12/19 0900)  midodrine (PROAMATINE) tablet 10 mg (10 mg Oral Given 12/12/19 1627)  vitamin B-12 (CYANOCOBALAMIN) tablet 500 mcg (500 mcg Oral Given 12/12/19 0901)  multivitamin with minerals tablet 1 tablet (1 tablet Oral Given 12/12/19 0900)  acetaminophen (TYLENOL) tablet 650 mg (has no administration in time range)    Or  acetaminophen (TYLENOL) suppository 650 mg (has no administration in time range)  albuterol (VENTOLIN HFA) 108 (90 Base) MCG/ACT inhaler 2 puff (has no administration in time range)  pantoprazole (PROTONIX) injection 40 mg (40 mg Intravenous Given 12/12/19 2239)  insulin aspart (novoLOG) injection 0-6 Units (1 Units Subcutaneous Given 12/12/19 2022)  hyoscyamine (LEVSIN) tablet 0.125 mg (has no administration in time range)  0.9 %  sodium chloride infusion ( Intravenous New Bag/Given 12/12/19 1148)  sodium chloride 0.9 % bolus 1,000 mL (0 mLs Intravenous Stopped 12/11/19 1550)  iohexol (OMNIPAQUE) 350 MG/ML injection 100 mL (47 mLs Intravenous Contrast Given 12/11/19 1615)  amoxicillin-clavulanate (AUGMENTIN) 875-125 MG per tablet 1 tablet (1 tablet Oral Given 12/11/19 1754)  ondansetron (ZOFRAN) injection 4 mg (4 mg Intravenous Given 12/11/19 1939)  pantoprazole (PROTONIX) injection 40 mg (40 mg Intravenous Given 12/11/19 2050)  dexamethasone (DECADRON) injection 4 mg (4 mg Intravenous Given 12/12/19 1033)  magnesium sulfate IVPB 2 g 50 mL (0 g Intravenous Stopped 12/12/19 1854)    Mobility walks with device

## 2019-12-12 NOTE — Progress Notes (Addendum)
HEMATOLOGY-ONCOLOGY PROGRESS NOTE  SUBJECTIVE: Clarence Andrews was sent to the emergency room from the cancer center due to hypotension.  The patient has a longstanding history of hypotension and has been maintained on midodrine.  Blood pressures this morning in the 517O to 160V systolic and 37T diastolic.  The patient had dark-colored emesis in the emergency room last night which was heme positive.  This morning, he reports abdominal cramping and nausea.  Has not vomited since last night.  He denies any other bleeding.  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[place])   04/11/2019 - 11/27/2019 Chemotherapy   The patient had palonosetron (ALOXI) injection 0.25 mg, 0.25 mg, Intravenous,  Once, 8 of 8 cycles Administration: 0.25 mg (04/11/2019), 0.25 mg (05/28/2019), 0.25 mg (06/11/2019), 0.25 mg (06/25/2019), 0.25 mg (07/31/2019), 0.25 mg (08/13/2019), 0.25 mg (08/27/2019) pegfilgrastim-cbqv (UDENYCA) injection 6 mg, 6 mg, Subcutaneous, Once, 5 of 5 cycles Administration: 6 mg (05/30/2019), 6 mg (06/13/2019), 6 mg (06/27/2019), 6 mg (08/15/2019), 6 mg (08/29/2019) fluorouracil (ADRUCIL) 4,650 mg in sodium chloride 0.9 % 1,000 mL chemo infusion, 2,000 mg/m2 = 4,650 mg (100 % of original dose 2,000 mg/m2), Intravenous, Once, 1 of 1 cycle Dose modification: 2,000 mg/m2 (original dose 2,000 mg/m2, Cycle 5, Reason: Provider Judgment) Administration: 4,650 mg (07/31/2019) leucovorin 972 mg in dextrose 5 % 250 mL infusion, 400 mg/m2 = 972 mg, Intravenous,  Once, 8 of 8 cycles Administration: 972 mg (04/11/2019), 972 mg (05/28/2019), 972 mg (06/11/2019), 972 mg (06/25/2019), 932 mg (07/31/2019), 932 mg (08/13/2019), 932 mg (08/27/2019) oxaliplatin (ELOXATIN) 200 mg in dextrose 5 % 500 mL chemo infusion, 83 mg/m2 = 205 mg, Intravenous,  Once, 8 of 8 cycles Dose modification: 65 mg/m2 (original dose 85 mg/m2, Cycle 6, Reason: Provider Judgment) Administration: 200 mg  (04/11/2019), 200 mg (05/28/2019), 200 mg (06/11/2019), 200 mg (06/25/2019), 150 mg (07/31/2019), 150 mg (08/13/2019), 150 mg (08/27/2019) fluorouracil (ADRUCIL) chemo injection 950 mg, 400 mg/m2 = 950 mg, Intravenous,  Once, 1 of 1 cycle Administration: 950 mg (04/11/2019) fluorouracil (ADRUCIL) 5,850 mg in sodium chloride 0.9 % 133 mL chemo infusion, 2,400 mg/m2 = 5,850 mg, Intravenous, 1 Day/Dose, 12 of 13 cycles Dose modification: 2,000 mg/m2 (original dose 2,400 mg/m2, Cycle 2, Reason: Provider Judgment), 1,600 mg/m2 (original dose 2,400 mg/m2, Cycle 8, Reason: Provider Judgment) Administration: 5,850 mg (04/11/2019), 4,850 mg (05/28/2019), 4,850 mg (06/11/2019), 4,850 mg (06/25/2019), 4,650 mg (08/13/2019), 4,650 mg (08/27/2019), 3,300 mg (09/26/2019), 3,300 mg (10/14/2019), 3,300 mg (10/28/2019), 3,300 mg (11/11/2019), 3,300 mg (11/25/2019)  for chemotherapy treatment.    12/09/2019 -  Chemotherapy   The patient had palonosetron (ALOXI) injection 0.25 mg, 0.25 mg, Intravenous,  Once, 1 of 4 cycles Administration: 0.25 mg (12/09/2019) irinotecan (CAMPTOSAR) 260 mg in sodium chloride 0.9 % 500 mL chemo infusion, 125 mg/m2 = 260 mg (100 % of original dose 125 mg/m2), Intravenous,  Once, 1 of 4 cycles Dose modification: 125 mg/m2 (original dose 125 mg/m2, Cycle 1, Reason: Provider Judgment) Administration: 260 mg (12/09/2019) fluorouracil (ADRUCIL) 3,400 mg in sodium chloride 0.9 % 82 mL chemo infusion, 1,600 mg/m2 = 3,400 mg (100 % of original dose 1,600 mg/m2), Intravenous, 1 Day/Dose, 1 of 4 cycles Dose modification: 1,600 mg/m2 (original dose 1,600 mg/m2, Cycle 1, Reason: Provider Judgment) Administration: 3,400 mg (12/09/2019) bevacizumab-awwb (MVASI) 400 mg in sodium chloride 0.9 % 100 mL chemo infusion, 5 mg/kg = 400 mg (100 % of original dose 5 mg/kg), Intravenous,  Once,  1 of 4 cycles Dose modification: 5 mg/kg (original dose 5 mg/kg, Cycle 1) Administration: 400 mg (12/09/2019) bevacizumab-bvzr (ZIRABEV)  400 mg in sodium chloride 0.9 % 100 mL chemo infusion, 5 mg/kg = 400 mg, Intravenous,  Once, 1 of 1 cycle leucovorin 422 mg in sodium chloride 0.9 % 250 mL infusion, 200 mg/m2 = 422 mg (100 % of original dose 200 mg/m2), Intravenous,  Once, 1 of 4 cycles Dose modification: 200 mg/m2 (original dose 200 mg/m2, Cycle 1, Reason: Provider Judgment) Administration: 422 mg (12/09/2019)  for chemotherapy treatment.    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[place])   04/11/2019 - 11/27/2019 Chemotherapy   The patient had palonosetron (ALOXI) injection 0.25 mg, 0.25 mg, Intravenous,  Once, 8 of 8 cycles Administration: 0.25 mg (04/11/2019), 0.25 mg (05/28/2019), 0.25 mg (06/11/2019), 0.25 mg (06/25/2019), 0.25 mg (07/31/2019), 0.25 mg (08/13/2019), 0.25 mg (08/27/2019) pegfilgrastim-cbqv (UDENYCA) injection 6 mg, 6 mg, Subcutaneous, Once, 5 of 5 cycles Administration: 6 mg (05/30/2019), 6 mg (06/13/2019), 6 mg (06/27/2019), 6 mg (08/15/2019), 6 mg (08/29/2019) fluorouracil (ADRUCIL) 4,650 mg in sodium chloride 0.9 % 1,000 mL chemo infusion, 2,000 mg/m2 = 4,650 mg (100 % of original dose 2,000 mg/m2), Intravenous, Once, 1 of 1 cycle Dose modification: 2,000 mg/m2 (original dose 2,000 mg/m2, Cycle 5, Reason: Provider Judgment) Administration: 4,650 mg (07/31/2019) leucovorin 972 mg in dextrose 5 % 250 mL infusion, 400 mg/m2 = 972 mg, Intravenous,  Once, 8 of 8 cycles Administration: 972 mg (04/11/2019), 972 mg (05/28/2019), 972 mg (06/11/2019), 972 mg (06/25/2019), 932 mg (07/31/2019), 932 mg (08/13/2019), 932 mg (08/27/2019) oxaliplatin (ELOXATIN) 200 mg in dextrose 5 % 500 mL chemo infusion, 83 mg/m2 = 205 mg, Intravenous,  Once, 8 of 8 cycles Dose modification: 65 mg/m2 (original dose 85 mg/m2, Cycle 6, Reason: Provider Judgment) Administration: 200 mg (04/11/2019), 200 mg (05/28/2019), 200 mg (06/11/2019), 200 mg (06/25/2019), 150 mg (07/31/2019), 150 mg  (08/13/2019), 150 mg (08/27/2019) fluorouracil (ADRUCIL) chemo injection 950 mg, 400 mg/m2 = 950 mg, Intravenous,  Once, 1 of 1 cycle Administration: 950 mg (04/11/2019) fluorouracil (ADRUCIL) 5,850 mg in sodium chloride 0.9 % 133 mL chemo infusion, 2,400 mg/m2 = 5,850 mg, Intravenous, 1 Day/Dose, 12 of 13 cycles Dose modification: 2,000 mg/m2 (original dose 2,400 mg/m2, Cycle 2, Reason: Provider Judgment), 1,600 mg/m2 (original dose 2,400 mg/m2, Cycle 8, Reason: Provider Judgment) Administration: 5,850 mg (04/11/2019), 4,850 mg (05/28/2019), 4,850 mg (06/11/2019), 4,850 mg (06/25/2019), 4,650 mg (08/13/2019), 4,650 mg (08/27/2019), 3,300 mg (09/26/2019), 3,300 mg (10/14/2019), 3,300 mg (10/28/2019), 3,300 mg (11/11/2019), 3,300 mg (11/25/2019)  for chemotherapy treatment.    12/09/2019 -  Chemotherapy   The patient had palonosetron (ALOXI) injection 0.25 mg, 0.25 mg, Intravenous,  Once, 1 of 4 cycles Administration: 0.25 mg (12/09/2019) irinotecan (CAMPTOSAR) 260 mg in sodium chloride 0.9 % 500 mL chemo infusion, 125 mg/m2 = 260 mg (100 % of original dose 125 mg/m2), Intravenous,  Once, 1 of 4 cycles Dose modification: 125 mg/m2 (original dose 125 mg/m2, Cycle 1, Reason: Provider Judgment) Administration: 260 mg (12/09/2019) fluorouracil (ADRUCIL) 3,400 mg in sodium chloride 0.9 % 82 mL chemo infusion, 1,600 mg/m2 = 3,400 mg (100 % of original dose 1,600 mg/m2), Intravenous, 1 Day/Dose, 1 of 4 cycles Dose modification: 1,600 mg/m2 (original dose 1,600 mg/m2, Cycle 1, Reason: Provider Judgment) Administration: 3,400 mg (12/09/2019) bevacizumab-awwb (MVASI) 400 mg in sodium chloride 0.9 % 100 mL chemo infusion, 5 mg/kg = 400 mg (100 %  of original dose 5 mg/kg), Intravenous,  Once, 1 of 4 cycles Dose modification: 5 mg/kg (original dose 5 mg/kg, Cycle 1) Administration: 400 mg (12/09/2019) bevacizumab-bvzr (ZIRABEV) 400 mg in sodium chloride 0.9 % 100 mL chemo infusion, 5 mg/kg = 400 mg, Intravenous,  Once, 1 of 1  cycle leucovorin 422 mg in sodium chloride 0.9 % 250 mL infusion, 200 mg/m2 = 422 mg (100 % of original dose 200 mg/m2), Intravenous,  Once, 1 of 4 cycles Dose modification: 200 mg/m2 (original dose 200 mg/m2, Cycle 1, Reason: Provider Judgment) Administration: 422 mg (12/09/2019)  for chemotherapy treatment.    Malignant neoplasm of colon ([place])  05/01/2019 Initial Diagnosis   Malignant neoplasm of colon ([place])   12/09/2019 -  Chemotherapy   The patient had palonosetron (ALOXI) injection 0.25 mg, 0.25 mg, Intravenous,  Once, 1 of 4 cycles Administration: 0.25 mg (12/09/2019) irinotecan (CAMPTOSAR) 260 mg in sodium chloride 0.9 % 500 mL chemo infusion, 125 mg/m2 = 260 mg (100 % of original dose 125 mg/m2), Intravenous,  Once, 1 of 4 cycles Dose modification: 125 mg/m2 (original dose 125 mg/m2, Cycle 1, Reason: Provider Judgment) Administration: 260 mg (12/09/2019) fluorouracil (ADRUCIL) 3,400 mg in sodium chloride 0.9 % 82 mL chemo infusion, 1,600 mg/m2 = 3,400 mg (100 % of original dose 1,600 mg/m2), Intravenous, 1 Day/Dose, 1 of 4 cycles Dose modification: 1,600 mg/m2 (original dose 1,600 mg/m2, Cycle 1, Reason: Provider Judgment) Administration: 3,400 mg (12/09/2019) bevacizumab-awwb (MVASI) 400 mg in sodium chloride 0.9 % 100 mL chemo infusion, 5 mg/kg = 400 mg (100 % of original dose 5 mg/kg), Intravenous,  Once, 1 of 4 cycles Dose modification: 5 mg/kg (original dose 5 mg/kg, Cycle 1) Administration: 400 mg (12/09/2019) bevacizumab-bvzr (ZIRABEV) 400 mg in sodium chloride 0.9 % 100 mL chemo infusion, 5 mg/kg = 400 mg, Intravenous,  Once, 1 of 1 cycle leucovorin 422 mg in sodium chloride 0.9 % 250 mL infusion, 200 mg/m2 = 422 mg (100 % of original dose 200 mg/m2), Intravenous,  Once, 1 of 4 cycles Dose modification: 200 mg/m2 (original dose 200 mg/m2, Cycle 1, Reason: Provider Judgment) Administration: 422 mg (12/09/2019)  for chemotherapy treatment.     PHYSICAL EXAMINATION:  Vitals:    12/12/19 0930 12/12/19 1000  BP: 140/88 139/85  Pulse: 94 94  Resp: 17 19  Temp:    SpO2: 100% 99%   There were no vitals filed for this visit.  Intake/Output from previous day: 11/10 0701 - 11/11 0700 In: -  Out: 100 [Emesis/NG output:100]  GENERAL: Chronically ill-appearing, no distress OROPHARYNX:no exudate, no erythema and lips, buccal mucosa, and tongue normal  LUNGS: clear to auscultation and percussion with normal breathing effort HEART: regular rate & rhythm and no murmurs and no lower extremity edema ABDOMEN: Positive bowel sounds, soft, mild tenderness in his upper abdomen NEURO: alert & oriented x 3 with fluent speech, no focal motor/sensory deficits  LABORATORY DATA:  I have reviewed the data as listed CMP Latest Ref Rng & Units 12/12/2019 12/11/2019 12/09/2019  Glucose 70 - 99 mg/dL 245(H) 225(H) 279(H)  BUN 8 - 23 mg/dL 30(H) 25(H) 21  Creatinine 0.61 - 1.24 mg/dL 1.08 1.10 1.14  Sodium 135 - 145 mmol/L 135 138 139  Potassium 3.5 - 5.1 mmol/L 4.2 4.0 4.1  Chloride 98 - 111 mmol/L 99 100 102  CO2 22 - 32 mmol/L 25 26 27   Calcium 8.9 - 10.3 mg/dL 8.5(L) 8.4(L) 8.9  Total Protein 6.5 - 8.1 g/dL 6.7 - 6.6  Total Bilirubin 0.3 - 1.2 mg/dL 0.7 - 0.3  Alkaline Phos 38 - 126 U/L 117 - 154(H)  AST 15 - 41 U/L 22 - 23  ALT 0 - 44 U/L 19 - 16    Lab Results  Component Value Date   WBC 13.1 (H) 12/12/2019   HGB 9.1 (L) 12/12/2019   HCT 29.9 (L) 12/12/2019   MCV 87.4 12/12/2019   PLT 259 12/12/2019   NEUTROABS 12.1 (H) 12/12/2019    CT Angio Chest PE W and/or Wo Contrast  Result Date: 12/11/2019 CLINICAL DATA:  [age]-year-old male with concern for pulmonary embolism. Colon cancer. EXAM: CT ANGIOGRAPHY CHEST WITH CONTRAST TECHNIQUE: Multidetector CT imaging of the chest was performed using the standard protocol during bolus administration of intravenous contrast. Multiplanar CT image reconstructions and MIPs were obtained to evaluate the vascular anatomy.  CONTRAST:  59m OMNIPAQUE IOHEXOL 350 MG/ML SOLN COMPARISON:  Chest CT dated 04/28/2019 and radiograph dated 09/10/2019 FINDINGS: Cardiovascular: There is no cardiomegaly or pericardial effusion. There is coronary vascular calcification and postsurgical changes of CABG. The ascending aorta is enlarged measuring 4.5 cm in diameter similar to prior CT. Evaluation of the aorta is limited due to suboptimal opacification and timing of the contrast. No pulmonary artery embolus identified. Mediastinum/Nodes: No hilar or mediastinal adenopathy. The esophagus and the thyroid gland are grossly unremarkable. No mediastinal fluid collection. Right-sided Port-A-Cath with tip at the cavoatrial junction. Lungs/Pleura: Clusters of ground-glass nodularity in the left lower lobe most concerning for pneumonia versus aspiration. Clinical correlation is recommended. There is a calcified granuloma in the lingula anteriorly. No lobar consolidation, pleural effusion, pneumothorax. The central airways are patent. Small amount of mucus content noted within the distal esophagus and left mainstem bronchus. Upper Abdomen: Multiple hepatic hypodense lesions most consistent with metastatic disease. Multiple gallstones. Partially visualized right renal hypodense lesion, likely a cyst. Musculoskeletal: Osteopenia with degenerative changes of the spine. No acute osseous pathology. Median sternotomy wires. Review of the MIP images confirms the above findings. IMPRESSION: 1. No CT evidence of pulmonary artery embolus. 2. Clusters of ground-glass nodularity in the left lower lobe most concerning for pneumonia versus aspiration. Clinical correlation is recommended. 3. Multiple hepatic metastatic disease. 4. Cholelithiasis. 5. Aortic Atherosclerosis (ICD10-I70.0). Electronically Signed   By: AAnner CreteM.D.   On: 12/11/2019 17:02   CT Abdomen Pelvis W Contrast  Result Date: 12/06/2019 CLINICAL DATA:  69year old male with history of colon  cancer. Restaging examination. Ongoing chemotherapy. EXAM: CT ABDOMEN AND PELVIS WITH CONTRAST TECHNIQUE: Multidetector CT imaging of the abdomen and pelvis was performed using the standard protocol following bolus administration of intravenous contrast. CONTRAST:  1090mOMNIPAQUE IOHEXOL 300 MG/ML  SOLN COMPARISON:  CT the abdomen and pelvis 09/11/2019. FINDINGS: Lower chest: Left atrial appendage ligation clip. Atherosclerotic calcifications in left anterior descending, left circumflex and right coronary arteries. Postoperative changes of median sternotomy for CABG, including LIMA to the LAD. Calcified granulomas in the anterior aspect of the left upper lobe. Hepatobiliary: Multiple hypovascular hepatic lesions appear increased in number and size compared to the prior examination. The largest of these lesions is in the central aspect of the liver predominantly in segment 4A (axial image 21 of series 2), currently measuring 6.6 x 5.5 cm (previously 5.7 x 3.9 cm). Another prominent lesion in the inferior aspect of the right lobe of the liver between segments 5 and 6 (axial image 33 of series 2), currently 5.4 x 4.0 cm (previously 3.5 x 3.3 cm. Several small  new lesions are also noted in segment 2 (axial image 22 of series 2), central aspect of the left lobe of the liver between segments 2 and 3 (axial image 23 of series 2), segment 8 (axial image 16 of series 2), and segment 7 (axial image 24 of series 2). No intra or extrahepatic biliary ductal dilatation. Tiny calcified gallstones in the neck of the gallbladder. No findings to suggest an acute cholecystitis at this time. Pancreas: 1.4 x 1.0 cm low-attenuation lesion in the head of the pancreas (image 35 of series 2), stable compared to the prior study, too small to characterize. No other pancreatic mass. No pancreatic ductal dilatation. No peripancreatic fluid collections or inflammatory changes. Spleen: Unremarkable. Adrenals/Urinary Tract: Multiple  low-attenuation lesions in both kidneys, compatible with simple cysts, largest of which is exophytic in the posterior aspect of the upper pole the right kidney measuring 5.5 x 4.1 cm. Other subcentimeter low-attenuation lesions in both kidneys are too small to definitively characterize, but statistically likely to represent tiny cysts. Bilateral adrenal glands are normal in appearance. No hydroureteronephrosis. Asymmetric mural thickening of the urinary bladder wall, most severe posteriorly where there also appears to be some increased urothelial enhancement, measuring up to 1.4 cm in thickness (sagittal image 70 of series 6). Stomach/Bowel: The appearance of the stomach is normal. No pathologic dilatation of small bowel or colon. Stent traversing an area of stenosis in the distal sigmoid colon. The appendix is not confidently identified and may be surgically absent. Regardless, there are no inflammatory changes noted adjacent to the cecum to suggest the presence of an acute appendicitis at this time. Vascular/Lymphatic: Mild atherosclerotic disease in the abdominal aorta. No aneurysm or dissection noted in the abdominal or pelvic vasculature. Borderline enlarged peripancreatic lymph node (axial image 34 of series 2), stable compared to prior examination. No other definite pathologically enlarged lymph nodes are noted in the abdomen or pelvis. Reproductive: Prostate gland and seminal vesicles are unremarkable in appearance. Other: No significant volume of ascites.  No pneumoperitoneum. Musculoskeletal: There are no aggressive appearing lytic or blastic lesions noted in the visualized portions of the skeleton. IMPRESSION: 1. Progressive metastatic disease to the liver with increased number and size of numerous hypovascular lesions, as detailed above. 2. No other sites of extra hepatic metastatic disease confidently identified in the abdomen or pelvis. 3. Profound mural thickening in the posterior aspect of the  urinary bladder with some urothelial hyperenhancement, increased compared to the prior examination. This may reflect post treatment changes from prior radiation therapy, however, with urologic consultation should be considered for further clinical evaluation. 4. Small cystic lesion in the head of the pancreas, nonspecific, but stable compared to the prior study and favored to represent a small pseudocyst or other benign lesion. Attention at time of routine follow-up imaging is recommended. 5. Stable position of stent in the sigmoid colon, without evidence of bowel obstruction. 6. Aortic atherosclerosis. 7. Cholelithiasis without evidence of acute cholecystitis at this time. 8. Additional incidental findings, as above. Electronically Signed   By: Vinnie Langton M.D.   On: 12/06/2019 09:10    ASSESSMENT AND PLAN: 1. 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 Colonoscopy 03/07/2019-nearly completely obstructing mass beginning at 16 cm from the anal verge, sigmoid colon polyp ? CTs 03/15/2019-wall thickening of the lower sigmoid colon, low rectal mass with significant luminal narrowing, diffuse liver metastases, borderline enlarged sigmoid mesocolon, iliac, and upper abdominal lymph nodes ? Elevated CEA ? Biopsy liver lesion  03/29/2019-metastatic adenocarcinoma to liver consistent with clinical impression of colorectal primary.MSS, tumor mutation burden-3, K-ras G12D, PIK3CA mutations ? Cycle 1 FOLFOX 04/11/2019 ? Cycle 2 FOLFOX 05/28/2019 ? Cycle 3 FOLFOX 06/11/2019 ? Cycle 4 FOLFOX 06/25/2019 ? CT abdomen/pelvis 07/10/2019-severe obstructing stricture at the sigmoid colon, stable to slight improvement in hepatic metastatic disease compared to February 2021 ? Cycle 5 FOLFOX 07/31/2019 ? Cycle6FOLFOX 08/13/2019 ? Cycle 7 FOLFOX 08/27/2019 ? CT abdomen/pelvis 09/12/2019-stable and slightly decreased hepatic lesions, no evidence of progressive metastatic disease, slight  enlargement of a previously noted pancreas head/uncinate lesion, no evidence of colonic obstruction, stable small mesenteric and retroperitoneal nodes ? Cycle8FOLFOX 09/26/2019, oxaliplatin held secondary to persistent orthostatic hypotension ? Cycle 9 FOLFOX 10/14/2019, oxaliplatin on hold secondary to persistent orthostatic hypotension ? Cycle 10 FOLFOX 10/28/2019, oxaliplatin on hold secondary to persistent orthostatic hypotension ? Cycle 11 FOLFOX 11/11/2019, oxaliplatin on hold secondary to persistent orthostatic hypotension ? Cycle 12 FOLFOX 11/25/2019, oxaliplatin on hold secondary to persistent orthostatic hypotension ? CT abdomen/pelvis 12/05/2019-increased size and number of liver metastases, mural thickening in the posterior bladder with enhancement, stable small cystic lesion in the pancreas head, stable sigmoid stent without evidence of obstruction 2. 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 Diabetes 4. Coronary artery disease, status post coronary artery bypass surgery in 2017 5. Neutropenia following cycle 1 FOLFOX 6. Admission 04/28/2019 with COVID-19 infection 7. C. difficile colitis 04/28/2019 8. Atrial flutter during hospital admission March/April 2021-treated with a Cardizem drip, digoxin, and Eliquis anticoagulation. Converted to metoprolol at discharge.  Admission with recurrent rapid atrial fibrillation/flutter 07/09/2019 9. 07/09/2019-hospital admission for bowel obstruction  Sigmoidoscopy 07/12/2019-completely obstructing mass at 17-20 cm proximal to the anus-oozing present, stent placed 10. Fall 07/24/2019 with acute kidney injury/rhabdomyolysis 11. 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  Florinef 0.1 mg daily beginning 11/11/2019. 13.Right foot drop is likely due to weight  loss/nerve compression 14. C. 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  Hospital admission 12/11/2019-hypotension and concern for GI bleed  Clarence Andrews has been admitted secondary to hypotension and concern for GI bleed.  Documented blood pressures show significant improvement.  He vomited last evening and it was heme positive.  His hemoglobin remained stable.  He has persistent nausea which is likely delayed nausea secondary to Irinotecan.  He also has abdominal cramping which is due to recent Irinotecan.  Recommendations: 1.  We will give him a one-time dose of dexamethasone 4 mg IV for delayed nausea and vomiting. 2.  We will trial him on Levsin 0.125 mg every 4 hours as needed for abdominal cramping. 3.  Continue midodrine for hypotension. 4.  Continue as needed antiemetics. 5.  Consider repeat cardiology evaluation for persistent orthostatic hypotension 6.  Follow-up at the Cancer center as scheduled    LOS: 0 days   Mikey Bussing, DNP, AGPCNP-BC, AOCNP 12/12/19 Clarence Andrews is well-known to me with a history of metastatic colon cancer.  He was recently confirmed to have disease progression following FOLFOX chemotherapy.  He completed cycle 1 FOLFIRI/bevacizumab on 12/09/2019.  He presented to the cancer center yesterday for the pump disconnect and was noted to have nausea/vomiting and hypotension.  He is referred to emergency room.  He last vomited early this morning.  He complains of nausea and "cramping "abdominal pain.  I suspect the nausea and abdominal pain are related to irinotecan.  The elevated white count  is likely related to Decadron he received on 12/09/2019.  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  He has been evaluated by cardiology in the past.  He may benefit for a repeat cardiology consultation for help with management of the orthostasis.

## 2019-12-12 NOTE — Progress Notes (Signed)
PROGRESS NOTE    Clarence Andrews.  JGG:[id number] DOB: 1950-11-11 DOA: 12/11/2019 PCP: Patient, No Pcp Per    Brief Narrative:  69 year old gentleman with history of metastatic colon cancer on chemotherapy, recently progressive disease, progressive debility and currently at a nursing home, paroxysmal A. fib, coronary artery disease status post CABG in 2017 and chronic anemia with baseline hemoglobin about 9, history of orthostatic hypotension on midodrine, type 2 diabetes who received his second line of chemotherapy on 11/8, presented to cancer center on 11/10 with cough and nausea as well as coffee-ground emesis.  He was found to have low blood pressure so sent to ER.  Patient himself has progressive loss of appetite, nausea, abdominal pain and cramps. At the emergency room, blood pressure stable on supine position.  On room air.  CT chest negative for PE.  Admitted for symptomatic treatment, failure to thrive and hypertension.  Assessment & Plan:   Principal Problem:   Acute upper GI bleed Active Problems:   Type 2 diabetes mellitus without complication (HCC)   Atrial flutter (HCC)   Orthostatic hypotension   Nausea & vomiting   Leukocytosis  Nausea/poor appetite/failure to thrive/coffee-ground emesis: Recent normal EGD.  Suspected due to chemotherapy.  Unable to tolerate diet. Start on IV fluids, will challenge with clears today and monitor response. Oncology recommended dexamethasone.  Also started on Levsin.  Suspected upper GI bleeding: Had one episode of emesis that looked like coffee-ground.  FOBT positive.  Hemoglobin at baseline.  Recent upper GI endoscopy was normal.  He is not on any PPI.  Started on PPI.  Currently no indication for endoscopic evaluation.  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  Extensive cardiac investigations were negative. Orthostatic precautions.  Continue midodrine. Patient is at  end-of-life with metastatic colon cancer, he is unlikely to benefit with any more invasive investigations.  Type 2 diabetes: On glipizide.  Unreliable oral intake.  Keep on insulin.  Paroxysmal A. fib: Currently sinus rhythm.  On digoxin.  Unable to anticoagulate.  Metastatic colon cancer with progressive disease/failure to thrive/advanced physical deconditioning: Patient is on chemotherapy.  Followed by his oncologist. Patient desires to continue chemotherapy and believes in miracles. We will continue supportive care.  Hopefully he is strong enough to go for another cycle of chemo. Will consult palliative care team for more education/counseling and support.   DVT prophylaxis: SCDs Start: 12/12/19 0038   Code Status: Full code Family Communication: None. Disposition Plan: Status is: Observation  The patient will require care spanning > 2 midnights and should be moved to inpatient because: IV treatments appropriate due to intensity of illness or inability to take PO and Inpatient level of care appropriate due to severity of illness  Dispo: The patient is from: SNF              Anticipated d/c is to: SNF              Anticipated d/c date is: 2 days              Patient currently is not medically stable to d/c.         Consultants:   Oncology  Procedures:   None  Antimicrobials:   None   Subjective: Patient seen and examined.  Remains in the ER room waiting for inpatient bed.  When I went to examine him he was n.p.o., he has not tried to eat anything.  Does not feel like eating.  Does  not have any appetite.  Passing flatus but no bowel movement yet. Feels very weak.  He is in nursing home for last 4 months. Belly hurts.  No more vomiting.  Objective: Vitals:   12/12/19 1000 12/12/19 1144 12/12/19 1315 12/12/19 1453  BP: 139/85 (!) 146/92 (!) 161/95 (!) 148/108  Pulse: 94 93 94 91  Resp: 19 14 18 16   Temp:      TempSrc:      SpO2: 99% 99% 99% 99%     Intake/Output Summary (Last 24 hours) at 12/12/2019 1542 Last data filed at 12/12/2019 0039 Gross per 24 hour  Intake --  Output 100 ml  Net -100 ml   There were no vitals filed for this visit.  Examination:  General exam: Appears chronically sick.  Anxious.  Debilitated and frail. Respiratory system: Clear to auscultation. Respiratory effort normal.  No added sounds. Port-A-Cath present on the right chest. Cardiovascular system: S1 & S2 heard, RRR.  Gastrointestinal system: Abdomen soft.  Mild tenderness all over with no rigidity or guarding. Central nervous system: Alert and oriented. No focal neurological deficits. Extremities: Symmetric 5 x 5 power. Skin: No rashes, lesions or ulcers Psychiatry: Judgement and insight appear normal. Mood & affect anxious and flat.    Data Reviewed: I have personally reviewed following labs and imaging studies  CBC: Recent Labs  Lab 12/09/19 0842 12/11/19 1441 12/12/19 0614  WBC 6.6 11.2* 13.1*  NEUTROABS 4.4 10.0* 12.1*  HGB 8.8* 9.1* 9.1*  HCT 29.1* 29.8* 29.9*  MCV 86.1 87.9 87.4  PLT 320 301 379   Basic Metabolic Panel: Recent Labs  Lab 12/09/19 0842 12/11/19 1441 12/12/19 0614  NA 139 138 135  K 4.1 4.0 4.2  CL 102 100 99  CO2 27 26 25   GLUCOSE 279* 225* 245*  BUN 21 25* 30*  CREATININE 1.14 1.10 1.08  CALCIUM 8.9 8.4* 8.5*  MG 1.6*  --  1.6*  PHOS  --   --  3.0   GFR: Estimated Creatinine Clearance: 72.9 mL/min (by C-G formula based on SCr of 1.08 mg/dL). Liver Function Tests: Recent Labs  Lab 12/09/19 0842 12/12/19 0614  AST 23 22  ALT 16 19  ALKPHOS 154* 117  BILITOT 0.3 0.7  PROT 6.6 6.7  ALBUMIN 2.7* 2.9*   No results for input(s): LIPASE, AMYLASE in the last 168 hours. No results for input(s): AMMONIA in the last 168 hours. Coagulation Profile: Recent Labs  Lab 12/12/19 0614  INR 1.1   Cardiac Enzymes: No results for input(s): CKTOTAL, CKMB, CKMBINDEX, TROPONINI in the last 168  hours. BNP (last 3 results) No results for input(s): PROBNP in the last 8760 hours. HbA1C: No results for input(s): HGBA1C in the last 72 hours. CBG: Recent Labs  Lab 12/12/19 0614 12/12/19 0833 12/12/19 1226  GLUCAP 239* 219* 203*   Lipid Profile: No results for input(s): CHOL, HDL, LDLCALC, TRIG, CHOLHDL, LDLDIRECT in the last 72 hours. Thyroid Function Tests: No results for input(s): TSH, T4TOTAL, FREET4, T3FREE, THYROIDAB in the last 72 hours. Anemia Panel: Recent Labs    12/12/19 0615  FERRITIN 228  TIBC 232*  IRON 101   Sepsis Labs: Recent Labs  Lab 12/12/19 0615  PROCALCITON 0.30    Recent Results (from the past 240 hour(s))  Respiratory Panel by RT PCR (Flu A&B, Covid) - Nasopharyngeal Swab     Status: None   Collection Time: 12/11/19  9:00 PM   Specimen: Nasopharyngeal Swab  Result Value Ref Range Status  SARS Coronavirus 2 by RT PCR NEGATIVE NEGATIVE Final    Comment: (NOTE) SARS-CoV-2 target nucleic acids are NOT DETECTED.  The SARS-CoV-2 RNA is generally detectable in upper respiratoy specimens during the acute phase of infection. The lowest concentration of SARS-CoV-2 viral copies this assay can detect is 131 copies/mL. A negative result does not preclude SARS-Cov-2 infection and should not be used as the sole basis for treatment or other patient management decisions. A negative result may occur with  improper specimen collection/handling, submission of specimen other than nasopharyngeal swab, presence of viral mutation(s) within the areas targeted by this assay, and inadequate number of viral copies (<131 copies/mL). A negative result must be combined with clinical observations, patient history, and epidemiological information. The expected result is Negative.  Fact Sheet for Patients:  PinkCheek.be  Fact Sheet for Healthcare Providers:  GravelBags.it  This test is no t yet approved or  cleared by the [place] FDA and  has been authorized for detection and/or diagnosis of SARS-CoV-2 by FDA under an Emergency Use Authorization (EUA). This EUA will remain  in effect (meaning this test can be used) for the duration of the COVID-19 declaration under Section 564(b)(1) of the Act, 21 U.S.C. section 360bbb-3(b)(1), unless the authorization is terminated or revoked sooner.     Influenza A by PCR NEGATIVE NEGATIVE Final   Influenza B by PCR NEGATIVE NEGATIVE Final    Comment: (NOTE) The Xpert Xpress SARS-CoV-2/FLU/RSV assay is intended as an aid in  the diagnosis of influenza from Nasopharyngeal swab specimens and  should not be used as a sole basis for treatment. Nasal washings and  aspirates are unacceptable for Xpert Xpress SARS-CoV-2/FLU/RSV  testing.  Fact Sheet for Patients: PinkCheek.be  Fact Sheet for Healthcare Providers: GravelBags.it  This test is not yet approved or cleared by the [place] FDA and  has been authorized for detection and/or diagnosis of SARS-CoV-2 by  FDA under an Emergency Use Authorization (EUA). This EUA will remain  in effect (meaning this test can be used) for the duration of the  Covid-19 declaration under Section 564(b)(1) of the Act, 21  U.S.C. section 360bbb-3(b)(1), unless the authorization is  terminated or revoked. Performed at [facility] [place], [place] [address], [place], [place] [address]          Radiology Studies: CT Angio Chest PE W and/or Wo Contrast  Result Date: 12/11/2019 CLINICAL DATA:  [age]-year-old male with concern for pulmonary embolism. Colon cancer. EXAM: CT ANGIOGRAPHY CHEST WITH CONTRAST TECHNIQUE: Multidetector CT imaging of the chest was performed using the standard protocol during bolus administration of intravenous contrast. Multiplanar CT image reconstructions and MIPs were obtained to evaluate the vascular anatomy.  CONTRAST:  22mL OMNIPAQUE IOHEXOL 350 MG/ML SOLN COMPARISON:  Chest CT dated 04/28/2019 and radiograph dated 09/10/2019 FINDINGS: Cardiovascular: There is no cardiomegaly or pericardial effusion. There is coronary vascular calcification and postsurgical changes of CABG. The ascending aorta is enlarged measuring 4.5 cm in diameter similar to prior CT. Evaluation of the aorta is limited due to suboptimal opacification and timing of the contrast. No pulmonary artery embolus identified. Mediastinum/Nodes: No hilar or mediastinal adenopathy. The esophagus and the thyroid gland are grossly unremarkable. No mediastinal fluid collection. Right-sided Port-A-Cath with tip at the cavoatrial junction. Lungs/Pleura: Clusters of ground-glass nodularity in the left lower lobe most concerning for pneumonia versus aspiration. Clinical correlation is recommended. There is a calcified granuloma in the lingula anteriorly. No lobar consolidation, pleural effusion, pneumothorax. The central airways  are patent. Small amount of mucus content noted within the distal esophagus and left mainstem bronchus. Upper Abdomen: Multiple hepatic hypodense lesions most consistent with metastatic disease. Multiple gallstones. Partially visualized right renal hypodense lesion, likely a cyst. Musculoskeletal: Osteopenia with degenerative changes of the spine. No acute osseous pathology. Median sternotomy wires. Review of the MIP images confirms the above findings. IMPRESSION: 1. No CT evidence of pulmonary artery embolus. 2. Clusters of ground-glass nodularity in the left lower lobe most concerning for pneumonia versus aspiration. Clinical correlation is recommended. 3. Multiple hepatic metastatic disease. 4. Cholelithiasis. 5. Aortic Atherosclerosis (ICD10-I70.0). Electronically Signed   By: Anner Crete M.D.   On: 12/11/2019 17:02        Scheduled Meds: . digoxin  0.25 mg Oral Daily  . insulin aspart  0-6 Units Subcutaneous Q6H WA  .  midodrine  10 mg Oral TID WC  . multivitamin with minerals  1 tablet Oral Daily  . pantoprazole (PROTONIX) IV  40 mg Intravenous Q12H  . vitamin B-12  500 mcg Oral Daily   Continuous Infusions: . sodium chloride 75 mL/hr at 12/12/19 1148     LOS: 0 days    Time spent: 35 minutes    Barb Merino, MD [facility] Pager [phone]

## 2019-12-12 NOTE — ED Notes (Signed)
Patient was cleaned and a male pure wick was placed at 80 mmHg.

## 2019-12-12 NOTE — ED Notes (Signed)
Patient has vomited about 100 ml of coffee ground emesis, but is refusing zofran.

## 2019-12-13 DIAGNOSIS — I951 Orthostatic hypotension: Secondary | ICD-10-CM | POA: Diagnosis not present

## 2019-12-13 DIAGNOSIS — R112 Nausea with vomiting, unspecified: Secondary | ICD-10-CM

## 2019-12-13 DIAGNOSIS — I483 Typical atrial flutter: Secondary | ICD-10-CM

## 2019-12-13 DIAGNOSIS — E119 Type 2 diabetes mellitus without complications: Secondary | ICD-10-CM

## 2019-12-13 DIAGNOSIS — C189 Malignant neoplasm of colon, unspecified: Secondary | ICD-10-CM

## 2019-12-13 LAB — GLUCOSE, CAPILLARY
Glucose-Capillary: 111 mg/dL — ABNORMAL HIGH (ref 70–99)
Glucose-Capillary: 154 mg/dL — ABNORMAL HIGH (ref 70–99)
Glucose-Capillary: 100 mg/dL — ABNORMAL HIGH (ref 70–99)

## 2019-12-13 LAB — MRSA PCR SCREENING: MRSA by PCR: NEGATIVE

## 2019-12-13 MED ORDER — OMEPRAZOLE 20 MG PO CPDR: 20.0000 mg | DELAYED_RELEASE_CAPSULE | Freq: Two times a day (BID) | ORAL | 1 refills | Status: DC

## 2019-12-13 MED ORDER — CHLORHEXIDINE GLUCONATE CLOTH 2 % EX PADS: 6.0000 | MEDICATED_PAD | Freq: Every day | CUTANEOUS | Status: DC

## 2019-12-13 MED ORDER — HYOSCYAMINE SULFATE 0.125 MG PO TABS: 0.1250 mg | ORAL_TABLET | ORAL | 0 refills | Status: DC | PRN

## 2019-12-13 NOTE — TOC Progression Note (Signed)
Transition of Care (TOC) - Progression Note    Patient Details  Name: Clarence Andrews. MRN: [id number] Date of Birth: [birth year]  Transition [facility]) CM/SW Contact  Purcell Mouton, RN Phone Number: 12/13/2019, 10:49 AM  Clinical Narrative:     Spoke with pt to confirm that he would transport back to [facility] in [place]. A call was made to admission coordinator Arbie Cookey, who was in a meeting. Arbie Cookey returned CM's call, pt may return. Arbie Cookey states that she will need discharge summary and nothing else. Also a call when pt is headed out to facility. Waiting for MD to sign FL2 and discharge summary.        Expected Discharge Plan and Services           Expected Discharge Date: 12/13/19                                     Social Determinants of Health (SDOH) Interventions    Readmission Risk Interventions No flowsheet data found.

## 2019-12-13 NOTE — Plan of Care (Signed)

## 2019-12-13 NOTE — Plan of Care (Signed)
Report called to nurse receiving Candis Shine. Nurse familiar w/ pt as he was living at this SNF prior to hospitalization. All questions & concerns addressed. [facility] transported patient & VS taken & stable -given to [facility] for documentation. (BP systolic [phone]  MD notified & midodrine continued d/t orthostatic hypotension per MD Dante Gang) No pain, n/v or SOB on my shift 0700-current AVS given to patient. **Wheelchair is unable to go via ambulance so wheelchair is clearly labeled & stored in conference room.

## 2019-12-13 NOTE — Discharge Summary (Signed)
Physician Discharge Summary  [name] NID:[id number] DOB: July 16, 1950 DOA: 12/11/2019  PCP: Patient, No Pcp Per  Admit date: 12/11/2019 Discharge date: 12/13/2019  Admitted From: SNF Disposition:  SNF  Recommendations for Outpatient Follow-up:  1. Keep up appointment with your oncologist as a scheduled.  Home Health: Not applicable Equipment/Devices: Not applicable  Discharge Condition: Fair CODE STATUS: Full code Diet recommendation: Regular diet  Discharge summary: 69 year old gentleman with history of metastatic colon cancer on chemotherapy, recently progressive disease, progressive debility and currently at a nursing home, paroxysmal A. fib, coronary artery disease status post CABG in 2017 and chronic anemia with baseline hemoglobin about 9, history of orthostatic hypotension on midodrine, type 2 diabetes who received his second line of chemotherapy on 11/8, presented to cancer center on 11/10 with cough and nausea as well as coffee-ground emesis.  He was found to have low blood pressure so sent to ER.  Patient himself has progressive loss of appetite, nausea, abdominal pain and cramps. At the emergency room, blood pressure stable on supine position.  On room air.  CT chest negative for PE.  Admitted for symptomatic treatment, failure to thrive and hypertension.  Assessment & Plan:   Nausea/poor appetite/failure to thrive/coffee-ground emesis: Recent normal EGD.  Suspected due to chemotherapy.  Treated with IV fluids and PPI.  Improved symptoms.  Hemoglobin stable. Tolerating diet.  Keep on full liquid diet and soft diet. Started on Levsin that we will continue.  Given a dose of dexamethasone with improvement.  Suspected upper GI bleeding: Had one episode of emesis that looked like coffee-ground.  FOBT positive.  Hemoglobin at baseline.  Recent upper GI endoscopy was normal.  He is not on any PPI.  Started on PPI.  Currently no indication for endoscopic evaluation. Will  discharge patient on PPI.  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  Extensive cardiac investigations were negative. Orthostatic precautions.  Continue midodrine. Patient is at end-of-life with metastatic colon cancer, he is unlikely to benefit with any more invasive investigations. Patient has no orthostatic clinical symptoms today.  Type 2 diabetes: On glipizide.    Resume.  Paroxysmal A. fib: Currently sinus rhythm.  On digoxin.  Unable to anticoagulate.  Metastatic colon cancer with progressive disease/failure to thrive/advanced physical deconditioning: Patient is on chemotherapy.  Followed by his oncologist. Patient desires to continue chemotherapy and believes in miracles. We will continue supportive care.  Hopefully he is strong enough to go for another cycle of chemo. He will benefit with palliative team referral.  Remains clinically stable.  Chronically sick.  Able to transfer back to nursing home to continue care.  Magnesium was 1.6, was replaced before discharge.  Discharge Diagnoses:  Principal Problem:   Nausea & vomiting Active Problems:   Type 2 diabetes mellitus without complication (HCC)   Atrial flutter (HCC)   Orthostatic hypotension   Leukocytosis    Discharge Instructions  Discharge Instructions    Diet - low sodium heart healthy   Complete by: As directed    Gradually advance diet, stay on more liquid and soft food   Increase activity slowly   Complete by: As directed    No wound care   Complete by: As directed      Allergies as of 12/13/2019   No Known Allergies     Medication List    TAKE these medications   acetaminophen 325 MG tablet Commonly known as: TYLENOL Take 650 mg by mouth every 6 (six) hours  as needed for mild pain.   ascorbic acid 500 MG tablet Commonly known as: VITAMIN C Take 1 tablet (500 mg total) by mouth daily.   calcium carbonate 1500 (600 Ca) MG Tabs  tablet Commonly known as: OSCAL Take 1,500 mg by mouth 2 (two) times daily as needed (heartburn).   Chromium-Cinnamon [phone] MCG-MG Caps Take 2,000 mg by mouth daily.   D3-1000 PO Take 1,000 Units by mouth daily.   Decubi-Vite Caps Take 1 capsule by mouth daily.   digoxin 0.25 MG tablet Commonly known as: LANOXIN Take 1 tablet (0.25 mg total) by mouth daily.   ferrous sulfate 325 (65 FE) MG EC tablet Take 325 mg by mouth daily with breakfast.   glipiZIDE 10 MG tablet Commonly known as: GLUCOTROL Take 10 mg by mouth daily before breakfast.   hyoscyamine 0.125 MG tablet Commonly known as: LEVSIN Take 1 tablet (0.125 mg total) by mouth every 4 (four) hours as needed for cramping.   levalbuterol 45 MCG/ACT inhaler Commonly known as: XOPENEX HFA Inhale 2 puffs into the lungs every 6 (six) hours as needed for wheezing.   loperamide 2 MG capsule Commonly known as: IMODIUM Take 1 capsule by mouth 4 (four) times daily as needed for diarrhea or loose stools.   magnesium oxide 400 MG tablet Commonly known as: MAG-OX Take 800 mg by mouth daily.   midodrine 10 MG tablet Commonly known as: PROAMATINE Take 1 tablet (10 mg total) by mouth 3 (three) times daily with meals.   multivitamin with minerals Tabs tablet Take 1 tablet by mouth daily.   omeprazole 20 MG capsule Commonly known as: PriLOSEC Take 1 capsule (20 mg total) by mouth 2 (two) times daily.   PROPYLENE GLYCOL OP Place 1 drop into both eyes as needed (dry eyes).   Turmeric 500 MG Caps Take 1,000 mg by mouth 2 (two) times daily.   vitamin B-12 500 MCG tablet Commonly known as: CYANOCOBALAMIN Take 500 mcg by mouth daily.       Follow-up Information    [place] [facility].   Specialty: Emergency Medicine Why: As needed, If symptoms worsen Contact information: [place] [id number] [place] [facility] Follow up.   Specialty: [place] Contact information: [address] [place] [phone]             No Known Allergies  Consultations:  Oncology   Procedures/Studies: CT Angio Chest PE W and/or Wo Contrast  Result Date: 12/11/2019 CLINICAL DATA:  [age]-year-old male with concern for pulmonary embolism. Colon cancer. EXAM: CT ANGIOGRAPHY CHEST WITH CONTRAST TECHNIQUE: Multidetector CT imaging of the chest was performed using the standard protocol during bolus administration of intravenous contrast. Multiplanar CT image reconstructions and MIPs were obtained to evaluate the vascular anatomy. CONTRAST:  65mL OMNIPAQUE IOHEXOL 350 MG/ML SOLN COMPARISON:  Chest CT dated 04/28/2019 and radiograph dated 09/10/2019 FINDINGS: Cardiovascular: There is no cardiomegaly or pericardial effusion. There is coronary vascular calcification and postsurgical changes of CABG. The ascending aorta is enlarged measuring 4.5 cm in diameter similar to prior CT. Evaluation of the aorta is limited due to suboptimal opacification and timing of the contrast. No pulmonary artery embolus identified. Mediastinum/Nodes: No hilar or mediastinal adenopathy. The esophagus and the thyroid gland are grossly unremarkable. No mediastinal fluid collection. Right-sided Port-A-Cath with tip at the cavoatrial junction. Lungs/Pleura: Clusters of ground-glass nodularity in the left  lower lobe most concerning for pneumonia versus aspiration. Clinical correlation is recommended. There is a calcified granuloma in the lingula anteriorly. No lobar consolidation, pleural effusion, pneumothorax. The central airways are patent. Small amount of mucus content noted within the distal esophagus and left mainstem bronchus. Upper Abdomen: Multiple hepatic hypodense lesions most consistent with metastatic disease. Multiple gallstones. Partially visualized right renal hypodense lesion,  likely a cyst. Musculoskeletal: Osteopenia with degenerative changes of the spine. No acute osseous pathology. Median sternotomy wires. Review of the MIP images confirms the above findings. IMPRESSION: 1. No CT evidence of pulmonary artery embolus. 2. Clusters of ground-glass nodularity in the left lower lobe most concerning for pneumonia versus aspiration. Clinical correlation is recommended. 3. Multiple hepatic metastatic disease. 4. Cholelithiasis. 5. Aortic Atherosclerosis (ICD10-I70.0). Electronically Signed   By: Anner Crete M.D.   On: 12/11/2019 17:02   CT Abdomen Pelvis W Contrast  Result Date: 12/06/2019 CLINICAL DATA:  69 year old male with history of colon cancer. Restaging examination. Ongoing chemotherapy. EXAM: CT ABDOMEN AND PELVIS WITH CONTRAST TECHNIQUE: Multidetector CT imaging of the abdomen and pelvis was performed using the standard protocol following bolus administration of intravenous contrast. CONTRAST:  162mL OMNIPAQUE IOHEXOL 300 MG/ML  SOLN COMPARISON:  CT the abdomen and pelvis 09/11/2019. FINDINGS: Lower chest: Left atrial appendage ligation clip. Atherosclerotic calcifications in left anterior descending, left circumflex and right coronary arteries. Postoperative changes of median sternotomy for CABG, including LIMA to the LAD. Calcified granulomas in the anterior aspect of the left upper lobe. Hepatobiliary: Multiple hypovascular hepatic lesions appear increased in number and size compared to the prior examination. The largest of these lesions is in the central aspect of the liver predominantly in segment 4A (axial image 21 of series 2), currently measuring 6.6 x 5.5 cm (previously 5.7 x 3.9 cm). Another prominent lesion in the inferior aspect of the right lobe of the liver between segments 5 and 6 (axial image 33 of series 2), currently 5.4 x 4.0 cm (previously 3.5 x 3.3 cm. Several small new lesions are also noted in segment 2 (axial image 22 of series 2), central aspect of  the left lobe of the liver between segments 2 and 3 (axial image 23 of series 2), segment 8 (axial image 16 of series 2), and segment 7 (axial image 24 of series 2). No intra or extrahepatic biliary ductal dilatation. Tiny calcified gallstones in the neck of the gallbladder. No findings to suggest an acute cholecystitis at this time. Pancreas: 1.4 x 1.0 cm low-attenuation lesion in the head of the pancreas (image 35 of series 2), stable compared to the prior study, too small to characterize. No other pancreatic mass. No pancreatic ductal dilatation. No peripancreatic fluid collections or inflammatory changes. Spleen: Unremarkable. Adrenals/Urinary Tract: Multiple low-attenuation lesions in both kidneys, compatible with simple cysts, largest of which is exophytic in the posterior aspect of the upper pole the right kidney measuring 5.5 x 4.1 cm. Other subcentimeter low-attenuation lesions in both kidneys are too small to definitively characterize, but statistically likely to represent tiny cysts. Bilateral adrenal glands are normal in appearance. No hydroureteronephrosis. Asymmetric mural thickening of the urinary bladder wall, most severe posteriorly where there also appears to be some increased urothelial enhancement, measuring up to 1.4 cm in thickness (sagittal image 70 of series 6). Stomach/Bowel: The appearance of the stomach is normal. No pathologic dilatation of small bowel or colon. Stent traversing an area of stenosis in the distal sigmoid colon. The appendix is not confidently identified and  may be surgically absent. Regardless, there are no inflammatory changes noted adjacent to the cecum to suggest the presence of an acute appendicitis at this time. Vascular/Lymphatic: Mild atherosclerotic disease in the abdominal aorta. No aneurysm or dissection noted in the abdominal or pelvic vasculature. Borderline enlarged peripancreatic lymph node (axial image 34 of series 2), stable compared to prior examination.  No other definite pathologically enlarged lymph nodes are noted in the abdomen or pelvis. Reproductive: Prostate gland and seminal vesicles are unremarkable in appearance. Other: No significant volume of ascites.  No pneumoperitoneum. Musculoskeletal: There are no aggressive appearing lytic or blastic lesions noted in the visualized portions of the skeleton. IMPRESSION: 1. Progressive metastatic disease to the liver with increased number and size of numerous hypovascular lesions, as detailed above. 2. No other sites of extra hepatic metastatic disease confidently identified in the abdomen or pelvis. 3. Profound mural thickening in the posterior aspect of the urinary bladder with some urothelial hyperenhancement, increased compared to the prior examination. This may reflect post treatment changes from prior radiation therapy, however, with urologic consultation should be considered for further clinical evaluation. 4. Small cystic lesion in the head of the pancreas, nonspecific, but stable compared to the prior study and favored to represent a small pseudocyst or other benign lesion. Attention at time of routine follow-up imaging is recommended. 5. Stable position of stent in the sigmoid colon, without evidence of bowel obstruction. 6. Aortic atherosclerosis. 7. Cholelithiasis without evidence of acute cholecystitis at this time. 8. Additional incidental findings, as above. Electronically Signed   By: Vinnie Langton M.D.   On: 12/06/2019 09:10   (Echo, Carotid, EGD, Colonoscopy, ERCP)    Subjective: Patient seen and examined.  Today he has no symptoms.  Since last night he was able to eat full liquid diet and denies any nausea vomiting.  Denies any abdominal pain since last night.  She thinks she is ready to get back to nursing home but she will need to ride an ambulance not in his wheelchair.   Discharge Exam: Vitals:   12/13/19 0013 12/13/19 0519  BP: (!) 149/83 (!) 153/95  Pulse: 72 81  Resp: 15 16   Temp: 98 F (36.7 C) 98.2 F (36.8 C)  SpO2: 99% 100%   Vitals:   12/12/19 2300 12/12/19 2315 12/13/19 0013 12/13/19 0519  BP: (!) 156/88  (!) 149/83 (!) 153/95  Pulse: 74 73 72 81  Resp: 18 13 15 16   Temp:   98 F (36.7 C) 98.2 F (36.8 C)  TempSrc:   Oral Oral  SpO2: 98% 99% 99% 100%  Weight:   82.5 kg 83.1 kg  Height:   6\' 2"  (1.88 m)     General: Pt is alert, awake, not in acute distress Chronically sick looking.  Not in any distress.  On room air. Cardiovascular: RRR, S1/S2 +, no rubs, no gallops, no added sound. Port-A-Cath present. Respiratory: CTA bilaterally, no wheezing, no rhonchi, no added sounds. Abdominal: Soft, ND, bowel sounds +, mild generalized tenderness.  No rigidity or guarding. Extremities: no edema, no cyanosis    The results of significant diagnostics from this hospitalization (including imaging, microbiology, ancillary and laboratory) are listed below for reference.     Microbiology: Recent Results (from the past 240 hour(s))  Respiratory Panel by RT PCR (Flu A&B, Covid) - Nasopharyngeal Swab     Status: None   Collection Time: 12/11/19  9:00 PM   Specimen: Nasopharyngeal Swab  Result Value Ref Range Status  SARS Coronavirus 2 by RT PCR NEGATIVE NEGATIVE Final    Comment: (NOTE) SARS-CoV-2 target nucleic acids are NOT DETECTED.  The SARS-CoV-2 RNA is generally detectable in upper respiratoy specimens during the acute phase of infection. The lowest concentration of SARS-CoV-2 viral copies this assay can detect is 131 copies/mL. A negative result does not preclude SARS-Cov-2 infection and should not be used as the sole basis for treatment or other patient management decisions. A negative result may occur with  improper specimen collection/handling, submission of specimen other than nasopharyngeal swab, presence of viral mutation(s) within the areas targeted by this assay, and inadequate number of viral copies (<131 copies/mL). A negative  result must be combined with clinical observations, patient history, and epidemiological information. The expected result is Negative.  Fact Sheet for Patients:  PinkCheek.be  Fact Sheet for Healthcare Providers:  GravelBags.it  This test is no t yet approved or cleared by the [place] FDA and  has been authorized for detection and/or diagnosis of SARS-CoV-2 by FDA under an Emergency Use Authorization (EUA). This EUA will remain  in effect (meaning this test can be used) for the duration of the COVID-19 declaration under Section 564(b)(1) of the Act, 21 U.S.C. section 360bbb-3(b)(1), unless the authorization is terminated or revoked sooner.     Influenza A by PCR NEGATIVE NEGATIVE Final   Influenza B by PCR NEGATIVE NEGATIVE Final    Comment: (NOTE) The Xpert Xpress SARS-CoV-2/FLU/RSV assay is intended as an aid in  the diagnosis of influenza from Nasopharyngeal swab specimens and  should not be used as a sole basis for treatment. Nasal washings and  aspirates are unacceptable for Xpert Xpress SARS-CoV-2/FLU/RSV  testing.  Fact Sheet for Patients: PinkCheek.be  Fact Sheet for Healthcare Providers: GravelBags.it  This test is not yet approved or cleared by the [place] FDA and  has been authorized for detection and/or diagnosis of SARS-CoV-2 by  FDA under an Emergency Use Authorization (EUA). This EUA will remain  in effect (meaning this test can be used) for the duration of the  Covid-19 declaration under Section 564(b)(1) of the Act, 21  U.S.C. section 360bbb-3(b)(1), unless the authorization is  terminated or revoked. Performed at [facility], [place] [address], [place], [place] [address]   MRSA PCR Screening     Status: None   Collection Time: 12/13/19  5:48 AM   Specimen: Nasal Mucosa; Nasopharyngeal  Result Value Ref Range  Status   MRSA by PCR NEGATIVE NEGATIVE Final    Comment:        The GeneXpert MRSA Assay (FDA approved for NASAL specimens only), is one component of a comprehensive MRSA colonization surveillance program. It is not intended to diagnose MRSA infection nor to guide or monitor treatment for MRSA infections. Performed at [facility], [place] [address], [place], [place] [address]      Labs: BNP (last 3 results) Recent Labs    04/28/19 0134  BNP 440.1*   Basic Metabolic Panel: Recent Labs  Lab 12/09/19 0842 12/11/19 1441 12/12/19 0614  NA 139 138 135  K 4.1 4.0 4.2  CL 102 100 99  CO2 27 26 25   GLUCOSE 279* 225* 245*  BUN 21 25* 30*  CREATININE 1.14 1.10 1.08  CALCIUM 8.9 8.4* 8.5*  MG 1.6*  --  1.6*  PHOS  --   --  3.0   Liver Function Tests: Recent Labs  Lab 12/09/19 0842 12/12/19 0614  AST 23 22  ALT  16 19  ALKPHOS 154* 117  BILITOT 0.3 0.7  PROT 6.6 6.7  ALBUMIN 2.7* 2.9*   No results for input(s): LIPASE, AMYLASE in the last 168 hours. No results for input(s): AMMONIA in the last 168 hours. CBC: Recent Labs  Lab 12/09/19 0842 12/11/19 1441 12/12/19 0614  WBC 6.6 11.2* 13.1*  NEUTROABS 4.4 10.0* 12.1*  HGB 8.8* 9.1* 9.1*  HCT 29.1* 29.8* 29.9*  MCV 86.1 87.9 87.4  PLT 320 301 259   Cardiac Enzymes: No results for input(s): CKTOTAL, CKMB, CKMBINDEX, TROPONINI in the last 168 hours. BNP: Invalid input(s): POCBNP CBG: Recent Labs  Lab 12/12/19 1226 12/12/19 1813 12/12/19 2016 12/13/19 0020 12/13/19 0524  GLUCAP 203* 190* 191* 154* 111*   D-Dimer No results for input(s): DDIMER in the last 72 hours. Hgb A1c No results for input(s): HGBA1C in the last 72 hours. Lipid Profile No results for input(s): CHOL, HDL, LDLCALC, TRIG, CHOLHDL, LDLDIRECT in the last 72 hours. Thyroid function studies No results for input(s): TSH, T4TOTAL, T3FREE, THYROIDAB in the last 72 hours.  Invalid input(s): FREET3 Anemia work up Recent  Labs    12/12/19 0615  FERRITIN 228  TIBC 232*  IRON 101   Urinalysis    Component Value Date/Time   COLORURINE YELLOW 09/10/2019 2239   APPEARANCEUR CLOUDY (A) 09/10/2019 2239   LABSPEC 1.017 09/10/2019 2239   PHURINE 5.0 09/10/2019 2239   GLUCOSEU NEGATIVE 09/10/2019 2239   HGBUR LARGE (A) 09/10/2019 2239   BILIRUBINUR NEGATIVE 09/10/2019 2239   KETONESUR 5 (A) 09/10/2019 2239   PROTEINUR 100 (A) 09/10/2019 2239   NITRITE NEGATIVE 09/10/2019 2239   LEUKOCYTESUR LARGE (A) 09/10/2019 2239   Sepsis Labs Invalid input(s): PROCALCITONIN,  WBC,  LACTICIDVEN Microbiology Recent Results (from the past 240 hour(s))  Respiratory Panel by RT PCR (Flu A&B, Covid) - Nasopharyngeal Swab     Status: None   Collection Time: 12/11/19  9:00 PM   Specimen: Nasopharyngeal Swab  Result Value Ref Range Status   SARS Coronavirus 2 by RT PCR NEGATIVE NEGATIVE Final    Comment: (NOTE) SARS-CoV-2 target nucleic acids are NOT DETECTED.  The SARS-CoV-2 RNA is generally detectable in upper respiratoy specimens during the acute phase of infection. The lowest concentration of SARS-CoV-2 viral copies this assay can detect is 131 copies/mL. A negative result does not preclude SARS-Cov-2 infection and should not be used as the sole basis for treatment or other patient management decisions. A negative result may occur with  improper specimen collection/handling, submission of specimen other than nasopharyngeal swab, presence of viral mutation(s) within the areas targeted by this assay, and inadequate number of viral copies (<131 copies/mL). A negative result must be combined with clinical observations, patient history, and epidemiological information. The expected result is Negative.  Fact Sheet for Patients:  PinkCheek.be  Fact Sheet for Healthcare Providers:  GravelBags.it  This test is no t yet approved or cleared by the [place]  FDA and  has been authorized for detection and/or diagnosis of SARS-CoV-2 by FDA under an Emergency Use Authorization (EUA). This EUA will remain  in effect (meaning this test can be used) for the duration of the COVID-19 declaration under Section 564(b)(1) of the Act, 21 U.S.C. section 360bbb-3(b)(1), unless the authorization is terminated or revoked sooner.     Influenza A by PCR NEGATIVE NEGATIVE Final   Influenza B by PCR NEGATIVE NEGATIVE Final    Comment: (NOTE) The Xpert Xpress SARS-CoV-2/FLU/RSV assay is intended as an aid in  the diagnosis of influenza from Nasopharyngeal swab specimens and  should not be used as a sole basis for treatment. Nasal washings and  aspirates are unacceptable for Xpert Xpress SARS-CoV-2/FLU/RSV  testing.  Fact Sheet for Patients: PinkCheek.be  Fact Sheet for Healthcare Providers: GravelBags.it  This test is not yet approved or cleared by the [place] FDA and  has been authorized for detection and/or diagnosis of SARS-CoV-2 by  FDA under an Emergency Use Authorization (EUA). This EUA will remain  in effect (meaning this test can be used) for the duration of the  Covid-19 declaration under Section 564(b)(1) of the Act, 21  U.S.C. section 360bbb-3(b)(1), unless the authorization is  terminated or revoked. Performed at [facility], [place] [address] [place], [place] [address]   MRSA PCR Screening     Status: None   Collection Time: 12/13/19  5:48 AM   Specimen: Nasal Mucosa; Nasopharyngeal  Result Value Ref Range Status   MRSA by PCR NEGATIVE NEGATIVE Final    Comment:        The GeneXpert MRSA Assay (FDA approved for NASAL specimens only), is one component of a comprehensive MRSA colonization surveillance program. It is not intended to diagnose MRSA infection nor to guide or monitor treatment for MRSA infections. Performed at [facility], [place] [address], [place], [place] [address]      Time coordinating discharge: 40 minutes  SIGNED:   Barb Merino, MD  [facility] 12/13/2019, 11:01 AM

## 2019-12-13 NOTE — Progress Notes (Addendum)
HEMATOLOGY-ONCOLOGY PROGRESS NOTE  SUBJECTIVE: He reports that he feels much better today.  He is not having any recurrent nausea or vomiting.  Abdominal cramping has stopped.  Wants to try to eat some breakfast.  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HCC)  04/02/2019 Initial Diagnosis   Cancer of left colon (HCC)   04/11/2019 - 11/27/2019 Chemotherapy   The patient had palonosetron (ALOXI) injection 0.25 mg, 0.25 mg, Intravenous,  Once, 8 of 8 cycles Administration: 0.25 mg (04/11/2019), 0.25 mg (05/28/2019), 0.25 mg (06/11/2019), 0.25 mg (06/25/2019), 0.25 mg (07/31/2019), 0.25 mg (08/13/2019), 0.25 mg (08/27/2019) pegfilgrastim-cbqv (UDENYCA) injection 6 mg, 6 mg, Subcutaneous, Once, 5 of 5 cycles Administration: 6 mg (05/30/2019), 6 mg (06/13/2019), 6 mg (06/27/2019), 6 mg (08/15/2019), 6 mg (08/29/2019) fluorouracil (ADRUCIL) 4,650 mg in sodium chloride 0.9 % 1,000 mL chemo infusion, 2,000 mg/m2 = 4,650 mg (100 % of original dose 2,000 mg/m2), Intravenous, Once, 1 of 1 cycle Dose modification: 2,000 mg/m2 (original dose 2,000 mg/m2, Cycle 5, Reason: Provider Judgment) Administration: 4,650 mg (07/31/2019) leucovorin 972 mg in dextrose 5 % 250 mL infusion, 400 mg/m2 = 972 mg, Intravenous,  Once, 8 of 8 cycles Administration: 972 mg (04/11/2019), 972 mg (05/28/2019), 972 mg (06/11/2019), 972 mg (06/25/2019), 932 mg (07/31/2019), 932 mg (08/13/2019), 932 mg (08/27/2019) oxaliplatin (ELOXATIN) 200 mg in dextrose 5 % 500 mL chemo infusion, 83 mg/m2 = 205 mg, Intravenous,  Once, 8 of 8 cycles Dose modification: 65 mg/m2 (original dose 85 mg/m2, Cycle 6, Reason: Provider Judgment) Administration: 200 mg (04/11/2019), 200 mg (05/28/2019), 200 mg (06/11/2019), 200 mg (06/25/2019), 150 mg (07/31/2019), 150 mg (08/13/2019), 150 mg (08/27/2019) fluorouracil (ADRUCIL) chemo injection 950 mg, 400 mg/m2 = 950 mg, Intravenous,  Once, 1 of 1 cycle Administration: 950 mg (04/11/2019) fluorouracil (ADRUCIL) 5,850 mg in sodium chloride  0.9 % 133 mL chemo infusion, 2,400 mg/m2 = 5,850 mg, Intravenous, 1 Day/Dose, 12 of 13 cycles Dose modification: 2,000 mg/m2 (original dose 2,400 mg/m2, Cycle 2, Reason: Provider Judgment), 1,600 mg/m2 (original dose 2,400 mg/m2, Cycle 8, Reason: Provider Judgment) Administration: 5,850 mg (04/11/2019), 4,850 mg (05/28/2019), 4,850 mg (06/11/2019), 4,850 mg (06/25/2019), 4,650 mg (08/13/2019), 4,650 mg (08/27/2019), 3,300 mg (09/26/2019), 3,300 mg (10/14/2019), 3,300 mg (10/28/2019), 3,300 mg (11/11/2019), 3,300 mg (11/25/2019)  for chemotherapy treatment.    12/09/2019 -  Chemotherapy   The patient had palonosetron (ALOXI) injection 0.25 mg, 0.25 mg, Intravenous,  Once, 1 of 4 cycles Administration: 0.25 mg (12/09/2019) irinotecan (CAMPTOSAR) 260 mg in sodium chloride 0.9 % 500 mL chemo infusion, 125 mg/m2 = 260 mg (100 % of original dose 125 mg/m2), Intravenous,  Once, 1 of 4 cycles Dose modification: 125 mg/m2 (original dose 125 mg/m2, Cycle 1, Reason: Provider Judgment) Administration: 260 mg (12/09/2019) fluorouracil (ADRUCIL) 3,400 mg in sodium chloride 0.9 % 82 mL chemo infusion, 1,600 mg/m2 = 3,400 mg (100 % of original dose 1,600 mg/m2), Intravenous, 1 Day/Dose, 1 of 4 cycles Dose modification: 1,600 mg/m2 (original dose 1,600 mg/m2, Cycle 1, Reason: Provider Judgment) Administration: 3,400 mg (12/09/2019) bevacizumab-awwb (MVASI) 400 mg in sodium chloride 0.9 % 100 mL chemo infusion, 5 mg/kg = 400 mg (100 % of original dose 5 mg/kg), Intravenous,  Once, 1 of 4 cycles Dose modification: 5 mg/kg (original dose 5 mg/kg, Cycle 1) Administration: 400 mg (12/09/2019) bevacizumab-bvzr (ZIRABEV) 400 mg in sodium chloride 0.9 % 100 mL chemo infusion, 5 mg/kg = 400 mg, Intravenous,  Once, 1 of 1 cycle leucovorin 422 mg in sodium chloride 0.9 %   250 mL infusion, 200 mg/m2 = 422 mg (100 % of original dose 200 mg/m2), Intravenous,  Once, 1 of 4 cycles Dose modification: 200 mg/m2 (original dose 200 mg/m2, Cycle 1,  Reason: Provider Judgment) Administration: 422 mg (12/09/2019)  for chemotherapy treatment.    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HCC)  04/02/2019 Initial Diagnosis   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HCC)   04/11/2019 - 11/27/2019 Chemotherapy   The patient had palonosetron (ALOXI) injection 0.25 mg, 0.25 mg, Intravenous,  Once, 8 of 8 cycles Administration: 0.25 mg (04/11/2019), 0.25 mg (05/28/2019), 0.25 mg (06/11/2019), 0.25 mg (06/25/2019), 0.25 mg (07/31/2019), 0.25 mg (08/13/2019), 0.25 mg (08/27/2019) pegfilgrastim-cbqv (UDENYCA) injection 6 mg, 6 mg, Subcutaneous, Once, 5 of 5 cycles Administration: 6 mg (05/30/2019), 6 mg (06/13/2019), 6 mg (06/27/2019), 6 mg (08/15/2019), 6 mg (08/29/2019) fluorouracil (ADRUCIL) 4,650 mg in sodium chloride 0.9 % 1,000 mL chemo infusion, 2,000 mg/m2 = 4,650 mg (100 % of original dose 2,000 mg/m2), Intravenous, Once, 1 of 1 cycle Dose modification: 2,000 mg/m2 (original dose 2,000 mg/m2, Cycle 5, Reason: Provider Judgment) Administration: 4,650 mg (07/31/2019) leucovorin 972 mg in dextrose 5 % 250 mL infusion, 400 mg/m2 = 972 mg, Intravenous,  Once, 8 of 8 cycles Administration: 972 mg (04/11/2019), 972 mg (05/28/2019), 972 mg (06/11/2019), 972 mg (06/25/2019), 932 mg (07/31/2019), 932 mg (08/13/2019), 932 mg (08/27/2019) oxaliplatin (ELOXATIN) 200 mg in dextrose 5 % 500 mL chemo infusion, 83 mg/m2 = 205 mg, Intravenous,  Once, 8 of 8 cycles Dose modification: 65 mg/m2 (original dose 85 mg/m2, Cycle 6, Reason: Provider Judgment) Administration: 200 mg (04/11/2019), 200 mg (05/28/2019), 200 mg (06/11/2019), 200 mg (06/25/2019), 150 mg (07/31/2019), 150 mg (08/13/2019), 150 mg (08/27/2019) fluorouracil (ADRUCIL) chemo injection 950 mg, 400 mg/m2 = 950 mg, Intravenous,  Once, 1 of 1 cycle Administration: 950 mg (04/11/2019) fluorouracil (ADRUCIL) 5,850 mg in sodium chloride 0.9 % 133 mL chemo infusion, 2,400 mg/m2 = 5,850 mg, Intravenous, 1 Day/Dose, 12 of 13 cycles Dose  modification: 2,000 mg/m2 (original dose 2,400 mg/m2, Cycle 2, Reason: Provider Judgment), 1,600 mg/m2 (original dose 2,400 mg/m2, Cycle 8, Reason: Provider Judgment) Administration: 5,850 mg (04/11/2019), 4,850 mg (05/28/2019), 4,850 mg (06/11/2019), 4,850 mg (06/25/2019), 4,650 mg (08/13/2019), 4,650 mg (08/27/2019), 3,300 mg (09/26/2019), 3,300 mg (10/14/2019), 3,300 mg (10/28/2019), 3,300 mg (11/11/2019), 3,300 mg (11/25/2019)  for chemotherapy treatment.    12/09/2019 -  Chemotherapy   The patient had palonosetron (ALOXI) injection 0.25 mg, 0.25 mg, Intravenous,  Once, 1 of 4 cycles Administration: 0.25 mg (12/09/2019) irinotecan (CAMPTOSAR) 260 mg in sodium chloride 0.9 % 500 mL chemo infusion, 125 mg/m2 = 260 mg (100 % of original dose 125 mg/m2), Intravenous,  Once, 1 of 4 cycles Dose modification: 125 mg/m2 (original dose 125 mg/m2, Cycle 1, Reason: Provider Judgment) Administration: 260 mg (12/09/2019) fluorouracil (ADRUCIL) 3,400 mg in sodium chloride 0.9 % 82 mL chemo infusion, 1,600 mg/m2 = 3,400 mg (100 % of original dose 1,600 mg/m2), Intravenous, 1 Day/Dose, 1 of 4 cycles Dose modification: 1,600 mg/m2 (original dose 1,600 mg/m2, Cycle 1, Reason: Provider Judgment) Administration: 3,400 mg (12/09/2019) bevacizumab-awwb (MVASI) 400 mg in sodium chloride 0.9 % 100 mL chemo infusion, 5 mg/kg = 400 mg (100 % of original dose 5 mg/kg), Intravenous,  Once, 1 of 4 cycles Dose modification: 5 mg/kg (original dose 5 mg/kg, Cycle 1) Administration: 400 mg (12/09/2019) bevacizumab-bvzr (ZIRABEV) 400 mg in sodium chloride 0.9 % 100 mL chemo infusion, 5 mg/kg = 400 mg, Intravenous,  Once, 1 of 1   cycle leucovorin 422 mg in sodium chloride 0.9 % 250 mL infusion, 200 mg/m2 = 422 mg (100 % of original dose 200 mg/m2), Intravenous,  Once, 1 of 4 cycles Dose modification: 200 mg/m2 (original dose 200 mg/m2, Cycle 1, Reason: Provider Judgment) Administration: 422 mg (12/09/2019)  for chemotherapy treatment.     Malignant neoplasm of colon ([place])  05/01/2019 Initial Diagnosis   Malignant neoplasm of colon ([place])   12/09/2019 -  Chemotherapy   The patient had palonosetron (ALOXI) injection 0.25 mg, 0.25 mg, Intravenous,  Once, 1 of 4 cycles Administration: 0.25 mg (12/09/2019) irinotecan (CAMPTOSAR) 260 mg in sodium chloride 0.9 % 500 mL chemo infusion, 125 mg/m2 = 260 mg (100 % of original dose 125 mg/m2), Intravenous,  Once, 1 of 4 cycles Dose modification: 125 mg/m2 (original dose 125 mg/m2, Cycle 1, Reason: Provider Judgment) Administration: 260 mg (12/09/2019) fluorouracil (ADRUCIL) 3,400 mg in sodium chloride 0.9 % 82 mL chemo infusion, 1,600 mg/m2 = 3,400 mg (100 % of original dose 1,600 mg/m2), Intravenous, 1 Day/Dose, 1 of 4 cycles Dose modification: 1,600 mg/m2 (original dose 1,600 mg/m2, Cycle 1, Reason: Provider Judgment) Administration: 3,400 mg (12/09/2019) bevacizumab-awwb (MVASI) 400 mg in sodium chloride 0.9 % 100 mL chemo infusion, 5 mg/kg = 400 mg (100 % of original dose 5 mg/kg), Intravenous,  Once, 1 of 4 cycles Dose modification: 5 mg/kg (original dose 5 mg/kg, Cycle 1) Administration: 400 mg (12/09/2019) bevacizumab-bvzr (ZIRABEV) 400 mg in sodium chloride 0.9 % 100 mL chemo infusion, 5 mg/kg = 400 mg, Intravenous,  Once, 1 of 1 cycle leucovorin 422 mg in sodium chloride 0.9 % 250 mL infusion, 200 mg/m2 = 422 mg (100 % of original dose 200 mg/m2), Intravenous,  Once, 1 of 4 cycles Dose modification: 200 mg/m2 (original dose 200 mg/m2, Cycle 1, Reason: Provider Judgment) Administration: 422 mg (12/09/2019)  for chemotherapy treatment.     PHYSICAL EXAMINATION:  Vitals:   12/13/19 0013 12/13/19 0519  BP: (!) 149/83 (!) 153/95  Pulse: 72 81  Resp: 15 16  Temp: 98 F (36.7 C) 98.2 F (36.8 C)  SpO2: 99% 100%   Filed Weights   12/13/19 0013 12/13/19 0519  Weight: 82.5 kg 83.1 kg    Intake/Output from previous day: 11/11 0701 - 11/12 0700 In: 332.3 [P.O.:100;  I.V.:232.3] Out: 100 [Urine:100]  GENERAL: Chronically ill-appearing, no distress OROPHARYNX:no exudate, no erythema and lips, buccal mucosa, and tongue normal  LUNGS: clear to auscultation and percussion with normal breathing effort HEART: regular rate & rhythm and no murmurs and no lower extremity edema ABDOMEN: Positive bowel sounds, soft, no tenderness with palpation NEURO: alert & oriented x 3 with fluent speech, no focal motor/sensory deficits  LABORATORY DATA:  I have reviewed the data as listed CMP Latest Ref Rng & Units 12/12/2019 12/11/2019 12/09/2019  Glucose 70 - 99 mg/dL 245(H) 225(H) 279(H)  BUN 8 - 23 mg/dL 30(H) 25(H) 21  Creatinine 0.61 - 1.24 mg/dL 1.08 1.10 1.14  Sodium 135 - 145 mmol/L 135 138 139  Potassium 3.5 - 5.1 mmol/L 4.2 4.0 4.1  Chloride 98 - 111 mmol/L 99 100 102  CO2 22 - 32 mmol/L _0 Calcium 8.9 - 10.3 mg/dL 8.5(L) 8.4(L) 8.9  Total Protein 6.5 - 8.1 g/dL 6.7 - 6.6  Total Bilirubin 0.3 - 1.2 mg/dL 0.7 - 0.3  Alkaline Phos 38 - 126 U/L 117 - 154(H)  AST 15 - 41 U/L 22 - 23  ALT 0 - 44 U/L 19 -  16    Lab Results  Component Value Date   WBC 13.1 (H) 12/12/2019   HGB 9.1 (L) 12/12/2019   HCT 29.9 (L) 12/12/2019   MCV 87.4 12/12/2019   PLT 259 12/12/2019   NEUTROABS 12.1 (H) 12/12/2019    CT Angio Chest PE W and/or Wo Contrast  Result Date: 12/11/2019 CLINICAL DATA:  Seven 7-year-old male with concern for pulmonary embolism. Colon cancer. EXAM: CT ANGIOGRAPHY CHEST WITH CONTRAST TECHNIQUE: Multidetector CT imaging of the chest was performed using the standard protocol during bolus administration of intravenous contrast. Multiplanar CT image reconstructions and MIPs were obtained to evaluate the vascular anatomy. CONTRAST:  47mL OMNIPAQUE IOHEXOL 350 MG/ML SOLN COMPARISON:  Chest CT dated 04/28/2019 and radiograph dated 09/10/2019 FINDINGS: Cardiovascular: There is no cardiomegaly or pericardial effusion. There is coronary vascular  calcification and postsurgical changes of CABG. The ascending aorta is enlarged measuring 4.5 cm in diameter similar to prior CT. Evaluation of the aorta is limited due to suboptimal opacification and timing of the contrast. No pulmonary artery embolus identified. Mediastinum/Nodes: No hilar or mediastinal adenopathy. The esophagus and the thyroid gland are grossly unremarkable. No mediastinal fluid collection. Right-sided Port-A-Cath with tip at the cavoatrial junction. Lungs/Pleura: Clusters of ground-glass nodularity in the left lower lobe most concerning for pneumonia versus aspiration. Clinical correlation is recommended. There is a calcified granuloma in the lingula anteriorly. No lobar consolidation, pleural effusion, pneumothorax. The central airways are patent. Small amount of mucus content noted within the distal esophagus and left mainstem bronchus. Upper Abdomen: Multiple hepatic hypodense lesions most consistent with metastatic disease. Multiple gallstones. Partially visualized right renal hypodense lesion, likely a cyst. Musculoskeletal: Osteopenia with degenerative changes of the spine. No acute osseous pathology. Median sternotomy wires. Review of the MIP images confirms the above findings. IMPRESSION: 1. No CT evidence of pulmonary artery embolus. 2. Clusters of ground-glass nodularity in the left lower lobe most concerning for pneumonia versus aspiration. Clinical correlation is recommended. 3. Multiple hepatic metastatic disease. 4. Cholelithiasis. 5. Aortic Atherosclerosis (ICD10-I70.0). Electronically Signed   By: Arash  Radparvar M.D.   On: 12/11/2019 17:02   CT Abdomen Pelvis W Contrast  Result Date: 12/06/2019 CLINICAL DATA:  69-year-old male with history of colon cancer. Restaging examination. Ongoing chemotherapy. EXAM: CT ABDOMEN AND PELVIS WITH CONTRAST TECHNIQUE: Multidetector CT imaging of the abdomen and pelvis was performed using the standard protocol following bolus  administration of intravenous contrast. CONTRAST:  100mL OMNIPAQUE IOHEXOL 300 MG/ML  SOLN COMPARISON:  CT the abdomen and pelvis 09/11/2019. FINDINGS: Lower chest: Left atrial appendage ligation clip. Atherosclerotic calcifications in left anterior descending, left circumflex and right coronary arteries. Postoperative changes of median sternotomy for CABG, including LIMA to the LAD. Calcified granulomas in the anterior aspect of the left upper lobe. Hepatobiliary: Multiple hypovascular hepatic lesions appear increased in number and size compared to the prior examination. The largest of these lesions is in the central aspect of the liver predominantly in segment 4A (axial image 21 of series 2), currently measuring 6.6 x 5.5 cm (previously 5.7 x 3.9 cm). Another prominent lesion in the inferior aspect of the right lobe of the liver between segments 5 and 6 (axial image 33 of series 2), currently 5.4 x 4.0 cm (previously 3.5 x 3.3 cm. Several small new lesions are also noted in segment 2 (axial image 22 of series 2), central aspect of the left lobe of the liver between segments 2 and 3 (axial image 23 of series 2), segment 8 (  axial image 16 of series 2), and segment 7 (axial image 24 of series 2). No intra or extrahepatic biliary ductal dilatation. Tiny calcified gallstones in the neck of the gallbladder. No findings to suggest an acute cholecystitis at this time. Pancreas: 1.4 x 1.0 cm low-attenuation lesion in the head of the pancreas (image 35 of series 2), stable compared to the prior study, too small to characterize. No other pancreatic mass. No pancreatic ductal dilatation. No peripancreatic fluid collections or inflammatory changes. Spleen: Unremarkable. Adrenals/Urinary Tract: Multiple low-attenuation lesions in both kidneys, compatible with simple cysts, largest of which is exophytic in the posterior aspect of the upper pole the right kidney measuring 5.5 x 4.1 cm. Other subcentimeter low-attenuation lesions  in both kidneys are too small to definitively characterize, but statistically likely to represent tiny cysts. Bilateral adrenal glands are normal in appearance. No hydroureteronephrosis. Asymmetric mural thickening of the urinary bladder wall, most severe posteriorly where there also appears to be some increased urothelial enhancement, measuring up to 1.4 cm in thickness (sagittal image 70 of series 6). Stomach/Bowel: The appearance of the stomach is normal. No pathologic dilatation of small bowel or colon. Stent traversing an area of stenosis in the distal sigmoid colon. The appendix is not confidently identified and may be surgically absent. Regardless, there are no inflammatory changes noted adjacent to the cecum to suggest the presence of an acute appendicitis at this time. Vascular/Lymphatic: Mild atherosclerotic disease in the abdominal aorta. No aneurysm or dissection noted in the abdominal or pelvic vasculature. Borderline enlarged peripancreatic lymph node (axial image 34 of series 2), stable compared to prior examination. No other definite pathologically enlarged lymph nodes are noted in the abdomen or pelvis. Reproductive: Prostate gland and seminal vesicles are unremarkable in appearance. Other: No significant volume of ascites.  No pneumoperitoneum. Musculoskeletal: There are no aggressive appearing lytic or blastic lesions noted in the visualized portions of the skeleton. IMPRESSION: 1. Progressive metastatic disease to the liver with increased number and size of numerous hypovascular lesions, as detailed above. 2. No other sites of extra hepatic metastatic disease confidently identified in the abdomen or pelvis. 3. Profound mural thickening in the posterior aspect of the urinary bladder with some urothelial hyperenhancement, increased compared to the prior examination. This may reflect post treatment changes from prior radiation therapy, however, with urologic consultation should be considered for  further clinical evaluation. 4. Small cystic lesion in the head of the pancreas, nonspecific, but stable compared to the prior study and favored to represent a small pseudocyst or other benign lesion. Attention at time of routine follow-up imaging is recommended. 5. Stable position of stent in the sigmoid colon, without evidence of bowel obstruction. 6. Aortic atherosclerosis. 7. Cholelithiasis without evidence of acute cholecystitis at this time. 8. Additional incidental findings, as above. Electronically Signed   By: Vinnie Langton M.D.   On: 12/06/2019 09:10    ASSESSMENT AND PLAN: 1. 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 Colonoscopy 03/07/2019-nearly completely obstructing mass beginning at 16 cm from the anal verge, sigmoid colon polyp ? CTs 03/15/2019-wall thickening of the lower sigmoid colon, low rectal mass with significant luminal narrowing, diffuse liver metastases, borderline enlarged sigmoid mesocolon, iliac, and upper abdominal lymph nodes ? Elevated CEA ? 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 Cycle 1 FOLFOX 04/11/2019 ? Cycle 2 FOLFOX 05/28/2019 ? Cycle 3 FOLFOX 06/11/2019 ? Cycle 4  FOLFOX 06/25/2019 ? CT abdomen/pelvis 07/10/2019-severe obstructing stricture at the sigmoid colon, stable to slight improvement in hepatic metastatic disease compared to February 2021 ? Cycle 5 FOLFOX 07/31/2019 ? Cycle6FOLFOX 08/13/2019 ? Cycle 7 FOLFOX 08/27/2019 ? CT abdomen/pelvis 09/12/2019-stable and slightly decreased hepatic lesions, no evidence of progressive metastatic disease, slight enlargement of a previously noted pancreas head/uncinate lesion, no evidence of colonic obstruction, stable small mesenteric and retroperitoneal nodes ? Cycle8FOLFOX 09/26/2019, oxaliplatin held secondary to persistent orthostatic  hypotension ? Cycle 9 FOLFOX 10/14/2019, oxaliplatin on hold secondary to persistent orthostatic hypotension ? Cycle 10 FOLFOX 10/28/2019, oxaliplatin on hold secondary to persistent orthostatic hypotension ? Cycle 11 FOLFOX 11/11/2019, oxaliplatin on hold secondary to persistent orthostatic hypotension ? Cycle 12 FOLFOX 11/25/2019, oxaliplatin on hold secondary to persistent orthostatic hypotension ? CT abdomen/pelvis 12/05/2019-increased size and number of liver metastases, mural thickening in the posterior bladder with enhancement, stable small cystic lesion in the pancreas head, stable sigmoid stent without evidence of obstruction 2. 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 Diabetes 4. Coronary artery disease, status post coronary artery bypass surgery in 2017 5. Neutropenia following cycle 1 FOLFOX 6. Admission 04/28/2019 with COVID-19 infection 7. C. difficile colitis 04/28/2019 8. Atrial flutter during hospital admission March/April 2021-treated with a Cardizem drip, digoxin, and Eliquis anticoagulation. Converted to metoprolol at discharge.  Admission with recurrent rapid atrial fibrillation/flutter 07/09/2019 9. 07/09/2019-hospital admission for bowel obstruction  Sigmoidoscopy 07/12/2019-completely obstructing mass at 17-20 cm proximal to the anus-oozing present, stent placed 10. Fall 07/24/2019 with acute kidney injury/rhabdomyolysis 11. 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  Florinef 0.1 mg daily beginning 11/11/2019. 13.Right foot drop is likely due to weight loss/nerve compression 14. C. 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  Hospital admission 12/11/2019-hypotension and concern for GI bleed  Clarence Andrews appears improved.  Not hypotensive when laying down, blood pressure dropped  significantly when upright.  Recommend checking orthostatic blood pressures and consideration of cardiology consult for management of hypotension.  Can also consider application of TED stockings to help with hypotension.   He is no longer vomiting and abdominal cramping has resolved.  Symptoms were likely related to Irinotecan administration.    Recommendations: 1.  Recommend cardiology consult for management of hypotension. 2.  Check orthostatic vital signs. 3.  Consider Ted stockings for compression. 4.  Continue as needed antiemetics. 5.  Follow-up at the Cancer center as scheduled  From our standpoint, Clarence Andrews may be discharged when otherwise medically stable.  We will see him as previously scheduled at the cancer center.  Please call oncology over the weekend for questions.   LOS: 0 days   Clarence Bussing, DNP, AGPCNP-BC, AOCNP 12/13/19 Mr. Baisley was interviewed and examined.  Nausea and abdominal pain have resolved.  I suspect his symptoms are related to irinotecan.  He is now at day 5 following cycle 1 of FOLFIRI/Avastin.  He has persistent orthostatic hypotension and should continue midodrine.  I recommend adding support stockings.  He may also benefit from Florinef.  Julieanne Manson, MD
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
## 2019-12-13 NOTE — TOC Progression Note (Signed)
Transition of Care (TOC) - Progression Note    Patient Details  Name: Clarence Andrews. MRN: [id number] Date of Birth: 05-31-1950  Transition of Care [facility]) CM/SW Contact  Purcell Mouton, RN Phone Number: 12/13/2019, 11:44 AM  Clinical Narrative:    Transportation called. RN is aware. [employer] ws called to make aware that PTAR was called.         Expected Discharge Plan and Services           Expected Discharge Date: 12/13/19                                     Social Determinants of Health (SDOH) Interventions    Readmission Risk Interventions No flowsheet data found.

## 2019-12-15 LAB — BPAM RBC
Blood Product Expiration Date: 202112052359
Blood Product Expiration Date: 202112052359
Unit Type and Rh: 7300
Unit Type and Rh: 7300

## 2019-12-15 LAB — TYPE AND SCREEN
ABO/RH(D): B POS
Antibody Screen: NEGATIVE
Unit division: 0
Unit division: 0

## 2019-12-22 ENCOUNTER — Other Ambulatory Visit: Payer: Self-pay | Admitting: Oncology

## 2019-12-23 ENCOUNTER — Inpatient Hospital Stay: Payer: Medicare Other

## 2019-12-23 ENCOUNTER — Other Ambulatory Visit: Payer: Self-pay

## 2019-12-23 ENCOUNTER — Inpatient Hospital Stay: Payer: Medicare Other | Admitting: Nurse Practitioner

## 2019-12-23 ENCOUNTER — Emergency Department (HOSPITAL_COMMUNITY)
Admission: EM | Admit: 2019-12-23 | Discharge: 2019-12-24 | Disposition: A | Discharge status: Home / Self Care | Payer: Medicare Other | Attending: Emergency Medicine | Admitting: Emergency Medicine

## 2019-12-23 ENCOUNTER — Encounter (HOSPITAL_COMMUNITY): Payer: Self-pay

## 2019-12-23 DIAGNOSIS — R531 Weakness: Secondary | ICD-10-CM | POA: Insufficient documentation

## 2019-12-23 DIAGNOSIS — E861 Hypovolemia: Secondary | ICD-10-CM

## 2019-12-23 DIAGNOSIS — I5023 Acute on chronic systolic (congestive) heart failure: Secondary | ICD-10-CM | POA: Insufficient documentation

## 2019-12-23 DIAGNOSIS — E119 Type 2 diabetes mellitus without complications: Secondary | ICD-10-CM | POA: Diagnosis not present

## 2019-12-23 DIAGNOSIS — I110 Hypertensive heart disease with heart failure: Secondary | ICD-10-CM | POA: Diagnosis not present

## 2019-12-23 DIAGNOSIS — I2510 Atherosclerotic heart disease of native coronary artery without angina pectoris: Secondary | ICD-10-CM | POA: Diagnosis not present

## 2019-12-23 DIAGNOSIS — Z85038 Personal history of other malignant neoplasm of large intestine: Secondary | ICD-10-CM | POA: Diagnosis not present

## 2019-12-23 DIAGNOSIS — Z79899 Other long term (current) drug therapy: Secondary | ICD-10-CM | POA: Diagnosis not present

## 2019-12-23 DIAGNOSIS — Z8616 Personal history of COVID-19: Secondary | ICD-10-CM | POA: Insufficient documentation

## 2019-12-23 DIAGNOSIS — I9589 Other hypotension: Secondary | ICD-10-CM

## 2019-12-23 DIAGNOSIS — R42 Dizziness and giddiness: Secondary | ICD-10-CM | POA: Diagnosis not present

## 2019-12-23 DIAGNOSIS — I959 Hypotension, unspecified: Secondary | ICD-10-CM | POA: Diagnosis not present

## 2019-12-23 DIAGNOSIS — Z7984 Long term (current) use of oral hypoglycemic drugs: Secondary | ICD-10-CM | POA: Diagnosis not present

## 2019-12-23 DIAGNOSIS — Z85818 Personal history of malignant neoplasm of other sites of lip, oral cavity, and pharynx: Secondary | ICD-10-CM | POA: Insufficient documentation

## 2019-12-23 DIAGNOSIS — Z951 Presence of aortocoronary bypass graft: Secondary | ICD-10-CM | POA: Insufficient documentation

## 2019-12-23 LAB — DIGOXIN LEVEL: Digoxin Level: 2.1 ng/mL (ref 1.0–2.0)

## 2019-12-23 LAB — CBC
HCT: 27.8 % — ABNORMAL LOW (ref 39.0–52.0)
Hemoglobin: 8.6 g/dL — ABNORMAL LOW (ref 13.0–17.0)
MCH: 26.8 pg (ref 26.0–34.0)
MCHC: 30.9 g/dL (ref 30.0–36.0)
MCV: 86.6 fL (ref 80.0–100.0)
Platelets: 232 10*3/uL (ref 150–400)
RBC: 3.21 MIL/uL — ABNORMAL LOW (ref 4.22–5.81)
RDW: 16 % — ABNORMAL HIGH (ref 11.5–15.5)
WBC: 2.8 10*3/uL — ABNORMAL LOW (ref 4.0–10.5)
nRBC: 0 % (ref 0.0–0.2)

## 2019-12-23 LAB — COMPREHENSIVE METABOLIC PANEL
ALT: 14 U/L (ref 0–44)
AST: 16 U/L (ref 15–41)
Albumin: 3 g/dL — ABNORMAL LOW (ref 3.5–5.0)
Alkaline Phosphatase: 119 U/L (ref 38–126)
Anion gap: 10 (ref 5–15)
BUN: 24 mg/dL — ABNORMAL HIGH (ref 8–23)
CO2: 27 mmol/L (ref 22–32)
Calcium: 8.8 mg/dL — ABNORMAL LOW (ref 8.9–10.3)
Chloride: 101 mmol/L (ref 98–111)
Creatinine, Ser: 1.23 mg/dL (ref 0.61–1.24)
GFR, Estimated: >60 mL/min (ref >60)
Glucose, Bld: 146 mg/dL — ABNORMAL HIGH (ref 70–99)
Potassium: 4.2 mmol/L (ref 3.5–5.1)
Sodium: 138 mmol/L (ref 135–145)
Total Bilirubin: 0.6 mg/dL (ref 0.3–1.2)
Total Protein: 6.5 g/dL (ref 6.5–8.1)

## 2019-12-23 LAB — TYPE AND SCREEN
ABO/RH(D): B POS
Antibody Screen: NEGATIVE

## 2019-12-23 LAB — POC OCCULT BLOOD, ED: Fecal Occult Bld: NEGATIVE

## 2019-12-23 MED ORDER — SODIUM CHLORIDE 0.9 % IV BOLUS
1000.0000 mL | Freq: Once | INTRAVENOUS | Status: AC
  Administered 2019-12-23: 1000 mL via INTRAVENOUS

## 2019-12-23 MED ORDER — HEPARIN SOD (PORK) LOCK FLUSH 100 UNIT/ML IV SOLN
500.0000 [IU] | Freq: Once | INTRAVENOUS | Status: AC
  Administered 2019-12-23: 500 [IU]
  Filled 2019-12-23: qty 5

## 2019-12-23 NOTE — ED Triage Notes (Addendum)
Pt arrived via EMS from SNF, per NH staff, pt BP dropped (70's/50s) while attempting to get pt to chair. Pt denies any dizziness or any issues in triage. States he has had problems with orthostatic hypotension since getting over COVID x7 months ago.
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
## 2019-12-23 NOTE — ED Notes (Signed)
[facility] called to transport patient to [facility]

## 2019-12-23 NOTE — Discharge Instructions (Addendum)
It was our pleasure to provide your ER care today - we hope that you feel better.  Your blood pressure is improved.   Drink plenty of fluids, get adequate nutrition.  From today's labs, your digoxin level is slightly high today - do not take the digoxin for the next day. Follow up with your doctor for recheck this week.  Follow up with your doctor in the coming week.  Return to ER right away if worse, new symptoms, fevers, new or severe pain, trouble breathing, vomiting, weak/fainting, or other concern.

## 2019-12-24 ENCOUNTER — Telehealth: Payer: Self-pay | Admitting: *Deleted

## 2019-12-24 NOTE — Telephone Encounter (Signed)
Transporter called to inquire when his next appointment is at [facility]

## 2019-12-25 ENCOUNTER — Inpatient Hospital Stay: Payer: Medicare Other

## 2020-01-02 ENCOUNTER — Inpatient Hospital Stay (HOSPITAL_COMMUNITY)
Admission: EM | Admit: 2020-01-02 | Discharge: 2020-01-07 | DRG: 178 | Disposition: A | Discharge status: Skilled Nursing Facility | Payer: Medicare Other | Source: Skilled Nursing Facility | Attending: Internal Medicine | Admitting: Internal Medicine

## 2020-01-02 ENCOUNTER — Encounter (HOSPITAL_COMMUNITY): Payer: Self-pay

## 2020-01-02 ENCOUNTER — Other Ambulatory Visit: Payer: Self-pay

## 2020-01-02 ENCOUNTER — Telehealth: Payer: Self-pay | Admitting: *Deleted

## 2020-01-02 DIAGNOSIS — Z951 Presence of aortocoronary bypass graft: Secondary | ICD-10-CM

## 2020-01-02 DIAGNOSIS — L89151 Pressure ulcer of sacral region, stage 1: Secondary | ICD-10-CM | POA: Diagnosis present

## 2020-01-02 DIAGNOSIS — R112 Nausea with vomiting, unspecified: Secondary | ICD-10-CM

## 2020-01-02 DIAGNOSIS — E1141 Type 2 diabetes mellitus with diabetic mononeuropathy: Secondary | ICD-10-CM | POA: Diagnosis present

## 2020-01-02 DIAGNOSIS — R1310 Dysphagia, unspecified: Secondary | ICD-10-CM | POA: Diagnosis present

## 2020-01-02 DIAGNOSIS — Z8616 Personal history of COVID-19: Secondary | ICD-10-CM

## 2020-01-02 DIAGNOSIS — I951 Orthostatic hypotension: Secondary | ICD-10-CM | POA: Diagnosis present

## 2020-01-02 DIAGNOSIS — Z515 Encounter for palliative care: Secondary | ICD-10-CM

## 2020-01-02 DIAGNOSIS — Z66 Do not resuscitate: Secondary | ICD-10-CM | POA: Diagnosis not present

## 2020-01-02 DIAGNOSIS — M21371 Foot drop, right foot: Secondary | ICD-10-CM | POA: Diagnosis present

## 2020-01-02 DIAGNOSIS — R1110 Vomiting, unspecified: Secondary | ICD-10-CM | POA: Diagnosis present

## 2020-01-02 DIAGNOSIS — Z9049 Acquired absence of other specified parts of digestive tract: Secondary | ICD-10-CM

## 2020-01-02 DIAGNOSIS — Z85819 Personal history of malignant neoplasm of unspecified site of lip, oral cavity, and pharynx: Secondary | ICD-10-CM | POA: Diagnosis present

## 2020-01-02 DIAGNOSIS — Z9221 Personal history of antineoplastic chemotherapy: Secondary | ICD-10-CM

## 2020-01-02 DIAGNOSIS — E119 Type 2 diabetes mellitus without complications: Secondary | ICD-10-CM

## 2020-01-02 DIAGNOSIS — I2510 Atherosclerotic heart disease of native coronary artery without angina pectoris: Secondary | ICD-10-CM | POA: Diagnosis present

## 2020-01-02 DIAGNOSIS — I480 Paroxysmal atrial fibrillation: Secondary | ICD-10-CM | POA: Diagnosis present

## 2020-01-02 DIAGNOSIS — I82429 Acute embolism and thrombosis of unspecified iliac vein: Secondary | ICD-10-CM | POA: Diagnosis present

## 2020-01-02 DIAGNOSIS — I82412 Acute embolism and thrombosis of left femoral vein: Secondary | ICD-10-CM | POA: Diagnosis present

## 2020-01-02 DIAGNOSIS — Z7984 Long term (current) use of oral hypoglycemic drugs: Secondary | ICD-10-CM

## 2020-01-02 DIAGNOSIS — R079 Chest pain, unspecified: Secondary | ICD-10-CM

## 2020-01-02 DIAGNOSIS — N3289 Other specified disorders of bladder: Secondary | ICD-10-CM | POA: Diagnosis present

## 2020-01-02 DIAGNOSIS — K5900 Constipation, unspecified: Secondary | ICD-10-CM | POA: Diagnosis present

## 2020-01-02 DIAGNOSIS — M542 Cervicalgia: Secondary | ICD-10-CM | POA: Diagnosis present

## 2020-01-02 DIAGNOSIS — J690 Pneumonitis due to inhalation of food and vomit: Principal | ICD-10-CM | POA: Diagnosis present

## 2020-01-02 DIAGNOSIS — Z20822 Contact with and (suspected) exposure to covid-19: Secondary | ICD-10-CM | POA: Diagnosis present

## 2020-01-02 DIAGNOSIS — I5022 Chronic systolic (congestive) heart failure: Secondary | ICD-10-CM | POA: Diagnosis present

## 2020-01-02 DIAGNOSIS — C186 Malignant neoplasm of descending colon: Secondary | ICD-10-CM | POA: Diagnosis present

## 2020-01-02 DIAGNOSIS — I4892 Unspecified atrial flutter: Secondary | ICD-10-CM | POA: Diagnosis present

## 2020-01-02 DIAGNOSIS — Z85810 Personal history of malignant neoplasm of tongue: Secondary | ICD-10-CM

## 2020-01-02 DIAGNOSIS — Z79899 Other long term (current) drug therapy: Secondary | ICD-10-CM

## 2020-01-02 DIAGNOSIS — C787 Secondary malignant neoplasm of liver and intrahepatic bile duct: Secondary | ICD-10-CM | POA: Diagnosis present

## 2020-01-02 DIAGNOSIS — Z8249 Family history of ischemic heart disease and other diseases of the circulatory system: Secondary | ICD-10-CM

## 2020-01-02 DIAGNOSIS — L8990 Pressure ulcer of unspecified site, unspecified stage: Secondary | ICD-10-CM | POA: Diagnosis present

## 2020-01-02 DIAGNOSIS — C187 Malignant neoplasm of sigmoid colon: Secondary | ICD-10-CM | POA: Diagnosis present

## 2020-01-02 LAB — COMPREHENSIVE METABOLIC PANEL
ALT: 14 U/L (ref 0–44)
AST: 19 U/L (ref 15–41)
Albumin: 2.9 g/dL — ABNORMAL LOW (ref 3.5–5.0)
Alkaline Phosphatase: 111 U/L (ref 38–126)
Anion gap: 15 (ref 5–15)
BUN: 19 mg/dL (ref 8–23)
CO2: 26 mmol/L (ref 22–32)
Calcium: 8.8 mg/dL — ABNORMAL LOW (ref 8.9–10.3)
Chloride: 98 mmol/L (ref 98–111)
Creatinine, Ser: 1.01 mg/dL (ref 0.61–1.24)
GFR, Estimated: >60 mL/min (ref >60)
Glucose, Bld: 224 mg/dL — ABNORMAL HIGH (ref 70–99)
Potassium: 3.5 mmol/L (ref 3.5–5.1)
Sodium: 139 mmol/L (ref 135–145)
Total Bilirubin: 0.8 mg/dL (ref 0.3–1.2)
Total Protein: 6.5 g/dL (ref 6.5–8.1)

## 2020-01-02 LAB — CBC WITH DIFFERENTIAL/PLATELET
Abs Immature Granulocytes: 0.06 10*3/uL (ref 0.00–0.07)
Basophils Absolute: 0 10*3/uL (ref 0.0–0.1)
Basophils Relative: 0 %
Eosinophils Absolute: 0 10*3/uL (ref 0.0–0.5)
Eosinophils Relative: 0 %
HCT: 33.7 % — ABNORMAL LOW (ref 39.0–52.0)
Hemoglobin: 10.3 g/dL — ABNORMAL LOW (ref 13.0–17.0)
Immature Granulocytes: 0 %
Lymphocytes Relative: 8 %
Lymphs Abs: 1.1 10*3/uL (ref 0.7–4.0)
MCH: 26.1 pg (ref 26.0–34.0)
MCHC: 30.6 g/dL (ref 30.0–36.0)
MCV: 85.3 fL (ref 80.0–100.0)
Monocytes Absolute: 0.9 10*3/uL (ref 0.1–1.0)
Monocytes Relative: 6 %
Neutro Abs: 11.9 10*3/uL — ABNORMAL HIGH (ref 1.7–7.7)
Neutrophils Relative %: 86 %
Platelets: 360 10*3/uL (ref 150–400)
RBC: 3.95 MIL/uL — ABNORMAL LOW (ref 4.22–5.81)
RDW: 15.8 % — ABNORMAL HIGH (ref 11.5–15.5)
WBC: 13.9 10*3/uL — ABNORMAL HIGH (ref 4.0–10.5)
nRBC: 0 % (ref 0.0–0.2)

## 2020-01-02 LAB — LIPASE, BLOOD: Lipase: 16 U/L (ref 11–51)

## 2020-01-02 LAB — MONONUCLEOSIS SCREEN: Mono Screen: NEGATIVE

## 2020-01-02 MED ORDER — METOCLOPRAMIDE HCL 5 MG/ML IJ SOLN
10.0000 mg | Freq: Once | INTRAMUSCULAR | Status: AC
  Administered 2020-01-02: 10 mg via INTRAVENOUS
  Filled 2020-01-02: qty 2

## 2020-01-02 MED ORDER — MORPHINE SULFATE (PF) 4 MG/ML IV SOLN
4.0000 mg | Freq: Once | INTRAVENOUS | Status: AC
  Administered 2020-01-02: 4 mg via INTRAVENOUS
  Filled 2020-01-02: qty 1

## 2020-01-02 NOTE — Telephone Encounter (Signed)
Patient left VM reporting "I think my throat cancer is back". Reports his throat is very painful and hurts to swallow. Also says he has growths on his tongue. Can't eat due to pain. Wants referral to ENT.

## 2020-01-02 NOTE — ED Notes (Signed)
Patient asked to give urine sample.
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
## 2020-01-02 NOTE — ED Triage Notes (Signed)
Patient BIB [facility] and is a cancer patient with carcinoma of the colon. He is at a skilled nursing facility, [employer] [facility]development [facility]).Patient vomited at 3pm. Nurses gave him prescribed dose of Pherergan. Patient vomited again and complaining of generalized abdominal pain. Chest port accessed on right side with NS drip hanging. Patients blood sugar 266. Patients has Atrail Fibrillation and Atrial Flutter.

## 2020-01-03 ENCOUNTER — Emergency Department (HOSPITAL_COMMUNITY): Payer: Medicare Other

## 2020-01-03 ENCOUNTER — Encounter (HOSPITAL_COMMUNITY): Payer: Self-pay

## 2020-01-03 ENCOUNTER — Observation Stay (HOSPITAL_COMMUNITY): Payer: Medicare Other

## 2020-01-03 DIAGNOSIS — I82412 Acute embolism and thrombosis of left femoral vein: Secondary | ICD-10-CM | POA: Diagnosis present

## 2020-01-03 DIAGNOSIS — N3289 Other specified disorders of bladder: Secondary | ICD-10-CM | POA: Diagnosis not present

## 2020-01-03 DIAGNOSIS — I951 Orthostatic hypotension: Secondary | ICD-10-CM

## 2020-01-03 DIAGNOSIS — Z66 Do not resuscitate: Secondary | ICD-10-CM | POA: Diagnosis not present

## 2020-01-03 DIAGNOSIS — I480 Paroxysmal atrial fibrillation: Secondary | ICD-10-CM

## 2020-01-03 DIAGNOSIS — C187 Malignant neoplasm of sigmoid colon: Secondary | ICD-10-CM | POA: Diagnosis present

## 2020-01-03 DIAGNOSIS — J690 Pneumonitis due to inhalation of food and vomit: Principal | ICD-10-CM

## 2020-01-03 DIAGNOSIS — R1310 Dysphagia, unspecified: Secondary | ICD-10-CM | POA: Diagnosis present

## 2020-01-03 DIAGNOSIS — E1141 Type 2 diabetes mellitus with diabetic mononeuropathy: Secondary | ICD-10-CM | POA: Diagnosis present

## 2020-01-03 DIAGNOSIS — Z8249 Family history of ischemic heart disease and other diseases of the circulatory system: Secondary | ICD-10-CM | POA: Diagnosis not present

## 2020-01-03 DIAGNOSIS — R112 Nausea with vomiting, unspecified: Secondary | ICD-10-CM | POA: Diagnosis not present

## 2020-01-03 DIAGNOSIS — Z515 Encounter for palliative care: Secondary | ICD-10-CM | POA: Diagnosis not present

## 2020-01-03 DIAGNOSIS — Z85819 Personal history of malignant neoplasm of unspecified site of lip, oral cavity, and pharynx: Secondary | ICD-10-CM

## 2020-01-03 DIAGNOSIS — I2510 Atherosclerotic heart disease of native coronary artery without angina pectoris: Secondary | ICD-10-CM | POA: Diagnosis present

## 2020-01-03 DIAGNOSIS — I82409 Acute embolism and thrombosis of unspecified deep veins of unspecified lower extremity: Secondary | ICD-10-CM | POA: Diagnosis not present

## 2020-01-03 DIAGNOSIS — Z8616 Personal history of COVID-19: Secondary | ICD-10-CM | POA: Diagnosis not present

## 2020-01-03 DIAGNOSIS — R1110 Vomiting, unspecified: Secondary | ICD-10-CM | POA: Diagnosis present

## 2020-01-03 DIAGNOSIS — C186 Malignant neoplasm of descending colon: Secondary | ICD-10-CM | POA: Diagnosis present

## 2020-01-03 DIAGNOSIS — C787 Secondary malignant neoplasm of liver and intrahepatic bile duct: Secondary | ICD-10-CM

## 2020-01-03 DIAGNOSIS — R079 Chest pain, unspecified: Secondary | ICD-10-CM

## 2020-01-03 DIAGNOSIS — Z951 Presence of aortocoronary bypass graft: Secondary | ICD-10-CM | POA: Diagnosis not present

## 2020-01-03 DIAGNOSIS — M542 Cervicalgia: Secondary | ICD-10-CM | POA: Diagnosis present

## 2020-01-03 DIAGNOSIS — I5022 Chronic systolic (congestive) heart failure: Secondary | ICD-10-CM | POA: Diagnosis present

## 2020-01-03 DIAGNOSIS — M21371 Foot drop, right foot: Secondary | ICD-10-CM | POA: Diagnosis present

## 2020-01-03 DIAGNOSIS — L89151 Pressure ulcer of sacral region, stage 1: Secondary | ICD-10-CM | POA: Diagnosis present

## 2020-01-03 DIAGNOSIS — I4892 Unspecified atrial flutter: Secondary | ICD-10-CM | POA: Diagnosis present

## 2020-01-03 DIAGNOSIS — K5900 Constipation, unspecified: Secondary | ICD-10-CM | POA: Diagnosis present

## 2020-01-03 DIAGNOSIS — I82429 Acute embolism and thrombosis of unspecified iliac vein: Secondary | ICD-10-CM | POA: Diagnosis present

## 2020-01-03 DIAGNOSIS — Z20822 Contact with and (suspected) exposure to covid-19: Secondary | ICD-10-CM | POA: Diagnosis present

## 2020-01-03 DIAGNOSIS — Z85810 Personal history of malignant neoplasm of tongue: Secondary | ICD-10-CM | POA: Diagnosis not present

## 2020-01-03 DIAGNOSIS — E119 Type 2 diabetes mellitus without complications: Secondary | ICD-10-CM

## 2020-01-03 HISTORY — DX: Paroxysmal atrial fibrillation: I48.0

## 2020-01-03 HISTORY — DX: Pneumonitis due to inhalation of food and vomit: J69.0

## 2020-01-03 LAB — COMPREHENSIVE METABOLIC PANEL
ALT: 12 U/L (ref 0–44)
AST: 16 U/L (ref 15–41)
Albumin: 2.8 g/dL — ABNORMAL LOW (ref 3.5–5.0)
Alkaline Phosphatase: 99 U/L (ref 38–126)
Anion gap: 12 (ref 5–15)
BUN: 20 mg/dL (ref 8–23)
CO2: 27 mmol/L (ref 22–32)
Calcium: 8.3 mg/dL — ABNORMAL LOW (ref 8.9–10.3)
Chloride: 101 mmol/L (ref 98–111)
Creatinine, Ser: 1.06 mg/dL (ref 0.61–1.24)
GFR, Estimated: >60 mL/min (ref >60)
Glucose, Bld: 267 mg/dL — ABNORMAL HIGH (ref 70–99)
Potassium: 3.8 mmol/L (ref 3.5–5.1)
Sodium: 140 mmol/L (ref 135–145)
Total Bilirubin: 0.6 mg/dL (ref 0.3–1.2)
Total Protein: 6.1 g/dL — ABNORMAL LOW (ref 6.5–8.1)

## 2020-01-03 LAB — CBG MONITORING, ED
Glucose-Capillary: 257 mg/dL — ABNORMAL HIGH (ref 70–99)
Glucose-Capillary: 253 mg/dL — ABNORMAL HIGH (ref 70–99)
Glucose-Capillary: 194 mg/dL — ABNORMAL HIGH (ref 70–99)

## 2020-01-03 LAB — CBC WITH DIFFERENTIAL/PLATELET
Abs Immature Granulocytes: 0.07 10*3/uL (ref 0.00–0.07)
Basophils Absolute: 0 10*3/uL (ref 0.0–0.1)
Basophils Relative: 0 %
Eosinophils Absolute: 0 10*3/uL (ref 0.0–0.5)
Eosinophils Relative: 0 %
HCT: 30.3 % — ABNORMAL LOW (ref 39.0–52.0)
Hemoglobin: 9.2 g/dL — ABNORMAL LOW (ref 13.0–17.0)
Immature Granulocytes: 1 %
Lymphocytes Relative: 5 %
Lymphs Abs: 0.6 10*3/uL — ABNORMAL LOW (ref 0.7–4.0)
MCH: 26.1 pg (ref 26.0–34.0)
MCHC: 30.4 g/dL (ref 30.0–36.0)
MCV: 85.8 fL (ref 80.0–100.0)
Monocytes Absolute: 0.6 10*3/uL (ref 0.1–1.0)
Monocytes Relative: 5 %
Neutro Abs: 12.5 10*3/uL — ABNORMAL HIGH (ref 1.7–7.7)
Neutrophils Relative %: 89 %
Platelets: 314 10*3/uL (ref 150–400)
RBC: 3.53 MIL/uL — ABNORMAL LOW (ref 4.22–5.81)
RDW: 16 % — ABNORMAL HIGH (ref 11.5–15.5)
WBC: 13.9 10*3/uL — ABNORMAL HIGH (ref 4.0–10.5)
nRBC: 0 % (ref 0.0–0.2)

## 2020-01-03 LAB — LACTIC ACID, PLASMA
Lactic Acid, Venous: 2.6 mmol/L (ref 0.5–1.9)
Lactic Acid, Venous: 2.4 mmol/L (ref 0.5–1.9)
Lactic Acid, Venous: 2.6 mmol/L (ref 0.5–1.9)

## 2020-01-03 LAB — HEMOGLOBIN A1C
Hgb A1c MFr Bld: 5.8 % — ABNORMAL HIGH (ref 4.8–5.6)
Mean Plasma Glucose: 119.76 mg/dL

## 2020-01-03 LAB — GLUCOSE, CAPILLARY
Glucose-Capillary: 122 mg/dL — ABNORMAL HIGH (ref 70–99)
Glucose-Capillary: 129 mg/dL — ABNORMAL HIGH (ref 70–99)

## 2020-01-03 LAB — RESP PANEL BY RT-PCR (FLU A&B, COVID) ARPGX2
Influenza A by PCR: NEGATIVE
Influenza B by PCR: NEGATIVE
SARS Coronavirus 2 by RT PCR: NEGATIVE

## 2020-01-03 LAB — MAGNESIUM: Magnesium: 1.4 mg/dL — ABNORMAL LOW (ref 1.7–2.4)

## 2020-01-03 LAB — GROUP A STREP BY PCR: Group A Strep by PCR: NOT DETECTED

## 2020-01-03 MED ORDER — LACTATED RINGERS IV SOLN: INTRAVENOUS | Status: DC

## 2020-01-03 MED ORDER — LORAZEPAM 2 MG/ML IJ SOLN
0.5000 mg | Freq: Once | INTRAMUSCULAR | Status: AC
  Administered 2020-01-03: 0.5 mg via INTRAVENOUS
  Filled 2020-01-03: qty 1

## 2020-01-03 MED ORDER — CHLORHEXIDINE GLUCONATE CLOTH 2 % EX PADS
6.0000 | MEDICATED_PAD | Freq: Every day | CUTANEOUS | Status: DC
  Administered 2020-01-03 – 2020-01-07 (×5): 6 via TOPICAL

## 2020-01-03 MED ORDER — MIDODRINE HCL 5 MG PO TABS
10.0000 mg | ORAL_TABLET | Freq: Three times a day (TID) | ORAL | Status: DC
  Filled 2020-01-03: qty 2

## 2020-01-03 MED ORDER — SODIUM CHLORIDE (PF) 0.9 % IJ SOLN
INTRAMUSCULAR | Status: AC
  Filled 2020-01-03: qty 50

## 2020-01-03 MED ORDER — ENOXAPARIN SODIUM 40 MG/0.4ML ~~LOC~~ SOLN
40.0000 mg | SUBCUTANEOUS | Status: DC
  Administered 2020-01-03: 40 mg via SUBCUTANEOUS
  Filled 2020-01-03: qty 0.4

## 2020-01-03 MED ORDER — INSULIN ASPART 100 UNIT/ML ~~LOC~~ SOLN
0.0000 [IU] | SUBCUTANEOUS | Status: DC
  Administered 2020-01-03: 5 [IU] via SUBCUTANEOUS
  Administered 2020-01-03: 2 [IU] via SUBCUTANEOUS
  Administered 2020-01-03: 1 [IU] via SUBCUTANEOUS
  Administered 2020-01-03: 5 [IU] via SUBCUTANEOUS
  Administered 2020-01-04: 1 [IU] via SUBCUTANEOUS
  Filled 2020-01-03: qty 0.09

## 2020-01-03 MED ORDER — HEPARIN BOLUS VIA INFUSION
2500.0000 [IU] | Freq: Once | INTRAVENOUS | Status: AC
  Administered 2020-01-03: 2500 [IU] via INTRAVENOUS
  Filled 2020-01-03: qty 2500

## 2020-01-03 MED ORDER — SODIUM CHLORIDE 0.9 % IV SOLN
3.0000 g | Freq: Four times a day (QID) | INTRAVENOUS | Status: DC
  Administered 2020-01-03 – 2020-01-06 (×13): 3 g via INTRAVENOUS
  Filled 2020-01-03 (×3): qty 3
  Filled 2020-01-03: qty 8
  Filled 2020-01-03 (×2): qty 3
  Filled 2020-01-03: qty 8
  Filled 2020-01-03: qty 3
  Filled 2020-01-03: qty 8
  Filled 2020-01-03 (×5): qty 3

## 2020-01-03 MED ORDER — MAGNESIUM SULFATE 2 GM/50ML IV SOLN
2.0000 g | Freq: Once | INTRAVENOUS | Status: AC
  Administered 2020-01-03: 2 g via INTRAVENOUS
  Filled 2020-01-03: qty 50

## 2020-01-03 MED ORDER — POLYETHYLENE GLYCOL 3350 17 G PO PACK
17.0000 g | PACK | Freq: Every day | ORAL | Status: DC
  Administered 2020-01-03 – 2020-01-06 (×4): 17 g via ORAL
  Filled 2020-01-03 (×4): qty 1

## 2020-01-03 MED ORDER — PANTOPRAZOLE SODIUM 40 MG IV SOLR
40.0000 mg | INTRAVENOUS | Status: DC
  Administered 2020-01-03 – 2020-01-06 (×4): 40 mg via INTRAVENOUS
  Filled 2020-01-03 (×4): qty 40

## 2020-01-03 MED ORDER — SODIUM CHLORIDE 0.9 % IV BOLUS
1000.0000 mL | Freq: Once | INTRAVENOUS | Status: AC
  Administered 2020-01-03: 1000 mL via INTRAVENOUS

## 2020-01-03 MED ORDER — HEPARIN (PORCINE) 25000 UT/250ML-% IV SOLN
1450.0000 [IU]/h | INTRAVENOUS | Status: DC
  Administered 2020-01-03 – 2020-01-05 (×3): 1450 [IU]/h via INTRAVENOUS
  Filled 2020-01-03 (×4): qty 250

## 2020-01-03 MED ORDER — SODIUM CHLORIDE 0.9 % IV SOLN
12.5000 mg | Freq: Three times a day (TID) | INTRAVENOUS | Status: DC | PRN
  Filled 2020-01-03: qty 0.5

## 2020-01-03 MED ORDER — LACTATED RINGERS IV BOLUS
1000.0000 mL | Freq: Once | INTRAVENOUS | Status: AC
  Administered 2020-01-03: 1000 mL via INTRAVENOUS

## 2020-01-03 MED ORDER — DOCUSATE SODIUM 100 MG PO CAPS
100.0000 mg | ORAL_CAPSULE | Freq: Two times a day (BID) | ORAL | Status: DC
  Administered 2020-01-03 – 2020-01-07 (×5): 100 mg via ORAL
  Filled 2020-01-03 (×8): qty 1

## 2020-01-03 MED ORDER — ONDANSETRON HCL 4 MG/2ML IJ SOLN: 4.0000 mg | Freq: Four times a day (QID) | INTRAMUSCULAR | Status: DC | PRN

## 2020-01-03 MED ORDER — ONDANSETRON HCL 4 MG PO TABS: 4.0000 mg | ORAL_TABLET | Freq: Four times a day (QID) | ORAL | Status: DC | PRN

## 2020-01-03 MED ORDER — ONDANSETRON HCL 4 MG/2ML IJ SOLN
4.0000 mg | Freq: Once | INTRAMUSCULAR | Status: AC
  Administered 2020-01-03: 4 mg via INTRAVENOUS
  Filled 2020-01-03: qty 2

## 2020-01-03 MED ORDER — BISACODYL 10 MG RE SUPP: 10.0000 mg | Freq: Every day | RECTAL | Status: DC | PRN

## 2020-01-03 MED ORDER — IOHEXOL 300 MG/ML  SOLN
100.0000 mL | Freq: Once | INTRAMUSCULAR | Status: AC | PRN
  Administered 2020-01-03: 100 mL via INTRAVENOUS

## 2020-01-03 MED ORDER — DIGOXIN 250 MCG PO TABS
0.2500 mg | ORAL_TABLET | Freq: Every day | ORAL | Status: DC
  Filled 2020-01-03: qty 1

## 2020-01-03 NOTE — Progress Notes (Addendum)
[place] for IV heparin Indication: L DVT  No Known Allergies  Patient Measurements: Height: 6\' 2"  (188 cm) Weight: 83.1 kg (183 lb 3.2 oz) IBW/kg (Calculated) : 82.2 Heparin Dosing Weight: TBW (used [place] for initial dosing)  Vital Signs: Temp: 97.5 F (36.4 C) (12/03 1710) Temp Source: Oral (12/03 1710) BP: 155/95 (12/03 1710) Pulse Rate: 101 (12/03 1710)  Labs: Recent Labs    01/02/20 2215 01/03/20 0803  HGB 10.3* 9.2*  HCT 33.7* 30.3*  PLT 360 314  CREATININE 1.01 1.06    Estimated Creatinine Clearance: 74.3 mL/min (by C-G formula based on SCr of 1.06 mg/dL).   Medical History: Past Medical History:  Diagnosis Date  . CAD (coronary artery disease) 2017   CABG  . Cancer ([place])   . CHF (congestive heart failure) ([employer])   . Colitis   . COVID-19 04/29/2019  . Diabetes ([place])   . Paronychia of second toe of right foot   . Throat cancer ([place])     Medications:  Medications Prior to Admission  Medication Sig Dispense Refill Last Dose  . acetaminophen (TYLENOL) 325 MG tablet Take 650 mg by mouth every 6 (six) hours as needed for mild pain.    unk  . ascorbic acid (VITAMIN C) 500 MG tablet Take 1 tablet (500 mg total) by mouth daily.   Past Week at Unknown time  . calcium carbonate (OSCAL) 1500 (600 Ca) MG TABS tablet Take 1,500 mg by mouth 2 (two) times daily as needed (heartburn).    unk  . Cholecalciferol (D3-1000 PO) Take 1,000 Units by mouth daily.    Past Week at Unknown time  . Chromium-Cinnamon [phone] MCG-MG CAPS Take 2,000 mg by mouth daily.   Past Week at Unknown time  . digoxin (LANOXIN) 0.25 MG tablet Take 1 tablet (0.25 mg total) by mouth daily. 30 tablet 1 Past Week at Unknown time  . ferrous sulfate 325 (65 FE) MG EC tablet Take 325 mg by mouth daily with breakfast.   Past Week at Unknown time  . glipiZIDE (GLUCOTROL) 10 MG tablet Take 10 mg by mouth daily before breakfast.   Past Week at Unknown time  .  hyoscyamine (LEVSIN) 0.125 MG tablet Take 1 tablet (0.125 mg total) by mouth every 4 (four) hours as needed for cramping. 30 tablet 0 unk  . Infant Care Products [facility]) OINT Apply 1 application topically daily as needed (protectant).    unk  . levalbuterol (XOPENEX HFA) 45 MCG/ACT inhaler Inhale 2 puffs into the lungs every 6 (six) hours as needed for wheezing. 1 Inhaler 0 unk  . loperamide (IMODIUM) 2 MG capsule Take 1 capsule by mouth 4 (four) times daily as needed for diarrhea or loose stools.    unk  . magnesium oxide (MAG-OX) 400 MG tablet Take 800 mg by mouth daily.    Past Week at Unknown time  . midodrine (PROAMATINE) 10 MG tablet Take 1 tablet (10 mg total) by mouth 3 (three) times daily with meals.   Past Week at Unknown time  . Multiple Vitamin (MULTIVITAMIN WITH MINERALS) TABS tablet Take 1 tablet by mouth daily.   Past Week at Unknown time  . Multiple Vitamins-Minerals (DECUBI-VITE) CAPS Take 1 capsule by mouth daily.   Past Week at Unknown time  . omeprazole (PRILOSEC) 20 MG capsule Take 1 capsule (20 mg total) by mouth 2 (two) times daily. 60 capsule 1 Past Week at Unknown time  . PROPYLENE GLYCOL OP Place 1  drop into both eyes as needed (dry eyes).    unk  . Turmeric 500 MG CAPS Take 1,000 mg by mouth 2 (two) times daily.    Past Week at Unknown time  . vitamin B-12 (CYANOCOBALAMIN) 500 MCG tablet Take 500 mcg by mouth daily.   Past Week at Unknown time   Scheduled:  . Chlorhexidine Gluconate Cloth  6 each Topical Daily  . docusate sodium  100 mg Oral BID  . heparin  2,500 Units Intravenous Once  . insulin aspart  0-9 Units Subcutaneous Q4H  . pantoprazole (PROTONIX) IV  40 mg Intravenous Q24H  . polyethylene glycol  17 g Oral Daily   Infusions:  . ampicillin-sulbactam (UNASYN) IV 3 g (01/03/20 1749)  . chlorproMAZINE (THORAZINE) IV    . heparin    . 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 CTa/p suggestive of DVT, and vascular duplex reveals L DVT. Pharmacy consulted to dose heparin IV.  During 12/11/19 admission, noted to have "one episode of emesis that looked like coffee-ground. FOBT positive. Hemoglobin at baseline. Recent upper GI endoscopy was normal. He is not on any PPI. Started on PPI. Currently no indication for endoscopic evaluation."   Baseline INR, aPTT: not done  Prior anticoagulation: none d/t recent hematemesis; enoxaparin 40 mg at ~10a today  Significant events:  Today, 01/03/2020:  CBC: Hgb low in setting of CA and recent FOLFIRI in November  SCr stable WNL (at baseline)  No bleeding or infusion issues per nursing  Goal of Therapy: Heparin level 0.3-0.7 units/ml Monitor platelets by anticoagulation protocol: Yes  Plan:  Heparin 2500 units IV bolus x 1  Heparin 1450 units/hr IV infusion  Check heparin level 8 hrs after start  Daily CBC, daily heparin level once stable  Monitor for signs of bleeding or worsening thrombosis  Reuel Boom, PharmD, BCPS [phone] 01/03/2020, 6:27 PM

## 2020-01-03 NOTE — Progress Notes (Signed)
Lower extremity venous has been completed.   Preliminary results in CV Proc.   Abram Sander 01/03/2020 4:38 PM

## 2020-01-03 NOTE — ED Notes (Addendum)
Date and time results received: 01/03/20 0926   Test: lactic acid   Critical Value: 2.6  Name of Provider Notified: Lavina Hamman, MD   Orders Received? Or Actions Taken?: Acknowledged

## 2020-01-03 NOTE — Progress Notes (Signed)
Patient is on clear liquid diet. Per MD Berle Mull ok to give patient grape juice.

## 2020-01-03 NOTE — ED Notes (Addendum)
Placed Male condom catheter on patient Changed gown Changed brief Warm blankets Repositioned patient Turned on TV to [employer] for patients entertainment

## 2020-01-03 NOTE — Progress Notes (Signed)
IP PROGRESS NOTE  Subjective:   Clarence Andrews is well-known to me with a history of metastatic colon cancer.  He missed the last scheduled appointment at the Cancer center.  He presented today with nausea and vomiting.  He reports decreased oral intake and a "sore throat "for the past week.  He has intermittent diarrhea.  He has received antiemetics in the emergency room.  No nausea at present.  Objective: Vital signs in last 24 hours: Blood pressure (!) 159/95, pulse 88, temperature (!) 97.5 F (36.4 C), temperature source Oral, resp. rate 15, height _0  (1.88 m), weight 183 lb 3.2 oz (83.1 kg), SpO2 97 %.  Intake/Output from previous day: 12/02 0701 - 12/03 0700 In: 1100 [IV Piggyback:1100] Out: -   Physical Exam:  HEENT: Pharynx without erythema, exudate, or ulcers Lungs: Clear anteriorly, no respiratory distress Cardiac: Irregular Abdomen: Nontender, no mass, no hepatosplenomegaly Extremities: No leg edema Neurologic: Lethargic, arousable, follows commands, appears oriented  Portacath/PICC-without erythema  Lab Results: Recent Labs    01/02/20 2215 01/03/20 0803  WBC 13.9* 13.9*  HGB 10.3* 9.2*  HCT 33.7* 30.3*  PLT 360 314    BMET Recent Labs    01/02/20 2215 01/03/20 0803  NA 139 140  K 3.5 3.8  CL 98 101  CO2 26 27  GLUCOSE 224* 267*  BUN 19 20  CREATININE 1.01 1.06  CALCIUM 8.8* 8.3*    Lab Results  Component Value Date   CEA1 174.72 (H) 11/25/2019    Studies/Results: CT Soft Tissue Neck W Contrast  Result Date: 01/03/2020 CLINICAL DATA:  Lymphadenopathy.  Colonic carcinoma. EXAM: CT NECK WITH CONTRAST TECHNIQUE: Multidetector CT imaging of the neck was performed using the standard protocol following the bolus administration of intravenous contrast. CONTRAST:  161m OMNIPAQUE IOHEXOL 300 MG/ML  SOLN COMPARISON:  None. FINDINGS: PHARYNX AND LARYNX: The nasopharynx, oropharynx and larynx are normal. Visible portions of the oral cavity, tongue  base and floor of mouth are normal. Normal epiglottis, vallecula and pyriform sinuses. The larynx is normal. No retropharyngeal abscess, effusion or lymphadenopathy. SALIVARY GLANDS: Parotid glands are normal. Nonvisualized submandibular glands. THYROID: Normal. LYMPH NODES: No enlarged or abnormal density lymph nodes. VASCULAR: There is predominantly noncalcified atherosclerosis of both carotid systems without high-grade stenosis. LIMITED INTRACRANIAL: Normal. VISUALIZED ORBITS: Normal. MASTOIDS AND VISUALIZED PARANASAL SINUSES: No fluid levels or advanced mucosal thickening. No mastoid effusion. SKELETON: No bony spinal canal stenosis. No lytic or blastic lesions. UPPER CHEST: Clear. OTHER: None. IMPRESSION: 1. No cervical lymphadenopathy. 2. Bilateral predominantly noncalcified carotid bifurcation atherosclerosis without high-grade stenosis. Electronically Signed   By: KUlyses JarredM.D.   On: 01/03/2020 02:49   CT ABDOMEN PELVIS W CONTRAST  Result Date: 01/03/2020 CLINICAL DATA:  Nausea, vomiting EXAM: CT ABDOMEN AND PELVIS WITH CONTRAST TECHNIQUE: Multidetector CT imaging of the abdomen and pelvis was performed using the standard protocol following bolus administration of intravenous contrast. CONTRAST:  1032mOMNIPAQUE IOHEXOL 300 MG/ML  SOLN COMPARISON:  12/05/2019 FINDINGS: Lower chest: Minimal nodular infiltrate has developed within the a posterior basal segments of the lower lobes bilaterally, possibly reflecting changes related to aspiration, less likely atypical infection. Cardiac size within normal limits. Mild coronary artery calcification noted. Median sternotomy has been performed. Hepatobiliary: Multiple hepatic metastases are again identified and appear grossly stable since prior examination. No intra or extrahepatic biliary ductal dilation. Cholelithiasis again noted. Pancreas: Cystic lesion previously noted within the pancreatic head is not well appreciated on the current examination. The  pancreas  is mildly atrophic, but is otherwise unremarkable. Spleen: Unremarkable Adrenals/Urinary Tract: The adrenal glands are unremarkable. The kidneys are normal in size and position. Multiple simple cortical cysts are seen bilaterally. 3 mm nonobstructing calculus is seen within the lower pole of the left kidney. No hydronephrosis. No solid intrarenal masses. No ureteral calculi. Eccentric bladder wall thickening involving the a posterior wall the bladder is again noted likely reflecting an underlying urothelial carcinoma. There is high attenuation material within the bladder lumen which likely reflect excreted urinary contrast. Stomach/Bowel: The stomach is fluid-filled. No evidence of gastric outlet obstruction, however. Small bowel is unremarkable. Appendix normal. Palliative stent noted within the a distal sigmoid colon, unchanged from prior examination. Moderate stool seen upstream from the colonic stent though the caliber of the large bowel is within normal limits. Moderate stool within the rectal vault. No free intraperitoneal gas or fluid. Vascular/Lymphatic: There is central hypodensity within the left common femoral vein suspicious for a a deep venous thrombus in this location. The abdominal vasculature is otherwise unremarkable. No pathologic adenopathy within the abdomen and pelvis. Reproductive: Prostate is unremarkable. Other: Rectum unremarkable Musculoskeletal: Degenerative changes are seen within the lumbar spine. No lytic or blastic bone lesions are seen. IMPRESSION: Trace inflammatory changes within the visualized lung bases likely related to aspiration. Fluid-filled stomach and given history of vomiting with support this assertion. Numerous hepatic metastases in keeping with metastatic colon cancer, unchanged from prior examination. Colonic stent noted within the sigmoid colon with moderate stool noted upstream throughout the colon without frank dilation to suggest complete obstruction.  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 Cholelithiasis without CT evidence of acute cholecystitis. Known pancreatic cystic lesion is not well visualized on this examination. Aortic Atherosclerosis (ICD10-I70.0). Electronically Signed   By: Fidela Salisbury MD   On: 01/03/2020 03:08   DG Chest Portable 1 View  Result Date: 01/03/2020 CLINICAL DATA:  Cough, fever, vomiting EXAM: PORTABLE CHEST 1 VIEW COMPARISON:  09/10/2019 FINDINGS: Lungs are clear. No pneumothorax or pleural effusion. Right internal jugular chest port tip seen within the right atrium. Coronary artery bypass grafting has been performed. Cardiac size within normal limits. Pulmonary vascularity is normal. No acute bone abnormality. IMPRESSION: And Electronically Signed   By: Fidela Salisbury MD   On: 01/03/2020 00:24    Medications: I have reviewed the patient's current medications.  Assessment/Plan:  1. 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 Colonoscopy 03/07/2019-nearly completely obstructing mass beginning at 16 cm from the anal verge, sigmoid colon polyp ? CTs 03/15/2019-wall thickening of the lower sigmoid colon, low rectal mass with significant luminal narrowing, diffuse liver metastases, borderline enlarged sigmoid mesocolon, iliac, and upper abdominal lymph nodes ? Elevated CEA ? 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 Cycle 1 FOLFOX 04/11/2019 ? Cycle 2 FOLFOX 05/28/2019 ? Cycle 3 FOLFOX 06/11/2019 ? Cycle 4 FOLFOX 06/25/2019 ? CT abdomen/pelvis 07/10/2019-severe obstructing stricture at the sigmoid colon, stable to slight improvement in hepatic metastatic disease compared to February 2021 ? Cycle 5 FOLFOX 07/31/2019 ? Cycle6FOLFOX 08/13/2019 ? Cycle 7 FOLFOX  08/27/2019 ? CT abdomen/pelvis 09/12/2019-stable and slightly decreased hepatic lesions, no evidence of progressive metastatic disease, slight enlargement of a previously noted pancreas head/uncinate lesion, no evidence of colonic obstruction, stable small mesenteric and retroperitoneal nodes ? Cycle8FOLFOX 09/26/2019, oxaliplatin held secondary to persistent orthostatic hypotension ?  Cycle 9 FOLFOX 10/14/2019, oxaliplatin on hold secondary to persistent orthostatic hypotension ? Cycle 10 FOLFOX 10/28/2019, oxaliplatin on hold secondary to persistent orthostatic hypotension ? Cycle 11 FOLFOX 11/11/2019, oxaliplatin on hold secondary to persistent orthostatic hypotension ? Cycle 12 FOLFOX 11/25/2019, oxaliplatin on hold secondary to persistent orthostatic hypotension ? CT abdomen/pelvis 12/05/2019-increased size and number of liver metastases, mural thickening in the posterior bladder with enhancement, stable small cystic lesion in the pancreas head, stable sigmoid stent without evidence of obstruction ? Cycle 1 FOLFIRI 12/09/2019 2. 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 Diabetes 4. Coronary artery disease, status post coronary artery bypass surgery in 2017 5. Neutropenia following cycle 1 FOLFOX 6. Admission 04/28/2019 with COVID-19 infection 7. C. difficile colitis 04/28/2019 8. Atrial flutter during hospital admission March/April 2021-treated with a Cardizem drip, digoxin, and Eliquis anticoagulation. Converted to metoprolol at discharge.  Admission with recurrent rapid atrial fibrillation/flutter 07/09/2019 9. 07/09/2019-hospital admission for bowel obstruction  Sigmoidoscopy 07/12/2019-completely obstructing mass at 17-20 cm proximal to the anus-oozing present, stent placed 10. Fall 07/24/2019 with acute kidney injury/rhabdomyolysis 11. Anemia secondary to chronic disease, malnutrition,  phlebotomy, GI bleeding 12.Orthostatic hypotension-on midodrine 3 times daily. Florinef 0.1 mg daily beginning 11/11/2019. 13.Right foot drop is likely due to weight loss/nerve compression 14. C. 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  Hospital admission 12/11/2019-hypotension and concern for GI bleed 16.  Presentation the emergency room with nausea/vomiting, "sore throat ", and diarrhea 01/03/2020 17.  Possible left femoral DVT on CT 01/03/2020  Clarence Andrews has metastatic colon cancer.  He has completed 1 cycle of second line treatment with FOLFIRI.  He is admitted with nausea and vomiting of unclear etiology.  No nausea at present. Clarence Andrews has a poor performance status.  I do not feel he is a candidate for further chemotherapy. I had a preliminary discussion regarding hospice care with Clarence Andrews.  Recommendations: 1.  Continue supportive care measures 2.  Evaluate left lower extremity for DVT 3.  Please call Oncology as needed over the weekend.  I will check on him 01/06/2020.   LOS: 0 days   Betsy Coder, MD   01/03/2020, 3:56 PM

## 2020-01-03 NOTE — Progress Notes (Signed)
[facility] Progress Note  Patient: Clarence Andrews.    HKV:[id number]  DOA: 01/02/2020     Date of Service: the patient was seen and examined on 01/03/2020  Brief hospital course: History of head and neck cancer, colorectal cancer with metastasis, CAD, type II DM, C. difficile colitis, PAF.  Presents with complaints of nausea and vomiting found to have constipation with severe electrolyte imbalance.  Also had DVT.  CT scan also shows progression of the cancer with worsening metastasis. Currently plan is treat constipation and DVT and continue to engage with family regarding goals of care.  Assessment and Plan: 1.  Intractable nausea and vomiting likely from constipation. CT abdomen negative for any obstruction. Currently passing gas.  No nausea no vomiting since this whole holiday. Advance to clear liquid diet. We'll monitor response. Also treat constipation with bowel regimen.  Monitor.  2.  Colon cancer with liver metastasis Progressive despite 12 cycles of chemotherapy. Also has stent placement. Discussed with patient regarding poor prognosis. Palliative care consulted. Oncology will be following as well.  3.  Aspiration pneumonitis versus aspiration pneumonia Currently not hypoxic. Bilateral crackles. On IV Unasyn. Follow speech evaluation. Monitor.  4.  Diabetes mellitus On sensitive sliding scale. Hold medication.  5.  History of orthostatic hypotension. Currently blood pressure rather elevated.  Will hold midodrine.  6.  Paroxysmal A. fib. Resume digoxin. Not on any anticoagulation.  7.  Age indeterminant DVT. CT scan was positive for common iliac DVT. Doppler positive for left common femoral DVT. Initiating heparin. Discussed with patient that he is at risk for bleeding secondary to his colon cancer history.  8.  Sacral stage I pressure ulcer. POA. Monitor.  Body mass index is 23.52 kg/m.    Interventions:    Pressure Injury 01/03/20 Sacrum  Left Stage 1 -  Intact skin with non-blanchable redness of a localized area usually over a bony prominence. (Active)  01/03/20 1718  Location: Sacrum  Location Orientation: Left  Staging: Stage 1 -  Intact skin with non-blanchable redness of a localized area usually over a bony prominence.  Wound Description (Comments):   Present on Admission: Yes     Diet: Clear liquid diet DVT Prophylaxis: Therapeutic anticoagulation with heparin heparin bolus via infusion 2,500 Units Start: 01/03/20 1915    Advance goals of care discussion: Full code  Family Communication: no family was present at bedside, at the time of interview.   Disposition:  Status is: Inpatient  Remains inpatient appropriate because:IV treatments appropriate due to intensity of illness or inability to take PO   Dispo: The patient is from: Home              Anticipated d/c is to: to be determined               Anticipated d/c date is: 3 days              Patient currently is not medically stable to d/c.  Subjective: No nausea no vomiting as of right now.  No fever no chills.  Some cough.  No headache.  Physical Exam:  General: Appear in mild distress, no Rash; Oral Mucosa Clear, moist. no Abnormal Neck Mass Or lumps, Conjunctiva normal  Cardiovascular: S1 and S2 Present, no Murmur, Respiratory: good respiratory effort, Bilateral Air entry present and bll Crackles, no wheezes Abdomen: Bowel Sound present, Soft and no tenderness Extremities: no Pedal edema Neurology: alert and oriented to time, place, and person affect appropriate. no new focal  deficit Gait not checked due to patient safety concerns  Vitals:   01/03/20 1600 01/03/20 1615 01/03/20 1625 01/03/20 1710  BP: (!) 165/93  (!) 167/103 (!) 155/95  Pulse: 92 95 95 (!) 101  Resp: 15 16 18 17   Temp:   (!) 97.5 F (36.4 C) (!) 97.5 F (36.4 C)  TempSrc:   Oral Oral  SpO2: 100% 99% 100% 100%  Weight:      Height:        Intake/Output Summary (Last 24  hours) at 01/03/2020 1850 Last data filed at 01/03/2020 3557 Gross per 24 hour  Intake 1100 ml  Output --  Net 1100 ml   Filed Weights   01/02/20 2142  Weight: 83.1 kg    Data Reviewed: I have personally reviewed and interpreted daily labs, tele strips, imagings as discussed above. I reviewed all nursing notes, pharmacy notes, vitals, pertinent old records I have discussed plan of care as described above with RN and patient/family.  CBC: Recent Labs  Lab 01/02/20 2215 01/03/20 0803  WBC 13.9* 13.9*  NEUTROABS 11.9* 12.5*  HGB 10.3* 9.2*  HCT 33.7* 30.3*  MCV 85.3 85.8  PLT 360 322   Basic Metabolic Panel: Recent Labs  Lab 01/02/20 2215 01/03/20 0803  NA 139 140  K 3.5 3.8  CL 98 101  CO2 26 27  GLUCOSE 224* 267*  BUN 19 20  CREATININE 1.01 1.06  CALCIUM 8.8* 8.3*  MG  --  1.4*    Studies: CT Soft Tissue Neck W Contrast  Result Date: 01/03/2020 CLINICAL DATA:  Lymphadenopathy.  Colonic carcinoma. EXAM: CT NECK WITH CONTRAST TECHNIQUE: Multidetector CT imaging of the neck was performed using the standard protocol following the bolus administration of intravenous contrast. CONTRAST:  155mL OMNIPAQUE IOHEXOL 300 MG/ML  SOLN COMPARISON:  None. FINDINGS: PHARYNX AND LARYNX: The nasopharynx, oropharynx and larynx are normal. Visible portions of the oral cavity, tongue base and floor of mouth are normal. Normal epiglottis, vallecula and pyriform sinuses. The larynx is normal. No retropharyngeal abscess, effusion or lymphadenopathy. SALIVARY GLANDS: Parotid glands are normal. Nonvisualized submandibular glands. THYROID: Normal. LYMPH NODES: No enlarged or abnormal density lymph nodes. VASCULAR: There is predominantly noncalcified atherosclerosis of both carotid systems without high-grade stenosis. LIMITED INTRACRANIAL: Normal. VISUALIZED ORBITS: Normal. MASTOIDS AND VISUALIZED PARANASAL SINUSES: No fluid levels or advanced mucosal thickening. No mastoid effusion. SKELETON: No  bony spinal canal stenosis. No lytic or blastic lesions. UPPER CHEST: Clear. OTHER: None. IMPRESSION: 1. No cervical lymphadenopathy. 2. Bilateral predominantly noncalcified carotid bifurcation atherosclerosis without high-grade stenosis. Electronically Signed   By: Ulyses Jarred M.D.   On: 01/03/2020 02:49   CT ABDOMEN PELVIS W CONTRAST  Result Date: 01/03/2020 CLINICAL DATA:  Nausea, vomiting EXAM: CT ABDOMEN AND PELVIS WITH CONTRAST TECHNIQUE: Multidetector CT imaging of the abdomen and pelvis was performed using the standard protocol following bolus administration of intravenous contrast. CONTRAST:  139mL OMNIPAQUE IOHEXOL 300 MG/ML  SOLN COMPARISON:  12/05/2019 FINDINGS: Lower chest: Minimal nodular infiltrate has developed within the a posterior basal segments of the lower lobes bilaterally, possibly reflecting changes related to aspiration, less likely atypical infection. Cardiac size within normal limits. Mild coronary artery calcification noted. Median sternotomy has been performed. Hepatobiliary: Multiple hepatic metastases are again identified and appear grossly stable since prior examination. No intra or extrahepatic biliary ductal dilation. Cholelithiasis again noted. Pancreas: Cystic lesion previously noted within the pancreatic head is not well appreciated on the current examination. The pancreas is mildly atrophic, but  is otherwise unremarkable. Spleen: Unremarkable Adrenals/Urinary Tract: The adrenal glands are unremarkable. The kidneys are normal in size and position. Multiple simple cortical cysts are seen bilaterally. 3 mm nonobstructing calculus is seen within the lower pole of the left kidney. No hydronephrosis. No solid intrarenal masses. No ureteral calculi. Eccentric bladder wall thickening involving the a posterior wall the bladder is again noted likely reflecting an underlying urothelial carcinoma. There is high attenuation material within the bladder lumen which likely reflect  excreted urinary contrast. Stomach/Bowel: The stomach is fluid-filled. No evidence of gastric outlet obstruction, however. Small bowel is unremarkable. Appendix normal. Palliative stent noted within the a distal sigmoid colon, unchanged from prior examination. Moderate stool seen upstream from the colonic stent though the caliber of the large bowel is within normal limits. Moderate stool within the rectal vault. No free intraperitoneal gas or fluid. Vascular/Lymphatic: There is central hypodensity within the left common femoral vein suspicious for a a deep venous thrombus in this location. The abdominal vasculature is otherwise unremarkable. No pathologic adenopathy within the abdomen and pelvis. Reproductive: Prostate is unremarkable. Other: Rectum unremarkable Musculoskeletal: Degenerative changes are seen within the lumbar spine. No lytic or blastic bone lesions are seen. IMPRESSION: Trace inflammatory changes within the visualized lung bases likely related to aspiration. Fluid-filled stomach and given history of vomiting with support this assertion. Numerous hepatic metastases in keeping with metastatic colon cancer, unchanged from prior examination. Colonic stent noted within the sigmoid colon with moderate stool noted upstream throughout the colon without frank dilation to suggest complete obstruction. 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 Cholelithiasis without CT evidence of acute cholecystitis. Known pancreatic cystic lesion is not well visualized on this examination. Aortic Atherosclerosis (ICD10-I70.0). Electronically Signed   By: Fidela Salisbury MD   On: 01/03/2020 03:08   DG Chest Portable 1 View  Result Date: 01/03/2020 CLINICAL DATA:  Cough, fever, vomiting EXAM: PORTABLE CHEST 1 VIEW COMPARISON:  09/10/2019 FINDINGS: Lungs are clear. No pneumothorax or pleural effusion. Right internal jugular chest port tip seen  within the right atrium. Coronary artery bypass grafting has been performed. Cardiac size within normal limits. Pulmonary vascularity is normal. No acute bone abnormality. IMPRESSION: And Electronically Signed   By: Fidela Salisbury MD   On: 01/03/2020 00:24   VAS [place] LOWER EXTREMITY VENOUS (DVT)  Result Date: 01/03/2020  Lower Venous DVT Study Indications: Dvt seen on ct.  Comparison Study: previous done 07/26/19 Performing Technologist: Abram Sander RVS  Examination Guidelines: A complete evaluation includes B-mode imaging, spectral Doppler, color Doppler, and power Doppler as needed of all accessible portions of each vessel. Bilateral testing is considered an integral part of a complete examination. Limited examinations for reoccurring indications may be performed as noted. The reflux portion of the exam is performed with the patient in reverse Trendelenburg.  +---------+---------------+---------+-----------+----------+-------------------+  RIGHT     Compressibility Phasicity Spontaneity Properties Thrombus Aging       +---------+---------------+---------+-----------+----------+-------------------+  CFV       Full            Yes       Yes                                         +---------+---------------+---------+-----------+----------+-------------------+  SFJ       Full                                                                  +---------+---------------+---------+-----------+----------+-------------------+  FV Prox   Full                                                                  +---------+---------------+---------+-----------+----------+-------------------+  FV Mid    Full                                                                  +---------+---------------+---------+-----------+----------+-------------------+  FV Distal Full                                                                  +---------+---------------+---------+-----------+----------+-------------------+  PFV       Full                                                                   +---------+---------------+---------+-----------+----------+-------------------+  POP       Full            Yes       Yes                                         +---------+---------------+---------+-----------+----------+-------------------+  PTV       Full                                                                  +---------+---------------+---------+-----------+----------+-------------------+  PERO                                                       Not well visualized  +---------+---------------+---------+-----------+----------+-------------------+   +---------+---------------+---------+-----------+----------+-------------------+  LEFT      Compressibility Phasicity Spontaneity Properties Thrombus Aging       +---------+---------------+---------+-----------+----------+-------------------+  CFV       None            No        No                     Age Indeterminate    +---------+---------------+---------+-----------+----------+-------------------+  SFJ       Full                                                                  +---------+---------------+---------+-----------+----------+-------------------+  FV Prox   Partial                                          Age Indeterminate    +---------+---------------+---------+-----------+----------+-------------------+  FV Mid    Partial                                          Age Indeterminate    +---------+---------------+---------+-----------+----------+-------------------+  FV Distal Partial                                          Age Indeterminate    +---------+---------------+---------+-----------+----------+-------------------+  PFV       Partial                                          Age Indeterminate    +---------+---------------+---------+-----------+----------+-------------------+  POP       Partial         Yes       Yes                    Age Indeterminate     +---------+---------------+---------+-----------+----------+-------------------+  PTV       Full                                                                  +---------+---------------+---------+-----------+----------+-------------------+  PERO                                                       Not well visualized  +---------+---------------+---------+-----------+----------+-------------------+    Summary: RIGHT: - There is no evidence of deep vein thrombosis in the lower extremity.  - No cystic structure found in the popliteal fossa.  LEFT: - Findings consistent with age indeterminate deep vein thrombosis involving the left common femoral vein, left femoral vein, left proximal profunda vein, and left popliteal vein. - No cystic structure found in the popliteal fossa.  *See table(s) above for measurements and observations.    Preliminary     Scheduled Meds:  Chlorhexidine Gluconate Cloth  6 each Topical Daily   docusate sodium  100 mg Oral BID   heparin  2,500 Units Intravenous Once   insulin aspart  0-9 Units Subcutaneous Q4H   pantoprazole (PROTONIX) IV  40 mg Intravenous Q24H   polyethylene glycol  17 g Oral Daily   Continuous Infusions:  ampicillin-sulbactam (UNASYN) IV 3 g (01/03/20 1749)   chlorproMAZINE (THORAZINE) IV     heparin     lactated ringers 100 mL/hr at 01/03/20 0542   PRN Meds: bisacodyl, chlorproMAZINE (THORAZINE) IV, ondansetron **OR** ondansetron (ZOFRAN) IV  Time spent: 35 minutes  Author: Berle Mull, MD [facility] 01/03/2020 6:50 PM  To  reach On-call, see care teams to locate the attending and reach out via [email or web address]. Between 7PM-7AM, please contact night-coverage If you still have difficulty reaching the attending provider, please page the [facility] (Director on Call) for [facility] on amion for assistance.

## 2020-01-03 NOTE — ED Notes (Signed)
Asked patient to use the bathroom again. Still has not provided urine sample.

## 2020-01-03 NOTE — Progress Notes (Signed)
Pharmacy Antibiotic Note  Clarence Andrews. is a 69 y.o. male admitted on 01/02/2020 with aspiration pna. Pt has hx of carcinoma of the colon. Pharmacy has been consulted for unasyn dosing.  Plan: unasyn 3gm IV q6h Follow renal function and clinical course  Height: 6\' 2"  (188 cm) Weight: 83.1 kg (183 lb 3.2 oz) IBW/kg (Calculated) : 82.2  Temp (24hrs), Avg:97.5 F (36.4 C), Min:97.5 F (36.4 C), Max:97.5 F (36.4 C)  Recent Labs  Lab 01/02/20 2215 01/03/20 0104  WBC 13.9*  --   CREATININE 1.01  --   LATICACIDVEN  --  2.6*    Estimated Creatinine Clearance: 78 mL/min (by C-G formula based on SCr of 1.01 mg/dL).    No Known Allergies    Thank you for allowing pharmacy to be a part of this patient's care.  Dolly Rias RPh 01/03/2020, 3:35 AM

## 2020-01-03 NOTE — H&P (Addendum)
History and Physical    Clarence Andrews. DJM:[id number] DOB: January 14, 1951 DOA: 01/02/2020  PCP: Patient, No Pcp Per  Patient coming from: Home  I have personally briefly reviewed patient's old medical records in [place]  Chief Complaint: N/V  HPI: Clarence Andrews. is a 69 y.o. male with medical history significant of DM2, orthostatic hypotension, PAF.  Throat CA in remission, Metastatic colon CA with mets to liver, s/p palliative stent to colon, 12 cycles of FOLFOX and the cancer progressed further based on CT earlier this month.  Patient presents to the ED with c/o 5 episodes of N/V today.  Also has diarrhea ongoing for weeks.  Emesis is NBNB.  No abd pain he reports.  Also has L sided neck pain, sore throat, and dysphagia for past few weeks.  Sharp and stabbing pain.  Worse with eating and drinking.  Has some lesions on tongue, concerned his throat CA may be coming back.   ED Course: CT head and neck: no lymphadenopathy or other acute findings.  CT abd/pelvis: 1) colon stent in place, no obvious obstruction 2) liver mets 3) mild B aspiration pneumonitis 4) fluid filled stomach 5) bladder wall thickening = probably undiagnosed bladder CA  WBC 13.9k.  Lactate 2.6.  N/V persistent despite zofran, reglan.  Finally able to control with ativan.   Review of Systems: As per HPI, otherwise all review of systems negative.  Past Medical History:  Diagnosis Date  . CAD (coronary artery disease) 2017   CABG  . Cancer ([place])   . CHF (congestive heart failure) ([place])   . Colitis   . COVID-19 04/29/2019  . Diabetes ([place])   . Paronychia of second toe of right foot   . Throat cancer [facility])     Past Surgical History:  Procedure Laterality Date  . APPENDECTOMY  1970  . COLONIC STENT PLACEMENT N/A 07/12/2019   Procedure: COLONIC STENT PLACEMENT;  Surgeon: Irving Copas., MD;  Location: [facility];  Service: Gastroenterology;  Laterality: N/A;  . CORONARY ARTERY  BYPASS GRAFT     5 vessel   . FLEXIBLE SIGMOIDOSCOPY N/A 07/12/2019   Procedure: FLEXIBLE SIGMOIDOSCOPY;  Surgeon: Rush Landmark Telford Nab., MD;  Location: [facility];  Service: Gastroenterology;  Laterality: N/A;  . IR IMAGING GUIDED PORT INSERTION  04/04/2019  . TOOTH EXTRACTION       reports that he has never smoked. He has never used smokeless tobacco. He reports previous alcohol use. He reports that he does not use drugs.  No Known Allergies  Family History  Problem Relation Age of Onset  . Breast cancer Mother   . Colon polyps Father   . Heart disease Father   . Colon cancer Neg Hx   . Esophageal cancer Neg Hx   . Rectal cancer Neg Hx   . Stomach cancer Neg Hx      Prior to Admission medications   Medication Sig Start Date End Date Taking? Authorizing Provider  acetaminophen (TYLENOL) 325 MG tablet Take 650 mg by mouth every 6 (six) hours as needed for mild pain.    Yes [provider]  ascorbic acid (VITAMIN C) 500 MG tablet Take 1 tablet (500 mg total) by mouth daily. 05/05/19  Yes Eugenie Filler, MD  calcium carbonate (OSCAL) 1500 (600 Ca) MG TABS tablet Take 1,500 mg by mouth 2 (two) times daily as needed (heartburn).    Yes [provider]  Cholecalciferol (D3-1000 PO) Take 1,000 Units by mouth daily.  Yes [provider]  Chromium-Cinnamon [phone] MCG-MG CAPS Take 2,000 mg by mouth daily.   Yes [provider]  digoxin (LANOXIN) 0.25 MG tablet Take 1 tablet (0.25 mg total) by mouth daily. 07/25/19  Yes Samuella Cota, MD  ferrous sulfate 325 (65 FE) MG EC tablet Take 325 mg by mouth daily with breakfast.   Yes [provider]  glipiZIDE (GLUCOTROL) 10 MG tablet Take 10 mg by mouth daily before breakfast.   Yes [provider]  hyoscyamine (LEVSIN) 0.125 MG tablet Take 1 tablet (0.125 mg total) by mouth every 4 (four) hours as needed for cramping. 12/13/19  Yes Barb Merino, MD  Infant Care Products  [facility]) OINT Apply 1 application topically daily as needed (protectant).  12/13/19  Yes [provider]  levalbuterol (XOPENEX HFA) 45 MCG/ACT inhaler Inhale 2 puffs into the lungs every 6 (six) hours as needed for wheezing. 05/04/19  Yes Eugenie Filler, MD  loperamide (IMODIUM) 2 MG capsule Take 1 capsule by mouth 4 (four) times daily as needed for diarrhea or loose stools.    Yes [provider]  magnesium oxide (MAG-OX) 400 MG tablet Take 800 mg by mouth daily.    Yes [provider]  midodrine (PROAMATINE) 10 MG tablet Take 1 tablet (10 mg total) by mouth 3 (three) times daily with meals. 09/21/19  Yes Dwyane Dee, MD  Multiple Vitamin (MULTIVITAMIN WITH MINERALS) TABS tablet Take 1 tablet by mouth daily. 09/22/19  Yes Dwyane Dee, MD  Multiple Vitamins-Minerals (DECUBI-VITE) CAPS Take 1 capsule by mouth daily.   Yes [provider]  omeprazole (PRILOSEC) 20 MG capsule Take 1 capsule (20 mg total) by mouth 2 (two) times daily. 12/13/19 02/11/20 Yes Ghimire, Dante Gang, MD  PROPYLENE GLYCOL OP Place 1 drop into both eyes as needed (dry eyes).    Yes [provider]  Turmeric 500 MG CAPS Take 1,000 mg by mouth 2 (two) times daily.    Yes [provider]  vitamin B-12 (CYANOCOBALAMIN) 500 MCG tablet Take 500 mcg by mouth daily.   Yes [provider]    Physical Exam: Vitals:   01/03/20 0130 01/03/20 0230 01/03/20 0330 01/03/20 0430  BP: (!) 148/86 (!) 153/97 (!) 146/104 (!) 163/113  Pulse: 60 65 70 (!) 108  Resp:  19 19 15   Temp:      TempSrc:      SpO2: 95% 96% 94% 97%  Weight:      Height:        Constitutional: Ill appearing, cachectic Eyes: PERRL, lids and conjunctivae normal ENMT: Posterior erythema present. Neck: normal, supple, no masses, no thyromegaly Respiratory: clear to auscultation bilaterally, no wheezing, no crackles. Normal respiratory effort. No accessory muscle use.  Cardiovascular: Regular rate and  rhythm, no murmurs / rubs / gallops. No extremity edema. 2+ pedal pulses. No carotid bruits.  Abdomen: no tenderness, no masses palpated. No hepatosplenomegaly. Bowel sounds positive.  Musculoskeletal: no clubbing / cyanosis. No joint deformity upper and lower extremities. Good ROM, no contractures. Normal muscle tone.  Skin: no rashes, lesions, ulcers. No induration Neurologic: CN 2-12 grossly intact. Sensation intact, DTR normal. Strength 5/5 in all 4.  Psychiatric: Normal judgment and insight. Alert and oriented x 3. Flat affect.   Labs on Admission: I have personally reviewed following labs and imaging studies  CBC: Recent Labs  Lab 01/02/20 2215  WBC 13.9*  NEUTROABS 11.9*  HGB 10.3*  HCT 33.7*  MCV 85.3  PLT 360  Basic Metabolic Panel: Recent Labs  Lab 01/02/20 2215  NA 139  K 3.5  CL 98  CO2 26  GLUCOSE 224*  BUN 19  CREATININE 1.01  CALCIUM 8.8*   GFR: Estimated Creatinine Clearance: 78 mL/min (by C-G formula based on SCr of 1.01 mg/dL). Liver Function Tests: Recent Labs  Lab 01/02/20 2215  AST 19  ALT 14  ALKPHOS 111  BILITOT 0.8  PROT 6.5  ALBUMIN 2.9*   Recent Labs  Lab 01/02/20 2215  LIPASE 16   No results for input(s): AMMONIA in the last 168 hours. Coagulation Profile: No results for input(s): INR, PROTIME in the last 168 hours. Cardiac Enzymes: No results for input(s): CKTOTAL, CKMB, CKMBINDEX, TROPONINI in the last 168 hours. BNP (last 3 results) No results for input(s): PROBNP in the last 8760 hours. HbA1C: No results for input(s): HGBA1C in the last 72 hours. CBG: No results for input(s): GLUCAP in the last 168 hours. Lipid Profile: No results for input(s): CHOL, HDL, LDLCALC, TRIG, CHOLHDL, LDLDIRECT in the last 72 hours. Thyroid Function Tests: No results for input(s): TSH, T4TOTAL, FREET4, T3FREE, THYROIDAB in the last 72 hours. Anemia Panel: No results for input(s): VITAMINB12, FOLATE, FERRITIN, TIBC, IRON, RETICCTPCT in the  last 72 hours. Urine analysis:    Component Value Date/Time   COLORURINE YELLOW 09/10/2019 2239   APPEARANCEUR CLOUDY (A) 09/10/2019 2239   LABSPEC 1.017 09/10/2019 2239   PHURINE 5.0 09/10/2019 2239   GLUCOSEU NEGATIVE 09/10/2019 2239   HGBUR LARGE (A) 09/10/2019 2239   BILIRUBINUR NEGATIVE 09/10/2019 2239   KETONESUR 5 (A) 09/10/2019 2239   PROTEINUR 100 (A) 09/10/2019 2239   NITRITE NEGATIVE 09/10/2019 2239   LEUKOCYTESUR LARGE (A) 09/10/2019 2239    Radiological Exams on Admission: CT Soft Tissue Neck W Contrast  Result Date: 01/03/2020 CLINICAL DATA:  Lymphadenopathy.  Colonic carcinoma. EXAM: CT NECK WITH CONTRAST TECHNIQUE: Multidetector CT imaging of the neck was performed using the standard protocol following the bolus administration of intravenous contrast. CONTRAST:  19mL OMNIPAQUE IOHEXOL 300 MG/ML  SOLN COMPARISON:  None. FINDINGS: PHARYNX AND LARYNX: The nasopharynx, oropharynx and larynx are normal. Visible portions of the oral cavity, tongue base and floor of mouth are normal. Normal epiglottis, vallecula and pyriform sinuses. The larynx is normal. No retropharyngeal abscess, effusion or lymphadenopathy. SALIVARY GLANDS: Parotid glands are normal. Nonvisualized submandibular glands. THYROID: Normal. LYMPH NODES: No enlarged or abnormal density lymph nodes. VASCULAR: There is predominantly noncalcified atherosclerosis of both carotid systems without high-grade stenosis. LIMITED INTRACRANIAL: Normal. VISUALIZED ORBITS: Normal. MASTOIDS AND VISUALIZED PARANASAL SINUSES: No fluid levels or advanced mucosal thickening. No mastoid effusion. SKELETON: No bony spinal canal stenosis. No lytic or blastic lesions. UPPER CHEST: Clear. OTHER: None. IMPRESSION: 1. No cervical lymphadenopathy. 2. Bilateral predominantly noncalcified carotid bifurcation atherosclerosis without high-grade stenosis. Electronically Signed   By: Ulyses Jarred M.D.   On: 01/03/2020 02:49   CT ABDOMEN PELVIS W  CONTRAST  Result Date: 01/03/2020 CLINICAL DATA:  Nausea, vomiting EXAM: CT ABDOMEN AND PELVIS WITH CONTRAST TECHNIQUE: Multidetector CT imaging of the abdomen and pelvis was performed using the standard protocol following bolus administration of intravenous contrast. CONTRAST:  143mL OMNIPAQUE IOHEXOL 300 MG/ML  SOLN COMPARISON:  12/05/2019 FINDINGS: Lower chest: Minimal nodular infiltrate has developed within the a posterior basal segments of the lower lobes bilaterally, possibly reflecting changes related to aspiration, less likely atypical infection. Cardiac size within normal limits. Mild coronary artery calcification noted. Median sternotomy has been performed. Hepatobiliary: Multiple hepatic  metastases are again identified and appear grossly stable since prior examination. No intra or extrahepatic biliary ductal dilation. Cholelithiasis again noted. Pancreas: Cystic lesion previously noted within the pancreatic head is not well appreciated on the current examination. The pancreas is mildly atrophic, but is otherwise unremarkable. Spleen: Unremarkable Adrenals/Urinary Tract: The adrenal glands are unremarkable. The kidneys are normal in size and position. Multiple simple cortical cysts are seen bilaterally. 3 mm nonobstructing calculus is seen within the lower pole of the left kidney. No hydronephrosis. No solid intrarenal masses. No ureteral calculi. Eccentric bladder wall thickening involving the a posterior wall the bladder is again noted likely reflecting an underlying urothelial carcinoma. There is high attenuation material within the bladder lumen which likely reflect excreted urinary contrast. Stomach/Bowel: The stomach is fluid-filled. No evidence of gastric outlet obstruction, however. Small bowel is unremarkable. Appendix normal. Palliative stent noted within the a distal sigmoid colon, unchanged from prior examination. Moderate stool seen upstream from the colonic stent though the caliber of the  large bowel is within normal limits. Moderate stool within the rectal vault. No free intraperitoneal gas or fluid. Vascular/Lymphatic: There is central hypodensity within the left common femoral vein suspicious for a a deep venous thrombus in this location. The abdominal vasculature is otherwise unremarkable. No pathologic adenopathy within the abdomen and pelvis. Reproductive: Prostate is unremarkable. Other: Rectum unremarkable Musculoskeletal: Degenerative changes are seen within the lumbar spine. No lytic or blastic bone lesions are seen. IMPRESSION: Trace inflammatory changes within the visualized lung bases likely related to aspiration. Fluid-filled stomach and given history of vomiting with support this assertion. Numerous hepatic metastases in keeping with metastatic colon cancer, unchanged from prior examination. Colonic stent noted within the sigmoid colon with moderate stool noted upstream throughout the colon without frank dilation to suggest complete obstruction. 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 Cholelithiasis without CT evidence of acute cholecystitis. Known pancreatic cystic lesion is not well visualized on this examination. Aortic Atherosclerosis (ICD10-I70.0). Electronically Signed   By: Fidela Salisbury MD   On: 01/03/2020 03:08   DG Chest Portable 1 View  Result Date: 01/03/2020 CLINICAL DATA:  Cough, fever, vomiting EXAM: PORTABLE CHEST 1 VIEW COMPARISON:  09/10/2019 FINDINGS: Lungs are clear. No pneumothorax or pleural effusion. Right internal jugular chest port tip seen within the right atrium. Coronary artery bypass grafting has been performed. Cardiac size within normal limits. Pulmonary vascularity is normal. No acute bone abnormality. IMPRESSION: And Electronically Signed   By: Fidela Salisbury MD   On: 01/03/2020 00:24    EKG: Independently reviewed.  Assessment/Plan Principal Problem:    Intractable vomiting Active Problems:   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HCC)   Type 2 diabetes mellitus without complication (HCC)   Orthostatic hypotension   Aspiration pneumonitis (HCC)   PAF (paroxysmal atrial fibrillation) (HCC)   Bladder wall thickening   History of throat cancer    1. Intractable vomiting - 1. Vomiting stopped for the moment after zofran, reglan, and ativan 2. PRN zofran for the moment 3. If vomiting resumes, may need to consider NGT, fluid filled stomach 4. No obvious obstruction based on CT scan 5. NPO 6. IVF 2. Cancers - 1. Throat cancer seems to still be in remission based on repeat CT today, unfortunately that's where the good news regarding cancer ends for this patient. 2. Colon CA with liver mets, is progressive despite 12 cycles of FOLFOX 3. Based on CT: concern patient  may also have un diagnosed bladder cancer too 4. Given poor response to chemo, down ward progression recently.  Feel it might be a good time to get pal care involved 3. Aspiration pneumonitis - 1. Unasyn 2. SLP eval and treat for swallow evaluation 3. WBC of 13.9k, no other SIRS at this time nor O2 requirement. 4. DM2 - 1. Hold home meds 2. Sensitive SSI Q4H 5. Orthostatic hypotension 1. Cont Midodrine 6. PAF - 1. Resume digoxin 2. CHADS vasc of 4, not on anticoagulation chronically, and doesn't seem like a great idea right now when he just had hematemesis admission earlier this month.  DVT prophylaxis: Lovenox Code Status: Full Family Communication: No family in room Disposition Plan: Home after vomiting controlled and able to take POs Consults called: Pal care consult put into Epic Admission status: Place in [place]     Vincenta Steffey, [place] Hospitalists  How to contact the [facility] Attending or Consulting provider [place] or covering provider during after hours [place], for this patient?  1. Check the care team in [facility] and look for a) attending/consulting TR[facility] provider  listed and b) the [facility] team listed 2. Log into [email or web address]  Amion Physician Scheduling and messaging for groups and whole hospitals  On call and physician scheduling software for group practices, residents, hospitalists and other medical providers for call, clinic, rotation and shift schedules. OnCall [employer] is a hospital-wide system for scheduling doctors and paging doctors on call. EasyPlot is for scientific plotting and data analysis.  [email or web address]  and use [place]'s universal password to access. If you do not have the password, please contact the hospital operator.  3. Locate the [facility] provider you are looking for under [facility] and page to a number that you can be directly reached. 4. If you still have difficulty reaching the provider, please page the [facility] (Director on Call) for the Hospitalists listed on amion for assistance.  01/03/2020, 4:50 AM

## 2020-01-03 NOTE — Evaluation (Signed)
SLP Cancellation Note  Patient Details Name: Clarence Andrews. MRN: [id number] DOB: 07/30/1950   Cancelled treatment:       Reason Eval/Treat Not Completed: Other (comment) (SLP reached out to RN regarding swallow eval, pt's symptoms of n/v are just now undercontrol, she was in agreement that it is best to hold on eval at this time)   Macario Golds 01/03/2020, 12:27 PM  Kathleen Lime, MS [facility] SLP [place] Office [phone] Pager [phone]

## 2020-01-04 ENCOUNTER — Inpatient Hospital Stay (HOSPITAL_COMMUNITY): Payer: Medicare Other

## 2020-01-04 DIAGNOSIS — C187 Malignant neoplasm of sigmoid colon: Secondary | ICD-10-CM | POA: Diagnosis not present

## 2020-01-04 DIAGNOSIS — C787 Secondary malignant neoplasm of liver and intrahepatic bile duct: Secondary | ICD-10-CM | POA: Diagnosis not present

## 2020-01-04 DIAGNOSIS — R112 Nausea with vomiting, unspecified: Secondary | ICD-10-CM | POA: Diagnosis not present

## 2020-01-04 DIAGNOSIS — Z515 Encounter for palliative care: Secondary | ICD-10-CM | POA: Diagnosis not present

## 2020-01-04 LAB — BASIC METABOLIC PANEL
Anion gap: 10 (ref 5–15)
BUN: 15 mg/dL (ref 8–23)
CO2: 27 mmol/L (ref 22–32)
Calcium: 8 mg/dL — ABNORMAL LOW (ref 8.9–10.3)
Chloride: 100 mmol/L (ref 98–111)
Creatinine, Ser: 0.82 mg/dL (ref 0.61–1.24)
GFR, Estimated: >60 mL/min (ref >60)
Glucose, Bld: 112 mg/dL — ABNORMAL HIGH (ref 70–99)
Potassium: 3.2 mmol/L — ABNORMAL LOW (ref 3.5–5.1)
Sodium: 137 mmol/L (ref 135–145)

## 2020-01-04 LAB — CBC
HCT: 28.5 % — ABNORMAL LOW (ref 39.0–52.0)
Hemoglobin: 8.8 g/dL — ABNORMAL LOW (ref 13.0–17.0)
MCH: 26.2 pg (ref 26.0–34.0)
MCHC: 30.9 g/dL (ref 30.0–36.0)
MCV: 84.8 fL (ref 80.0–100.0)
Platelets: 336 10*3/uL (ref 150–400)
RBC: 3.36 MIL/uL — ABNORMAL LOW (ref 4.22–5.81)
RDW: 16.1 % — ABNORMAL HIGH (ref 11.5–15.5)
WBC: 16 10*3/uL — ABNORMAL HIGH (ref 4.0–10.5)
nRBC: 0 % (ref 0.0–0.2)

## 2020-01-04 LAB — GLUCOSE, CAPILLARY
Glucose-Capillary: 109 mg/dL — ABNORMAL HIGH (ref 70–99)
Glucose-Capillary: 122 mg/dL — ABNORMAL HIGH (ref 70–99)
Glucose-Capillary: 90 mg/dL (ref 70–99)
Glucose-Capillary: 94 mg/dL (ref 70–99)
Glucose-Capillary: 96 mg/dL (ref 70–99)

## 2020-01-04 LAB — HEPARIN LEVEL (UNFRACTIONATED)
Heparin Unfractionated: 0.62 IU/mL (ref 0.30–0.70)
Heparin Unfractionated: 0.61 IU/mL (ref 0.30–0.70)

## 2020-01-04 LAB — TROPONIN I (HIGH SENSITIVITY): Troponin I (High Sensitivity): 23 ng/L — ABNORMAL HIGH (ref <18)

## 2020-01-04 LAB — MAGNESIUM: Magnesium: 1.5 mg/dL — ABNORMAL LOW (ref 1.7–2.4)

## 2020-01-04 MED ORDER — POTASSIUM CHLORIDE CRYS ER 20 MEQ PO TBCR
40.0000 meq | EXTENDED_RELEASE_TABLET | Freq: Once | ORAL | Status: AC
  Administered 2020-01-04: 40 meq via ORAL
  Filled 2020-01-04: qty 2

## 2020-01-04 MED ORDER — OXYCODONE-ACETAMINOPHEN 5-325 MG PO TABS: 1.0000 | ORAL_TABLET | ORAL | Status: DC | PRN

## 2020-01-04 MED ORDER — ACETAMINOPHEN 325 MG PO TABS
650.0000 mg | ORAL_TABLET | Freq: Four times a day (QID) | ORAL | Status: DC | PRN
  Administered 2020-01-06: 650 mg via ORAL
  Filled 2020-01-04: qty 2

## 2020-01-04 MED ORDER — LIDOCAINE VISCOUS HCL 2 % MT SOLN
15.0000 mL | Freq: Once | OROMUCOSAL | Status: AC
  Administered 2020-01-04: 15 mL via ORAL
  Filled 2020-01-04: qty 15

## 2020-01-04 MED ORDER — DIGOXIN 125 MCG PO TABS
0.2500 mg | ORAL_TABLET | Freq: Every day | ORAL | Status: DC
  Administered 2020-01-04 – 2020-01-07 (×4): 0.25 mg via ORAL
  Filled 2020-01-04 (×4): qty 2

## 2020-01-04 MED ORDER — MORPHINE SULFATE (PF) 2 MG/ML IV SOLN
2.0000 mg | INTRAVENOUS | Status: DC | PRN
  Administered 2020-01-05 (×2): 2 mg via INTRAVENOUS
  Administered 2020-01-05 (×2): 4 mg via INTRAVENOUS
  Filled 2020-01-04 (×2): qty 2
  Filled 2020-01-04 (×2): qty 1

## 2020-01-04 MED ORDER — LIDOCAINE VISCOUS HCL 2 % MT SOLN: 15.0000 mL | OROMUCOSAL | Status: DC | PRN

## 2020-01-04 MED ORDER — MAGNESIUM SULFATE 2 GM/50ML IV SOLN
2.0000 g | Freq: Once | INTRAVENOUS | Status: AC
  Administered 2020-01-04: 2 g via INTRAVENOUS
  Filled 2020-01-04: qty 50

## 2020-01-04 MED ORDER — MAGIC MOUTHWASH
15.0000 mL | Freq: Four times a day (QID) | ORAL | Status: DC
  Administered 2020-01-04 – 2020-01-05 (×5): 15 mL via ORAL
  Administered 2020-01-06: 5 mL via ORAL
  Administered 2020-01-06: 15 mL via ORAL
  Filled 2020-01-04 (×15): qty 15

## 2020-01-04 MED ORDER — ALUM & MAG HYDROXIDE-SIMETH 200-200-20 MG/5ML PO SUSP
30.0000 mL | Freq: Once | ORAL | Status: AC
  Administered 2020-01-04: 30 mL via ORAL
  Filled 2020-01-04: qty 30

## 2020-01-04 NOTE — Progress Notes (Signed)
[place] for IV heparin Indication: L DVT  No Known Allergies  Patient Measurements: Height: 6\' 2"  (188 cm) Weight: 83.4 kg (183 lb 12.8 oz) IBW/kg (Calculated) : 82.2 Heparin Dosing Weight: n/a. Use TBW   Vital Signs: Temp: 97.2 F (36.2 C) (12/04 0539) Temp Source: Oral (12/04 0539) BP: 138/88 (12/04 1056) Pulse Rate: 102 (12/04 1056)  Labs: Recent Labs    01/02/20 2215 01/02/20 2215 01/03/20 0803 01/04/20 0315 01/04/20 1120  HGB 10.3*   < > 9.2* 8.8*  --   HCT 33.7*  --  30.3* 28.5*  --   PLT 360  --  314 336  --   HEPARINUNFRC  --   --   --  0.62 0.61  CREATININE 1.01  --  1.06 0.82  --    < > = values in this interval not displayed.    Estimated Creatinine Clearance: 96.1 mL/min (by C-G formula based on SCr of 0.82 mg/dL).  Medical History: Past Medical History:  Diagnosis Date  . CAD (coronary artery disease) 2017   CABG  . Cancer ([place])   . CHF (congestive heart failure) ([place])   . Colitis   . COVID-19 04/29/2019  . Diabetes ([place])   . Paronychia of second toe of right foot   . Throat cancer [facility])     Assessment: 23 yoM with PMH DM2, PAF not on anticoag d/t hx hematemesis, colon CA metastatic to liver, admitted 12/2 for intractable vomiting. CTa/p suggestive of DVT, and vascular duplex reveals L DVT. Pharmacy consulted to dose heparin IV.  During 12/11/19 admission, noted to have "one episode of emesis that looked like coffee-ground. FOBT positive. Hemoglobin at baseline. Recent upper GI endoscopy was normal. He is not on any PPI. Started on PPI. Currently no indication for endoscopic evaluation."   Baseline INR, aPTT: not done  Prior anticoagulation: none d/t recent hematemesis;   Today, 01/04/2020:  Confirmatory HL = 0.61 remains therapeutic on heparin infusion of 1450 units/hr  CBC: Hgb (8.8) low and slightly decreased. Plt WNL & stable  Note most recent chemo on 12/09/19  SCr stable WNL (at  baseline)  Confirmed with RN that heparin infusing at correct rate. No signs of bleeding reported.  Goal of Therapy: Heparin level 0.3-0.7 units/ml Monitor platelets by anticoagulation protocol: Yes  Plan:  Continue heparin infusion at current rate of 1450 units/hr  Daily CBC & heparin level while on heparin  Monitor for signs of bleeding  Lenis Noon, PharmD 01/04/20 12:08 PM

## 2020-01-04 NOTE — Consult Note (Signed)
Palliative Medicine  Name: Clarence Andrews. Date: 01/04/2020 MRN: [id number]  DOB: 08-02-1950  Patient Care Team: Patient, No Pcp Per as PCP - General (General Practice) Nahser, Wonda Cheng, MD as PCP - Cardiology (Cardiology) Jonnie Finner, RN as Oncology Nurse Navigator    REASON FOR CONSULTATION: Clarence Andrews. is a 69 y.o. 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 difficile colitis, orthostatic hypotension, PAF and history of DVT.  Patient was hospitalized 12/11/2019-12/13/2019 with upper GI bleed, which resolved with conservative management.  Patient was admitted to the hospital on 01/03/2020 with neck pain and intractable vomiting.  Abdominal CT did not show obvious obstruction but did reveal mural thickening of the bladder suggestive for urothelial cancer.  SOCIAL HISTORY:     reports that he has never smoked. He has never used smokeless tobacco. He reports previous alcohol use. He reports that he does not use drugs.  Patient is divorced.  He lives alone in a camper.  Patient has two sons, both of whom live in [place].  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  ADVANCE DIRECTIVES:  Not on file  CODE STATUS: DNR  PAST MEDICAL HISTORY: Past Medical History:  Diagnosis Date  . CAD (coronary artery disease) 2017   CABG  . Cancer ([place])   . CHF (congestive heart failure) ([place])   . Colitis   . COVID-19 04/29/2019  . Diabetes ([place])   . Paronychia of second toe of right foot   . Throat cancer ([place])     PAST SURGICAL HISTORY:  Past Surgical History:  Procedure  Laterality Date  . APPENDECTOMY  1970  . COLONIC STENT PLACEMENT N/A 07/12/2019   Procedure: COLONIC STENT PLACEMENT;  Surgeon: Irving Copas., MD;  Location: [facility];  Service: Gastroenterology;  Laterality: N/A;  . CORONARY ARTERY BYPASS GRAFT     5 vessel   . FLEXIBLE SIGMOIDOSCOPY N/A 07/12/2019   Procedure: FLEXIBLE SIGMOIDOSCOPY;  Surgeon: Rush Landmark Telford Nab., MD;  Location: [facility];  Service: Gastroenterology;  Laterality: N/A;  . IR IMAGING GUIDED PORT INSERTION  04/04/2019  . TOOTH EXTRACTION      HEMATOLOGY/ONCOLOGY HISTORY:  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[place])  04/02/2019 Initial Diagnosis   Cancer of left colon ([place])   04/11/2019 - 11/27/2019 Chemotherapy   The patient had palonosetron (ALOXI) injection 0.25 mg, 0.25 mg, Intravenous,  Once, 8 of 8 cycles Administration: 0.25 mg (04/11/2019), 0.25 mg (05/28/2019), 0.25 mg (06/11/2019), 0.25 mg (06/25/2019), 0.25 mg (07/31/2019), 0.25 mg (08/13/2019), 0.25 mg (08/27/2019) pegfilgrastim-cbqv (UDENYCA) injection 6 mg, 6 mg, Subcutaneous, Once, 5 of 5 cycles Administration: 6 mg (05/30/2019), 6 mg (06/13/2019), 6 mg (06/27/2019), 6 mg (08/15/2019), 6 mg (08/29/2019) fluorouracil (ADRUCIL) 4,650 mg in sodium chloride 0.9 % 1,000 mL chemo infusion, 2,000 mg/m2 = 4,650 mg (100 % of original dose 2,000 mg/m2), Intravenous, Once, 1 of 1 cycle Dose modification: 2,000 mg/m2 (original dose 2,000 mg/m2, Cycle 5, Reason: Provider Judgment) Administration: 4,650 mg (07/31/2019) leucovorin 972 mg in dextrose 5 % 250 mL infusion, 400 mg/m2 = 972 mg, Intravenous,  Once, 8 of 8 cycles Administration:  972 mg (04/11/2019), 972 mg (05/28/2019), 972 mg (06/11/2019), 972 mg (06/25/2019), 932 mg (07/31/2019), 932 mg (08/13/2019), 932 mg (08/27/2019) oxaliplatin (ELOXATIN) 200 mg in dextrose 5 % 500 mL chemo infusion, 83 mg/m2 = 205 mg, Intravenous,  Once, 8 of 8 cycles Dose modification: 65 mg/m2 (original dose 85 mg/m2, Cycle 6, Reason:  Provider Judgment) Administration: 200 mg (04/11/2019), 200 mg (05/28/2019), 200 mg (06/11/2019), 200 mg (06/25/2019), 150 mg (07/31/2019), 150 mg (08/13/2019), 150 mg (08/27/2019) fluorouracil (ADRUCIL) chemo injection 950 mg, 400 mg/m2 = 950 mg, Intravenous,  Once, 1 of 1 cycle Administration: 950 mg (04/11/2019) fluorouracil (ADRUCIL) 5,850 mg in sodium chloride 0.9 % 133 mL chemo infusion, 2,400 mg/m2 = 5,850 mg, Intravenous, 1 Day/Dose, 12 of 13 cycles Dose modification: 2,000 mg/m2 (original dose 2,400 mg/m2, Cycle 2, Reason: Provider Judgment), 1,600 mg/m2 (original dose 2,400 mg/m2, Cycle 8, Reason: Provider Judgment) Administration: 5,850 mg (04/11/2019), 4,850 mg (05/28/2019), 4,850 mg (06/11/2019), 4,850 mg (06/25/2019), 4,650 mg (08/13/2019), 4,650 mg (08/27/2019), 3,300 mg (09/26/2019), 3,300 mg (10/14/2019), 3,300 mg (10/28/2019), 3,300 mg (11/11/2019), 3,300 mg (11/25/2019)  for chemotherapy treatment.    12/09/2019 -  Chemotherapy   The patient had palonosetron (ALOXI) injection 0.25 mg, 0.25 mg, Intravenous,  Once, 1 of 4 cycles Administration: 0.25 mg (12/09/2019) irinotecan (CAMPTOSAR) 260 mg in sodium chloride 0.9 % 500 mL chemo infusion, 125 mg/m2 = 260 mg (100 % of original dose 125 mg/m2), Intravenous,  Once, 1 of 4 cycles Dose modification: 125 mg/m2 (original dose 125 mg/m2, Cycle 1, Reason: Provider Judgment) Administration: 260 mg (12/09/2019) fosaprepitant (EMEND) 150 mg in sodium chloride 0.9 % 145 mL IVPB, 150 mg, Intravenous,  Once, 0 of 3 cycles fluorouracil (ADRUCIL) 3,400 mg in sodium chloride 0.9 % 82 mL chemo infusion, 1,600 mg/m2 = 3,400 mg (100 % of original dose 1,600 mg/m2), Intravenous, 1 Day/Dose, 1 of 4 cycles Dose modification: 1,600 mg/m2 (original dose 1,600 mg/m2, Cycle 1, Reason: Provider Judgment) Administration: 3,400 mg (12/09/2019) bevacizumab-awwb (MVASI) 400 mg in sodium chloride 0.9 % 100 mL chemo infusion, 5 mg/kg = 400 mg (100 % of original dose 5 mg/kg),  Intravenous,  Once, 1 of 4 cycles Dose modification: 5 mg/kg (original dose 5 mg/kg, Cycle 1) Administration: 400 mg (12/09/2019) bevacizumab-bvzr (ZIRABEV) 400 mg in sodium chloride 0.9 % 100 mL chemo infusion, 5 mg/kg = 400 mg, Intravenous,  Once, 1 of 1 cycle leucovorin 422 mg in sodium chloride 0.9 % 250 mL infusion, 200 mg/m2 = 422 mg (100 % of original dose 200 mg/m2), Intravenous,  Once, 1 of 4 cycles Dose modification: 200 mg/m2 (original dose 200 mg/m2, Cycle 1, Reason: Provider Judgment) Administration: 422 mg (12/09/2019)  for chemotherapy treatment.    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[place])   04/11/2019 - 11/27/2019 Chemotherapy   The patient had palonosetron (ALOXI) injection 0.25 mg, 0.25 mg, Intravenous,  Once, 8 of 8 cycles Administration: 0.25 mg (04/11/2019), 0.25 mg (05/28/2019), 0.25 mg (06/11/2019), 0.25 mg (06/25/2019), 0.25 mg (07/31/2019), 0.25 mg (08/13/2019), 0.25 mg (08/27/2019) pegfilgrastim-cbqv (UDENYCA) injection 6 mg, 6 mg, Subcutaneous, Once, 5 of 5 cycles Administration: 6 mg (05/30/2019), 6 mg (06/13/2019), 6 mg (06/27/2019), 6 mg (08/15/2019), 6 mg (08/29/2019) fluorouracil (ADRUCIL) 4,650 mg in sodium chloride 0.9 % 1,000 mL chemo infusion, 2,000 mg/m2 = 4,650 mg (100 % of original dose 2,000 mg/m2), Intravenous, Once, 1 of 1 cycle Dose modification: 2,000 mg/m2 (original dose 2,000 mg/m2, Cycle 5,  Reason: Provider Judgment) Administration: 4,650 mg (07/31/2019) leucovorin 972 mg in dextrose 5 % 250 mL infusion, 400 mg/m2 = 972 mg, Intravenous,  Once, 8 of 8 cycles Administration: 972 mg (04/11/2019), 972 mg (05/28/2019), 972 mg (06/11/2019), 972 mg (06/25/2019), 932 mg (07/31/2019), 932 mg (08/13/2019), 932 mg (08/27/2019) oxaliplatin (ELOXATIN) 200 mg in dextrose 5 % 500 mL chemo infusion, 83 mg/m2 = 205 mg, Intravenous,  Once, 8 of 8 cycles Dose modification: 65 mg/m2 (original dose 85 mg/m2, Cycle 6,  Reason: Provider Judgment) Administration: 200 mg (04/11/2019), 200 mg (05/28/2019), 200 mg (06/11/2019), 200 mg (06/25/2019), 150 mg (07/31/2019), 150 mg (08/13/2019), 150 mg (08/27/2019) fluorouracil (ADRUCIL) chemo injection 950 mg, 400 mg/m2 = 950 mg, Intravenous,  Once, 1 of 1 cycle Administration: 950 mg (04/11/2019) fluorouracil (ADRUCIL) 5,850 mg in sodium chloride 0.9 % 133 mL chemo infusion, 2,400 mg/m2 = 5,850 mg, Intravenous, 1 Day/Dose, 12 of 13 cycles Dose modification: 2,000 mg/m2 (original dose 2,400 mg/m2, Cycle 2, Reason: Provider Judgment), 1,600 mg/m2 (original dose 2,400 mg/m2, Cycle 8, Reason: Provider Judgment) Administration: 5,850 mg (04/11/2019), 4,850 mg (05/28/2019), 4,850 mg (06/11/2019), 4,850 mg (06/25/2019), 4,650 mg (08/13/2019), 4,650 mg (08/27/2019), 3,300 mg (09/26/2019), 3,300 mg (10/14/2019), 3,300 mg (10/28/2019), 3,300 mg (11/11/2019), 3,300 mg (11/25/2019)  for chemotherapy treatment.    12/09/2019 -  Chemotherapy   The patient had palonosetron (ALOXI) injection 0.25 mg, 0.25 mg, Intravenous,  Once, 1 of 4 cycles Administration: 0.25 mg (12/09/2019) irinotecan (CAMPTOSAR) 260 mg in sodium chloride 0.9 % 500 mL chemo infusion, 125 mg/m2 = 260 mg (100 % of original dose 125 mg/m2), Intravenous,  Once, 1 of 4 cycles Dose modification: 125 mg/m2 (original dose 125 mg/m2, Cycle 1, Reason: Provider Judgment) Administration: 260 mg (12/09/2019) fosaprepitant (EMEND) 150 mg in sodium chloride 0.9 % 145 mL IVPB, 150 mg, Intravenous,  Once, 0 of 3 cycles fluorouracil (ADRUCIL) 3,400 mg in sodium chloride 0.9 % 82 mL chemo infusion, 1,600 mg/m2 = 3,400 mg (100 % of original dose 1,600 mg/m2), Intravenous, 1 Day/Dose, 1 of 4 cycles Dose modification: 1,600 mg/m2 (original dose 1,600 mg/m2, Cycle 1, Reason: Provider Judgment) Administration: 3,400 mg (12/09/2019) bevacizumab-awwb (MVASI) 400 mg in sodium chloride 0.9 % 100 mL chemo infusion, 5 mg/kg = 400 mg (100 % of original dose 5  mg/kg), Intravenous,  Once, 1 of 4 cycles Dose modification: 5 mg/kg (original dose 5 mg/kg, Cycle 1) Administration: 400 mg (12/09/2019) bevacizumab-bvzr (ZIRABEV) 400 mg in sodium chloride 0.9 % 100 mL chemo infusion, 5 mg/kg = 400 mg, Intravenous,  Once, 1 of 1 cycle leucovorin 422 mg in sodium chloride 0.9 % 250 mL infusion, 200 mg/m2 = 422 mg (100 % of original dose 200 mg/m2), Intravenous,  Once, 1 of 4 cycles Dose modification: 200 mg/m2 (original dose 200 mg/m2, Cycle 1, Reason: Provider Judgment) Administration: 422 mg (12/09/2019)  for chemotherapy treatment.    Malignant neoplasm of colon ([place])  05/01/2019 Initial Diagnosis   Malignant neoplasm of colon ([place])   12/09/2019 -  Chemotherapy   The patient had palonosetron (ALOXI) injection 0.25 mg, 0.25 mg, Intravenous,  Once, 1 of 4 cycles Administration: 0.25 mg (12/09/2019) irinotecan (CAMPTOSAR) 260 mg in sodium chloride 0.9 % 500 mL chemo infusion, 125 mg/m2 = 260 mg (100 % of original dose 125 mg/m2), Intravenous,  Once, 1 of 4 cycles Dose modification: 125 mg/m2 (original dose 125 mg/m2, Cycle 1, Reason: Provider Judgment) Administration: 260 mg (12/09/2019) fosaprepitant (EMEND) 150 mg in sodium chloride 0.9 % 145 mL  IVPB, 150 mg, Intravenous,  Once, 0 of 3 cycles fluorouracil (ADRUCIL) 3,400 mg in sodium chloride 0.9 % 82 mL chemo infusion, 1,600 mg/m2 = 3,400 mg (100 % of original dose 1,600 mg/m2), Intravenous, 1 Day/Dose, 1 of 4 cycles Dose modification: 1,600 mg/m2 (original dose 1,600 mg/m2, Cycle 1, Reason: Provider Judgment) Administration: 3,400 mg (12/09/2019) bevacizumab-awwb (MVASI) 400 mg in sodium chloride 0.9 % 100 mL chemo infusion, 5 mg/kg = 400 mg (100 % of original dose 5 mg/kg), Intravenous,  Once, 1 of 4 cycles Dose modification: 5 mg/kg (original dose 5 mg/kg, Cycle 1) Administration: 400 mg (12/09/2019) bevacizumab-bvzr (ZIRABEV) 400 mg in sodium chloride 0.9 % 100 mL chemo infusion, 5 mg/kg = 400 mg,  Intravenous,  Once, 1 of 1 cycle leucovorin 422 mg in sodium chloride 0.9 % 250 mL infusion, 200 mg/m2 = 422 mg (100 % of original dose 200 mg/m2), Intravenous,  Once, 1 of 4 cycles Dose modification: 200 mg/m2 (original dose 200 mg/m2, Cycle 1, Reason: Provider Judgment) Administration: 422 mg (12/09/2019)  for chemotherapy treatment.      ALLERGIES:  has No Known Allergies.  MEDICATIONS:  Current Facility-Administered Medications  Medication Dose Route Frequency Provider Last Rate Last Admin  . acetaminophen (TYLENOL) tablet 650 mg  650 mg Oral Q6H PRN Lavina Hamman, MD      . Ampicillin-Sulbactam (UNASYN) 3 g in sodium chloride 0.9 % 100 mL IVPB  3 g Intravenous Q6H Angela Adam, RPH 200 mL/hr at 01/04/20 1151 3 g at 01/04/20 1151  . bisacodyl (DULCOLAX) suppository 10 mg  10 mg Rectal Daily PRN Lavina Hamman, MD      . Chlorhexidine Gluconate Cloth 2 % PADS 6 each  6 each Topical Daily Lavina Hamman, MD   6 each at 01/04/20 1025  . chlorproMAZINE (THORAZINE) 12.5 mg in sodium chloride 0.9 % 25 mL IVPB  12.5 mg Intravenous Q8H PRN Lavina Hamman, MD      . digoxin Fonnie Birkenhead) tablet 0.25 mg  0.25 mg Oral Daily Lavina Hamman, MD   0.25 mg at 01/04/20 1021  . docusate sodium (COLACE) capsule 100 mg  100 mg Oral BID Lavina Hamman, MD   100 mg at 01/04/20 1021  . heparin ADULT infusion 100 units/mL (25000 units/274m sodium chloride 0.45%)  1,450 Units/hr Intravenous Continuous WPolly Cobia RPH 14.5 mL/hr at 01/04/20 1034 1,450 Units/hr at 01/04/20 1034  . insulin aspart (novoLOG) injection 0-9 Units  0-9 Units Subcutaneous Q4H GJennette KettleM, DO   1 Units at 01/04/20 1255  . lactated ringers infusion   Intravenous Continuous PLavina Hamman MD 75 mL/hr at 01/04/20 1022 Rate Change at 01/04/20 1022  . lidocaine (XYLOCAINE) 2 % viscous mouth solution 15 mL  15 mL Mouth/Throat Q3H PRN PLavina Hamman MD      . magic mouthwash  15 mL Oral QID PLavina Hamman MD   15 mL at  01/04/20 1040  . morphine 2 MG/ML injection 2-4 mg  2-4 mg Intravenous Q3H PRN PLavina Hamman MD      . ondansetron ([facility] tablet 4 mg  4 mg Oral Q6H PRN GEtta Quill DO       Or  . ondansetron ([facility] injection 4 mg  4 mg Intravenous Q6H PRN GEtta Quill DO      . oxyCODONE-acetaminophen (PERCOCET/ROXICET) 5-325 MG per tablet 1 tablet  1 tablet Oral Q4H PRN PLavina Hamman MD      .  pantoprazole (PROTONIX) injection 40 mg  40 mg Intravenous Q24H Lavina Hamman, MD   40 mg at 01/04/20 1025  . polyethylene glycol (MIRALAX / GLYCOLAX) packet 17 g  17 g Oral Daily Lavina Hamman, MD   17 g at 01/04/20 1023    VITAL SIGNS: BP 138/88   Pulse (!) 102   Temp (!) 97.2 F (36.2 C) (Oral)   Resp 14   Ht 6' 2"  (1.88 m)   Wt 183 lb 12.8 oz (83.4 kg)   SpO2 98%   BMI 23.60 kg/m  Filed Weights   01/02/20 2142 01/03/20 1710  Weight: 183 lb 3.2 oz (83.1 kg) 183 lb 12.8 oz (83.4 kg)    Estimated body mass index is 23.6 kg/m as calculated from the following:   Height as of this encounter: 6' 2"  (1.88 m).   Weight as of this encounter: 183 lb 12.8 oz (83.4 kg).  LABS: CBC:    Component Value Date/Time   WBC 16.0 (H) 01/04/2020 0315   HGB 8.8 (L) 01/04/2020 0315   HGB 8.8 (L) 12/09/2019 0842   HCT 28.5 (L) 01/04/2020 0315   PLT 336 01/04/2020 0315   PLT 320 12/09/2019 0842   MCV 84.8 01/04/2020 0315   NEUTROABS 12.5 (H) 01/03/2020 0803   LYMPHSABS 0.6 (L) 01/03/2020 0803   MONOABS 0.6 01/03/2020 0803   EOSABS 0.0 01/03/2020 0803   BASOSABS 0.0 01/03/2020 0803   Comprehensive Metabolic Panel:    Component Value Date/Time   NA 137 01/04/2020 0315   K 3.2 (L) 01/04/2020 0315   CL 100 01/04/2020 0315   CO2 27 01/04/2020 0315   BUN 15 01/04/2020 0315   CREATININE 0.82 01/04/2020 0315   CREATININE 1.14 12/09/2019 0842   GLUCOSE 112 (H) 01/04/2020 0315   CALCIUM 8.0 (L) 01/04/2020 0315   AST 16 01/03/2020 0803   AST 23 12/09/2019 0842   ALT 12 01/03/2020 0803    ALT 16 12/09/2019 0842   ALKPHOS 99 01/03/2020 0803   BILITOT 0.6 01/03/2020 0803   BILITOT 0.3 12/09/2019 0842   PROT 6.1 (L) 01/03/2020 0803   ALBUMIN 2.8 (L) 01/03/2020 0803    RADIOGRAPHIC STUDIES: CT Soft Tissue Neck W Contrast  Result Date: 01/03/2020 CLINICAL DATA:  Lymphadenopathy.  Colonic carcinoma. EXAM: CT NECK WITH CONTRAST TECHNIQUE: Multidetector CT imaging of the neck was performed using the standard protocol following the bolus administration of intravenous contrast. CONTRAST:  169m OMNIPAQUE IOHEXOL 300 MG/ML  SOLN COMPARISON:  None. FINDINGS: PHARYNX AND LARYNX: The nasopharynx, oropharynx and larynx are normal. Visible portions of the oral cavity, tongue base and floor of mouth are normal. Normal epiglottis, vallecula and pyriform sinuses. The larynx is normal. No retropharyngeal abscess, effusion or lymphadenopathy. SALIVARY GLANDS: Parotid glands are normal. Nonvisualized submandibular glands. THYROID: Normal. LYMPH NODES: No enlarged or abnormal density lymph nodes. VASCULAR: There is predominantly noncalcified atherosclerosis of both carotid systems without high-grade stenosis. LIMITED INTRACRANIAL: Normal. VISUALIZED ORBITS: Normal. MASTOIDS AND VISUALIZED PARANASAL SINUSES: No fluid levels or advanced mucosal thickening. No mastoid effusion. SKELETON: No bony spinal canal stenosis. No lytic or blastic lesions. UPPER CHEST: Clear. OTHER: None. IMPRESSION: 1. No cervical lymphadenopathy. 2. Bilateral predominantly noncalcified carotid bifurcation atherosclerosis without high-grade stenosis. Electronically Signed   By: KUlyses JarredM.D.   On: 01/03/2020 02:49   CT Angio Chest PE W and/or Wo Contrast  Result Date: 12/11/2019 CLINICAL DATA:  [age]year[age] male with concern for pulmonary embolism. Colon cancer. EXAM: CT ANGIOGRAPHY  CHEST WITH CONTRAST TECHNIQUE: Multidetector CT imaging of the chest was performed using the standard protocol during bolus administration of  intravenous contrast. Multiplanar CT image reconstructions and MIPs were obtained to evaluate the vascular anatomy. CONTRAST:  24m OMNIPAQUE IOHEXOL 350 MG/ML SOLN COMPARISON:  Chest CT dated 04/28/2019 and radiograph dated 09/10/2019 FINDINGS: Cardiovascular: There is no cardiomegaly or pericardial effusion. There is coronary vascular calcification and postsurgical changes of CABG. The ascending aorta is enlarged measuring 4.5 cm in diameter similar to prior CT. Evaluation of the aorta is limited due to suboptimal opacification and timing of the contrast. No pulmonary artery embolus identified. Mediastinum/Nodes: No hilar or mediastinal adenopathy. The esophagus and the thyroid gland are grossly unremarkable. No mediastinal fluid collection. Right-sided Port-A-Cath with tip at the cavoatrial junction. Lungs/Pleura: Clusters of ground-glass nodularity in the left lower lobe most concerning for pneumonia versus aspiration. Clinical correlation is recommended. There is a calcified granuloma in the lingula anteriorly. No lobar consolidation, pleural effusion, pneumothorax. The central airways are patent. Small amount of mucus content noted within the distal esophagus and left mainstem bronchus. Upper Abdomen: Multiple hepatic hypodense lesions most consistent with metastatic disease. Multiple gallstones. Partially visualized right renal hypodense lesion, likely a cyst. Musculoskeletal: Osteopenia with degenerative changes of the spine. No acute osseous pathology. Median sternotomy wires. Review of the MIP images confirms the above findings. IMPRESSION: 1. No CT evidence of pulmonary artery embolus. 2. Clusters of ground-glass nodularity in the left lower lobe most concerning for pneumonia versus aspiration. Clinical correlation is recommended. 3. Multiple hepatic metastatic disease. 4. Cholelithiasis. 5. Aortic Atherosclerosis (ICD10-I70.0). Electronically Signed   By: AAnner CreteM.D.   On: 12/11/2019 17:02    CT ABDOMEN PELVIS W CONTRAST  Result Date: 01/03/2020 CLINICAL DATA:  Nausea, vomiting EXAM: CT ABDOMEN AND PELVIS WITH CONTRAST TECHNIQUE: Multidetector CT imaging of the abdomen and pelvis was performed using the standard protocol following bolus administration of intravenous contrast. CONTRAST:  1066mOMNIPAQUE IOHEXOL 300 MG/ML  SOLN COMPARISON:  12/05/2019 FINDINGS: Lower chest: Minimal nodular infiltrate has developed within the a posterior basal segments of the lower lobes bilaterally, possibly reflecting changes related to aspiration, less likely atypical infection. Cardiac size within normal limits. Mild coronary artery calcification noted. Median sternotomy has been performed. Hepatobiliary: Multiple hepatic metastases are again identified and appear grossly stable since prior examination. No intra or extrahepatic biliary ductal dilation. Cholelithiasis again noted. Pancreas: Cystic lesion previously noted within the pancreatic head is not well appreciated on the current examination. The pancreas is mildly atrophic, but is otherwise unremarkable. Spleen: Unremarkable Adrenals/Urinary Tract: The adrenal glands are unremarkable. The kidneys are normal in size and position. Multiple simple cortical cysts are seen bilaterally. 3 mm nonobstructing calculus is seen within the lower pole of the left kidney. No hydronephrosis. No solid intrarenal masses. No ureteral calculi. Eccentric bladder wall thickening involving the a posterior wall the bladder is again noted likely reflecting an underlying urothelial carcinoma. There is high attenuation material within the bladder lumen which likely reflect excreted urinary contrast. Stomach/Bowel: The stomach is fluid-filled. No evidence of gastric outlet obstruction, however. Small bowel is unremarkable. Appendix normal. Palliative stent noted within the a distal sigmoid colon, unchanged from prior examination. Moderate stool seen upstream from the colonic stent  though the caliber of the large bowel is within normal limits. Moderate stool within the rectal vault. No free intraperitoneal gas or fluid. Vascular/Lymphatic: There is central hypodensity within the left common femoral vein suspicious for a a deep  venous thrombus in this location. The abdominal vasculature is otherwise unremarkable. No pathologic adenopathy within the abdomen and pelvis. Reproductive: Prostate is unremarkable. Other: Rectum unremarkable Musculoskeletal: Degenerative changes are seen within the lumbar spine. No lytic or blastic bone lesions are seen. IMPRESSION: Trace inflammatory changes within the visualized lung bases likely related to aspiration. Fluid-filled stomach and given history of vomiting with support this assertion. Numerous hepatic metastases in keeping with metastatic colon cancer, unchanged from prior examination. Colonic stent noted within the sigmoid colon with moderate stool noted upstream throughout the colon without frank dilation to suggest complete obstruction. 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 Cholelithiasis without CT evidence of acute cholecystitis. Known pancreatic cystic lesion is not well visualized on this examination. Aortic Atherosclerosis (ICD10-I70.0). Electronically Signed   By: Fidela Salisbury MD   On: 01/03/2020 03:08   CT Abdomen Pelvis W Contrast  Result Date: 12/06/2019 CLINICAL DATA:  69 year old male with history of colon cancer. Restaging examination. Ongoing chemotherapy. EXAM: CT ABDOMEN AND PELVIS WITH CONTRAST TECHNIQUE: Multidetector CT imaging of the abdomen and pelvis was performed using the standard protocol following bolus administration of intravenous contrast. CONTRAST:  114m OMNIPAQUE IOHEXOL 300 MG/ML  SOLN COMPARISON:  CT the abdomen and pelvis 09/11/2019. FINDINGS: Lower chest: Left atrial appendage ligation clip. Atherosclerotic calcifications in  left anterior descending, left circumflex and right coronary arteries. Postoperative changes of median sternotomy for CABG, including LIMA to the LAD. Calcified granulomas in the anterior aspect of the left upper lobe. Hepatobiliary: Multiple hypovascular hepatic lesions appear increased in number and size compared to the prior examination. The largest of these lesions is in the central aspect of the liver predominantly in segment 4A (axial image 21 of series 2), currently measuring 6.6 x 5.5 cm (previously 5.7 x 3.9 cm). Another prominent lesion in the inferior aspect of the right lobe of the liver between segments 5 and 6 (axial image 33 of series 2), currently 5.4 x 4.0 cm (previously 3.5 x 3.3 cm. Several small new lesions are also noted in segment 2 (axial image 22 of series 2), central aspect of the left lobe of the liver between segments 2 and 3 (axial image 23 of series 2), segment 8 (axial image 16 of series 2), and segment 7 (axial image 24 of series 2). No intra or extrahepatic biliary ductal dilatation. Tiny calcified gallstones in the neck of the gallbladder. No findings to suggest an acute cholecystitis at this time. Pancreas: 1.4 x 1.0 cm low-attenuation lesion in the head of the pancreas (image 35 of series 2), stable compared to the prior study, too small to characterize. No other pancreatic mass. No pancreatic ductal dilatation. No peripancreatic fluid collections or inflammatory changes. Spleen: Unremarkable. Adrenals/Urinary Tract: Multiple low-attenuation lesions in both kidneys, compatible with simple cysts, largest of which is exophytic in the posterior aspect of the upper pole the right kidney measuring 5.5 x 4.1 cm. Other subcentimeter low-attenuation lesions in both kidneys are too small to definitively characterize, but statistically likely to represent tiny cysts. Bilateral adrenal glands are normal in appearance. No hydroureteronephrosis. Asymmetric mural thickening of the urinary  bladder wall, most severe posteriorly where there also appears to be some increased urothelial enhancement, measuring up to 1.4 cm in thickness (sagittal image 70 of series 6). Stomach/Bowel: The appearance of the stomach is normal. No pathologic dilatation of small bowel or colon. Stent traversing an area of stenosis in the distal sigmoid  colon. The appendix is not confidently identified and may be surgically absent. Regardless, there are no inflammatory changes noted adjacent to the cecum to suggest the presence of an acute appendicitis at this time. Vascular/Lymphatic: Mild atherosclerotic disease in the abdominal aorta. No aneurysm or dissection noted in the abdominal or pelvic vasculature. Borderline enlarged peripancreatic lymph node (axial image 34 of series 2), stable compared to prior examination. No other definite pathologically enlarged lymph nodes are noted in the abdomen or pelvis. Reproductive: Prostate gland and seminal vesicles are unremarkable in appearance. Other: No significant volume of ascites.  No pneumoperitoneum. Musculoskeletal: There are no aggressive appearing lytic or blastic lesions noted in the visualized portions of the skeleton. IMPRESSION: 1. Progressive metastatic disease to the liver with increased number and size of numerous hypovascular lesions, as detailed above. 2. No other sites of extra hepatic metastatic disease confidently identified in the abdomen or pelvis. 3. Profound mural thickening in the posterior aspect of the urinary bladder with some urothelial hyperenhancement, increased compared to the prior examination. This may reflect post treatment changes from prior radiation therapy, however, with urologic consultation should be considered for further clinical evaluation. 4. Small cystic lesion in the head of the pancreas, nonspecific, but stable compared to the prior study and favored to represent a small pseudocyst or other benign lesion. Attention at time of routine  follow-up imaging is recommended. 5. Stable position of stent in the sigmoid colon, without evidence of bowel obstruction. 6. Aortic atherosclerosis. 7. Cholelithiasis without evidence of acute cholecystitis at this time. 8. Additional incidental findings, as above. Electronically Signed   By: Vinnie Langton M.D.   On: 12/06/2019 09:10   DG Chest Portable 1 View  Result Date: 01/03/2020 CLINICAL DATA:  Cough, fever, vomiting EXAM: PORTABLE CHEST 1 VIEW COMPARISON:  09/10/2019 FINDINGS: Lungs are clear. No pneumothorax or pleural effusion. Right internal jugular chest port tip seen within the right atrium. Coronary artery bypass grafting has been performed. Cardiac size within normal limits. Pulmonary vascularity is normal. No acute bone abnormality. IMPRESSION: And Electronically Signed   By: Fidela Salisbury MD   On: 01/03/2020 00:24   VAS [place] LOWER EXTREMITY VENOUS (DVT)  Result Date: 01/03/2020  Lower Venous DVT Study Indications: Dvt seen on ct.  Comparison Study: previous done 07/26/19 Performing Technologist: Abram Sander RVS  Examination Guidelines: A complete evaluation includes B-mode imaging, spectral Doppler, color Doppler, and power Doppler as needed of all accessible portions of each vessel. Bilateral testing is considered an integral part of a complete examination. Limited examinations for reoccurring indications may be performed as noted. The reflux portion of the exam is performed with the patient in reverse Trendelenburg.  +---------+---------------+---------+-----------+----------+-------------------+ RIGHT    CompressibilityPhasicitySpontaneityPropertiesThrombus Aging      +---------+---------------+---------+-----------+----------+-------------------+ CFV      Full           Yes      Yes                                      +---------+---------------+---------+-----------+----------+-------------------+ SFJ      Full                                                              +---------+---------------+---------+-----------+----------+-------------------+  FV Prox  Full                                                             +---------+---------------+---------+-----------+----------+-------------------+ FV Mid   Full                                                             +---------+---------------+---------+-----------+----------+-------------------+ FV DistalFull                                                             +---------+---------------+---------+-----------+----------+-------------------+ PFV      Full                                                             +---------+---------------+---------+-----------+----------+-------------------+ POP      Full           Yes      Yes                                      +---------+---------------+---------+-----------+----------+-------------------+ PTV      Full                                                             +---------+---------------+---------+-----------+----------+-------------------+ PERO                                                  Not well visualized +---------+---------------+---------+-----------+----------+-------------------+   +---------+---------------+---------+-----------+----------+-------------------+ LEFT     CompressibilityPhasicitySpontaneityPropertiesThrombus Aging      +---------+---------------+---------+-----------+----------+-------------------+ CFV      None           No       No                   Age Indeterminate   +---------+---------------+---------+-----------+----------+-------------------+ SFJ      Full                                                             +---------+---------------+---------+-----------+----------+-------------------+ FV Prox  Partial  Age Indeterminate    +---------+---------------+---------+-----------+----------+-------------------+ FV Mid   Partial                                      Age Indeterminate   +---------+---------------+---------+-----------+----------+-------------------+ FV DistalPartial                                      Age Indeterminate   +---------+---------------+---------+-----------+----------+-------------------+ PFV      Partial                                      Age Indeterminate   +---------+---------------+---------+-----------+----------+-------------------+ POP      Partial        Yes      Yes                  Age Indeterminate   +---------+---------------+---------+-----------+----------+-------------------+ PTV      Full                                                             +---------+---------------+---------+-----------+----------+-------------------+ PERO                                                  Not well visualized +---------+---------------+---------+-----------+----------+-------------------+    Summary: RIGHT: - There is no evidence of deep vein thrombosis in the lower extremity.  - No cystic structure found in the popliteal fossa.  LEFT: - Findings consistent with age indeterminate deep vein thrombosis involving the left common femoral vein, left femoral vein, left proximal profunda vein, and left popliteal vein. - No cystic structure found in the popliteal fossa.  *See table(s) above for measurements and observations.    Preliminary     PERFORMANCE STATUS (ECOG) : 3 - Symptomatic, >50% confined to bed  Review of Systems Unless otherwise noted, a complete review of systems is negative.  Physical Exam General: NAD, thin, frail-appearing Pulmonary: Unlabored Abdomen: soft, nontender, + bowel sounds GU: no suprapubic tenderness Extremities: no edema, no joint deformities Skin: no rashes Neurological: Weakness but otherwise nonfocal  IMPRESSION: I met with  patient to discuss goals.  I introduced palliative care services and attempted to establish therapeutic rapport.  Patient says that he has been told that he will "die from the cancer" and that he is likely approaching end-of-life.  He also recognizes that he is no longer felt to have viable cancer treatment options.  Hospice has been recommended and we discussed that in detail today.  Patient has been at a rehab center near [place] since he was last hospitalized.  He would not be able to receive hospice at [facility].  Patient admits that he is too frail to return home and really has no social support locally.  I do think the patient would be clinically appropriate for hospice IPU.  Patient stated that he wanted some time to think about options.  He said that he would prefer to be  in a facility in [place] but he admits that he does not know how he would get there.  With patient's permission, I called and spoke with his son Sigurd.  I updated him on patient's current medical problems and answered all of his questions to the best of my ability.  Son stated that he would be in agreement with hospice involvement and feels like a hospice facility would the best option at this point.  Symptomatically, patient is having neck pain and sore throat.  No oral lesions, mucositis, or thrush noted on exam.  Patient is on Magic Mouthwash with nystatin.  Patient has both oxycodone and morphine IV ordered but has not received either.  Discussed with patient requesting pain medications.  Could consider scheduled dosing if needed.  PLAN: -Recommend best supportive care -Recommend hospice IPU -DNR/DNI -Will follow   Time Total: 60 minutes  Visit consisted of counseling and education dealing with the complex and emotionally intense issues of symptom management and palliative care in the setting of serious and potentially life-threatening illness.Greater than 50%  of this time was spent counseling and coordinating care related  to the above assessment and plan.  Signed by: Altha Harm, PhD, NP-C

## 2020-01-04 NOTE — Progress Notes (Signed)
[place] for IV heparin Indication: L DVT  No Known Allergies  Patient Measurements: Height: 6\' 2"  (188 cm) Weight: 83.4 kg (183 lb 12.8 oz) IBW/kg (Calculated) : 82.2 Heparin Dosing Weight: TBW (used [place] for initial dosing)  Vital Signs: Temp: 97.7 F (36.5 C) (12/04 0120) Temp Source: Oral (12/04 0120) BP: 132/81 (12/04 0120) Pulse Rate: 96 (12/04 0120)  Labs: Recent Labs    01/02/20 2215 01/02/20 2215 01/03/20 0803 01/04/20 0315  HGB 10.3*   < > 9.2* 8.8*  HCT 33.7*  --  30.3* 28.5*  PLT 360  --  314 336  HEPARINUNFRC  --   --   --  0.62  CREATININE 1.01  --  1.06 0.82   < > = values in this interval not displayed.    Estimated Creatinine Clearance: 96.1 mL/min (by C-G formula based on SCr of 0.82 mg/dL).   Medical History: Past Medical History:  Diagnosis Date  . CAD (coronary artery disease) 2017   CABG  . Cancer ([place])   . CHF (congestive heart failure) ([place])   . Colitis   . COVID-19 04/29/2019  . Diabetes ([place])   . Paronychia of second toe of right foot   . Throat cancer ([place])     Medications:  Medications Prior to Admission  Medication Sig Dispense Refill Last Dose  . acetaminophen (TYLENOL) 325 MG tablet Take 650 mg by mouth every 6 (six) hours as needed for mild pain.    unk  . ascorbic acid (VITAMIN C) 500 MG tablet Take 1 tablet (500 mg total) by mouth daily.   Past Week at Unknown time  . calcium carbonate (OSCAL) 1500 (600 Ca) MG TABS tablet Take 1,500 mg by mouth 2 (two) times daily as needed (heartburn).    unk  . Cholecalciferol (D3-1000 PO) Take 1,000 Units by mouth daily.    Past Week at Unknown time  . Chromium-Cinnamon [phone] MCG-MG CAPS Take 2,000 mg by mouth daily.   Past Week at Unknown time  . digoxin (LANOXIN) 0.25 MG tablet Take 1 tablet (0.25 mg total) by mouth daily. 30 tablet 1 Past Week at Unknown time  . ferrous sulfate 325 (65 FE) MG EC tablet Take 325 mg by mouth daily with  breakfast.   Past Week at Unknown time  . glipiZIDE (GLUCOTROL) 10 MG tablet Take 10 mg by mouth daily before breakfast.   Past Week at Unknown time  . hyoscyamine (LEVSIN) 0.125 MG tablet Take 1 tablet (0.125 mg total) by mouth every 4 (four) hours as needed for cramping. 30 tablet 0 unk  . Infant Care Products [facility]) OINT Apply 1 application topically daily as needed (protectant).    unk  . levalbuterol (XOPENEX HFA) 45 MCG/ACT inhaler Inhale 2 puffs into the lungs every 6 (six) hours as needed for wheezing. 1 Inhaler 0 unk  . loperamide (IMODIUM) 2 MG capsule Take 1 capsule by mouth 4 (four) times daily as needed for diarrhea or loose stools.    unk  . magnesium oxide (MAG-OX) 400 MG tablet Take 800 mg by mouth daily.    Past Week at Unknown time  . midodrine (PROAMATINE) 10 MG tablet Take 1 tablet (10 mg total) by mouth 3 (three) times daily with meals.   Past Week at Unknown time  . Multiple Vitamin (MULTIVITAMIN WITH MINERALS) TABS tablet Take 1 tablet by mouth daily.   Past Week at Unknown time  . Multiple Vitamins-Minerals (DECUBI-VITE) CAPS Take 1 capsule  by mouth daily.   Past Week at Unknown time  . omeprazole (PRILOSEC) 20 MG capsule Take 1 capsule (20 mg total) by mouth 2 (two) times daily. 60 capsule 1 Past Week at Unknown time  . PROPYLENE GLYCOL OP Place 1 drop into both eyes as needed (dry eyes).    unk  . Turmeric 500 MG CAPS Take 1,000 mg by mouth 2 (two) times daily.    Past Week at Unknown time  . vitamin B-12 (CYANOCOBALAMIN) 500 MCG tablet Take 500 mcg by mouth daily.   Past Week at Unknown time   Scheduled:  . Chlorhexidine Gluconate Cloth  6 each Topical Daily  . docusate sodium  100 mg Oral BID  . insulin aspart  0-9 Units Subcutaneous Q4H  . pantoprazole (PROTONIX) IV  40 mg Intravenous Q24H  . polyethylene glycol  17 g Oral Daily   Infusions:  . ampicillin-sulbactam (UNASYN) IV Stopped (01/03/20 2207)  . chlorproMAZINE (THORAZINE) IV    . heparin 1,450  Units/hr (01/04/20 0300)  . 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 CTa/p suggestive of DVT, and vascular duplex reveals L DVT. Pharmacy consulted to dose heparin IV.  During 12/11/19 admission, noted to have "one episode of emesis that looked like coffee-ground. FOBT positive. Hemoglobin at baseline. Recent upper GI endoscopy was normal. He is not on any PPI. Started on PPI. Currently no indication for endoscopic evaluation."   Baseline INR, aPTT: not done  Prior anticoagulation: none d/t recent hematemesis; enoxaparin 40 mg at ~10a today  Significant events:  Today, 01/04/2020:  Initial heparin level 0.62- therapeutic on 1450 units/hr  CBC: Hgb low in setting of CA and recent FOLFIRI in November, pltc WNL  SCr stable WNL (at baseline)  No bleeding or infusion issues per nursing  Goal of Therapy: Heparin level 0.3-0.7 units/ml Monitor platelets by anticoagulation protocol: Yes  Plan:  Continue Heparin 1450 units/hr IV infusion  Recheck heparin level in 8 hrs   Daily CBC & heparin level while on heparin  Monitor for signs of bleeding or worsening thrombosis  Netta Cedars, PharmD, BCPS 01/04/2020, 3:38 AM

## 2020-01-04 NOTE — Progress Notes (Signed)
   01/04/20 2233  Assess: MEWS Score  Temp 97.7 F (36.5 C)  BP 121/73  Pulse Rate (!) 114  Resp 18  SpO2 92 %  O2 Device Room Air  Patient Activity (if Appropriate) In bed  Assess: MEWS Score  MEWS Temp 0  MEWS Systolic 0  MEWS Pulse 2  MEWS RR 0  MEWS LOC 0  MEWS Score 2  MEWS Score Color Yellow  Assess: if the MEWS score is Yellow or Red  Were vital signs taken at a resting state? Yes  Focused Assessment Change from prior assessment (see assessment flowsheet)  Early Detection of Sepsis Score *See Row Information* Medium  MEWS guidelines implemented *See Row Information* Yes  Treat  MEWS Interventions Other (Comment) (pt is comfort care under MD note but no orders )  Pain Scale 0-10  Pain Score 5  Pain Type Acute pain  Pain Location Throat  Pain Descriptors / Indicators Sore  Pain Frequency Constant  Pain Onset On-going  Patients Stated Pain Goal 3  Pain Intervention(s) Medication (See eMAR)  Take Vital Signs  Increase Vital Sign Frequency  Yellow: Q 2hr X 2 then Q 4hr X 2, if remains yellow, continue Q 4hrs  Escalate  MEWS: Escalate Yellow: discuss with charge nurse/RN and consider discussing with provider and RRT  Notify: Charge Nurse/RN  Name of Charge Nurse/RN Notified vera W  Date Charge Nurse/RN Notified 01/04/20  Time Charge Nurse/RN Notified 2235  Document  Patient Outcome Stabilized after interventions  Progress note created (see row info) Yes  patient is DNR and comfort care per MD's progress note but no actual order in place. Patient is asymptomatic and resting in bed. Will follow MEWS protocol

## 2020-01-04 NOTE — Progress Notes (Signed)
SLP Cancellation Note  Patient Details Name: Clarence Andrews. MRN: [id number] DOB: 03-16-1950   Cancelled treatment:       Reason Eval/Treat Not Completed: Patient's level of consciousness. Patient lethargic and minimally alert. He is currently on full liquids diet. SLP will attempt next date if staffing permits.  Sonia Baller, MA, CCC-SLP Speech Therapy

## 2020-01-04 NOTE — Progress Notes (Signed)
[facility] Progress Note  Patient: Clarence Andrews.    FIE:[id number]  DOA: 01/02/2020     Date of Service: the patient was seen and examined on 01/04/2020  Brief hospital course: History of head and neck cancer, colorectal cancer with metastasis, CAD, type II DM, C. difficile colitis, PAF.  Presents with complaints of nausea and vomiting found to have constipation with severe electrolyte imbalance.  Also had DVT.  CT scan also shows progression of the cancer with worsening metastasis. Currently plan is conservative measures with plan to consider residential hospice transfer.  Assessment and Plan: 1.  Intractable nausea and vomiting likely from constipation. CT abdomen negative for any obstruction. Currently passing gas.  No nausea no vomiting since this whole holiday.  Continue bowel regimen. Nausea and vomiting currently have resolved.  2.  Colon cancer with liver metastasis Progressive despite 12 cycles of chemotherapy. Also has stent placement. Discussed with patient regarding poor prognosis. Palliative care consulted. Oncology will be following as well. Per discussion between palliative care and patient on 01/04/2020 patient will be transitioned to hospice and comfort care.  3.  Aspiration pneumonitis versus aspiration pneumonia Currently not hypoxic. Bilateral crackles. On IV Unasyn. Monitor.  Now comfort care  4.  Diabetes mellitus Now comfort care.  Hold medications.  5.  History of orthostatic hypotension. Currently blood pressure rather elevated.  Will hold midodrine.  6.  Paroxysmal A. fib. Resume digoxin. Not on any anticoagulation.  7.  Age indeterminant DVT. CT scan was positive for common iliac DVT. Doppler positive for left common femoral DVT. Initiating heparin. Discussed with patient that he is at risk for bleeding secondary to his colon cancer history.  8.  Sacral stage I pressure ulcer. POA. Monitor.  9.  Chest pain. Troponin negative.   Chest x-ray negative.  Likely GI in nature. Continue pain control.  10.  Odynophagia. Patient reports throat pain while trying to swallow anything. Magic mouthwash with lidocaine.  Body mass index is 23.6 kg/m.    Interventions:    Pressure Injury 01/03/20 Sacrum Left Stage 1 -  Intact skin with non-blanchable redness of a localized area usually over a bony prominence. (Active)  01/03/20 1718  Location: Sacrum  Location Orientation: Left  Staging: Stage 1 -  Intact skin with non-blanchable redness of a localized area usually over a bony prominence.  Wound Description (Comments):   Present on Admission: Yes     Diet: Soft diet DVT Prophylaxis: Therapeutic anticoagulation with heparin     Advance goals of care discussion: Full code  Family Communication: no family was present at bedside, at the time of interview.   Disposition:  Status is: Inpatient  Remains inpatient appropriate because:IV treatments appropriate due to intensity of illness or inability to take PO   Dispo: The patient is from: Home              Anticipated d/c is to: Residential hospice              Anticipated d/c date is: 3 days              Patient currently is not medically stable to d/c.  Subjective: No nausea no vomiting.  Reports some chest pain later.  No fever no chills.  Physical Exam:  General: Appear in mild distress, no Rash; Oral Mucosa Clear, moist. no Abnormal Neck Mass Or lumps, Conjunctiva normal  Cardiovascular: S1 and S2 Present, no Murmur, Respiratory: good respiratory effort, Bilateral Air entry present and CTA,  no Crackles, no wheezes Abdomen: Bowel Sound present, Soft and no tenderness Extremities: no Pedal edema Neurology: alert and oriented to time, place, and person affect appropriate. no new focal deficit Gait not checked due to patient safety concerns   Vitals:   01/04/20 0120 01/04/20 0539 01/04/20 1056 01/04/20 1433  BP: 132/81 134/86 138/88 138/75  Pulse: 96 (!)  101 (!) 102 100  Resp: 14 14  18   Temp: 97.7 F (36.5 C) (!) 97.2 F (36.2 C)  97.9 F (36.6 C)  TempSrc: Oral Oral  Oral  SpO2: 98% 98%    Weight:      Height:        Intake/Output Summary (Last 24 hours) at 01/04/2020 1916 Last data filed at 01/04/2020 1909 Gross per 24 hour  Intake 2814.79 ml  Output 1400 ml  Net 1414.79 ml   Filed Weights   01/02/20 2142 01/03/20 1710  Weight: 83.1 kg 83.4 kg    Data Reviewed: I have personally reviewed and interpreted daily labs, tele strips, imagings as discussed above. I reviewed all nursing notes, pharmacy notes, vitals, pertinent old records I have discussed plan of care as described above with RN and patient/family.  CBC: Recent Labs  Lab 01/02/20 2215 01/03/20 0803 01/04/20 0315  WBC 13.9* 13.9* 16.0*  NEUTROABS 11.9* 12.5*  --   HGB 10.3* 9.2* 8.8*  HCT 33.7* 30.3* 28.5*  MCV 85.3 85.8 84.8  PLT 360 314 865   Basic Metabolic Panel: Recent Labs  Lab 01/02/20 2215 01/03/20 0803 01/04/20 0315  NA 139 140 137  K 3.5 3.8 3.2*  CL 98 101 100  CO2 26 27 27   GLUCOSE 224* 267* 112*  BUN 19 20 15   CREATININE 1.01 1.06 0.82  CALCIUM 8.8* 8.3* 8.0*  MG  --  1.4* 1.5*    Studies: DG CHEST PORT 1 VIEW  Result Date: 01/04/2020 CLINICAL DATA:  History of head neck cancer. History of colorectal cancer with metastases. EXAM: PORTABLE CHEST 1 VIEW COMPARISON:  January 03, 2020 FINDINGS: Stable right Port-A-Cath. No pneumothorax. The lungs are clear. The heart, hila, mediastinum are unremarkable. IMPRESSION: No active disease. Electronically Signed   By: Dorise Bullion III M.D   On: 01/04/2020 13:47    Scheduled Meds: . Chlorhexidine Gluconate Cloth  6 each Topical Daily  . digoxin  0.25 mg Oral Daily  . docusate sodium  100 mg Oral BID  . insulin aspart  0-9 Units Subcutaneous Q4H  . magic mouthwash  15 mL Oral QID  . pantoprazole (PROTONIX) IV  40 mg Intravenous Q24H  . polyethylene glycol  17 g Oral Daily    Continuous Infusions: . ampicillin-sulbactam (UNASYN) IV Stopped (01/04/20 1718)  . chlorproMAZINE (THORAZINE) IV    . heparin 1,450 Units/hr (01/04/20 1909)  . lactated ringers 75 mL/hr at 01/04/20 1909   PRN Meds: acetaminophen, bisacodyl, chlorproMAZINE (THORAZINE) IV, lidocaine, morphine injection, ondansetron **OR** ondansetron (ZOFRAN) IV, oxyCODONE-acetaminophen  Time spent: 35 minutes  Author: Berle Mull, MD [facility] 01/04/2020 7:16 PM  To reach On-call, see care teams to locate the attending and reach out via [email or web address]. Between 7PM-7AM, please contact night-coverage If you still have difficulty reaching the attending provider, please page the [facility] (Director on Call) for [facility] on amion for assistance.

## 2020-01-05 DIAGNOSIS — C187 Malignant neoplasm of sigmoid colon: Secondary | ICD-10-CM | POA: Diagnosis not present

## 2020-01-05 LAB — GLUCOSE, CAPILLARY
Glucose-Capillary: 105 mg/dL — ABNORMAL HIGH (ref 70–99)
Glucose-Capillary: 113 mg/dL — ABNORMAL HIGH (ref 70–99)
Glucose-Capillary: 113 mg/dL — ABNORMAL HIGH (ref 70–99)
Glucose-Capillary: 103 mg/dL — ABNORMAL HIGH (ref 70–99)
Glucose-Capillary: 101 mg/dL — ABNORMAL HIGH (ref 70–99)
Glucose-Capillary: 119 mg/dL — ABNORMAL HIGH (ref 70–99)

## 2020-01-05 LAB — CBC
HCT: 25.2 % — ABNORMAL LOW (ref 39.0–52.0)
Hemoglobin: 7.7 g/dL — ABNORMAL LOW (ref 13.0–17.0)
MCH: 26 pg (ref 26.0–34.0)
MCHC: 30.6 g/dL (ref 30.0–36.0)
MCV: 85.1 fL (ref 80.0–100.0)
Platelets: 238 10*3/uL (ref 150–400)
RBC: 2.96 MIL/uL — ABNORMAL LOW (ref 4.22–5.81)
RDW: 16.3 % — ABNORMAL HIGH (ref 11.5–15.5)
WBC: 14.7 10*3/uL — ABNORMAL HIGH (ref 4.0–10.5)
nRBC: 0 % (ref 0.0–0.2)

## 2020-01-05 LAB — BASIC METABOLIC PANEL
Anion gap: 10 (ref 5–15)
BUN: 17 mg/dL (ref 8–23)
CO2: 28 mmol/L (ref 22–32)
Calcium: 7.9 mg/dL — ABNORMAL LOW (ref 8.9–10.3)
Chloride: 101 mmol/L (ref 98–111)
Creatinine, Ser: 1.11 mg/dL (ref 0.61–1.24)
GFR, Estimated: >60 mL/min (ref >60)
Glucose, Bld: 106 mg/dL — ABNORMAL HIGH (ref 70–99)
Potassium: 3.8 mmol/L (ref 3.5–5.1)
Sodium: 139 mmol/L (ref 135–145)

## 2020-01-05 LAB — HEPARIN LEVEL (UNFRACTIONATED): Heparin Unfractionated: 0.42 IU/mL (ref 0.30–0.70)

## 2020-01-05 MED ORDER — RIVAROXABAN 15 MG PO TABS
15.0000 mg | ORAL_TABLET | ORAL | Status: AC
  Administered 2020-01-05: 15 mg via ORAL
  Filled 2020-01-05: qty 1

## 2020-01-05 MED ORDER — RIVAROXABAN 20 MG PO TABS: 20.0000 mg | ORAL_TABLET | Freq: Every day | ORAL | Status: DC

## 2020-01-05 MED ORDER — RIVAROXABAN 15 MG PO TABS
15.0000 mg | ORAL_TABLET | Freq: Two times a day (BID) | ORAL | Status: DC
  Administered 2020-01-05 – 2020-01-07 (×4): 15 mg via ORAL
  Filled 2020-01-05 (×4): qty 1

## 2020-01-05 MED ORDER — MUSCLE RUB 10-15 % EX CREA
TOPICAL_CREAM | CUTANEOUS | Status: DC | PRN
  Administered 2020-01-05: 1 via TOPICAL
  Filled 2020-01-05: qty 85

## 2020-01-05 NOTE — Progress Notes (Signed)
[place] for IV heparin > PO Xarelto Indication: L DVT  No Known Allergies  Patient Measurements: Height: 6\' 2"  (188 cm) Weight: 83.4 kg (183 lb 12.8 oz) IBW/kg (Calculated) : 82.2 Heparin Dosing Weight: TBW   Vital Signs: Temp: 97.7 F (36.5 C) (12/05 1041) Temp Source: Oral (12/05 1041) BP: 98/67 (12/05 1041) Pulse Rate: 126 (12/05 1041)  Labs: Recent Labs    01/03/20 0803 01/03/20 0803 01/04/20 0315 01/04/20 1120 01/04/20 1237 01/05/20 0402  HGB 9.2*   < > 8.8*  --   --  7.7*  HCT 30.3*  --  28.5*  --   --  25.2*  PLT 314  --  336  --   --  238  HEPARINUNFRC  --   --  0.62 0.61  --  0.42  CREATININE 1.06  --  0.82  --   --  1.11  TROPONINIHS  --   --   --   --  23*  --    < > = values in this interval not displayed.    Estimated Creatinine Clearance: 71 mL/min (by C-G formula based on SCr of 1.11 mg/dL).  Medical History: Past Medical History:  Diagnosis Date  . CAD (coronary artery disease) 2017   CABG  . Cancer ([place])   . CHF (congestive heart failure) ([place])   . Colitis   . COVID-19 04/29/2019  . Diabetes ([place])   . Paronychia of second toe of right foot   . Throat cancer [facility])     Assessment: 31 yoM with PMH DM2, PAF not on anticoagulation d/t hx hematemesis, colon CA metastatic to liver, admitted 12/2 for intractable vomiting. CTa/p suggestive of DVT, and vascular duplex reveals L DVT. Pharmacy consulted to dose heparin IV.  During 12/11/19 admission, noted to have "one episode of emesis that looked like coffee-ground. FOBT positive. Hemoglobin at baseline. Recent upper GI endoscopy was normal. He is not on any PPI. Started on PPI. Currently no indication for endoscopic evaluation."   Baseline INR, aPTT: not done  Prior anticoagulation: none d/t recent hematemesis  Today, 01/05/2020:  Asked to transition patient to Xarelto  CBC: Hgb (7.7) low and decreased. Pltc WNL.  Note most recent chemo on  12/09/19  SCr WNL (at baseline), CrCl ~ 72 ml/min using TBW  No bleeding issues noted per nursing    Plan:  Stop heparin infusion now  At same time, start Xarelto 15mg  PO BID with meals x 21 days, then 20mg  PO daily with supper thereafter  Monitor CBC and for s/sx of bleeding  Pharmacy to provide education prior to discharge   Lindell Spar, PharmD, BCPS 01/05/20 @ 12:13 PM

## 2020-01-05 NOTE — Progress Notes (Signed)
[facility] Progress Note  Patient: Clarence Andrews.    PPI:[id number]  DOA: 01/02/2020     Date of Service: the patient was seen and examined on 01/05/2020  Brief hospital course: History of head and neck cancer, colorectal cancer with metastasis, CAD, type II DM, C. difficile colitis, PAF.  Presents with complaints of nausea and vomiting found to have constipation with severe electrolyte imbalance.  Also had DVT.  CT scan also shows progression of the cancer with worsening metastasis. Currently plan is conservative measures with plan to transition back to SNF.  Assessment and Plan: 1.  Intractable nausea and vomiting likely from constipation. CT abdomen negative for any obstruction. Currently passing gas.  No nausea no vomiting since this whole holiday.  Continue bowel regimen. Nausea and vomiting currently have resolved.  2.  Colon cancer with liver metastasis Goals of care conversation. Progressive despite 12 cycles of chemotherapy. Also has stent placement. Discussed with patient regarding poor prognosis. Palliative care consulted. Oncology will be following as well. Per discussion between palliative care and patient on 01/04/2020 patient will be transitioned to hospice and comfort care.  A discussion of with patient on 01/05/2040. Patient does not want to transition to hospice. Wants to go to SNF and wants to get rehab. Eventual plan for patient is to meet back with the family in [place]. Continue palliative care consultation at SNF. This was discussed with Dr. Domingo Cocking as well as Education officer, museum.  3.  Aspiration pneumonitis versus aspiration pneumonia Currently not hypoxic. Bilateral crackles. On IV Unasyn. Monitor.    4.  Diabetes mellitus Now comfort care.  Hold medications.  5.  History of orthostatic hypotension. Currently blood pressure rather elevated.  Will hold midodrine.  6.  Paroxysmal A. fib. Resume digoxin. Not on any anticoagulation.  7.  Age  indeterminant DVT. CT scan was positive for common iliac DVT. Doppler positive for left common femoral DVT. Initiated heparin.  Now transition to [place]. Discussed with patient that he is at risk for bleeding secondary to his colon cancer history.  8.  Sacral stage I pressure ulcer. POA. Monitor.  9.  Chest pain. Troponin negative.  Chest x-ray negative.  Likely GI in nature. Continue pain control.  10.  Odynophagia. Patient reports throat pain while trying to swallow anything. Magic mouthwash with lidocaine.  Body mass index is 23.6 kg/m.    Interventions:    Pressure Injury 01/03/20 Sacrum Left Stage 1 -  Intact skin with non-blanchable redness of a localized area usually over a bony prominence. (Active)  01/03/20 1718  Location: Sacrum  Location Orientation: Left  Staging: Stage 1 -  Intact skin with non-blanchable redness of a localized area usually over a bony prominence.  Wound Description (Comments):   Present on Admission: Yes     Diet: Soft diet DVT Prophylaxis: Therapeutic anticoagulation with heparin   Advance goals of care discussion: DNR DNI  Family Communication: no family was present at bedside, at the time of interview.   Disposition:  Status is: Inpatient  Remains inpatient appropriate because:IV treatments appropriate due to intensity of illness or inability to take PO   Dispo: The patient is from: Home              Anticipated d/c is to: SNF              Anticipated d/c date is: 1 to 2 days.              Patient currently is not  medically stable to d/c.  Subjective: No nausea no vomiting.  No fever no chills.  No chest pain. Physical Exam:  General: Appear in mild distress, no Rash; Oral Mucosa Clear, moist. no Abnormal Neck Mass Or lumps, Conjunctiva normal  Cardiovascular: S1 and S2 Present, no Murmur, Respiratory: good respiratory effort, Bilateral Air entry present and CTA, no Crackles, no wheezes Abdomen: Bowel Sound present, Soft and  no tenderness Extremities: no Pedal edema Neurology: alert and oriented to time, place, and person affect appropriate. no new focal deficit Gait not checked due to patient safety concerns   Vitals:   01/05/20 0627 01/05/20 1041 01/05/20 1500 01/05/20 1853  BP: (!) 86/61 98/67 118/79 114/77  Pulse: (!) 124 (!) 126 (!) 128 (!) 129  Resp: 18 18 17 18   Temp: (!) 97.5 F (36.4 C) 97.7 F (36.5 C) (!) 97.5 F (36.4 C) 97.6 F (36.4 C)  TempSrc: Oral Oral Oral Oral  SpO2: 90% 96% 96% 96%  Weight:      Height:        Intake/Output Summary (Last 24 hours) at 01/05/2020 1932 Last data filed at 01/05/2020 1541 Gross per 24 hour  Intake 863.5 ml  Output 100 ml  Net 763.5 ml   Filed Weights   01/02/20 2142 01/03/20 1710  Weight: 83.1 kg 83.4 kg    Data Reviewed: I have personally reviewed and interpreted daily labs, tele strips, imagings as discussed above. I reviewed all nursing notes, pharmacy notes, vitals, pertinent old records I have discussed plan of care as described above with RN and patient/family.  CBC: Recent Labs  Lab 01/02/20 2215 01/03/20 0803 01/04/20 0315 01/05/20 0402  WBC 13.9* 13.9* 16.0* 14.7*  NEUTROABS 11.9* 12.5*  --   --   HGB 10.3* 9.2* 8.8* 7.7*  HCT 33.7* 30.3* 28.5* 25.2*  MCV 85.3 85.8 84.8 85.1  PLT 360 314 336 950   Basic Metabolic Panel: Recent Labs  Lab 01/02/20 2215 01/03/20 0803 01/04/20 0315 01/05/20 0402  NA 139 140 137 139  K 3.5 3.8 3.2* 3.8  CL 98 101 100 101  CO2 26 27 27 28   GLUCOSE 224* 267* 112* 106*  BUN 19 20 15 17   CREATININE 1.01 1.06 0.82 1.11  CALCIUM 8.8* 8.3* 8.0* 7.9*  MG  --  1.4* 1.5*  --     Studies: No results found.  Scheduled Meds: . Chlorhexidine Gluconate Cloth  6 each Topical Daily  . digoxin  0.25 mg Oral Daily  . docusate sodium  100 mg Oral BID  . magic mouthwash  15 mL Oral QID  . pantoprazole (PROTONIX) IV  40 mg Intravenous Q24H  . polyethylene glycol  17 g Oral Daily  . Rivaroxaban   15 mg Oral BID WC   Followed by  . [START ON 01/26/2020] rivaroxaban  20 mg Oral Q supper   Continuous Infusions: . ampicillin-sulbactam (UNASYN) IV 3 g (01/05/20 1541)  . chlorproMAZINE (THORAZINE) IV    . lactated ringers 75 mL/hr at 01/04/20 1909   PRN Meds: acetaminophen, bisacodyl, chlorproMAZINE (THORAZINE) IV, lidocaine, morphine injection, Muscle Rub, ondansetron **OR** ondansetron (ZOFRAN) IV, oxyCODONE-acetaminophen  Time spent: 35 minutes  Author: Berle Mull, MD [facility] 01/05/2020 7:32 PM  To reach On-call, see care teams to locate the attending and reach out via [email or web address]. Between 7PM-7AM, please contact night-coverage If you still have difficulty reaching the attending provider, please page the [facility] (Director on Call) for [facility] on amion for assistance.

## 2020-01-05 NOTE — Evaluation (Signed)
Clinical/Bedside Swallow Evaluation Patient Details  Name: Clarence Andrews. MRN: [id number] Date of Birth: 02/12/1950  Today's Date: 01/05/2020 Time: SLP Start Time (ACUTE ONLY): 1605 SLP Stop Time (ACUTE ONLY): 1630 SLP Time Calculation (min) (ACUTE ONLY): 25 min  Past Medical History:  Past Medical History:  Diagnosis Date  . CAD (coronary artery disease) 2017   CABG  . Cancer ([place])   . CHF (congestive heart failure) ([place])   . Colitis   . COVID-19 04/29/2019  . Diabetes ([place])   . Paronychia of second toe of right foot   . Throat cancer [facility])    Past Surgical History:  Past Surgical History:  Procedure Laterality Date  . APPENDECTOMY  1970  . COLONIC STENT PLACEMENT N/A 07/12/2019   Procedure: COLONIC STENT PLACEMENT;  Surgeon: Irving Copas., MD;  Location: [facility];  Service: Gastroenterology;  Laterality: N/A;  . CORONARY ARTERY BYPASS GRAFT     5 vessel   . FLEXIBLE SIGMOIDOSCOPY N/A 07/12/2019   Procedure: FLEXIBLE SIGMOIDOSCOPY;  Surgeon: Rush Landmark Telford Nab., MD;  Location: [facility];  Service: Gastroenterology;  Laterality: N/A;  . IR IMAGING GUIDED PORT INSERTION  04/04/2019  . TOOTH EXTRACTION     HPI:  Clarence Andrews. is a 69 y.o. male with medical history significant of DM2, orthostatic hypotension, PAF.  Throat CA in remission, Metastatic colon CA with mets to liver, s/p palliative stent to colon, 12 cycles of FOLFOX and the cancer progressed further based on CT earlier this month. Patient presents to the ED with c/o 5 episodes of N/V today.  Also has diarrhea ongoing for weeks.  Emesis is NBNB.  No abd pain he reports. Also has L sided neck pain, sore throat, and dysphagia for past few weeks.  Sharp and stabbing pain.  Worse with eating and drinking.  Has some lesions on tongue, concerned his throat CA may be coming back. CT head/neck WNL.   Assessment / Plan / Recommendation Clinical Impression   [name] was seen for a clinical swallow  evaluation in setting of intractable vomiting an dysphagia/odynophagia ongoing over past two weeks. He has metastatic colon CA with h/o throat cancer (in remission) s/p XRT to head/neck.  His greatest complaint at this time is for esophageal pain/burning after the swallow. He gestures mid-sternum, concerning for esophagitis. He is being treated for thrush. He has been on a full liquid diet and CXR is WNL. Oral mech exam is WNL with no asymmetry or abnormalities noted. No thrush/coating noted on lingual body. He is edentulous and does not have dentures here at hospital. He was seen with thin liquids via cup and straw with immediate and delayed wet/weak cough response. He also makes hard grimace with each swallow and reports it is 8/10 pain. He had the same pain/grimace with purees as well as a delayed wet cough. He requested to defer regular solid trials at this time given pain level with swallows.  Given history of throat CA, s/s aspiration, and generalized weakness, suspect pt may have a pharyngeal/esophageal dysphagia. Options were discussed with patient regarding swallow function, NPO/PO, alternative means of nutrition, altered consistencies. Pt reports that he knows he is dying and wants to be able to eat (not amenable to NPO status), but that he is open to "his options" otherwise. He would like to proceed with a swallow study to see if any consistencies pass more easily than others.   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 Continue with thin liquids and purees at this time, given his wishes to not be NPO. Treatment program and further diet recommendations to be determined after objective swallow study. Pt in agreement with plan.    SLP Visit Diagnosis: Dysphagia, unspecified (R13.10)    Aspiration Risk  Moderate aspiration risk    Diet Recommendation Dysphagia 1 (Puree);Thin liquid    Medication Administration: Crushed with puree Supervision: Patient able to self feed Compensations: Small sips/bites;Follow solids with liquid Postural Changes: Seated upright at 90 degrees;Remain upright for at least 30 minutes after po intake    Other  Recommendations Recommended Consults: Consider ENT evaluation Oral Care Recommendations: Oral care BID;Patient independent with oral care   Follow up Recommendations 24 hour supervision/assistance;Skilled Nursing facility;Other (comment) (hospice)      Frequency and Duration min 2x/week  1 week       Prognosis Prognosis for Safe Diet Advancement: Guarded Barriers to Reach Goals: Severity of deficits      Swallow Study   General Date of Onset: 12/20/19 HPI: Clarence Andrews. is a 69 y.o. male with medical history significant of DM2, orthostatic hypotension, PAF.  Throat CA in remission, Metastatic colon CA with mets to liver, s/p palliative stent to colon, 12 cycles of FOLFOX and the cancer progressed further based on CT earlier this month. Patient presents to the ED with c/o 5 episodes of N/V today.  Also has diarrhea ongoing for weeks.  Emesis is NBNB.  No abd pain he reports. Also has L sided neck pain, sore throat, and dysphagia for past few weeks.  Sharp and stabbing pain.  Worse with eating and drinking.  Has some lesions on tongue, concerned his throat CA may be coming back. CT head/neck WNL. Type of Study: Bedside Swallow Evaluation Previous Swallow Assessment: 09/11/19 CSE recommending regular solids and thin liquids Diet Prior to this Study: Thin liquids;Other (Comment) (full liquids) Temperature Spikes Noted: No Respiratory Status: Nasal cannula History of Recent Intubation: No Behavior/Cognition: Alert;Cooperative;Pleasant mood Oral Cavity Assessment: Within Functional Limits Oral Care Completed by SLP: No Oral Cavity - Dentition: Edentulous Self-Feeding Abilities: Able to feed self Patient Positioning: Upright in  bed Baseline Vocal Quality: Hoarse Volitional Cough: Weak;Wet;Congested Volitional Swallow: Able to elicit    Oral/Motor/Sensory Function Overall Oral Motor/Sensory Function: Within functional limits   Thin Liquid Thin Liquid: Impaired Pharyngeal  Phase Impairments: Cough - Immediate;Wet Vocal Quality;Multiple swallows    Puree Puree: Impaired Presentation: Self Fed Pharyngeal Phase Impairments: Cough - Delayed;Wet Vocal Quality;Multiple swallows   Solid     Solid: Not tested Other Comments: deferred given odynophagia     Alivya Wegman P. Jakita Dutkiewicz, M.S., CCC-SLP Speech-Language Pathologist Acute Rehabilitation Services Pager: [place] 01/05/2020,4:29 PM

## 2020-01-05 NOTE — Progress Notes (Signed)
[place] for IV heparin Indication: L DVT  No Known Allergies  Patient Measurements: Height: 6\' 2"  (188 cm) Weight: 83.4 kg (183 lb 12.8 oz) IBW/kg (Calculated) : 82.2 Heparin Dosing Weight: n/a. Use TBW   Vital Signs: Temp: 98.2 F (36.8 C) (12/05 0236) Temp Source: Oral (12/05 0236) BP: 99/69 (12/05 0236) Pulse Rate: 102 (12/05 0236)  Labs: Recent Labs    01/03/20 0803 01/03/20 0803 01/04/20 0315 01/04/20 1120 01/04/20 1237 01/05/20 0402  HGB 9.2*   < > 8.8*  --   --  7.7*  HCT 30.3*  --  28.5*  --   --  25.2*  PLT 314  --  336  --   --  238  HEPARINUNFRC  --   --  0.62 0.61  --  0.42  CREATININE 1.06  --  0.82  --   --  1.11  TROPONINIHS  --   --   --   --  23*  --    < > = values in this interval not displayed.    Estimated Creatinine Clearance: 71 mL/min (by C-G formula based on SCr of 1.11 mg/dL).  Medical History: Past Medical History:  Diagnosis Date  . CAD (coronary artery disease) 2017   CABG  . Cancer ([place])   . CHF (congestive heart failure) ([place])   . Colitis   . COVID-19 04/29/2019  . Diabetes ([place])   . Paronychia of second toe of right foot   . 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 CTa/p suggestive of DVT, and vascular duplex reveals L DVT. Pharmacy consulted to dose heparin IV.  During 12/11/19 admission, noted to have "one episode of emesis that looked like coffee-ground. FOBT positive. Hemoglobin at baseline. Recent upper GI endoscopy was normal. He is not on any PPI. Started on PPI. Currently no indication for endoscopic evaluation."   Baseline INR, aPTT: not done  Prior anticoagulation: none d/t recent hematemesis;   Today, 01/05/2020:  HL = 0.42 remains therapeutic on heparin infusion of 1450 units/hr  CBC: Hgb (7.7) low and slightly decreased. Plt WNL.  Note most  recent chemo on 12/09/19  SCr stable WNL (at baseline)  Confirmed with RN that heparin infusing at correct rate. No signs of bleeding reported.  Goal of Therapy: Heparin level 0.3-0.7 units/ml Monitor platelets by anticoagulation protocol: Yes  Plan:  Continue heparin infusion at current rate of 1450 units/hr  Daily CBC & heparin level while on heparin  Monitor for signs of bleeding  Netta Cedars, PharmD 01/05/20 @ 4:50 AM

## 2020-01-05 NOTE — Discharge Instructions (Addendum)
Information on my medicine - ELIQUIS (apixaban)  This medication education was reviewed with me or my healthcare representative as part of my discharge preparation.    Why was Eliquis prescribed for you? Eliquis was prescribed to treat blood clots that may have been found in the veins of your legs (deep vein thrombosis) or in your lungs (pulmonary embolism) and to reduce the risk of them occurring again.  What do You need to know about Eliquis ? The starting dose is 5 mg (one 5 mg tablets) taken TWICE daily for the FIRST SEVEN (7) DAYS, then on  01/11/20  the dose is reduced to ONE 2.5 mg tablet taken TWICE daily.  Eliquis may be taken with or without food.   Try to take the dose about the same time in the morning and in the evening. If you have difficulty swallowing the tablet whole please discuss with your pharmacist how to take the medication safely.  Take Eliquis exactly as prescribed and DO NOT stop taking Eliquis without talking to the doctor who prescribed the medication.  Stopping may increase your risk of developing a new blood clot.  Refill your prescription before you run out.  After discharge, you should have regular check-up appointments with your healthcare provider that is prescribing your Eliquis.    What do you do if you miss a dose? If a dose of ELIQUIS is not taken at the scheduled time, take it as soon as possible on the same day and twice-daily administration should be resumed. The dose should not be doubled to make up for a missed dose.  Important Safety Information A possible side effect of Eliquis is bleeding. You should call your healthcare provider right away if you experience any of the following: ? Bleeding from an injury or your nose that does not stop. ? Unusual colored urine (red or dark brown) or unusual colored stools (red or black). ? Unusual bruising for unknown reasons. ? A serious fall or if you hit your head (even if there is no  bleeding).  Some medicines may interact with Eliquis and might increase your risk of bleeding or clotting while on Eliquis. To help avoid this, consult your healthcare provider or pharmacist prior to using any new prescription or non-prescription medications, including herbals, vitamins, non-steroidal anti-inflammatory drugs (NSAIDs) and supplements.  This website has more information on Eliquis (apixaban): http://www.eliquis.com/eliquis/home

## 2020-01-06 DIAGNOSIS — C187 Malignant neoplasm of sigmoid colon: Secondary | ICD-10-CM | POA: Diagnosis not present

## 2020-01-06 LAB — BASIC METABOLIC PANEL
Anion gap: 9 (ref 5–15)
BUN: 18 mg/dL (ref 8–23)
CO2: 28 mmol/L (ref 22–32)
Calcium: 7.8 mg/dL — ABNORMAL LOW (ref 8.9–10.3)
Chloride: 101 mmol/L (ref 98–111)
Creatinine, Ser: 0.98 mg/dL (ref 0.61–1.24)
GFR, Estimated: >60 mL/min (ref >60)
Glucose, Bld: 105 mg/dL — ABNORMAL HIGH (ref 70–99)
Potassium: 3.7 mmol/L (ref 3.5–5.1)
Sodium: 138 mmol/L (ref 135–145)

## 2020-01-06 LAB — GLUCOSE, CAPILLARY
Glucose-Capillary: 109 mg/dL — ABNORMAL HIGH (ref 70–99)
Glucose-Capillary: 115 mg/dL — ABNORMAL HIGH (ref 70–99)
Glucose-Capillary: 119 mg/dL — ABNORMAL HIGH (ref 70–99)
Glucose-Capillary: 119 mg/dL — ABNORMAL HIGH (ref 70–99)
Glucose-Capillary: 90 mg/dL (ref 70–99)
Glucose-Capillary: 93 mg/dL (ref 70–99)

## 2020-01-06 LAB — CBC
HCT: 25.7 % — ABNORMAL LOW (ref 39.0–52.0)
Hemoglobin: 7.8 g/dL — ABNORMAL LOW (ref 13.0–17.0)
MCH: 26.3 pg (ref 26.0–34.0)
MCHC: 30.4 g/dL (ref 30.0–36.0)
MCV: 86.5 fL (ref 80.0–100.0)
Platelets: 201 10*3/uL (ref 150–400)
RBC: 2.97 MIL/uL — ABNORMAL LOW (ref 4.22–5.81)
RDW: 16.3 % — ABNORMAL HIGH (ref 11.5–15.5)
WBC: 12.5 10*3/uL — ABNORMAL HIGH (ref 4.0–10.5)
nRBC: 0 % (ref 0.0–0.2)

## 2020-01-06 MED ORDER — LIDOCAINE VISCOUS HCL 2 % MT SOLN
15.0000 mL | Freq: Four times a day (QID) | OROMUCOSAL | Status: DC
  Administered 2020-01-06 – 2020-01-07 (×3): 15 mL via OROMUCOSAL
  Filled 2020-01-06 (×3): qty 15

## 2020-01-06 MED ORDER — AMOXICILLIN-POT CLAVULANATE 875-125 MG PO TABS
1.0000 | ORAL_TABLET | Freq: Two times a day (BID) | ORAL | Status: DC
  Administered 2020-01-06 – 2020-01-07 (×3): 1 via ORAL
  Filled 2020-01-06 (×3): qty 1

## 2020-01-06 MED ORDER — DILTIAZEM HCL ER 60 MG PO CP12
60.0000 mg | ORAL_CAPSULE | Freq: Two times a day (BID) | ORAL | Status: DC
  Administered 2020-01-06 (×2): 60 mg via ORAL
  Filled 2020-01-06 (×3): qty 1
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
## 2020-01-06 NOTE — Evaluation (Signed)
Physical Therapy Evaluation Patient Details Name: Clarence Andrews. MRN: [id number] DOB: 28-May-1950 Today's Date: 01/06/2020   History of Present Illness  69 yo male admitted with intractable vomiting. Hx of throat ca, Afib, COVID, orthostatic hypotension, Cdiff, pressure ulcers, CABG, met colorectal ca, DM, CAD  Clinical Impression  Bed level eval only. Pt declined OOB activity on today. He did agree to LE bed exercises. Pt reports he only has pain with swallowing. Will plan to follow during hospital stay. Recommend return to SNF.     Follow Up Recommendations  (return to SNF)    Equipment Recommendations  None recommended by PT    Recommendations for Other Services       Precautions / Restrictions Precautions Precautions: Fall Restrictions Weight Bearing Restrictions: No      Mobility  Bed Mobility               General bed mobility comments: NT-pt declined OOB on today. He agreed to bed level exercises    Transfers                    Ambulation/Gait                Stairs            Wheelchair Mobility    Modified Rankin (Stroke Patients Only)       Balance                                             Pertinent Vitals/Pain Pain Assessment:  (pain when swallowing)    Home Living Family/patient expects to be discharged to:: Skilled nursing facility                      Prior Function Level of Independence: Needs assistance   Gait / Transfers Assistance Needed: hasn't been able to ambulate much since having orthostatic hypotension. pt stated he is working with therapy "off/on"  ADL's / Homemaking Assistance Needed: currently af SNF pt completes bed baths and dressing with nursing assisting  Comments: admitted from SNF     Hand Dominance        Extremity/Trunk Assessment   Upper Extremity Assessment Upper Extremity Assessment: Generalized weakness    Lower Extremity Assessment Lower  Extremity Assessment: Generalized weakness    Cervical / Trunk Assessment Cervical / Trunk Assessment: Normal  Communication   Communication: No difficulties  Cognition Arousal/Alertness: Awake/alert Behavior During Therapy: WFL for tasks assessed/performed Overall Cognitive Status: Within Functional Limits for tasks assessed                                        General Comments      Exercises General Exercises - Lower Extremity Ankle Circles/Pumps: AROM;5 reps;AAROM;10 reps Heel Slides: AAROM;Both;10 reps Straight Leg Raises: AAROM;Both;10 reps   Assessment/Plan    PT Assessment Patient needs continued PT services  PT Problem List Decreased strength;Decreased mobility;Decreased range of motion;Decreased activity tolerance;Decreased balance;Decreased knowledge of use of DME       PT Treatment Interventions DME instruction;Gait training;Therapeutic activities;Therapeutic exercise;Patient/family education;Balance training;Functional mobility training    PT Goals (Current goals can be found in the Care Plan section)  Acute Rehab PT Goals Patient Stated Goal: less pain with swallowing PT Goal  Formulation: With patient Time For Goal Achievement: 01/20/20 Potential to Achieve Goals: Fair    Frequency Min 2X/week   Barriers to discharge        Co-evaluation               AM-PAC PT "6 Clicks" Mobility  Outcome Measure Help needed turning from your back to your side while in a flat bed without using bedrails?: A Lot Help needed moving from lying on your back to sitting on the side of a flat bed without using bedrails?: A Lot Help needed moving to and from a bed to a chair (including a wheelchair)?: Total Help needed standing up from a chair using your arms (e.g., wheelchair or bedside chair)?: A Lot Help needed to walk in hospital room?: Total Help needed climbing 3-5 steps with a railing? : Total 6 Click Score: 9    End of Session   Activity  Tolerance: Patient tolerated treatment well Patient left: in bed;with call bell/phone within reach;with bed alarm set   PT Visit Diagnosis: Muscle weakness (generalized) (M62.81);Difficulty in walking, not elsewhere classified (R26.2)    Time: 5449-2010 PT Time Calculation (min) (ACUTE ONLY): 11 min   Charges:   PT Evaluation $PT Eval Moderate Complexity: 1 Mod            Doreatha Massed, PT Acute Rehabilitation  Office: [phone] Pager: [phone]

## 2020-01-06 NOTE — Progress Notes (Signed)
   01/05/20 2249  Assess: MEWS Score  Temp 98.6 F (37 C)  BP 116/79  Pulse Rate (!) 128  Resp 14  SpO2 97 %  Assess: MEWS Score  MEWS Temp 0  MEWS Systolic 0  MEWS Pulse 2  MEWS RR 0  MEWS LOC 0  MEWS Score 2  MEWS Score Color Yellow  Assess: if the MEWS score is Yellow or Red  Were vital signs taken at a resting state? Yes  Focused Assessment No change from prior assessment  Early Detection of Sepsis Score *See Row Information* Medium  MEWS guidelines implemented *See Row Information* No, previously yellow, continue vital signs every 4 hours  Treat  MEWS Interventions Other (Comment) (n/a; MD aware. )  Pain Scale 0-10  Pain Score 0  Take Vital Signs  Increase Vital Sign Frequency  Yellow: Q 2hr X 2 then Q 4hr X 2, if remains yellow, continue Q 4hrs  Escalate  MEWS: Escalate Yellow: discuss with charge nurse/RN and consider discussing with provider and RRT  Notify: Charge Nurse/RN  Name of Charge Nurse/RN Notified Caren Griffins, RN (2nd RN; I am charge RN tonight)  Date Charge Nurse/RN Notified 01/05/20  Time Charge Nurse/RN Notified 1900  Notify: Provider  Provider Name/Title  (N/a; MD aware. )  Notify: Rapid Response  Name of Rapid Response RN Notified  (N/A; Not necessary at this time. )  Document  Patient Outcome Stabilized after interventions  Progress note created (see row info) Yes

## 2020-01-06 NOTE — Progress Notes (Signed)
  Speech Language Pathology Treatment: Dysphagia  Patient Details Name: Clarence Andrews. MRN: [id number] DOB: 03-Mar-1950 Today's Date: 01/06/2020 Time: 1010-1037 SLP Time Calculation (min) (ACUTE ONLY): 27 min  Assessment / Plan / Recommendation Clinical Impression  SLP spoke to pt regarding potential plans for MBS today.  Pt questions if he has thrush - advised him oral cavity is clear.  Pt observed consuming applesauce, Ensure and water - self feeding.   Multiple swallows noted across all consistencies - but no overt indication of aspiration.    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  H/O (tongue base) CA dx 4 years ago but he denies pna nor significant weight loss prior to recent metastatic cancer diagnosis.    Pt c/o heartburn PTA- for which he takes Tums. Per pt's statement, he recently had chemo and had copious amounts of vomiting, which may be consistent with esophagitis.  Largest complaint is odynophagia, which pt says is improving likely due to magic mouthwash and the PPI.    With pt's permission and MD approval, MBS will be cancelled at this time but will follow up to assure clinicallly managing well.  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  He denies coughing with po intake prior to intake.   Productive cough with green-tinged, viscous, quarter-sized secretions x2 noted during today's session.  SLP encouraged pt to expectorate secretions as normally he swallows them.    Educated pt to aspiration/dysphagia mitigation strategies using teach back.  MD approves and pt and RN informed and all agreeable to plan.    Pt advised he would like to stay on full liquid diet due to ongoing odynophagia.  HPI HPI: Clarence Andrews. is a 69 y.o. male with medical history significant of DM2, orthostatic hypotension, PAF.  Throat CA in remission, Metastatic colon CA with mets to liver, s/p  palliative stent to colon, 12 cycles of FOLFOX and the cancer progressed further based on CT earlier this month. Patient presents to the ED with c/o 5 episodes of N/V today.  Also has diarrhea ongoing for weeks.  Emesis is NBNB.  No abd pain he reports. Also has L sided neck pain, sore throat, and dysphagia for past few weeks.  Sharp and stabbing pain.  Worse with eating and drinking.  Has some lesions on tongue, concerned his throat CA may be coming back. CT head/neck WNL.      SLP Plan  Continue with current plan of care       Recommendations  Diet recommendations: Thin liquid (defer to pt - but advance only to dys3 due to edentulous status) Compensations: Slow rate;Small sips/bites Postural Changes and/or Swallow Maneuvers: Seated upright 90 degrees                Oral Care Recommendations: Oral care BID;Patient independent with oral care Follow up Recommendations: 24 hour supervision/assistance;Skilled Nursing facility;Other (comment) SLP Visit Diagnosis: Dysphagia, unspecified (R13.10) Plan: Continue with current plan of care       [place], Annaston Upham Ann 01/06/2020, 11:02 AM   Kathleen Lime, MS [facility] SLP Acute Rehab Services Office [phone] Pager [phone]
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
## 2020-01-06 NOTE — Progress Notes (Signed)
[facility] Progress Note  Patient: Clarence Andrews.    GHW:[id number]  DOA: 01/02/2020     Date of Service: the patient was seen and examined on 01/06/2020  Brief hospital course: History of head and neck cancer, colorectal cancer with metastasis, CAD, type II DM, C. difficile colitis, PAF.  Presents with complaints of nausea and vomiting found to have constipation with severe electrolyte imbalance.  Also had DVT.  CT scan also shows progression of the cancer with worsening metastasis. Currently plan is conservative measures with plan to transition back to SNF.  Assessment and Plan: 1.  Intractable nausea and vomiting likely from constipation. CT abdomen negative for any obstruction. Currently passing gas.  No nausea no vomiting since this whole holiday.  Continue bowel regimen. Nausea and vomiting currently have resolved.  2.  Colon cancer with liver metastasis Goals of care conversation. Progressive despite 12 cycles of chemotherapy. Also has stent placement. Discussed with patient regarding poor prognosis. Palliative care consulted. Oncology will be following as well. Per discussion between palliative care and patient on 01/04/2020 patient will be transitioned to hospice and comfort care.  A discussion of with patient on 01/05/2040. Patient does not want to transition to hospice. Wants to go to SNF and wants to get rehab. Eventual plan for patient is to meet back with the family in [place]. Continue palliative care consultation at SNF. This was discussed with Dr. Domingo Cocking as well as Education officer, museum.  3.  Aspiration pneumonitis versus aspiration pneumonia Currently not hypoxic. Bilateral crackles. On IV Unasyn.  Change to oral Augmentin. Monitor.    4.  Diabetes mellitus Now comfort care.  Hold medications.  5.  History of orthostatic hypotension. Currently blood pressure rather elevated.  Will hold midodrine.  6.  Paroxysmal A. fib.  Now with RVR. Resume  digoxin. Add Cardizem.  Monitor. Not on any anticoagulation.  7.  Age indeterminant DVT. CT scan was positive for common iliac DVT. Doppler positive for left common femoral DVT. Initiated heparin.  Now transition to [place]. Discussed with patient that he is at risk for bleeding secondary to his colon cancer history.  8.  Sacral stage I pressure ulcer. POA. Monitor.  9.  Chest pain. Troponin negative.  Chest x-ray negative.  Likely GI in nature. Continue pain control.  10.  Odynophagia. Patient reports throat pain while trying to swallow anything. Magic mouthwash with lidocaine.  Body mass index is 23.6 kg/m.    Interventions:    Pressure Injury 01/03/20 Sacrum Left Stage 1 -  Intact skin with non-blanchable redness of a localized area usually over a bony prominence. (Active)  01/03/20 1718  Location: Sacrum  Location Orientation: Left  Staging: Stage 1 -  Intact skin with non-blanchable redness of a localized area usually over a bony prominence.  Wound Description (Comments):   Present on Admission: Yes     Diet: Soft diet DVT Prophylaxis: Therapeutic anticoagulation with heparin   Advance goals of care discussion: DNR DNI  Family Communication: no family was present at bedside, at the time of interview.   Disposition:  Status is: Inpatient  Remains inpatient appropriate because:IV treatments appropriate due to intensity of illness or inability to take PO   Dispo: The patient is from: Home              Anticipated d/c is to: SNF              Anticipated d/c date is: 1 to 2 days.  Patient currently is not medically stable to d/c.  Subjective: Throat pain.  No nausea no vomiting.  No fever no chills.  No bleeding reported.  Physical Exam:  General: Appear in mild distress, no Rash; Oral Mucosa Clear, moist. no Abnormal Neck Mass Or lumps, Conjunctiva normal  Cardiovascular: S1 and S2 Present, no Murmur, Respiratory: good respiratory effort,  Bilateral Air entry present and CTA, no Crackles, no wheezes Abdomen: Bowel Sound present, Soft and no tenderness Extremities: no Pedal edema Neurology: alert and oriented to time, place, and person affect appropriate. no new focal deficit Gait not checked due to patient safety concerns  Vitals:   01/06/20 1054 01/06/20 1155 01/06/20 1336 01/06/20 1814  BP: 107/80  117/80 117/88  Pulse: (!) 134 (!) 133 (!) 133 (!) 136  Resp: 18  15 16   Temp: 98.6 F (37 C)  97.7 F (36.5 C) 98.5 F (36.9 C)  TempSrc: Oral  Oral Axillary  SpO2: 97% 97% 99% 95%  Weight:      Height:        Intake/Output Summary (Last 24 hours) at 01/06/2020 1930 Last data filed at 01/06/2020 4081 Gross per 24 hour  Intake 2463.91 ml  Output 200 ml  Net 2263.91 ml   Filed Weights   01/02/20 2142 01/03/20 1710  Weight: 83.1 kg 83.4 kg    Data Reviewed: I have personally reviewed and interpreted daily labs, tele strips, imagings as discussed above. I reviewed all nursing notes, pharmacy notes, vitals, pertinent old records I have discussed plan of care as described above with RN and patient/family.  CBC: Recent Labs  Lab 01/02/20 2215 01/03/20 0803 01/04/20 0315 01/05/20 0402 01/06/20 0507  WBC 13.9* 13.9* 16.0* 14.7* 12.5*  NEUTROABS 11.9* 12.5*  --   --   --   HGB 10.3* 9.2* 8.8* 7.7* 7.8*  HCT 33.7* 30.3* 28.5* 25.2* 25.7*  MCV 85.3 85.8 84.8 85.1 86.5  PLT 360 314 336 238 448   Basic Metabolic Panel: Recent Labs  Lab 01/02/20 2215 01/03/20 0803 01/04/20 0315 01/05/20 0402 01/06/20 0507  NA 139 140 137 139 138  K 3.5 3.8 3.2* 3.8 3.7  CL 98 101 100 101 101  CO2 26 27 27 28 28   GLUCOSE 224* 267* 112* 106* 105*  BUN 19 20 15 17 18   CREATININE 1.01 1.06 0.82 1.11 0.98  CALCIUM 8.8* 8.3* 8.0* 7.9* 7.8*  MG  --  1.4* 1.5*  --   --     Studies: No results found.  Scheduled Meds: . amoxicillin-clavulanate  1 tablet Oral Q12H  . Chlorhexidine Gluconate Cloth  6 each Topical Daily  .  digoxin  0.25 mg Oral Daily  . diltiazem  60 mg Oral Q12H  . docusate sodium  100 mg Oral BID  . lidocaine  15 mL Mouth/Throat QID  . magic mouthwash  15 mL Oral QID  . pantoprazole (PROTONIX) IV  40 mg Intravenous Q24H  . polyethylene glycol  17 g Oral Daily  . Rivaroxaban  15 mg Oral BID WC   Followed by  . [START ON 01/26/2020] rivaroxaban  20 mg Oral Q supper   Continuous Infusions:  PRN Meds: acetaminophen, bisacodyl, Muscle Rub, ondansetron **OR** ondansetron (ZOFRAN) IV, oxyCODONE-acetaminophen  Time spent: 35 minutes  Author: Berle Mull, MD [facility] 01/06/2020 7:30 PM  To reach On-call, see care teams to locate the attending and reach out via [email or web address]. Between 7PM-7AM, please contact night-coverage If you still have difficulty reaching the attending provider,  please page the [facility] (Director on Call) for [facility] on amion for assistance.

## 2020-01-06 NOTE — Progress Notes (Addendum)
IP PROGRESS NOTE  Subjective:   The patient reports that he feels better overall this morning.  Denies nausea, vomiting, diarrhea.  Reports ongoing odynophagia.  Objective: Vital signs in last 24 hours: Blood pressure 128/85, pulse (!) 132, temperature 97.6 F (36.4 C), temperature source Oral, resp. rate 14, height 6' 2"  (1.88 m), weight 83.4 kg, SpO2 98 %.  Intake/Output from previous day: 12/05 0701 - 12/06 0700 In: 100 [P.O.:100] Out: 200 [Urine:200]  Physical Exam:  HEENT: Pharynx without erythema, exudate, or ulcers Lungs: Clear anteriorly, no respiratory distress Cardiac: Tachycardic, irregular Abdomen: Nontender, no mass, no hepatosplenomegaly Extremities: No leg edema Neurologic: Lethargic, arousable, follows commands, appears oriented  Portacath/PICC-without erythema  Lab Results: Recent Labs    01/05/20 0402 01/06/20 0507  WBC 14.7* 12.5*  HGB 7.7* 7.8*  HCT 25.2* 25.7*  PLT 238 201    BMET Recent Labs    01/05/20 0402 01/06/20 0507  NA 139 138  K 3.8 3.7  CL 101 101  CO2 28 28  GLUCOSE 106* 105*  BUN 17 18  CREATININE 1.11 0.98  CALCIUM 7.9* 7.8*    Lab Results  Component Value Date   CEA1 174.72 (H) 11/25/2019    Studies/Results: DG CHEST PORT 1 VIEW  Result Date: 01/04/2020 CLINICAL DATA:  History of head neck cancer. History of colorectal cancer with metastases. EXAM: PORTABLE CHEST 1 VIEW COMPARISON:  January 03, 2020 FINDINGS: Stable right Port-A-Cath. No pneumothorax. The lungs are clear. The heart, hila, mediastinum are unremarkable. IMPRESSION: No active disease. Electronically Signed   By: Dorise Bullion III M.D   On: 01/04/2020 13:47    Medications: I have reviewed the patient's current medications.  Assessment/Plan:  1. 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 Colonoscopy 03/07/2019-nearly completely obstructing mass beginning at 16 cm from the anal verge, sigmoid colon polyp ? CTs  03/15/2019-wall thickening of the lower sigmoid colon, low rectal mass with significant luminal narrowing, diffuse liver metastases, borderline enlarged sigmoid mesocolon, iliac, and upper abdominal lymph nodes ? Elevated CEA ? 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 Cycle 1 FOLFOX 04/11/2019 ? Cycle 2 FOLFOX 05/28/2019 ? Cycle 3 FOLFOX 06/11/2019 ? Cycle 4 FOLFOX 06/25/2019 ? CT abdomen/pelvis 07/10/2019-severe obstructing stricture at the sigmoid colon, stable to slight improvement in hepatic metastatic disease compared to February 2021 ? Cycle 5 FOLFOX 07/31/2019 ? Cycle6FOLFOX 08/13/2019 ? Cycle 7 FOLFOX 08/27/2019 ? CT abdomen/pelvis 09/12/2019-stable and slightly decreased hepatic lesions, no evidence of progressive metastatic disease, slight enlargement of a previously noted pancreas head/uncinate lesion, no evidence of colonic obstruction, stable small mesenteric and retroperitoneal nodes ? Cycle8FOLFOX 09/26/2019, oxaliplatin held secondary to persistent orthostatic hypotension ? Cycle 9 FOLFOX 10/14/2019, oxaliplatin on hold secondary to persistent orthostatic hypotension ? Cycle 10 FOLFOX 10/28/2019, oxaliplatin on hold secondary to persistent orthostatic hypotension ? Cycle 11 FOLFOX 11/11/2019, oxaliplatin on hold secondary to persistent orthostatic hypotension ? Cycle 12 FOLFOX 11/25/2019, oxaliplatin on hold secondary to persistent orthostatic hypotension ? CT abdomen/pelvis 12/05/2019-increased size and number of liver metastases, mural thickening in the posterior bladder with enhancement, stable small cystic lesion in the pancreas head, stable sigmoid stent without evidence of obstruction ? Cycle 1 FOLFIRI 12/09/2019 2. 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 Diabetes 4. Coronary artery disease, status post coronary artery bypass surgery in 2017  5. Neutropenia following cycle 1 FOLFOX 6. Admission 04/28/2019 with COVID-19 infection 7. C. difficile colitis 04/28/2019 8. Atrial flutter during hospital admission March/April 2021-treated with a Cardizem drip, digoxin, and Eliquis anticoagulation. Converted to metoprolol at discharge.  Admission with recurrent rapid atrial fibrillation/flutter 07/09/2019 9. 07/09/2019-hospital admission for bowel obstruction  Sigmoidoscopy 07/12/2019-completely obstructing mass at 17-20 cm proximal to the anus-oozing present, stent placed 10. Fall 07/24/2019 with acute kidney injury/rhabdomyolysis 11. 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 Florinef 0.1 mg daily beginning 11/11/2019. 13.Right foot drop is likely due to weight loss/nerve compression 14. C. 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  Hospital admission 12/11/2019-hypotension and concern for GI bleed 16.  Presentation the emergency room with nausea/vomiting, "sore throat ", and diarrhea 01/03/2020 17.  Possible left femoral DVT on CT 01/03/2020, age-indeterminate left lower extremity DVT on Doppler 01/03/2020-  Mr. Loescher has metastatic colon cancer.  He has completed 1 cycle of second line treatment with FOLFIRI.  He was admitted with nausea and vomiting of unclear etiology.  His nausea vomiting have now resolved.  Reports ongoing odynophagia of unclear etiology.  He has persistent tachycardia due to paroxysmal A. fib/a flutter.  Currently on digoxin.  He has an age-indeterminate DVT noted.  He has been transitioned from heparin to Xarelto.  Mr Phifer has a poor performance status.  I do not feel he is a candidate for further chemotherapy.  The patient agrees to DNR/DNI.  Hospice has been discussed with the patient and he does not wish  to enroll in hospice.  He wants to go to SNF for rehab and then eventually to [place] with family.  Recommendations: 1.  Continue supportive care measures 2.  Continue digoxin for A. fib.  Consider cardiology consult if rate not controlled. 3.  Continue Xarelto for DVT.  Monitor very closely for signs of bleeding.   LOS: 3 days   Mikey Bussing, NP   01/06/2020, 7:40 AM Mr. Mancil was interviewed and examined.  He has no nausea today.  He complains of odontophagia.  No thrush or mucositis visible on examination of the oropharynx.  The etiology of the odynophagia is unclear.  I doubt this is related to chemotherapy induced mucositis.  I would continue symptom management for now.  Mr. Belger has a poor prognosis.  He is not a candidate for further chemotherapy.  I recommend hospice care.  He is undecided on a disposition plan at present.  We discussed CPR and ACLS.  He agrees to no CODE BLUE status.  He is now taking Xarelto for a left lower extremity DVT.  He is at risk for bleeding with the sigmoid colon tumor.  I recommend discontinuing Xarelto and considering a trial of Lovenox if he has rectal bleeding. He appears to have developed recurrent A. fib/flutter.  Rate control to be managed by the medical service.

## 2020-01-07 DIAGNOSIS — R112 Nausea with vomiting, unspecified: Secondary | ICD-10-CM | POA: Diagnosis not present

## 2020-01-07 LAB — BASIC METABOLIC PANEL
Anion gap: 7 (ref 5–15)
BUN: 18 mg/dL (ref 8–23)
CO2: 28 mmol/L (ref 22–32)
Calcium: 7.9 mg/dL — ABNORMAL LOW (ref 8.9–10.3)
Chloride: 100 mmol/L (ref 98–111)
Creatinine, Ser: 1 mg/dL (ref 0.61–1.24)
GFR, Estimated: >60 mL/min (ref >60)
Glucose, Bld: 142 mg/dL — ABNORMAL HIGH (ref 70–99)
Potassium: 3.6 mmol/L (ref 3.5–5.1)
Sodium: 135 mmol/L (ref 135–145)

## 2020-01-07 LAB — GLUCOSE, CAPILLARY
Glucose-Capillary: 134 mg/dL — ABNORMAL HIGH (ref 70–99)
Glucose-Capillary: 165 mg/dL — ABNORMAL HIGH (ref 70–99)
Glucose-Capillary: 140 mg/dL — ABNORMAL HIGH (ref 70–99)
Glucose-Capillary: 148 mg/dL — ABNORMAL HIGH (ref 70–99)

## 2020-01-07 LAB — CBC
HCT: 26.1 % — ABNORMAL LOW (ref 39.0–52.0)
Hemoglobin: 8 g/dL — ABNORMAL LOW (ref 13.0–17.0)
MCH: 26.3 pg (ref 26.0–34.0)
MCHC: 30.7 g/dL (ref 30.0–36.0)
MCV: 85.9 fL (ref 80.0–100.0)
Platelets: 222 10*3/uL (ref 150–400)
RBC: 3.04 MIL/uL — ABNORMAL LOW (ref 4.22–5.81)
RDW: 16.3 % — ABNORMAL HIGH (ref 11.5–15.5)
WBC: 11 10*3/uL — ABNORMAL HIGH (ref 4.0–10.5)
nRBC: 0 % (ref 0.0–0.2)

## 2020-01-07 LAB — SARS CORONAVIRUS 2 BY RT PCR (HOSPITAL ORDER, PERFORMED IN ~~LOC~~ HOSPITAL LAB): SARS Coronavirus 2: NEGATIVE

## 2020-01-07 MED ORDER — APIXABAN 5 MG PO TABS: 5.0000 mg | ORAL_TABLET | Freq: Two times a day (BID) | ORAL | Status: DC

## 2020-01-07 MED ORDER — MIDODRINE HCL 10 MG PO TABS: 10.0000 mg | ORAL_TABLET | Freq: Three times a day (TID) | ORAL | 0 refills | Status: AC | PRN

## 2020-01-07 MED ORDER — METOPROLOL TARTRATE 25 MG PO TABS: 25.0000 mg | ORAL_TABLET | Freq: Every day | ORAL | 0 refills | Status: AC | PRN

## 2020-01-07 MED ORDER — LIDOCAINE VISCOUS HCL 2 % MT SOLN
15.0000 mL | Freq: Four times a day (QID) | OROMUCOSAL | 0 refills | Status: DC
Start: 2020-01-07 — End: 2020-02-19

## 2020-01-07 MED ORDER — METOPROLOL TARTRATE 5 MG/5ML IV SOLN
5.0000 mg | INTRAVENOUS | Status: AC | PRN
  Administered 2020-01-07: 5 mg via INTRAVENOUS
  Filled 2020-01-07: qty 5

## 2020-01-07 MED ORDER — DILTIAZEM HCL ER COATED BEADS 120 MG PO CP24
120.0000 mg | ORAL_CAPSULE | Freq: Every day | ORAL | 0 refills | Status: DC
Start: 2020-01-07 — End: 2020-01-07

## 2020-01-07 MED ORDER — METOPROLOL TARTRATE 25 MG PO TABS: 25.0000 mg | ORAL_TABLET | Freq: Every day | ORAL | Status: DC | PRN

## 2020-01-07 MED ORDER — BISACODYL 10 MG RE SUPP: 10.0000 mg | Freq: Every day | RECTAL | 0 refills | Status: DC | PRN

## 2020-01-07 MED ORDER — PANTOPRAZOLE SODIUM 40 MG PO TBEC
40.0000 mg | DELAYED_RELEASE_TABLET | Freq: Every day | ORAL | Status: DC
  Administered 2020-01-07: 40 mg via ORAL
  Filled 2020-01-07: qty 1

## 2020-01-07 MED ORDER — AMOXICILLIN-POT CLAVULANATE 875-125 MG PO TABS: 1.0000 | ORAL_TABLET | Freq: Two times a day (BID) | ORAL | 0 refills | Status: AC

## 2020-01-07 MED ORDER — POLYETHYLENE GLYCOL 3350 17 G PO PACK: 17.0000 g | PACK | Freq: Every day | ORAL | 0 refills | Status: DC

## 2020-01-07 MED ORDER — OXYCODONE-ACETAMINOPHEN 5-325 MG PO TABS: 1.0000 | ORAL_TABLET | ORAL | 0 refills | Status: DC | PRN

## 2020-01-07 MED ORDER — HEPARIN SOD (PORK) LOCK FLUSH 100 UNIT/ML IV SOLN
500.0000 [IU] | INTRAVENOUS | Status: AC | PRN
  Administered 2020-01-07: 500 [IU]
  Filled 2020-01-07: qty 5

## 2020-01-07 MED ORDER — APIXABAN 2.5 MG PO TABS
ORAL_TABLET | ORAL | 0 refills | Status: DC
Start: 2020-01-07 — End: 2020-02-26

## 2020-01-07 MED ORDER — APIXABAN 2.5 MG PO TABS: 2.5000 mg | ORAL_TABLET | Freq: Two times a day (BID) | ORAL | Status: DC

## 2020-01-07 MED ORDER — MAGIC MOUTHWASH
15.0000 mL | Freq: Four times a day (QID) | ORAL | 0 refills | Status: DC
Start: 2020-01-07 — End: 2020-02-26

## 2020-01-07 MED ORDER — DILTIAZEM HCL ER COATED BEADS 120 MG PO CP24
120.0000 mg | ORAL_CAPSULE | Freq: Every day | ORAL | Status: DC
  Filled 2020-01-07: qty 1

## 2020-01-07 MED ORDER — DOCUSATE SODIUM 100 MG PO CAPS
100.0000 mg | ORAL_CAPSULE | Freq: Two times a day (BID) | ORAL | 0 refills | Status: DC
Start: 2020-01-07 — End: 2020-02-26

## 2020-01-07 NOTE — Progress Notes (Signed)
ANTICOAGULATION CONSULT NOTE Pharmacy Consult for apixaban Indication: L DVT  No Known Allergies  Patient Measurements: Height: 6\' 2"  (188 cm) Weight: 83.4 kg (183 lb 12.8 oz) IBW/kg (Calculated) : 82.2  Vital Signs: Temp: 97.8 F (36.6 C) (12/07 0457) Temp Source: Oral (12/07 0457) BP: 108/62 (12/07 0457) Pulse Rate: 64 (12/07 0457)  Labs: Recent Labs    01/04/20 1120 01/04/20 1237 01/05/20 0402 01/05/20 0402 01/06/20 0507 01/07/20 0500  HGB  --   --  7.7*   < > 7.8* 8.0*  HCT  --   --  25.2*  --  25.7* 26.1*  PLT  --   --  238  --  201 222  HEPARINUNFRC 0.61  --  0.42  --   --   --   CREATININE  --   --  1.11  --  0.98 1.00  TROPONINIHS  --  23*  --   --   --   --    < > = values in this interval not displayed.    Estimated Creatinine Clearance: 78.8 mL/min (by C-G formula based on SCr of 1 mg/dL).  Medical History: Past Medical History:  Diagnosis Date  . CAD (coronary artery disease) 2017   CABG  . Cancer ([place])   . CHF (congestive heart failure) ([place])   . Colitis   . COVID-19 04/29/2019  . Diabetes ([place])   . Paronychia of second toe of right foot   . Throat cancer ([place])     Assessment: 56 yoM with PMH DM2, PAF (not on anticoag PTA d/t hx hematemesis), colon CA with mets to liver. Pt admitted on 12/2 for intractable N/V. Venous doppler on 12/3 revealed left lower extremity DVT.  During 12/11/19 admission, noted to have "one episode of emesis that looked like coffee-ground. Recent upper GI endoscopy was normal, started on PPI.   Baseline INR, aPTT: not done  Prior anticoagulation: none d/t recent hematemesis  Patient is risk for bleeding due to colorectal cancer history. DVT initially treated with IV UFH, was transitioned to treatment dose rivaroxaban on 12/5. Pharmacy now consulted to transition DOAC to apixaban given patients risk for GI bleed.   Today, 01/07/2020:  CBC: Hgb (8) remains low but stable. Plt WNL & stable  SCr = 1, CrCl ~79  mL/min  Patient has tolerated anticoagulation inpatient, with no bleeding.  Plan:  Discontinue rivaroxaban  Per discussion with MD, will transition to reduced dose of apixaban per oncology recommendation  Apixaban 5 mg PO BID loading dose through 12/10 followed by apixaban 2.5 mg PO BID  Monitor closely for signs of bleeding  Patient counseled on signs of bleeding  Will provide a total of 7 days of anticoagulation loading dosing  Lenis Noon, PharmD 01/07/20 9:55 AM

## 2020-01-07 NOTE — Discharge Summary (Addendum)
[facility] Discharge Summary   Patient: Clarence Andrews. XBD:[id number]  PCP: Patient, No Pcp Per  Date of admission: 01/02/2020   Date of discharge:  01/07/2020     Discharge Diagnoses:  Principal Problem:   Intractable vomiting Active Problems:   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[place])   Type 2 diabetes mellitus without complication (HCC)   Orthostatic hypotension   Pressure injury of skin   Aspiration pneumonitis (HCC)   PAF (paroxysmal atrial fibrillation) (HCC)   Bladder wall thickening   History of throat cancer   Aspiration pneumonia ([place])  Admitted From: SNF Disposition:  SNF   Recommendations for Outpatient Follow-up:  1. PCP: please follow up with PCP in 1 week 2. Establish care with Palliative care and continue to engage in goals of care conversation 3. Follow up LABS/TEST:  none   Follow-up Information    Ladell Pier, MD Follow up.   Specialty: Oncology Contact information: [place] [address] [phone]        Palliative care. Schedule an appointment as soon as possible for a visit in 1 week(s).   Why: establish care and continue to engage in goals of care conversation.              Diet recommendation: Regular diet  Activity: The patient is advised to gradually reintroduce usual activities, as tolerated  Discharge Condition: stable  Code Status: DNR   History of present illness: As per the H and P dictated on admission, "Clarence Andrews. is a 69 y.o. male with medical history significant of DM2, orthostatic hypotension, PAF.  Throat CA in remission, Metastatic colon CA with mets to liver, s/p palliative stent to colon, 12 cycles of FOLFOX and the cancer progressed further based on CT earlier this month.  Patient presents to the ED with c/o 5 episodes of N/V today.  Also has diarrhea ongoing for weeks.  Emesis is NBNB.  No abd pain he reports.  Also has L sided neck pain, sore throat, and dysphagia  for past few weeks.  Sharp and stabbing pain.  Worse with eating and drinking.  Has some lesions on tongue, concerned his throat CA may be coming back."  Hospital Course:  Summary of his active problems in the hospital is as following. 1.  Intractable nausea and vomiting likely from constipation. CT abdomen negative for any obstruction. Currently passing gas.  No nausea no vomiting since this whole holiday.  Continue bowel regimen. Nausea and vomiting currently have resolved.  2.  Colon cancer with liver metastasis Goals of care conversation. Progressive despite 12 cycles of chemotherapy. Also has stent placement. Discussed with patient regarding poor prognosis. Palliative care consulted. Oncology will be following as well. Per discussion between palliative care and patient on 01/04/2020 patient will be transitioned to hospice and comfort care.  A discussion of with patient on 01/05/2040. Patient does not want to transition to hospice. Wants to go to SNF and wants to get rehab. Eventual plan for patient is to meet back with the family in [place]. Continue palliative care consultation at SNF. This was discussed with Dr. Domingo Cocking as well as Education officer, museum.  3.  Aspiration pneumonitis versus aspiration pneumonia Currently not hypoxic. Bilateral crackles. On IV Unasyn.  Change to oral Augmentin. 2 more days   4.  Diabetes mellitus controlled.  PO intake is minimal for now will just use diet control as method for treatment instead of oral hypoglycemic agents.   5.  History  of orthostatic hypotension. Currently blood pressure rather elevated.  Will hold midodrine and change to PRN  6.  Paroxysmal A. fib.  Now with RVR. Resume digoxin. Add Cardizem.  Monitor. Not on any anticoagulation PTA.  No apixaban  7.  Age indeterminant DVT. CT scan was positive for common femoral DVT. Doppler positive for left common femoral DVT. Initiated heparin.  later on transition to  [name]. Discussed with patient that he is at risk for bleeding secondary to his colon cancer history. D/w Oncology due to concern for bleeding in colon cancer pt.  Currently we will change to apixaban and reduce the lading dose to 5 mb BID for 3 more days followed by 2.5 mg BID.  If he has bleeding on apixaban, will need to change to lovenox.   8.  Sacral stage I pressure ulcer. POA. Foam dressing  Monitor.  9.  Chest pain. Resolved  GI in nature Troponin negative.   Chest x-ray negative.  Likely GI in nature. Continue PPI and treatment for odynophagia  10.  Odynophagia. Patient reports throat pain while trying to swallow anything. Magic mouthwash with lidocaine.     Pressure Injury 01/03/20 Sacrum Left Stage 1 -  Intact skin with non-blanchable redness of a localized area usually over a bony prominence. (Active)  01/03/20 1718  Location: Sacrum  Location Orientation: Left  Staging: Stage 1 -  Intact skin with non-blanchable redness of a localized area usually over a bony prominence.  Wound Description (Comments):   Present on Admission: Yes     Pain control  - [place] Controlled Substance Reporting System database was reviewed. - 5 day supply was provided. - Patient was instructed, not to drive, operate heavy machinery, perform activities at heights, swimming or participation in water activities or provide baby sitting services while on Pain, Sleep and Anxiety Medications; until his outpatient Physician has advised to do so again.  - Also recommended to not to take more than prescribed Pain, Sleep and Anxiety Medications.  Patient was seen by physical therapy, who recommended SNF,  was arranged. On the day of the discharge the patient's vitals were stable, and no other acute medical condition were reported by patient. The patient was felt safe to be discharge at SNF with therapy.  Consultants: Oncology, Palliative care  Procedures: none  Discharge  Exam: General: Appear in no distress, no Rash; Oral Mucosa Clear, moist. no Abnormal Neck Mass Or lumps, Conjunctiva normal  Cardiovascular: S1 and S2 Present, no Murmur Respiratory: good respiratory effort, Bilateral Air entry present and CTA, no Crackles, no wheezes Abdomen: Bowel Sound present, Soft and no tenderness Extremities: no Pedal edema Neurology: alert and oriented to time, place, and person affect appropriate. no new focal deficit  [facility] Weights   01/02/20 2142 01/03/20 1710  Weight: 83.1 kg 83.4 kg   Vitals:   01/07/20 0457 01/07/20 1008  BP: 108/62 125/80  Pulse: 64 (!) 51  Resp: 14 16  Temp: 97.8 F (36.6 C) (!) 97.4 F (36.3 C)  SpO2: 97% 97%    DISCHARGE MEDICATION: Allergies as of 01/07/2020   No Known Allergies     Medication List    STOP taking these medications   glipiZIDE 10 MG tablet Commonly known as: GLUCOTROL     TAKE these medications   acetaminophen 325 MG tablet Commonly known as: TYLENOL Take 650 mg by mouth every 6 (six) hours as needed for mild pain.   amoxicillin-clavulanate 875-125 MG tablet Commonly known as: AUGMENTIN Take  1 tablet by mouth 2 (two) times daily for 2 days.   apixaban 2.5 MG Tabs tablet Commonly known as: ELIQUIS 5 mg BID starting 01/07/2020 till 01/10/2020. Starting 01/11/2020 take 2.5 mg BID.   ascorbic acid 500 MG tablet Commonly known as: VITAMIN C Take 1 tablet (500 mg total) by mouth daily.   bisacodyl 10 MG suppository Commonly known as: DULCOLAX Place 1 suppository (10 mg total) rectally daily as needed for moderate constipation.   calcium carbonate 1500 (600 Ca) MG Tabs tablet Commonly known as: OSCAL Take 1,500 mg by mouth 2 (two) times daily as needed (heartburn).   Chromium-Cinnamon [phone] MCG-MG Caps Take 2,000 mg by mouth daily.   D3-1000 PO Take 1,000 Units by mouth daily.   Decubi-Vite Caps Take 1 capsule by mouth daily.   Dermacloud Oint Apply 1 application topically daily as  needed (protectant).   digoxin 0.25 MG tablet Commonly known as: LANOXIN Take 1 tablet (0.25 mg total) by mouth daily.   docusate sodium 100 MG capsule Commonly known as: COLACE Take 1 capsule (100 mg total) by mouth 2 (two) times daily.   ferrous sulfate 325 (65 FE) MG EC tablet Take 325 mg by mouth daily with breakfast.   hyoscyamine 0.125 MG tablet Commonly known as: LEVSIN Take 1 tablet (0.125 mg total) by mouth every 4 (four) hours as needed for cramping.   levalbuterol 45 MCG/ACT inhaler Commonly known as: XOPENEX HFA Inhale 2 puffs into the lungs every 6 (six) hours as needed for wheezing.   lidocaine 2 % solution Commonly known as: XYLOCAINE Use as directed 15 mLs in the mouth or throat 4 (four) times daily.   loperamide 2 MG capsule Commonly known as: IMODIUM Take 1 capsule by mouth 4 (four) times daily as needed for diarrhea or loose stools.   magic mouthwash Soln Take 15 mLs by mouth 4 (four) times daily.   magnesium oxide 400 MG tablet Commonly known as: MAG-OX Take 800 mg by mouth daily.   metoprolol tartrate 25 MG tablet Commonly known as: LOPRESSOR Take 1 tablet (25 mg total) by mouth daily as needed (for Heart rate >120).   midodrine 10 MG tablet Commonly known as: PROAMATINE Take 1 tablet (10 mg total) by mouth 3 (three) times daily as needed (orthostatic. SBP<90). What changed:   when to take this  reasons to take this   multivitamin with minerals Tabs tablet Take 1 tablet by mouth daily.   omeprazole 20 MG capsule Commonly known as: PriLOSEC Take 1 capsule (20 mg total) by mouth 2 (two) times daily.   oxyCODONE-acetaminophen 5-325 MG tablet Commonly known as: PERCOCET/ROXICET Take 1 tablet by mouth every 4 (four) hours as needed for moderate pain.   polyethylene glycol 17 g packet Commonly known as: MIRALAX / GLYCOLAX Take 17 g by mouth daily.   PROPYLENE GLYCOL OP Place 1 drop into both eyes as needed (dry eyes).   Turmeric 500 MG  Caps Take 1,000 mg by mouth 2 (two) times daily.   vitamin B-12 500 MCG tablet Commonly known as: CYANOCOBALAMIN Take 500 mcg by mouth daily.            Discharge Care Instructions  (From admission, onward)         Start     Ordered   01/07/20 0000  Discharge wound care:       Comments: Foam dressing   01/07/20 0858         No Known Allergies Discharge Instructions  Diet - low sodium heart healthy   Complete by: As directed    Discharge wound care:   Complete by: As directed    Foam dressing   Increase activity slowly   Complete by: As directed       The results of significant diagnostics from this hospitalization (including imaging, microbiology, ancillary and laboratory) are listed below for reference.    Significant Diagnostic Studies: CT Soft Tissue Neck W Contrast  Result Date: 01/03/2020 CLINICAL DATA:  Lymphadenopathy.  Colonic carcinoma. EXAM: CT NECK WITH CONTRAST TECHNIQUE: Multidetector CT imaging of the neck was performed using the standard protocol following the bolus administration of intravenous contrast. CONTRAST:  181mL OMNIPAQUE IOHEXOL 300 MG/ML  SOLN COMPARISON:  None. FINDINGS: PHARYNX AND LARYNX: The nasopharynx, oropharynx and larynx are normal. Visible portions of the oral cavity, tongue base and floor of mouth are normal. Normal epiglottis, vallecula and pyriform sinuses. The larynx is normal. No retropharyngeal abscess, effusion or lymphadenopathy. SALIVARY GLANDS: Parotid glands are normal. Nonvisualized submandibular glands. THYROID: Normal. LYMPH NODES: No enlarged or abnormal density lymph nodes. VASCULAR: There is predominantly noncalcified atherosclerosis of both carotid systems without high-grade stenosis. LIMITED INTRACRANIAL: Normal. VISUALIZED ORBITS: Normal. MASTOIDS AND VISUALIZED PARANASAL SINUSES: No fluid levels or advanced mucosal thickening. No mastoid effusion. SKELETON: No bony spinal canal stenosis. No lytic or blastic lesions.  UPPER CHEST: Clear. OTHER: None. IMPRESSION: 1. No cervical lymphadenopathy. 2. Bilateral predominantly noncalcified carotid bifurcation atherosclerosis without high-grade stenosis. Electronically Signed   By: Ulyses Jarred M.D.   On: 01/03/2020 02:49   CT Angio Chest PE W and/or Wo Contrast  Result Date: 12/11/2019 CLINICAL DATA:  17 65-year-old male with concern for pulmonary embolism. Colon cancer. EXAM: CT ANGIOGRAPHY CHEST WITH CONTRAST TECHNIQUE: Multidetector CT imaging of the chest was performed using the standard protocol during bolus administration of intravenous contrast. Multiplanar CT image reconstructions and MIPs were obtained to evaluate the vascular anatomy. CONTRAST:  52mL OMNIPAQUE IOHEXOL 350 MG/ML SOLN COMPARISON:  Chest CT dated 04/28/2019 and radiograph dated 09/10/2019 FINDINGS: Cardiovascular: There is no cardiomegaly or pericardial effusion. There is coronary vascular calcification and postsurgical changes of CABG. The ascending aorta is enlarged measuring 4.5 cm in diameter similar to prior CT. Evaluation of the aorta is limited due to suboptimal opacification and timing of the contrast. No pulmonary artery embolus identified. Mediastinum/Nodes: No hilar or mediastinal adenopathy. The esophagus and the thyroid gland are grossly unremarkable. No mediastinal fluid collection. Right-sided Port-A-Cath with tip at the cavoatrial junction. Lungs/Pleura: Clusters of ground-glass nodularity in the left lower lobe most concerning for pneumonia versus aspiration. Clinical correlation is recommended. There is a calcified granuloma in the lingula anteriorly. No lobar consolidation, pleural effusion, pneumothorax. The central airways are patent. Small amount of mucus content noted within the distal esophagus and left mainstem bronchus. Upper Abdomen: Multiple hepatic hypodense lesions most consistent with metastatic disease. Multiple gallstones. Partially visualized right renal hypodense lesion,  likely a cyst. Musculoskeletal: Osteopenia with degenerative changes of the spine. No acute osseous pathology. Median sternotomy wires. Review of the MIP images confirms the above findings. IMPRESSION: 1. No CT evidence of pulmonary artery embolus. 2. Clusters of ground-glass nodularity in the left lower lobe most concerning for pneumonia versus aspiration. Clinical correlation is recommended. 3. Multiple hepatic metastatic disease. 4. Cholelithiasis. 5. Aortic Atherosclerosis (ICD10-I70.0). Electronically Signed   By: Anner Crete M.D.   On: 12/11/2019 17:02   CT ABDOMEN PELVIS W CONTRAST  Result Date: 01/03/2020 CLINICAL DATA:  Nausea, vomiting  EXAM: CT ABDOMEN AND PELVIS WITH CONTRAST TECHNIQUE: Multidetector CT imaging of the abdomen and pelvis was performed using the standard protocol following bolus administration of intravenous contrast. CONTRAST:  145mL OMNIPAQUE IOHEXOL 300 MG/ML  SOLN COMPARISON:  12/05/2019 FINDINGS: Lower chest: Minimal nodular infiltrate has developed within the a posterior basal segments of the lower lobes bilaterally, possibly reflecting changes related to aspiration, less likely atypical infection. Cardiac size within normal limits. Mild coronary artery calcification noted. Median sternotomy has been performed. Hepatobiliary: Multiple hepatic metastases are again identified and appear grossly stable since prior examination. No intra or extrahepatic biliary ductal dilation. Cholelithiasis again noted. Pancreas: Cystic lesion previously noted within the pancreatic head is not well appreciated on the current examination. The pancreas is mildly atrophic, but is otherwise unremarkable. Spleen: Unremarkable Adrenals/Urinary Tract: The adrenal glands are unremarkable. The kidneys are normal in size and position. Multiple simple cortical cysts are seen bilaterally. 3 mm nonobstructing calculus is seen within the lower pole of the left kidney. No hydronephrosis. No solid intrarenal  masses. No ureteral calculi. Eccentric bladder wall thickening involving the a posterior wall the bladder is again noted likely reflecting an underlying urothelial carcinoma. There is high attenuation material within the bladder lumen which likely reflect excreted urinary contrast. Stomach/Bowel: The stomach is fluid-filled. No evidence of gastric outlet obstruction, however. Small bowel is unremarkable. Appendix normal. Palliative stent noted within the a distal sigmoid colon, unchanged from prior examination. Moderate stool seen upstream from the colonic stent though the caliber of the large bowel is within normal limits. Moderate stool within the rectal vault. No free intraperitoneal gas or fluid. Vascular/Lymphatic: There is central hypodensity within the left common femoral vein suspicious for a a deep venous thrombus in this location. The abdominal vasculature is otherwise unremarkable. No pathologic adenopathy within the abdomen and pelvis. Reproductive: Prostate is unremarkable. Other: Rectum unremarkable Musculoskeletal: Degenerative changes are seen within the lumbar spine. No lytic or blastic bone lesions are seen. IMPRESSION: Trace inflammatory changes within the visualized lung bases likely related to aspiration. Fluid-filled stomach and given history of vomiting with support this assertion. Numerous hepatic metastases in keeping with metastatic colon cancer, unchanged from prior examination. Colonic stent noted within the sigmoid colon with moderate stool noted upstream throughout the colon without frank dilation to suggest complete obstruction. 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 Cholelithiasis without CT evidence of acute cholecystitis. Known pancreatic cystic lesion is not well visualized on this examination. Aortic Atherosclerosis (ICD10-I70.0). Electronically Signed   By: Fidela Salisbury MD   On: 01/03/2020  03:08   DG CHEST PORT 1 VIEW  Result Date: 01/04/2020 CLINICAL DATA:  History of head neck cancer. History of colorectal cancer with metastases. EXAM: PORTABLE CHEST 1 VIEW COMPARISON:  January 03, 2020 FINDINGS: Stable right Port-A-Cath. No pneumothorax. The lungs are clear. The heart, hila, mediastinum are unremarkable. IMPRESSION: No active disease. Electronically Signed   By: Dorise Bullion III M.D   On: 01/04/2020 13:47   DG Chest Portable 1 View  Result Date: 01/03/2020 CLINICAL DATA:  Cough, fever, vomiting EXAM: PORTABLE CHEST 1 VIEW COMPARISON:  09/10/2019 FINDINGS: Lungs are clear. No pneumothorax or pleural effusion. Right internal jugular chest port tip seen within the right atrium. Coronary artery bypass grafting has been performed. Cardiac size within normal limits. Pulmonary vascularity is normal. No acute bone abnormality. IMPRESSION: And Electronically Signed   By: Fidela Salisbury MD   On: 01/03/2020  00:24   VAS [place] LOWER EXTREMITY VENOUS (DVT)  Result Date: 01/04/2020  Lower Venous DVT Study Indications: Dvt seen on ct.  Comparison Study: previous done 07/26/19 Performing Technologist: Abram Sander RVS  Examination Guidelines: A complete evaluation includes B-mode imaging, spectral Doppler, color Doppler, and power Doppler as needed of all accessible portions of each vessel. Bilateral testing is considered an integral part of a complete examination. Limited examinations for reoccurring indications may be performed as noted. The reflux portion of the exam is performed with the patient in reverse Trendelenburg.  +---------+---------------+---------+-----------+----------+-------------------+ RIGHT    CompressibilityPhasicitySpontaneityPropertiesThrombus Aging      +---------+---------------+---------+-----------+----------+-------------------+ CFV      Full           Yes      Yes                                       +---------+---------------+---------+-----------+----------+-------------------+ SFJ      Full                                                             +---------+---------------+---------+-----------+----------+-------------------+ FV Prox  Full                                                             +---------+---------------+---------+-----------+----------+-------------------+ FV Mid   Full                                                             +---------+---------------+---------+-----------+----------+-------------------+ FV DistalFull                                                             +---------+---------------+---------+-----------+----------+-------------------+ PFV      Full                                                             +---------+---------------+---------+-----------+----------+-------------------+ POP      Full           Yes      Yes                                      +---------+---------------+---------+-----------+----------+-------------------+ PTV      Full                                                             +---------+---------------+---------+-----------+----------+-------------------+  PERO                                                  Not well visualized +---------+---------------+---------+-----------+----------+-------------------+   +---------+---------------+---------+-----------+----------+-------------------+ LEFT     CompressibilityPhasicitySpontaneityPropertiesThrombus Aging      +---------+---------------+---------+-----------+----------+-------------------+ CFV      None           No       No                   Age Indeterminate   +---------+---------------+---------+-----------+----------+-------------------+ SFJ      Full                                                             +---------+---------------+---------+-----------+----------+-------------------+ FV  Prox  Partial                                      Age Indeterminate   +---------+---------------+---------+-----------+----------+-------------------+ FV Mid   Partial                                      Age Indeterminate   +---------+---------------+---------+-----------+----------+-------------------+ FV DistalPartial                                      Age Indeterminate   +---------+---------------+---------+-----------+----------+-------------------+ PFV      Partial                                      Age Indeterminate   +---------+---------------+---------+-----------+----------+-------------------+ POP      Partial        Yes      Yes                  Age Indeterminate   +---------+---------------+---------+-----------+----------+-------------------+ PTV      Full                                                             +---------+---------------+---------+-----------+----------+-------------------+ PERO                                                  Not well visualized +---------+---------------+---------+-----------+----------+-------------------+    Summary: RIGHT: - There is no evidence of deep vein thrombosis in the lower extremity.  - No cystic structure found in the popliteal fossa.  LEFT: - Findings consistent with age indeterminate deep vein thrombosis involving the left common femoral vein, left femoral vein, left proximal profunda vein, and left popliteal vein. - No cystic structure found in  the popliteal fossa.  *See table(s) above for measurements and observations. Electronically signed by Harold Barban MD on 01/04/2020 at 5:35:02 PM.    Final     Microbiology: Recent Results (from the past 240 hour(s))  Group A Strep by PCR     Status: None   Collection Time: 01/02/20 10:30 PM   Specimen: Throat; Sterile Swab  Result Value Ref Range Status   Group A Strep by PCR NOT DETECTED NOT DETECTED Final    Comment: Performed at [facility], [place] [address], [place], [place] [address]  Blood culture (routine x 2)     Status: None (Preliminary result)   Collection Time: 01/03/20  3:30 AM   Specimen: BLOOD  Result Value Ref Range Status   Specimen Description   Final    BLOOD RIGHT PORTA CATH Performed at [facility] [address] [place], [place] [address]    Special Requests   Final    BOTTLES DRAWN AEROBIC AND ANAEROBIC Blood Culture adequate volume Performed at [facility] [address], [place], [place] [address]    Culture   Final    NO GROWTH 4 DAYS Performed at [facility], [place] [address], [place], [place] [address]    Report Status PENDING  Incomplete  Resp Panel by RT-PCR (Flu A&B, Covid) Nasopharyngeal Swab     Status: None   Collection Time: 01/03/20  3:31 AM   Specimen: Nasopharyngeal Swab; Nasopharyngeal(NP) swabs in vial transport medium  Result Value Ref Range Status   SARS Coronavirus 2 by RT PCR NEGATIVE NEGATIVE Final    Comment: (NOTE) SARS-CoV-2 target nucleic acids are NOT DETECTED.  The SARS-CoV-2 RNA is generally detectable in upper respiratory specimens during the acute phase of infection. The lowest concentration of SARS-CoV-2 viral copies this assay can detect is 138 copies/mL. A negative result does not preclude SARS-Cov-2 infection and should not be used as the sole basis for treatment or other patient management decisions. A negative result may occur with  improper specimen collection/handling, submission of specimen other than nasopharyngeal swab, presence of viral mutation(s) within the areas targeted by this assay, and inadequate number of viral copies(<138 copies/mL). A negative result must be combined with clinical observations, patient history, and epidemiological information. The expected result is Negative.  Fact Sheet for Patients:  EntrepreneurPulse.com.au  Fact Sheet for Healthcare  Providers:  IncredibleEmployment.be  This test is no t yet approved or cleared by the [place] FDA and  has been authorized for detection and/or diagnosis of SARS-CoV-2 by FDA under an Emergency Use Authorization (EUA). This EUA will remain  in effect (meaning this test can be used) for the duration of the COVID-19 declaration under Section 564(b)(1) of the Act, 21 U.S.C.section 360bbb-3(b)(1), unless the authorization is terminated  or revoked sooner.       Influenza A by PCR NEGATIVE NEGATIVE Final   Influenza B by PCR NEGATIVE NEGATIVE Final    Comment: (NOTE) The Xpert Xpress SARS-CoV-2/FLU/RSV plus assay is intended as an aid in the diagnosis of influenza from Nasopharyngeal swab specimens and should not be used as a sole basis for treatment. Nasal washings and aspirates are unacceptable for Xpert Xpress SARS-CoV-2/FLU/RSV testing.  Fact Sheet for Patients: EntrepreneurPulse.com.au  Fact Sheet for Healthcare Providers: IncredibleEmployment.be  This test is not yet approved or cleared by the [place] FDA and has been authorized for detection and/or diagnosis of SARS-CoV-2 by FDA under an Emergency Use Authorization (  EUA). This EUA will remain in effect (meaning this test can be used) for the duration of the COVID-19 declaration under Section 564(b)(1) of the Act, 21 U.S.C. section 360bbb-3(b)(1), unless the authorization is terminated or revoked.  Performed at [facility], [place] [address] [place], [place] [address]   Blood culture (routine x 2)     Status: None (Preliminary result)   Collection Time: 01/03/20  3:35 AM   Specimen: BLOOD  Result Value Ref Range Status   Specimen Description   Final    BLOOD RIGHT WRIST Performed at [facility] [address] [place], [place] [address]    Special Requests   Final    BOTTLES DRAWN AEROBIC AND ANAEROBIC Blood Culture  adequate volume Performed at [facility] [address] [place], [place] [address]    Culture   Final    NO GROWTH 4 DAYS Performed at [facility], [place] [address] [place], [place] [address]    Report Status PENDING  Incomplete  SARS Coronavirus 2 by RT PCR (hospital order, performed in [facility] hospital lab) Nasopharyngeal Nasopharyngeal Swab     Status: None   Collection Time: 01/07/20  8:54 AM   Specimen: Nasopharyngeal Swab  Result Value Ref Range Status   SARS Coronavirus 2 NEGATIVE NEGATIVE Final    Comment: (NOTE) SARS-CoV-2 target nucleic acids are NOT DETECTED.  The SARS-CoV-2 RNA is generally detectable in upper and lower respiratory specimens during the acute phase of infection. The lowest concentration of SARS-CoV-2 viral copies this assay can detect is 250 copies / mL. A negative result does not preclude SARS-CoV-2 infection and should not be used as the sole basis for treatment or other patient management decisions.  A negative result may occur with improper specimen collection / handling, submission of specimen other than nasopharyngeal swab, presence of viral mutation(s) within the areas targeted by this assay, and inadequate number of viral copies (<250 copies / mL). A negative result must be combined with clinical observations, patient history, and epidemiological information.  Fact Sheet for Patients:   StrictlyIdeas.no  Fact Sheet for Healthcare Providers: BankingDealers.co.za  This test is not yet approved or  cleared by the [place] FDA and has been authorized for detection and/or diagnosis of SARS-CoV-2 by FDA under an Emergency Use Authorization (EUA).  This EUA will remain in effect (meaning this test can be used) for the duration of the COVID-19 declaration under Section 564(b)(1) of the Act, 21 U.S.C. section 360bbb-3(b)(1), unless the authorization is terminated  or revoked sooner.  Performed at [facility], [place] [address], [place], [place] [address]      Labs: CBC: Recent Labs  Lab 01/02/20 2215 01/02/20 2215 01/03/20 0803 01/04/20 0315 01/05/20 0402 01/06/20 0507 01/07/20 0500  WBC 13.9*   < > 13.9* 16.0* 14.7* 12.5* 11.0*  NEUTROABS 11.9*  --  12.5*  --   --   --   --   HGB 10.3*   < > 9.2* 8.8* 7.7* 7.8* 8.0*  HCT 33.7*   < > 30.3* 28.5* 25.2* 25.7* 26.1*  MCV 85.3   < > 85.8 84.8 85.1 86.5 85.9  PLT 360   < > 314 336 238 201 222   < > = values in this interval not displayed.   Basic Metabolic Panel: Recent Labs  Lab 01/03/20 0803 01/04/20 0315 01/05/20 0402 01/06/20 0507 01/07/20 0500  NA 140 137 139 138 135  K 3.8  3.2* 3.8 3.7 3.6  CL 101 100 101 101 100  CO2 27 27 28 28 28   GLUCOSE 267* 112* 106* 105* 142*  BUN 20 15 17 18 18   CREATININE 1.06 0.82 1.11 0.98 1.00  CALCIUM 8.3* 8.0* 7.9* 7.8* 7.9*  MG 1.4* 1.5*  --   --   --    Liver Function Tests: Recent Labs  Lab 01/02/20 2215 01/03/20 0803  AST 19 16  ALT 14 12  ALKPHOS 111 99  BILITOT 0.8 0.6  PROT 6.5 6.1*  ALBUMIN 2.9* 2.8*   CBG: Recent Labs  Lab 01/06/20 1159 01/06/20 1609 01/06/20 2256 01/07/20 0051 01/07/20 0516  GLUCAP 109* 119* 134* 165* 140*    Time spent: 35 minutes  Signed:  Berle Mull  [facility]  01/07/2020 11:13 AM

## 2020-01-07 NOTE — Care Management Important Message (Signed)
Important Message  Patient Details IM Letter given to the Patient. Name: Clarence Andrews. MRN: [id number] Date of Birth: Jun 25, 1950   Medicare Important Message Given:  Yes     Kerin Salen 01/07/2020, 10:57 AM

## 2020-01-07 NOTE — Progress Notes (Addendum)
  Speech Language Pathology Treatment: Dysphagia  Patient Details Name: Clarence Andrews. MRN: [id number] DOB: Sep 22, 1950 Today's Date: 01/07/2020 Time: 0017-4944 SLP Time Calculation (min) (ACUTE ONLY): 11 min  Assessment / Plan / Recommendation Clinical Impression  Pt today reports ongoing improvement with odynophagia but reports continued improvement.  He denies discomfort unless he is swallowing.    SLP questions potential esophagitis given his chemo tx and n/v therefore provided him with potential compensation strategies to mitigate discomfort.  Reviewed list verbally using teach back for 3 primary points and provided Heimlich manuever information.  Pt reports improvement with discomfort when using room temp or warm liquids - SLP advised he continue this practice.  Also advised that he consider asking for magic moutwash before he is to eat his meals to try to abate discomfort with swallowing. Requested RN to assist with timing of magic mouthwash as able.  Pt advised his tea was too hot to drink and did not desire to consume po during session.  Further advised pt to keep his cough/hock strength for airway protection.  No SLP follow up indicated as pt's odynophagia is improving and mitigation strategies reviewed.  Thanks for allowing me to help care for this pt.     HPI HPI: Clarence Fare. is a 69 y.o. male with medical history significant of DM2, orthostatic hypotension, PAF.  Throat CA in remission, Metastatic colon CA with mets to liver, s/p palliative stent to colon, 12 cycles of FOLFOX and the cancer progressed further based on CT earlier this month. Patient presents to the ED with c/o 5 episodes of N/V today.  Also has diarrhea ongoing for weeks.  Emesis is NBNB.  No abd pain he reports. Also has L sided neck pain, sore throat, and dysphagia for past few weeks.  Sharp and stabbing pain.  Worse with eating and drinking.  Has some lesions on tongue, concerned his throat CA may be coming back.  CT head/neck WNL.      SLP Plan  All goals met       Recommendations  Diet recommendations: Regular;Thin liquid (to allow pt to choose items he may manage well) Medication Administration: Other (Comment) (as tolerated) Compensations: Slow rate;Small sips/bites;Other (Comment) (start intake with liquids) Postural Changes and/or Swallow Maneuvers: Seated upright 90 degrees                Oral Care Recommendations: Oral care BID;Patient independent with oral care Follow up Recommendations: Other (comment);24 hour supervision/assistance SLP Visit Diagnosis: Dysphagia, unspecified (R13.10) Plan: All goals met       GO                Clarence Andrews 01/07/2020, 10:12 AM Clarence Lime, MS [facility] SLP Acute Rehab Services Office [phone] Pager [phone]

## 2020-01-07 NOTE — Progress Notes (Signed)
   01/06/20 2221  Assess: MEWS Score  Temp 99.4 F (37.4 C)  BP 115/79  Pulse Rate (!) 136  Resp 16  SpO2 95 %  O2 Device Nasal Cannula  Assess: MEWS Score  MEWS Temp 0  MEWS Systolic 0  MEWS Pulse 3  MEWS RR 0  MEWS LOC 0  MEWS Score 3  MEWS Score Color Yellow  Assess: if the MEWS score is Yellow or Red  Were vital signs taken at a resting state? Yes  Focused Assessment No change from prior assessment  Early Detection of Sepsis Score *See Row Information* Low  MEWS guidelines implemented *See Row Information* No, previously yellow, continue vital signs every 4 hours  Treat  MEWS Interventions Administered prn meds/treatments  Take Vital Signs  Increase Vital Sign Frequency  Yellow: Q 2hr X 2 then Q 4hr X 2, if remains yellow, continue Q 4hrs  Escalate  MEWS: Escalate Yellow: discuss with charge nurse/RN and consider discussing with provider and RRT  Notify: Charge Nurse/RN  Name of Charge Nurse/RN Notified  (N/A; I am charge Therapist, sports. Other RN's informed during huddle.)  Date Charge Nurse/RN Notified 01/06/20  Time Charge Nurse/RN Notified 1900  Notify: Provider  Provider Name/Title  Sharlet Salina)  Date Provider Notified 01/06/20  Time Provider Notified 2230  Notification Type Page  Notification Reason Other (Comment) (HR elevated higher than recent baseline; slight temp. )  Response See new orders  Date of Provider Response 01/07/20  Time of Provider Response 0025  Notify: Rapid Response  Name of Rapid Response RN Notified  (n/a)  Document  Patient Outcome Stabilized after interventions  Progress note created (see row info) Yes

## 2020-01-07 NOTE — TOC Transition Note (Signed)
Transition of Care [facility]) - CM/SW Discharge Note   Patient Details  Name: Clarence Andrews. MRN: [id number] Date of Birth: 09-17-1950  Transition of Care [facility]) CM/SW Contact:  Cory Rama, Marjie Skiff, RN Phone Number: 01/07/2020, 11:57 AM   Clinical Narrative:     Pt to dc back to [facility] today. Yellow DNR on chart for transport. RN to call report to [phone]. Pt to go to room 208B. DC summary sent via hub. [facility] contacted to pick up pt.  Final next level of care: Long Term Nursing Home Barriers to Discharge: Continued Medical Work up   Discharge Plan and Services   Discharge Planning Services: CM Consult             Social Determinants of Health (SDOH) Interventions     Readmission Risk Interventions Readmission Risk Prevention Plan 01/06/2020  Transportation Screening Complete  Medication Review [employer]) Complete  PCP or Specialist appointment within 3-5 days of discharge Complete  HRI or [place] Complete  SW Recovery Care/Counseling Consult Complete  Palliative Care Screening Complete  Skilled Nursing Facility Complete

## 2020-01-07 NOTE — Progress Notes (Signed)
Attempted to call to give report to [facility]. First time was on hold for 5 min. Attempted to call x2 more times and no answer. Will attempt to call again. [facility] has arrived to transport patient to facility.

## 2020-01-08 LAB — CULTURE, BLOOD (ROUTINE X 2)
Culture: NO GROWTH
Culture: NO GROWTH
Special Requests: ADEQUATE
Special Requests: ADEQUATE

## 2020-01-09 ENCOUNTER — Other Ambulatory Visit: Payer: Medicare Other

## 2020-01-09 ENCOUNTER — Inpatient Hospital Stay: Payer: Medicare Other

## 2020-01-09 ENCOUNTER — Ambulatory Visit: Payer: Medicare Other | Admitting: Nurse Practitioner

## 2020-01-10 LAB — GLUCOSE, CAPILLARY: Glucose-Capillary: 123 mg/dL — ABNORMAL HIGH (ref 70–99)

## 2020-01-11 ENCOUNTER — Inpatient Hospital Stay: Payer: Medicare Other

## 2020-01-21 ENCOUNTER — Inpatient Hospital Stay: Payer: Medicare Other | Attending: Oncology | Admitting: Oncology

## 2020-01-21 ENCOUNTER — Telehealth: Payer: Self-pay | Admitting: *Deleted

## 2020-01-21 NOTE — Telephone Encounter (Signed)
Called facility to check to see if he was en route to his 0930 appointment today. Nurse, Purcell Nails states they were not aware he had an appointment today. He is still in the bed and spends most of time there. He is refusing medications and only takes bites/sips oral intake. He is not being followed by Hospice there, but he thinks they are allowed there.

## 2020-01-22 ENCOUNTER — Telehealth: Payer: Self-pay | Admitting: *Deleted

## 2020-01-22 NOTE — Telephone Encounter (Signed)
Call from transportation coordinator at [facility] to reschedule his missed appointment. Due to time/date restrictions w/facility the 1st available is 02/21/20 at 10:00. Placed referral to [facility] w/[facility] for Palliative Team to see and follow patient in facility. She will F/U with facility. Dr. [name] prefers SNF physician be attending, but he will if SNF MD declines. Referral order faxed to Hospice at [phone].

## 2020-01-28 ENCOUNTER — Telehealth: Payer: Self-pay | Admitting: *Deleted

## 2020-01-28 NOTE — Telephone Encounter (Signed)
Called & spoke to [place] & Charles/Unit Director at [facility] informed that we have faxed several times but fax fails.  Verified fax # [phone] was correct.  Informed that f/u with Dr Truett Perna here on 02/21/20 @ 10 am & Toniann Fail in transportation is aware.  Also informed that Hospice Referral for palliative team made with Verdie Shire.  Dr Truett Perna is asking if [facility] physician would be attending.  Leonette Most states that usually he will but he is out of town & back on Monday.  He will verify then & let us know.

## 2020-02-05 ENCOUNTER — Telehealth: Payer: Self-pay | Admitting: *Deleted

## 2020-02-05 NOTE — Telephone Encounter (Signed)
Patient left message that he was told that there was blood in his diaper last night. Called facility and per nurse, Dimas Aguas it was a scan amount and has had none since.

## 2020-02-12 ENCOUNTER — Emergency Department (HOSPITAL_COMMUNITY)
Admission: EM | Admit: 2020-02-12 | Discharge: 2020-02-12 | Disposition: A | Discharge status: Home / Self Care | Payer: Medicare Other | Attending: Emergency Medicine | Admitting: Emergency Medicine

## 2020-02-12 ENCOUNTER — Encounter (HOSPITAL_COMMUNITY): Payer: Self-pay

## 2020-02-12 DIAGNOSIS — I2510 Atherosclerotic heart disease of native coronary artery without angina pectoris: Secondary | ICD-10-CM | POA: Diagnosis not present

## 2020-02-12 DIAGNOSIS — Z7901 Long term (current) use of anticoagulants: Secondary | ICD-10-CM | POA: Diagnosis not present

## 2020-02-12 DIAGNOSIS — E119 Type 2 diabetes mellitus without complications: Secondary | ICD-10-CM | POA: Insufficient documentation

## 2020-02-12 DIAGNOSIS — I5023 Acute on chronic systolic (congestive) heart failure: Secondary | ICD-10-CM | POA: Insufficient documentation

## 2020-02-12 DIAGNOSIS — Z951 Presence of aortocoronary bypass graft: Secondary | ICD-10-CM | POA: Diagnosis not present

## 2020-02-12 DIAGNOSIS — I480 Paroxysmal atrial fibrillation: Secondary | ICD-10-CM | POA: Insufficient documentation

## 2020-02-12 DIAGNOSIS — K625 Hemorrhage of anus and rectum: Secondary | ICD-10-CM | POA: Insufficient documentation

## 2020-02-12 DIAGNOSIS — Z85038 Personal history of other malignant neoplasm of large intestine: Secondary | ICD-10-CM | POA: Insufficient documentation

## 2020-02-12 DIAGNOSIS — Z85818 Personal history of malignant neoplasm of other sites of lip, oral cavity, and pharynx: Secondary | ICD-10-CM | POA: Insufficient documentation

## 2020-02-12 DIAGNOSIS — Z8616 Personal history of COVID-19: Secondary | ICD-10-CM | POA: Insufficient documentation

## 2020-02-12 LAB — PROTIME-INR
INR: 1.2 (ref 0.8–1.2)
Prothrombin Time: 15 seconds (ref 11.4–15.2)

## 2020-02-12 LAB — CBC WITH DIFFERENTIAL/PLATELET
Abs Immature Granulocytes: 0.01 10*3/uL (ref 0.00–0.07)
Basophils Absolute: 0.1 10*3/uL (ref 0.0–0.1)
Basophils Relative: 1 %
Eosinophils Absolute: 0.1 10*3/uL (ref 0.0–0.5)
Eosinophils Relative: 2 %
HCT: 25.5 % — ABNORMAL LOW (ref 39.0–52.0)
Hemoglobin: 7.5 g/dL — ABNORMAL LOW (ref 13.0–17.0)
Immature Granulocytes: 0 %
Lymphocytes Relative: 24 %
Lymphs Abs: 1.8 10*3/uL (ref 0.7–4.0)
MCH: 25 pg — ABNORMAL LOW (ref 26.0–34.0)
MCHC: 29.4 g/dL — ABNORMAL LOW (ref 30.0–36.0)
MCV: 85 fL (ref 80.0–100.0)
Monocytes Absolute: 0.9 10*3/uL (ref 0.1–1.0)
Monocytes Relative: 13 %
Neutro Abs: 4.5 10*3/uL (ref 1.7–7.7)
Neutrophils Relative %: 60 %
Platelets: 231 10*3/uL (ref 150–400)
RBC: 3 MIL/uL — ABNORMAL LOW (ref 4.22–5.81)
RDW: 15.7 % — ABNORMAL HIGH (ref 11.5–15.5)
WBC: 7.4 10*3/uL (ref 4.0–10.5)
nRBC: 0 % (ref 0.0–0.2)

## 2020-02-12 LAB — CBC
HCT: 21.7 % — ABNORMAL LOW (ref 39.0–52.0)
Hemoglobin: 6.5 g/dL — CL (ref 13.0–17.0)
MCH: 25.3 pg — ABNORMAL LOW (ref 26.0–34.0)
MCHC: 30 g/dL (ref 30.0–36.0)
MCV: 84.4 fL (ref 80.0–100.0)
Platelets: 170 10*3/uL (ref 150–400)
RBC: 2.57 MIL/uL — ABNORMAL LOW (ref 4.22–5.81)
RDW: 15.7 % — ABNORMAL HIGH (ref 11.5–15.5)
WBC: 5.9 10*3/uL (ref 4.0–10.5)
nRBC: 0 % (ref 0.0–0.2)

## 2020-02-12 LAB — BASIC METABOLIC PANEL
Anion gap: 11 (ref 5–15)
BUN: 24 mg/dL — ABNORMAL HIGH (ref 8–23)
CO2: 24 mmol/L (ref 22–32)
Calcium: 8.4 mg/dL — ABNORMAL LOW (ref 8.9–10.3)
Chloride: 103 mmol/L (ref 98–111)
Creatinine, Ser: 0.91 mg/dL (ref 0.61–1.24)
GFR, Estimated: >60 mL/min (ref >60)
Glucose, Bld: 173 mg/dL — ABNORMAL HIGH (ref 70–99)
Potassium: 3.5 mmol/L (ref 3.5–5.1)
Sodium: 138 mmol/L (ref 135–145)

## 2020-02-12 LAB — PREPARE RBC (CROSSMATCH)

## 2020-02-12 LAB — POC OCCULT BLOOD, ED: Fecal Occult Bld: POSITIVE — AB

## 2020-02-12 MED ORDER — SODIUM CHLORIDE 0.9 % IV SOLN
10.0000 mL/h | Freq: Once | INTRAVENOUS | Status: AC
  Administered 2020-02-12: 120 mL/h via INTRAVENOUS

## 2020-02-12 MED ORDER — LACTATED RINGERS IV BOLUS
500.0000 mL | Freq: Once | INTRAVENOUS | Status: AC
  Administered 2020-02-12: 500 mL via INTRAVENOUS

## 2020-02-12 MED ORDER — HEPARIN SOD (PORK) LOCK FLUSH 100 UNIT/ML IV SOLN
500.0000 [IU] | Freq: Once | INTRAVENOUS | Status: AC
  Administered 2020-02-12: 500 [IU]
  Filled 2020-02-12: qty 5

## 2020-02-12 NOTE — ED Notes (Signed)
Report called and given to the nurse at [facility] of [place], [place].

## 2020-02-12 NOTE — ED Notes (Signed)
Changed pt after having a bowel movement.  Several wounds noticed on sacral region.  Padded sacral dressing applied.  Will continue to monitor.

## 2020-02-12 NOTE — ED Notes (Signed)
Patient incontinent of stool; gown, bed linen and new brief applied. No other acute needs identified at this time.

## 2020-02-12 NOTE — ED Triage Notes (Signed)
Pt is from [facility] in [place], staff states that he's tested positive for blood in his stool twice this week however the staff say they've only seen it once and it was bright red Pt has hx of colon cancer and Afib, he takes eliquis  Pt has no complaints and denies any pain

## 2020-02-12 NOTE — Discharge Instructions (Addendum)
For now, until you are further instructed by GI or your primary doctor, I would recommend stopping your blood thinner, Eliquis.  If you have any further episodes of blood in stool or dark stools or feeling lightheaded or passing out, return to the emergency room for reassessment.

## 2020-02-12 NOTE — ED Notes (Signed)
Date and time results received: 02/12/20 0706  (use smartphrase ".now" to insert current time)  Test: Hgb Critical Value: 6.5  Name of Provider Notified: Dr. Christy Gentles  Orders Received? Or Actions Taken?: Yes
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
## 2020-02-12 NOTE — ED Provider Notes (Signed)
70 year old male with extensive past medical history including history of colon cancer, A. fib on Eliquis presenting with concern for GI bleed.  Very poor prognosis.  DNR but not in hospice care.  Dr. Christy Gentles recommending discharge after 1 unit PRBCs assuming he has no active ongoing bleeding.  7:30 AM received sign out from [place]  3:22 PM Reassessed patient, transfusion has been completed, patient is chronically ill-appearing but in no acute distress with stable vital signs, denies any ongoing episodes, will discharge back to nursing facility.  Discussed risk and benefits of taking Eliquis for stroke prevention given his anemia, concern for GI bleed.  For now at this time would recommend holding Eliquis.  Patient amenable to this.   Lucrezia Starch, MD 02/12/20 [phone]

## 2020-02-13 LAB — TYPE AND SCREEN
ABO/RH(D): B POS
Antibody Screen: NEGATIVE
Unit division: 0

## 2020-02-13 LAB — BPAM RBC
Blood Product Expiration Date: 202201282359
ISSUE DATE / TIME: 202201121035
Unit Type and Rh: 7300

## 2020-02-18 ENCOUNTER — Encounter (HOSPITAL_COMMUNITY): Payer: Self-pay | Admitting: Emergency Medicine

## 2020-02-18 ENCOUNTER — Other Ambulatory Visit: Payer: Self-pay

## 2020-02-18 ENCOUNTER — Inpatient Hospital Stay (HOSPITAL_COMMUNITY)
Admission: EM | Admit: 2020-02-18 | Discharge: 2020-02-26 | DRG: 375 | Disposition: A | Discharge status: Skilled Nursing Facility | Payer: Medicare Other | Source: Skilled Nursing Facility | Attending: Family Medicine | Admitting: Family Medicine

## 2020-02-18 DIAGNOSIS — C186 Malignant neoplasm of descending colon: Secondary | ICD-10-CM | POA: Diagnosis present

## 2020-02-18 DIAGNOSIS — Z803 Family history of malignant neoplasm of breast: Secondary | ICD-10-CM

## 2020-02-18 DIAGNOSIS — Z8249 Family history of ischemic heart disease and other diseases of the circulatory system: Secondary | ICD-10-CM

## 2020-02-18 DIAGNOSIS — Z8616 Personal history of COVID-19: Secondary | ICD-10-CM

## 2020-02-18 DIAGNOSIS — Z85819 Personal history of malignant neoplasm of unspecified site of lip, oral cavity, and pharynx: Secondary | ICD-10-CM

## 2020-02-18 DIAGNOSIS — I4891 Unspecified atrial fibrillation: Secondary | ICD-10-CM | POA: Diagnosis present

## 2020-02-18 DIAGNOSIS — D499 Neoplasm of unspecified behavior of unspecified site: Secondary | ICD-10-CM

## 2020-02-18 DIAGNOSIS — D62 Acute posthemorrhagic anemia: Secondary | ICD-10-CM | POA: Diagnosis present

## 2020-02-18 DIAGNOSIS — Z8371 Family history of colonic polyps: Secondary | ICD-10-CM

## 2020-02-18 DIAGNOSIS — R197 Diarrhea, unspecified: Secondary | ICD-10-CM | POA: Diagnosis present

## 2020-02-18 DIAGNOSIS — C19 Malignant neoplasm of rectosigmoid junction: Principal | ICD-10-CM | POA: Diagnosis present

## 2020-02-18 DIAGNOSIS — C189 Malignant neoplasm of colon, unspecified: Secondary | ICD-10-CM

## 2020-02-18 DIAGNOSIS — K922 Gastrointestinal hemorrhage, unspecified: Secondary | ICD-10-CM

## 2020-02-18 DIAGNOSIS — I110 Hypertensive heart disease with heart failure: Secondary | ICD-10-CM | POA: Diagnosis present

## 2020-02-18 DIAGNOSIS — Z66 Do not resuscitate: Secondary | ICD-10-CM | POA: Diagnosis present

## 2020-02-18 DIAGNOSIS — C787 Secondary malignant neoplasm of liver and intrahepatic bile duct: Secondary | ICD-10-CM | POA: Diagnosis present

## 2020-02-18 DIAGNOSIS — E119 Type 2 diabetes mellitus without complications: Secondary | ICD-10-CM | POA: Diagnosis present

## 2020-02-18 DIAGNOSIS — K625 Hemorrhage of anus and rectum: Secondary | ICD-10-CM | POA: Diagnosis not present

## 2020-02-18 DIAGNOSIS — Z951 Presence of aortocoronary bypass graft: Secondary | ICD-10-CM

## 2020-02-18 DIAGNOSIS — E876 Hypokalemia: Secondary | ICD-10-CM | POA: Diagnosis present

## 2020-02-18 DIAGNOSIS — I2510 Atherosclerotic heart disease of native coronary artery without angina pectoris: Secondary | ICD-10-CM | POA: Diagnosis present

## 2020-02-18 DIAGNOSIS — I5023 Acute on chronic systolic (congestive) heart failure: Secondary | ICD-10-CM | POA: Diagnosis present

## 2020-02-18 HISTORY — DX: Unspecified atrial flutter: I48.92

## 2020-02-18 LAB — I-STAT CHEM 8, ED
BUN: 19 mg/dL (ref 8–23)
Calcium, Ion: 1.14 mmol/L — ABNORMAL LOW (ref 1.15–1.40)
Chloride: 103 mmol/L (ref 98–111)
Creatinine, Ser: 0.8 mg/dL (ref 0.61–1.24)
Glucose, Bld: 170 mg/dL — ABNORMAL HIGH (ref 70–99)
HCT: 23 % — ABNORMAL LOW (ref 39.0–52.0)
Hemoglobin: 7.8 g/dL — ABNORMAL LOW (ref 13.0–17.0)
Potassium: 3.1 mmol/L — ABNORMAL LOW (ref 3.5–5.1)
Sodium: 140 mmol/L (ref 135–145)
TCO2: 24 mmol/L (ref 22–32)

## 2020-02-18 LAB — COMPREHENSIVE METABOLIC PANEL
ALT: 12 U/L (ref 0–44)
AST: 16 U/L (ref 15–41)
Albumin: 2.3 g/dL — ABNORMAL LOW (ref 3.5–5.0)
Alkaline Phosphatase: 172 U/L — ABNORMAL HIGH (ref 38–126)
Anion gap: 7 (ref 5–15)
BUN: 21 mg/dL (ref 8–23)
CO2: 26 mmol/L (ref 22–32)
Calcium: 8 mg/dL — ABNORMAL LOW (ref 8.9–10.3)
Chloride: 106 mmol/L (ref 98–111)
Creatinine, Ser: 0.84 mg/dL (ref 0.61–1.24)
GFR, Estimated: >60 mL/min (ref >60)
Glucose, Bld: 179 mg/dL — ABNORMAL HIGH (ref 70–99)
Potassium: 3.1 mmol/L — ABNORMAL LOW (ref 3.5–5.1)
Sodium: 139 mmol/L (ref 135–145)
Total Bilirubin: 0.8 mg/dL (ref 0.3–1.2)
Total Protein: 5.6 g/dL — ABNORMAL LOW (ref 6.5–8.1)

## 2020-02-18 LAB — CBC WITH DIFFERENTIAL/PLATELET
Abs Immature Granulocytes: 0.02 10*3/uL (ref 0.00–0.07)
Basophils Absolute: 0 10*3/uL (ref 0.0–0.1)
Basophils Relative: 0 %
Eosinophils Absolute: 0.1 10*3/uL (ref 0.0–0.5)
Eosinophils Relative: 1 %
HCT: 23.7 % — ABNORMAL LOW (ref 39.0–52.0)
Hemoglobin: 7.1 g/dL — ABNORMAL LOW (ref 13.0–17.0)
Immature Granulocytes: 0 %
Lymphocytes Relative: 19 %
Lymphs Abs: 1.9 10*3/uL (ref 0.7–4.0)
MCH: 25.6 pg — ABNORMAL LOW (ref 26.0–34.0)
MCHC: 30 g/dL (ref 30.0–36.0)
MCV: 85.6 fL (ref 80.0–100.0)
Monocytes Absolute: 0.9 10*3/uL (ref 0.1–1.0)
Monocytes Relative: 9 %
Neutro Abs: 6.9 10*3/uL (ref 1.7–7.7)
Neutrophils Relative %: 71 %
Platelets: 225 10*3/uL (ref 150–400)
RBC: 2.77 MIL/uL — ABNORMAL LOW (ref 4.22–5.81)
RDW: 16 % — ABNORMAL HIGH (ref 11.5–15.5)
WBC: 9.8 10*3/uL (ref 4.0–10.5)
nRBC: 0 % (ref 0.0–0.2)

## 2020-02-18 LAB — PROTIME-INR
INR: 1.3 — ABNORMAL HIGH (ref 0.8–1.2)
Prothrombin Time: 16 seconds — ABNORMAL HIGH (ref 11.4–15.2)

## 2020-02-18 LAB — LIPASE, BLOOD: Lipase: 19 U/L (ref 11–51)

## 2020-02-18 MED ORDER — SODIUM CHLORIDE 0.9% IV SOLUTION: Freq: Once | INTRAVENOUS | Status: DC

## 2020-02-18 MED ORDER — SODIUM CHLORIDE 0.9 % IV BOLUS
1000.0000 mL | Freq: Once | INTRAVENOUS | Status: AC
  Administered 2020-02-18: 1000 mL via INTRAVENOUS
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
## 2020-02-18 NOTE — ED Triage Notes (Signed)
Pt BIB EMS from [facility] in [place] c/o bloody stools. Staff requesting pt be evaluated. Pt has no complaints. Hx of colon cancer.

## 2020-02-19 ENCOUNTER — Emergency Department (HOSPITAL_COMMUNITY): Payer: Medicare Other

## 2020-02-19 ENCOUNTER — Encounter (HOSPITAL_COMMUNITY): Payer: Self-pay

## 2020-02-19 DIAGNOSIS — E119 Type 2 diabetes mellitus without complications: Secondary | ICD-10-CM | POA: Diagnosis present

## 2020-02-19 DIAGNOSIS — Z8249 Family history of ischemic heart disease and other diseases of the circulatory system: Secondary | ICD-10-CM | POA: Diagnosis not present

## 2020-02-19 DIAGNOSIS — C787 Secondary malignant neoplasm of liver and intrahepatic bile duct: Secondary | ICD-10-CM | POA: Diagnosis present

## 2020-02-19 DIAGNOSIS — I4891 Unspecified atrial fibrillation: Secondary | ICD-10-CM | POA: Diagnosis not present

## 2020-02-19 DIAGNOSIS — C189 Malignant neoplasm of colon, unspecified: Secondary | ICD-10-CM | POA: Diagnosis not present

## 2020-02-19 DIAGNOSIS — E876 Hypokalemia: Secondary | ICD-10-CM | POA: Diagnosis present

## 2020-02-19 DIAGNOSIS — Z951 Presence of aortocoronary bypass graft: Secondary | ICD-10-CM | POA: Diagnosis not present

## 2020-02-19 DIAGNOSIS — Z8616 Personal history of COVID-19: Secondary | ICD-10-CM | POA: Diagnosis not present

## 2020-02-19 DIAGNOSIS — Z803 Family history of malignant neoplasm of breast: Secondary | ICD-10-CM | POA: Diagnosis not present

## 2020-02-19 DIAGNOSIS — Z7189 Other specified counseling: Secondary | ICD-10-CM | POA: Diagnosis not present

## 2020-02-19 DIAGNOSIS — I2510 Atherosclerotic heart disease of native coronary artery without angina pectoris: Secondary | ICD-10-CM | POA: Diagnosis present

## 2020-02-19 DIAGNOSIS — Z515 Encounter for palliative care: Secondary | ICD-10-CM | POA: Diagnosis not present

## 2020-02-19 DIAGNOSIS — K625 Hemorrhage of anus and rectum: Secondary | ICD-10-CM | POA: Diagnosis present

## 2020-02-19 DIAGNOSIS — C19 Malignant neoplasm of rectosigmoid junction: Secondary | ICD-10-CM | POA: Diagnosis present

## 2020-02-19 DIAGNOSIS — Z8371 Family history of colonic polyps: Secondary | ICD-10-CM | POA: Diagnosis not present

## 2020-02-19 DIAGNOSIS — Z85819 Personal history of malignant neoplasm of unspecified site of lip, oral cavity, and pharynx: Secondary | ICD-10-CM | POA: Diagnosis not present

## 2020-02-19 DIAGNOSIS — C186 Malignant neoplasm of descending colon: Secondary | ICD-10-CM | POA: Diagnosis not present

## 2020-02-19 DIAGNOSIS — I110 Hypertensive heart disease with heart failure: Secondary | ICD-10-CM | POA: Diagnosis present

## 2020-02-19 DIAGNOSIS — R197 Diarrhea, unspecified: Secondary | ICD-10-CM | POA: Diagnosis present

## 2020-02-19 DIAGNOSIS — Z66 Do not resuscitate: Secondary | ICD-10-CM | POA: Diagnosis present

## 2020-02-19 DIAGNOSIS — R531 Weakness: Secondary | ICD-10-CM | POA: Diagnosis not present

## 2020-02-19 DIAGNOSIS — D62 Acute posthemorrhagic anemia: Secondary | ICD-10-CM | POA: Diagnosis present

## 2020-02-19 DIAGNOSIS — K922 Gastrointestinal hemorrhage, unspecified: Secondary | ICD-10-CM | POA: Diagnosis not present

## 2020-02-19 LAB — CBC
HCT: 25.1 % — ABNORMAL LOW (ref 39.0–52.0)
HCT: 24.7 % — ABNORMAL LOW (ref 39.0–52.0)
Hemoglobin: 7.8 g/dL — ABNORMAL LOW (ref 13.0–17.0)
Hemoglobin: 7.7 g/dL — ABNORMAL LOW (ref 13.0–17.0)
MCH: 25.9 pg — ABNORMAL LOW (ref 26.0–34.0)
MCH: 26.2 pg (ref 26.0–34.0)
MCHC: 31.1 g/dL (ref 30.0–36.0)
MCHC: 31.2 g/dL (ref 30.0–36.0)
MCV: 83.4 fL (ref 80.0–100.0)
MCV: 84 fL (ref 80.0–100.0)
Platelets: 197 10*3/uL (ref 150–400)
Platelets: 227 10*3/uL (ref 150–400)
RBC: 3.01 MIL/uL — ABNORMAL LOW (ref 4.22–5.81)
RBC: 2.94 MIL/uL — ABNORMAL LOW (ref 4.22–5.81)
RDW: 16.4 % — ABNORMAL HIGH (ref 11.5–15.5)
RDW: 16.5 % — ABNORMAL HIGH (ref 11.5–15.5)
WBC: 9 10*3/uL (ref 4.0–10.5)
WBC: 8.5 10*3/uL (ref 4.0–10.5)
nRBC: 0 % (ref 0.0–0.2)
nRBC: 0 % (ref 0.0–0.2)

## 2020-02-19 LAB — PREPARE RBC (CROSSMATCH)

## 2020-02-19 LAB — COMPREHENSIVE METABOLIC PANEL
ALT: 11 U/L (ref 0–44)
AST: 16 U/L (ref 15–41)
Albumin: 2.2 g/dL — ABNORMAL LOW (ref 3.5–5.0)
Alkaline Phosphatase: 163 U/L — ABNORMAL HIGH (ref 38–126)
Anion gap: 8 (ref 5–15)
BUN: 21 mg/dL (ref 8–23)
CO2: 24 mmol/L (ref 22–32)
Calcium: 7.8 mg/dL — ABNORMAL LOW (ref 8.9–10.3)
Chloride: 108 mmol/L (ref 98–111)
Creatinine, Ser: 0.9 mg/dL (ref 0.61–1.24)
GFR, Estimated: >60 mL/min (ref >60)
Glucose, Bld: 171 mg/dL — ABNORMAL HIGH (ref 70–99)
Potassium: 2.8 mmol/L — ABNORMAL LOW (ref 3.5–5.1)
Sodium: 140 mmol/L (ref 135–145)
Total Bilirubin: 0.9 mg/dL (ref 0.3–1.2)
Total Protein: 5.4 g/dL — ABNORMAL LOW (ref 6.5–8.1)

## 2020-02-19 LAB — MAGNESIUM: Magnesium: 1.3 mg/dL — ABNORMAL LOW (ref 1.7–2.4)

## 2020-02-19 LAB — SARS CORONAVIRUS 2 (TAT 6-24 HRS): SARS Coronavirus 2: NEGATIVE

## 2020-02-19 MED ORDER — POTASSIUM CHLORIDE CRYS ER 20 MEQ PO TBCR
40.0000 meq | EXTENDED_RELEASE_TABLET | ORAL | Status: AC
  Administered 2020-02-19 (×3): 40 meq via ORAL
  Filled 2020-02-19 (×3): qty 2

## 2020-02-19 MED ORDER — PANTOPRAZOLE SODIUM 40 MG IV SOLR: 40.0000 mg | Freq: Two times a day (BID) | INTRAVENOUS | Status: DC

## 2020-02-19 MED ORDER — DIGOXIN 125 MCG PO TABS
0.2500 mg | ORAL_TABLET | Freq: Every day | ORAL | Status: DC
  Administered 2020-02-19 – 2020-02-26 (×8): 0.25 mg via ORAL
  Filled 2020-02-19 (×3): qty 2
  Filled 2020-02-19: qty 1
  Filled 2020-02-19 (×4): qty 2

## 2020-02-19 MED ORDER — PANTOPRAZOLE SODIUM 20 MG PO TBEC
20.0000 mg | DELAYED_RELEASE_TABLET | Freq: Two times a day (BID) | ORAL | Status: DC
  Administered 2020-02-19 – 2020-02-26 (×13): 20 mg via ORAL
  Filled 2020-02-19 (×15): qty 1

## 2020-02-19 MED ORDER — IOHEXOL 350 MG/ML SOLN
100.0000 mL | Freq: Once | INTRAVENOUS | Status: AC | PRN
  Administered 2020-02-19: 100 mL via INTRAVENOUS

## 2020-02-19 MED ORDER — MORPHINE SULFATE (PF) 2 MG/ML IV SOLN: 2.0000 mg | INTRAVENOUS | Status: DC | PRN

## 2020-02-19 MED ORDER — BISACODYL 5 MG PO TBEC: 20.0000 mg | DELAYED_RELEASE_TABLET | Freq: Once | ORAL | Status: DC

## 2020-02-19 MED ORDER — MAGNESIUM SULFATE 2 GM/50ML IV SOLN
2.0000 g | Freq: Once | INTRAVENOUS | Status: AC
  Administered 2020-02-19: 2 g via INTRAVENOUS
  Filled 2020-02-19: qty 50

## 2020-02-19 MED ORDER — SODIUM CHLORIDE 0.9 % IV SOLN
80.0000 mg | Freq: Once | INTRAVENOUS | Status: AC
  Administered 2020-02-19: 80 mg via INTRAVENOUS
  Filled 2020-02-19: qty 80

## 2020-02-19 MED ORDER — MIDODRINE HCL 5 MG PO TABS
10.0000 mg | ORAL_TABLET | Freq: Three times a day (TID) | ORAL | Status: DC | PRN
  Filled 2020-02-19 (×2): qty 2

## 2020-02-19 MED ORDER — METOPROLOL TARTRATE 25 MG PO TABS
25.0000 mg | ORAL_TABLET | Freq: Every day | ORAL | Status: DC | PRN
Start: 2020-02-19 — End: 2020-02-26

## 2020-02-19 MED ORDER — OXYCODONE-ACETAMINOPHEN 5-325 MG PO TABS
1.0000 | ORAL_TABLET | ORAL | Status: DC | PRN
  Administered 2020-02-21: 1 via ORAL
  Filled 2020-02-19 (×2): qty 1

## 2020-02-19 MED ORDER — SODIUM CHLORIDE 0.9 % IV SOLN
8.0000 mg/h | INTRAVENOUS | Status: DC
  Administered 2020-02-19 (×2): 8 mg/h via INTRAVENOUS
  Filled 2020-02-19 (×2): qty 80

## 2020-02-19 NOTE — Progress Notes (Signed)
Attempted calls X 2 to ED for report.  Rik Wadel, Laurel Dimmer, RN

## 2020-02-19 NOTE — H&P (Signed)
History and Physical   [name] IRC:[id number] DOB: 10/11/1950 DOA: 02/18/2020  Referring MD/NP/PA: Dr. Billy Fischer  PCP: Patient, No Pcp Per   Outpatient Specialists: Dr. Betsy Coder, oncology  Patient coming from: Skilled facility  Chief Complaint: Rectal bleed  HPI: Clarence Andrews. is a 70 y.o. male with medical history significant of left-sided colon cancer status post multiple admissions last admission was a week ago with rectal bleed to the ER, history of aspiration pneumonia, coronary artery disease, coronary artery bypass grafting, colitis, diabetes, hypertension, previous COVID-19 infection among other things who was discharged to skilled facility back in December.  Patient came back here today secondary to multiple episodes of rectal bleed.  Patient reported being cleaned at least twice in the nursing facility and was here.  The bleeding is painless.  No associated hematemesis.  Denied any recent melena.  Patient's hemoglobin is notably down to 7.1.  CT angiogram of the abdomen is currently pending.  Patient has been transfused 1 unit of packed red blood cells in the ER and is being admitted to the hospital for further evaluation of GI bleed..  ED Course: Temperature 97.9 blood pressure 80/68 pulse 101 respiratory rate of 19 oxygen sats 100% room air.  Sodium 140 potassium 3.1 chloride 103 CO2 26.  Glucose is 170 BUN is 19 creatinine 1.04.  White count 9.8 hemoglobin 7.1 platelets 225.  CT angiogram of the abdomen and pelvis currently pending.  Patient will be admitted with rectal bleed.  GI will be consulted in the morning.  Review of Systems: As per HPI otherwise 10 point review of systems negative.    Past Medical History:  Diagnosis Date  . CAD (coronary artery disease) 2017   CABG  . Cancer ([place])   . CHF (congestive heart failure) ([place])   . Colitis   . COVID-19 04/29/2019  . Diabetes ([place])   . Paronychia of second toe of right foot   . Throat cancer [facility])      Past Surgical History:  Procedure Laterality Date  . APPENDECTOMY  1970  . COLONIC STENT PLACEMENT N/A 07/12/2019   Procedure: COLONIC STENT PLACEMENT;  Surgeon: Irving Copas., MD;  Location: [facility];  Service: Gastroenterology;  Laterality: N/A;  . CORONARY ARTERY BYPASS GRAFT     5 vessel   . FLEXIBLE SIGMOIDOSCOPY N/A 07/12/2019   Procedure: FLEXIBLE SIGMOIDOSCOPY;  Surgeon: Rush Landmark Telford Nab., MD;  Location: [facility];  Service: Gastroenterology;  Laterality: N/A;  . IR IMAGING GUIDED PORT INSERTION  04/04/2019  . TOOTH EXTRACTION       reports that he has never smoked. He has never used smokeless tobacco. He reports previous alcohol use. He reports that he does not use drugs.  No Known Allergies  Family History  Problem Relation Age of Onset  . Breast cancer Mother   . Colon polyps Father   . Heart disease Father   . Colon cancer Neg Hx   . Esophageal cancer Neg Hx   . Rectal cancer Neg Hx   . Stomach cancer Neg Hx      Prior to Admission medications   Medication Sig Start Date End Date Taking? Authorizing Provider  acetaminophen (TYLENOL) 325 MG tablet Take 650 mg by mouth every 6 (six) hours as needed for mild pain.    Yes [provider]  Amino Acids-Protein Hydrolys (FEEDING SUPPLEMENT, PRO-STAT 64,) LIQD Take 30 mLs by mouth daily.   Yes [provider]  ascorbic acid (VITAMIN C) 500  MG tablet Take 1 tablet (500 mg total) by mouth daily. 05/05/19  Yes Eugenie Filler, MD  bisacodyl (DULCOLAX) 10 MG suppository Place 1 suppository (10 mg total) rectally daily as needed for moderate constipation. 01/07/20  Yes Lavina Hamman, MD  calcium carbonate (OSCAL) 1500 (600 Ca) MG TABS tablet Take 1,500 mg by mouth 2 (two) times daily as needed (heartburn).    Yes [provider]  Cholecalciferol (D3-1000 PO) Take 1,000 Units by mouth daily.    Yes [provider]  digoxin (LANOXIN) 0.25 MG tablet Take 1 tablet (0.25 mg  total) by mouth daily. 07/25/19  Yes Samuella Cota, MD  docusate sodium (COLACE) 100 MG capsule Take 1 capsule (100 mg total) by mouth 2 (two) times daily. Patient taking differently: Take 100 mg by mouth 2 (two) times daily as needed for mild constipation. 01/07/20  Yes Lavina Hamman, MD  ferrous sulfate 325 (65 FE) MG EC tablet Take 325 mg by mouth daily with breakfast.   Yes [provider]  hyoscyamine (LEVSIN) 0.125 MG tablet Take 1 tablet (0.125 mg total) by mouth every 4 (four) hours as needed for cramping. 12/13/19  Yes Barb Merino, MD  levalbuterol [facility] HFA) 45 MCG/ACT inhaler Inhale 2 puffs into the lungs every 6 (six) hours as needed for wheezing. 05/04/19  Yes Eugenie Filler, MD  loperamide (IMODIUM) 2 MG capsule Take 1 capsule by mouth 4 (four) times daily as needed for diarrhea or loose stools.    Yes [provider]  magic mouthwash SOLN Take 15 mLs by mouth 4 (four) times daily. 01/07/20  Yes Lavina Hamman, MD  magnesium oxide (MAG-OX) 400 MG tablet Take 800 mg by mouth daily.    Yes [provider]  metoprolol tartrate (LOPRESSOR) 25 MG tablet Take 1 tablet (25 mg total) by mouth daily as needed (for Heart rate >120). 01/07/20  Yes Lavina Hamman, MD  midodrine (PROAMATINE) 10 MG tablet Take 1 tablet (10 mg total) by mouth 3 (three) times daily as needed (orthostatic. SBP<90). 01/07/20  Yes Lavina Hamman, MD  omeprazole (PRILOSEC) 20 MG capsule Take 1 capsule (20 mg total) by mouth 2 (two) times daily. 12/13/19 02/11/20 Yes Barb Merino, MD  oxyCODONE-acetaminophen (PERCOCET/ROXICET) 5-325 MG tablet Take 1 tablet by mouth every 4 (four) hours as needed for moderate pain. 01/07/20  Yes Lavina Hamman, MD  polyethylene glycol (MIRALAX / GLYCOLAX) 17 g packet Take 17 g by mouth daily. 01/07/20  Yes Lavina Hamman, MD  PROPYLENE GLYCOL OP Place 1 drop into both eyes as needed (dry eyes).    Yes [provider]  apixaban (ELIQUIS) 2.5 MG  TABS tablet 5 mg BID starting 01/07/2020 till 01/10/2020. Starting 01/11/2020 take 2.5 mg BID. Patient not taking: Reported on 02/19/2020 01/07/20   Lavina Hamman, MD  Infant Care Products [facility]) OINT Apply 1 application topically daily as needed (protectant).  12/13/19   [provider]    Physical Exam: Vitals:   02/18/20 2315 02/18/20 2330 02/18/20 2345 02/19/20 0000  BP: 124/85 124/79 132/79 124/85  Pulse:  95 95 (!) 101  Resp: 19 17 17 14   Temp:      TempSrc:      SpO2:  100% 100% 100%  Weight:      Height:          Constitutional: Acutely ill looking, no distress Vitals:   02/18/20 2315 02/18/20 2330 02/18/20 2345 02/19/20 0000  BP:  124/85 124/79 132/79 124/85  Pulse:  95 95 (!) 101  Resp: 19 17 17 14   Temp:      TempSrc:      SpO2:  100% 100% 100%  Weight:      Height:       Eyes: PERRL, lids and conjunctivae pale ENMT: Mucous membranes are moist. Posterior pharynx clear of any exudate or lesions.Normal dentition.  Neck: normal, supple, no masses, no thyromegaly Respiratory: clear to auscultation bilaterally, no wheezing, no crackles. Normal respiratory effort. No accessory muscle use.  Cardiovascular: Irregularly irregular with tachycardia no murmurs / rubs / gallops. No extremity edema. 2+ pedal pulses. No carotid bruits.  Abdomen: no tenderness, no masses palpated. No hepatosplenomegaly. Bowel sounds positive.  Musculoskeletal: no clubbing / cyanosis. No joint deformity upper and lower extremities. Good ROM, no contractures. Normal muscle tone.  Skin: no rashes, lesions, ulcers. No induration Neurologic: CN 2-12 grossly intact. Sensation intact, DTR normal. Strength 5/5 in all 4.  Psychiatric: Normal judgment and insight. Alert and oriented x 3. Normal mood.     Labs on Admission: I have personally reviewed following labs and imaging studies  CBC: Recent Labs  Lab 02/12/20 0316 02/12/20 0641 02/18/20 2245 02/18/20 2258  WBC 7.4 5.9 9.8   --   NEUTROABS 4.5  --  6.9  --   HGB 7.5* 6.5* 7.1* 7.8*  HCT 25.5* 21.7* 23.7* 23.0*  MCV 85.0 84.4 85.6  --   PLT 231 170 225  --    Basic Metabolic Panel: Recent Labs  Lab 02/12/20 0316 02/18/20 2245 02/18/20 2258  NA 138 139 140  K 3.5 3.1* 3.1*  CL 103 106 103  CO2 24 26  --   GLUCOSE 173* 179* 170*  BUN 24* 21 19  CREATININE 0.91 0.84 0.80  CALCIUM 8.4* 8.0*  --    GFR: Estimated Creatinine Clearance: 98.5 mL/min (by C-G formula based on SCr of 0.8 mg/dL). Liver Function Tests: Recent Labs  Lab 02/18/20 2245  AST 16  ALT 12  ALKPHOS 172*  BILITOT 0.8  PROT 5.6*  ALBUMIN 2.3*   Recent Labs  Lab 02/18/20 2245  LIPASE 19   No results for input(s): AMMONIA in the last 168 hours. Coagulation Profile: Recent Labs  Lab 02/12/20 0316 02/18/20 2245  INR 1.2 1.3*   Cardiac Enzymes: No results for input(s): CKTOTAL, CKMB, CKMBINDEX, TROPONINI in the last 168 hours. BNP (last 3 results) No results for input(s): PROBNP in the last 8760 hours. HbA1C: No results for input(s): HGBA1C in the last 72 hours. CBG: No results for input(s): GLUCAP in the last 168 hours. Lipid Profile: No results for input(s): CHOL, HDL, LDLCALC, TRIG, CHOLHDL, LDLDIRECT in the last 72 hours. Thyroid Function Tests: No results for input(s): TSH, T4TOTAL, FREET4, T3FREE, THYROIDAB in the last 72 hours. Anemia Panel: No results for input(s): VITAMINB12, FOLATE, FERRITIN, TIBC, IRON, RETICCTPCT in the last 72 hours. Urine analysis:    Component Value Date/Time   COLORURINE YELLOW 09/10/2019 2239   APPEARANCEUR CLOUDY (A) 09/10/2019 2239   LABSPEC 1.017 09/10/2019 2239   PHURINE 5.0 09/10/2019 2239   GLUCOSEU NEGATIVE 09/10/2019 2239   HGBUR LARGE (A) 09/10/2019 2239   BILIRUBINUR NEGATIVE 09/10/2019 2239   KETONESUR 5 (A) 09/10/2019 2239   PROTEINUR 100 (A) 09/10/2019 2239   NITRITE NEGATIVE 09/10/2019 2239   LEUKOCYTESUR LARGE (A) 09/10/2019 2239   Sepsis  Labs: @LABRCNTIP (procalcitonin:4,lacticidven:4) )No results found for this or any previous visit (from the past 240  hour(s)).   Radiological Exams on Admission: No results found.    Assessment/Plan Principal Problem:   Rectal bleed Active Problems:   Cancer of left colon (HCC)   Type 2 diabetes mellitus without complication (HCC)   Acute on chronic systolic CHF (congestive heart failure) (HCC)   Atrial fibrillation with RVR (HCC)   CAD (coronary artery disease)   Hypokalemia     #1 rectal bleed: Probably related to patient's colon cancer.  Could also be diverticular disease.  Could be other causes.  At this point we will monitor H&H every 6 hours.  IV Protonix.  Patient already ordered transfusion of 1 unit of packed red blood cells.  Will monitor closely.  GI to be consulted to see patient in the morning.  #2 left colon cancer: Defer to oncology  #3 diabetes: Sliding scale insulin  #4 hypokalemia: Replete potassium  #5 coronary artery disease: Appears stable at baseline.  Continue to monitor  #6 A. fib with mild RVR: Hold anticoagulation.  Rate control with IV medications  #7 history of CHF.  Systolic CHF noted.  Continue monitor  DVT prophylaxis: SCD Code Status: DNR Family Communication: No family at bedside Disposition Plan: Back to skilled facility Consults called: Consult GI in the morning Admission status: Inpatient  Severity of Illness: The appropriate patient status for this patient is INPATIENT. Inpatient status is judged to be reasonable and necessary in order to provide the required intensity of service to ensure the patient's safety. The patient's presenting symptoms, physical exam findings, and initial radiographic and laboratory data in the context of their chronic comorbidities is felt to place them at high risk for further clinical deterioration. Furthermore, it is not anticipated that the patient will be medically stable for discharge from the hospital  within 2 midnights of admission. The following factors support the patient status of inpatient.   " The patient's presenting symptoms include rectal bleed. " The worrisome physical exam findings include no abdominal tenderness. " The initial radiographic and laboratory data are worrisome because of hemoglobin 7.1. " The chronic co-morbidities include colon cancer.   * I certify that at the point of admission it is my clinical judgment that the patient will require inpatient hospital care spanning beyond 2 midnights from the point of admission due to high intensity of service, high risk for further deterioration and high frequency of surveillance required.Barbette Merino MD [facility] Pager [phone]  If 7PM-7AM, please contact night-coverage [email or web address] Password [facility]  02/19/2020, 12:41 AM

## 2020-02-19 NOTE — Progress Notes (Signed)
This is a 70 year old gentleman with a history of left-sided colon cancer status post chemotherapy with history of recent hospitalization secondary to aspiration pneumonia and also history of previous COVID-19 infection who presented from his skilled nursing facility with the complaint of rectal bleeding.  Per patient, he had 2 episodes of bleeding at nursing home.  Upon arrival, he was hemodynamically stable however his hemoglobin was down to 7.1 for which he received 1 unit of PRBC transfusion.  His blood pressure was also low upon arrival.  Patient seen and examined in the ED.  His blood pressure is much better.  He has no complaints.  Denies any abdominal pain.  Has not had any further rectal bleeding.  No history of nausea vomiting or hematemesis.  Posttransfusion hemoglobin is 7.8.  Came in with hypokalemia of 3.1, he was repleted but again his potassium is even further down to 2.8.  We will replete.  Magnesium is also 1.3 which I will replete as well.  We will recheck CBC later today.  I had discussed with the patient about possibility of need of colonoscopy.  He is agreeable to that and based on that, I have consulted GI and discussed with Dr. Carlean Purl.  On examination, he is alert and oriented, abdomen soft and nontender, lungs clear to auscultation.

## 2020-02-19 NOTE — ED Notes (Signed)
Attempted to give report, no answer, will try again in 10 mins.

## 2020-02-19 NOTE — Consult Note (Addendum)
Consultation  Referring Provider:     [facility] Primary Care Physician:  Patient, No Pcp Per Primary Gastroenterologist:       Danis Reason for Consultation:     Lower GI bleed     Impression / Plan:   Lower GI bleed in a patient with a history of proximal rectal status post colonic stenting June 2021, currently with hepatic metastases despite chemotherapy and has been on Eliquis  Chronic anemia and acute blood loss anemia  Question if there is ulceration at the site of his sigmoid stent which is causing bleeding especially in the setting of Eliquis.  Agree with holding anticoagulation and I will perform a flexible sigmoidoscopy tomorrow.  The risks and benefits as well as alternatives of endoscopic procedure(s) have been discussed and reviewed. All questions answered. The patient agrees to proceed.  I have discontinued intravenous pantoprazole and placed him on pantoprazole orally for a lower bleed and since he is on omeprazole 20 mg twice daily at home we will continue PPI.   HPI:   Clarence Andrews. is a 70 y.o. male with a history of stage IV colon cancer who is DNR, presenting with hematochezia father met.  Other medical history includes paroxysmal atrial fibrillation, coronary artery bypass grafting type 2 diabetes mellitus, orthostatic hypotension on midodrine.  He was in the emergency room on January 12 with hemoglobin 6.5 and he was transfused a unit of packed red blood cells his Eliquis was held and he was released and returned with more bleeding so he was admitted.  After 1 packed unit of red cells his hemoglobin rose from 6.5 on 1 12-7.1 on January 18 and it was [id number].  He was transfused an additional unit of packed red cells after arriving here which resulted in the rise from 7.1-7.8  He is observed in the presence of nursing staff and he has brown soft stool mixed with blood.  Bright red blood.  He does not have any pain.  He had been admitted in early  December with aspiration pneumonia.  His Eliquis is on hold.  He was diagnosed in February 2021 by Dr. Fuller Plan, had the stent placed in June 2021 by Dr. Rush Landmark.  He has been cared for by Dr. Minta Balsam regarding the rectal cancer with metastases.  He has been hospitalized 3 times including this 1 since June of last year.  He lives in a SNF.  Past Medical History:  Diagnosis Date  . CAD (coronary artery disease) 2017   CABG  . CHF (congestive heart failure) ([place])   . COVID-19 04/29/2019  . Diabetes ([place])   . Paronychia of second toe of right foot   . Throat cancer [facility])     Past Surgical History:  Procedure Laterality Date  . APPENDECTOMY  1970  . COLONIC STENT PLACEMENT N/A 07/12/2019   Procedure: COLONIC STENT PLACEMENT;  Surgeon: Irving Copas., MD;  Location: [facility];  Service: Gastroenterology;  Laterality: N/A;  . CORONARY ARTERY BYPASS GRAFT     5 vessel   . FLEXIBLE SIGMOIDOSCOPY N/A 07/12/2019   Procedure: FLEXIBLE SIGMOIDOSCOPY;  Surgeon: Rush Landmark Telford Nab., MD;  Location: [facility];  Service: Gastroenterology;  Laterality: N/A;  . IR IMAGING GUIDED PORT INSERTION  04/04/2019  . TOOTH EXTRACTION      Family History  Problem Relation Age of Onset  . Breast cancer Mother   . Colon polyps Father   . Heart disease Father   . Colon cancer Neg  Hx   . Esophageal cancer Neg Hx   . Rectal cancer Neg Hx   . Stomach cancer Neg Hx     Social History   Social History Narrative   Patient is single   Never smoker no drugs or alcohol at this time     Prior to Admission medications   Medication Sig Start Date End Date Taking? Authorizing Provider  acetaminophen (TYLENOL) 325 MG tablet Take 650 mg by mouth every 6 (six) hours as needed for mild pain.    Yes [provider]  Amino Acids-Protein Hydrolys (FEEDING SUPPLEMENT, PRO-STAT 64,) LIQD Take 30 mLs by mouth daily.   Yes [provider]  ascorbic acid (VITAMIN C) 500 MG tablet Take 1  tablet (500 mg total) by mouth daily. 05/05/19  Yes Eugenie Filler, MD  bisacodyl (DULCOLAX) 10 MG suppository Place 1 suppository (10 mg total) rectally daily as needed for moderate constipation. 01/07/20  Yes Lavina Hamman, MD  calcium carbonate (OSCAL) 1500 (600 Ca) MG TABS tablet Take 1,500 mg by mouth 2 (two) times daily as needed (heartburn).    Yes [provider]  Cholecalciferol (D3-1000 PO) Take 1,000 Units by mouth daily.    Yes [provider]  digoxin (LANOXIN) 0.25 MG tablet Take 1 tablet (0.25 mg total) by mouth daily. 07/25/19  Yes Samuella Cota, MD  docusate sodium (COLACE) 100 MG capsule Take 1 capsule (100 mg total) by mouth 2 (two) times daily. Patient taking differently: Take 100 mg by mouth 2 (two) times daily as needed for mild constipation. 01/07/20  Yes Lavina Hamman, MD  ferrous sulfate 325 (65 FE) MG EC tablet Take 325 mg by mouth daily with breakfast.   Yes [provider]  hyoscyamine (LEVSIN) 0.125 MG tablet Take 1 tablet (0.125 mg total) by mouth every 4 (four) hours as needed for cramping. 12/13/19  Yes Barb Merino, MD  levalbuterol [facility] HFA) 45 MCG/ACT inhaler Inhale 2 puffs into the lungs every 6 (six) hours as needed for wheezing. 05/04/19  Yes Eugenie Filler, MD  loperamide (IMODIUM) 2 MG capsule Take 1 capsule by mouth 4 (four) times daily as needed for diarrhea or loose stools.    Yes [provider]  magic mouthwash SOLN Take 15 mLs by mouth 4 (four) times daily. 01/07/20  Yes Lavina Hamman, MD  magnesium oxide (MAG-OX) 400 MG tablet Take 800 mg by mouth daily.    Yes [provider]  metoprolol tartrate (LOPRESSOR) 25 MG tablet Take 1 tablet (25 mg total) by mouth daily as needed (for Heart rate >120). 01/07/20  Yes Lavina Hamman, MD  midodrine (PROAMATINE) 10 MG tablet Take 1 tablet (10 mg total) by mouth 3 (three) times daily as needed (orthostatic. SBP<90). 01/07/20  Yes Lavina Hamman, MD   omeprazole (PRILOSEC) 20 MG capsule Take 1 capsule (20 mg total) by mouth 2 (two) times daily. 12/13/19 02/11/20 Yes Barb Merino, MD  oxyCODONE-acetaminophen (PERCOCET/ROXICET) 5-325 MG tablet Take 1 tablet by mouth every 4 (four) hours as needed for moderate pain. 01/07/20  Yes Lavina Hamman, MD  polyethylene glycol (MIRALAX / GLYCOLAX) 17 g packet Take 17 g by mouth daily. 01/07/20  Yes Lavina Hamman, MD  PROPYLENE GLYCOL OP Place 1 drop into both eyes as needed (dry eyes).    Yes [provider]  apixaban (ELIQUIS) 2.5 MG TABS tablet 5 mg BID starting 01/07/2020 till 01/10/2020. Starting 01/11/2020 take 2.5 mg BID. Patient  not taking: Reported on 02/19/2020 01/07/20   Lavina Hamman, MD  Infant Care Products [facility]) OINT Apply 1 application topically daily as needed (protectant).  12/13/19   [provider]    Current Facility-Administered Medications  Medication Dose Route Frequency Provider Last Rate Last Admin  . 0.9 %  sodium chloride infusion (Manually program via Guardrails IV Fluids)   Intravenous Once Gareth Morgan, MD   Held at 02/18/20 2329  . bisacodyl (DULCOLAX) EC tablet 20 mg  20 mg Oral Once Gatha Mayer, MD      . digoxin Fonnie Birkenhead) tablet 0.25 mg  0.25 mg Oral Daily Darliss Cheney, MD   0.25 mg at 02/19/20 0945  . metoprolol tartrate (LOPRESSOR) tablet 25 mg  25 mg Oral Daily PRN Darliss Cheney, MD      . midodrine (PROAMATINE) tablet 10 mg  10 mg Oral TID PRN Darliss Cheney, MD      . morphine 2 MG/ML injection 2 mg  2 mg Intravenous Q2H PRN Elwyn Reach, MD      . oxyCODONE-acetaminophen (PERCOCET/ROXICET) 5-325 MG per tablet 1 tablet  1 tablet Oral Q4H PRN Darliss Cheney, MD      . pantoprazole (PROTONIX) EC tablet 20 mg  20 mg Oral BID AC Gatha Mayer, MD       Current Outpatient Medications  Medication Sig Dispense Refill  . acetaminophen (TYLENOL) 325 MG tablet Take 650 mg by mouth every 6 (six) hours as needed for mild pain.     .  Amino Acids-Protein Hydrolys (FEEDING SUPPLEMENT, PRO-STAT 64,) LIQD Take 30 mLs by mouth daily.    Marland Kitchen ascorbic acid (VITAMIN C) 500 MG tablet Take 1 tablet (500 mg total) by mouth daily.    . bisacodyl (DULCOLAX) 10 MG suppository Place 1 suppository (10 mg total) rectally daily as needed for moderate constipation. 12 suppository 0  . calcium carbonate (OSCAL) 1500 (600 Ca) MG TABS tablet Take 1,500 mg by mouth 2 (two) times daily as needed (heartburn).     . Cholecalciferol (D3-1000 PO) Take 1,000 Units by mouth daily.     . digoxin (LANOXIN) 0.25 MG tablet Take 1 tablet (0.25 mg total) by mouth daily. 30 tablet 1  . docusate sodium (COLACE) 100 MG capsule Take 1 capsule (100 mg total) by mouth 2 (two) times daily. (Patient taking differently: Take 100 mg by mouth 2 (two) times daily as needed for mild constipation.) 10 capsule 0  . ferrous sulfate 325 (65 FE) MG EC tablet Take 325 mg by mouth daily with breakfast.    . hyoscyamine (LEVSIN) 0.125 MG tablet Take 1 tablet (0.125 mg total) by mouth every 4 (four) hours as needed for cramping. 30 tablet 0  . levalbuterol (XOPENEX HFA) 45 MCG/ACT inhaler Inhale 2 puffs into the lungs every 6 (six) hours as needed for wheezing. 1 Inhaler 0  . loperamide (IMODIUM) 2 MG capsule Take 1 capsule by mouth 4 (four) times daily as needed for diarrhea or loose stools.     . magic mouthwash SOLN Take 15 mLs by mouth 4 (four) times daily. 300 mL 0  . magnesium oxide (MAG-OX) 400 MG tablet Take 800 mg by mouth daily.     . metoprolol tartrate (LOPRESSOR) 25 MG tablet Take 1 tablet (25 mg total) by mouth daily as needed (for Heart rate >120). 30 tablet 0  . midodrine (PROAMATINE) 10 MG tablet Take 1 tablet (10 mg total) by mouth 3 (three) times daily as needed (  orthostatic. SBP<90). 30 tablet 0  . omeprazole (PRILOSEC) 20 MG capsule Take 1 capsule (20 mg total) by mouth 2 (two) times daily. 60 capsule 1  . oxyCODONE-acetaminophen (PERCOCET/ROXICET) 5-325 MG tablet  Take 1 tablet by mouth every 4 (four) hours as needed for moderate pain. 30 tablet 0  . polyethylene glycol (MIRALAX / GLYCOLAX) 17 g packet Take 17 g by mouth daily. 14 each 0  . PROPYLENE GLYCOL OP Place 1 drop into both eyes as needed (dry eyes).     Marland Kitchen apixaban (ELIQUIS) 2.5 MG TABS tablet 5 mg BID starting 01/07/2020 till 01/10/2020. Starting 01/11/2020 take 2.5 mg BID. (Patient not taking: Reported on 02/19/2020) 60 tablet 0  . Infant Care Products [facility]) OINT Apply 1 application topically daily as needed (protectant).       Allergies as of 02/18/2020  . (No Known Allergies)     Review of Systems:    This is positive for those things mentioned in the HPI, also positive for asthenia weakness loss of appetite. All other review of systems are negative.       Physical Exam:  Vital signs in last 24 hours: Temp:  [97.6 F (36.4 C)-98.1 F (36.7 C)] 97.6 F (36.4 C) (01/19 0346) Pulse Rate:  [93-111] 96 (01/19 1400) Resp:  [12-19] 12 (01/19 1400) BP: (80-132)/(63-87) 111/78 (01/19 1400) SpO2:  [99 %-100 %] 100 % (01/19 1400) Weight:  [83.4 kg] 83.4 kg (01/18 2143)    General:  Chronically ill pale man lying in the stretcher looking weak Eyes:  anicteric. Lungs: Clear to auscultation bilaterally. Heart:   S1S2, no rubs, murmurs, gallops. Abdomen:  soft, non-tender, no hepatosplenomegaly, hernia, or mass and BS+.  Neuro:  A&O x 3.  Psych:  Flat Affect.   Data Reviewed:   LAB RESULTS: Recent Labs    02/18/20 2245 02/18/20 2258 02/19/20 0508 02/19/20 1345  WBC 9.8  --  9.0 8.5  HGB 7.1* 7.8* 7.8* 7.7*  HCT 23.7* 23.0* 25.1* 24.7*  PLT 225  --  197 227   BMET Recent Labs    02/18/20 2245 02/18/20 2258 02/19/20 0508  NA 139 140 140  K 3.1* 3.1* 2.8*  CL 106 103 108  CO2 26  --  24  GLUCOSE 179* 170* 171*  BUN 21 19 21   CREATININE 0.84 0.80 0.90  CALCIUM 8.0*  --  7.8*   LFT Recent Labs    02/19/20 0508  PROT 5.4*  ALBUMIN 2.2*  AST 16  ALT 11   ALKPHOS 163*  BILITOT 0.9   PT/INR Recent Labs    02/18/20 2245  LABPROT 16.0*  INR 1.3*    STUDIES: Images are reviewed personally CT Angio Abd/Pel W and/or Wo Contrast  Result Date: 02/19/2020 CLINICAL DATA:  GI bleed EXAM: CTA ABDOMEN AND PELVIS WITHOUT AND WITH CONTRAST TECHNIQUE: Multidetector CT imaging of the abdomen and pelvis was performed using the standard protocol during bolus administration of intravenous contrast. Multiplanar reconstructed images and MIPs were obtained and reviewed to evaluate the vascular anatomy. CONTRAST:  138m OMNIPAQUE IOHEXOL 350 MG/ML SOLN COMPARISON:  January 03, 2020 FINDINGS: VASCULAR Aorta: Normal caliber aorta without aneurysm, dissection, vasculitis or significant stenosis. Celiac: Patent without evidence of aneurysm, dissection, vasculitis or significant stenosis. SMA: Patent without evidence of aneurysm, dissection, vasculitis or significant stenosis. There is a replaced right hepatic artery, a normal variant. Renals: Both renal arteries are patent without evidence of aneurysm, dissection, vasculitis, fibromuscular dysplasia or significant stenosis. There are 2  renal arteries on the right. IMA: Patent without evidence of aneurysm, dissection, vasculitis or significant stenosis. Inflow: Patent without evidence of aneurysm, dissection, vasculitis or significant stenosis. Proximal Outflow: Bilateral common femoral and visualized portions of the superficial and profunda femoral arteries are patent without evidence of aneurysm, dissection, vasculitis or significant stenosis. Veins: No obvious venous abnormality within the limitations of this arterial phase study. Review of the MIP images confirms the above findings. NON-VASCULAR Lower chest: The lung bases are clear. The heart size is normal. 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 Normal gallbladder.There is no biliary ductal dilation. Pancreas: Again noted is a cystic lesion at the head  of the pancreas, stable from prior study. Spleen: Unremarkable. Adrenals/Urinary Tract: --Adrenal glands: Unremarkable. --Right kidney/ureter: No hydronephrosis or radiopaque kidney stones. --Left kidney/ureter: No hydronephrosis or radiopaque kidney stones. --Urinary bladder: There is asymmetric bladder wall thickening involving the posterior bladder wall Stomach/Bowel: --Stomach/Duodenum: The stomach is distended. --Small bowel: Unremarkable. --Colon: A rectal stent is again noted. There is a large amount of stool in the colon. --Appendix: Not visualized. No right lower quadrant inflammation or free fluid. Lymphatic: --No retroperitoneal lymphadenopathy. --No mesenteric lymphadenopathy. --No pelvic or inguinal lymphadenopathy. Reproductive: Unremarkable Other: There is no free air. There is a trace amount of free fluid in the patient's pelvis. Musculoskeletal. No acute displaced fractures. IMPRESSION: 1. No acute vascular abnormality.  No evidence for GI bleeding. 2. Asymmetric bladder wall thickening involving the posterior bladder wall. This may be secondary to post treatment changes of the patient's rectal cancer. 3. Again noted are findings consistent with the patient's history of metastatic colorectal carcinoma. Aortic Atherosclerosis (ICD10-I70.0). Electronically Signed   By: Constance Holster M.D.   On: 02/19/2020 00:42       Thanks   LOS: 0 days   @Taijon Vink  Simonne Maffucci, MD, [facility] @  02/19/2020, 5:01 PM

## 2020-02-19 NOTE — H&P (View-Only) (Signed)
Consultation  Referring Provider:     [facility] Primary Care Physician:  Patient, No Pcp Per Primary Gastroenterologist:       Danis Reason for Consultation:     Lower GI bleed     Impression / Plan:   Lower GI bleed in a patient with a history of proximal rectal status post colonic stenting June 2021, currently with hepatic metastases despite chemotherapy and has been on Eliquis  Chronic anemia and acute blood loss anemia  Question if there is ulceration at the site of his sigmoid stent which is causing bleeding especially in the setting of Eliquis.  Agree with holding anticoagulation and I will perform a flexible sigmoidoscopy tomorrow.  The risks and benefits as well as alternatives of endoscopic procedure(s) have been discussed and reviewed. All questions answered. The patient agrees to proceed.  I have discontinued intravenous pantoprazole and placed him on pantoprazole orally for a lower bleed and since he is on omeprazole 20 mg twice daily at home we will continue PPI.   HPI:   Clarence Andrews. is a 70 y.o. male with a history of stage IV colon cancer who is DNR, presenting with hematochezia father met.  Other medical history includes paroxysmal atrial fibrillation, coronary artery bypass grafting type 2 diabetes mellitus, orthostatic hypotension on midodrine.  He was in the emergency room on January 12 with hemoglobin 6.5 and he was transfused a unit of packed red blood cells his Eliquis was held and he was released and returned with more bleeding so he was admitted.  After 1 packed unit of red cells his hemoglobin rose from 6.5 on 1 12-7.1 on January 18 and it was [phone].  He was transfused an additional unit of packed red cells after arriving here which resulted in the rise from 7.1-7.8  He is observed in the presence of nursing staff and he has brown soft stool mixed with blood.  Bright red blood.  He does not have any pain.  He had been admitted in early  December with aspiration pneumonia.  His Eliquis is on hold.  He was diagnosed in February 2021 by Dr. Fuller Plan, had the stent placed in June 2021 by Dr. Rush Landmark.  He has been cared for by Dr. Minta Balsam regarding the rectal cancer with metastases.  He has been hospitalized 3 times including this 1 since June of last year.  He lives in a SNF.  Past Medical History:  Diagnosis Date  . CAD (coronary artery disease) 2017   CABG  . CHF (congestive heart failure) ([place])   . COVID-19 04/29/2019  . Diabetes ([place])   . Paronychia of second toe of right foot   . Throat cancer [facility])     Past Surgical History:  Procedure Laterality Date  . APPENDECTOMY  1970  . COLONIC STENT PLACEMENT N/A 07/12/2019   Procedure: COLONIC STENT PLACEMENT;  Surgeon: Irving Copas., MD;  [facility];  Service: Gastroenterology;  Laterality: N/A;  . CORONARY ARTERY BYPASS GRAFT     5 vessel   . FLEXIBLE SIGMOIDOSCOPY N/A 07/12/2019   Procedure: FLEXIBLE SIGMOIDOSCOPY;  Surgeon: Rush Landmark Telford Nab., MD;  Location: [facility];  Service: Gastroenterology;  Laterality: N/A;  . IR IMAGING GUIDED PORT INSERTION  04/04/2019  . TOOTH EXTRACTION      Family History  Problem Relation Age of Onset  . Breast cancer Mother   . Colon polyps Father   . Heart disease Father   . Colon cancer Neg  Hx   . Esophageal cancer Neg Hx   . Rectal cancer Neg Hx   . Stomach cancer Neg Hx     Social History   Social History Narrative   Patient is single   Never smoker no drugs or alcohol at this time     Prior to Admission medications   Medication Sig Start Date End Date Taking? Authorizing Provider  acetaminophen (TYLENOL) 325 MG tablet Take 650 mg by mouth every 6 (six) hours as needed for mild pain.    Yes [provider]  Amino Acids-Protein Hydrolys (FEEDING SUPPLEMENT, PRO-STAT 64,) LIQD Take 30 mLs by mouth daily.   Yes [provider]  ascorbic acid (VITAMIN C) 500 MG tablet Take 1  tablet (500 mg total) by mouth daily. 05/05/19  Yes Eugenie Filler, MD  bisacodyl (DULCOLAX) 10 MG suppository Place 1 suppository (10 mg total) rectally daily as needed for moderate constipation. 01/07/20  Yes Lavina Hamman, MD  calcium carbonate (OSCAL) 1500 (600 Ca) MG TABS tablet Take 1,500 mg by mouth 2 (two) times daily as needed (heartburn).    Yes [provider]  Cholecalciferol (D3-1000 PO) Take 1,000 Units by mouth daily.    Yes [provider]  digoxin (LANOXIN) 0.25 MG tablet Take 1 tablet (0.25 mg total) by mouth daily. 07/25/19  Yes Samuella Cota, MD  docusate sodium (COLACE) 100 MG capsule Take 1 capsule (100 mg total) by mouth 2 (two) times daily. Patient taking differently: Take 100 mg by mouth 2 (two) times daily as needed for mild constipation. 01/07/20  Yes Lavina Hamman, MD  ferrous sulfate 325 (65 FE) MG EC tablet Take 325 mg by mouth daily with breakfast.   Yes [provider]  hyoscyamine (LEVSIN) 0.125 MG tablet Take 1 tablet (0.125 mg total) by mouth every 4 (four) hours as needed for cramping. 12/13/19  Yes Barb Merino, MD  levalbuterol [facility] HFA) 45 MCG/ACT inhaler Inhale 2 puffs into the lungs every 6 (six) hours as needed for wheezing. 05/04/19  Yes Eugenie Filler, MD  loperamide (IMODIUM) 2 MG capsule Take 1 capsule by mouth 4 (four) times daily as needed for diarrhea or loose stools.    Yes [provider]  magic mouthwash SOLN Take 15 mLs by mouth 4 (four) times daily. 01/07/20  Yes Lavina Hamman, MD  magnesium oxide (MAG-OX) 400 MG tablet Take 800 mg by mouth daily.    Yes [provider]  metoprolol tartrate (LOPRESSOR) 25 MG tablet Take 1 tablet (25 mg total) by mouth daily as needed (for Heart rate >120). 01/07/20  Yes Lavina Hamman, MD  midodrine (PROAMATINE) 10 MG tablet Take 1 tablet (10 mg total) by mouth 3 (three) times daily as needed (orthostatic. SBP<90). 01/07/20  Yes Lavina Hamman, MD   omeprazole (PRILOSEC) 20 MG capsule Take 1 capsule (20 mg total) by mouth 2 (two) times daily. 12/13/19 02/11/20 Yes Barb Merino, MD  oxyCODONE-acetaminophen (PERCOCET/ROXICET) 5-325 MG tablet Take 1 tablet by mouth every 4 (four) hours as needed for moderate pain. 01/07/20  Yes Lavina Hamman, MD  polyethylene glycol (MIRALAX / GLYCOLAX) 17 g packet Take 17 g by mouth daily. 01/07/20  Yes Lavina Hamman, MD  PROPYLENE GLYCOL OP Place 1 drop into both eyes as needed (dry eyes).    Yes [provider]  apixaban (ELIQUIS) 2.5 MG TABS tablet 5 mg BID starting 01/07/2020 till 01/10/2020. Starting 01/11/2020 take 2.5 mg BID. Patient  not taking: Reported on 02/19/2020 01/07/20   Lavina Hamman, MD  [employer] [facility]) OINT Apply 1 application topically daily as needed (protectant).  12/13/19   [provider]    Current Facility-Administered Medications  Medication Dose Route Frequency Provider Last Rate Last Admin  . 0.9 %  sodium chloride infusion (Manually program via Guardrails IV Fluids)   Intravenous Once Gareth Morgan, MD   Held at 02/18/20 2329  . bisacodyl (DULCOLAX) EC tablet 20 mg  20 mg Oral Once Gatha Mayer, MD      . digoxin Fonnie Birkenhead) tablet 0.25 mg  0.25 mg Oral Daily Darliss Cheney, MD   0.25 mg at 02/19/20 0945  . metoprolol tartrate (LOPRESSOR) tablet 25 mg  25 mg Oral Daily PRN Darliss Cheney, MD      . midodrine (PROAMATINE) tablet 10 mg  10 mg Oral TID PRN Darliss Cheney, MD      . morphine 2 MG/ML injection 2 mg  2 mg Intravenous Q2H PRN Elwyn Reach, MD      . oxyCODONE-acetaminophen (PERCOCET/ROXICET) 5-325 MG per tablet 1 tablet  1 tablet Oral Q4H PRN Darliss Cheney, MD      . pantoprazole (PROTONIX) EC tablet 20 mg  20 mg Oral BID AC Gatha Mayer, MD       Current Outpatient Medications  Medication Sig Dispense Refill  . acetaminophen (TYLENOL) 325 MG tablet Take 650 mg by mouth every 6 (six) hours as needed for mild pain.     .  Amino Acids-Protein Hydrolys (FEEDING SUPPLEMENT, PRO-STAT 64,) LIQD Take 30 mLs by mouth daily.    Marland Kitchen ascorbic acid (VITAMIN C) 500 MG tablet Take 1 tablet (500 mg total) by mouth daily.    . bisacodyl (DULCOLAX) 10 MG suppository Place 1 suppository (10 mg total) rectally daily as needed for moderate constipation. 12 suppository 0  . calcium carbonate (OSCAL) 1500 (600 Ca) MG TABS tablet Take 1,500 mg by mouth 2 (two) times daily as needed (heartburn).     . Cholecalciferol (D3-1000 PO) Take 1,000 Units by mouth daily.     . digoxin (LANOXIN) 0.25 MG tablet Take 1 tablet (0.25 mg total) by mouth daily. 30 tablet 1  . docusate sodium (COLACE) 100 MG capsule Take 1 capsule (100 mg total) by mouth 2 (two) times daily. (Patient taking differently: Take 100 mg by mouth 2 (two) times daily as needed for mild constipation.) 10 capsule 0  . ferrous sulfate 325 (65 FE) MG EC tablet Take 325 mg by mouth daily with breakfast.    . hyoscyamine (LEVSIN) 0.125 MG tablet Take 1 tablet (0.125 mg total) by mouth every 4 (four) hours as needed for cramping. 30 tablet 0  . levalbuterol (XOPENEX HFA) 45 MCG/ACT inhaler Inhale 2 puffs into the lungs every 6 (six) hours as needed for wheezing. 1 Inhaler 0  . loperamide (IMODIUM) 2 MG capsule Take 1 capsule by mouth 4 (four) times daily as needed for diarrhea or loose stools.     . magic mouthwash SOLN Take 15 mLs by mouth 4 (four) times daily. 300 mL 0  . magnesium oxide (MAG-OX) 400 MG tablet Take 800 mg by mouth daily.     . metoprolol tartrate (LOPRESSOR) 25 MG tablet Take 1 tablet (25 mg total) by mouth daily as needed (for Heart rate >120). 30 tablet 0  . midodrine (PROAMATINE) 10 MG tablet Take 1 tablet (10 mg total) by mouth 3 (three) times daily as needed (  orthostatic. SBP<90). 30 tablet 0  . omeprazole (PRILOSEC) 20 MG capsule Take 1 capsule (20 mg total) by mouth 2 (two) times daily. 60 capsule 1  . oxyCODONE-acetaminophen (PERCOCET/ROXICET) 5-325 MG tablet  Take 1 tablet by mouth every 4 (four) hours as needed for moderate pain. 30 tablet 0  . polyethylene glycol (MIRALAX / GLYCOLAX) 17 g packet Take 17 g by mouth daily. 14 each 0  . PROPYLENE GLYCOL OP Place 1 drop into both eyes as needed (dry eyes).     Marland Kitchen apixaban (ELIQUIS) 2.5 MG TABS tablet 5 mg BID starting 01/07/2020 till 01/10/2020. Starting 01/11/2020 take 2.5 mg BID. (Patient not taking: Reported on 02/19/2020) 60 tablet 0  . Infant [employer] [facility]) OINT Apply 1 application topically daily as needed (protectant).       Allergies as of 02/18/2020  . (No Known Allergies)     Review of Systems:    This is positive for those things mentioned in the HPI, also positive for asthenia weakness loss of appetite. All other review of systems are negative.       Physical Exam:  Vital signs in last 24 hours: Temp:  [97.6 F (36.4 C)-98.1 F (36.7 C)] 97.6 F (36.4 C) (01/19 0346) Pulse Rate:  [93-111] 96 (01/19 1400) Resp:  [12-19] 12 (01/19 1400) BP: (80-132)/(63-87) 111/78 (01/19 1400) SpO2:  [99 %-100 %] 100 % (01/19 1400) Weight:  [83.4 kg] 83.4 kg (01/18 2143)    General:  Chronically ill pale man lying in the stretcher looking weak Eyes:  anicteric. Lungs: Clear to auscultation bilaterally. Heart:   S1S2, no rubs, murmurs, gallops. Abdomen:  soft, non-tender, no hepatosplenomegaly, hernia, or mass and BS+.  Neuro:  A&O x 3.  Psych:  Flat Affect.   Data Reviewed:   LAB RESULTS: Recent Labs    02/18/20 2245 02/18/20 2258 02/19/20 0508 02/19/20 1345  WBC 9.8  --  9.0 8.5  HGB 7.1* 7.8* 7.8* 7.7*  HCT 23.7* 23.0* 25.1* 24.7*  PLT 225  --  197 227   BMET Recent Labs    02/18/20 2245 02/18/20 2258 02/19/20 0508  NA 139 140 140  K 3.1* 3.1* 2.8*  CL 106 103 108  CO2 26  --  24  GLUCOSE 179* 170* 171*  BUN 21 19 21   CREATININE 0.84 0.80 0.90  CALCIUM 8.0*  --  7.8*   LFT Recent Labs    02/19/20 0508  PROT 5.4*  ALBUMIN 2.2*  AST 16  ALT 11   ALKPHOS 163*  BILITOT 0.9   PT/INR Recent Labs    02/18/20 2245  LABPROT 16.0*  INR 1.3*    STUDIES: Images are reviewed personally CT Angio Abd/Pel W and/or Wo Contrast  Result Date: 02/19/2020 CLINICAL DATA:  GI bleed EXAM: CTA ABDOMEN AND PELVIS WITHOUT AND WITH CONTRAST TECHNIQUE: Multidetector CT imaging of the abdomen and pelvis was performed using the standard protocol during bolus administration of intravenous contrast. Multiplanar reconstructed images and MIPs were obtained and reviewed to evaluate the vascular anatomy. CONTRAST:  127m OMNIPAQUE IOHEXOL 350 MG/ML SOLN COMPARISON:  January 03, 2020 FINDINGS: VASCULAR Aorta: Normal caliber aorta without aneurysm, dissection, vasculitis or significant stenosis. Celiac: Patent without evidence of aneurysm, dissection, vasculitis or significant stenosis. SMA: Patent without evidence of aneurysm, dissection, vasculitis or significant stenosis. There is a replaced right hepatic artery, a normal variant. Renals: Both renal arteries are patent without evidence of aneurysm, dissection, vasculitis, fibromuscular dysplasia or significant stenosis. There are 2  renal arteries on the right. IMA: Patent without evidence of aneurysm, dissection, vasculitis or significant stenosis. Inflow: Patent without evidence of aneurysm, dissection, vasculitis or significant stenosis. Proximal Outflow: Bilateral common femoral and visualized portions of the superficial and profunda femoral arteries are patent without evidence of aneurysm, dissection, vasculitis or significant stenosis. Veins: No obvious venous abnormality within the limitations of this arterial phase study. Review of the MIP images confirms the above findings. NON-VASCULAR Lower chest: The lung bases are clear. The heart size is normal. 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 Normal gallbladder.There is no biliary ductal dilation. Pancreas: Again noted is a cystic lesion at the head  of the pancreas, stable from prior study. Spleen: Unremarkable. Adrenals/Urinary Tract: --Adrenal glands: Unremarkable. --Right kidney/ureter: No hydronephrosis or radiopaque kidney stones. --Left kidney/ureter: No hydronephrosis or radiopaque kidney stones. --Urinary bladder: There is asymmetric bladder wall thickening involving the posterior bladder wall Stomach/Bowel: --Stomach/Duodenum: The stomach is distended. --Small bowel: Unremarkable. --Colon: A rectal stent is again noted. There is a large amount of stool in the colon. --Appendix: Not visualized. No right lower quadrant inflammation or free fluid. Lymphatic: --No retroperitoneal lymphadenopathy. --No mesenteric lymphadenopathy. --No pelvic or inguinal lymphadenopathy. Reproductive: Unremarkable Other: There is no free air. There is a trace amount of free fluid in the patient's pelvis. Musculoskeletal. No acute displaced fractures. IMPRESSION: 1. No acute vascular abnormality.  No evidence for GI bleeding. 2. Asymmetric bladder wall thickening involving the posterior bladder wall. This may be secondary to post treatment changes of the patient's rectal cancer. 3. Again noted are findings consistent with the patient's history of metastatic colorectal carcinoma. Aortic Atherosclerosis (ICD10-I70.0). Electronically Signed   By: Constance Holster M.D.   On: 02/19/2020 00:42       Thanks   LOS: 0 days   @Carl  Simonne Maffucci, MD, [facility] @  02/19/2020, 5:01 PM

## 2020-02-19 NOTE — ED Notes (Signed)
Patient incontinent of stool, multiple loose red bowel movements last night, one so far this morning.  Patient has 3 stage 3 pressure sores on his buttocks.  Barrier cream applied after cleaning him of stool.

## 2020-02-20 ENCOUNTER — Encounter (HOSPITAL_COMMUNITY)
Admission: EM | Disposition: A | Discharge status: Skilled Nursing Facility | Payer: Self-pay | Source: Skilled Nursing Facility | Attending: Family Medicine

## 2020-02-20 ENCOUNTER — Encounter (HOSPITAL_COMMUNITY): Payer: Self-pay | Admitting: Internal Medicine

## 2020-02-20 ENCOUNTER — Other Ambulatory Visit: Payer: Self-pay

## 2020-02-20 DIAGNOSIS — K625 Hemorrhage of anus and rectum: Secondary | ICD-10-CM | POA: Diagnosis not present

## 2020-02-20 HISTORY — PX: BIOPSY: SHX5522

## 2020-02-20 HISTORY — PX: FLEXIBLE SIGMOIDOSCOPY: SHX5431

## 2020-02-20 LAB — CBC
HCT: 23.9 % — ABNORMAL LOW (ref 39.0–52.0)
HCT: 24.3 % — ABNORMAL LOW (ref 39.0–52.0)
Hemoglobin: 7.4 g/dL — ABNORMAL LOW (ref 13.0–17.0)
Hemoglobin: 7.4 g/dL — ABNORMAL LOW (ref 13.0–17.0)
MCH: 26.1 pg (ref 26.0–34.0)
MCH: 25.9 pg — ABNORMAL LOW (ref 26.0–34.0)
MCHC: 31 g/dL (ref 30.0–36.0)
MCHC: 30.5 g/dL (ref 30.0–36.0)
MCV: 84.5 fL (ref 80.0–100.0)
MCV: 85 fL (ref 80.0–100.0)
Platelets: 205 10*3/uL (ref 150–400)
Platelets: 239 10*3/uL (ref 150–400)
RBC: 2.83 MIL/uL — ABNORMAL LOW (ref 4.22–5.81)
RBC: 2.86 MIL/uL — ABNORMAL LOW (ref 4.22–5.81)
RDW: 16.6 % — ABNORMAL HIGH (ref 11.5–15.5)
RDW: 16.5 % — ABNORMAL HIGH (ref 11.5–15.5)
WBC: 6.8 10*3/uL (ref 4.0–10.5)
WBC: 7.1 10*3/uL (ref 4.0–10.5)
nRBC: 0 % (ref 0.0–0.2)
nRBC: 0 % (ref 0.0–0.2)

## 2020-02-20 LAB — BASIC METABOLIC PANEL
Anion gap: 7 (ref 5–15)
BUN: 26 mg/dL — ABNORMAL HIGH (ref 8–23)
CO2: 22 mmol/L (ref 22–32)
Calcium: 8.1 mg/dL — ABNORMAL LOW (ref 8.9–10.3)
Chloride: 112 mmol/L — ABNORMAL HIGH (ref 98–111)
Creatinine, Ser: 1.06 mg/dL (ref 0.61–1.24)
GFR, Estimated: >60 mL/min (ref >60)
Glucose, Bld: 129 mg/dL — ABNORMAL HIGH (ref 70–99)
Potassium: 3.6 mmol/L (ref 3.5–5.1)
Sodium: 141 mmol/L (ref 135–145)

## 2020-02-20 LAB — PROTIME-INR
INR: 1.3 — ABNORMAL HIGH (ref 0.8–1.2)
Prothrombin Time: 15.8 seconds — ABNORMAL HIGH (ref 11.4–15.2)

## 2020-02-20 LAB — GLUCOSE, CAPILLARY
Glucose-Capillary: 96 mg/dL (ref 70–99)
Glucose-Capillary: 167 mg/dL — ABNORMAL HIGH (ref 70–99)

## 2020-02-20 LAB — MRSA PCR SCREENING: MRSA by PCR: NEGATIVE

## 2020-02-20 SURGERY — SIGMOIDOSCOPY, FLEXIBLE
Anesthesia: Moderate Sedation

## 2020-02-20 MED ORDER — MIDAZOLAM HCL (PF) 10 MG/2ML IJ SOLN
2.0000 mg | Freq: Once | INTRAMUSCULAR | Status: AC
  Administered 2020-02-20: 2 mg via INTRAVENOUS

## 2020-02-20 MED ORDER — SODIUM CHLORIDE 0.9% FLUSH
10.0000 mL | INTRAVENOUS | Status: DC | PRN
  Administered 2020-02-26: 10 mL

## 2020-02-20 MED ORDER — INSULIN ASPART 100 UNIT/ML ~~LOC~~ SOLN
0.0000 [IU] | Freq: Three times a day (TID) | SUBCUTANEOUS | Status: DC
  Administered 2020-02-21 – 2020-02-22 (×4): 2 [IU] via SUBCUTANEOUS
  Administered 2020-02-23 (×2): 1 [IU] via SUBCUTANEOUS
  Administered 2020-02-24 – 2020-02-25 (×3): 2 [IU] via SUBCUTANEOUS
  Administered 2020-02-25 – 2020-02-26 (×3): 1 [IU] via SUBCUTANEOUS

## 2020-02-20 MED ORDER — SODIUM CHLORIDE 0.9% FLUSH
10.0000 mL | Freq: Two times a day (BID) | INTRAVENOUS | Status: DC
  Administered 2020-02-23: 10 mL

## 2020-02-20 MED ORDER — FENTANYL CITRATE (PF) 100 MCG/2ML IJ SOLN
INTRAMUSCULAR | Status: AC
  Filled 2020-02-20: qty 4

## 2020-02-20 MED ORDER — FENTANYL CITRATE (PF) 100 MCG/2ML IJ SOLN
25.0000 ug | Freq: Once | INTRAMUSCULAR | Status: AC
  Administered 2020-02-20: 25 ug via INTRAVENOUS

## 2020-02-20 MED ORDER — LACTATED RINGERS IV SOLN: INTRAVENOUS | Status: DC

## 2020-02-20 MED ORDER — CHLORHEXIDINE GLUCONATE CLOTH 2 % EX PADS
6.0000 | MEDICATED_PAD | Freq: Every day | CUTANEOUS | Status: DC
  Administered 2020-02-20 – 2020-02-26 (×7): 6 via TOPICAL

## 2020-02-20 MED ORDER — MIDAZOLAM HCL (PF) 5 MG/ML IJ SOLN
INTRAMUSCULAR | Status: AC
  Filled 2020-02-20: qty 2

## 2020-02-20 MED ORDER — INSULIN ASPART 100 UNIT/ML ~~LOC~~ SOLN: 0.0000 [IU] | Freq: Every day | SUBCUTANEOUS | Status: DC
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
## 2020-02-20 NOTE — Plan of Care (Signed)
Anxiety Levels will decrease

## 2020-02-20 NOTE — Progress Notes (Signed)
Patient assessed prior to administration of sedative medications, vital signs stable.  Patient placed in side lying position with nasal cannula in place but no oxygen administered during procedure as sats remained 98 to 100percent throughout.  Patient able to respond to voice during procedure and answered questions appropriately.  One low blood pressure recorded at end of the procedure when patient turned to his back and physician had given results of exam, the next pressure recorded was at baseline.Fentanyl 25 mcg and midazolam 2mg  given in procedure under the direction of Dr Carlean Purl after timeout performed, no other administrations required.

## 2020-02-20 NOTE — Interval H&P Note (Signed)
History and Physical Interval Note:  02/20/2020 4:11 PM  Clarence Andrews.  has presented today for surgery, with the diagnosis of Lower gastrointestinal bleeding.  The various methods of treatment have been discussed with the patient and family. After consideration of risks, benefits and other options for treatment, the patient has consented to  Procedure(s): FLEXIBLE SIGMOIDOSCOPY (N/A) as a surgical intervention.  The patient's history has been reviewed, patient examined, no change in status, stable for surgery.  I have reviewed the patient's chart and labs.  Questions were answered to the patient's satisfaction.     Silvano Rusk

## 2020-02-20 NOTE — Progress Notes (Signed)
PROGRESS NOTE    [name]  JP:[id number] DOB: 1950/06/23 DOA: 02/18/2020 PCP: Patient, No Pcp Per   Brief Narrative:  HPI: Clarence Andrews. is a 70 y.o. male with medical history significant of left-sided colon cancer status post multiple admissions last admission was a week ago with rectal bleed to the ER, history of aspiration pneumonia, coronary artery disease, coronary artery bypass grafting, colitis, diabetes, hypertension, previous COVID-19 infection among other things who was discharged to skilled facility back in December.  Patient came back here today secondary to multiple episodes of rectal bleed.  Patient reported being cleaned at least twice in the nursing facility and was here.  The bleeding is painless.  No associated hematemesis.  Denied any recent melena.  Patient's hemoglobin is notably down to 7.1.  CT angiogram of the abdomen is currently pending.  Patient has been transfused 1 unit of packed red blood cells in the ER and is being admitted to the hospital for further evaluation of GI bleed..  ED Course: Temperature 97.9 blood pressure 80/68 pulse 101 respiratory rate of 19 oxygen sats 100% room air.  Sodium 140 potassium 3.1 chloride 103 CO2 26.  Glucose is 170 BUN is 19 creatinine 1.04.  White count 9.8 hemoglobin 7.1 platelets 225.  CT angiogram of the abdomen and pelvis currently pending.  Patient will be admitted with rectal bleed.  GI will be consulted in the morning.   Assessment & Plan:   Principal Problem:   Rectal bleed Active Problems:   Cancer of left colon (HCC)   Type 2 diabetes mellitus without complication (HCC)   Acute on chronic systolic CHF (congestive heart failure) (HCC)   Atrial fibrillation with RVR (HCC)   CAD (coronary artery disease)   Hypokalemia   #1  Rectal bleeding: Probably related to patient's colon cancer.  Could also be diverticular disease.  Could be other causes.  Presented with hemoglobin of 7.1, received 1 unit of PRBC  transfusion at admission and hemoglobin posttransfusion was 7.8 which dropped to 7.4 today.  Patient endorses having further episodes of rectal bleeding.  GI consulted.  He is scheduled to have sigmoidoscopy today.  Appreciate GI help.   #2 left colon cancer: Defer to oncology as outpatient.  #3  Type 2 diabetes mellitus: Recent hemoglobin A1c on 12/221 was only 5.8.  He is not on any medications for this.  We will start him on sliding's insulin.  #4 hypokalemia: Resolved  #5 coronary artery disease: Appears stable at baseline.  Continue to monitor  #6 A. fib with mild RVR: Hold anticoagulation for rectal bleeding.  Rates controlled with current medications which includes metoprolol and digoxin.  #7 history of CHF.  Systolic CHF noted.  Continue monitor.  He is euvolemic.   DVT prophylaxis: SCDs Start: 02/19/20 0058   Code Status: DNR  Family Communication:  None present at bedside.  Plan of care discussed with patient in length and he verbalized understanding and agreed with it.  Status is: Inpatient  Remains inpatient appropriate because:Ongoing diagnostic testing needed not appropriate for outpatient work up   Dispo: The patient is from: SNF              Anticipated d/c is to: SNF              Anticipated d/c date is: 1 day              Patient currently is not medically stable to d/c.  Estimated body mass index is 23.61 kg/m as calculated from the following:   Height as of this encounter: 6\' 2"  (1.88 m).   Weight as of this encounter: 83.4 kg.  Pressure Injury 07/25/19 Sacrum Stage 1 -  Intact skin with non-blanchable redness of a localized area usually over a bony prominence. (Active)  07/25/19 1800  Location: Sacrum  Location Orientation:   Staging: Stage 1 -  Intact skin with non-blanchable redness of a localized area usually over a bony prominence.  Wound Description (Comments):   Present on Admission: Yes     Pressure Injury Ankle Right;Lateral  Stage 2 -  Partial thickness loss of dermis presenting as a shallow open injury with a red, pink wound bed without slough. (Active)     Location: Ankle  Location Orientation: Right;Lateral  Staging: Stage 2 -  Partial thickness loss of dermis presenting as a shallow open injury with a red, pink wound bed without slough.  Wound Description (Comments):   Present on Admission: Yes     Pressure Injury 01/03/20 Sacrum Left Stage 1 -  Intact skin with non-blanchable redness of a localized area usually over a bony prominence. (Active)  01/03/20 1718  Location: Sacrum  Location Orientation: Left  Staging: Stage 1 -  Intact skin with non-blanchable redness of a localized area usually over a bony prominence.  Wound Description (Comments):   Present on Admission: Yes     Nutritional status:               Consultants:   GI  Procedures:   Plan  Antimicrobials:  Anti-infectives (From admission, onward)   None         Subjective: Seen and examined.  No complaints currently however he has had couple of further movements of rectal bleeding he believes.   Objective: Vitals:   02/19/20 1900 02/19/20 2031 02/20/20 0019 02/20/20 0637  BP: 126/80 114/77 131/78 125/85  Pulse: 90 98 93 88  Resp: 17 18 15 15   Temp:  97.8 F (36.6 C) 97.7 F (36.5 C) 97.6 F (36.4 C)  TempSrc:  Oral Oral Oral  SpO2: 100%  100% 100%  Weight:      Height:        Intake/Output Summary (Last 24 hours) at 02/20/2020 1128 Last data filed at 02/19/2020 1812 Gross per 24 hour  Intake 209.65 ml  Output --  Net 209.65 ml   Filed Weights   02/18/20 2143  Weight: 83.4 kg    Examination:  General exam: Appears calm and comfortable  Respiratory system: Clear to auscultation. Respiratory effort normal. Cardiovascular system: S1 & S2 heard, RRR. No JVD, murmurs, rubs, gallops or clicks. No pedal edema. Gastrointestinal system: Abdomen is nondistended, soft and nontender. No organomegaly or  masses felt. Normal bowel sounds heard. Central nervous system: Alert and oriented. No focal neurological deficits. Extremities: Symmetric 5 x 5 power. Skin: No rashes, lesions or ulcers Psychiatry: Judgement and insight appear normal. Mood & affect appropriate.    Data Reviewed: I have personally reviewed following labs and imaging studies  CBC: Recent Labs  Lab 02/18/20 2245 02/18/20 2258 02/19/20 0508 02/19/20 1345 02/20/20 0106  WBC 9.8  --  9.0 8.5 6.8  NEUTROABS 6.9  --   --   --   --   HGB 7.1* 7.8* 7.8* 7.7* 7.4*  HCT 23.7* 23.0* 25.1* 24.7* 23.9*  MCV 85.6  --  83.4 84.0 84.5  PLT 225  --  197 227 205  Basic Metabolic Panel: Recent Labs  Lab 02/18/20 2245 02/18/20 2258 02/19/20 0508 02/20/20 0314  NA 139 140 140 141  K 3.1* 3.1* 2.8* 3.6  CL 106 103 108 112*  CO2 26  --  24 22  GLUCOSE 179* 170* 171* 129*  BUN 21 19 21  26*  CREATININE 0.84 0.80 0.90 1.06  CALCIUM 8.0*  --  7.8* 8.1*  MG  --   --  1.3*  --    GFR: Estimated Creatinine Clearance: 74.3 mL/min (by C-G formula based on SCr of 1.06 mg/dL). Liver Function Tests: Recent Labs  Lab 02/18/20 2245 02/19/20 0508  AST 16 16  ALT 12 11  ALKPHOS 172* 163*  BILITOT 0.8 0.9  PROT 5.6* 5.4*  ALBUMIN 2.3* 2.2*   Recent Labs  Lab 02/18/20 2245  LIPASE 19   No results for input(s): AMMONIA in the last 168 hours. Coagulation Profile: Recent Labs  Lab 02/18/20 2245 02/20/20 0106  INR 1.3* 1.3*   Cardiac Enzymes: No results for input(s): CKTOTAL, CKMB, CKMBINDEX, TROPONINI in the last 168 hours. BNP (last 3 results) No results for input(s): PROBNP in the last 8760 hours. HbA1C: No results for input(s): HGBA1C in the last 72 hours. CBG: No results for input(s): GLUCAP in the last 168 hours. Lipid Profile: No results for input(s): CHOL, HDL, LDLCALC, TRIG, CHOLHDL, LDLDIRECT in the last 72 hours. Thyroid Function Tests: No results for input(s): TSH, T4TOTAL, FREET4, T3FREE, THYROIDAB in  the last 72 hours. Anemia Panel: No results for input(s): VITAMINB12, FOLATE, FERRITIN, TIBC, IRON, RETICCTPCT in the last 72 hours. Sepsis Labs: No results for input(s): PROCALCITON, LATICACIDVEN in the last 168 hours.  Recent Results (from the past 240 hour(s))  SARS CORONAVIRUS 2 (TAT 6-24 HRS) Nasopharyngeal Nasopharyngeal Swab     Status: None   Collection Time: 02/18/20 10:45 PM   Specimen: Nasopharyngeal Swab  Result Value Ref Range Status   SARS Coronavirus 2 NEGATIVE NEGATIVE Final    Comment: (NOTE) SARS-CoV-2 target nucleic acids are NOT DETECTED.  The SARS-CoV-2 RNA is generally detectable in upper and lower respiratory specimens during the acute phase of infection. Negative results do not preclude SARS-CoV-2 infection, do not rule out co-infections with other pathogens, and should not be used as the sole basis for treatment or other patient management decisions. Negative results must be combined with clinical observations, patient history, and epidemiological information. The expected result is Negative.  Fact Sheet for Patients: SugarRoll.be  Fact Sheet for Healthcare Providers: https://www.woods-mathews.com/  This test is not yet approved or cleared by the [place] FDA and  has been authorized for detection and/or diagnosis of SARS-CoV-2 by FDA under an Emergency Use Authorization (EUA). This EUA will remain  in effect (meaning this test can be used) for the duration of the COVID-19 declaration under Se ction 564(b)(1) of the Act, 21 U.S.C. section 360bbb-3(b)(1), unless the authorization is terminated or revoked sooner.  Performed at [facility], [place] [address] [place], [place] [address]   MRSA PCR Screening     Status: None   Collection Time: 02/20/20  6:27 AM   Specimen: Nasal Mucosa; Nasopharyngeal  Result Value Ref Range Status   MRSA by PCR NEGATIVE NEGATIVE Final    Comment:        The GeneXpert  MRSA Assay (FDA approved for NASAL specimens only), is one component of a comprehensive MRSA colonization surveillance program. It is not intended to diagnose MRSA infection nor to guide or monitor treatment  for MRSA infections. Performed at [facility], [place] [address], [place], [place] [address]       Radiology Studies: CT Angio Abd/Pel W and/or Wo Contrast  Result Date: 02/19/2020 CLINICAL DATA:  GI bleed EXAM: CTA ABDOMEN AND PELVIS WITHOUT AND WITH CONTRAST TECHNIQUE: Multidetector CT imaging of the abdomen and pelvis was performed using the standard protocol during bolus administration of intravenous contrast. Multiplanar reconstructed images and MIPs were obtained and reviewed to evaluate the vascular anatomy. CONTRAST:  149mL OMNIPAQUE IOHEXOL 350 MG/ML SOLN COMPARISON:  January 03, 2020 FINDINGS: VASCULAR Aorta: Normal caliber aorta without aneurysm, dissection, vasculitis or significant stenosis. Celiac: Patent without evidence of aneurysm, dissection, vasculitis or significant stenosis. SMA: Patent without evidence of aneurysm, dissection, vasculitis or significant stenosis. There is a replaced right hepatic artery, a normal variant. Renals: Both renal arteries are patent without evidence of aneurysm, dissection, vasculitis, fibromuscular dysplasia or significant stenosis. There are 2 renal arteries on the right. IMA: Patent without evidence of aneurysm, dissection, vasculitis or significant stenosis. Inflow: Patent without evidence of aneurysm, dissection, vasculitis or significant stenosis. Proximal Outflow: Bilateral common femoral and visualized portions of the superficial and profunda femoral arteries are patent without evidence of aneurysm, dissection, vasculitis or significant stenosis. Veins: No obvious venous abnormality within the limitations of this arterial phase study. Review of the MIP images confirms the above findings. NON-VASCULAR Lower chest: The lung  bases are clear. The heart size is normal. 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 Normal gallbladder.There is no biliary ductal dilation. Pancreas: Again noted is a cystic lesion at the head of the pancreas, stable from prior study. Spleen: Unremarkable. Adrenals/Urinary Tract: --Adrenal glands: Unremarkable. --Right kidney/ureter: No hydronephrosis or radiopaque kidney stones. --Left kidney/ureter: No hydronephrosis or radiopaque kidney stones. --Urinary bladder: There is asymmetric bladder wall thickening involving the posterior bladder wall Stomach/Bowel: --Stomach/Duodenum: The stomach is distended. --Small bowel: Unremarkable. --Colon: A rectal stent is again noted. There is a large amount of stool in the colon. --Appendix: Not visualized. No right lower quadrant inflammation or free fluid. Lymphatic: --No retroperitoneal lymphadenopathy. --No mesenteric lymphadenopathy. --No pelvic or inguinal lymphadenopathy. Reproductive: Unremarkable Other: There is no free air. There is a trace amount of free fluid in the patient's pelvis. Musculoskeletal. No acute displaced fractures. IMPRESSION: 1. No acute vascular abnormality.  No evidence for GI bleeding. 2. Asymmetric bladder wall thickening involving the posterior bladder wall. This may be secondary to post treatment changes of the patient's rectal cancer. 3. Again noted are findings consistent with the patient's history of metastatic colorectal carcinoma. Aortic Atherosclerosis (ICD10-I70.0). Electronically Signed   By: Constance Holster M.D.   On: 02/19/2020 00:42    Scheduled Meds: . Chlorhexidine Gluconate Cloth  6 each Topical Daily  . digoxin  0.25 mg Oral Daily  . pantoprazole  20 mg Oral BID AC  . sodium chloride flush  10-40 mL Intracatheter Q12H   Continuous Infusions: . lactated ringers 75 mL/hr at 02/20/20 0947     LOS: 1 day   Time spent: 29 minutes   Darliss Cheney, MD [facility]  02/20/2020, 11:28 AM    To contact the attending provider between 7A-7P or the covering provider during after hours 7P-7A, please log into the web site [email or web address].

## 2020-02-20 NOTE — Op Note (Signed)
[facility] Patient Name: Clarence Andrews Procedure Date: 02/20/2020 MRN: [id number] Attending MD: Gatha Mayer , MD Date of Birth: [birth year] CSN: [id number] Age: 70 Admit Type: Inpatient Procedure:                Flexible Sigmoidoscopy Indications:              Cancer in the sigmoid colon, Rectal pain Providers:                Gatha Mayer, MD, Particia Nearing, RN, Benetta Spar, Technician Referring MD:              Medicines:                Midazolam 2 mg IV, Fentanyl 25 micrograms IV Complications:            No immediate complications. Estimated Blood Loss:     Estimated blood loss was minimal. Procedure:                Pre-Anesthesia Assessment:                           - Prior to the procedure, a History and Physical                            was performed, and patient medications and                            allergies were reviewed. The patient's tolerance of                            previous anesthesia was also reviewed. The risks                            and benefits of the procedure and the sedation                            options and risks were discussed with the patient.                            All questions were answered, and informed consent                            was obtained. Prior Anticoagulants: The patient                            last took Eliquis (apixaban) 7 days prior to the                            procedure. ASA Grade Assessment: III - A patient                            with severe systemic disease. After reviewing the  risks and benefits, the patient was deemed in                            satisfactory condition to undergo the procedure.                           After obtaining informed consent, the scope was                            passed under direct vision. The [id number]-[id number] ([id number])                            was introduced through the anus and advanced to  the                            the sigmoid colon. The flexible sigmoidoscopy was                            accomplished without difficulty. The patient                            tolerated the procedure well. The quality of the                            bowel preparation was poor. Scope In: Scope Out: Findings:      The digital rectal exam findings include decreased sphincter tone.      An infiltrative, polypoid and ulcerated medium-sized mass was found in       the recto-sigmoid colon. The mass was circumferential. This was biopsied       with a cold forceps for histology. Verification of patient       identification for the specimen was done. Estimated blood loss was       minimal.      A Wallstent was found in the recto-sigmoid colon. Impression:               - Preparation of the colon was poor.                           - Decreased sphincter tone found on digital rectal                            exam.                           - Likely malignant tumor in the recto-sigmoid                            colon. Biopsied. Looks like regrowth/persistence of                            known cancer                           - Stent in the colon. Moderate Sedation:      Not Applicable - Patient had  care per Anesthesia. Recommendation:           - Return patient to hospital ward for ongoing care.                            feed.                           - suspect he will continue to bleed and will be                            worse with eliquis so would leave off. This is only                            going to worsen, I am afraid. Not sure we will have                            any good options otherthan support. Will ask                            colleagues. One possibility might be placing                            another stent inside of existing one.                           - his anemia is in part from the bleeding but not                            only that, I  believe Procedure Code(s):        --- Professional ---                           [phone], Sigmoidoscopy, flexible; with biopsy, single                            or multiple Diagnosis Code(s):        --- Professional ---                           K62.89, Other specified diseases of anus and rectum                           D49.0, Neoplasm of unspecified behavior of                            digestive system                           C18.7, Malignant neoplasm of sigmoid colon CPT copyright 2019 American Medical Association. All rights reserved. The codes documented in this report are preliminary and upon coder review may  be revised to meet current compliance requirements. Gatha Mayer, MD 02/20/2020 4:52:33 PM This report has been signed electronically. Number of Addenda: 0

## 2020-02-21 ENCOUNTER — Inpatient Hospital Stay: Payer: Medicare Other | Admitting: Oncology

## 2020-02-21 DIAGNOSIS — C189 Malignant neoplasm of colon, unspecified: Secondary | ICD-10-CM | POA: Diagnosis not present

## 2020-02-21 DIAGNOSIS — E876 Hypokalemia: Secondary | ICD-10-CM

## 2020-02-21 DIAGNOSIS — E119 Type 2 diabetes mellitus without complications: Secondary | ICD-10-CM

## 2020-02-21 DIAGNOSIS — K922 Gastrointestinal hemorrhage, unspecified: Secondary | ICD-10-CM | POA: Diagnosis not present

## 2020-02-21 DIAGNOSIS — K625 Hemorrhage of anus and rectum: Secondary | ICD-10-CM | POA: Diagnosis not present

## 2020-02-21 DIAGNOSIS — I4891 Unspecified atrial fibrillation: Secondary | ICD-10-CM

## 2020-02-21 DIAGNOSIS — I2510 Atherosclerotic heart disease of native coronary artery without angina pectoris: Secondary | ICD-10-CM

## 2020-02-21 DIAGNOSIS — C787 Secondary malignant neoplasm of liver and intrahepatic bile duct: Secondary | ICD-10-CM

## 2020-02-21 LAB — BPAM RBC
Blood Product Expiration Date: 202201312359
Blood Product Expiration Date: 202202172359
ISSUE DATE / TIME: 202201190037
Unit Type and Rh: 7300
Unit Type and Rh: 7300

## 2020-02-21 LAB — HEMOGLOBIN AND HEMATOCRIT, BLOOD
HCT: 24.7 % — ABNORMAL LOW (ref 39.0–52.0)
Hemoglobin: 7.7 g/dL — ABNORMAL LOW (ref 13.0–17.0)

## 2020-02-21 LAB — BASIC METABOLIC PANEL
Anion gap: 7 (ref 5–15)
BUN: 22 mg/dL (ref 8–23)
CO2: 23 mmol/L (ref 22–32)
Calcium: 8 mg/dL — ABNORMAL LOW (ref 8.9–10.3)
Chloride: 110 mmol/L (ref 98–111)
Creatinine, Ser: 0.79 mg/dL (ref 0.61–1.24)
GFR, Estimated: >60 mL/min (ref >60)
Glucose, Bld: 191 mg/dL — ABNORMAL HIGH (ref 70–99)
Potassium: 3.4 mmol/L — ABNORMAL LOW (ref 3.5–5.1)
Sodium: 140 mmol/L (ref 135–145)

## 2020-02-21 LAB — PREPARE RBC (CROSSMATCH)

## 2020-02-21 LAB — CBC
HCT: 22 % — ABNORMAL LOW (ref 39.0–52.0)
Hemoglobin: 6.7 g/dL — CL (ref 13.0–17.0)
MCH: 25.8 pg — ABNORMAL LOW (ref 26.0–34.0)
MCHC: 30.5 g/dL (ref 30.0–36.0)
MCV: 84.6 fL (ref 80.0–100.0)
Platelets: 190 10*3/uL (ref 150–400)
RBC: 2.6 MIL/uL — ABNORMAL LOW (ref 4.22–5.81)
RDW: 16.6 % — ABNORMAL HIGH (ref 11.5–15.5)
WBC: 6.6 10*3/uL (ref 4.0–10.5)
nRBC: 0 % (ref 0.0–0.2)

## 2020-02-21 LAB — TYPE AND SCREEN
ABO/RH(D): B POS
Antibody Screen: NEGATIVE
Unit division: 0
Unit division: 0

## 2020-02-21 LAB — GLUCOSE, CAPILLARY
Glucose-Capillary: 181 mg/dL — ABNORMAL HIGH (ref 70–99)
Glucose-Capillary: 183 mg/dL — ABNORMAL HIGH (ref 70–99)
Glucose-Capillary: 106 mg/dL — ABNORMAL HIGH (ref 70–99)
Glucose-Capillary: 107 mg/dL — ABNORMAL HIGH (ref 70–99)

## 2020-02-21 MED ORDER — POTASSIUM CHLORIDE CRYS ER 20 MEQ PO TBCR
20.0000 meq | EXTENDED_RELEASE_TABLET | Freq: Once | ORAL | Status: AC
  Administered 2020-02-21: 20 meq via ORAL
  Filled 2020-02-21: qty 1

## 2020-02-21 MED ORDER — SODIUM CHLORIDE 0.9% IV SOLUTION: Freq: Once | INTRAVENOUS | Status: DC

## 2020-02-21 MED ORDER — SODIUM CHLORIDE 0.9% IV SOLUTION: Freq: Once | INTRAVENOUS | Status: AC

## 2020-02-21 NOTE — Care Management Important Message (Signed)
Important Message  Patient Details IM Letter placed in Patient's room. Name: Clarence Andrews. MRN: [id number] Date of Birth: July 21, 1950   Medicare Important Message Given:  Yes     Kerin Salen 02/21/2020, 11:04 AM

## 2020-02-21 NOTE — Progress Notes (Signed)
CRITICAL VALUE ALERT  Critical Value:  HGB 6.7  Date & Time Notied:  02/21/20 7:05  Provider Notified: 02/21/20 7:40  Orders Received/Actions taken:  Jerene Pitch

## 2020-02-21 NOTE — Progress Notes (Signed)
[facility]  PROGRESS NOTE  Clarence Andrews. JP:[id number] DOB: Feb 19, 1950 DOA: 02/18/2020 PCP: Patient, No Pcp Per   Brief HPI:   70 year old male with history of left-sided colon cancer, came to ED with complaints of rectal bleeding.  Patient history of aspiration pneumonia, CAD, CABG, colitis, diabetes mellitus type 2, hypertension, previous COVID-19 infection came back to hospital with multiple episodes of rectal bleeding.  Hemoglobin is down to 7.1.  CT angiogram of the abdomen showed no evidence of GI bleeding.   Subjective   Patient seen and examined, status post flexible sigmoidoscopy, ulcerated, polypoid mass found in the rectosigmoid likely recurrence of rectal cancer.  Which was biopsied.   Assessment/Plan:     1. Rectal bleeding-likely from recurrence of rectal cancer, flexible sigmoidoscopy done shows ulcerated mass in the rectosigmoid.  Mass was biopsied.  Will await further GI recommendations. 2. Left colon cancer-follows oncology as outpatient 3. Anemia-secondary rectal bleeding, hemoglobin is 6.7.  Will transfuse 1 unit PRBC.  Follow CBC in a.m. 4. Hypomagnesemia-magnesium 1.3, will replace magnesium.  Follow magnesium level in a.m. 5. Hypokalemia-potassium is 3.4, will replace potassium and follow BMP in am. 6. Atrial fibrillation RVR--anticoagulation on hold due to rectal bleeding.  Heart rate is controlled, continue metoprolol, digoxin. 7. History of systolic CHF-patient is currently euvolemic.   COVID-19 Labs  No results for input(s): DDIMER, FERRITIN, LDH, CRP in the last 72 hours.  Lab Results  Component Value Date   SARSCOV2NAA NEGATIVE 02/18/2020   SARSCOV2NAA NEGATIVE 01/07/2020   [place] NEGATIVE 01/03/2020   [place] NEGATIVE 12/11/2019     Scheduled medications:   . sodium chloride   Intravenous Once  . Chlorhexidine Gluconate Cloth  6 each Topical Daily  . digoxin  0.25 mg Oral Daily  . insulin aspart  0-5 Units Subcutaneous QHS   . insulin aspart  0-9 Units Subcutaneous TID WC  . pantoprazole  20 mg Oral BID AC  . potassium chloride  20 mEq Oral Once  . sodium chloride flush  10-40 mL Intracatheter Q12H         CBG: Recent Labs  Lab 02/20/20 1226 02/20/20 2239 02/21/20 0740 02/21/20 1207  GLUCAP 96 167* 181* 183*    SpO2: 100 %    CBC: Recent Labs  Lab 02/18/20 2245 02/18/20 2258 02/19/20 0508 02/19/20 1345 02/20/20 0106 02/20/20 1830 02/21/20 0506  WBC 9.8  --  9.0 8.5 6.8 7.1 6.6  NEUTROABS 6.9  --   --   --   --   --   --   HGB 7.1*   < > 7.8* 7.7* 7.4* 7.4* 6.7*  HCT 23.7*   < > 25.1* 24.7* 23.9* 24.3* 22.0*  MCV 85.6  --  83.4 84.0 84.5 85.0 84.6  PLT 225  --  197 227 205 239 190   < > = values in this interval not displayed.    Basic Metabolic Panel: Recent Labs  Lab 02/18/20 2245 02/18/20 2258 02/19/20 0508 02/20/20 0314 02/21/20 0506  NA 139 140 140 141 140  K 3.1* 3.1* 2.8* 3.6 3.4*  CL 106 103 108 112* 110  CO2 26  --  24 22 23   GLUCOSE 179* 170* 171* 129* 191*  BUN 21 19 21  26* 22  CREATININE 0.84 0.80 0.90 1.06 0.79  CALCIUM 8.0*  --  7.8* 8.1* 8.0*  MG  --   --  1.3*  --   --      Liver Function Tests: Recent Labs  Lab  02/18/20 2245 02/19/20 0508  AST 16 16  ALT 12 11  ALKPHOS 172* 163*  BILITOT 0.8 0.9  PROT 5.6* 5.4*  ALBUMIN 2.3* 2.2*     Antibiotics: Anti-infectives (From admission, onward)   None       DVT prophylaxis: SCDs  Code Status: DNR  Family Communication: No family at bedside   Consultants:  Gastroenterology  Procedures:      Objective   Vitals:   02/21/20 0629 02/21/20 0853 02/21/20 1211 02/21/20 1248  BP: 122/73  127/80 128/76  Pulse: 81 92 86 83  Resp: 18  20 14   Temp: 98 F (36.7 C)  97.8 F (36.6 C) (!) 97.3 F (36.3 C)  TempSrc:   Axillary Oral  SpO2: 100%  100% 100%  Weight:      Height:        Intake/Output Summary (Last 24 hours) at 02/21/2020 1316 Last data filed at 02/21/2020 1248 Gross  per 24 hour  Intake 715 ml  Output --  Net 715 ml    01/19 1901 - 01/21 0700 In: 150 [I.V.:150] Out: -   Filed Weights   02/18/20 2143  Weight: 83.4 kg    Physical Examination:    General-appears in no acute distress  Heart-S1-S2, regular, no murmur auscultated  Lungs-clear to auscultation bilaterally, no wheezing or crackles auscultated  Abdomen-soft, nontender, no organomegaly  Extremities-no edema in the lower extremities  Neuro-alert, oriented x3, no focal deficit noted   Status is: Inpatient  Dispo: The patient is from: Home              Anticipated d/c is to: Home              Anticipated d/c date is: 02/22/2020              Patient currently not medically stable for discharge  Barrier to discharge-awaiting final GI recommendations  Pressure Injury 07/25/19 Sacrum Stage 1 -  Intact skin with non-blanchable redness of a localized area usually over a bony prominence. (Active)  07/25/19 1800  Location: Sacrum  Location Orientation:   Staging: Stage 1 -  Intact skin with non-blanchable redness of a localized area usually over a bony prominence.  Wound Description (Comments):   Present on Admission: Yes     Pressure Injury Ankle Right;Lateral Stage 2 -  Partial thickness loss of dermis presenting as a shallow open injury with a red, pink wound bed without slough. (Active)     Location: Ankle  Location Orientation: Right;Lateral  Staging: Stage 2 -  Partial thickness loss of dermis presenting as a shallow open injury with a red, pink wound bed without slough.  Wound Description (Comments):   Present on Admission: Yes     Pressure Injury 01/03/20 Sacrum Left Stage 1 -  Intact skin with non-blanchable redness of a localized area usually over a bony prominence. (Active)  01/03/20 1718  Location: Sacrum  Location Orientation: Left  Staging: Stage 1 -  Intact skin with non-blanchable redness of a localized area usually over a bony prominence.  Wound Description  (Comments):   Present on Admission: Yes           Data Reviewed:   Recent Results (from the past 240 hour(s))  SARS CORONAVIRUS 2 (TAT 6-24 HRS) Nasopharyngeal Nasopharyngeal Swab     Status: None   Collection Time: 02/18/20 10:45 PM   Specimen: Nasopharyngeal Swab  Result Value Ref Range Status   SARS Coronavirus 2 NEGATIVE NEGATIVE Final  Comment: (NOTE) SARS-CoV-2 target nucleic acids are NOT DETECTED.  The SARS-CoV-2 RNA is generally detectable in upper and lower respiratory specimens during the acute phase of infection. Negative results do not preclude SARS-CoV-2 infection, do not rule out co-infections with other pathogens, and should not be used as the sole basis for treatment or other patient management decisions. Negative results must be combined with clinical observations, patient history, and epidemiological information. The expected result is Negative.  Fact Sheet for Patients: SugarRoll.be  Fact Sheet for Healthcare Providers: https://www.woods-mathews.com/  This test is not yet approved or cleared by the [place] FDA and  has been authorized for detection and/or diagnosis of SARS-CoV-2 by FDA under an Emergency Use Authorization (EUA). This EUA will remain  in effect (meaning this test can be used) for the duration of the COVID-19 declaration under Se ction 564(b)(1) of the Act, 21 U.S.C. section 360bbb-3(b)(1), unless the authorization is terminated or revoked sooner.  Performed at [facility], [place] [address] [place], [place] [address]   MRSA PCR Screening     Status: None   Collection Time: 02/20/20  6:27 AM   Specimen: Nasal Mucosa; Nasopharyngeal  Result Value Ref Range Status   MRSA by PCR NEGATIVE NEGATIVE Final    Comment:        The GeneXpert MRSA Assay (FDA approved for NASAL specimens only), is one component of a comprehensive MRSA colonization surveillance program. It is  not intended to diagnose MRSA infection nor to guide or monitor treatment for MRSA infections. Performed at [facility], [place] [address] [place], [place] [address]     Recent Labs  Lab 02/18/20 2245  LIPASE 19   BNP (last 3 results) Recent Labs    04/28/19 0134  BNP 261.4*     [place]   [facility] If 7PM-7AM, please contact night-coverage at [email or web address], Office  [phone]   02/21/2020, 1:16 PM  LOS: 2 days

## 2020-02-21 NOTE — NC FL2 (Signed)
[place] LEVEL OF CARE SCREENING TOOL     IDENTIFICATION  Patient Name: Clarence Andrews. Birthdate: [birth year] Sex: male Admission Date (Current Location): 02/18/2020  [place] and [place] Number:  Kathleen Argue [id number] [id number] Facility and Address:  [facility],  [place], [place]      Provider Number: [id number]  Attending Physician Name and Address:  Oswald Hillock, MD  Relative Name and Phone Number:  Joedy, E[place] [phone]  [phone]    Current Level of Care: Hospital Recommended Level of Care: [place] Prior Approval Number:    Date Approved/Denied:   PASRR Number: [id number]  Discharge Plan: SNF    Current Diagnoses: Patient Active Problem List   Diagnosis Date Noted  . Hypokalemia 02/19/2020  . Rectal bleed 02/19/2020  . PAF (paroxysmal atrial fibrillation) ([place]) 01/03/2020  . History of throat cancer 01/03/2020  . Pressure ulcer of sacral region, stage 1 09/21/2019  . Chronic diarrhea 09/10/2019  . Hypotension 09/10/2019  . Port-A-Cath in place 08/13/2019  . Physical deconditioning 07/25/2019  . Chronic systolic heart failure ([place])   . Atrial fibrillation with RVR ([place]) 07/09/2019  . CAD (coronary artery disease) 07/09/2019  . Acute on chronic systolic CHF (congestive heart failure) ([place])   . 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 Atrial flutter ([place])   . Type 2 diabetes mellitus without complication ([place]) 46/27/0350  . Goals of care, counseling/discussion 04/02/2019  . Cancer of left colon ([place]) 04/02/2019  . 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[place]) 04/02/2019    Orientation RESPIRATION BLADDER Height & Weight     Self,Time,Situation,Place  Normal Incontinent Weight: 183 lb 13.8 oz (83.4 kg) Height:  6\' 2"  (188 cm)  BEHAVIORAL SYMPTOMS/MOOD NEUROLOGICAL BOWEL NUTRITION STATUS      Continent Diet  AMBULATORY STATUS COMMUNICATION OF NEEDS Skin   Limited Assist Verbally Surgical  wounds                       Personal Care Assistance Level of Assistance  Bathing,Dressing,Feeding Bathing Assistance: Limited assistance Feeding assistance: Limited assistance Dressing Assistance: Limited assistance     Functional Limitations Info  Sight,Hearing,Speech Sight Info: Adequate Hearing Info: Adequate Speech Info: Adequate    SPECIAL CARE FACTORS FREQUENCY                       Contractures Contractures Info: Not present    Additional Factors Info  Code Status,Allergies,Insulin Sliding Scale Code Status Info: DNR Allergies Info: NKA   Insulin Sliding Scale Info: insulin aspart (novoLOG) injection 0-9 Units 3x a day with meals.       Current Medications (02/21/2020):  This is the current hospital active medication list Current Facility-Administered Medications  Medication Dose Route Frequency Provider Last Rate Last Admin  . 0.9 %  sodium chloride infusion (Manually program via Guardrails IV Fluids)   Intravenous Once Lang Snow, FNP      . Chlorhexidine Gluconate Cloth 2 % PADS 6 each  6 each Topical Daily Gatha Mayer, MD   6 each at 02/21/20 1023  . digoxin (LANOXIN) tablet 0.25 mg  0.25 mg Oral Daily Gatha Mayer, MD   0.25 mg at 02/21/20 0854  . insulin aspart (novoLOG) injection 0-5 Units  0-5 Units Subcutaneous QHS Gatha Mayer, MD      . insulin aspart (novoLOG) injection 0-9 Units  0-9 Units Subcutaneous TID WC Gatha Mayer, MD  2 Units at 02/21/20 1312  . lactated ringers infusion   Intravenous Continuous Gatha Mayer, MD 75 mL/hr at 02/20/20 0947 New Bag at 02/20/20 0947  . metoprolol tartrate (LOPRESSOR) tablet 25 mg  25 mg Oral Daily PRN Gatha Mayer, MD      . midodrine (PROAMATINE) tablet 10 mg  10 mg Oral TID PRN Gatha Mayer, MD      . morphine 2 MG/ML injection 2 mg  2 mg Intravenous Q2H PRN Gatha Mayer, MD      . oxyCODONE-acetaminophen (PERCOCET/ROXICET) 5-325 MG per tablet 1 tablet  1 tablet Oral Q4H  PRN Gatha Mayer, MD   1 tablet at 02/21/20 0017  . pantoprazole (PROTONIX) EC tablet 20 mg  20 mg Oral BID AC Gatha Mayer, MD   20 mg at 02/21/20 0854  . sodium chloride flush (NS) 0.9 % injection 10-40 mL  10-40 mL Intracatheter Q12H Gatha Mayer, MD      . sodium chloride flush (NS) 0.9 % injection 10-40 mL  10-40 mL Intracatheter PRN Gatha Mayer, MD         Discharge Medications: Please see discharge summary for a list of discharge medications.  Relevant Imaging Results:  Relevant Lab Results:   Additional Information SSN [id number]  Ross Ludwig, LCSW

## 2020-02-21 NOTE — Progress Notes (Addendum)
Progress Note  Assessment / Plan:   Assessment: 1.  Lower GI bleed: Hemoglobin 7.4--> 6.7 overnight, no further bowel movements per patient, status post sigmoidoscopy 02/20/2020 with likely recurrent rectal cancer, biopsies pending 2.  Acute on chronic blood loss anemia: With above  Plan: 1.  Awaiting pathology from procedure 02/20/2020 2.  Continue regular diet and other supportive measures 3.  Continue to monitor hemoglobin with transfusion as needed less than 7 4.  Please await any further recommendations from Dr. Carlean Purl later today  Thank you for your kind consultation.   LOS: 2 days   Clarence Andrews  02/21/2020, 10:35 AM  I have seen this patient as well and explained to him that these are really no good options to stop his bleeding. Its not so much that he has had a recurrence of his tumor as it probably never went away but it is growing through the stent and it has been bleeding some. That said I think his anemia is likely multifactorial and he has a severe chronic disease anemia as well which is contributing. Dr. [name] of oncology is not treating him anymore and had recommended hospice but the patient declined in December. He would like to move to [place] to be near his son's but there is some family strife about that. He was to get a palliative care consult in the outpatient world at the SNF but that has not happened yet. I have discussed with Dr. Darrick Meigs and we will get a palliative care consult here to help set goals of care. I really do think hospice is appropriate for this man. I think the only thing that would stop his bleeding would be surgery and that is not an appropriate option.  Clarence Mayer, MD, [facility] 02/21/2020 3:27 PM     Subjective  Chief Complaint: Lower GI bleed  Status post flex sig 02/20/2020 with decreased sphincter tone, likely malignant tumor in the rectosigmoid colon which looks like regrowth/persistence of known cancer,  stent and colon  This morning patient tells me he has not had a bowel movement at all since time of procedure yesterday, but is eating "like a horse this morning".  He does ask some questions in regards to most likely cancer which was found yesterday during procedure and what the neck steps will be.   Objective   Vital signs in last 24 hours: Temp:  [97.6 F (36.4 C)-98.1 F (36.7 C)] 98 F (36.7 C) (01/21 0629) Pulse Rate:  [81-93] 92 (01/21 0853) Resp:  [13-20] 18 (01/21 0629) BP: (89-130)/(58-85) 122/73 (01/21 0629) SpO2:  [97 %-100 %] 100 % (01/21 0629) Last BM Date: 02/20/20 General:    white male in NAD Heart:  Regular rate and rhythm; no murmurs Lungs: Respirations even and unlabored, lungs CTA bilaterally Abdomen:  Soft, nontender and nondistended. Normal bowel sounds. Extremities:  Without edema. Neurologic:  Alert and oriented,  grossly normal neurologically. Psych:  Cooperative. Normal mood and affect.  Intake/Output from previous day: 01/20 0701 - 01/21 0700 In: 150 [I.V.:150] Out: -   Lab Results: Recent Labs    02/20/20 0106 02/20/20 1830 02/21/20 0506  WBC 6.8 7.1 6.6  HGB 7.4* 7.4* 6.7*  HCT 23.9* 24.3* 22.0*  PLT 205 239 190   BMET Recent Labs    02/19/20 0508 02/20/20 0314 02/21/20 0506  NA 140 141 140  K 2.8* 3.6 3.4*  CL 108 112* 110  CO2 24 22 23   GLUCOSE 171* 129*  191*  BUN 21 26* 22  CREATININE 0.90 1.06 0.79  CALCIUM 7.8* 8.1* 8.0*   LFT Recent Labs    02/19/20 0508  PROT 5.4*  ALBUMIN 2.2*  AST 16  ALT 11  ALKPHOS 163*  BILITOT 0.9   PT/INR Recent Labs    02/18/20 2245 02/20/20 0106  LABPROT 16.0* 15.8*  INR 1.3* 1.3*

## 2020-02-21 NOTE — Progress Notes (Signed)
CRITICAL VALUE ALERT  Critical Value:  Hgb 6.7  Date & Time Notied:  02/21/20  0520  Provider Notified: Dirk Dress [facility]- M. Sharlet Salina  Orders Received/Actions taken: New orders provided for an H&H, crossmatch, and RBC transfusion.

## 2020-02-22 ENCOUNTER — Encounter (HOSPITAL_COMMUNITY): Payer: Self-pay | Admitting: Internal Medicine

## 2020-02-22 DIAGNOSIS — K625 Hemorrhage of anus and rectum: Secondary | ICD-10-CM | POA: Diagnosis not present

## 2020-02-22 DIAGNOSIS — C787 Secondary malignant neoplasm of liver and intrahepatic bile duct: Secondary | ICD-10-CM | POA: Diagnosis not present

## 2020-02-22 DIAGNOSIS — E876 Hypokalemia: Secondary | ICD-10-CM | POA: Diagnosis not present

## 2020-02-22 DIAGNOSIS — C189 Malignant neoplasm of colon, unspecified: Secondary | ICD-10-CM | POA: Diagnosis not present

## 2020-02-22 DIAGNOSIS — I4891 Unspecified atrial fibrillation: Secondary | ICD-10-CM | POA: Diagnosis not present

## 2020-02-22 LAB — TYPE AND SCREEN
ABO/RH(D): B POS
Antibody Screen: NEGATIVE
Unit division: 0

## 2020-02-22 LAB — COMPREHENSIVE METABOLIC PANEL
ALT: 10 U/L (ref 0–44)
AST: 15 U/L (ref 15–41)
Albumin: 2.1 g/dL — ABNORMAL LOW (ref 3.5–5.0)
Alkaline Phosphatase: 134 U/L — ABNORMAL HIGH (ref 38–126)
Anion gap: 8 (ref 5–15)
BUN: 15 mg/dL (ref 8–23)
CO2: 23 mmol/L (ref 22–32)
Calcium: 7.5 mg/dL — ABNORMAL LOW (ref 8.9–10.3)
Chloride: 108 mmol/L (ref 98–111)
Creatinine, Ser: 0.62 mg/dL (ref 0.61–1.24)
GFR, Estimated: >60 mL/min (ref >60)
Glucose, Bld: 110 mg/dL — ABNORMAL HIGH (ref 70–99)
Potassium: 3.2 mmol/L — ABNORMAL LOW (ref 3.5–5.1)
Sodium: 139 mmol/L (ref 135–145)
Total Bilirubin: 0.6 mg/dL (ref 0.3–1.2)
Total Protein: 4.9 g/dL — ABNORMAL LOW (ref 6.5–8.1)

## 2020-02-22 LAB — CBC
HCT: 24.9 % — ABNORMAL LOW (ref 39.0–52.0)
Hemoglobin: 7.7 g/dL — ABNORMAL LOW (ref 13.0–17.0)
MCH: 26.2 pg (ref 26.0–34.0)
MCHC: 30.9 g/dL (ref 30.0–36.0)
MCV: 84.7 fL (ref 80.0–100.0)
Platelets: 194 10*3/uL (ref 150–400)
RBC: 2.94 MIL/uL — ABNORMAL LOW (ref 4.22–5.81)
RDW: 16.2 % — ABNORMAL HIGH (ref 11.5–15.5)
WBC: 7.1 10*3/uL (ref 4.0–10.5)
nRBC: 0 % (ref 0.0–0.2)

## 2020-02-22 LAB — GLUCOSE, CAPILLARY
Glucose-Capillary: 102 mg/dL — ABNORMAL HIGH (ref 70–99)
Glucose-Capillary: 152 mg/dL — ABNORMAL HIGH (ref 70–99)
Glucose-Capillary: 196 mg/dL — ABNORMAL HIGH (ref 70–99)
Glucose-Capillary: 129 mg/dL — ABNORMAL HIGH (ref 70–99)

## 2020-02-22 LAB — BPAM RBC
Blood Product Expiration Date: 202201312359
ISSUE DATE / TIME: 202201211240
Unit Type and Rh: 7300

## 2020-02-22 MED ORDER — MAGNESIUM SULFATE 4 GM/100ML IV SOLN
4.0000 g | Freq: Once | INTRAVENOUS | Status: AC
  Administered 2020-02-22: 4 g via INTRAVENOUS
  Filled 2020-02-22: qty 100

## 2020-02-22 MED ORDER — POTASSIUM CHLORIDE 10 MEQ/100ML IV SOLN
10.0000 meq | INTRAVENOUS | Status: AC
  Administered 2020-02-22 (×3): 10 meq via INTRAVENOUS
  Filled 2020-02-22 (×3): qty 100

## 2020-02-22 MED ORDER — ZINC OXIDE 40 % EX OINT
TOPICAL_OINTMENT | Freq: Three times a day (TID) | CUTANEOUS | Status: DC
  Administered 2020-02-24 – 2020-02-25 (×2): 1 via TOPICAL
  Filled 2020-02-22 (×2): qty 57

## 2020-02-22 NOTE — Progress Notes (Addendum)
[facility]  PROGRESS NOTE  Clarence Andrews. [id number] DOB: Aug 09, 1950 DOA: 02/18/2020 PCP: Patient, No Pcp Per   Brief HPI:   70 year old male with history of left-sided colon cancer, came to ED with complaints of rectal bleeding.  Patient history of aspiration pneumonia, CAD, CABG, colitis, diabetes mellitus type 2, hypertension, previous COVID-19 infection came back to hospital with multiple episodes of rectal bleeding.  Hemoglobin is down to 7.1.  CT angiogram of the abdomen showed no evidence of GI bleeding.   Subjective   Patient seen and examined, denies any complaints.   Assessment/Plan:     1. Rectal bleeding-likely from recurrence of rectal cancer, flexible sigmoidoscopy done shows ulcerated mass in the rectosigmoid.  Mass was biopsied.  As per GI patient is not a good surgical candidate and chemotherapy is has been stopped in the past due to progression of disease.  There are not many options left for the tumor and palliative care/hospice is the best option for her at this time.  Perative care consultation has been obtained.  We will await their recommendations. 2. Left colon cancer-follows oncology as outpatient.  Chemotherapy stopped due to progression of disease. 3. Anemia-secondary rectal bleeding, hemoglobin is 7.7 after he received 1 unit PRBC.  Follow CBC in a.m.   4. Hypomagnesemia-magnesium is 1.3.  Will give 4 g of magnesium sulfate and check magnesium level in a.m. 5. Hypokalemia-potassium is 3.2 will replace potassium and follow BMP in am. 6. Atrial fibrillation RVR--anticoagulation on hold due to rectal bleeding.  Heart rate is controlled, continue metoprolol, digoxin. 7. History of systolic CHF-patient is currently euvolemic.   COVID-19 Labs  No results for input(s): DDIMER, FERRITIN, LDH, CRP in the last 72 hours.  Lab Results  Component Value Date   SARSCOV2NAA NEGATIVE 02/18/2020   SARSCOV2NAA NEGATIVE 01/07/2020   [place] NEGATIVE  01/03/2020   [place] NEGATIVE 12/11/2019     Scheduled medications:   . sodium chloride   Intravenous Once  . Chlorhexidine Gluconate Cloth  6 each Topical Daily  . digoxin  0.25 mg Oral Daily  . insulin aspart  0-5 Units Subcutaneous QHS  . insulin aspart  0-9 Units Subcutaneous TID WC  . pantoprazole  20 mg Oral BID AC  . sodium chloride flush  10-40 mL Intracatheter Q12H         CBG: Recent Labs  Lab 02/21/20 1207 02/21/20 1647 02/21/20 2058 02/22/20 0748 02/22/20 1156  GLUCAP 183* 106* 107* 102* 152*    SpO2: 100 %    CBC: Recent Labs  Lab 02/18/20 2245 02/18/20 2258 02/19/20 1345 02/20/20 0106 02/20/20 1830 02/21/20 0506 02/21/20 2000 02/22/20 0500  WBC 9.8   < > 8.5 6.8 7.1 6.6  --  7.1  NEUTROABS 6.9  --   --   --   --   --   --   --   HGB 7.1*   < > 7.7* 7.4* 7.4* 6.7* 7.7* 7.7*  HCT 23.7*   < > 24.7* 23.9* 24.3* 22.0* 24.7* 24.9*  MCV 85.6   < > 84.0 84.5 85.0 84.6  --  84.7  PLT 225   < > 227 205 239 190  --  194   < > = values in this interval not displayed.    Basic Metabolic Panel: Recent Labs  Lab 02/18/20 2245 02/18/20 2258 02/19/20 0508 02/20/20 0314 02/21/20 0506 02/22/20 0500  NA 139 140 140 141 140 139  K 3.1* 3.1* 2.8* 3.6 3.4* 3.2*  CL  106 103 108 112* 110 108  CO2 26  --  24 22 23 23   GLUCOSE 179* 170* 171* 129* 191* 110*  BUN 21 19 21  26* 22 15  CREATININE 0.84 0.80 0.90 1.06 0.79 0.62  CALCIUM 8.0*  --  7.8* 8.1* 8.0* 7.5*  MG  --   --  1.3*  --   --   --      Liver Function Tests: Recent Labs  Lab 02/18/20 2245 02/19/20 0508 02/22/20 0500  AST 16 16 15   ALT 12 11 10   ALKPHOS 172* 163* 134*  BILITOT 0.8 0.9 0.6  PROT 5.6* 5.4* 4.9*  ALBUMIN 2.3* 2.2* 2.1*     Antibiotics: Anti-infectives (From admission, onward)   None       DVT prophylaxis: SCDs  Code Status: DNR  Family Communication: No family at bedside   Consultants:  Gastroenterology  Procedures:      Objective    Vitals:   02/21/20 1317 02/21/20 1532 02/21/20 2100 02/22/20 0551  BP: 139/89 137/90 (!) 141/66 136/75  Pulse: 81 75 79 88  Resp: 20 14  14   Temp:  (!) 97.4 F (36.3 C)    TempSrc:      SpO2: 100% 100% 100% 100%  Weight:      Height:        Intake/Output Summary (Last 24 hours) at 02/22/2020 1201 Last data filed at 02/22/2020 0830 Gross per 24 hour  Intake 1165 ml  Output 1275 ml  Net -110 ml    01/20 1901 - 01/22 0700 In: 925 [P.O.:360; I.V.:250] Out: 1275 [Urine:1275]  Filed Weights   02/18/20 2143  Weight: 83.4 kg    Physical Examination:   General-appears in no acute distress Heart-S1-S2, regular, no murmur auscultated Lungs-clear to auscultation bilaterally, no wheezing or crackles auscultated Abdomen-soft, nontender, no organomegaly Extremities-no edema in the lower extremities Neuro-alert, oriented x3, no focal deficit noted  Status is: Inpatient  Dispo: The patient is from: Home              Anticipated d/c is to: Home              Anticipated d/c date is: 02/23/2020              Patient currently not medically stable for discharge  Barrier to discharge-awaiting final GI recommendations  Pressure Injury 07/25/19 Sacrum Stage 1 -  Intact skin with non-blanchable redness of a localized area usually over a bony prominence. (Active)  07/25/19 1800  Location: Sacrum  Location Orientation:   Staging: Stage 1 -  Intact skin with non-blanchable redness of a localized area usually over a bony prominence.  Wound Description (Comments):   Present on Admission: Yes     Pressure Injury Ankle Right;Lateral Stage 2 -  Partial thickness loss of dermis presenting as a shallow open injury with a red, pink wound bed without slough. (Active)     Location: Ankle  Location Orientation: Right;Lateral  Staging: Stage 2 -  Partial thickness loss of dermis presenting as a shallow open injury with a red, pink wound bed without slough.  Wound Description (Comments):    Present on Admission: Yes     Pressure Injury 01/03/20 Sacrum Left Stage 1 -  Intact skin with non-blanchable redness of a localized area usually over a bony prominence. (Active)  01/03/20 1718  Location: Sacrum  Location Orientation: Left  Staging: Stage 1 -  Intact skin with non-blanchable redness of a localized area usually over  a bony prominence.  Wound Description (Comments):   Present on Admission: Yes           Data Reviewed:   Recent Results (from the past 240 hour(s))  SARS CORONAVIRUS 2 (TAT 6-24 HRS) Nasopharyngeal Nasopharyngeal Swab     Status: None   Collection Time: 02/18/20 10:45 PM   Specimen: Nasopharyngeal Swab  Result Value Ref Range Status   SARS Coronavirus 2 NEGATIVE NEGATIVE Final    Comment: (NOTE) SARS-CoV-2 target nucleic acids are NOT DETECTED.  The SARS-CoV-2 RNA is generally detectable in upper and lower respiratory specimens during the acute phase of infection. Negative results do not preclude SARS-CoV-2 infection, do not rule out co-infections with other pathogens, and should not be used as the sole basis for treatment or other patient management decisions. Negative results must be combined with clinical observations, patient history, and epidemiological information. The expected result is Negative.  Fact Sheet for Patients: SugarRoll.be  Fact Sheet for Healthcare Providers: https://www.woods-mathews.com/  This test is not yet approved or cleared by the [place] FDA and  has been authorized for detection and/or diagnosis of SARS-CoV-2 by FDA under an Emergency Use Authorization (EUA). This EUA will remain  in effect (meaning this test can be used) for the duration of the COVID-19 declaration under Se ction 564(b)(1) of the Act, 21 U.S.C. section 360bbb-3(b)(1), unless the authorization is terminated or revoked sooner.  Performed at [facility], [place] [address] [place],  [place] [address]   MRSA PCR Screening     Status: None   Collection Time: 02/20/20  6:27 AM   Specimen: Nasal Mucosa; Nasopharyngeal  Result Value Ref Range Status   MRSA by PCR NEGATIVE NEGATIVE Final    Comment:        The GeneXpert MRSA Assay (FDA approved for NASAL specimens only), is one component of a comprehensive MRSA colonization surveillance program. It is not intended to diagnose MRSA infection nor to guide or monitor treatment for MRSA infections. Performed at [facility], [place] [address], [place], [place] [address]     Recent Labs  Lab 02/18/20 2245  LIPASE 19   BNP (last 3 results) Recent Labs    04/28/19 0134  BNP 261.4*     [place]   [facility] If 7PM-7AM, please contact night-coverage at [email or web address], Office  [phone]   02/22/2020, 12:01 PM  LOS: 3 days

## 2020-02-22 NOTE — Progress Notes (Signed)
   Patient Name: Clarence Andrews. 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  Not a candidate for further chemotherapy this was discontinued in December and hospice was recommended by oncology, Dr. Malachy Mood  Chronic anemia as well as acute blood loss anemia   I spoke to the patient for 25 minutes today.  I reviewed how we were out of treatment options for him and that he was not a surgical candidate and chemotherapy had been stopped because he had progressed on treatment.  He indicated that he has never been a quitter and does not want to quit.  I suggested he consider this more like excepting the reality.  He talked quite a bit about his parents and their dad's and his estranged family divorced wife.  I explained that a palliative care consult is forthcoming and that they could explain hospice better than but again I did recommend hospice.  It sounds like he is slowly coming to grips with things but is really still in somewhat of a denial stage.  I did tell him we could possibly ablate the tumor to reduce bleeding but I really do not think that is a realistic option for him.  He indicated that he really does not want to be bedbound bleeding and getting transfusions.  I explained that unfortunately the interventions we have  available are either unlikely to help or dangerous such as surgery.  He indicated he would like to have surgery and that killed him so be it.  Await palliative medicine consult, again I think the patient should go to hospice though mentally does not seem ready for this.  GI is going to sign off at this point I think supportive care is the best we can do Please call us back if there are other treatment issues that arise that you think we could help with  Gatha Mayer, MD, [facility] 02/22/2020 10:20 AM

## 2020-02-23 DIAGNOSIS — Z515 Encounter for palliative care: Secondary | ICD-10-CM

## 2020-02-23 DIAGNOSIS — Z7189 Other specified counseling: Secondary | ICD-10-CM | POA: Diagnosis not present

## 2020-02-23 DIAGNOSIS — R531 Weakness: Secondary | ICD-10-CM | POA: Diagnosis not present

## 2020-02-23 DIAGNOSIS — K625 Hemorrhage of anus and rectum: Secondary | ICD-10-CM | POA: Diagnosis not present

## 2020-02-23 DIAGNOSIS — C787 Secondary malignant neoplasm of liver and intrahepatic bile duct: Secondary | ICD-10-CM | POA: Diagnosis not present

## 2020-02-23 DIAGNOSIS — C189 Malignant neoplasm of colon, unspecified: Secondary | ICD-10-CM | POA: Diagnosis not present

## 2020-02-23 LAB — GLUCOSE, CAPILLARY
Glucose-Capillary: 101 mg/dL — ABNORMAL HIGH (ref 70–99)
Glucose-Capillary: 143 mg/dL — ABNORMAL HIGH (ref 70–99)
Glucose-Capillary: 135 mg/dL — ABNORMAL HIGH (ref 70–99)
Glucose-Capillary: 151 mg/dL — ABNORMAL HIGH (ref 70–99)

## 2020-02-23 LAB — BASIC METABOLIC PANEL
Anion gap: 2 — ABNORMAL LOW (ref 5–15)
BUN: 13 mg/dL (ref 8–23)
CO2: 27 mmol/L (ref 22–32)
Calcium: 7.6 mg/dL — ABNORMAL LOW (ref 8.9–10.3)
Chloride: 105 mmol/L (ref 98–111)
Creatinine, Ser: 0.82 mg/dL (ref 0.61–1.24)
GFR, Estimated: >60 mL/min (ref >60)
Glucose, Bld: 107 mg/dL — ABNORMAL HIGH (ref 70–99)
Potassium: 3.4 mmol/L — ABNORMAL LOW (ref 3.5–5.1)
Sodium: 134 mmol/L — ABNORMAL LOW (ref 135–145)

## 2020-02-23 LAB — MAGNESIUM: Magnesium: 1.6 mg/dL — ABNORMAL LOW (ref 1.7–2.4)

## 2020-02-23 MED ORDER — POTASSIUM CHLORIDE CRYS ER 20 MEQ PO TBCR
40.0000 meq | EXTENDED_RELEASE_TABLET | Freq: Once | ORAL | Status: AC
  Administered 2020-02-23: 40 meq via ORAL
  Filled 2020-02-23: qty 2

## 2020-02-23 MED ORDER — MAGNESIUM SULFATE 2 GM/50ML IV SOLN
2.0000 g | Freq: Once | INTRAVENOUS | Status: AC
  Administered 2020-02-23: 2 g via INTRAVENOUS
  Filled 2020-02-23: qty 50

## 2020-02-23 NOTE — Progress Notes (Signed)
[facility]  PROGRESS NOTE  [name] HDQ:[id number] DOB: [birth year] DOA: 02/18/2020 PCP: Patient, No Pcp Per   Brief HPI:   70 year old male with history of left-sided colon cancer, came to ED with complaints of rectal bleeding.  Patient history of aspiration pneumonia, CAD, CABG, colitis, diabetes mellitus type 2, hypertension, previous COVID-19 infection came back to hospital with multiple episodes of rectal bleeding.  Hemoglobin is down to 7.1.  CT angiogram of the abdomen showed no evidence of GI bleeding.   Subjective   Patient seen and examined, denies any complaints.   Assessment/Plan:     1. Rectal bleeding-likely from recurrence of rectal cancer, flexible sigmoidoscopy done shows ulcerated mass in the rectosigmoid.  Mass was biopsied.  As per GI patient is not a good surgical candidate and chemotherapy is has been stopped in the past due to progression of disease.  There are not many options left for the tumor and palliative care/hospice is the best option for her at this time.  Appreciate palliative care input.  Patient wants to go to skilled nursing facility with palliative care follow-up. 2. Left colon cancer-follows oncology as outpatient.  Chemotherapy stopped due to progression of disease. 3. Anemia-secondary rectal bleeding, hemoglobin is 7.7 after he received 1 unit PRBC.  Follow CBC in a.m.   4. Hypomagnesemia-magnesium is 1.3 this morning after he received 4 g of magnesium sulfate yesterday.  Will give additional magnesium sulfate 2 g IV x1 and follow serum magnesium level in a.m.  1.3.  Will give 4 g of magnesium sulfate and check magnesium level in a.m. 5. Hypokalemia-potassium is 3.4, will replace potassium and follow BMP in am. 6. Atrial fibrillation RVR--anticoagulation on hold due to rectal bleeding.  Heart rate is controlled, continue metoprolol, digoxin. 7. History of systolic CHF-patient is currently euvolemic.   COVID-19 Labs  No results for  input(s): DDIMER, FERRITIN, LDH, CRP in the last 72 hours.  Lab Results  Component Value Date   SARSCOV2NAA NEGATIVE 02/18/2020   SARSCOV2NAA NEGATIVE 01/07/2020   [place] NEGATIVE 01/03/2020   [place] NEGATIVE 12/11/2019     Scheduled medications:   . Chlorhexidine Gluconate Cloth  6 each Topical Daily  . digoxin  0.25 mg Oral Daily  . insulin aspart  0-5 Units Subcutaneous QHS  . insulin aspart  0-9 Units Subcutaneous TID WC  . liver oil-zinc oxide   Topical Q8H  . pantoprazole  20 mg Oral BID AC  . sodium chloride flush  10-40 mL Intracatheter Q12H         CBG: Recent Labs  Lab 02/22/20 1156 02/22/20 1634 02/22/20 2151 02/23/20 0715 02/23/20 1134  GLUCAP 152* 196* 129* 101* 143*    SpO2: 100 %    CBC: Recent Labs  Lab 02/18/20 2245 02/18/20 2258 02/19/20 1345 02/20/20 0106 02/20/20 1830 02/21/20 0506 02/21/20 2000 02/22/20 0500  WBC 9.8   < > 8.5 6.8 7.1 6.6  --  7.1  NEUTROABS 6.9  --   --   --   --   --   --   --   HGB 7.1*   < > 7.7* 7.4* 7.4* 6.7* 7.7* 7.7*  HCT 23.7*   < > 24.7* 23.9* 24.3* 22.0* 24.7* 24.9*  MCV 85.6   < > 84.0 84.5 85.0 84.6  --  84.7  PLT 225   < > 227 205 239 190  --  194   < > = values in this interval not displayed.    Basic  Metabolic Panel: Recent Labs  Lab 02/19/20 0508 02/20/20 0314 02/21/20 0506 02/22/20 0500 02/23/20 0335  NA 140 141 140 139 134*  K 2.8* 3.6 3.4* 3.2* 3.4*  CL 108 112* 110 108 105  CO2 24 22 23 23 27   GLUCOSE 171* 129* 191* 110* 107*  BUN 21 26* 22 15 13   CREATININE 0.90 1.06 0.79 0.62 0.82  CALCIUM 7.8* 8.1* 8.0* 7.5* 7.6*  MG 1.3*  --   --   --  1.6*     Liver Function Tests: Recent Labs  Lab 02/18/20 2245 02/19/20 0508 02/22/20 0500  AST 16 16 15   ALT 12 11 10   ALKPHOS 172* 163* 134*  BILITOT 0.8 0.9 0.6  PROT 5.6* 5.4* 4.9*  ALBUMIN 2.3* 2.2* 2.1*     Antibiotics: Anti-infectives (From admission, onward)   None       DVT prophylaxis: SCDs  Code  Status: DNR  Family Communication: No family at bedside   Consultants:  Gastroenterology  Procedures:      Objective   Vitals:   02/22/20 2153 02/23/20 0500 02/23/20 0911 02/23/20 1243  BP: (!) 150/97 125/68  126/68  Pulse: 91 86 (!) 199 90  Resp: 19 19  20   Temp: 97.6 F (36.4 C) (!) 97.5 F (36.4 C)  97.8 F (36.6 C)  TempSrc:    Oral  SpO2: 100% 97%  100%  Weight:      Height:        Intake/Output Summary (Last 24 hours) at 02/23/2020 1455 Last data filed at 02/23/2020 0700 Gross per 24 hour  Intake 400 ml  Output 1125 ml  Net -725 ml    01/21 1901 - 01/23 0700 In: 640 [P.O.:240] Out: 2375 [Urine:2375]  Filed Weights   02/18/20 2143  Weight: 83.4 kg    Physical Examination:  General-appears in no acute distress Heart-S1-S2, regular, no murmur auscultated Lungs-clear to auscultation bilaterally, no wheezing or crackles auscultated Abdomen-soft, nontender, no organomegaly Extremities-no edema in the lower extremities Neuro-alert, oriented x3, no focal deficit noted  Status is: Inpatient  Dispo: The patient is from: Home              Anticipated d/c is to: Home              Anticipated d/c date is: 02/24/2020              Patient currently not medically stable for discharge  Barrier to discharge-awaiting bed at nursing facility  Pressure Injury 07/25/19 Sacrum Stage 1 -  Intact skin with non-blanchable redness of a localized area usually over a bony prominence. (Active)  07/25/19 1800  Location: Sacrum  Location Orientation:   Staging: Stage 1 -  Intact skin with non-blanchable redness of a localized area usually over a bony prominence.  Wound Description (Comments):   Present on Admission: Yes     Pressure Injury Ankle Right;Lateral Stage 2 -  Partial thickness loss of dermis presenting as a shallow open injury with a red, pink wound bed without slough. (Active)     Location: Ankle  Location Orientation: Right;Lateral  Staging: Stage 2 -   Partial thickness loss of dermis presenting as a shallow open injury with a red, pink wound bed without slough.  Wound Description (Comments):   Present on Admission: Yes     Pressure Injury 01/03/20 Sacrum Left Stage 1 -  Intact skin with non-blanchable redness of a localized area usually over a bony prominence. (Active)  01/03/20 1718  Location: Sacrum  Location Orientation: Left  Staging: Stage 1 -  Intact skin with non-blanchable redness of a localized area usually over a bony prominence.  Wound Description (Comments):   Present on Admission: Yes           Data Reviewed:   Recent Results (from the past 240 hour(s))  SARS CORONAVIRUS 2 (TAT 6-24 HRS) Nasopharyngeal Nasopharyngeal Swab     Status: None   Collection Time: 02/18/20 10:45 PM   Specimen: Nasopharyngeal Swab  Result Value Ref Range Status   SARS Coronavirus 2 NEGATIVE NEGATIVE Final    Comment: (NOTE) SARS-CoV-2 target nucleic acids are NOT DETECTED.  The SARS-CoV-2 RNA is generally detectable in upper and lower respiratory specimens during the acute phase of infection. Negative results do not preclude SARS-CoV-2 infection, do not rule out co-infections with other pathogens, and should not be used as the sole basis for treatment or other patient management decisions. Negative results must be combined with clinical observations, patient history, and epidemiological information. The expected result is Negative.  Fact Sheet for Patients: SugarRoll.be  Fact Sheet for Healthcare Providers: https://www.woods-mathews.com/  This test is not yet approved or cleared by the [place] FDA and  has been authorized for detection and/or diagnosis of SARS-CoV-2 by FDA under an Emergency Use Authorization (EUA). This EUA will remain  in effect (meaning this test can be used) for the duration of the COVID-19 declaration under Se ction 564(b)(1) of the Act, 21 U.S.C. section  360bbb-3(b)(1), unless the authorization is terminated or revoked sooner.  Performed at [facility], [place] [address] [place], [place] [address]   MRSA PCR Screening     Status: None   Collection Time: 02/20/20  6:27 AM   Specimen: Nasal Mucosa; Nasopharyngeal  Result Value Ref Range Status   MRSA by PCR NEGATIVE NEGATIVE Final    Comment:        The GeneXpert MRSA Assay (FDA approved for NASAL specimens only), is one component of a comprehensive MRSA colonization surveillance program. It is not intended to diagnose MRSA infection nor to guide or monitor treatment for MRSA infections. Performed at [facility], [place] [address], [place],  [address]     Recent Labs  Lab 02/18/20 2245  LIPASE 19   BNP (last 3 results) Recent Labs    04/28/19 0134  BNP 261.4*     [place]   [facility] If 7PM-7AM, please contact night-coverage at [email or web address], Office  [phone]   02/23/2020, 2:55 PM  LOS: 4 days
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
## 2020-02-24 DIAGNOSIS — Z7189 Other specified counseling: Secondary | ICD-10-CM | POA: Diagnosis not present

## 2020-02-24 DIAGNOSIS — Z515 Encounter for palliative care: Secondary | ICD-10-CM | POA: Diagnosis not present

## 2020-02-24 DIAGNOSIS — K625 Hemorrhage of anus and rectum: Secondary | ICD-10-CM | POA: Diagnosis not present

## 2020-02-24 DIAGNOSIS — E876 Hypokalemia: Secondary | ICD-10-CM | POA: Diagnosis not present

## 2020-02-24 LAB — BASIC METABOLIC PANEL
Anion gap: 7 (ref 5–15)
BUN: 17 mg/dL (ref 8–23)
CO2: 23 mmol/L (ref 22–32)
Calcium: 7.9 mg/dL — ABNORMAL LOW (ref 8.9–10.3)
Chloride: 106 mmol/L (ref 98–111)
Creatinine, Ser: 0.81 mg/dL (ref 0.61–1.24)
GFR, Estimated: >60 mL/min (ref >60)
Glucose, Bld: 117 mg/dL — ABNORMAL HIGH (ref 70–99)
Potassium: 3.4 mmol/L — ABNORMAL LOW (ref 3.5–5.1)
Sodium: 136 mmol/L (ref 135–145)

## 2020-02-24 LAB — GLUCOSE, CAPILLARY
Glucose-Capillary: 108 mg/dL — ABNORMAL HIGH (ref 70–99)
Glucose-Capillary: 158 mg/dL — ABNORMAL HIGH (ref 70–99)
Glucose-Capillary: 183 mg/dL — ABNORMAL HIGH (ref 70–99)
Glucose-Capillary: 118 mg/dL — ABNORMAL HIGH (ref 70–99)

## 2020-02-24 LAB — MAGNESIUM: Magnesium: 1.7 mg/dL (ref 1.7–2.4)

## 2020-02-24 LAB — SARS CORONAVIRUS 2 (TAT 6-24 HRS): SARS Coronavirus 2: NEGATIVE

## 2020-02-24 LAB — SURGICAL PATHOLOGY

## 2020-02-24 MED ORDER — POTASSIUM CHLORIDE CRYS ER 20 MEQ PO TBCR
40.0000 meq | EXTENDED_RELEASE_TABLET | Freq: Once | ORAL | Status: AC
  Administered 2020-02-24: 40 meq via ORAL
  Filled 2020-02-24: qty 2

## 2020-02-24 NOTE — Progress Notes (Signed)
PMT brief progress note  Patient on his cell phone, no distress, chart reviewed, discussed with bedside nursing staff.   BP 126/78 (BP Location: Left Arm)   Pulse 84   Temp 97.6 F (36.4 C)   Resp 19   Ht 6\' 2"  (1.88 m)   Wt 83.4 kg   SpO2 99%   BMI 23.61 kg/m  Labs and imaging noted on chart.   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  Recommend palliative care follow up at patient's facility, he came from [facility]. Hopefully patient can benefit from ongoing palliative support, worry that patient will need hospice support, at the moment, patient declines hospice, is accepting of palliative care, as discussed at time of initial consultation. No new PMT specific recommendations at this time.  Greater than 50%  of this time was spent counseling and coordinating care related to the above assessment and plan. 15 minutes spent. Loistine Chance MD [place] palliative.

## 2020-02-24 NOTE — TOC Progression Note (Addendum)
Transition of Care (TOC) - Progression Note    Patient Details  Name: Clarence Andrews. MRN: [id number] Date of Birth: January 02, 1951  Transition of Care [facility]) CM/SW Contact  Ross Ludwig, [place] Phone Number: 02/24/2020, 9:38 AM  Clinical Narrative:    CSW was informed by physician that patient does not want to return back to SNF.  CSW explained to him that unfortunately there are not any other facilities that are available to accept patient.  CSW informed him that because of the current pandemic, there are even more beds limited for patients especially since his primary payor source is Medicaid.  Patient expressed understanding, and stated well if I can't switch I will return back.  CSW advised that he talk with the social worker at the facility to express his concerns that he has.  Patient stated that he will try to talk with them again.  CSW informed [facility] that patient will be ready for return today, they said patient will have to have another Covid.  CSW notified attending physician and bedside nurse that Covid test needs to be completed again.  Referral to [facility] to follow patient at SNF completed by informing Cheri at [place].   Expected Discharge Plan: [place] Barriers to Discharge: Continued Medical Work up  Expected Discharge Plan and Services Expected Discharge Plan: [place] Choice: [place] arrangements for the past 2 months: [place]                                       Social Determinants of Health (SDOH) Interventions    Readmission Risk Interventions Readmission Risk Prevention Plan 01/06/2020  Transportation Screening Complete  Medication Review Press photographer) Complete  PCP or Specialist appointment within 3-5 days of discharge Complete  HRI or Home Care Consult Complete  SW Recovery Care/Counseling Consult  Complete  Palliative Care Screening Complete  Skilled Nursing Facility Complete

## 2020-02-24 NOTE — Progress Notes (Signed)
[facility]  PROGRESS NOTE  [name] PPI:[id number] DOB: 08-24-1950 DOA: 02/18/2020 PCP: Patient, No Pcp Per   Brief HPI:   70 year old male with history of left-sided colon cancer, came to ED with complaints of rectal bleeding.  Patient history of aspiration pneumonia, CAD, CABG, colitis, diabetes mellitus type 2, hypertension, previous COVID-19 infection came back to hospital with multiple episodes of rectal bleeding.  Hemoglobin is down to 7.1.  CT angiogram of the abdomen showed no evidence of GI bleeding.   Subjective   Patient seen and examined, denies any complaints.   Assessment/Plan:     1. Rectal bleeding-likely from recurrence of rectal cancer, flexible sigmoidoscopy done shows ulcerated mass in the rectosigmoid.  Mass was biopsied.  As per GI patient is not a good surgical candidate and chemotherapy is has been stopped in the past due to progression of disease.  There are not many options left for the tumor and palliative care/hospice is the best option for her at this time.  Appreciate palliative care input.  Patient wants to go to skilled nursing facility with palliative care follow-up. 2. Left colon cancer-follows oncology as outpatient.  Chemotherapy stopped due to progression of disease. 3. Anemia-secondary rectal bleeding, hemoglobin is 7.7 after he received 1 unit PRBC.  Follow CBC in a.m.   4. Hypomagnesemia-replete, today magnesium is 1.7. 5. Hypokalemia-potassium is 3.4, will give additional potassium today and follow BMP in am. 6. Atrial fibrillation RVR--anticoagulation on hold due to rectal bleeding.  Heart rate is controlled, continue metoprolol, digoxin. 7. History of systolic CHF-patient is currently euvolemic.   COVID-19 Labs  No results for input(s): DDIMER, FERRITIN, LDH, CRP in the last 72 hours.  Lab Results  Component Value Date   SARSCOV2NAA NEGATIVE 02/18/2020   SARSCOV2NAA NEGATIVE 01/07/2020   [place] NEGATIVE 01/03/2020    [place] NEGATIVE 12/11/2019     Scheduled medications:   . Chlorhexidine Gluconate Cloth  6 each Topical Daily  . digoxin  0.25 mg Oral Daily  . insulin aspart  0-5 Units Subcutaneous QHS  . insulin aspart  0-9 Units Subcutaneous TID WC  . liver oil-zinc oxide   Topical Q8H  . pantoprazole  20 mg Oral BID AC  . sodium chloride flush  10-40 mL Intracatheter Q12H         CBG: Recent Labs  Lab 02/23/20 1134 02/23/20 1615 02/23/20 2140 02/24/20 0746 02/24/20 1123  GLUCAP 143* 135* 151* 108* 158*    SpO2: 99 %    CBC: Recent Labs  Lab 02/18/20 2245 02/18/20 2258 02/19/20 1345 02/20/20 0106 02/20/20 1830 02/21/20 0506 02/21/20 2000 02/22/20 0500  WBC 9.8   < > 8.5 6.8 7.1 6.6  --  7.1  NEUTROABS 6.9  --   --   --   --   --   --   --   HGB 7.1*   < > 7.7* 7.4* 7.4* 6.7* 7.7* 7.7*  HCT 23.7*   < > 24.7* 23.9* 24.3* 22.0* 24.7* 24.9*  MCV 85.6   < > 84.0 84.5 85.0 84.6  --  84.7  PLT 225   < > 227 205 239 190  --  194   < > = values in this interval not displayed.    Basic Metabolic Panel: Recent Labs  Lab 02/19/20 0508 02/20/20 0314 02/21/20 0506 02/22/20 0500 02/23/20 0335 02/24/20 0335  NA 140 141 140 139 134* 136  K 2.8* 3.6 3.4* 3.2* 3.4* 3.4*  CL 108 112* 110 108  105 106  CO2 24 22 23 23 27 23   GLUCOSE 171* 129* 191* 110* 107* 117*  BUN 21 26* 22 15 13 17   CREATININE 0.90 1.06 0.79 0.62 0.82 0.81  CALCIUM 7.8* 8.1* 8.0* 7.5* 7.6* 7.9*  MG 1.3*  --   --   --  1.6* 1.7     Liver Function Tests: Recent Labs  Lab 02/18/20 2245 02/19/20 0508 02/22/20 0500  AST 16 16 15   ALT 12 11 10   ALKPHOS 172* 163* 134*  BILITOT 0.8 0.9 0.6  PROT 5.6* 5.4* 4.9*  ALBUMIN 2.3* 2.2* 2.1*     Antibiotics: Anti-infectives (From admission, onward)   None       DVT prophylaxis: SCDs  Code Status: DNR  Family Communication: No family at bedside   Consultants:  Gastroenterology  Procedures:      Objective   Vitals:   02/23/20  1243 02/23/20 2044 02/24/20 0538 02/24/20 0855  BP: 126/68 112/75 126/78   Pulse: 90 94 83 84  Resp: 20 19 19    Temp: 97.8 F (36.6 C) 98 F (36.7 C) 97.6 F (36.4 C)   TempSrc: Oral     SpO2: 100% 100% 99%   Weight:      Height:        Intake/Output Summary (Last 24 hours) at 02/24/2020 1211 Last data filed at 02/24/2020 1000 Gross per 24 hour  Intake 410 ml  Output 930 ml  Net -520 ml    01/22 1901 - 01/24 0700 In: 170 [P.O.:120] Out: 1455 [Urine:1455]  Filed Weights   02/18/20 2143  Weight: 83.4 kg    Physical Examination:  General-appears in no acute distress Heart-S1-S2, regular, no murmur auscultated Lungs-clear to auscultation bilaterally, no wheezing or crackles auscultated Abdomen-soft, nontender, no organomegaly Extremities-no edema in the lower extremities Neuro-alert, oriented x3, no focal deficit noted  Status is: Inpatient  Dispo: The patient is from: Home              Anticipated d/c is to: Home              Anticipated d/c date is: 02/25/2020              Patient currently  medically stable for discharge  Barrier to discharge-awaiting bed at nursing facility  Pressure Injury 07/25/19 Sacrum Stage 1 -  Intact skin with non-blanchable redness of a localized area usually over a bony prominence. (Active)  07/25/19 1800  Location: Sacrum  Location Orientation:   Staging: Stage 1 -  Intact skin with non-blanchable redness of a localized area usually over a bony prominence.  Wound Description (Comments):   Present on Admission: Yes     Pressure Injury Ankle Right;Lateral Stage 2 -  Partial thickness loss of dermis presenting as a shallow open injury with a red, pink wound bed without slough. (Active)     Location: Ankle  Location Orientation: Right;Lateral  Staging: Stage 2 -  Partial thickness loss of dermis presenting as a shallow open injury with a red, pink wound bed without slough.  Wound Description (Comments):   Present on Admission: Yes      Pressure Injury 01/03/20 Sacrum Left Stage 1 -  Intact skin with non-blanchable redness of a localized area usually over a bony prominence. (Active)  01/03/20 1718  Location: Sacrum  Location Orientation: Left  Staging: Stage 1 -  Intact skin with non-blanchable redness of a localized area usually over a bony prominence.  Wound Description (Comments):  Present on Admission: Yes           Data Reviewed:   Recent Results (from the past 240 hour(s))  SARS CORONAVIRUS 2 (TAT 6-24 HRS) Nasopharyngeal Nasopharyngeal Swab     Status: None   Collection Time: 02/18/20 10:45 PM   Specimen: Nasopharyngeal Swab  Result Value Ref Range Status   SARS Coronavirus 2 NEGATIVE NEGATIVE Final    Comment: (NOTE) SARS-CoV-2 target nucleic acids are NOT DETECTED.  The SARS-CoV-2 RNA is generally detectable in upper and lower respiratory specimens during the acute phase of infection. Negative results do not preclude SARS-CoV-2 infection, do not rule out co-infections with other pathogens, and should not be used as the sole basis for treatment or other patient management decisions. Negative results must be combined with clinical observations, patient history, and epidemiological information. The expected result is Negative.  Fact Sheet for Patients: SugarRoll.be  Fact Sheet for Healthcare Providers: https://www.woods-mathews.com/  This test is not yet approved or cleared by the [place] FDA and  has been authorized for detection and/or diagnosis of SARS-CoV-2 by FDA under an Emergency Use Authorization (EUA). This EUA will remain  in effect (meaning this test can be used) for the duration of the COVID-19 declaration under Se ction 564(b)(1) of the Act, 21 U.S.C. section 360bbb-3(b)(1), unless the authorization is terminated or revoked sooner.  Performed at [facility], [place] [address], [place], [place] [address]   MRSA PCR Screening      Status: None   Collection Time: 02/20/20  6:27 AM   Specimen: Nasal Mucosa; Nasopharyngeal  Result Value Ref Range Status   MRSA by PCR NEGATIVE NEGATIVE Final    Comment:        The GeneXpert MRSA Assay (FDA approved for NASAL specimens only), is one component of a comprehensive MRSA colonization surveillance program. It is not intended to diagnose MRSA infection nor to guide or monitor treatment for MRSA infections. Performed at [facility], [place] [address] [place], [place] [address]     Recent Labs  Lab 02/18/20 2245  LIPASE 19   BNP (last 3 results) Recent Labs    04/28/19 0134  BNP 261.4*     [place]   [facility] If 7PM-7AM, please contact night-coverage at [email or web address], Office  [phone]   02/24/2020, 12:11 PM  LOS: 5 days

## 2020-02-24 NOTE — Care Management Important Message (Signed)
Medicare important message printed for Clarence Andrews to give to the patient. 

## 2020-02-25 ENCOUNTER — Inpatient Hospital Stay (HOSPITAL_COMMUNITY): Payer: Medicare Other

## 2020-02-25 DIAGNOSIS — R197 Diarrhea, unspecified: Secondary | ICD-10-CM

## 2020-02-25 DIAGNOSIS — I4891 Unspecified atrial fibrillation: Secondary | ICD-10-CM | POA: Diagnosis not present

## 2020-02-25 DIAGNOSIS — C189 Malignant neoplasm of colon, unspecified: Secondary | ICD-10-CM | POA: Diagnosis not present

## 2020-02-25 DIAGNOSIS — E876 Hypokalemia: Secondary | ICD-10-CM | POA: Diagnosis not present

## 2020-02-25 DIAGNOSIS — K625 Hemorrhage of anus and rectum: Secondary | ICD-10-CM | POA: Diagnosis not present

## 2020-02-25 LAB — CBC
HCT: 24.7 % — ABNORMAL LOW (ref 39.0–52.0)
Hemoglobin: 7.5 g/dL — ABNORMAL LOW (ref 13.0–17.0)
MCH: 26.1 pg (ref 26.0–34.0)
MCHC: 30.4 g/dL (ref 30.0–36.0)
MCV: 86.1 fL (ref 80.0–100.0)
Platelets: 198 10*3/uL (ref 150–400)
RBC: 2.87 MIL/uL — ABNORMAL LOW (ref 4.22–5.81)
RDW: 16.2 % — ABNORMAL HIGH (ref 11.5–15.5)
WBC: 6.2 10*3/uL (ref 4.0–10.5)
nRBC: 0 % (ref 0.0–0.2)

## 2020-02-25 LAB — BASIC METABOLIC PANEL
Anion gap: 7 (ref 5–15)
BUN: 16 mg/dL (ref 8–23)
CO2: 22 mmol/L (ref 22–32)
Calcium: 8 mg/dL — ABNORMAL LOW (ref 8.9–10.3)
Chloride: 108 mmol/L (ref 98–111)
Creatinine, Ser: 0.85 mg/dL (ref 0.61–1.24)
GFR, Estimated: >60 mL/min (ref >60)
Glucose, Bld: 129 mg/dL — ABNORMAL HIGH (ref 70–99)
Potassium: 3.6 mmol/L (ref 3.5–5.1)
Sodium: 137 mmol/L (ref 135–145)

## 2020-02-25 LAB — GLUCOSE, CAPILLARY
Glucose-Capillary: 120 mg/dL — ABNORMAL HIGH (ref 70–99)
Glucose-Capillary: 160 mg/dL — ABNORMAL HIGH (ref 70–99)
Glucose-Capillary: 134 mg/dL — ABNORMAL HIGH (ref 70–99)
Glucose-Capillary: 130 mg/dL — ABNORMAL HIGH (ref 70–99)

## 2020-02-25 MED ORDER — LOPERAMIDE HCL 2 MG PO CAPS
2.0000 mg | ORAL_CAPSULE | Freq: Four times a day (QID) | ORAL | Status: DC | PRN
  Administered 2020-02-25: 2 mg via ORAL
  Filled 2020-02-25: qty 1

## 2020-02-25 NOTE — Progress Notes (Signed)
Chaplain engaged in initial visit with Clarence Glaser "[name] voiced that he is not afraid of death.  He is looking forward to having a reunion with his loved ones that have already passed on such as his grandmother who lived to be 54.  She was a great part of his life and he looks forward to seeing her again, as well as his parents, siblings, and friends.  While Clarence Andrews does not fear death, he is having trouble leaving behind his children and grandchildren.  The thought of not being there for them as they continue to grow creates a lot of grief for him.  He also wanted to be able to leave things behind for them to make their futures better.  Clarence Andrews wanted to be able to give them more than what he has right now.  Chaplain and Clarence Andrews talked about ways that he can leave things behind for them that don't include possessions or finances.  Chaplain and Clarence Andrews discussed letters, words, or videos as possible options as well.    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  He stated that as a boy he was always enamored with church and learning more about God.  Chaplain could assess the importance and significance of his faith to him.  He holds his death as minor in comparison to the death that [name] suffered of crucifixion.  He also seems to pull a lot of strength from [name]' life and death.    Clarence Andrews discussed some of the hard times that he has endured such as divorce and the ways in which that greatly impacted his life.  Clarence Andrews talked about forgiveness with chaplain and having to extend that to those that took advantage of him during his cancer diagnosis.  Clarence Andrews brought up Jesus' forgiveness on the cross.   Chaplain and Clarence Andrews went over Scientist, physiological.  The AD is ready for notarization.  Chaplains are working to complete it and will continue to follow-up.    Chaplain offered ministries of presence, listening, and prayer with Clarence Andrews.      02/25/20 1300  Clinical Encounter  Type  Visited With Patient  Visit Type Initial

## 2020-02-25 NOTE — Progress Notes (Signed)
[facility]  PROGRESS NOTE  [name] [id number] DOB: 1950-05-23 DOA: 02/18/2020 PCP: Patient, No Pcp Per   Brief HPI:   70 year old male with history of left-sided colon cancer, came to ED with complaints of rectal bleeding.  Patient history of aspiration pneumonia, CAD, CABG, colitis, diabetes mellitus type 2, hypertension, previous COVID-19 infection came back to hospital with multiple episodes of rectal bleeding.  Hemoglobin is down to 7.1.  CT angiogram of the abdomen showed no evidence of GI bleeding.   Subjective   Patient seen and examined, developed diarrhea last night.  Had 4 loose BMs.   Assessment/Plan:     1. Rectal bleeding-likely from recurrence of rectal cancer, flexible sigmoidoscopy done shows ulcerated mass in the rectosigmoid.  Mass was biopsied.  As per GI patient is not a good surgical candidate and chemotherapy is has been stopped in the past due to progression of disease.  There are not many options left for the tumor and palliative care/hospice is the best option for her at this time.  Appreciate palliative care input.  Patient wants to go to skilled nursing facility with palliative care follow-up. 2. Diarrhea-patient had loose BMs x4 since last night.  X-ray abdomen obtained shows colonic stent in place, no obstruction.  Discussed with GI PA, very low suspicion for C. difficile colitis.  He does not have fever, WBC is normal.  Will start Imodium 2 mg every 8 hours as needed for diarrhea. 3. Left colon cancer-follows oncology as outpatient.  Chemotherapy stopped due to progression of disease. 4. Anemia-secondary rectal bleeding, hemoglobin is 7.5, today he received 1 unit PRBC.  Follow CBC in a.m.   5. Hypomagnesemia-replete, today magnesium is 1.7. 6. Hypokalemia-replete 7. Atrial fibrillation RVR--anticoagulation on hold due to rectal bleeding.  Heart rate is controlled, continue metoprolol, digoxin. 8. History of systolic CHF-patient is currently  euvolemic.   COVID-19 Labs  No results for input(s): DDIMER, FERRITIN, LDH, CRP in the last 72 hours.  Lab Results  Component Value Date   SARSCOV2NAA NEGATIVE 02/24/2020   SARSCOV2NAA NEGATIVE 02/18/2020   [place] NEGATIVE 01/07/2020   [place] NEGATIVE 01/03/2020     Scheduled medications:   . Chlorhexidine Gluconate Cloth  6 each Topical Daily  . digoxin  0.25 mg Oral Daily  . insulin aspart  0-5 Units Subcutaneous QHS  . insulin aspart  0-9 Units Subcutaneous TID WC  . liver oil-zinc oxide   Topical Q8H  . pantoprazole  20 mg Oral BID AC  . sodium chloride flush  10-40 mL Intracatheter Q12H         CBG: Recent Labs  Lab 02/24/20 1123 02/24/20 1737 02/24/20 2104 02/25/20 0720 02/25/20 1204  GLUCAP 158* 183* 118* 120* 160*    SpO2: 100 %    CBC: Recent Labs  Lab 02/18/20 2245 02/18/20 2258 02/20/20 0106 02/20/20 1830 02/21/20 0506 02/21/20 2000 02/22/20 0500 02/25/20 1113  WBC 9.8   < > 6.8 7.1 6.6  --  7.1 6.2  NEUTROABS 6.9  --   --   --   --   --   --   --   HGB 7.1*   < > 7.4* 7.4* 6.7* 7.7* 7.7* 7.5*  HCT 23.7*   < > 23.9* 24.3* 22.0* 24.7* 24.9* 24.7*  MCV 85.6   < > 84.5 85.0 84.6  --  84.7 86.1  PLT 225   < > 205 239 190  --  194 198   < > = values in  this interval not displayed.    Basic Metabolic Panel: Recent Labs  Lab 02/19/20 0508 02/20/20 0314 02/21/20 0506 02/22/20 0500 02/23/20 0335 02/24/20 0335 02/25/20 0306  NA 140   < > 140 139 134* 136 137  K 2.8*   < > 3.4* 3.2* 3.4* 3.4* 3.6  CL 108   < > 110 108 105 106 108  CO2 24   < > 23 23 27 23 22   GLUCOSE 171*   < > 191* 110* 107* 117* 129*  BUN 21   < > 22 15 13 17 16   CREATININE 0.90   < > 0.79 0.62 0.82 0.81 0.85  CALCIUM 7.8*   < > 8.0* 7.5* 7.6* 7.9* 8.0*  MG 1.3*  --   --   --  1.6* 1.7  --    < > = values in this interval not displayed.     Liver Function Tests: Recent Labs  Lab 02/18/20 2245 02/19/20 0508 02/22/20 0500  AST 16 16 15   ALT 12 11  10   ALKPHOS 172* 163* 134*  BILITOT 0.8 0.9 0.6  PROT 5.6* 5.4* 4.9*  ALBUMIN 2.3* 2.2* 2.1*     Antibiotics: Anti-infectives (From admission, onward)   None      DVT prophylaxis: SCDs  Code Status: DNR  Family Communication: No family at bedside   Consultants:  Gastroenterology     Objective   Vitals:   02/24/20 1436 02/24/20 1523 02/24/20 2122 02/25/20 0443  BP: (!) 144/85 (!) 144/83 133/80 130/69  Pulse: 89 87 86 83  Resp: 17  19 20   Temp: 98.3 F (36.8 C) 97.9 F (36.6 C) 98 F (36.7 C) (!) 97.5 F (36.4 C)  TempSrc:  Oral    SpO2: 100% 95% 100% 100%  Weight:      Height:        Intake/Output Summary (Last 24 hours) at 02/25/2020 1404 Last data filed at 02/25/2020 0200 Gross per 24 hour  Intake 1619.89 ml  Output 250 ml  Net 1369.89 ml    01/23 1901 - 01/25 0700 In: 1979.9 [P.O.:480; I.V.:1499.9] Out: 1280 [Urine:1280]  Filed Weights   02/18/20 2143  Weight: 83.4 kg    Physical Examination:  General-appears in no acute distress Heart-S1-S2, regular, no murmur auscultated Lungs-clear to auscultation bilaterally, no wheezing or crackles auscultated Abdomen-soft, nontender, no organomegaly Extremities-no edema in the lower extremities Neuro-alert, oriented x3, no focal deficit noted  Status is: Inpatient  Dispo: The patient is from: Home              Anticipated d/c is to: Skilled nursing facility with palliative care follow-up              Anticipated d/c date is: 02/26/2020              Patient currently not medically stable for discharge  Barrier to discharge-ongoing diarrhea  Pressure Injury 07/25/19 Sacrum Stage 1 -  Intact skin with non-blanchable redness of a localized area usually over a bony prominence. (Active)  07/25/19 1800  Location: Sacrum  Location Orientation:   Staging: Stage 1 -  Intact skin with non-blanchable redness of a localized area usually over a bony prominence.  Wound Description (Comments):   Present on  Admission: Yes     Pressure Injury Ankle Right;Lateral Stage 2 -  Partial thickness loss of dermis presenting as a shallow open injury with a red, pink wound bed without slough. (Active)     Location: Ankle  Location Orientation: Right;Lateral  Staging: Stage 2 -  Partial thickness loss of dermis presenting as a shallow open injury with a red, pink wound bed without slough.  Wound Description (Comments):   Present on Admission: Yes     Pressure Injury 01/03/20 Sacrum Left Stage 1 -  Intact skin with non-blanchable redness of a localized area usually over a bony prominence. (Active)  01/03/20 1718  Location: Sacrum  Location Orientation: Left  Staging: Stage 1 -  Intact skin with non-blanchable redness of a localized area usually over a bony prominence.  Wound Description (Comments):   Present on Admission: Yes           Data Reviewed:   Recent Results (from the past 240 hour(s))  SARS CORONAVIRUS 2 (TAT 6-24 HRS) Nasopharyngeal Nasopharyngeal Swab     Status: None   Collection Time: 02/18/20 10:45 PM   Specimen: Nasopharyngeal Swab  Result Value Ref Range Status   SARS Coronavirus 2 NEGATIVE NEGATIVE Final    Comment: (NOTE) SARS-CoV-2 target nucleic acids are NOT DETECTED.  The SARS-CoV-2 RNA is generally detectable in upper and lower respiratory specimens during the acute phase of infection. Negative results do not preclude SARS-CoV-2 infection, do not rule out co-infections with other pathogens, and should not be used as the sole basis for treatment or other patient management decisions. Negative results must be combined with clinical observations, patient history, and epidemiological information. The expected result is Negative.  Fact Sheet for Patients: SugarRoll.be  Fact Sheet for Healthcare Providers: https://www.woods-mathews.com/  This test is not yet approved or cleared by the [place] FDA and  has been  authorized for detection and/or diagnosis of SARS-CoV-2 by FDA under an Emergency Use Authorization (EUA). This EUA will remain  in effect (meaning this test can be used) for the duration of the COVID-19 declaration under Se ction 564(b)(1) of the Act, 21 U.S.C. section 360bbb-3(b)(1), unless the authorization is terminated or revoked sooner.  Performed at [facility], [place] [address] [place], [place] [address]   MRSA PCR Screening     Status: None   Collection Time: 02/20/20  6:27 AM   Specimen: Nasal Mucosa; Nasopharyngeal  Result Value Ref Range Status   MRSA by PCR NEGATIVE NEGATIVE Final    Comment:        The GeneXpert MRSA Assay (FDA approved for NASAL specimens only), is one component of a comprehensive MRSA colonization surveillance program. It is not intended to diagnose MRSA infection nor to guide or monitor treatment for MRSA infections. Performed at [facility], [place] [address], [place], [place] [address]   SARS CORONAVIRUS 2 (TAT 6-24 HRS) Nasopharyngeal Nasopharyngeal Swab     Status: None   Collection Time: 02/24/20 11:24 AM   Specimen: Nasopharyngeal Swab  Result Value Ref Range Status   SARS Coronavirus 2 NEGATIVE NEGATIVE Final    Comment: (NOTE) SARS-CoV-2 target nucleic acids are NOT DETECTED.  The SARS-CoV-2 RNA is generally detectable in upper and lower respiratory specimens during the acute phase of infection. Negative results do not preclude SARS-CoV-2 infection, do not rule out co-infections with other pathogens, and should not be used as the sole basis for treatment or other patient management decisions. Negative results must be combined with clinical observations, patient history, and epidemiological information. The expected result is Negative.  Fact Sheet for Patients: SugarRoll.be  Fact Sheet for Healthcare Providers: https://www.woods-mathews.com/  This test is not yet  approved or cleared by the [place] FDA  and  has been authorized for detection and/or diagnosis of SARS-CoV-2 by FDA under an Emergency Use Authorization (EUA). This EUA will remain  in effect (meaning this test can be used) for the duration of the COVID-19 declaration under Se ction 564(b)(1) of the Act, 21 U.S.C. section 360bbb-3(b)(1), unless the authorization is terminated or revoked sooner.  Performed at [facility], [place] [address], [place], [place] [address]     Recent Labs  Lab 02/18/20 2245  LIPASE 19   BNP (last 3 results) Recent Labs    04/28/19 0134  BNP 261.4*     [place]   [facility] If 7PM-7AM, please contact night-coverage at [email or web address], Office  [phone]   02/25/2020, 2:04 PM  LOS: 6 days

## 2020-02-25 NOTE — Plan of Care (Signed)
  Problem: Education: Goal: Knowledge of General Education information will improve Description Including pain rating scale, medication(s)/side effects and non-pharmacologic comfort measures Outcome: Progressing   Problem: Clinical Measurements: Goal: Will remain free from infection Outcome: Progressing Goal: Respiratory complications will improve Outcome: Progressing Goal: Cardiovascular complication will be avoided Outcome: Progressing   

## 2020-02-26 DIAGNOSIS — K625 Hemorrhage of anus and rectum: Secondary | ICD-10-CM | POA: Diagnosis not present

## 2020-02-26 LAB — GLUCOSE, CAPILLARY
Glucose-Capillary: 141 mg/dL — ABNORMAL HIGH (ref 70–99)
Glucose-Capillary: 149 mg/dL — ABNORMAL HIGH (ref 70–99)

## 2020-02-26 MED ORDER — PANTOPRAZOLE SODIUM 20 MG PO TBEC: 20.0000 mg | DELAYED_RELEASE_TABLET | Freq: Two times a day (BID) | ORAL | 0 refills | Status: AC

## 2020-02-26 MED ORDER — HEPARIN SOD (PORK) LOCK FLUSH 100 UNIT/ML IV SOLN
500.0000 [IU] | Freq: Once | INTRAVENOUS | Status: AC
  Administered 2020-02-26: 500 [IU] via INTRAVENOUS
  Filled 2020-02-26: qty 5

## 2020-02-26 NOTE — TOC Progression Note (Signed)
Transition of Care (TOC) - Progression Note    Patient Details  Name: Clarence Andrews. MRN: [id number] Date of Birth: [birth year]  [facility]) CM/SW Contact  Ross Ludwig, [place] Phone Number: 02/26/2020, 2:33 PM  Clinical Narrative:     CSW was informed by physician, that patient is not medically ready for discharge today, possibly tomorrow.  CSW contacted Arbie Cookey at [employer], and informed her of the update for discharge plan.  CSW to continue to follow patient's progress throughout discharge planning.   Expected Discharge Plan: [place] Barriers to Discharge: Barriers Resolved  Expected Discharge Plan and Services Expected Discharge Plan: [place] Choice: [place] Living arrangements for the past 2 months: [place] Expected Discharge Date: 02/26/20                                     Social Determinants of Health (SDOH) Interventions    Readmission Risk Interventions Readmission Risk Prevention Plan 01/06/2020  Transportation Screening Complete  Medication Review Press photographer) Complete  PCP or Specialist appointment within 3-5 days of discharge Complete  HRI or Home Care Consult Complete  SW Recovery Care/Counseling Consult Complete  Palliative Care Screening Complete  Skilled Nursing Facility Complete

## 2020-02-26 NOTE — Progress Notes (Addendum)
Received consult from [employer].  Consulted with A/c, Jinny Sanders, to find notary resources for Mr Ervine.  Spiritual care will work to find witnesses and notary to complete HCPOA today

## 2020-02-26 NOTE — TOC Transition Note (Addendum)
Transition of Care [facility]) - CM/SW Discharge Note   Patient Details  Name: Clarence Andrews. MRN: [id number] Date of Birth: [birth year]  Transition of Care [facility]) CM/SW Contact:  Ross Ludwig, LCSW Phone Number:  02/26/2020, 1:06 PM   Clinical Narrative:     Patient to be d/c'ed today to [facility] room 107.  Patient and family agreeable to plans will transport via ems RN to call report to [phone].  CSW notified patient that he is discharging today.  Palliative recommended to follow at SNF, CSW updated [facility], that patient is discharging today.      Final next level of care: Skilled Nursing Facility Barriers to Discharge: Barriers Resolved   Patient Goals and CMS Choice Patient states their goals for this hospitalization and ongoing recovery are:: To return back to SNF CMS Medicare.gov Compare Post Acute Care list provided to:: Patient Choice offered to / list presented to : Patient  Discharge Placement   Existing PASRR number confirmed : 02/21/20          Patient chooses bed at: [facility] Patient to be transferred to facility by: [facility]   Patient and family notified of of transfer: 02/26/20  Discharge Plan and Services     Post Acute Care Choice: [place]                               Social Determinants of Health (SDOH) Interventions     Readmission Risk Interventions Readmission Risk Prevention Plan 01/06/2020  Transportation Screening Complete  Medication Review Press photographer) Complete  PCP or Specialist appointment within 3-5 days of discharge Complete  HRI or Home Care Consult Complete  SW Recovery Care/Counseling Consult Complete  Palliative Care Screening Complete  Skilled Nursing Facility Complete

## 2020-02-26 NOTE — Plan of Care (Signed)
  Problem: Clinical Measurements: Goal: Ability to maintain clinical measurements within normal limits will improve Outcome: Progressing Goal: Diagnostic test results will improve Outcome: Progressing   Problem: Activity: Goal: Risk for activity intolerance will decrease Outcome: Progressing   Problem: Coping: Goal: Level of anxiety will decrease Outcome: Progressing   Problem: Skin Integrity: Goal: Risk for impaired skin integrity will decrease Outcome: Progressing   

## 2020-02-26 NOTE — Discharge Summary (Addendum)
Physician Discharge Summary  [name] TKW:[id number] DOB: 11/29/1950 DOA: 02/18/2020  PCP: Patient, No Pcp Per  Admit date: 02/18/2020 Discharge date: 02/26/2020  Time spent: 26 minutes  Recommendations for Outpatient Follow-up:  1. Recommend palliative care follow the patient in the outpatient setting and further goals of care as he is a poor candidate for any advanced therapies 2. Recommend CBC and Chem-12 in 1 week 3. 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 Rush Landmark 1 has been taken care of by Dr. Lavella Hammock has had 3 hospitalizations including 02/02/2019 Admitted on 1/19 with multiple episodes of melena from facility hemoglobin is down to 7.1 CT angiogram did not show any bleeding Patient was transfused 1 unit of blood and seen by gastroenterology GI saw in consult and recommended hospice although patient is only interested in palliative care at this point He had no further bleeding and his hemoglobin is stabilized and 7.1 range Palliative care was recommended to follow him in the outpatient setting  This hospitalization was complicated by diarrhea this was not felt to be C. difficile related He has A. fib RVR which was stable and he is not on anticoagulation He was stabilized for  discharge 1/26 and will return to nursing facility   Discharge Exam: Vitals:   02/26/20 0346 02/26/20 0908  BP: 134/77   Pulse: 85 89  Resp: 18   Temp: 97.7 F (36.5 C)   SpO2: 100%     General: Awake alert coherent no distress EOMI NCAT no focal deficit Cardiovascular: S1-S2 no murmur no rub no gallop Respiratory: Clear no added sound Abdomen soft no rebound no guarding For the  Discharge Instructions   Discharge Instructions    Diet - low sodium heart healthy   Complete by: As directed    Increase activity slowly   Complete by: As directed    No wound care   Complete by: As directed      Allergies as of 02/26/2020   No Known Allergies     Medication List    STOP taking these medications   acetaminophen 325 MG tablet Commonly known as: TYLENOL   apixaban 2.5 MG Tabs tablet Commonly known as: ELIQUIS   ascorbic acid 500 MG tablet Commonly known as: VITAMIN C   bisacodyl 10 MG suppository Commonly known as: DULCOLAX   calcium carbonate 1500 (600 Ca) MG Tabs tablet Commonly known as: OSCAL   D3-1000 PO   D[place] Oint   digoxin 0.25 MG tablet Commonly known as: LANOXIN   docusate sodium 100 MG capsule Commonly known as: COLACE   feeding supplement (PRO-STAT 64) Liqd   ferrous sulfate 325 (65 FE) MG EC tablet   hyoscyamine 0.125 MG tablet Commonly known as: LEVSIN   levalbuterol 45 MCG/ACT inhaler Commonly known as: Penne Lash  HFA   magic mouthwash Soln   magnesium oxide 400 MG tablet Commonly known as: MAG-OX   omeprazole 20 MG capsule Commonly known as: PriLOSEC   oxyCODONE-acetaminophen 5-325 MG tablet Commonly known as: PERCOCET/ROXICET   polyethylene glycol 17 g packet Commonly known as: MIRALAX / GLYCOLAX   PROPYLENE GLYCOL OP     TAKE these medications   loperamide 2 MG capsule Commonly known as: IMODIUM Take 1 capsule by mouth 4 (four) times daily as needed for diarrhea or loose stools.   metoprolol tartrate 25 MG  tablet Commonly known as: LOPRESSOR Take 1 tablet (25 mg total) by mouth daily as needed (for Heart rate >120).   midodrine 10 MG tablet Commonly known as: PROAMATINE Take 1 tablet (10 mg total) by mouth 3 (three) times daily as needed (orthostatic. SBP<90).   pantoprazole 20 MG tablet Commonly known as: PROTONIX Take 1 tablet (20 mg total) by mouth 2 (two) times daily before a meal.      No Known Allergies    The results of significant diagnostics from this hospitalization (including imaging, microbiology, ancillary and laboratory) are listed below for reference.    Significant Diagnostic Studies: DG Abd 2 Views  Result Date: 02/25/2020 CLINICAL DATA:  History of colon carcinoma with rectal bleeding EXAM: ABDOMEN - 2 VIEW COMPARISON:  07/13/2019, 02/19/2020 FINDINGS: Scattered large and small bowel gas is noted. No abnormal mass or abnormal calcifications are seen. Colonic stent is noted. No obstructive changes are seen. No free air is noted. IMPRESSION: Colonic stent in place.  No obstructive changes are noted. Electronically Signed   By: Inez Catalina M.D.   On: 02/25/2020 12:28   CT Angio Abd/Pel W and/or Wo Contrast  Result Date: 02/19/2020 CLINICAL DATA:  GI bleed EXAM: CTA ABDOMEN AND PELVIS WITHOUT AND WITH CONTRAST TECHNIQUE: Multidetector CT imaging of the abdomen and pelvis was performed using the standard protocol during bolus administration of intravenous contrast. Multiplanar reconstructed images and MIPs were obtained and reviewed to evaluate the vascular anatomy. CONTRAST:  18mL OMNIPAQUE IOHEXOL 350 MG/ML SOLN COMPARISON:  January 03, 2020 FINDINGS: VASCULAR Aorta: Normal caliber aorta without aneurysm, dissection, vasculitis or significant stenosis. Celiac: Patent without evidence of aneurysm, dissection, vasculitis or significant stenosis. SMA: Patent without evidence of aneurysm, dissection, vasculitis or significant stenosis. There is a replaced right hepatic artery, a  normal variant. Renals: Both renal arteries are patent without evidence of aneurysm, dissection, vasculitis, fibromuscular dysplasia or significant stenosis. There are 2 renal arteries on the right. IMA: Patent without evidence of aneurysm, dissection, vasculitis or significant stenosis. Inflow: Patent without evidence of aneurysm, dissection, vasculitis or significant stenosis. Proximal Outflow: Bilateral common femoral and visualized portions of the superficial and profunda femoral arteries are patent without evidence of aneurysm, dissection, vasculitis or significant stenosis. Veins: No obvious venous abnormality within the limitations of this arterial phase study. Review of the MIP images confirms the above findings. NON-VASCULAR Lower chest: The lung bases are clear. The heart size is normal. 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 Normal gallbladder.There is no biliary ductal dilation. Pancreas: Again noted is a cystic lesion at the head of the pancreas, stable from prior study. Spleen: Unremarkable. Adrenals/Urinary Tract: --Adrenal glands: Unremarkable. --Right kidney/ureter: No hydronephrosis or radiopaque kidney stones. --Left kidney/ureter: No hydronephrosis or radiopaque kidney stones. --Urinary bladder: There is asymmetric bladder wall thickening involving the posterior bladder wall Stomach/Bowel: --Stomach/Duodenum: The stomach is distended. --Small bowel: Unremarkable. --Colon: A rectal stent is again noted. There is a  large amount of stool in the colon. --Appendix: Not visualized. No right lower quadrant inflammation or free fluid. Lymphatic: --No retroperitoneal lymphadenopathy. --No mesenteric lymphadenopathy. --No pelvic or inguinal lymphadenopathy. Reproductive: Unremarkable Other: There is no free air. There is a trace amount of free fluid in the patient's pelvis. Musculoskeletal. No acute displaced fractures. IMPRESSION: 1. No acute vascular abnormality.  No evidence for GI  bleeding. 2. Asymmetric bladder wall thickening involving the posterior bladder wall. This may be secondary to post treatment changes of the patient's rectal cancer. 3. Again noted are findings consistent with the patient's history of metastatic colorectal carcinoma. Aortic Atherosclerosis (ICD10-I70.0). Electronically Signed   By: Constance Holster M.D.   On: 02/19/2020 00:42    Microbiology: Recent Results (from the past 240 hour(s))  SARS CORONAVIRUS 2 (TAT 6-24 HRS) Nasopharyngeal Nasopharyngeal Swab     Status: None   Collection Time: 02/18/20 10:45 PM   Specimen: Nasopharyngeal Swab  Result Value Ref Range Status   SARS Coronavirus 2 NEGATIVE NEGATIVE Final    Comment: (NOTE) SARS-CoV-2 target nucleic acids are NOT DETECTED.  The SARS-CoV-2 RNA is generally detectable in upper and lower respiratory specimens during the acute phase of infection. Negative results do not preclude SARS-CoV-2 infection, do not rule out co-infections with other pathogens, and should not be used as the sole basis for treatment or other patient management decisions. Negative results must be combined with clinical observations, patient history, and epidemiological information. The expected result is Negative.  Fact Sheet for Patients: SugarRoll.be  Fact Sheet for Healthcare Providers: https://www.woods-mathews.com/  This test is not yet approved or cleared by the [place] FDA and  has been authorized for detection and/or diagnosis of SARS-CoV-2 by FDA under an Emergency Use Authorization (EUA). This EUA will remain  in effect (meaning this test can be used) for the duration of the COVID-19 declaration under Se ction 564(b)(1) of the Act, 21 U.S.C. section 360bbb-3(b)(1), unless the authorization is terminated or revoked sooner.  Performed at [facility], [place] [address], [place], [place] [address]   MRSA PCR Screening     Status: None    Collection Time: 02/20/20  6:27 AM   Specimen: Nasal Mucosa; Nasopharyngeal  Result Value Ref Range Status   MRSA by PCR NEGATIVE NEGATIVE Final    Comment:        The GeneXpert MRSA Assay (FDA approved for NASAL specimens only), is one component of a comprehensive MRSA colonization surveillance program. It is not intended to diagnose MRSA infection nor to guide or monitor treatment for MRSA infections. Performed at [facility], [place] [address], [place], [place] [address]   SARS CORONAVIRUS 2 (TAT 6-24 HRS) Nasopharyngeal Nasopharyngeal Swab     Status: None   Collection Time: 02/24/20 11:24 AM   Specimen: Nasopharyngeal Swab  Result Value Ref Range Status   SARS Coronavirus 2 NEGATIVE NEGATIVE Final    Comment: (NOTE) SARS-CoV-2 target nucleic acids are NOT DETECTED.  The SARS-CoV-2 RNA is generally detectable in upper and lower respiratory specimens during the acute phase of infection. Negative results do not preclude SARS-CoV-2 infection, do not rule out co-infections with other pathogens, and should not be used as the sole basis for treatment or other patient management decisions. Negative results must be combined with clinical observations, patient history, and epidemiological information. The expected result is Negative.  Fact Sheet for Patients: SugarRoll.be  Fact Sheet for Healthcare Providers: https://www.woods-mathews.com/  This test is not yet approved or cleared  by the [place] and  has been authorized for detection and/or diagnosis of SARS-CoV-2 by FDA under an Emergency Use Authorization (EUA). This EUA will remain  in effect (meaning this test can be used) for the duration of the COVID-19 declaration under Se ction 564(b)(1) of the Act, 21 U.S.C. section 360bbb-3(b)(1), unless the authorization is terminated or revoked sooner.  Performed at [facility], [place] [address],  [place], [place] [address]      Labs: Basic Metabolic Panel: Recent Labs  Lab 02/21/20 0506 02/22/20 0500 02/23/20 0335 02/24/20 0335 02/25/20 0306  NA 140 139 134* 136 137  K 3.4* 3.2* 3.4* 3.4* 3.6  CL 110 108 105 106 108  CO2 23 23 27 23 22   GLUCOSE 191* 110* 107* 117* 129*  BUN 22 15 13 17 16   CREATININE 0.79 0.62 0.82 0.81 0.85  CALCIUM 8.0* 7.5* 7.6* 7.9* 8.0*  MG  --   --  1.6* 1.7  --    Liver Function Tests: Recent Labs  Lab 02/22/20 0500  AST 15  ALT 10  ALKPHOS 134*  BILITOT 0.6  PROT 4.9*  ALBUMIN 2.1*   No results for input(s): LIPASE, AMYLASE in the last 168 hours. No results for input(s): AMMONIA in the last 168 hours. CBC: Recent Labs  Lab 02/20/20 0106 02/20/20 1830 02/21/20 0506 02/21/20 2000 02/22/20 0500 02/25/20 1113  WBC 6.8 7.1 6.6  --  7.1 6.2  HGB 7.4* 7.4* 6.7* 7.7* 7.7* 7.5*  HCT 23.9* 24.3* 22.0* 24.7* 24.9* 24.7*  MCV 84.5 85.0 84.6  --  84.7 86.1  PLT 205 239 190  --  194 198   Cardiac Enzymes: No results for input(s): CKTOTAL, CKMB, CKMBINDEX, TROPONINI in the last 168 hours. BNP: BNP (last 3 results) Recent Labs    04/28/19 0134  BNP 261.4*    ProBNP (last 3 results) No results for input(s): PROBNP in the last 8760 hours.  CBG: Recent Labs  Lab 02/25/20 0720 02/25/20 1204 02/25/20 1649 02/25/20 2102 02/26/20 0802  GLUCAP 120* 160* 134* 130* 141*       Signed:  Nita Sells MD   [facility] 02/26/2020, 11:15 AM

## 2020-06-23 ENCOUNTER — Telehealth: Payer: Self-pay

## 2020-07-01 NOTE — Telephone Encounter (Signed)
V/M message from [facility] at [facility] informing DrBenay Spice that Clarence Andrews passed away today Jul 19, 2020 at 1225 pm

## 2020-07-01 DEATH — deceased

## 2020-08-14 ENCOUNTER — Encounter: Payer: Self-pay | Admitting: Gastroenterology

## 2021-11-15 IMAGING — DX DG CHEST 1V PORT
2 series · 2 of 2 positions shown · non-contrast
Comparison: Chest x-ray dated July 09, 2019.

CLINICAL DATA: Weakness with leukocytosis.

EXAM:
PORTABLE CHEST 1 VIEW

[chest ap (1 of 2)]
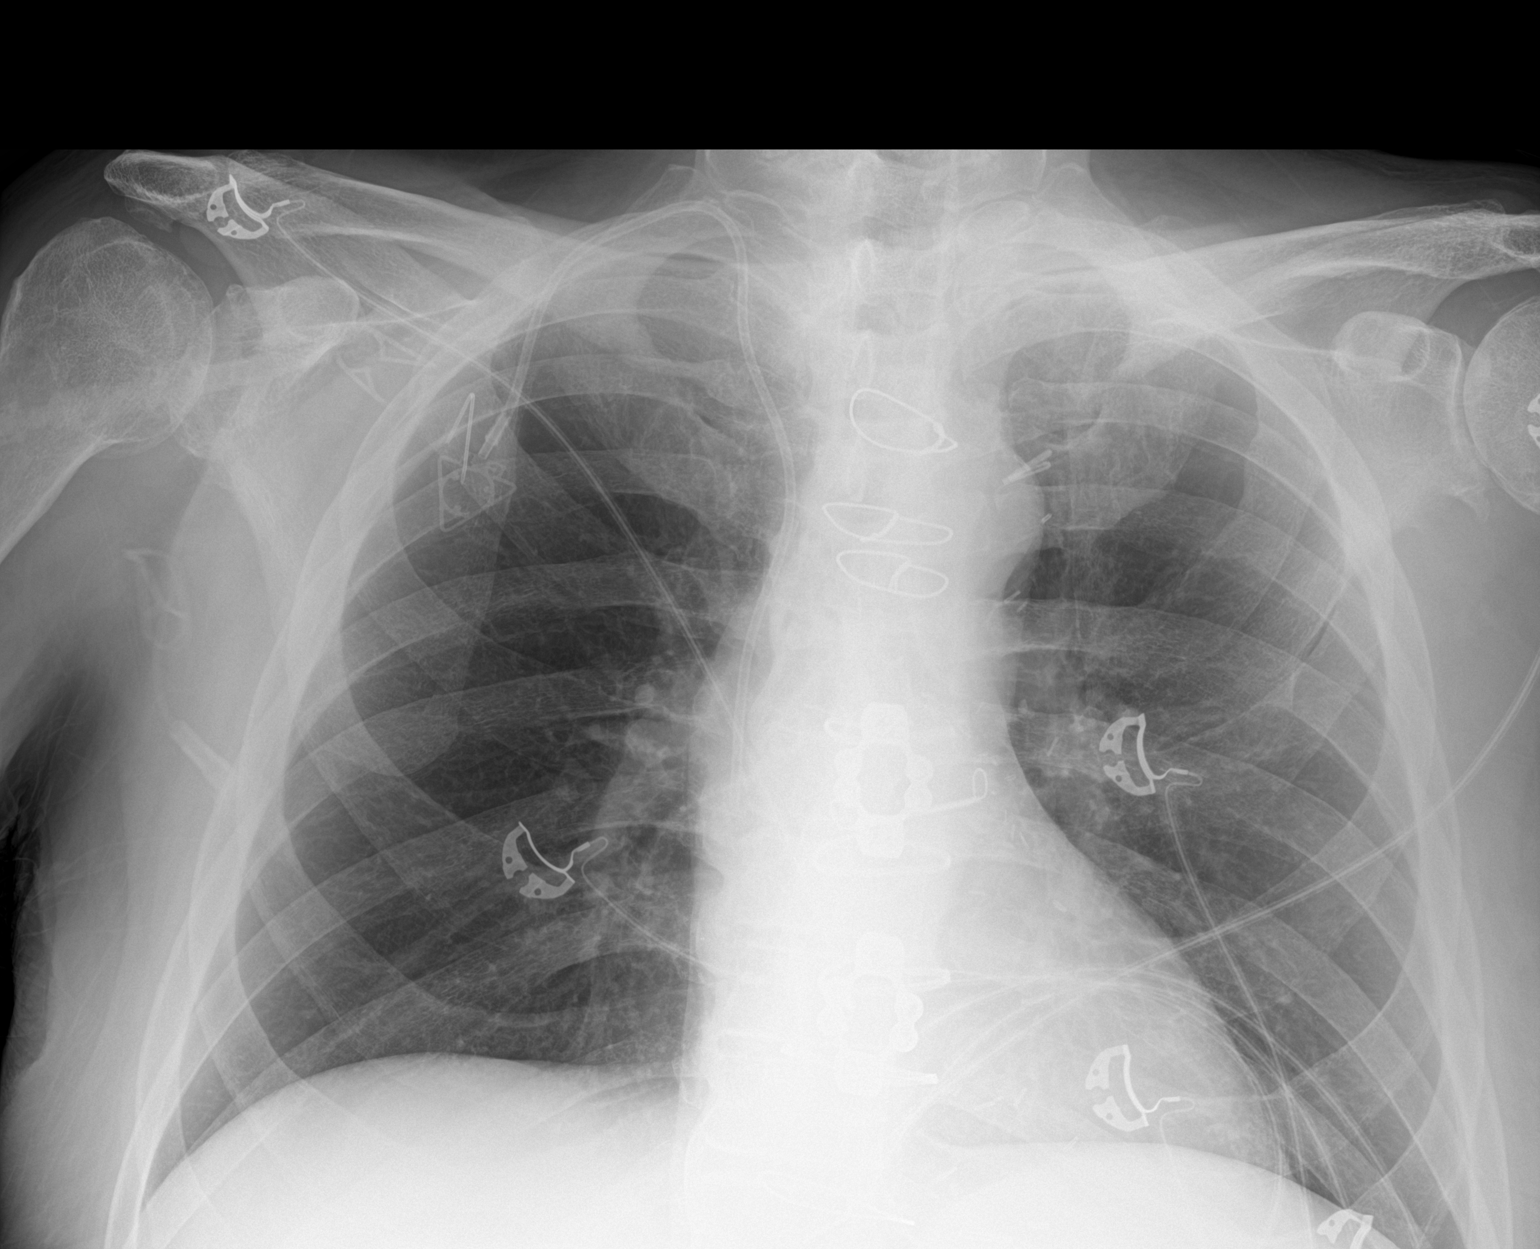

[chest ap (2 of 2)]
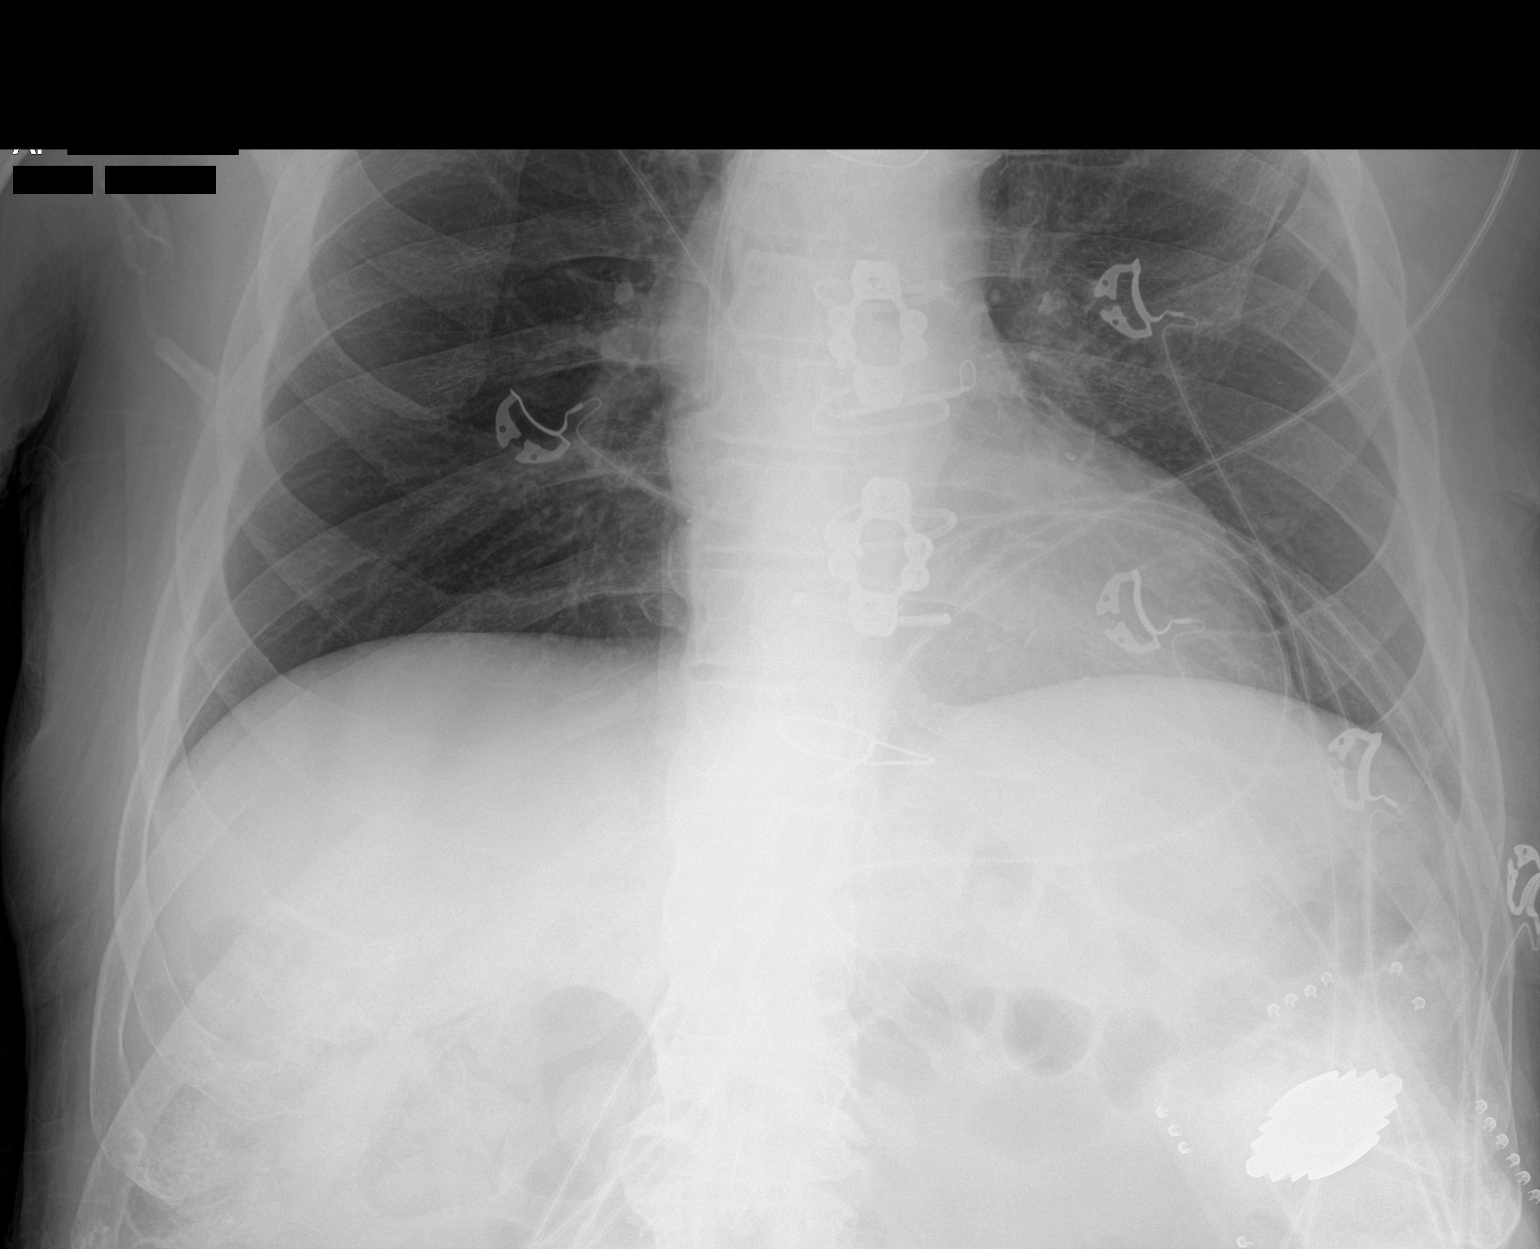

[2 of 2 positions shown; findings below may reference images not displayed]

FINDINGS: Unchanged right chest wall port catheter. The heart size and
mediastinal contours are within normal limits. Prior CABG and left
atrial appendage clipping. Normal pulmonary vascularity. No focal
consolidation, pleural effusion, or pneumothorax. No acute osseous
abnormality.
IMPRESSION: No active disease.

## 2022-02-09 IMAGING — CT CT ABD-PELV W/ CM
2 of 5 series · 13 of 46 positions shown, 15 images · IV contrast (omnipaque)
Comparison: CT the abdomen and pelvis 09/11/2019.

CLINICAL DATA: 69-year-old male with history of colon cancer.
Restaging examination. Ongoing chemotherapy.

EXAM:
CT ABDOMEN AND PELVIS WITH CONTRAST
TECHNIQUE: Multidetector CT imaging of the abdomen and pelvis was performed
using the standard protocol following bolus administration of
intravenous contrast.
CONTRAST:  100mL OMNIPAQUE IOHEXOL 300 MG/ML  SOLN

[Series 2: axial st · axial · 0.87mm/px · z∈[-484,-4]mm · 10 of 114 slices shown, 12 images]
[im 9/114  soft-tissue]
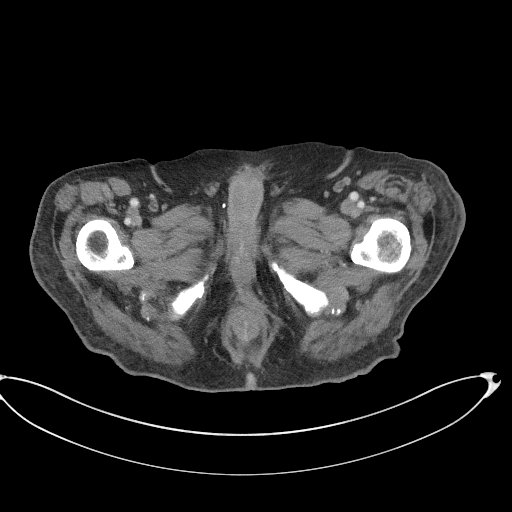
[im 9/114  bone]
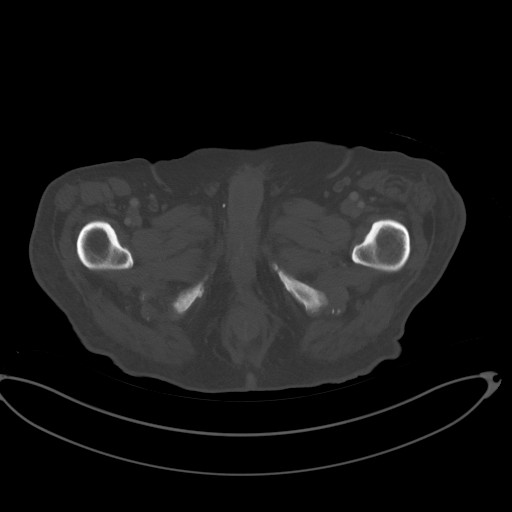
[im 17/114  soft-tissue]
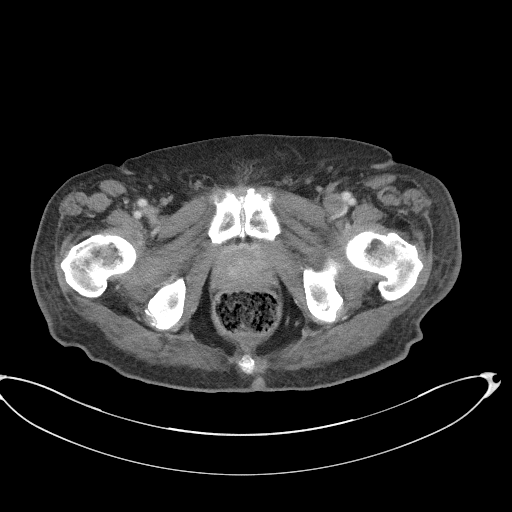
[im 33/114  soft-tissue]
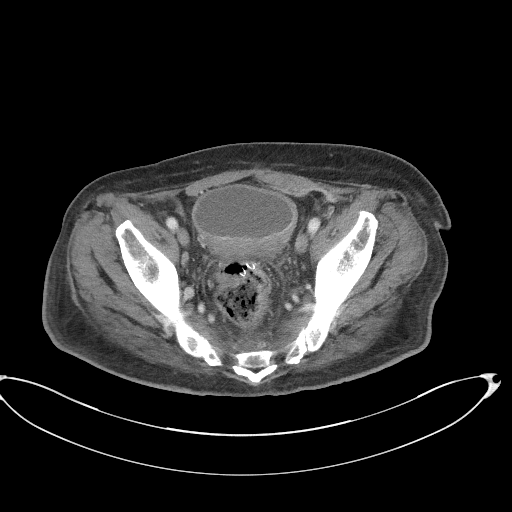
[im 41/114  soft-tissue]
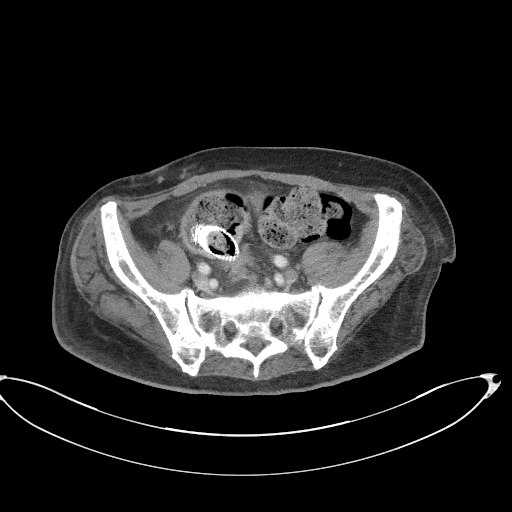
[im 49/114  soft-tissue]
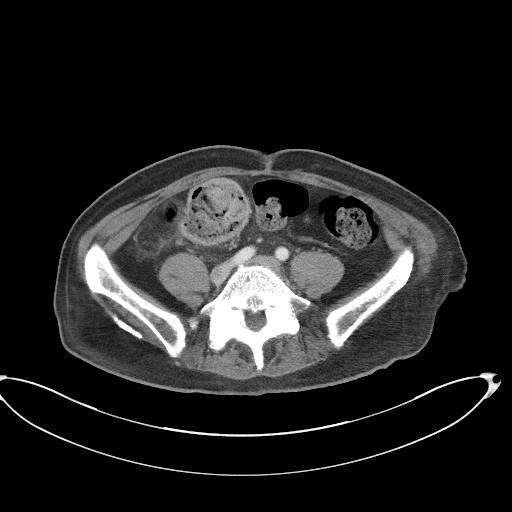
[im 65/114  soft-tissue]
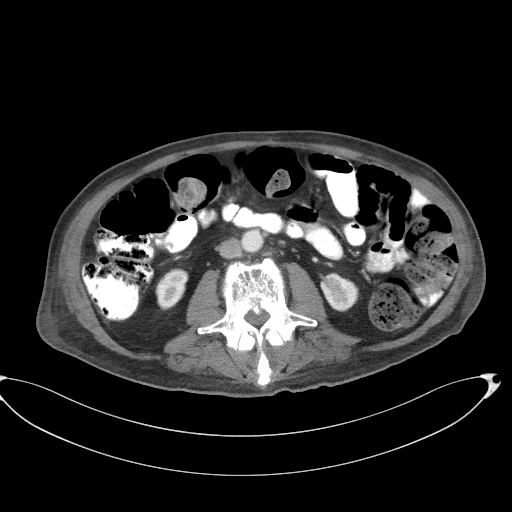
[im 73/114  soft-tissue]
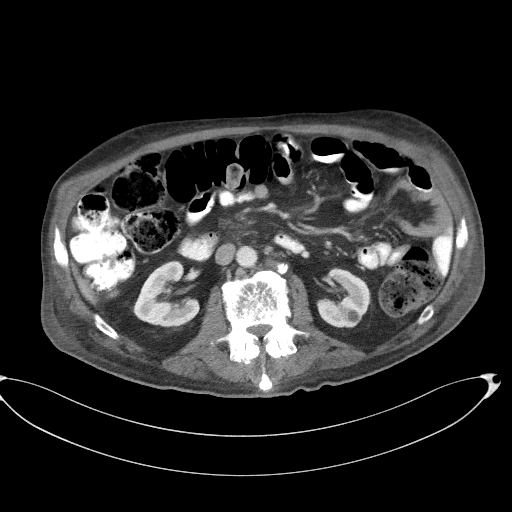
[im 81/114  soft-tissue]
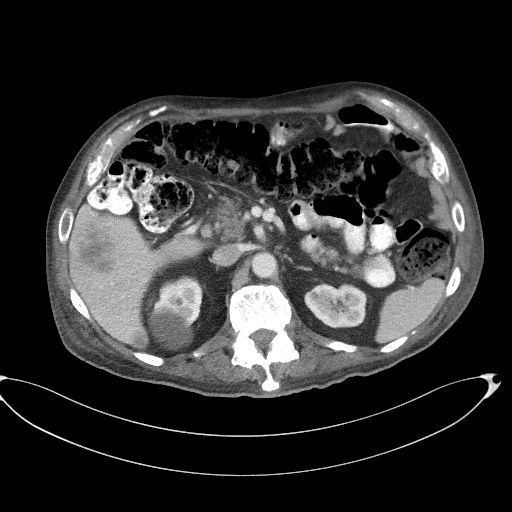
[im 97/114  soft-tissue]
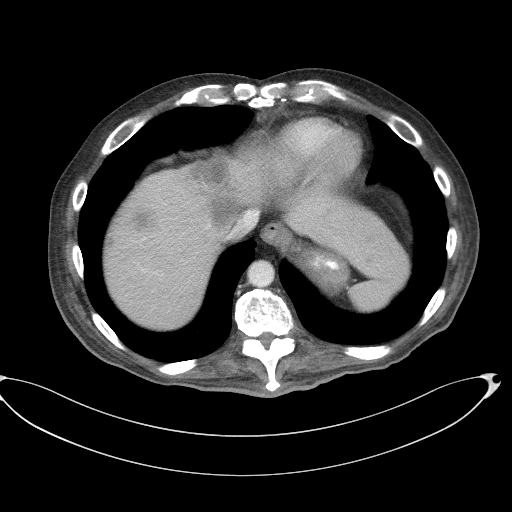
[im 97/114  bone]
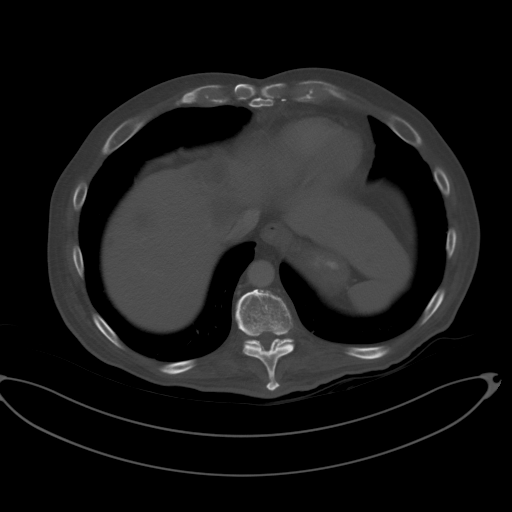
[im 105/114  soft-tissue]
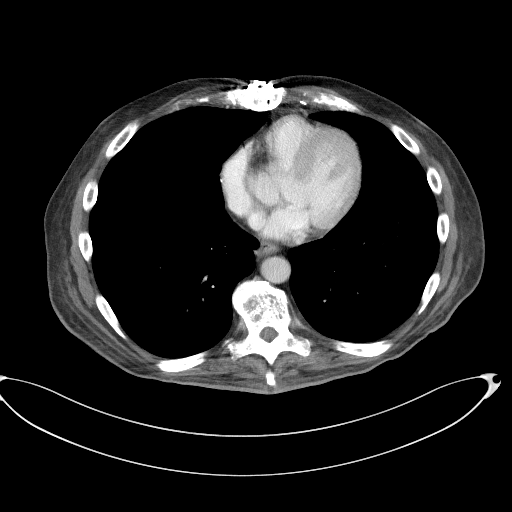

[Series 5: coronal st · coronal · 0.81mm/px · 3 of 101 slices shown]
[im 34/101  soft-tissue]
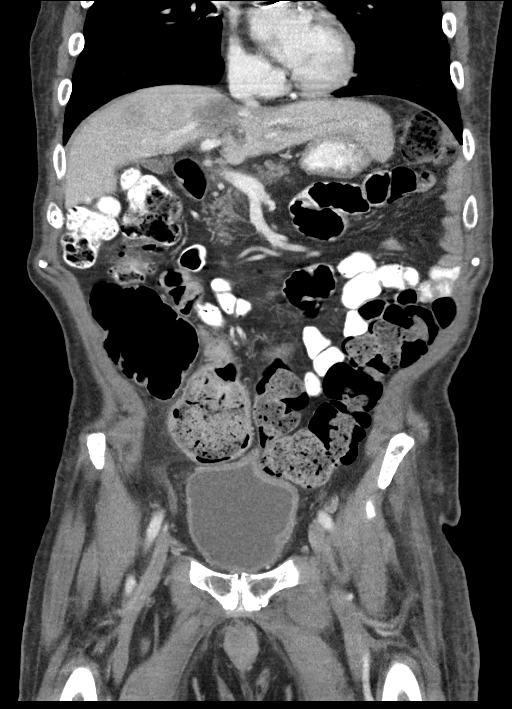
[im 45/101  soft-tissue]
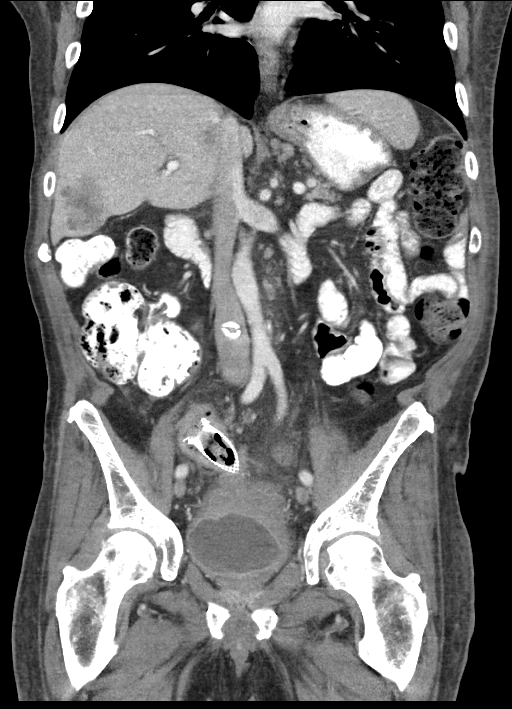
[im 56/101  soft-tissue]
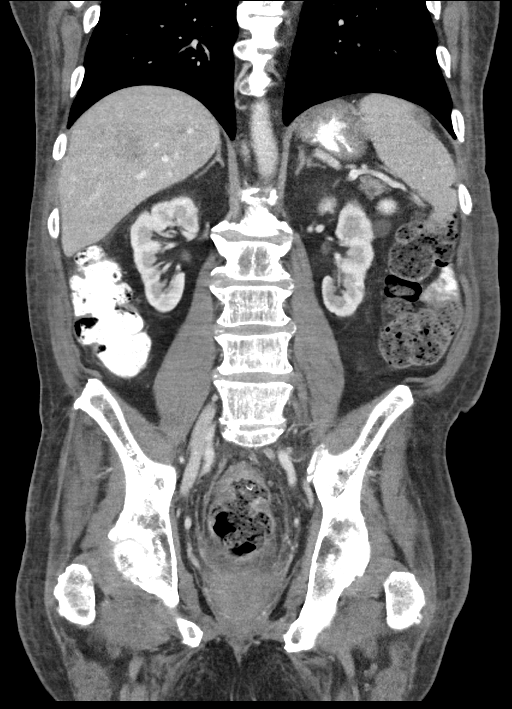

[13 of 46 positions shown; findings below may reference images not displayed]

FINDINGS: Lower chest: Left atrial appendage ligation clip. Atherosclerotic
calcifications in left anterior descending, left circumflex and
right coronary arteries. Postoperative changes of median sternotomy
for CABG, including [REDACTED] to the LAD. Calcified granulomas in the
anterior aspect of the left upper lobe.

Hepatobiliary: Multiple hypovascular hepatic lesions appear
increased in number and size compared to the prior examination. The
largest of these lesions is in the central aspect of the liver
predominantly in segment 4A (axial image 21 of series 2), currently
measuring 6.6 x 5.5 cm (previously 5.7 x 3.9 cm). Another prominent
lesion in the inferior aspect of the right lobe of the liver between
segments 5 and 6 (axial image 33 of series 2), currently 5.4 x
cm (previously 3.5 x 3.3 cm. Several small new lesions are also
noted in segment 2 (axial image 22 of series 2), central aspect of
the left lobe of the liver between segments 2 and 3 (axial image 23
of series 2), segment 8 (axial image 16 of series 2), and segment 7
(axial image 24 of series 2). No intra or extrahepatic biliary
ductal dilatation. Tiny calcified gallstones in the neck of the
gallbladder. No findings to suggest an acute cholecystitis at this
time.

Pancreas: 1.4 x 1.0 cm low-attenuation lesion in the head of the
pancreas (image 35 of series 2), stable compared to the prior study,
too small to characterize. No other pancreatic mass. No pancreatic
ductal dilatation. No peripancreatic fluid collections or
inflammatory changes.

Spleen: Unremarkable.

Adrenals/Urinary Tract: Multiple low-attenuation lesions in both
kidneys, compatible with simple cysts, largest of which is exophytic
in the posterior aspect of the upper pole the right kidney measuring
5.5 x 4.1 cm. Other subcentimeter low-attenuation lesions in both
kidneys are too small to definitively characterize, but
statistically likely to represent tiny cysts. Bilateral adrenal
glands are normal in appearance. No hydroureteronephrosis.
Asymmetric mural thickening of the urinary bladder wall, most severe
posteriorly where there also appears to be some increased urothelial
enhancement, measuring up to 1.4 cm in thickness (sagittal image 70
of series 6).

Stomach/Bowel: The appearance of the stomach is normal. No
pathologic dilatation of small bowel or colon. Stent traversing an
area of stenosis in the distal sigmoid colon. The appendix is not
confidently identified and may be surgically absent. Regardless,
there are no inflammatory changes noted adjacent to the cecum to
suggest the presence of an acute appendicitis at this time.

Vascular/Lymphatic: Mild atherosclerotic disease in the abdominal
aorta. No aneurysm or dissection noted in the abdominal or pelvic
vasculature. Borderline enlarged peripancreatic lymph node (axial
image 34 of series 2), stable compared to prior examination. No
other definite pathologically enlarged lymph nodes are noted in the
abdomen or pelvis.

Reproductive: Prostate gland and seminal vesicles are unremarkable
in appearance.

Other: No significant volume of ascites.  No pneumoperitoneum.

Musculoskeletal: There are no aggressive appearing lytic or blastic
lesions noted in the visualized portions of the skeleton.
IMPRESSION: 1. Progressive metastatic disease to the liver with increased number
and size of numerous hypovascular lesions, as detailed above.
2. No other sites of extra hepatic metastatic disease confidently
identified in the abdomen or pelvis.
3. Profound mural thickening in the posterior aspect of the urinary
bladder with some urothelial hyperenhancement, increased compared to
the prior examination. This may reflect post treatment changes from
prior radiation therapy, however, with urologic consultation should
be considered for further clinical evaluation.
4. Small cystic lesion in the head of the pancreas, nonspecific, but
stable compared to the prior study and favored to represent a small
pseudocyst or other benign lesion. Attention at time of routine
follow-up imaging is recommended.
5. Stable position of stent in the sigmoid colon, without evidence
of bowel obstruction.
6. Aortic atherosclerosis.
7. Cholelithiasis without evidence of acute cholecystitis at this
time.
8. Additional incidental findings, as above.

## 2022-12-21 NOTE — Telephone Encounter (Signed)
Telephone call
